# Patient Record
Sex: Male | Born: 1965 | Race: White | Hispanic: No | Marital: Married | Smoking: Former smoker
Health system: Southern US, Community
[De-identification: ages and names within clinical notes are randomized; demographics above are authoritative.]

## PROBLEM LIST (undated history)

## (undated) DIAGNOSIS — G4730 Sleep apnea, unspecified: Secondary | ICD-10-CM

## (undated) DIAGNOSIS — E119 Type 2 diabetes mellitus without complications: Principal | ICD-10-CM

## (undated) DIAGNOSIS — R519 Headache, unspecified: Secondary | ICD-10-CM

## (undated) DIAGNOSIS — I2510 Atherosclerotic heart disease of native coronary artery without angina pectoris: Secondary | ICD-10-CM

## (undated) DIAGNOSIS — Z86718 Personal history of other venous thrombosis and embolism: Secondary | ICD-10-CM

## (undated) DIAGNOSIS — C169 Malignant neoplasm of stomach, unspecified: Secondary | ICD-10-CM

## (undated) DIAGNOSIS — J45909 Unspecified asthma, uncomplicated: Secondary | ICD-10-CM

## (undated) DIAGNOSIS — I10 Essential (primary) hypertension: Secondary | ICD-10-CM

## (undated) DIAGNOSIS — I4819 Other persistent atrial fibrillation: Secondary | ICD-10-CM

## (undated) DIAGNOSIS — T7840XA Allergy, unspecified, initial encounter: Secondary | ICD-10-CM

## (undated) DIAGNOSIS — M1990 Unspecified osteoarthritis, unspecified site: Secondary | ICD-10-CM

## (undated) DIAGNOSIS — I499 Cardiac arrhythmia, unspecified: Secondary | ICD-10-CM

## (undated) DIAGNOSIS — I456 Pre-excitation syndrome: Secondary | ICD-10-CM

## (undated) DIAGNOSIS — R51 Headache: Secondary | ICD-10-CM

## (undated) DIAGNOSIS — I4891 Unspecified atrial fibrillation: Secondary | ICD-10-CM

## (undated) DIAGNOSIS — K219 Gastro-esophageal reflux disease without esophagitis: Secondary | ICD-10-CM

## (undated) DIAGNOSIS — E7800 Pure hypercholesterolemia, unspecified: Secondary | ICD-10-CM

## (undated) DIAGNOSIS — D649 Anemia, unspecified: Secondary | ICD-10-CM

## (undated) HISTORY — PX: TONSILLECTOMY: SUR1361

## (undated) HISTORY — PX: STOMACH SURGERY: SHX791

## (undated) HISTORY — DX: Malignant neoplasm of stomach, unspecified: C16.9

## (undated) HISTORY — PX: CORONARY ANGIOPLASTY WITH STENT PLACEMENT: SHX49

## (undated) HISTORY — DX: Allergy, unspecified, initial encounter: T78.40XA

## (undated) HISTORY — DX: Gastro-esophageal reflux disease without esophagitis: K21.9

## (undated) HISTORY — PX: NOSE SURGERY: SHX723

## (undated) HISTORY — DX: Unspecified asthma, uncomplicated: J45.909

## (undated) HISTORY — PX: CARDIAC SURGERY: SHX584

## (undated) HISTORY — PX: CORONARY ANGIOPLASTY: SHX604

## (undated) HISTORY — DX: Other persistent atrial fibrillation: I48.19

## (undated) HISTORY — PX: HERNIA REPAIR: SHX51

## (undated) HISTORY — PX: BACK SURGERY: SHX140

## (undated) HISTORY — PX: SPINE SURGERY: SHX786

## (undated) HISTORY — DX: Type 2 diabetes mellitus without complications: E11.9

---

## 1999-06-06 ENCOUNTER — Ambulatory Visit (HOSPITAL_COMMUNITY): Admission: RE | Admit: 1999-06-06 | Discharge: 1999-06-07 | Payer: Self-pay | Admitting: Internal Medicine

## 2003-08-15 ENCOUNTER — Observation Stay (HOSPITAL_COMMUNITY): Admission: RE | Admit: 2003-08-15 | Discharge: 2003-08-16 | Payer: Self-pay | Admitting: Specialist

## 2004-08-28 ENCOUNTER — Observation Stay (HOSPITAL_COMMUNITY): Admission: RE | Admit: 2004-08-28 | Discharge: 2004-08-29 | Payer: Self-pay | Admitting: Specialist

## 2012-03-09 ENCOUNTER — Encounter (HOSPITAL_COMMUNITY): Payer: Self-pay

## 2012-03-09 ENCOUNTER — Emergency Department (HOSPITAL_COMMUNITY)
Admission: EM | Admit: 2012-03-09 | Discharge: 2012-03-09 | Disposition: A | Discharge status: Home / Self Care | Payer: Worker's Compensation | Attending: Emergency Medicine | Admitting: Emergency Medicine

## 2012-03-09 ENCOUNTER — Emergency Department (HOSPITAL_COMMUNITY): Payer: Worker's Compensation

## 2012-03-09 DIAGNOSIS — X500XXA Overexertion from strenuous movement or load, initial encounter: Secondary | ICD-10-CM | POA: Insufficient documentation

## 2012-03-09 DIAGNOSIS — Y9289 Other specified places as the place of occurrence of the external cause: Secondary | ICD-10-CM | POA: Insufficient documentation

## 2012-03-09 DIAGNOSIS — E7800 Pure hypercholesterolemia, unspecified: Secondary | ICD-10-CM | POA: Insufficient documentation

## 2012-03-09 DIAGNOSIS — Y990 Civilian activity done for income or pay: Secondary | ICD-10-CM | POA: Insufficient documentation

## 2012-03-09 DIAGNOSIS — Y9389 Activity, other specified: Secondary | ICD-10-CM | POA: Insufficient documentation

## 2012-03-09 DIAGNOSIS — M84376A Stress fracture, unspecified foot, initial encounter for fracture: Secondary | ICD-10-CM

## 2012-03-09 DIAGNOSIS — I10 Essential (primary) hypertension: Secondary | ICD-10-CM | POA: Insufficient documentation

## 2012-03-09 HISTORY — DX: Essential (primary) hypertension: I10

## 2012-03-09 HISTORY — DX: Pure hypercholesterolemia, unspecified: E78.00

## 2012-03-09 MED ORDER — OXYCODONE-ACETAMINOPHEN 5-325 MG PO TABS: 1.0000 | ORAL_TABLET | ORAL | Status: AC | PRN

## 2012-03-09 MED ORDER — OXYCODONE-ACETAMINOPHEN 5-325 MG PO TABS
1.0000 | ORAL_TABLET | Freq: Once | ORAL | Status: AC
  Administered 2012-03-09: 1 via ORAL
  Filled 2012-03-09: qty 1

## 2012-03-09 NOTE — ED Notes (Signed)
Workers comp paperwork completed by July idol PA, pt walked to lab area to wait for UDS, lab made aware pt is waiting.

## 2012-03-09 NOTE — ED Notes (Signed)
Pt c/o foot pain after turning to reach for something. Pt felt a "pop" in foot and began to hurt.

## 2012-03-11 NOTE — ED Provider Notes (Signed)
History     CSN: [id number]  Arrival date & time 03/09/12  1956   First MD Initiated Contact with Patient 03/09/12 2026      Chief Complaint  Patient presents with  . Foot Injury    (Consider location/radiation/quality/duration/timing/severity/associated sxs/prior treatment) HPI Comments: Austin Woods presents with left foot pain after feeling a popping sensation in this foot after simply reaching and pivoting at work prior to arrival.    Patient is a 45 y.o. male presenting with foot injury. The history is provided by the patient.  Foot Injury  The incident occurred 1 to 2 hours ago. The incident occurred at work. There was no injury mechanism. The pain is present in the left foot. The quality of the pain is described as sharp and aching. The pain is at a severity of 8/10. The pain is moderate. The pain has been constant since onset. Pertinent negatives include no numbness, no inability to bear weight, no loss of sensation and no tingling. The symptoms are aggravated by bearing weight and palpation. He has tried nothing for the symptoms.    Past Medical History  Diagnosis Date  . Hypertension   . High cholesterol     Past Surgical History  Procedure Date  . Back surgery     No family history on file.  History  Substance Use Topics  . Smoking status: Never Smoker   . Smokeless tobacco: Not on file  . Alcohol Use: No      Review of Systems  Musculoskeletal: Positive for arthralgias. Negative for joint swelling.  Skin: Negative for wound.  Neurological: Negative for tingling, weakness and numbness.    Allergies  Review of patient's allergies indicates no known allergies.  Home Medications   Current Outpatient Rx  Name  Route  Sig  Dispense  Refill  . OXYCODONE-ACETAMINOPHEN 5-325 MG PO TABS   Oral   Take 1 tablet by mouth every 4 (four) hours as needed for pain.   20 tablet   0     BP 134/79  Pulse 60  Temp 98.3 F (36.8 C) (Oral)  Resp 20  Ht 6'  1" (1.854 m)  Wt 280 lb (127.007 kg)  BMI 36.94 kg/m2  SpO2 97%  Physical Exam  Constitutional: He appears well-developed and well-nourished.  HENT:  Head: Atraumatic.  Neck: Normal range of motion.  Cardiovascular:       Pulses equal bilaterally  Musculoskeletal: He exhibits tenderness.       Left foot: He exhibits bony tenderness. He exhibits normal capillary refill and no deformity.       Feet:  Neurological: He is alert. He has normal strength. He displays normal reflexes. No sensory deficit.       Equal strength  Skin: Skin is warm and dry.  Psychiatric: He has a normal mood and affect.    ED Course  Procedures (including critical care time)  Labs Reviewed - No data to display No results found.   1. Metatarsal stress fracture       MDM  Patients labs and/or radiological studies were reviewed during the medical decision making and disposition process.  Pt placed in posterior splint. Crutches given.  Cap refill rechecked after splint applied, remains adequate.  Pain improved.  Oxycodone prescribed.  Referral to Dr Romeo Apple. Pt to call in am for appt.        Burgess Amor, [place] 03/11/12 2231

## 2012-03-13 NOTE — ED Provider Notes (Signed)
Medical screening examination/treatment/procedure(s) were performed by non-physician practitioner and as supervising physician I was immediately available for consultation/collaboration.   Joya Gaskins, MD 03/13/12 [phone]

## 2012-08-05 ENCOUNTER — Observation Stay (HOSPITAL_COMMUNITY)
Admission: EM | Admit: 2012-08-05 | Discharge: 2012-08-06 | Disposition: A | Discharge status: Home / Self Care | Payer: BC Managed Care – PPO | Attending: Cardiology | Admitting: Cardiology

## 2012-08-05 ENCOUNTER — Emergency Department (HOSPITAL_COMMUNITY): Payer: BC Managed Care – PPO

## 2012-08-05 ENCOUNTER — Encounter: Payer: Self-pay | Admitting: Nurse Practitioner

## 2012-08-05 ENCOUNTER — Encounter (HOSPITAL_COMMUNITY): Payer: Self-pay | Admitting: Emergency Medicine

## 2012-08-05 ENCOUNTER — Ambulatory Visit (INDEPENDENT_AMBULATORY_CARE_PROVIDER_SITE_OTHER): Payer: BC Managed Care – PPO | Admitting: Nurse Practitioner

## 2012-08-05 VITALS — BP 138/82 | HR 70 | Wt 284.0 lb

## 2012-08-05 DIAGNOSIS — M7989 Other specified soft tissue disorders: Secondary | ICD-10-CM | POA: Insufficient documentation

## 2012-08-05 DIAGNOSIS — R002 Palpitations: Secondary | ICD-10-CM | POA: Insufficient documentation

## 2012-08-05 DIAGNOSIS — Z86718 Personal history of other venous thrombosis and embolism: Secondary | ICD-10-CM | POA: Insufficient documentation

## 2012-08-05 DIAGNOSIS — E669 Obesity, unspecified: Secondary | ICD-10-CM | POA: Insufficient documentation

## 2012-08-05 DIAGNOSIS — R609 Edema, unspecified: Secondary | ICD-10-CM

## 2012-08-05 DIAGNOSIS — I4891 Unspecified atrial fibrillation: Principal | ICD-10-CM

## 2012-08-05 DIAGNOSIS — I10 Essential (primary) hypertension: Secondary | ICD-10-CM | POA: Insufficient documentation

## 2012-08-05 DIAGNOSIS — I456 Pre-excitation syndrome: Secondary | ICD-10-CM | POA: Insufficient documentation

## 2012-08-05 DIAGNOSIS — I509 Heart failure, unspecified: Secondary | ICD-10-CM

## 2012-08-05 HISTORY — DX: Personal history of other venous thrombosis and embolism: Z86.718

## 2012-08-05 HISTORY — DX: Pre-excitation syndrome: I45.6

## 2012-08-05 HISTORY — DX: Sleep apnea, unspecified: G47.30

## 2012-08-05 LAB — COMPREHENSIVE METABOLIC PANEL
AST: 27 U/L (ref 0–37)
Alkaline Phosphatase: 52 U/L (ref 39–117)
Calcium: 9.2 mg/dL (ref 8.4–10.5)
Creatinine, Ser: 1.05 mg/dL (ref 0.50–1.35)
Glucose, Bld: 120 mg/dL — ABNORMAL HIGH (ref 70–99)
Potassium: 4 mEq/L (ref 3.5–5.1)
Total Bilirubin: 0.8 mg/dL (ref 0.3–1.2)

## 2012-08-05 LAB — TSH: TSH: 1.596 u[IU]/mL (ref 0.350–4.500)

## 2012-08-05 LAB — URINE MICROSCOPIC-ADD ON

## 2012-08-05 LAB — URINALYSIS, ROUTINE W REFLEX MICROSCOPIC
Glucose, UA: NEGATIVE mg/dL
Specific Gravity, Urine: 1.025 (ref 1.005–1.030)
pH: 6 (ref 5.0–8.0)

## 2012-08-05 LAB — CBC WITH DIFFERENTIAL/PLATELET
Basophils Absolute: 0 10*3/uL (ref 0.0–0.1)
Basophils Relative: 0 % (ref 0–1)
Eosinophils Relative: 1 % (ref 0–5)
HCT: 42.1 % (ref 39.0–52.0)
Monocytes Relative: 9 % (ref 3–12)
Neutro Abs: 4.6 10*3/uL (ref 1.7–7.7)
Neutrophils Relative %: 65 % (ref 43–77)

## 2012-08-05 LAB — PRO B NATRIURETIC PEPTIDE: Pro B Natriuretic peptide (BNP): 707.7 pg/mL — ABNORMAL HIGH (ref 0–125)

## 2012-08-05 LAB — PROTIME-INR
INR: 1.06 (ref 0.00–1.49)
Prothrombin Time: 13.7 seconds (ref 11.6–15.2)

## 2012-08-05 LAB — TROPONIN I: Troponin I: <0.3 ng/mL (ref <0.30)

## 2012-08-05 MED ORDER — SODIUM CHLORIDE 0.9 % IJ SOLN: 3.0000 mL | INTRAMUSCULAR | Status: DC | PRN

## 2012-08-05 MED ORDER — ATORVASTATIN CALCIUM 10 MG PO TABS
10.0000 mg | ORAL_TABLET | Freq: Every day | ORAL | Status: DC
  Administered 2012-08-05: 10 mg via ORAL
  Filled 2012-08-05 (×2): qty 1

## 2012-08-05 MED ORDER — DILTIAZEM HCL 25 MG/5ML IV SOLN
20.0000 mg | Freq: Once | INTRAVENOUS | Status: AC
  Administered 2012-08-05: 20 mg via INTRAVENOUS
  Filled 2012-08-05: qty 5

## 2012-08-05 MED ORDER — NITROGLYCERIN 0.4 MG SL SUBL: 0.4000 mg | SUBLINGUAL_TABLET | SUBLINGUAL | Status: DC | PRN

## 2012-08-05 MED ORDER — MOMETASONE FURO-FORMOTEROL FUM 100-5 MCG/ACT IN AERO
2.0000 | INHALATION_SPRAY | Freq: Two times a day (BID) | RESPIRATORY_TRACT | Status: DC
  Administered 2012-08-05 – 2012-08-06 (×2): 2 via RESPIRATORY_TRACT
  Filled 2012-08-05: qty 8.8

## 2012-08-05 MED ORDER — HEPARIN (PORCINE) IN NACL 100-0.45 UNIT/ML-% IJ SOLN
1550.0000 [IU]/h | INTRAMUSCULAR | Status: DC
  Filled 2012-08-05: qty 250

## 2012-08-05 MED ORDER — ALBUTEROL SULFATE HFA 108 (90 BASE) MCG/ACT IN AERS: 2.0000 | INHALATION_SPRAY | RESPIRATORY_TRACT | Status: DC | PRN

## 2012-08-05 MED ORDER — ACETAMINOPHEN 325 MG PO TABS: 650.0000 mg | ORAL_TABLET | ORAL | Status: DC | PRN

## 2012-08-05 MED ORDER — OXYBUTYNIN CHLORIDE 5 MG PO TABS
5.0000 mg | ORAL_TABLET | Freq: Two times a day (BID) | ORAL | Status: DC
  Administered 2012-08-05 – 2012-08-06 (×2): 5 mg via ORAL
  Filled 2012-08-05 (×3): qty 1

## 2012-08-05 MED ORDER — ZOLPIDEM TARTRATE 5 MG PO TABS: 5.0000 mg | ORAL_TABLET | Freq: Every evening | ORAL | Status: DC | PRN

## 2012-08-05 MED ORDER — SODIUM CHLORIDE 0.9 % IV SOLN: 250.0000 mL | INTRAVENOUS | Status: DC | PRN

## 2012-08-05 MED ORDER — RIVAROXABAN 20 MG PO TABS
20.0000 mg | ORAL_TABLET | Freq: Every day | ORAL | Status: DC
  Administered 2012-08-05: 20 mg via ORAL
  Filled 2012-08-05 (×2): qty 1

## 2012-08-05 MED ORDER — ENALAPRIL MALEATE 20 MG PO TABS
20.0000 mg | ORAL_TABLET | Freq: Two times a day (BID) | ORAL | Status: DC
  Administered 2012-08-05 – 2012-08-06 (×2): 20 mg via ORAL
  Filled 2012-08-05 (×3): qty 1

## 2012-08-05 MED ORDER — PANTOPRAZOLE SODIUM 40 MG PO TBEC
40.0000 mg | DELAYED_RELEASE_TABLET | Freq: Every day | ORAL | Status: DC
  Administered 2012-08-05: 40 mg via ORAL
  Filled 2012-08-05: qty 1

## 2012-08-05 MED ORDER — SODIUM CHLORIDE 0.9 % IJ SOLN
3.0000 mL | Freq: Two times a day (BID) | INTRAMUSCULAR | Status: DC
  Administered 2012-08-05 – 2012-08-06 (×2): 3 mL via INTRAVENOUS

## 2012-08-05 MED ORDER — ONDANSETRON HCL 4 MG/2ML IJ SOLN: 4.0000 mg | Freq: Three times a day (TID) | INTRAMUSCULAR | Status: DC | PRN

## 2012-08-05 MED ORDER — SODIUM CHLORIDE 0.9 % IV SOLN
INTRAVENOUS | Status: AC
  Administered 2012-08-05: 18:00:00 via INTRAVENOUS

## 2012-08-05 MED ORDER — ALPRAZOLAM 0.25 MG PO TABS: 0.2500 mg | ORAL_TABLET | Freq: Two times a day (BID) | ORAL | Status: DC | PRN

## 2012-08-05 MED ORDER — FUROSEMIDE 10 MG/ML IJ SOLN
40.0000 mg | Freq: Once | INTRAMUSCULAR | Status: AC
  Administered 2012-08-05: 40 mg via INTRAVENOUS
  Filled 2012-08-05: qty 4

## 2012-08-05 MED ORDER — ONDANSETRON HCL 4 MG/2ML IJ SOLN: 4.0000 mg | Freq: Four times a day (QID) | INTRAMUSCULAR | Status: DC | PRN

## 2012-08-05 MED ORDER — HEPARIN BOLUS VIA INFUSION
4000.0000 [IU] | Freq: Once | INTRAVENOUS | Status: AC
  Administered 2012-08-05: 4000 [IU] via INTRAVENOUS

## 2012-08-05 MED ORDER — DILTIAZEM HCL 30 MG PO TABS
30.0000 mg | ORAL_TABLET | Freq: Four times a day (QID) | ORAL | Status: DC
  Administered 2012-08-05 – 2012-08-06 (×4): 30 mg via ORAL
  Filled 2012-08-05 (×7): qty 1

## 2012-08-05 MED ORDER — HEPARIN (PORCINE) IN NACL 100-0.45 UNIT/ML-% IJ SOLN
12.0000 [IU]/kg/h | INTRAMUSCULAR | Status: DC
  Administered 2012-08-05: 12 [IU]/kg/h via INTRAVENOUS
  Filled 2012-08-05: qty 250

## 2012-08-05 NOTE — ED Notes (Signed)
Pt c/o ble swelling since Wednesday with some sob (pt states he thinks sob may be sinus congestion). Pt seen at Dr.Lukings office this am and sent to ED for A fib with rvr. Pt denies cp, states chest is sore from coughing.  nad noted.

## 2012-08-05 NOTE — H&P (Signed)
CARDIOLOGY ADMISSION NOTE  Patient ID: Austin Woods MRN: [id number] DOB/AGE: 06/28/1965 47 y.o.  Admit date: 08/05/2012 Primary Physician   Harlow Asa, MD Primary Cardiologist  Dr. Graciela Husbands Chief Complaint    Atrial fibrillation  HPI:   47 year old male with prior history of Wolff-Parkinson-White syndrome status post radiofrequency catheter ablation in 2001. Over the years, he has done reasonably well without recurrence of palpitations. He does have a history of hypertension and hyperlipidemia which is treated and followed by his primary care provider. Patient been working night shift at [employer] over the past 2 years and in doing so says that his sleep is not great. He often feels sluggish and tired throughout the day and even when he's at work at night. That said, he does not generally experience dyspnea exertion, chest pain, PND, orthopnea, dizziness, syncope, edema, or early satiety.  Approximately 2 weeks ago, he was seen for a routine physical on his work site and was told by the physician assistant that his heart rate was fast and irregular and it was recommended that he followup with his primary care provider. He did not necessarily notice tachypalpitations. He schedule an appointment to be seen by his primary care provider today and after an ECG was performed, he was noted to be in atrial fibrillation with rapid ventricular response. He was sent to the [facility] emergency department for further evaluation and subsequently transferred here for cardiology admission. Of note, his BNP was mildly elevated at [facility] at 707.7, and chest x-ray showed cardiomegaly with central vascular congestion. He was given a dose of IV Lasix as well as a 20 mg IV diltiazem bolus with improvement in rate control prior to leaving [employer]. Currently, he is symptom-free. He remained in atrial fibrillation with rates in the low 100s.  Past Medical History  Diagnosis Date  . Hypertension   . High  cholesterol   . Asthma   . GERD (gastroesophageal reflux disease)   . History of DVT (deep vein thrombosis)     a. left leg  . Wolff-Parkinson-White (WPW) syndrome     a. s/p rfca 2001  . Atrial fibrillation     a. Dx 08/2012;  b. CHA2DS2 VASc = 1    Past Surgical History  Procedure Laterality Date  . Back surgery    . Tonsillectomy    . Stomach surgery      reflux  . Cardiac surgery    . Nose surgery    . Ablation      No Known Allergies No current facility-administered medications on file prior to encounter.   Current Outpatient Prescriptions on File Prior to Encounter  Medication Sig Dispense Refill  . amLODipine (NORVASC) 5 MG tablet Take 5 mg by mouth daily.      Marland Kitchen aspirin 81 MG tablet Take 81 mg by mouth daily.      Marland Kitchen atorvastatin (LIPITOR) 10 MG tablet Take 10 mg by mouth daily.      . Cholecalciferol (VITAMIN D) 2000 UNITS CAPS Take by mouth daily.      . enalapril (VASOTEC) 20 MG tablet Take 20 mg by mouth 2 (two) times daily.      . Fluticasone-Salmeterol (ADVAIR DISKUS) 250-50 MCG/DOSE AEPB Inhale 1 puff into the lungs every 12 (twelve) hours.      . meloxicam (MOBIC) 15 MG tablet Take 15 mg by mouth daily.      Marland Kitchen omeprazole (PRILOSEC) 20 MG capsule Take 20 mg by mouth daily.      Marland Kitchen  oxybutynin (DITROPAN) 5 MG tablet Take 5 mg by mouth 2 (two) times daily.      . tadalafil (CIALIS) 5 MG tablet Take 5 mg by mouth daily as needed for erectile dysfunction.      . Albuterol Sulfate (PROVENTIL HFA IN) Inhale into the lungs. Inhale two puffs po q 4 hours prn      . Vitamin D, Ergocalciferol, (DRISDOL) 50000 UNITS CAPS Take 50,000 Units by mouth every 7 (seven) days. Takes every Saturday       History   Social History  . Marital Status: Married    Spouse Name: N/A    Number of Children: N/A  . Years of Education: N/A   Occupational History  . Not on file.   Social History Main Topics  . Smoking status: Former Smoker    Quit date: 08/06/2010  . Smokeless tobacco:  Not on file  . Alcohol Use: No  . Drug Use: No  . Sexually Active: Not on file   Other Topics Concern  . Not on file   Social History Narrative  . No narrative on file    No family history on file.   ROS:  He has had seasonal allergies with upper is 3 congestion. He did use one dose of Sudafed earlier this week but was been told not to use this in the setting of tachycardia palpitations. Otherwise as stated in the HPI and negative for all other systems.  Physical Exam: Blood pressure 125/82, pulse 70, temperature 97.6 F (36.4 C), temperature source Oral, resp. rate 19, height 6\' 1"  (1.854 m), weight 270 lb 6.4 oz (122.653 kg), SpO2 94.00%.  GENERAL:  Well appearing HEENT:  Pupils equal round and reactive, fundi not visualized, oral mucosa unremarkable NECK:  No jugular venous distention, waveform within normal limits, carotid upstroke brisk and symmetric, no bruits, no thyromegaly LYMPHATICS:  No cervical, inguinal adenopathy LUNGS:  Clear to auscultation bilaterally BACK:  No CVA tenderness CHEST:  Unremarkable HEART:  PMI not displaced or sustained,S1 and S2 within normal limits, no S3, no S4, no clicks, no rubs, no murmurs ABD:  Flat, positive bowel sounds normal in frequency in pitch, no bruits, no rebound, no guarding, no midline pulsatile mass, no hepatomegaly, no splenomegaly EXT:  2 plus pulses throughout, no edema, no cyanosis no clubbing SKIN:  No rashes no nodules NEURO:  Cranial nerves II through XII grossly intact, motor grossly intact throughout PSYCH:  Cognitively intact, oriented to person place and time  Labs: Lab Results  Component Value Date   BUN 16 08/05/2012   Lab Results  Component Value Date   CREATININE 1.05 08/05/2012   Lab Results  Component Value Date   NA 139 08/05/2012   K 4.0 08/05/2012   CL 103 08/05/2012   CO2 26 08/05/2012   Lab Results  Component Value Date   TROPONINI <0.30 08/05/2012   Lab Results  Component Value Date   WBC 7.1 08/05/2012     HGB 14.6 08/05/2012   HCT 42.1 08/05/2012   MCV 91.5 08/05/2012   PLT 215 08/05/2012    Lab Results  Component Value Date   ALT 24 08/05/2012   AST 27 08/05/2012   ALKPHOS 52 08/05/2012   BILITOT 0.8 08/05/2012     Radiology:  CXR:  Moderate cardiomegaly with central vascular congestion. No acute  finding.  EKG:  Atrial fibrillation, rate 108 with PVC, axis WNL, inervals WNL, no acute ST T wave changes.   ASSESSMENT AND  PLAN:    ATRIAL FIBRILLATION:  He has been in this rhythm for at least two weeks and probably longer.  We will observe overnight.  His heart rate is currently reasonable after treatment in the ER .  He can be started on PO cardizem, we will start Xarelto.  I would plan cardioversion in 3 weeks. We will check an echocardiogram and a TSH.  CHF:  He had slight volume overload on CXR.  Pro BNP was very slightly elevated.  He was given a dose of Lasix in the ER.  As above we will check an echocardiogram.  I will continue the ACE inhibitor.  HTN:  Hold Norvasc and change to Cardizem.  Continue ACE inhibitor.    SignedDarrnell Mangiaracina 08/05/2012, 5:50 PM

## 2012-08-05 NOTE — ED Notes (Signed)
[facility] here to transport pt 

## 2012-08-05 NOTE — ED Provider Notes (Signed)
History     This chart was scribed for Austin Roller, MD, MD by Smitty Pluck, ED Scribe. The patient was seen in room APA18/APA18 and the patient's care was started at 11:26 AM.   CSN: [id number]  Arrival date & time 08/05/12  1100      Chief Complaint  Patient presents with  . Palpitations     The history is provided by the patient and medical records. No language interpreter was used.   Austin Woods is a 47 y.o. male with hx of DVT in left leg, HTN and high cholesterol who presents to the Emergency Department sent here from PCP office Lubertha South MD) today due to bilateral leg swelling onset 2 days ago, persistent and gradually worsening. He states he noticed the swelling after coming home from work (stands for long periods of time). He reports hx of palpitations and that he had Evelene Croon Parkinson's that he had surgery for many years ago. The palpitations have been presents for years according to the patient, not a recent onset.  Pt denies chest pain, numbness, fever, chills, nausea, vomiting, diarrhea, weakness, cough, SOB and any other pain.  He reports that he had fracture in left foot 7 months ago. He has no other stimulant exposures such as ETOH, Tob, Drugs of abuse, energy drink use.  He has no significant cough or SOB.  He stopped smoking cigarettes 2 years ago. He drinks alcohol occasionally.   Past Medical History  Diagnosis Date  . Hypertension   . High cholesterol   . Asthma   . GERD (gastroesophageal reflux disease)   . H/O blood clots     left leg  . Wolff-Parkinson-White (WPW) syndrome     Past Surgical History  Procedure Laterality Date  . Back surgery    . Tonsillectomy    . Stomach surgery      reflux  . Cardiac surgery    . Nose surgery      No family history on file.  History  Substance Use Topics  . Smoking status: Former Smoker    Quit date: 08/06/2010  . Smokeless tobacco: Not on file  . Alcohol Use: No      Review of Systems   Constitutional: Negative for fever and chills.  Cardiovascular: Positive for palpitations and leg swelling. Negative for chest pain.  All other systems reviewed and are negative.    Allergies  Review of patient's allergies indicates no known allergies.  Home Medications   Current Outpatient Rx  Name  Route  Sig  Dispense  Refill  . amLODipine (NORVASC) 5 MG tablet   Oral   Take 5 mg by mouth daily.         Marland Kitchen aspirin 81 MG tablet   Oral   Take 81 mg by mouth daily.         Marland Kitchen atorvastatin (LIPITOR) 10 MG tablet   Oral   Take 10 mg by mouth daily.         . Cholecalciferol (VITAMIN D) 2000 UNITS CAPS   Oral   Take by mouth daily.         . enalapril (VASOTEC) 20 MG tablet   Oral   Take 20 mg by mouth 2 (two) times daily.         . Fluticasone-Salmeterol (ADVAIR DISKUS) 250-50 MCG/DOSE AEPB   Inhalation   Inhale 1 puff into the lungs every 12 (twelve) hours.         . meloxicam (  MOBIC) 15 MG tablet   Oral   Take 15 mg by mouth daily.         Marland Kitchen omeprazole (PRILOSEC) 20 MG capsule   Oral   Take 20 mg by mouth daily.         Marland Kitchen oxybutynin (DITROPAN) 5 MG tablet   Oral   Take 5 mg by mouth 2 (two) times daily.         . tadalafil (CIALIS) 5 MG tablet   Oral   Take 5 mg by mouth daily as needed for erectile dysfunction.         . Albuterol Sulfate (PROVENTIL HFA IN)   Inhalation   Inhale into the lungs. Inhale two puffs po q 4 hours prn         . Vitamin D, Ergocalciferol, (DRISDOL) 50000 UNITS CAPS   Oral   Take 50,000 Units by mouth every 7 (seven) days. Takes every Saturday           BP 137/99  Pulse 122  Temp(Src) 98.6 F (37 C)  Resp 18  Ht 6\' 1"  (1.854 m)  Wt 284 lb (128.822 kg)  BMI 37.48 kg/m2  SpO2 97%  Physical Exam  Nursing note and vitals reviewed. Constitutional: He is oriented to person, place, and time. He appears well-developed and well-nourished. No distress.  HENT:  Head: Normocephalic and atraumatic.   Eyes: EOM are normal. Pupils are equal, round, and reactive to light.  Neck: Normal range of motion. Neck supple. No tracheal deviation present.  Cardiovascular: S1 normal and S2 normal.  An irregular rhythm present. Tachycardia present.   No murmur heard. Nl pulses in feet  Pulmonary/Chest: Effort normal and breath sounds normal. No respiratory distress. He has no rales.  Abdominal: Soft. He exhibits no distension.  Musculoskeletal: Normal range of motion. He exhibits edema.  Symmetrical +2 pitting bilateral lower extremities      Neurological: He is alert and oriented to person, place, and time.  Skin: Skin is warm and dry.  Psychiatric: He has a normal mood and affect. His behavior is normal.    ED Course  Procedures (including critical care time) DIAGNOSTIC STUDIES: Oxygen Saturation is 97% on room air, normal by my interpretation.    COORDINATION OF CARE: 11:30 AM Discussed ED treatment with pt and pt agrees.  Medications  furosemide (LASIX) injection 40 mg (not administered)  diltiazem (CARDIZEM) injection 20 mg (0 mg Intravenous Stopped 08/05/12 1221)      Labs Reviewed  COMPREHENSIVE METABOLIC PANEL - Abnormal; Notable for the following:    Glucose, Bld 120 (*)    GFR calc non Af Amer 82 (*)    All other components within normal limits  URINALYSIS, ROUTINE W REFLEX MICROSCOPIC - Abnormal; Notable for the following:    Hgb urine dipstick TRACE (*)    All other components within normal limits  PRO B NATRIURETIC PEPTIDE - Abnormal; Notable for the following:    Pro B Natriuretic peptide (BNP) 707.7 (*)    All other components within normal limits  CBC WITH DIFFERENTIAL  TROPONIN I  APTT  PROTIME-INR  URINE MICROSCOPIC-ADD ON  TSH   Dg Chest 2 View  08/05/2012  *RADIOLOGY REPORT*  Clinical Data: Palpitations, hypertension  CHEST - 2 VIEW  Comparison: 08/13/2003  Findings: Moderate enlargement cardiomediastinal silhouette is noted.  Mild central vascular congestion  without overt edema.  No focal pulmonary opacity.  No pleural effusion.  No acute osseous finding.  IMPRESSION: Moderate cardiomegaly  with central vascular congestion.  No acute finding.   Original Report Authenticated By: Christiana Pellant, M.D.      1. Atrial fibrillation with RVR   2. CHF (congestive heart failure)       MDM  The patient presents with acute onset of lower strumming swelling associated with tachycardia and irregular rhythm consistent with new onset atrial fibrillation. Clinically the patient's house that he may have had atrial fibrillation for many years though it is unpredictable at this time. He has no signs of pulmonary edema but does have bilateral peripheral edema which could be related to his heart or also could be related to Norvasc which he takes for antihypertensives, would also consider venous thrombosis however bilateral DVT would be unlikely. Initiated lab workup, chest x-ray, anticipate admission to the hospital as the patient in nature fibrillation has a CHADS2 risk score of 3 indicating moderate to high risk for thromboembolism and need for anticoagulation.  ED ECG REPORT  I personally interpreted this EKG   Date: 08/05/2012   Rate: 113  Rhythm: atrial fibrillation with RVR and PVC's  QRS Axis: normal  Intervals: normal  ST/T Wave abnormalities: nonspecific T wave changes  Conduction Disutrbances:none  Narrative Interpretation:   Old EKG Reviewed: none available  Chest x-ray reveals pulmonary venous congestion and cardiomegaly consistent with patient's history of atrial fibrillation and what appears to be new onset congestive heart failure. BNP is greater than 700, all other lab work at this time is pretty normal.  The patient required Cardizem for rate control but will require anticoagulation, heparin started, critical care being provided. I suspect that the atrial fibrillation was the source of the peripheral edema and onset of CHF.  D/w hospitalist -  requests cardiology  D/w Dr. Antoine Poche who has accepted the pt in transfer.  CRITICAL CARE Performed by: Austin Woods   Total critical care time: 35  Critical care time was exclusive of separately billable procedures and treating other patients.  Critical care was necessary to treat or prevent imminent or life-threatening deterioration.  Critical care was time spent personally by me on the following activities: development of treatment plan with patient and/or surrogate as well as nursing, discussions with consultants, evaluation of patient's response to treatment, examination of patient, obtaining history from patient or surrogate, ordering and performing treatments and interventions, ordering and review of laboratory studies, ordering and review of radiographic studies, pulse oximetry and re-evaluation of patient's condition.    I personally performed the services described in this documentation, which was scribed in my presence. The recorded information has been reviewed and is accurate.      Austin Roller, MD 08/05/12 1409

## 2012-08-05 NOTE — ED Notes (Signed)
Per previous RN, report has been called, [facility] en route

## 2012-08-06 DIAGNOSIS — I509 Heart failure, unspecified: Secondary | ICD-10-CM

## 2012-08-06 LAB — LIPID PANEL
Cholesterol: 143 mg/dL (ref 0–200)
HDL: 31 mg/dL — ABNORMAL LOW (ref >39)
Triglycerides: 185 mg/dL — ABNORMAL HIGH (ref <150)

## 2012-08-06 LAB — CBC
Hemoglobin: 14.1 g/dL (ref 13.0–17.0)
MCH: 31.5 pg (ref 26.0–34.0)
Platelets: 210 10*3/uL (ref 150–400)
RBC: 4.48 MIL/uL (ref 4.22–5.81)
WBC: 7.6 10*3/uL (ref 4.0–10.5)

## 2012-08-06 LAB — TSH: TSH: 1.24 u[IU]/mL (ref 0.350–4.500)

## 2012-08-06 LAB — MAGNESIUM: Magnesium: 2.1 mg/dL (ref 1.5–2.5)

## 2012-08-06 MED ORDER — DILTIAZEM HCL ER COATED BEADS 120 MG PO CP24: 120.0000 mg | ORAL_CAPSULE | Freq: Every day | ORAL | Status: DC

## 2012-08-06 MED ORDER — RIVAROXABAN 20 MG PO TABS: 20.0000 mg | ORAL_TABLET | Freq: Every day | ORAL | Status: DC

## 2012-08-06 MED ORDER — DILTIAZEM HCL ER COATED BEADS 120 MG PO CP24
120.0000 mg | ORAL_CAPSULE | Freq: Every day | ORAL | Status: DC
  Filled 2012-08-06: qty 1

## 2012-08-06 NOTE — Progress Notes (Signed)
Primary electrophysiologist: Dr. Sherryl Manges  Subjective:   Does not feel any palpitations or chest pain. Has mild ankle edema as before. Otherwise not breathless, states he is ready to go home.   Objective:   Temp:  [97.3 F (36.3 C)-98.6 F (37 C)] 97.3 F (36.3 C) (05/03 0451) Pulse Rate:  [62-122] 65 (05/03 0451) Resp:  [15-22] 18 (05/03 0451) BP: (114-138)/(62-99) 119/74 mmHg (05/03 0451) SpO2:  [94 %-100 %] 97 % (05/03 0901) Weight:  [270 lb 4.8 oz (122.607 kg)-284 lb (128.822 kg)] 270 lb 4.8 oz (122.607 kg) (05/03 0451) Last BM Date: 08/04/12  Filed Weights   08/05/12 1110 08/05/12 1635 08/06/12 0451  Weight: 284 lb (128.822 kg) 270 lb 6.4 oz (122.653 kg) 270 lb 4.8 oz (122.607 kg)    Intake/Output Summary (Last 24 hours) at 08/06/12 0913 Last data filed at 08/06/12 0543  Gross per 24 hour  Intake    902 ml  Output   2600 ml  Net  -1698 ml   Telemetry: Rate-controlled atrial fibrillation.  Exam:  General: Obese male no acute distress.  Lungs: Decreased breath sounds but clear.  Cardiac: Irregularly irregular, no gallop.  Extremities: Mild ankle edema.  Lab Results:  Basic Metabolic Panel:  Recent Labs Lab 08/05/12 1150 08/06/12 0447  NA 139  --   K 4.0  --   CL 103  --   CO2 26  --   GLUCOSE 120*  --   BUN 16  --   CREATININE 1.05  --   CALCIUM 9.2  --   MG  --  2.1    Liver Function Tests:  Recent Labs Lab 08/05/12 1150  AST 27  ALT 24  ALKPHOS 52  BILITOT 0.8  PROT 7.2  ALBUMIN 4.3    CBC:  Recent Labs Lab 08/05/12 1150 08/06/12 0447  WBC 7.1 7.6  HGB 14.6 14.1  HCT 42.1 40.6  MCV 91.5 90.6  PLT 215 210    Cardiac Enzymes:  Recent Labs Lab 08/05/12 1150  TROPONINI <0.30    BNP:  Recent Labs  08/05/12 1150  PROBNP 707.7*    Coagulation:  Recent Labs Lab 08/05/12 1150  INR 1.06    ECG: Atrial fibrillation with nonspecific ST-T changes.   Medications:   Scheduled Medications: . atorvastatin   10 mg Oral q1800  . diltiazem  30 mg Oral Q6H  . enalapril  20 mg Oral BID  . mometasone-formoterol  2 puff Inhalation BID  . oxybutynin  5 mg Oral BID  . pantoprazole  40 mg Oral Q1200  . rivaroxaban  20 mg Oral Q supper  . sodium chloride  3 mL Intravenous Q12H      PRN Medications:  sodium chloride, acetaminophen, albuterol, ALPRAZolam, nitroGLYCERIN, ondansetron (ZOFRAN) IV, sodium chloride, zolpidem   Assessment:   1. Newly diagnosed atrial fibrillation, possibly at least a few weeks duration. No evidence of ACS. Heart rate better controlled on Cardizem, started on Xarelto by Dr. Antoine Poche yesterday with plan for outpatient cardioversion.  2. Possible mild volume overload by chest x-ray and proBNP, given dose of Lasix in ED. Cardiomegaly by chest x-ray. No recent assessment of LVEF.  3. History of Wolff-Parkinson-White syndrome status post RFA 2001.  Plan/Discussion:    Change to Cardizem CD 120 mg daily and continue Xarelto. Plan discharge home today, Will schedule an outpatient echocardiogram through the [facility] office (patient lives in [place]). He will then need followup with Dr. Antoine Poche within the next  2 weeks, can determine whether DCCV needs to be pursued at that time and also whether medication adjustments are necessary based on cardiac structure and function.   Jonelle Sidle, M.D., F.A.C.C.

## 2012-08-06 NOTE — Discharge Summary (Signed)
CARDIOLOGY DISCHARGE SUMMARY    Patient ID: Austin Woods,  MRN: [id number], DOB/AGE: 1966-01-05 47 y.o.  Admit date: 08/05/2012 Discharge date: 08/06/2012  Primary Care Physician: Austin Royal. Gerda Diss, MD Primary Cardiologist: Austin Husbands, MD  Primary Discharge Diagnosis:  1. Atrial fibrillation, newly diagnosed, duration unknown - continue diltiazem for rate control and Xarelto for embolic prophylaxis; plan for DCCV in 3 weeks  Secondary Discharge Diagnoses:  1. History of WPW s/p RF ablation 2001 2. HTN 3. Dyslipidemia 4. Asthma 5. GERD 6. History of DVT  Procedures This Admission:  None   History:  Austin Woods is a 47 year old man with history of Wolff-Parkinson-White syndrome status post RF ablation in 2001 who, approximately 2 weeks ago, was seen for a routine physical at his work site and was told by the physician assistant that his heart rate was fast and irregular. It was recommended that he followup with his primary care provider. He did not have CP, SOB or palpitations. He scheduled an appointment with his primary care provider and after an ECG was performed, he was found to have atrial fibrillation with rapid ventricular response. He was then sent to the [facility] ED for further evaluation and was subsequently transferred to [facility] for evaluation and treatment. Of note, his BNP was mildly elevated at [facility] at 708 and his chest x-ray showed cardiomegaly with central vascular congestion. He was given a dose of IV Lasix as well as a 20 mg IV diltiazem bolus with improvement in his rate prior to leaving [employer].  Hospital Course: Austin Woods was admitted to [facility] on 08/05/2012. He was given PO diltiazem with improved rate control. His CHADS2-VASc score = 1 but Xarelto was added in preparation for outpatient DCCV in 3 weeks. Of note, Norvasc was discontinued to allow for initiation of rate control with diltiazem. Austin Woods remains hemodynamically stable and adequately rate controlled. He  has been seen, examined and deemed stable for discharge home today by Austin Woods. He will have an echocardiogram done at our office in [place] as an outpatient. He will return for follow-up with Austin Woods in 2 weeks to discuss echo results and DCCV.  Discharge Vitals: Blood pressure 119/74, pulse 65, temperature 97.3 F (36.3 C), temperature source Oral, resp. rate 18, height 6\' 1"  (1.854 m), weight 270 lb 4.8 oz (122.607 kg), SpO2 97.00%.   Labs: Lab Results  Component Value Date   WBC 7.6 08/06/2012   HGB 14.1 08/06/2012   HCT 40.6 08/06/2012   MCV 90.6 08/06/2012   PLT 210 08/06/2012     Recent Labs Lab 08/05/12 1150  NA 139  K 4.0  CL 103  CO2 26  BUN 16  CREATININE 1.05  CALCIUM 9.2  PROT 7.2  BILITOT 0.8  ALKPHOS 52  ALT 24  AST 27  GLUCOSE 120*   Lab Results  Component Value Date   TROPONINI <0.30 08/05/2012    Lab Results  Component Value Date   CHOL 143 08/06/2012   Lab Results  Component Value Date   HDL 31* 08/06/2012   Lab Results  Component Value Date   LDLCALC 75 08/06/2012   Lab Results  Component Value Date   TRIG 185* 08/06/2012   Lab Results  Component Value Date   CHOLHDL 4.6 08/06/2012   Recent Labs  08/05/12 1150  INR 1.06    Disposition:  The patient is being discharged in stable condition.  Follow-up:     Follow-up Information   Follow  up with [facility] On 08/10/2012. (At 4:30 PM for echocardiogram)    Contact information:   [address] [place] [address] [phone]      Follow up with Austin Rotunda, MD On 08/15/2012. (At 9:30 AM for hospital follow-up; to discuss echo results and possible cardioversion)    Contact information:   [address] [place] [address] [phone]     Discharge Medications:    Medication List    STOP taking these medications       amLODipine 5 MG tablet  Commonly known as:  NORVASC      TAKE these medications       ADVAIR DISKUS 250-50 MCG/DOSE Aepb  Generic  drug:  Fluticasone-Salmeterol  Inhale 1 puff into the lungs every 12 (twelve) hours.     aspirin 81 MG tablet  Take 81 mg by mouth daily.     atorvastatin 10 MG tablet  Commonly known as:  LIPITOR  Take 10 mg by mouth daily.     diltiazem 120 MG 24 hr capsule  Commonly known as:  CARDIZEM CD  Take 1 capsule (120 mg total) by mouth daily.     enalapril 20 MG tablet  Commonly known as:  VASOTEC  Take 20 mg by mouth 2 (two) times daily.     meloxicam 15 MG tablet  Commonly known as:  MOBIC  Take 15 mg by mouth daily.     omeprazole 20 MG capsule  Commonly known as:  PRILOSEC  Take 20 mg by mouth daily.     oxybutynin 5 MG tablet  Commonly known as:  DITROPAN  Take 5 mg by mouth 2 (two) times daily.     PROVENTIL HFA IN  Inhale into the lungs. Inhale two puffs po q 4 hours prn     Rivaroxaban 20 MG Tabs  Commonly known as:  XARELTO  Take 1 tablet (20 mg total) by mouth daily with supper.     tadalafil 5 MG tablet  Commonly known as:  CIALIS  Take 5 mg by mouth daily as needed for erectile dysfunction.     Vitamin D (Ergocalciferol) 50000 UNITS Caps  Commonly known as:  DRISDOL  Take 50,000 Units by mouth every 7 (seven) days. Takes every Saturday     Vitamin D 2000 UNITS Caps  Take by mouth daily.       Duration of Discharge Encounter: Greater than 30 minutes including physician time.  Signed, Rick Duff, PA-C 08/06/2012, 11:23 AM

## 2012-08-06 NOTE — Discharge Summary (Signed)
Please see my rounding note. 

## 2012-08-06 NOTE — Progress Notes (Signed)
UR Completed Zalan Shidler Graves-Bigelow, RN,BSN [phone]  

## 2012-08-07 ENCOUNTER — Encounter: Payer: Self-pay | Admitting: Nurse Practitioner

## 2012-08-07 DIAGNOSIS — I4891 Unspecified atrial fibrillation: Secondary | ICD-10-CM | POA: Insufficient documentation

## 2012-08-07 NOTE — Progress Notes (Signed)
Subjective:  Presents for complaints of swelling in the lower legs for the past 48 hours. State legs feel tight with burning. Was recently seen by the PA at his job. Had routine lab work. Noticed he had an irregular heartbeat and recommended followup with his primary care provider. Patient has an appointment with cardiology in one week. No chest pain/ischemic type pain. Slight increase in shortness of breath with activity. No orthopnea. Cough mainly in the daytime producing white sputum. Slight sore throat. Off-and-on sinus area headache. No wheezing. Has not used his inhaler. PMH includes valvular surgery, postsurgical DVT, hypertension, hyperlipidemia and asthma. Has started prescription vitamin D due to vitamin D deficiency.  Objective:   BP 138/82  Pulse 70  Wt 284 lb (128.822 kg)  BMI 37.48 kg/m2 NAD. Alert, oriented. Lungs clear. Heart very irregular beat noted. Carotids no bruits or thrills. ECG shows A. fib with rapid ventricular response with a rate of 112. Lower extremities 1-2+ pitting edema. Labs dated 07/21/12 from work shows low vitamin D 19. Glucose 97. Sodium and potassium normal. Liver enzymes normal. Lipid panel total 197, HDL 36, triglycerides 188, LDL 123.  Assessment:Atrial fibrillation  Palpitations - Plan: EKG 12-Lead  Peripheral edema  Plan: Consulted with Dr. Brett Canales. Patient sent directly to local ED for further evaluation and treatment.

## 2012-08-10 ENCOUNTER — Other Ambulatory Visit: Payer: Self-pay

## 2012-08-11 ENCOUNTER — Ambulatory Visit (INDEPENDENT_AMBULATORY_CARE_PROVIDER_SITE_OTHER): Payer: BC Managed Care – PPO | Admitting: Cardiology

## 2012-08-11 DIAGNOSIS — R002 Palpitations: Secondary | ICD-10-CM

## 2012-08-11 DIAGNOSIS — I4891 Unspecified atrial fibrillation: Secondary | ICD-10-CM

## 2012-08-11 DIAGNOSIS — R609 Edema, unspecified: Secondary | ICD-10-CM

## 2012-08-12 ENCOUNTER — Encounter: Payer: Self-pay | Admitting: *Deleted

## 2012-08-12 ENCOUNTER — Ambulatory Visit: Payer: Self-pay | Admitting: Internal Medicine

## 2012-08-15 ENCOUNTER — Telehealth: Payer: Self-pay | Admitting: Cardiology

## 2012-08-15 ENCOUNTER — Encounter: Payer: Self-pay | Admitting: *Deleted

## 2012-08-15 ENCOUNTER — Encounter: Payer: Self-pay | Admitting: Cardiology

## 2012-08-15 ENCOUNTER — Ambulatory Visit (INDEPENDENT_AMBULATORY_CARE_PROVIDER_SITE_OTHER): Payer: BC Managed Care – PPO | Admitting: Cardiology

## 2012-08-15 VITALS — BP 129/86 | HR 91 | Ht 73.0 in | Wt 274.8 lb

## 2012-08-15 DIAGNOSIS — R5383 Other fatigue: Secondary | ICD-10-CM

## 2012-08-15 DIAGNOSIS — R5381 Other malaise: Secondary | ICD-10-CM

## 2012-08-15 DIAGNOSIS — G4730 Sleep apnea, unspecified: Secondary | ICD-10-CM

## 2012-08-15 DIAGNOSIS — I4891 Unspecified atrial fibrillation: Secondary | ICD-10-CM

## 2012-08-15 MED ORDER — B-4 MED COMPRESSION HOSE MENS MISC: 1.0000 | Status: DC

## 2012-08-15 NOTE — Telephone Encounter (Signed)
TEE/DCCV at [facility] on 08/25/12 with [facility] [id number] Fibrillation (booking [phone]) checking percert

## 2012-08-15 NOTE — Telephone Encounter (Signed)
Auth # [id number] exp 09/13/12

## 2012-08-15 NOTE — Patient Instructions (Addendum)
Your physician recommends that you schedule a follow-up appointment in: after your TEE/DCCV. Your physician recommends that you continue on your current medications as directed. Please refer to the Current Medication list given to you today. Your physician has requested that you have a TEE/Cardioversion. During a TEE, sound waves are used to create images of your heart. It provides your doctor with information about the size and shape of your heart and how well your heart's chambers and valves are working. In this test, a transducer is attached to the end of a flexible tube that is guided down you throat and into your esophagus (the tube leading from your mouth to your stomach) to get a more detailed image of your heart. Once the TEE has determined that a blood clot is not present, the cardioversion begins. Electrical Cardioversion uses a jolt of electricity to your heart either through paddles or wired patches attached to your chest. This is a controlled, usually prescheduled, procedure. This procedure is done at the hospital and you are not awake during the procedure. You usually go home the day of the procedure. Please see the instruction sheet given to you today for more information. You are being referred to have a sleep study. You have been given a prescription for compression hose.

## 2012-08-15 NOTE — Progress Notes (Signed)
HPI The patient presents for followup of his atrial fibrillation. He was recently hospitalized for this. He was managed with rate control and anticoagulation in anticipation of an elective cardioversion. He also had some lower extremities the left. However, ejection fraction on echo was normal. There were no significant valvular abnormalities. Since going home he has a very fatigued. He does feel his heart racing. He's not had any presyncope or syncope. He's continued to have some dyspnea on exertion but no PND or orthopnea. He does have some continued lower extremity edema. His most pressing complaint has been fatigue. This probably is long-standing but may be worse over the last several weeks. He does have a history of sleep apnea but at surgery for this and hasn't used CPAP. His wife says he snores sometimes. He doesn't think he has restful sleep. He does move in his sleep. He has not had a followup sleep study in years.  No Known Allergies  Current Outpatient Prescriptions  Medication Sig Dispense Refill  . Albuterol Sulfate (PROVENTIL HFA IN) Inhale into the lungs. Inhale two puffs po q 4 hours prn      . aspirin 81 MG tablet Take 81 mg by mouth daily.      Marland Kitchen atorvastatin (LIPITOR) 10 MG tablet Take 10 mg by mouth daily.      . Cholecalciferol (VITAMIN D) 2000 UNITS CAPS Take by mouth daily.      Marland Kitchen diltiazem (CARDIZEM CD) 120 MG 24 hr capsule Take 1 capsule (120 mg total) by mouth daily.  30 capsule  3  . enalapril (VASOTEC) 20 MG tablet Take 20 mg by mouth 2 (two) times daily.      . Fluticasone-Salmeterol (ADVAIR DISKUS) 250-50 MCG/DOSE AEPB Inhale 1 puff into the lungs every 12 (twelve) hours.      . meloxicam (MOBIC) 15 MG tablet Take 15 mg by mouth daily.      Marland Kitchen omeprazole (PRILOSEC) 20 MG capsule Take 20 mg by mouth daily.      Marland Kitchen oxybutynin (DITROPAN) 5 MG tablet Take 5 mg by mouth 2 (two) times daily.      . Rivaroxaban (XARELTO) 20 MG TABS Take 1 tablet (20 mg total) by mouth daily  with supper.  30 tablet  3  . tadalafil (CIALIS) 5 MG tablet Take 5 mg by mouth daily as needed for erectile dysfunction.      . Vitamin D, Ergocalciferol, (DRISDOL) 50000 UNITS CAPS Take 50,000 Units by mouth every 7 (seven) days. Takes every Saturday       No current facility-administered medications for this visit.    Past Medical History  Diagnosis Date  . Hypertension   . High cholesterol   . Asthma   . GERD (gastroesophageal reflux disease)   . History of DVT (deep vein thrombosis)     a. left leg  . Wolff-Parkinson-White (WPW) syndrome     a. s/p RFCA 2001  . Atrial fibrillation     Dx 08/2012;   . Sleep apnea   . Reflux     Past Surgical History  Procedure Laterality Date  . Back surgery    . Tonsillectomy    . Stomach surgery      reflux, fundoplication  . Nose surgery      sleep apnea surgery  . Cardiac surgery      ROS:  As stated in the HPI and negative for all other systems.  PHYSICAL EXAM BP 129/86  Pulse 91  Ht 6\' 1"  (  1.854 m)  Wt 274 lb 12.8 oz (124.648 kg)  BMI 36.26 kg/m2  SpO2 97% GENERAL:  Well appearing HEENT:  Pupils equal round and reactive, fundi not visualized, oral mucosa unremarkable NECK:  No jugular venous distention, waveform within normal limits, carotid upstroke brisk and symmetric, no bruits, no thyromegaly LYMPHATICS:  No cervical, inguinal adenopathy LUNGS:  Clear to auscultation bilaterally BACK:  No CVA tenderness CHEST:  Unremarkable HEART:  PMI not displaced or sustained,S1 and S2 within normal limits, no S3,  no clicks, no rubs, no murmurs, irregular ABD:  Flat, positive bowel sounds normal in frequency in pitch, no bruits, no rebound, no guarding, no midline pulsatile mass, no hepatomegaly, no splenomegaly, Obese EXT:  2 plus pulses throughout, mild bilateral lower extremity edema, no cyanosis no clubbing SKIN:  No rashes no nodules NEURO:  Cranial nerves II through XII grossly intact, motor grossly intact  throughout PSYCH:  Cognitively intact, oriented to person place and time  EKG:   Atrial fibrillation, rate 86, axis within normal limits, intervals within normal limits, early transition in lead V2 ,nonspecific T-wave flattening. 08/15/2012  ASSESSMENT AND PLAN  ATRIAL FIBRILLATION:  Given the patient's symptoms he would like to proceed with cardioversion sooner than was initially planned. He's only been on the Xarelto x 1 week.  Therefore, I will plan for him to come to [facility] for a TEE/DCCV this week.  He will remain on his medications as listed.  HTN:  The blood pressure is at target. No change in medications is indicated. We will continue with therapeutic lifestyle changes (TLC).  OBESITY:    I will continue to have discussions with him about this with specific instructions for weight loss.  FATIGUE:  I suspect that this is in part related to poor sleep habits.  I wonder if there is not ongoing sleep apnea or other sleep disturbance.  I will schedule him for a sleep study.  EDEMA:  I will give him a prescription for compression stockings. Unfortunately his job requires that he is on his feet during the entire shift.

## 2012-08-23 ENCOUNTER — Encounter: Payer: Self-pay | Admitting: Family Medicine

## 2012-08-25 ENCOUNTER — Ambulatory Visit (HOSPITAL_COMMUNITY): Payer: BC Managed Care – PPO | Admitting: Certified Registered Nurse Anesthetist

## 2012-08-25 ENCOUNTER — Encounter (HOSPITAL_COMMUNITY): Payer: Self-pay | Admitting: Certified Registered Nurse Anesthetist

## 2012-08-25 ENCOUNTER — Encounter (HOSPITAL_COMMUNITY): Payer: Self-pay

## 2012-08-25 ENCOUNTER — Other Ambulatory Visit: Payer: Self-pay | Admitting: *Deleted

## 2012-08-25 ENCOUNTER — Ambulatory Visit (HOSPITAL_COMMUNITY)
Admission: RE | Admit: 2012-08-25 | Discharge: 2012-08-25 | Disposition: A | Discharge status: Home / Self Care | Payer: BC Managed Care – PPO | Source: Ambulatory Visit | Attending: Cardiology | Admitting: Cardiology

## 2012-08-25 ENCOUNTER — Encounter (HOSPITAL_COMMUNITY)
Admission: RE | Disposition: A | Discharge status: Home / Self Care | Payer: Self-pay | Source: Ambulatory Visit | Attending: Cardiology

## 2012-08-25 DIAGNOSIS — I519 Heart disease, unspecified: Secondary | ICD-10-CM | POA: Insufficient documentation

## 2012-08-25 DIAGNOSIS — I456 Pre-excitation syndrome: Secondary | ICD-10-CM | POA: Insufficient documentation

## 2012-08-25 DIAGNOSIS — K219 Gastro-esophageal reflux disease without esophagitis: Secondary | ICD-10-CM | POA: Insufficient documentation

## 2012-08-25 DIAGNOSIS — G4730 Sleep apnea, unspecified: Secondary | ICD-10-CM | POA: Insufficient documentation

## 2012-08-25 DIAGNOSIS — Z7901 Long term (current) use of anticoagulants: Secondary | ICD-10-CM | POA: Insufficient documentation

## 2012-08-25 DIAGNOSIS — I10 Essential (primary) hypertension: Secondary | ICD-10-CM | POA: Insufficient documentation

## 2012-08-25 DIAGNOSIS — I4891 Unspecified atrial fibrillation: Secondary | ICD-10-CM | POA: Insufficient documentation

## 2012-08-25 DIAGNOSIS — Z79899 Other long term (current) drug therapy: Secondary | ICD-10-CM | POA: Insufficient documentation

## 2012-08-25 DIAGNOSIS — E7800 Pure hypercholesterolemia, unspecified: Secondary | ICD-10-CM | POA: Insufficient documentation

## 2012-08-25 DIAGNOSIS — Z7982 Long term (current) use of aspirin: Secondary | ICD-10-CM | POA: Insufficient documentation

## 2012-08-25 DIAGNOSIS — E669 Obesity, unspecified: Secondary | ICD-10-CM | POA: Insufficient documentation

## 2012-08-25 DIAGNOSIS — Z86718 Personal history of other venous thrombosis and embolism: Secondary | ICD-10-CM | POA: Insufficient documentation

## 2012-08-25 DIAGNOSIS — J45909 Unspecified asthma, uncomplicated: Secondary | ICD-10-CM | POA: Insufficient documentation

## 2012-08-25 DIAGNOSIS — Z6836 Body mass index (BMI) 36.0-36.9, adult: Secondary | ICD-10-CM | POA: Insufficient documentation

## 2012-08-25 HISTORY — PX: CARDIOVERSION: SHX1299

## 2012-08-25 HISTORY — PX: TEE WITHOUT CARDIOVERSION: SHX5443

## 2012-08-25 SURGERY — ECHOCARDIOGRAM, TRANSESOPHAGEAL
Anesthesia: Moderate Sedation

## 2012-08-25 MED ORDER — MIDAZOLAM HCL 5 MG/ML IJ SOLN
INTRAMUSCULAR | Status: AC
  Filled 2012-08-25: qty 2

## 2012-08-25 MED ORDER — LIDOCAINE HCL (CARDIAC) 20 MG/ML IV SOLN
INTRAVENOUS | Status: DC | PRN
  Administered 2012-08-25: 40 mg via INTRAVENOUS

## 2012-08-25 MED ORDER — BUTAMBEN-TETRACAINE-BENZOCAINE 2-2-14 % EX AERO
INHALATION_SPRAY | CUTANEOUS | Status: DC | PRN
  Administered 2012-08-25: 2 via TOPICAL

## 2012-08-25 MED ORDER — SODIUM CHLORIDE 0.9 % IV SOLN
INTRAVENOUS | Status: DC
Start: 2012-08-25 — End: 2012-08-25

## 2012-08-25 MED ORDER — SODIUM CHLORIDE 0.9 % IV SOLN
INTRAVENOUS | Status: DC
  Administered 2012-08-25: 12:00:00 via INTRAVENOUS

## 2012-08-25 MED ORDER — FENTANYL CITRATE 0.05 MG/ML IJ SOLN
INTRAMUSCULAR | Status: DC | PRN
  Administered 2012-08-25: 25 ug via INTRAVENOUS
  Administered 2012-08-25: 50 ug via INTRAVENOUS

## 2012-08-25 MED ORDER — PROPOFOL 10 MG/ML IV BOLUS
INTRAVENOUS | Status: DC | PRN
  Administered 2012-08-25: 80 mg via INTRAVENOUS

## 2012-08-25 MED ORDER — FENTANYL CITRATE 0.05 MG/ML IJ SOLN
INTRAMUSCULAR | Status: AC
  Filled 2012-08-25: qty 2

## 2012-08-25 MED ORDER — METOPROLOL TARTRATE 25 MG PO TABS: 25.0000 mg | ORAL_TABLET | Freq: Two times a day (BID) | ORAL | Status: DC

## 2012-08-25 MED ORDER — MIDAZOLAM HCL 10 MG/2ML IJ SOLN
INTRAMUSCULAR | Status: DC | PRN
  Administered 2012-08-25: 2 mg via INTRAVENOUS
  Administered 2012-08-25: 1 mg via INTRAVENOUS
  Administered 2012-08-25: 2 mg via INTRAVENOUS

## 2012-08-25 NOTE — Progress Notes (Signed)
Patient is in atrial fibrillation, called Dr Elease Hashimoto with order to do another 12 lead EKG, discontinued cardizem and to start metoprolol 25 mg orally BID to start 1st dose this evening.

## 2012-08-25 NOTE — Progress Notes (Signed)
Per Dr Elease Hashimoto - pt to stop Diltiazem and start Metoprolol 25 mg twice a day.  He is to f/u in 1 week and has an appt in the [place] office 09/02/12.  Rx sent into pharmacy.

## 2012-08-25 NOTE — Transfer of Care (Signed)
Immediate Anesthesia Transfer of Care Note  Patient: Austin Woods  Procedure(s) Performed: Procedure(s): TRANSESOPHAGEAL ECHOCARDIOGRAM (TEE) (N/A) CARDIOVERSION (N/A)  Patient Location: PACU  Anesthesia Type:General  Level of Consciousness: awake, sedated and patient cooperative  Airway & Oxygen Therapy: Patient Spontanous Breathing and Patient connected to nasal cannula oxygen  Post-op Assessment: Report given to PACU RN, Post -op Vital signs reviewed and stable and Patient moving all extremities  Post vital signs: Reviewed and stable  Complications: No apparent anesthesia complications

## 2012-08-25 NOTE — CV Procedure (Signed)
    Transesophageal Echocardiogram Note  TAMOTSU WIEDERHOLT [id number] [birth year]  Procedure: Transesophageal Echocardiogram Indications: atrial fib  Procedure Details Consent: Obtained Time Out: Verified patient identification, verified procedure, site/side was marked, verified correct patient position, special equipment/implants available, Radiology Safety Procedures followed,  medications/allergies/relevent history reviewed, required imaging and test results available.  Performed  Medications: Fentanyl: 75 mcg IV Versed: 5 mg IV  Left Ventrical:  Marked LV dysfunction, EF 20%  Mitral Valve: trace MR  Aortic Valve: normal  Tricuspid Valve: normal  Pulmonic Valve: not visualized well  Left Atrium/ Left atrial appendage: no thrombi, some spontaneous contrast in LA  Atrial septum: no ASD by color doppler  Aorta: mild calcification.   Complications: No apparent complications Patient did tolerate procedure well.  Will proceed with cardioversion.     Cardioversion Note  BANKS CHAIKIN [id number] 17-Aug-1965  Procedure: DC Cardioversion Indications: atrial fib.   Procedure Details Consent: Obtained Time Out: Verified patient identification, verified procedure, site/side was marked, verified correct patient position, special equipment/implants available, Radiology Safety Procedures followed,  medications/allergies/relevent history reviewed, required imaging and test results available.  Performed  The patient has been on adequate anticoagulation.  The patient received IV Lidocaine 50 mg and Propofol 85 mg  for sedation.  Synchronous cardioversion was performed at 200  joules.  The cardioversion was successful.    Complications: No apparent complications Patient did tolerate procedure well.   Vesta Mixer, Montez Hageman., MD, [facility] 08/25/2012, 1:43 PM         Vesta Mixer, Montez Hageman., MD, [facility] 08/25/2012, 1:40 PM

## 2012-08-25 NOTE — Progress Notes (Signed)
  Echocardiogram Echocardiogram Transesophageal has been performed.  Austin Woods 08/25/2012, 3:28 PM

## 2012-08-25 NOTE — Anesthesia Postprocedure Evaluation (Signed)
  Anesthesia Post-op Note  Patient: Austin Woods  Procedure(s) Performed: Procedure(s): TRANSESOPHAGEAL ECHOCARDIOGRAM (TEE) (N/A) CARDIOVERSION (N/A)  Patient Location: PACU  Anesthesia Type:General  Level of Consciousness: awake  Airway and Oxygen Therapy: Patient Spontanous Breathing  Post-op Pain: none  Post-op Assessment: Post-op Vital signs reviewed  Post-op Vital Signs: stable  Complications: No apparent anesthesia complications

## 2012-08-25 NOTE — Interval H&P Note (Signed)
History and Physical Interval Note:  08/25/2012 1:21 PM  Austin Woods  has presented today for surgery, with the diagnosis of a-fib  The various methods of treatment have been discussed with the patient and family. After consideration of risks, benefits and other options for treatment, the patient has consented to  Procedure(s): TRANSESOPHAGEAL ECHOCARDIOGRAM (TEE) (N/A) CARDIOVERSION (N/A) as a surgical intervention .  The patient's history has been reviewed, patient examined, no change in status, stable for surgery.  I have reviewed the patient's chart and labs.  Questions were answered to the patient's satisfaction.     Elyn Aquas.

## 2012-08-25 NOTE — Preoperative (Signed)
Beta Blockers   Reason not to administer Beta Blockers:Not Applicable 

## 2012-08-25 NOTE — Anesthesia Procedure Notes (Signed)
Date/Time: 08/25/2012 1:53 PM Performed by: Angelica Pou Pre-anesthesia Checklist: Patient identified, Emergency Drugs available, Suction available, Patient being monitored and Timeout performed Patient Re-evaluated:Patient Re-evaluated prior to inductionOxygen Delivery Method: Ambu bag Preoxygenation: Pre-oxygenation with 100% oxygen Intubation Type: IV induction Ventilation: Oral airway inserted - appropriate to patient size and Mask ventilation without difficulty Dental Injury: Teeth and Oropharynx as per pre-operative assessment

## 2012-08-25 NOTE — H&P (View-Only) (Signed)
 HPI The patient presents for followup of his atrial fibrillation. He was recently hospitalized for this. He was managed with rate control and anticoagulation in anticipation of an elective cardioversion. He also had some lower extremities the left. However, ejection fraction on echo was normal. There were no significant valvular abnormalities. Since going home he has a very fatigued. He does feel his heart racing. He's not had any presyncope or syncope. He's continued to have some dyspnea on exertion but no PND or orthopnea. He does have some continued lower extremity edema. His most pressing complaint has been fatigue. This probably is long-standing but may be worse over the last several weeks. He does have a history of sleep apnea but at surgery for this and hasn't used CPAP. His wife says he snores sometimes. He doesn't think he has restful sleep. He does move in his sleep. He has not had a followup sleep study in years.  No Known Allergies  Current Outpatient Prescriptions  Medication Sig Dispense Refill  . Albuterol Sulfate (PROVENTIL HFA IN) Inhale into the lungs. Inhale two puffs po q 4 hours prn      . aspirin 81 MG tablet Take 81 mg by mouth daily.      . atorvastatin (LIPITOR) 10 MG tablet Take 10 mg by mouth daily.      . Cholecalciferol (VITAMIN D) 2000 UNITS CAPS Take by mouth daily.      . diltiazem (CARDIZEM CD) 120 MG 24 hr capsule Take 1 capsule (120 mg total) by mouth daily.  30 capsule  3  . enalapril (VASOTEC) 20 MG tablet Take 20 mg by mouth 2 (two) times daily.      . Fluticasone-Salmeterol (ADVAIR DISKUS) 250-50 MCG/DOSE AEPB Inhale 1 puff into the lungs every 12 (twelve) hours.      . meloxicam (MOBIC) 15 MG tablet Take 15 mg by mouth daily.      . omeprazole (PRILOSEC) 20 MG capsule Take 20 mg by mouth daily.      . oxybutynin (DITROPAN) 5 MG tablet Take 5 mg by mouth 2 (two) times daily.      . Rivaroxaban (XARELTO) 20 MG TABS Take 1 tablet (20 mg total) by mouth daily  with supper.  30 tablet  3  . tadalafil (CIALIS) 5 MG tablet Take 5 mg by mouth daily as needed for erectile dysfunction.      . Vitamin D, Ergocalciferol, (DRISDOL) 50000 UNITS CAPS Take 50,000 Units by mouth every 7 (seven) days. Takes every Saturday       No current facility-administered medications for this visit.    Past Medical History  Diagnosis Date  . Hypertension   . High cholesterol   . Asthma   . GERD (gastroesophageal reflux disease)   . History of DVT (deep vein thrombosis)     a. left leg  . Wolff-Parkinson-White (WPW) syndrome     a. s/p RFCA 2001  . Atrial fibrillation     Dx 08/2012;   . Sleep apnea   . Reflux     Past Surgical History  Procedure Laterality Date  . Back surgery    . Tonsillectomy    . Stomach surgery      reflux, fundoplication  . Nose surgery      sleep apnea surgery  . Cardiac surgery      ROS:  As stated in the HPI and negative for all other systems.  PHYSICAL EXAM BP 129/86  Pulse 91  Ht 6' 1" (  1.854 m)  Wt 274 lb 12.8 oz (124.648 kg)  BMI 36.26 kg/m2  SpO2 97% GENERAL:  Well appearing HEENT:  Pupils equal round and reactive, fundi not visualized, oral mucosa unremarkable NECK:  No jugular venous distention, waveform within normal limits, carotid upstroke brisk and symmetric, no bruits, no thyromegaly LYMPHATICS:  No cervical, inguinal adenopathy LUNGS:  Clear to auscultation bilaterally BACK:  No CVA tenderness CHEST:  Unremarkable HEART:  PMI not displaced or sustained,S1 and S2 within normal limits, no S3,  no clicks, no rubs, no murmurs, irregular ABD:  Flat, positive bowel sounds normal in frequency in pitch, no bruits, no rebound, no guarding, no midline pulsatile mass, no hepatomegaly, no splenomegaly, Obese EXT:  2 plus pulses throughout, mild bilateral lower extremity edema, no cyanosis no clubbing SKIN:  No rashes no nodules NEURO:  Cranial nerves II through XII grossly intact, motor grossly intact  throughout PSYCH:  Cognitively intact, oriented to person place and time  EKG:   Atrial fibrillation, rate 86, axis within normal limits, intervals within normal limits, early transition in lead V2 ,nonspecific T-wave flattening. 08/15/2012  ASSESSMENT AND PLAN  ATRIAL FIBRILLATION:  Given the patient's symptoms he would like to proceed with cardioversion sooner than was initially planned. He's only been on the Xarelto x 1 week.  Therefore, I will plan for him to come to [facility] for a TEE/DCCV this week.  He will remain on his medications as listed.  HTN:  The blood pressure is at target. No change in medications is indicated. We will continue with therapeutic lifestyle changes (TLC).  OBESITY:    I will continue to have discussions with him about this with specific instructions for weight loss.  FATIGUE:  I suspect that this is in part related to poor sleep habits.  I wonder if there is not ongoing sleep apnea or other sleep disturbance.  I will schedule him for a sleep study.  EDEMA:  I will give him a prescription for compression stockings. Unfortunately his job requires that he is on his feet during the entire shift.    

## 2012-08-25 NOTE — Anesthesia Preprocedure Evaluation (Addendum)
Anesthesia Evaluation  Patient identified by MRN, date of birth, ID band Patient awake    Reviewed: Allergy & Precautions, H&P , NPO status , Patient's Chart, lab work & pertinent test results  History of Anesthesia Complications Negative for: history of anesthetic complications  Airway Mallampati: II TM Distance: >3 FB Neck ROM: Full    Dental  (+) Teeth Intact and Dental Advisory Given   Pulmonary asthma , sleep apnea ,  breath sounds clear to auscultation  Pulmonary exam normal       Cardiovascular Exercise Tolerance: Good hypertension, Pt. on medications + dysrhythmias Atrial Fibrillation Rhythm:Irregular     Neuro/Psych    GI/Hepatic Neg liver ROS, GERD-  Controlled and Medicated,  Endo/Other  negative endocrine ROS  Renal/GU negative Renal ROS     Musculoskeletal negative musculoskeletal ROS (+)   Abdominal   Peds  Hematology negative hematology ROS (+)   Anesthesia Other Findings   Reproductive/Obstetrics                         Anesthesia Physical Anesthesia Plan  ASA: III  Anesthesia Plan: General   Post-op Pain Management:    Induction: Intravenous  Airway Management Planned: Mask  Additional Equipment:   Intra-op Plan:   Post-operative Plan:   Informed Consent: I have reviewed the patients History and Physical, chart, labs and discussed the procedure including the risks, benefits and alternatives for the proposed anesthesia with the patient or authorized representative who has indicated his/her understanding and acceptance.   Dental advisory given  Plan Discussed with: CRNA and Surgeon  Anesthesia Plan Comments:        Anesthesia Quick Evaluation

## 2012-08-26 ENCOUNTER — Encounter (HOSPITAL_COMMUNITY): Payer: Self-pay | Admitting: Cardiovascular Disease

## 2012-09-02 ENCOUNTER — Encounter: Payer: Self-pay | Admitting: *Deleted

## 2012-09-02 ENCOUNTER — Encounter: Payer: Self-pay | Admitting: Cardiology

## 2012-09-02 ENCOUNTER — Ambulatory Visit (INDEPENDENT_AMBULATORY_CARE_PROVIDER_SITE_OTHER): Payer: BC Managed Care – PPO | Admitting: Cardiology

## 2012-09-02 ENCOUNTER — Other Ambulatory Visit: Payer: Self-pay | Admitting: Physician Assistant

## 2012-09-02 VITALS — BP 153/107 | HR 93 | Ht 73.0 in | Wt 279.0 lb

## 2012-09-02 DIAGNOSIS — I4891 Unspecified atrial fibrillation: Secondary | ICD-10-CM

## 2012-09-02 NOTE — Progress Notes (Signed)
HPI  The patient presents for followup of his atrial fibrillation. He has a prior history of Wolff-Parkinson-White syndrome status post radiofrequency catheter ablation in 2001.  We saw him in consultation in early May with a two week history of rapid heart rate.  The EKG demonstrated atrial fib.   Approximately 2 weeks ago, he was seen for a routine physical on his work site and was told by the physician assistant that his heart rate was fast and irregular and it was recommended that he followup with his primary care provider. He did not necessarily notice tachypalpitations. He schedule an appointment to be seen by his primary care provider today and after an ECG was performed, he was noted to be in atrial fibrillation with rapid ventricular response. He was sent to the [facility] emergency department for further evaluation and subsequently transferred here for cardiology admission. Of note, his BNP was mildly elevated at [facility] at 707.7, and chest x-ray showed cardiomegaly with central vascular congestion. He was given a dose of IV Lasix as well as a 20 mg IV diltiazem bolus with improvement in rate control prior to leaving [employer]. Currently, he is symptom-free. He remained in atrial fibrillation with rates in the low 100s.   Ejection fraction on echo was normal. There were no significant valvular abnormalities.  He was started on Xarelto.  However,  did undergo TEE cardioversion . He was in sinus rhythm briefly. However, he returned to atrial fibrillation prior to leaving the hospital.  Also was noted at that on TEE to have an EF of 20%. He still feels very fatigued. He feels like his heart is beating fast at times. He does have dyspnea with exertion which is unchanged from previous.  He denies chest pain, neck or arm discomfort. He has had no presyncope or syncope.  Is not having any new PND or orthopnea.  His most pressing complaint continues to be fatigue. This probably is  long-standing but may be worse over the last several weeks. He does have a history of sleep apnea but at surgery for this and hasn't used CPAP. His wife says he snores sometimes. He doesn't think he has restful sleep. He does move in his sleep. He has not had a followup sleep study in years.   No Known Allergies  Current Outpatient Prescriptions  Medication Sig Dispense Refill  . Albuterol Sulfate (PROVENTIL HFA IN) Inhale into the lungs. Inhale two puffs po q 4 hours prn      . atorvastatin (LIPITOR) 10 MG tablet Take 10 mg by mouth daily.      . Cholecalciferol (VITAMIN D) 2000 UNITS CAPS Take by mouth daily.      . Elastic Bandages & Supports (B-4 MED COMPRESSION HOSE MENS) MISC 1 each by Does not apply route as directed.  1 each  0  . enalapril (VASOTEC) 20 MG tablet Take 20 mg by mouth 2 (two) times daily.      . Fluticasone-Salmeterol (ADVAIR DISKUS) 250-50 MCG/DOSE AEPB Inhale 1 puff into the lungs every 12 (twelve) hours.      . meloxicam (MOBIC) 15 MG tablet Take 15 mg by mouth daily.      . metoprolol tartrate (LOPRESSOR) 25 MG tablet Take 1 tablet (25 mg total) by mouth 2 (two) times daily.  60 tablet  11  . omeprazole (PRILOSEC) 20 MG capsule Take 20 mg by mouth daily.      Marland Kitchen oxybutynin (DITROPAN) 5 MG tablet Take 5  mg by mouth 2 (two) times daily.      . Rivaroxaban (XARELTO) 20 MG TABS Take 1 tablet (20 mg total) by mouth daily with supper.  30 tablet  3  . tadalafil (CIALIS) 5 MG tablet Take 5 mg by mouth daily as needed for erectile dysfunction.      . Vitamin D, Ergocalciferol, (DRISDOL) 50000 UNITS CAPS Take 50,000 Units by mouth every 7 (seven) days. Takes every Saturday       No current facility-administered medications for this visit.    Past Medical History  Diagnosis Date  . Hypertension   . High cholesterol   . Asthma   . GERD (gastroesophageal reflux disease)   . History of DVT (deep vein thrombosis)     a. left leg  . Wolff-Parkinson-White (WPW) syndrome      a. s/p RFCA 2001  . Atrial fibrillation     Dx 08/2012;   . Sleep apnea   . Reflux     Past Surgical History  Procedure Laterality Date  . Back surgery    . Tonsillectomy    . Stomach surgery      reflux, fundoplication  . Nose surgery      sleep apnea surgery  . Cardiac surgery    . Tee without cardioversion N/A 08/25/2012    Procedure: TRANSESOPHAGEAL ECHOCARDIOGRAM (TEE);  Surgeon: Vesta Mixer, MD;  Location: [facility] ENDOSCOPY;  Service: Cardiovascular;  Laterality: N/A;  . Cardioversion N/A 08/25/2012    Procedure: CARDIOVERSION;  Surgeon: Vesta Mixer, MD;  Location: [facility] ENDOSCOPY;  Service: Cardiovascular;  Laterality: N/A;    Family History  Problem Relation Age of Onset  . CAD Father 54  . Diabetes Father   . Heart attack Father   . Diabetes Mother   . Diabetes Brother     History   Social History  . Marital Status: Married    Spouse Name: N/A    Number of Children: 2  . Years of Education: N/A   Occupational History  .  [employer]   Social History Main Topics  . Smoking status: Former Smoker    Quit date: 08/06/2010  . Smokeless tobacco: Not on file  . Alcohol Use: No  . Drug Use: No  . Sexually Active: Not on file   Other Topics Concern  . Not on file   Social History Narrative  . No narrative on file    ROS:  As stated in the HPI and negative for all other systems.   PHYSICAL EXAM BP 153/107  Pulse 93  Ht 6\' 1"  (1.854 m)  Wt 279 lb (126.554 kg)  BMI 36.82 kg/m2 GENERAL:  Well appearing HEENT:  Pupils equal round and reactive, fundi not visualized, oral mucosa unremarkable NECK:  No jugular venous distention, waveform within normal limits, carotid upstroke brisk and symmetric, no bruits, no thyromegaly LYMPHATICS:  No cervical, inguinal adenopathy LUNGS:  Clear to auscultation bilaterally BACK:  No CVA tenderness CHEST:  Unremarkable HEART:  PMI not displaced or sustained,S1 and S2 within normal limits, no S3,no clicks, no rubs, no  murmurs, irregular ABD:  Flat, positive bowel sounds normal in frequency in pitch, no bruits, no rebound, no guarding, no midline pulsatile mass, no hepatomegaly, no splenomegaly EXT:  2 plus pulses throughout, mild bilateral lower extremityedema, no cyanosis no clubbing SKIN:  No rashes no nodules NEURO:  Cranial nerves II through XII grossly intact, motor grossly intact throughout PSYCH:  Cognitively intact, oriented to person place and  time   EKG:  Atrial fibrillation, rate 107, premature ectopic complexes, axis within normal limits, RSR prime V1, QTC within normal limits, no acute ST-T wave changes.  ASSESSMENT AND PLAN  ATRIAL FIBRILLATION: Given the patient's cardiomyopathy which is rate related and his symptoms he needs to maintain sinus rhythm. I will have him come to the hospital for admission tomorrow to begin Tikosyn.   We will be unable to check potassium and magnesium tonight be done in the morning. I have notified our team of his admission and orders will need to be written in the morning.  HTN: The blood pressure is slightly elevated today. He may need med titration to his hospitalization though his blood pressure has not been this elevated at previous readings.  OBESITY: I will continue to have discussions with him about this with specific instructions for weight loss.   FATIGUE: I suspect that this is in part related to poor sleep habits. I wonder if there is not ongoing sleep apnea or other sleep disturbance. He is to be scheduled him for a sleep study.   EDEMA:  Unfortunately his job requires that he is on his feet during the entire shift.  He did pick up some compression stockings at the last visit.  CARDIOMYOPATHY:  This is very likely rate related as above as it was normal recently. I would repeat a transthoracic echo on admission to confirm the TEE EF.

## 2012-09-03 ENCOUNTER — Inpatient Hospital Stay (HOSPITAL_COMMUNITY)
Admission: AD | Admit: 2012-09-03 | Discharge: 2012-09-06 | DRG: 138 | Disposition: A | Discharge status: Home / Self Care | Payer: BC Managed Care – PPO | Source: Ambulatory Visit | Attending: Cardiology | Admitting: Cardiology

## 2012-09-03 ENCOUNTER — Encounter (HOSPITAL_COMMUNITY): Payer: Self-pay | Admitting: Cardiology

## 2012-09-03 DIAGNOSIS — K219 Gastro-esophageal reflux disease without esophagitis: Secondary | ICD-10-CM | POA: Diagnosis present

## 2012-09-03 DIAGNOSIS — I4891 Unspecified atrial fibrillation: Principal | ICD-10-CM | POA: Diagnosis present

## 2012-09-03 DIAGNOSIS — Z86718 Personal history of other venous thrombosis and embolism: Secondary | ICD-10-CM

## 2012-09-03 DIAGNOSIS — Z7901 Long term (current) use of anticoagulants: Secondary | ICD-10-CM

## 2012-09-03 DIAGNOSIS — I429 Cardiomyopathy, unspecified: Secondary | ICD-10-CM

## 2012-09-03 DIAGNOSIS — N529 Male erectile dysfunction, unspecified: Secondary | ICD-10-CM | POA: Diagnosis present

## 2012-09-03 DIAGNOSIS — E876 Hypokalemia: Secondary | ICD-10-CM

## 2012-09-03 DIAGNOSIS — J45909 Unspecified asthma, uncomplicated: Secondary | ICD-10-CM | POA: Diagnosis present

## 2012-09-03 DIAGNOSIS — I456 Pre-excitation syndrome: Secondary | ICD-10-CM | POA: Diagnosis present

## 2012-09-03 DIAGNOSIS — E7800 Pure hypercholesterolemia, unspecified: Secondary | ICD-10-CM | POA: Diagnosis present

## 2012-09-03 DIAGNOSIS — Z79899 Other long term (current) drug therapy: Secondary | ICD-10-CM

## 2012-09-03 DIAGNOSIS — G4730 Sleep apnea, unspecified: Secondary | ICD-10-CM | POA: Diagnosis present

## 2012-09-03 DIAGNOSIS — I10 Essential (primary) hypertension: Secondary | ICD-10-CM

## 2012-09-03 LAB — CBC
Hemoglobin: 15.3 g/dL (ref 13.0–17.0)
MCH: 31.9 pg (ref 26.0–34.0)
MCV: 91.2 fL (ref 78.0–100.0)
RBC: 4.79 MIL/uL (ref 4.22–5.81)

## 2012-09-03 LAB — BASIC METABOLIC PANEL
BUN: 17 mg/dL (ref 6–23)
CO2: 26 mEq/L (ref 19–32)
Chloride: 102 mEq/L (ref 96–112)
GFR calc Af Amer: >90 mL/min (ref >90)
Glucose, Bld: 98 mg/dL (ref 70–99)
Potassium: 4.3 mEq/L (ref 3.5–5.1)

## 2012-09-03 LAB — GLUCOSE, CAPILLARY: Glucose-Capillary: 225 mg/dL — ABNORMAL HIGH (ref 70–99)

## 2012-09-03 MED ORDER — DOFETILIDE 500 MCG PO CAPS
500.0000 ug | ORAL_CAPSULE | Freq: Two times a day (BID) | ORAL | Status: DC
  Administered 2012-09-03: 500 ug via ORAL
  Filled 2012-09-03 (×3): qty 1

## 2012-09-03 MED ORDER — MOMETASONE FURO-FORMOTEROL FUM 100-5 MCG/ACT IN AERO
2.0000 | INHALATION_SPRAY | Freq: Two times a day (BID) | RESPIRATORY_TRACT | Status: DC
  Administered 2012-09-03 – 2012-09-06 (×6): 2 via RESPIRATORY_TRACT
  Filled 2012-09-03: qty 8.8

## 2012-09-03 MED ORDER — OXYBUTYNIN CHLORIDE 5 MG PO TABS
5.0000 mg | ORAL_TABLET | Freq: Two times a day (BID) | ORAL | Status: DC
  Administered 2012-09-03 – 2012-09-06 (×6): 5 mg via ORAL
  Filled 2012-09-03 (×7): qty 1

## 2012-09-03 MED ORDER — B-4 MED COMPRESSION HOSE MENS MISC: 1.0000 | Status: DC

## 2012-09-03 MED ORDER — SODIUM CHLORIDE 0.9 % IJ SOLN
3.0000 mL | Freq: Two times a day (BID) | INTRAMUSCULAR | Status: DC
  Administered 2012-09-03 – 2012-09-06 (×6): 3 mL via INTRAVENOUS

## 2012-09-03 MED ORDER — ENALAPRIL MALEATE 20 MG PO TABS
20.0000 mg | ORAL_TABLET | Freq: Two times a day (BID) | ORAL | Status: DC
  Administered 2012-09-03 – 2012-09-06 (×6): 20 mg via ORAL
  Filled 2012-09-03 (×8): qty 1

## 2012-09-03 MED ORDER — ALBUTEROL SULFATE HFA 108 (90 BASE) MCG/ACT IN AERS
2.0000 | INHALATION_SPRAY | RESPIRATORY_TRACT | Status: DC | PRN
  Filled 2012-09-03: qty 6.7

## 2012-09-03 MED ORDER — PANTOPRAZOLE SODIUM 40 MG PO TBEC
40.0000 mg | DELAYED_RELEASE_TABLET | Freq: Every day | ORAL | Status: DC
  Administered 2012-09-04 – 2012-09-06 (×3): 40 mg via ORAL
  Filled 2012-09-03 (×3): qty 1

## 2012-09-03 MED ORDER — METOPROLOL TARTRATE 50 MG PO TABS
50.0000 mg | ORAL_TABLET | Freq: Two times a day (BID) | ORAL | Status: DC
  Administered 2012-09-03 – 2012-09-06 (×6): 50 mg via ORAL
  Filled 2012-09-03 (×8): qty 1

## 2012-09-03 MED ORDER — SODIUM CHLORIDE 0.9 % IJ SOLN: 3.0000 mL | INTRAMUSCULAR | Status: DC | PRN

## 2012-09-03 MED ORDER — SODIUM CHLORIDE 0.9 % IV SOLN: 250.0000 mL | INTRAVENOUS | Status: DC | PRN

## 2012-09-03 MED ORDER — RIVAROXABAN 20 MG PO TABS
20.0000 mg | ORAL_TABLET | Freq: Every day | ORAL | Status: DC
  Administered 2012-09-03 – 2012-09-05 (×3): 20 mg via ORAL
  Filled 2012-09-03 (×4): qty 1

## 2012-09-03 MED ORDER — ATORVASTATIN CALCIUM 10 MG PO TABS
10.0000 mg | ORAL_TABLET | Freq: Every day | ORAL | Status: DC
  Administered 2012-09-03 – 2012-09-06 (×4): 10 mg via ORAL
  Filled 2012-09-03 (×4): qty 1

## 2012-09-03 MED ORDER — MELOXICAM 15 MG PO TABS
15.0000 mg | ORAL_TABLET | Freq: Every day | ORAL | Status: DC
  Administered 2012-09-03 – 2012-09-06 (×4): 15 mg via ORAL
  Filled 2012-09-03 (×5): qty 1

## 2012-09-03 NOTE — Progress Notes (Addendum)
Pharmacy Consult for Dofetilide (Tikosyn) Iniation  Admit Complaint: 47 y.o. male admitted 09/03/2012 with atrial fibrillation to be initiated on dofetilide.   Assessment:  Patient Exclusion Criteria: If any screening criteria checked as "Yes", then  patient  should NOT receive dofetilide until criteria item is corrected. If "Yes" please indicate correction plan.  YES  NO Patient  Exclusion Criteria Correction Plan  [x]  [x]  Baseline QTc interval is greater than or equal to 440 msec. IF above YES box checked dofetilide contraindicated unless patient has ICD; then may proceed if QTc 500-550 msec or with known ventricular conduction abnormalities may proceed with QTc 550-600 msec. QTc = 441 Per office note QTc wnl, QTc, 441> spoke with RN, holding tikosyn till evaluated by EP  []  [x]  Magnesium level is less than 1.8 mEq/l : Last magnesium:  Lab Results  Component Value Date   MG 2.1 09/03/2012       Current lab pending  []  [x]  Potassium level is less than 4 mEq/l : Last potassium:  Lab Results  Component Value Date   K 4.3 09/03/2012       Current lab pending  []  [x]  Patient is known or suspected to have a digoxin level greater than 2 ng/ml: No results found for this basename: DIGOXIN      []  [x]  Creatinine clearance less than 20 ml/min (calculated using Cockcroft-Gault, actual body weight and serum creatinine): Estimated Creatinine Clearance: 118.3 ml/min (by C-G formula based on Cr of 1.05).  Labs pending  []  [x]  Patient has received drugs known to prolong the QT intervals within the last 48 hours(phenothiazines, tricyclics or tetracyclic antidepressants, erythromycin, H-1 antihistamines, cisapride, fluoroquinolones, azithromycin). Drugs not listed above may have an, as yet, undetected potential to prolong the QT interval, updated information on QT prolonging agents is available at this website:QT prolonging agents   []  [x]  Patient received a dose of hydrochlorothiazide (Oretic) alone  or in any combination including triamterene (Dyazide, Maxzide) in the last 48 hours.   []  [x]  Patient received a medication known to increase dofetilide plasma concentrations prior to initial dofetilide dose:    Trimethoprim (Primsol, Proloprim) in the last 36 hours   Verapamil (Calan, Verelan) in the last 36 hours or a sustained release dose in the last 72 hours   Megestrol (Megace) in the last 5 days    Cimetidine (Tagamet) in the last 6 hours   Ketoconazole (Nizoral) in the last 24 hours   Itraconazole (Sporanox) in the last 48 hours    Prochlorperazine (Compazine) in the last 36 hours    []  [x]  Patient is known to have a history of torsades de pointes; congenital or acquired long QT syndromes.   []  [x]  Patient has received a Class 1 antiarrhythmic with less than 2 half-lives since last dose. (Disopyramide, Quinidine, Procainamide, Lidocaine, Mexiletine, Flecainide, Propafenone)   []  [x]  Patient has received amiodarone therapy in the past 3 months or amiodarone level is greater than 0.3 ng/ml.    Patient has been appropriately anticoagulated with xarelto.  Ordering provider was confirmed at [email or web address] if they are not listed on the [facility] Authorized Prescribers list.  Goal of Therapy:  Follow renal function, electrolytes, potential drug interactions, and dose adjustment. Provide education and 1 week supply at discharge.  Plan:  *Follow up baseline labs then dose tikosyn 1.  Initiate dofetilide based on renal function; pending follow up by EP, Dr. Diona Browner to order as indicated. [employer] Calculated CrCl  Dose q12h  [x]  > 60  ml/min 500 mcg  []  40-60 ml/min 250 mcg  []  20-40 ml/min 125 mcg   2. Follow up QTc after the first 5 doses, renal function, electrolytes (K & Mg) daily x 3 days, dose adjustment, success of initiation and facilitate 1 week discharge supply as clinically indicated.  3. Initiate Tikosyn education video (Call [phone] and ask for video # 116).  4. Place  Enrollment Form on the chart for discharge supply of dofetilide.   Thank you for allowing pharmacy to be a part of this patients care team.  Lovenia Kim Pharm.D., BCPS Clinical Pharmacist 09/03/2012 4:47 PM Pager: [phone] Phone: [phone]

## 2012-09-03 NOTE — H&P (Signed)
Primary cardiologist: Dr. Rollene Rotunda  HPI  The patient presents for followup of his atrial fibrillation. He has a prior history of Wolff-Parkinson-White syndrome status post radiofrequency catheter ablation in 2001. We saw him in consultation in early May with a two week history of rapid heart rate. The EKG demonstrated atrial fib.  Approximately 2 weeks ago, he was seen for a routine physical on his work site and was told by the physician assistant that his heart rate was fast and irregular and it was recommended that he followup with his primary care provider. He did not necessarily notice tachypalpitations. He schedule an appointment to be seen by his primary care provider today and after an ECG was performed, he was noted to be in atrial fibrillation with rapid ventricular response. He was sent to the [facility] emergency department for further evaluation and subsequently transferred here for cardiology admission. Of note, his BNP was mildly elevated at [facility] at 707.7, and chest x-ray showed cardiomegaly with central vascular congestion. He was given a dose of IV Lasix as well as a 20 mg IV diltiazem bolus with improvement in rate control prior to leaving [employer]. Currently, he is symptom-free. He remained in atrial fibrillation with rates in the low 100s.  Ejection fraction on echo was normal. There were no significant valvular abnormalities. He was started on Xarelto. However, did undergo TEE cardioversion . He was in sinus rhythm briefly. However, he returned to atrial fibrillation prior to leaving the hospital. Also was noted at that on TEE to have an EF of 20%. He still feels very fatigued. He feels like his heart is beating fast at times. He does have dyspnea with exertion which is unchanged from previous. He denies chest pain, neck or arm discomfort. He has had no presyncope or syncope. Is not having any new PND or orthopnea.  His most pressing complaint continues to be  fatigue. This probably is long-standing but may be worse over the last several weeks. He does have a history of sleep apnea but at surgery for this and hasn't used CPAP. His wife says he snores sometimes. He doesn't think he has restful sleep. He does move in his sleep. He has not had a followup sleep study in years.   No Known Allergies  Current Outpatient Prescriptions   Medication  Sig  Dispense  Refill   .  Albuterol Sulfate (PROVENTIL HFA IN)  Inhale into the lungs. Inhale two puffs po q 4 hours prn     .  atorvastatin (LIPITOR) 10 MG tablet  Take 10 mg by mouth daily.     .  Cholecalciferol (VITAMIN D) 2000 UNITS CAPS  Take by mouth daily.     .  Elastic Bandages & Supports (B-4 MED COMPRESSION HOSE MENS) MISC  1 each by Does not apply route as directed.  1 each  0   .  enalapril (VASOTEC) 20 MG tablet  Take 20 mg by mouth 2 (two) times daily.     .  Fluticasone-Salmeterol (ADVAIR DISKUS) 250-50 MCG/DOSE AEPB  Inhale 1 puff into the lungs every 12 (twelve) hours.     .  meloxicam (MOBIC) 15 MG tablet  Take 15 mg by mouth daily.     .  metoprolol tartrate (LOPRESSOR) 25 MG tablet  Take 1 tablet (25 mg total) by mouth 2 (two) times daily.  60 tablet  11   .  omeprazole (PRILOSEC) 20 MG capsule  Take 20 mg by  mouth daily.     Marland Kitchen  oxybutynin (DITROPAN) 5 MG tablet  Take 5 mg by mouth 2 (two) times daily.     .  Rivaroxaban (XARELTO) 20 MG TABS  Take 1 tablet (20 mg total) by mouth daily with supper.  30 tablet  3   .  tadalafil (CIALIS) 5 MG tablet  Take 5 mg by mouth daily as needed for erectile dysfunction.     .  Vitamin D, Ergocalciferol, (DRISDOL) 50000 UNITS CAPS  Take 50,000 Units by mouth every 7 (seven) days. Takes every Saturday      No current facility-administered medications for this visit.    Past Medical History   Diagnosis  Date   .  Hypertension    .  High cholesterol    .  Asthma    .  GERD (gastroesophageal reflux disease)    .  History of DVT (deep vein thrombosis)        a. left leg   .  Wolff-Parkinson-White (WPW) syndrome      a. s/p RFCA 2001   .  Atrial fibrillation      Dx 08/2012;   .  Sleep apnea    .  Reflux     Past Surgical History   Procedure  Laterality  Date   .  Back surgery     .  Tonsillectomy     .  Stomach surgery       reflux, fundoplication   .  Nose surgery       sleep apnea surgery   .  Cardiac surgery     .  Tee without cardioversion  N/A  08/25/2012     Procedure: TRANSESOPHAGEAL ECHOCARDIOGRAM (TEE); Surgeon: Vesta Mixer, MD; Location: [facility] ENDOSCOPY; Service: Cardiovascular; Laterality: N/A;   .  Cardioversion  N/A  08/25/2012     Procedure: CARDIOVERSION; Surgeon: Vesta Mixer, MD; Location: [facility] ENDOSCOPY; Service: Cardiovascular; Laterality: N/A;    Family History   Problem  Relation  Age of Onset   .  CAD  Father  18   .  Diabetes  Father    .  Heart attack  Father    .  Diabetes  Mother    .  Diabetes  Brother     History    Social History   .  Marital Status:  Married     Spouse Name:  N/A     Number of Children:  2   .  Years of Education:  N/A    Occupational History   .   [employer]    Social History Main Topics   .  Smoking status:  Former Smoker     Quit date:  08/06/2010   .  Smokeless tobacco:  Not on file   .  Alcohol Use:  No   .  Drug Use:  No   .  Sexually Active:  Not on file    Other Topics  Concern   .  Not on file    Social History Narrative   .  No narrative on file    ROS: As stated in the HPI and negative for all other systems.   PHYSICAL EXAM  BP 153/107  Pulse 93  Ht 6\' 1"  (1.854 m)  Wt 279 lb (126.554 kg)  BMI 36.82 kg/m2  GENERAL: Well appearing  HEENT: Pupils equal round and reactive, fundi not visualized, oral mucosa unremarkable  NECK: No jugular venous distention, waveform within normal  limits, carotid upstroke brisk and symmetric, no bruits, no thyromegaly  LYMPHATICS: No cervical, inguinal adenopathy  LUNGS: Clear to auscultation bilaterally  BACK: No  CVA tenderness  CHEST: Unremarkable  HEART: PMI not displaced or sustained,S1 and S2 within normal limits, no S3,no clicks, no rubs, no murmurs, irregular  ABD: Flat, positive bowel sounds normal in frequency in pitch, no bruits, no rebound, no guarding, no midline pulsatile mass, no hepatomegaly, no splenomegaly  EXT: 2 plus pulses throughout, mild bilateral lower extremityedema, no cyanosis no clubbing  SKIN: No rashes no nodules  NEURO: Cranial nerves II through XII grossly intact, motor grossly intact throughout  PSYCH: Cognitively intact, oriented to person place and time  EKG: Atrial fibrillation, rate 107, premature ectopic complexes, axis within normal limits, RSR prime V1, QTC within normal limits, no acute ST-T wave changes.   ASSESSMENT AND PLAN   ATRIAL FIBRILLATION: Given the patient's cardiomyopathy which is rate related and his symptoms he needs to maintain sinus rhythm. I will have him come to the hospital for admission tomorrow to begin Tikosyn. We will be unable to check potassium and magnesium tonight be done in the morning. I have notified our team of his admission and orders will need to be written in the morning.   HTN: The blood pressure is slightly elevated today. He may need med titration to his hospitalization though his blood pressure has not been this elevated at previous readings.   OBESITY: I will continue to have discussions with him about this with specific instructions for weight loss.   FATIGUE: I suspect that this is in part related to poor sleep habits. I wonder if there is not ongoing sleep apnea or other sleep disturbance. He is to be scheduled him for a sleep study.   EDEMA: Unfortunately his job requires that he is on his feet during the entire shift. He did pick up some compression stockings at the last visit.   CARDIOMYOPATHY: This is very likely rate related as above as it was normal recently. I would repeat a transthoracic echo on admission to confirm  the TEE EF.  Dr. Rollene Rotunda, MD, [facility] (Office note from 5/30)   Attending followup note:  Patient seen and examined. He presents around 4 PM on 5/31 for elective admission forTikosyn. Remains in atrial fibrillation, took his morning medications. No lab work or ECGs available as yet to assist with determining Tikosyn initiation dose. Plan will be to continue home regimen, uptitrate beta blocker for better heart rate control. Depending on baseline lab work and ECG will initiate Tikosyn per pharmacy protocol. Routine observation from there, followup complete echocardiogram to reassess LV function, and then determine whether repeat cardioversion attempt is necessary.  Jonelle Sidle, M.D., F.A.C.C.

## 2012-09-03 NOTE — Progress Notes (Signed)
EKG obtained per MD order, MD on floor to see EKG results. No new orders at this time. Will continue to monitor.

## 2012-09-03 NOTE — Progress Notes (Signed)
I asked Dr. Graciela Husbands to review the patient's recent tracings to ensure that QTc was not felt to be too long to consider Tikosyn. He assured me that he thought that it was appropriate to begin Tikosyn. Based on electrolytes and renal function, will start at standard full dose. Followup ECG in AM.

## 2012-09-04 ENCOUNTER — Other Ambulatory Visit: Payer: Self-pay

## 2012-09-04 LAB — BASIC METABOLIC PANEL
CO2: 27 mEq/L (ref 19–32)
Calcium: 9.4 mg/dL (ref 8.4–10.5)
Creatinine, Ser: 1.07 mg/dL (ref 0.50–1.35)

## 2012-09-04 LAB — MAGNESIUM: Magnesium: 2.1 mg/dL (ref 1.5–2.5)

## 2012-09-04 MED ORDER — DIPHENHYDRAMINE HCL 25 MG PO CAPS
25.0000 mg | ORAL_CAPSULE | Freq: Once | ORAL | Status: AC
  Administered 2012-09-04: 25 mg via ORAL
  Filled 2012-09-04: qty 1

## 2012-09-04 MED ORDER — DOFETILIDE 250 MCG PO CAPS
250.0000 ug | ORAL_CAPSULE | Freq: Two times a day (BID) | ORAL | Status: DC
  Administered 2012-09-04 – 2012-09-06 (×5): 250 ug via ORAL
  Filled 2012-09-04 (×6): qty 1

## 2012-09-04 MED ORDER — POTASSIUM CHLORIDE CRYS ER 20 MEQ PO TBCR
30.0000 meq | EXTENDED_RELEASE_TABLET | Freq: Once | ORAL | Status: AC
  Administered 2012-09-04: 30 meq via ORAL
  Filled 2012-09-04: qty 1

## 2012-09-04 NOTE — Progress Notes (Signed)
EKG Completed. Pt's QTC is 441. MD aware. Will continue to monitor.

## 2012-09-04 NOTE — Progress Notes (Signed)
EKG completed. Pt's QTC is 449. Will continue to monitor.

## 2012-09-04 NOTE — Progress Notes (Signed)
Dr. Terressa Koyanagi, on call for Dr. Diona Browner, called to clarify order to start Tikosyn tonight. After reviewing chart, we noticed a note from Dr. Diona Browner stating Tikosyn could begin tonight, so it was started per pharmacy order.  Alfonso Ellis, RN

## 2012-09-04 NOTE — Progress Notes (Signed)
   Primary cardiologist: Dr. Rollene Rotunda  Subjective:   Did not sleep well. No chest pain or breathlessness.   Objective:   Temp:  [97.4 F (36.3 C)-98.1 F (36.7 C)] 97.4 F (36.3 C) (06/01 0412) Pulse Rate:  [75-116] 75 (06/01 0412) Resp:  [18-20] 20 (06/01 0412) BP: (132-154)/(91-95) 132/91 mmHg (06/01 0412) SpO2:  [96 %-99 %] 99 % (06/01 0412) Weight:  [277 lb 9 oz (125.9 kg)] 277 lb 9 oz (125.9 kg) (05/31 1610) Last BM Date: 09/04/12  Filed Weights   09/03/12 1610  Weight: 277 lb 9 oz (125.9 kg)    Intake/Output Summary (Last 24 hours) at 09/04/12 0914 Last data filed at 09/04/12 0812  Gross per 24 hour  Intake    360 ml  Output   1850 ml  Net  -1490 ml   Telemetry: Persistent atrial fibrillation.  Exam:  General: Comfortable.  Lungs: Clear, nonlabored.  Cardiac: Irregularly irregular, no gallop.  Extremities: Stasis and mild edema.  Lab Results:  Basic Metabolic Panel:  Recent Labs Lab 09/03/12 1615 09/04/12 0500  NA 139 139  K 4.3 3.8  CL 102 102  CO2 26 27  GLUCOSE 98 119*  BUN 17 17  CREATININE 1.03 1.07  CALCIUM 9.3 9.4  MG 2.1 2.1    CBC:  Recent Labs Lab 09/03/12 1615  WBC 10.0  HGB 15.3  HCT 43.7  MCV 91.2  PLT 237    BNP:  Recent Labs  08/05/12 1150  PROBNP 707.7*    ECG: Atrial fibrillation with QTc measuring 459 ms to 510 ms.   Medications:   Scheduled Medications: . atorvastatin  10 mg Oral Daily  . dofetilide  500 mcg Oral Q12H  . enalapril  20 mg Oral BID  . meloxicam  15 mg Oral Daily  . metoprolol tartrate  50 mg Oral BID  . mometasone-formoterol  2 puff Inhalation BID  . oxybutynin  5 mg Oral BID  . pantoprazole  40 mg Oral Daily  . Rivaroxaban  20 mg Oral Q supper  . sodium chloride  3 mL Intravenous Q12H      PRN Medications:  sodium chloride, albuterol, sodium chloride   Assessment:   1. Persistent atrial fibrillation. Admitted by Dr. Antoine Poche to Tikosyn.  2. 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 Possibly tachycardia mediated.  3. Hypertension.  4. History of WPW s/p ablation 2001.   Plan/Discussion:    Replete potassium this AM. Magnesium is normal. I am still concerned about the QTc - Dr. Graciela Husbands reviewed the tracings yesterday and indicated appropriate to initiate Tikosyn. Measurements are higher today as noted above. I am reducing Tikosyn to 250 mcg dose for now. Dr. Antoine Poche to take over and guide treatment from here. I have also increased beta blocker dose for better heart rate control.   Jonelle Sidle, M.D., F.A.C.C.

## 2012-09-05 ENCOUNTER — Other Ambulatory Visit: Payer: Self-pay

## 2012-09-05 DIAGNOSIS — E876 Hypokalemia: Secondary | ICD-10-CM

## 2012-09-05 DIAGNOSIS — I456 Pre-excitation syndrome: Secondary | ICD-10-CM | POA: Diagnosis present

## 2012-09-05 DIAGNOSIS — I059 Rheumatic mitral valve disease, unspecified: Secondary | ICD-10-CM

## 2012-09-05 LAB — BASIC METABOLIC PANEL
BUN: 17 mg/dL (ref 6–23)
Chloride: 101 mEq/L (ref 96–112)
GFR calc Af Amer: 89 mL/min — ABNORMAL LOW (ref >90)
Potassium: 4 mEq/L (ref 3.5–5.1)

## 2012-09-05 MED ORDER — POTASSIUM CHLORIDE CRYS ER 20 MEQ PO TBCR
40.0000 meq | EXTENDED_RELEASE_TABLET | Freq: Once | ORAL | Status: AC
  Administered 2012-09-05: 40 meq via ORAL
  Filled 2012-09-05: qty 2

## 2012-09-05 NOTE — Progress Notes (Addendum)
Patient ID: Austin Woods, male   DOB: [birth year], 47 y.o.   MRN: [id number]   SUBJECTIVE:  The patient is feeling a little bit better. We have been trying to load Tikosyn. It is difficult to assess his QT interval with atrial fibrillation. The initial EKGs were reviewed by Dr. Graciela Husbands as well as Dr. Diona Browner. Initially he was started on 500 mg. It was then decided by Dr. Diona Browner to lower him to 250 mg. Total dosing so far is 500 mg on the evening of May 31. He received 2 doses of 250 mg during the day of June 1. He will be receiving 2 doses of 250 mg today June 2.  I have reviewed all the specifics of his prior therapy. He had an outpatient transesophageal echo on Aug 25, 2012. There was no atrial clot at that time. It was noted that his ejection fraction seemed to be 20%, when it had previously been in the normal range. It was felt that he probably had a rate related cardiomyopathy. On May 22 cardioversion was done immediately after his transesophageal echo. He cardioverted briefly but did not hold. Therefore when he was seen in the office recently by Dr. Antoine Poche, plans were made for admission, Tikosyn load, and repeat cardioversion if he did not convert to sinus rhythm spontaneously. I spoke with the patient carefully about his dosing of Xarelto. He has been receiving this drug very reliably every day since Aug 05, 2012. He is receiving in the hospital.   Filed Vitals:   09/04/12 1600 09/04/12 1756 09/04/12 1933 09/05/12 0423  BP: 132/90  148/97 132/95  Pulse: 100 97 72 72  Temp:   98 F (36.7 C) 98 F (36.7 C)  TempSrc:   Oral Oral  Resp: 20  20 20   Height:      Weight:      SpO2:   97% 97%    Intake/Output Summary (Last 24 hours) at 09/05/12 0757 Last data filed at 09/05/12 0755  Gross per 24 hour  Intake   1440 ml  Output   3051 ml  Net  -1611 ml    LABS: Basic Metabolic Panel:  Recent Labs  30/86/57 0500 09/05/12 0445  NA 139 138  K 3.8 4.0  CL 102 101  CO2 27 28    GLUCOSE 119* 134*  BUN 17 17  CREATININE 1.07 1.10  CALCIUM 9.4 9.6  MG 2.1 2.2   Liver Function Tests: No results found for this basename: AST, ALT, ALKPHOS, BILITOT, PROT, ALBUMIN,  in the last 72 hours No results found for this basename: LIPASE, AMYLASE,  in the last 72 hours CBC:  Recent Labs  09/03/12 1615  WBC 10.0  HGB 15.3  HCT 43.7  MCV 91.2  PLT 237   Cardiac Enzymes: No results found for this basename: CKTOTAL, CKMB, CKMBINDEX, TROPONINI,  in the last 72 hours BNP: No components found with this basename: POCBNP,  D-Dimer: No results found for this basename: DDIMER,  in the last 72 hours Hemoglobin A1C: No results found for this basename: HGBA1C,  in the last 72 hours Fasting Lipid Panel: No results found for this basename: CHOL, HDL, LDLCALC, TRIG, CHOLHDL, LDLDIRECT,  in the last 72 hours Thyroid Function Tests: No results found for this basename: TSH, T4TOTAL, FREET3, T3FREE, THYROIDAB,  in the last 72 hours  RADIOLOGY: No results found.  PHYSICAL EXAM   Patient is oriented to person time and place. Affect is normal. Lungs are clear. Respiratory  effort is nonlabored. Her neck exam reveals S1 and S2. The abdomen is soft. There is no significant peripheral edema. There no musculoskeletal deformities. There are no skin rashes. His head is atraumatic.   TELEMETRY: I have reviewed telemetry today September 05, 2012. He continues in atrial fibrillation. I've also reviewed this morning's EKG. Corrected QT interval is in the range of 500 ms. This is the range has been throughout the hospitalization.   ASSESSMENT AND PLAN:    Atrial fibrillation     He has continued atrial fibrillation. We will continue his Tikosyn load today. I will make him n.p.o. For tomorrow. If he has not converted it will be appropriate to proceed with cardioversion later in the morning tomorrow. As I outlined above, he has been carefully anticoagulated for one month. In addition we know from May  22, that he had no clot in his left atrium by TEE at that time.    Secondary cardiomyopathy, unspecified     The initial plan was to do another 2-D echo to reassess his LV function compared to what seemed to be present at the time of his TEE. This will be ordered for today.    Essential hypertension, benign    WPW (Wolff-Parkinson-White syndrome)      By history this was ablated in the past.    Hypokalemia    The patient's potassium today is 4.0. I will give him an additional 40 mEq of potassium today and check his labs tomorrow. He may need to be on daily potassium.   Tikosyn Load    We will continue to load him with Tikosyn at 250 mg orally twice a day. Hopefully we'll have a better idea of his QT interval after he is cardioverted.  As part of this morning's evaluation I spent an extensive amount of time reviewing all the specifics of his prior procedures and his anticoagulation to be sure that it would be safe to proceed with cardioversion tomorrow. I've had full discussions with him about this. I also returned to the room and spoke to the patient's wife in person.  Willa Rough 09/05/2012 7:57 AM

## 2012-09-05 NOTE — Progress Notes (Signed)
UR COMPLETED  

## 2012-09-05 NOTE — Progress Notes (Signed)
  Echocardiogram 2D Echocardiogram has been performed.  Georgian Co 09/05/2012, 1:16 PM

## 2012-09-06 ENCOUNTER — Inpatient Hospital Stay (HOSPITAL_COMMUNITY): Payer: BC Managed Care – PPO | Admitting: Anesthesiology

## 2012-09-06 ENCOUNTER — Encounter (HOSPITAL_COMMUNITY): Payer: Self-pay | Admitting: Anesthesiology

## 2012-09-06 ENCOUNTER — Encounter (HOSPITAL_COMMUNITY)
Admission: AD | Disposition: A | Discharge status: Home / Self Care | Payer: Self-pay | Source: Ambulatory Visit | Attending: Cardiology

## 2012-09-06 DIAGNOSIS — I4891 Unspecified atrial fibrillation: Secondary | ICD-10-CM

## 2012-09-06 HISTORY — PX: CARDIOVERSION: SHX1299

## 2012-09-06 LAB — BASIC METABOLIC PANEL
Chloride: 103 mEq/L (ref 96–112)
GFR calc Af Amer: 77 mL/min — ABNORMAL LOW (ref >90)
Potassium: 4.3 mEq/L (ref 3.5–5.1)

## 2012-09-06 LAB — MAGNESIUM: Magnesium: 2 mg/dL (ref 1.5–2.5)

## 2012-09-06 SURGERY — CARDIOVERSION
Anesthesia: General | Wound class: Clean

## 2012-09-06 MED ORDER — DOFETILIDE 250 MCG PO CAPS: 250.0000 ug | ORAL_CAPSULE | Freq: Two times a day (BID) | ORAL | Status: DC

## 2012-09-06 MED ORDER — SODIUM CHLORIDE 0.9 % IV SOLN: 250.0000 mL | INTRAVENOUS | Status: DC

## 2012-09-06 MED ORDER — SODIUM CHLORIDE 0.9 % IV SOLN
INTRAVENOUS | Status: DC | PRN
  Administered 2012-09-06: 10:00:00 via INTRAVENOUS

## 2012-09-06 MED ORDER — SODIUM CHLORIDE 0.9 % IJ SOLN
3.0000 mL | Freq: Two times a day (BID) | INTRAMUSCULAR | Status: DC
  Administered 2012-09-06: 3 mL via INTRAVENOUS

## 2012-09-06 MED ORDER — PROPOFOL 10 MG/ML IV BOLUS
INTRAVENOUS | Status: DC | PRN
  Administered 2012-09-06: 60 mg via INTRAVENOUS

## 2012-09-06 MED ORDER — SODIUM CHLORIDE 0.9 % IJ SOLN: 3.0000 mL | INTRAMUSCULAR | Status: DC | PRN

## 2012-09-06 NOTE — Transfer of Care (Signed)
Immediate Anesthesia Transfer of Care Note  Patient: Austin Woods  Procedure(s) Performed: Procedure(s): CARDIOVERSION/Bedside (N/A)  Patient Location: Nursing Unit  Anesthesia Type:General  Level of Consciousness: awake, alert , oriented and patient cooperative  Airway & Oxygen Therapy: Patient Spontanous Breathing  Post-op Assessment: Report given to Fayrene Fearing, RN  Post vital signs: Reviewed and stable  Complications: No apparent anesthesia complications

## 2012-09-06 NOTE — Progress Notes (Signed)
Patient ID: Austin Woods, male   DOB: [birth year], 47 y.o.   MRN: [id number]  SUBJECTIVE The patient remains in atrial fibrillation. His rate is controlled. He has been receiving his Tikosyn at 250 mg every 12 hours. It is been difficult to assess his QT interval with his atrial fibrillation. On some 12 leads QT corrected interval has been normal in others it has been prolonged. I am in agreement That he has had continued dosing of the 250 mg of Tikosyn twice daily. We will proceed with cardioversion this morning. He is currently n.p.o. He will receive his morning meds with small sips of liquids. I will look into the timing of his cardioversion later this morning or early this afternoon. I've asked that his morning dose of Tikosyn be given now in the 7:30 range which is earlier than his prior dosing. I feel this is safe and best for the planning of his cardioversion later as the day goes on.   Filed Vitals:   09/05/12 0423 09/05/12 1418 09/05/12 2138 09/06/12 0454  BP: 132/95 137/91 131/81 145/87  Pulse: 72 92 66 68  Temp: 98 F (36.7 C) 97.9 F (36.6 C) 98.1 F (36.7 C) 97.8 F (36.6 C)  TempSrc: Oral Oral Oral Oral  Resp: 20 18 19 18   Height:      Weight:      SpO2: 97% 95% 96% 97%    Intake/Output Summary (Last 24 hours) at 09/06/12 0731 Last data filed at 09/06/12 0455  Gross per 24 hour  Intake    240 ml  Output   1925 ml  Net  -1685 ml    LABS: Basic Metabolic Panel:  Recent Labs  40/98/11 0445 09/06/12 0520  NA 138 142  K 4.0 4.3  CL 101 103  CO2 28 28  GLUCOSE 134* 123*  BUN 17 19  CREATININE 1.10 1.25  CALCIUM 9.6 9.7  MG 2.2 2.0   Liver Function Tests: No results found for this basename: AST, ALT, ALKPHOS, BILITOT, PROT, ALBUMIN,  in the last 72 hours No results found for this basename: LIPASE, AMYLASE,  in the last 72 hours CBC:  Recent Labs  09/03/12 1615  WBC 10.0  HGB 15.3  HCT 43.7  MCV 91.2  PLT 237   Cardiac Enzymes: No results found for  this basename: CKTOTAL, CKMB, CKMBINDEX, TROPONINI,  in the last 72 hours BNP: No components found with this basename: POCBNP,  D-Dimer: No results found for this basename: DDIMER,  in the last 72 hours Hemoglobin A1C: No results found for this basename: HGBA1C,  in the last 72 hours Fasting Lipid Panel: No results found for this basename: CHOL, HDL, LDLCALC, TRIG, CHOLHDL, LDLDIRECT,  in the last 72 hours Thyroid Function Tests: No results found for this basename: TSH, T4TOTAL, FREET3, T3FREE, THYROIDAB,  in the last 72 hours  RADIOLOGY: No results found.  PHYSICAL EXAM   Patient is overweight. He stable. He is oriented to person time and place. Affect is normal. There is no jugulovenous distention. Lungs are clear. Respiratory effort is nonlabored. Cardiac exam reveals S1 and S2. There no clicks or significant murmurs. The abdomen is soft. There is no peripheral edema.   TELEMETRY: I have reviewed telemetry today September 06, 2012. There is atrial fibrillation with a controlled rate.   ASSESSMENT AND PLAN:    Atrial fibrillation     We'll proceed with cardioversion later today. His Tikosyn load has been continued at 250 mg every 12 hours.  It has been difficult to assess his QT interval. This will be checked carefully after he is cardioverted before any further decisions are made.    Secondary cardiomyopathy, unspecified     2-D echo yesterday showed that his EF is better than at the time of the TEE but not back to normal. His EF is in the 35-40% range. Hopefully this will continue to improve as he is taken back to sinus rhythm.    Essential hypertension, benign      Blood pressure is stable for now.    WPW (Wolff-Parkinson-White syndrome)    Hypokalemia    Potassium is up to 4.3 today. This is a good range for him on the Tikosyn.  The plan will be to proceed with cardioversion later today. I will be available to do this procedure.    Willa Rough 09/06/2012 7:31 AM

## 2012-09-06 NOTE — Progress Notes (Signed)
Patient ID: Austin Woods, male   DOB: 05/31/1965, 47 y.o.   MRN: [id number]    The patient has now been successfully cardioverted. The corrected QT interval now is 491 ms. The patient is stable. He can be discharged home this afternoon. He needs to be seen in the office within one week by Dr. Antoine Poche or someone else on our team to reassess his rhythm. Hopefully he'll be maintaining sinus rhythm. If not, further plan should be made as the patient appears to have an atrial fibrillation related cardiomyopathy.  He has received 6 full doses of Tikosyn in the hospital. This is the appropriate number of doses to be sure that he is ready to go home.  Jerral Bonito, MD

## 2012-09-06 NOTE — CV Procedure (Signed)
   Cardioversion:  The patient was carefully prepared for cardioversion. He was n.p.o. Consent was properly obtained. Time out was taken to verify all data. It is been documented that the patient is completely anticoagulated.  Anesthesia was present. The patient was given 60 mg of IV propofol.  Anterior posterior pads were placed using the biphasic defibrillator.  The patient received one shock with 120 J of biphasic energy. He converted immediately to normal sinus rhythm. Only one shock was required. The patient is quite stable and waking up well.    Successful cardioversion. 12-lead EKG will be reviewed shortly. If the patient's QT interval is okay, he may be allowed to be discharged home later today as he is now received 6 doses of Tikosyn. I would check to be sure that 6 doses is enough.  Jerral Bonito, MD

## 2012-09-06 NOTE — Anesthesia Preprocedure Evaluation (Addendum)
Anesthesia Evaluation  Patient identified by MRN, date of birth, ID band Patient awake    Reviewed: Allergy & Precautions, H&P , NPO status , Patient's Chart, lab work & pertinent test results  Airway Mallampati: II TM Distance: >3 FB Neck ROM: full    Dental  (+) Dental Advisory Given   Pulmonary asthma , sleep apnea ,          Cardiovascular hypertension, +CHF + dysrhythmias Atrial Fibrillation Rhythm:irregular Rate:Tachycardia     Neuro/Psych    GI/Hepatic GERD-  ,  Endo/Other    Renal/GU      Musculoskeletal   Abdominal   Peds  Hematology   Anesthesia Other Findings   Reproductive/Obstetrics                          Anesthesia Physical Anesthesia Plan  ASA: III  Anesthesia Plan: General   Post-op Pain Management:    Induction: Intravenous  Airway Management Planned: Mask  Additional Equipment:   Intra-op Plan:   Post-operative Plan:   Informed Consent: I have reviewed the patients History and Physical, chart, labs and discussed the procedure including the risks, benefits and alternatives for the proposed anesthesia with the patient or authorized representative who has indicated his/her understanding and acceptance.     Plan Discussed with: CRNA, Anesthesiologist and Surgeon  Anesthesia Plan Comments:         Anesthesia Quick Evaluation

## 2012-09-06 NOTE — Progress Notes (Signed)
Pt on Tikosyn for his AFIB. QTC obtained via EKG early this morning and showed 535. Dr Myrtis Ser made aware and no new order received.

## 2012-09-06 NOTE — Care Management Note (Signed)
    Page 1 of 1   09/06/2012     2:55:36 PM   CARE MANAGEMENT NOTE 09/06/2012  Patient:  Austin Woods, Austin Woods   Account Number:  [id number]  Date Initiated:  09/06/2012  Documentation initiated by:  Servando Kyllonen  Subjective/Objective Assessment:   PT WITH AFIB ADM ON 09/03/12 FOR TIKOSYN LOADING.  PTA, PT INDEPENDENT, LIVES WITH SPOUSE.     Action/Plan:   MET WITH PT AND WIFE TO DISCUSS OBTAINING TIKOSYN POST-DC.   Anticipated DC Date:  09/06/2012   Anticipated DC Plan:  HOME/SELF CARE      DC Planning Services  CM consult  Medication Assistance      Choice offered to / List presented to:             Status of service:  Completed, signed off Medicare Important Message given?   (If response is "NO", the following Medicare IM given date fields will be blank) Date Medicare IM given:   Date Additional Medicare IM given:    Discharge Disposition:  HOME/SELF CARE  Per UR Regulation:  Reviewed for med. necessity/level of care/duration of stay  If discussed at Long Length of Stay Meetings, dates discussed:    Comments:  09/06/12 Leanor Voris,RN,BSN [phone] PT'S PHARMACY, [facility] [address] IN [place], DOES NOT DISPENSE TIKOSYN.  CHECKED WITH [facility], PER PT CHOICE, AND THEY WILL DISPENSE TIKOSYN, BUT DO NOT HAVE CURRENTLY IN STOCK.  WILL NEED TO ORDER, AND THIS WILL TAKE AT LEAST 2 DAYS.  PT TO RECEIVE ONE WEEK'S SUPPLY OF TIKOSYN PRIOR TO DC.  PT GIVEN XARELTO COPAY CARD, AND TIKOSYN COPAY CARD TO REDUCE COPAY AMTS TO $10/MONTH.  HE AND WIFE ARE APPRECIATIVE OF ASSISTANCE.

## 2012-09-06 NOTE — Progress Notes (Signed)
Discharged to home with family office visits in place teaching done  

## 2012-09-06 NOTE — Discharge Summary (Signed)
Patient ID: Austin Woods,  MRN: [id number], DOB/AGE: 1965-10-15 47 y.o.  Admit date: 09/03/2012 Discharge date: 09/06/2012  Primary Care Provider: Harlow Asa Primary Cardiologist: J. Hochrein, MD [facility])  Discharge Diagnoses Principal Problem:   Atrial fibrillation  **s/p tikosyn loading ( q 12h) and DCCV this admission.  Active Problems:   Secondary cardiomyopathy, unspecified   Essential hypertension, benign   WPW (Wolff-Parkinson-White syndrome)   Hypokalemia  Allergies No Known Allergies  Procedures  2D Echocardiogram 6.2.2014 Study Conclusions  - Left ventricle: The cavity size was mildly dilated. Wall thickness was normal. Systolic function was moderately reduced. The estimated ejection fraction was in the range of 35% to 40%. Wall motion was normal; there were no regional wall motion abnormalities. - Mitral valve: Mild regurgitation. - Left atrium: The atrium was mildly to moderately dilated. - Right atrium: The atrium was mildly dilated. _____________  6.3.2014 DCCV  The patient received one shock with 120 J of biphasic energy. He converted immediately to normal sinus rhythm. Only one shock was required. The patient is quite stable and waking up well. _____________  History of Present Illness  47 y/o male with recent diagnosis of afib in May 2014.  He was initially placed on beta blocker and calcium channel blocker therapy along with xarelto for anticoagulation with a plan for dccv in 3 weeks time however upon clinic f/u he was feeling markedly fatigued and he subsequently underwent TEE, which showed reduced LV fxn and cardioversion.  Unfortunately, he was unable to maintain sinus rhythm and continued to experience fatigue despite reasonable rate control.  Decision was made to pursue admission for tikosyn loading and repeat dccv if necessary.  Hospital Course  Pt was admitted 5/31 and was initially placed on tikosyn 500 mcg q 12h.  His QTc was difficult to  calculate because of the morphology of his afib but his QTc was felt to have widened and as a result his dose was reduced to 250 mcg q 12h.  QTc was felt to be stable however he remained in atrial fibrillation.  After his sixth dose this AM, he underwent successful DCCV.  QTc remains stable and he will be discharged home this afternoon in good condition.  We have arranged for f/u bmet and Mg next week along with early clinic f/u for ECG evaluation.  Discharge Vitals Blood pressure 126/80, pulse 62, temperature 97.8 F (36.6 C), temperature source Oral, resp. rate 20, height 6\' 1"  (1.854 m), weight 277 lb 9 oz (125.9 kg), SpO2 99.00%.  Filed Weights   09/03/12 1610  Weight: 277 lb 9 oz (125.9 kg)   Labs  CBC  Recent Labs  09/03/12 1615  WBC 10.0  HGB 15.3  HCT 43.7  MCV 91.2  PLT 237   Basic Metabolic Panel  Recent Labs  09/05/12 0445 09/06/12 0520  NA 138 142  K 4.0 4.3  CL 101 103  CO2 28 28  GLUCOSE 134* 123*  BUN 17 19  CREATININE 1.10 1.25  CALCIUM 9.6 9.7  MG 2.2 2.0   Disposition  Pt is being discharged home today in good condition.  Follow-up Plans & Appointments  Follow-up Information   Follow up with SERPE, EUGENE, PA-C On 09/16/2012. (1:40 PM)    Contact information:   [address], [address] [place] [address] [phone]       Follow up with [facility] On 09/15/2012. (blood chemistry and magnesium)      Discharge Medications  Medication List    STOP taking these medications       diltiazem 120 MG tablet  Commonly known as:  CARDIZEM      TAKE these medications       ADVAIR DISKUS 250-50 MCG/DOSE Aepb  Generic drug:  Fluticasone-Salmeterol  Inhale 1 puff into the lungs every 12 (twelve) hours.     atorvastatin 10 MG tablet  Commonly known as:  LIPITOR  Take 10 mg by mouth daily.     B-4 Med Compression Hose Mens Misc  1 each by Does not apply route as directed.     dofetilide 250 MCG capsule  Commonly known as:   TIKOSYN  Take 1 capsule (250 mcg total) by mouth every 12 (twelve) hours.     enalapril 20 MG tablet  Commonly known as:  VASOTEC  Take 20 mg by mouth 2 (two) times daily.     meloxicam 15 MG tablet  Commonly known as:  MOBIC  Take 15 mg by mouth daily.     metoprolol tartrate 25 MG tablet  Commonly known as:  LOPRESSOR  Take 1 tablet (25 mg total) by mouth 2 (two) times daily.     omeprazole 20 MG capsule  Commonly known as:  PRILOSEC  Take 20 mg by mouth daily.     oxybutynin 5 MG tablet  Commonly known as:  DITROPAN  Take 5 mg by mouth 2 (two) times daily.     PROVENTIL HFA IN  Inhale into the lungs. Inhale two puffs po q 4 hours prn     Rivaroxaban 20 MG Tabs  Commonly known as:  XARELTO  Take 1 tablet (20 mg total) by mouth daily with supper.     tadalafil 5 MG tablet  Commonly known as:  CIALIS  Take 5 mg by mouth daily as needed for erectile dysfunction.     Vitamin D (Ergocalciferol) 50000 UNITS Caps  Commonly known as:  DRISDOL  Take 50,000 Units by mouth every 7 (seven) days. Takes every Saturday     Vitamin D 2000 UNITS Caps  Take by mouth daily.       Outstanding Labs/Studies  Bmet/Mg/ECG to be done next week.  Duration of Discharge Encounter   Greater than 30 minutes including physician time.  Signed, Nicolasa Ducking NP 09/06/2012, 4:02 PM   Patient seen and examined. I agree with the assessment and plan as detailed above. See also my additional thoughts below.   I made the decision for the patient to be discharged. Please refer to my progress notes and my cardioversion note from today.  Willa Rough, MD, [facility] 09/06/2012 4:18 PM

## 2012-09-06 NOTE — Anesthesia Postprocedure Evaluation (Signed)
  Anesthesia Post-op Note  Patient: Austin Woods  Procedure(s) Performed: Procedure(s): CARDIOVERSION/Bedside (N/A)  Patient Location: Nursing Unit  Anesthesia Type:General  Level of Consciousness: awake, alert , oriented, sedated and patient cooperative  Airway and Oxygen Therapy: Patient Spontanous Breathing  Post-op Pain: none  Post-op Assessment: Post-op Vital signs reviewed, Patient's Cardiovascular Status Stable, Respiratory Function Stable, Patent Airway, No signs of Nausea or vomiting and Pain level controlled  Post-op Vital Signs: stable  Complications: No apparent anesthesia complications

## 2012-09-07 ENCOUNTER — Encounter: Payer: Self-pay | Admitting: *Deleted

## 2012-09-07 ENCOUNTER — Telehealth: Payer: Self-pay | Admitting: *Deleted

## 2012-09-07 NOTE — Telephone Encounter (Signed)
Patient was d/c from [facility] after DCCV and is said he is too sore to return to work on today. Patient is requesting work note. Please advise if work note can be given and duration.

## 2012-09-07 NOTE — Telephone Encounter (Signed)
Patient informed. Letter left upfront. Patient aware.

## 2012-09-07 NOTE — Telephone Encounter (Signed)
OK to hold from work for one week.  Thanks.

## 2012-09-08 ENCOUNTER — Encounter (HOSPITAL_COMMUNITY): Payer: Self-pay | Admitting: Cardiology

## 2012-09-08 ENCOUNTER — Telehealth: Payer: Self-pay | Admitting: Nurse Practitioner

## 2012-09-08 NOTE — Telephone Encounter (Signed)
Sent to [facility] for TCM f/u

## 2012-09-09 ENCOUNTER — Encounter: Payer: Self-pay | Admitting: *Deleted

## 2012-09-12 ENCOUNTER — Other Ambulatory Visit: Payer: Self-pay | Admitting: *Deleted

## 2012-09-12 MED ORDER — DOFETILIDE 250 MCG PO CAPS: 250.0000 ug | ORAL_CAPSULE | Freq: Two times a day (BID) | ORAL | Status: DC

## 2012-09-13 ENCOUNTER — Ambulatory Visit: Payer: Self-pay | Admitting: Pharmacist

## 2012-09-16 ENCOUNTER — Ambulatory Visit (INDEPENDENT_AMBULATORY_CARE_PROVIDER_SITE_OTHER): Payer: BC Managed Care – PPO | Admitting: Physician Assistant

## 2012-09-16 ENCOUNTER — Encounter: Payer: Self-pay | Admitting: Physician Assistant

## 2012-09-16 VITALS — BP 131/95 | HR 60 | Ht 73.0 in | Wt 275.4 lb

## 2012-09-16 DIAGNOSIS — I10 Essential (primary) hypertension: Secondary | ICD-10-CM

## 2012-09-16 DIAGNOSIS — I4891 Unspecified atrial fibrillation: Secondary | ICD-10-CM

## 2012-09-16 DIAGNOSIS — I429 Cardiomyopathy, unspecified: Secondary | ICD-10-CM

## 2012-09-16 NOTE — Patient Instructions (Signed)
   Stop Tikosyn Continue all other current medications. Follow up in  2 weeks

## 2012-09-16 NOTE — Assessment & Plan Note (Signed)
Continue current heart failure regimen with beta blocker and ACE inhibitor therapy. Of note, patient has diuresed 4 pounds since last OV, despite not being on a diuretic.

## 2012-09-16 NOTE — Assessment & Plan Note (Signed)
Stable on current medication regimen 

## 2012-09-16 NOTE — Progress Notes (Signed)
Primary Cardiologist: Rollene Rotunda, MD   HPI: Post hospital followup from [facility] status post elective admission on May 31 for Tikosyn loading and DCCV, per Dr. Jenene Slicker recommendation.  Patient initially placed on Tikosyn 500 mcg q12 hours, subsequently reduced to 250 mcg q12 hours for suspected QTc widening, although difficult to interpret. Following unsuccessful chemical cardioversion after 6 doses, recommendation was to proceed with DCCV, which was successful following 1 application of 120 J.    - 2-D echocardiogram, June 2: EF 35-40%; no focal WMAs; mild MR; mild/moderate LAE; mild RAE    - Post hospital followup, following recent outpatient laboratory data, June 12: BUN 22, creatinine 1.2, potassium 4.4, magnesium 2.0  Clinically, he feels overall less fatigued since his recent hospitalization. He reports marked improvement in his peripheral edema. He is able to lie flat on one pillow, an reports less DOE. He has however, noticed some recurrent tachycardia palpitations, beginning the middle of this week.    Twelve-lead EKG today, reviewed by me, indicates atrial fibrillation 96 bpm; QTC 495 ms  No Known Allergies  Current Outpatient Prescriptions  Medication Sig Dispense Refill  . Albuterol Sulfate (PROVENTIL HFA IN) Inhale into the lungs. Inhale two puffs po q 4 hours prn      . atorvastatin (LIPITOR) 10 MG tablet Take 10 mg by mouth daily.      . Cholecalciferol (VITAMIN D) 2000 UNITS CAPS Take by mouth daily.      . enalapril (VASOTEC) 20 MG tablet Take 20 mg by mouth 2 (two) times daily.      . Fluticasone-Salmeterol (ADVAIR DISKUS) 250-50 MCG/DOSE AEPB Inhale 1 puff into the lungs every 12 (twelve) hours.      . meloxicam (MOBIC) 15 MG tablet Take 15 mg by mouth daily.      . metoprolol tartrate (LOPRESSOR) 25 MG tablet Take 1 tablet (25 mg total) by mouth 2 (two) times daily.  60 tablet  11  . omeprazole (PRILOSEC) 20 MG capsule Take 20 mg by mouth daily.      Marland Kitchen oxybutynin  (DITROPAN) 5 MG tablet Take 5 mg by mouth 2 (two) times daily.      . Rivaroxaban (XARELTO) 20 MG TABS Take 1 tablet (20 mg total) by mouth daily with supper.  30 tablet  3  . tadalafil (CIALIS) 5 MG tablet Take 5 mg by mouth daily as needed for erectile dysfunction.      . Vitamin D, Ergocalciferol, (DRISDOL) 50000 UNITS CAPS Take 50,000 Units by mouth every 7 (seven) days. Takes every Saturday       No current facility-administered medications for this visit.    Past Medical History  Diagnosis Date  . Hypertension   . High cholesterol   . Asthma   . GERD (gastroesophageal reflux disease)   . History of DVT (deep vein thrombosis)     a. left leg  . Wolff-Parkinson-White (WPW) syndrome     a. s/p RFCA 2001  . Atrial fibrillation     a. Dx 08/2012, xarelto initiated; b. 09/2012 Tikosyn initiated -> DCCV -> Sinus.  . Sleep apnea   . Reflux     Past Surgical History  Procedure Laterality Date  . Back surgery    . Tonsillectomy    . Stomach surgery      reflux, fundoplication  . Nose surgery      sleep apnea surgery  . Cardiac surgery    . Tee without cardioversion N/A 08/25/2012    Procedure: TRANSESOPHAGEAL ECHOCARDIOGRAM (  TEE);  Surgeon: Vesta Mixer, MD;  Location: [facility] ENDOSCOPY;  Service: Cardiovascular;  Laterality: N/A;  . Cardioversion N/A 08/25/2012    Procedure: CARDIOVERSION;  Surgeon: Vesta Mixer, MD;  Location: [facility] ENDOSCOPY;  Service: Cardiovascular;  Laterality: N/A;  . Cardioversion N/A 09/06/2012    Procedure: CARDIOVERSION/Bedside;  Surgeon: Luis Abed, MD;  Location: [facility] OR;  Service: Cardiovascular;  Laterality: N/A;    History   Social History  . Marital Status: Married    Spouse Name: N/A    Number of Children: 2  . Years of Education: N/A   Occupational History  .  [employer]   Social History Main Topics  . Smoking status: Former Smoker    Quit date: 08/06/2010  . Smokeless tobacco: Not on file  . Alcohol Use: No  . Drug Use: No  .  Sexually Active: Not on file   Other Topics Concern  . Not on file   Social History Narrative  . No narrative on file    Family History  Problem Relation Age of Onset  . CAD Father 39  . Diabetes Father   . Heart attack Father   . Diabetes Mother   . Diabetes Brother     ROS: no nausea, vomiting; no fever, chills; no melena, hematochezia; no claudication  PHYSICAL EXAM: BP 131/95  Pulse 60  Ht 6\' 1"  (1.854 m)  Wt 275 lb 6.4 oz (124.921 kg)  BMI 36.34 kg/m2  SpO2 97% GENERAL: 47 year old male, morbidly obese; NAD HEENT: NCAT, PERRLA, EOMI; sclera clear; no xanthelasma NECK: palpable bilateral carotid pulses, no bruits; no JVD; no TM LUNGS: CTA bilaterally CARDIAC: Irregularly irregular (S1, S2); no significant murmurs; no rubs or gallops ABDOMEN: soft, protuberant EXTREMETIES: no significant peripheral edema SKIN: warm/dry; no obvious rash/lesions MUSCULOSKELETAL: no joint deformity NEURO: no focal deficit; NL affect   EKG: reviewed and available in Electronic Records   ASSESSMENT & PLAN:  Atrial fibrillation After conferring with Dr. Sherryl Manges, recommendation is to discontinue Tikosyn therapy, given patient's documented failure on this medication. Consideration may be given to an alternative antiarrhythmic (i.e. amiodarone), as well as repeat cardioversion in the near future, if patient remains symptomatic. In the meanwhile, we will continue rate control with the current dose of Lopressor, and reassess his clinical status in 2 weeks with Dr. Antoine Poche. Patient remains on Xarelto anticoagulation. Recent followup labs indicated NL renal function.  Secondary cardiomyopathy, unspecified Continue current heart failure regimen with beta blocker and ACE inhibitor therapy. Of note, patient has diuresed 4 pounds since last OV, despite not being on a diuretic.  Essential hypertension, benign Stable on current medication regimen    Gene Shmuel Girgis, PAC

## 2012-09-16 NOTE — Assessment & Plan Note (Signed)
After conferring with Dr. Sherryl Manges, recommendation is to discontinue Tikosyn therapy, given patient's documented failure on this medication. Consideration may be given to an alternative antiarrhythmic (i.e. amiodarone), as well as repeat cardioversion in the near future, if patient remains symptomatic in this rhythm. In the meanwhile, we will continue rate control with the current dose of Lopressor, and reassess his clinical status in 2 weeks with Dr. Antoine Poche. Patient remains on Xarelto anticoagulation. Recent followup labs indicated NL renal function.

## 2012-09-19 ENCOUNTER — Telehealth: Payer: Self-pay | Admitting: Cardiology

## 2012-09-19 NOTE — Telephone Encounter (Signed)
Will froward to Gene Serpe, PA to review and discuss with SCK and pt

## 2012-09-19 NOTE — Telephone Encounter (Signed)
New Problem  Pt's wife states that his heart is racing and has been racing all weekend.  She said that it has been racing since the doctor took him off of Tikosyn.  She said she is very upset and wants to speak with someone.

## 2012-09-21 ENCOUNTER — Encounter: Payer: Self-pay | Admitting: Physician Assistant

## 2012-09-21 ENCOUNTER — Telehealth: Payer: Self-pay | Admitting: Physician Assistant

## 2012-09-21 NOTE — Telephone Encounter (Signed)
Left message to return call on patient voice mail.

## 2012-09-21 NOTE — Telephone Encounter (Signed)
Please contact pt re: symptomatic palpitations. Pt contacted Avie Arenas, who forwarded message to me. He was seen in office by me 6/13, and taken off Flecainide, per Dr Odessa Fleming recommendation. He failed to maintain NSR on flecainide, s/p recent DCCV.

## 2012-09-23 MED ORDER — METOPROLOL TARTRATE 25 MG PO TABS: 50.0000 mg | ORAL_TABLET | Freq: Two times a day (BID) | ORAL | Status: DC

## 2012-09-23 NOTE — Addendum Note (Signed)
Addended by: Lesle Chris on: 09/23/2012 09:39 AM   Modules accepted: Orders

## 2012-09-23 NOTE — Telephone Encounter (Addendum)
Per previous discussion with Gene Serpe, PA regarding his palpitations.  If patient is still symptomatic, may increase his Metoprolol Tart to 50mg  twice a day & follow up as previously planned for 6/24 with Dr. Antoine Poche.    Patient notified of above.  Will send rx to [facility] at pt request.  Also, advised to monitor blood pressure at home.  Patient verbalized understanding.

## 2012-09-23 NOTE — Telephone Encounter (Signed)
Left message to return call 

## 2012-09-27 ENCOUNTER — Other Ambulatory Visit: Payer: Self-pay | Admitting: *Deleted

## 2012-09-27 ENCOUNTER — Encounter: Payer: Self-pay | Admitting: Cardiology

## 2012-09-27 ENCOUNTER — Ambulatory Visit (INDEPENDENT_AMBULATORY_CARE_PROVIDER_SITE_OTHER): Payer: BC Managed Care – PPO | Admitting: Cardiology

## 2012-09-27 VITALS — BP 153/99 | HR 109 | Ht 73.0 in | Wt 286.0 lb

## 2012-09-27 DIAGNOSIS — I4891 Unspecified atrial fibrillation: Secondary | ICD-10-CM

## 2012-09-27 DIAGNOSIS — Z79899 Other long term (current) drug therapy: Secondary | ICD-10-CM | POA: Diagnosis not present

## 2012-09-27 MED ORDER — AMIODARONE HCL 400 MG PO TABS: 400.0000 mg | ORAL_TABLET | Freq: Two times a day (BID) | ORAL | Status: DC

## 2012-09-27 NOTE — Progress Notes (Signed)
HPI  The patient presents for followup of his atrial fibrillation.  He has had now a cardiomyopathy thought to be related to this. He was  Admitted recently for Tikosyn loading and cardioversion.  Unfortunately he had recurrent atrial fibrillation.  The Tikosyn was stopped at this visit. The patient continues to feel poorly with fatigue. His weight is up a few pounds and he reports swelling though this is not evident on exam. He says his sleeping on 2 pillows. He does have dyspnea with moderate exertion. He denies any chest pressure, neck or arm discomfort. He does feel his heart racing at times.   No Known Allergies  Current Outpatient Prescriptions  Medication Sig Dispense Refill  . Albuterol Sulfate (PROVENTIL HFA IN) Inhale into the lungs. Inhale two puffs po q 4 hours prn      . amLODipine (NORVASC) 5 MG tablet Take 5 mg by mouth daily.       Marland Kitchen atorvastatin (LIPITOR) 10 MG tablet Take 10 mg by mouth daily.      . Cholecalciferol (VITAMIN D) 2000 UNITS CAPS Take by mouth daily.      . enalapril (VASOTEC) 20 MG tablet Take 20 mg by mouth 2 (two) times daily.      . Fluticasone-Salmeterol (ADVAIR DISKUS) 250-50 MCG/DOSE AEPB Inhale 1 puff into the lungs every 12 (twelve) hours.      . meloxicam (MOBIC) 15 MG tablet Take 15 mg by mouth daily.      . metoprolol tartrate (LOPRESSOR) 25 MG tablet Take 2 tablets (50 mg total) by mouth 2 (two) times daily.  120 tablet  6  . omeprazole (PRILOSEC) 20 MG capsule Take 20 mg by mouth daily.      Marland Kitchen oxybutynin (DITROPAN) 5 MG tablet Take 5 mg by mouth 2 (two) times daily.      . Rivaroxaban (XARELTO) 20 MG TABS Take 1 tablet (20 mg total) by mouth daily with supper.  30 tablet  3  . tadalafil (CIALIS) 5 MG tablet Take 5 mg by mouth daily as needed for erectile dysfunction.      . Vitamin D, Ergocalciferol, (DRISDOL) 50000 UNITS CAPS Take 50,000 Units by mouth every 7 (seven) days. Takes every Saturday       No current facility-administered medications  for this visit.    Past Medical History  Diagnosis Date  . Hypertension   . High cholesterol   . Asthma   . GERD (gastroesophageal reflux disease)   . History of DVT (deep vein thrombosis)     a. left leg  . Wolff-Parkinson-White (WPW) syndrome     a. s/p RFCA 2001  . Atrial fibrillation     a. Dx 08/2012, xarelto initiated; b. 09/2012 Tikosyn initiated -> DCCV -> Sinus.  . Sleep apnea   . Reflux     Past Surgical History  Procedure Laterality Date  . Back surgery    . Tonsillectomy    . Stomach surgery      reflux, fundoplication  . Nose surgery      sleep apnea surgery  . Cardiac surgery    . Tee without cardioversion N/A 08/25/2012    Procedure: TRANSESOPHAGEAL ECHOCARDIOGRAM (TEE);  Surgeon: Vesta Mixer, MD;  Location: [facility] ENDOSCOPY;  Service: Cardiovascular;  Laterality: N/A;  . Cardioversion N/A 08/25/2012    Procedure: CARDIOVERSION;  Surgeon: Vesta Mixer, MD;  Location: [facility] ENDOSCOPY;  Service: Cardiovascular;  Laterality: N/A;  . Cardioversion N/A 09/06/2012    Procedure: CARDIOVERSION/Bedside;  Surgeon: Luis Abed, MD;  Location: [facility] OR;  Service: Cardiovascular;  Laterality: N/A;    ROS:  As stated in the HPI and negative for all other systems.   PHYSICAL EXAM BP 153/99  Pulse 109  Ht 6\' 1"  (1.854 m)  Wt 286 lb (129.729 kg)  BMI 37.74 kg/m2 GENERAL:  Well appearing HEENT:  Pupils equal round and reactive, fundi not visualized, oral mucosa unremarkable NECK:  No jugular venous distention, waveform within normal limits, carotid upstroke brisk and symmetric, no bruits, no thyromegaly LYMPHATICS:  No cervical, inguinal adenopathy LUNGS:  Clear to auscultation bilaterally BACK:  No CVA tenderness CHEST:  Unremarkable HEART:  PMI not displaced or sustained,S1 and S2 within normal limits, no S3,no clicks, no rubs, no murmurs, irregular ABD:  Flat, positive bowel sounds normal in frequency in pitch, no bruits, no rebound, no guarding, no midline pulsatile  mass, no hepatomegaly, no splenomegaly, obese EXT:  2 plus pulses throughout, trace bilateral lower extremityedema, no cyanosis no clubbing SKIN:  No rashes no nodules NEURO:  Cranial nerves II through XII grossly intact, motor grossly intact throughout PSYCH:  Cognitively intact, oriented to person place and time   EKG:  Atrial fibrillation, rate 109, premature ectopic complexes, axis within normal limits, RSR prime V1, QTC within normal limits, no acute ST-T wave changes.  ASSESSMENT AND PLAN  ATRIAL FIBRILLATION: the patient needs every effort at sinus rhythm given the cardiomyopathy and symptoms. I will start amiodarone 400 mg twice a day. I reviewed TSH and liver enzymes from May and they were normal. He'll be put in the system for followup of these in one month.  We made an appointment next week for him to see Dr. Johney Frame  HTN: The blood pressure is still at target.  As I am adding amiodarone I won't adjust other medicines but he needs weight loss to bring him to target an he needs to followup a home blood pressure diary.  OBESITY: I will continue to have discussions with him about this with specific instructions for weight loss.   FATIGUE: I suspect that this is in part related to poor sleep habits. I have scheduled him for a followup sleep study but this had to be canceled because of the hospitalization recently. We will reschedule this.  CARDIOMYOPATHY:  This is very likely rate related and is being treated as above.

## 2012-09-27 NOTE — Patient Instructions (Addendum)
You have been referred to Dr. Johney Frame on October 06, 2012 @4 :00 pm at [facility] in [place]. [address]. Your physician has recommended you make the following change in your medication: Start amiodarone 400 mg twice daily. Your new prescription has been sent to your pharmacy. All other medications will remain the same. Your physician recommends that you return for lab work in: 1 month around October 27, 2012 to check LFT's and TSH. Please have this done at [facility]. Please reschedule your sleep study appointment.

## 2012-09-28 ENCOUNTER — Telehealth: Payer: Self-pay | Admitting: Cardiology

## 2012-09-28 ENCOUNTER — Telehealth: Payer: Self-pay | Admitting: Family Medicine

## 2012-09-28 NOTE — Telephone Encounter (Signed)
Patient says that Nurse Practitioner at his work prescribes him oxybutynine 5 mg 2x a day, but she is out of town for vacation and he is out and he would like you to send in an RX for this for this one time.     [facility] in [place]

## 2012-09-28 NOTE — Telephone Encounter (Signed)
Left message of oxybutine 5mg  #60 called into [facility]

## 2012-09-28 NOTE — Telephone Encounter (Signed)
Pt called asking if we could refill his oxybutynin. I told him that this was not a cardiac medication and that our physician's would not refill this medicine. I suggested that he call Dr. Gerda Diss office to see if they would refill it for him.

## 2012-10-06 ENCOUNTER — Ambulatory Visit (INDEPENDENT_AMBULATORY_CARE_PROVIDER_SITE_OTHER): Payer: BC Managed Care – PPO | Admitting: Internal Medicine

## 2012-10-06 ENCOUNTER — Encounter: Payer: Self-pay | Admitting: Internal Medicine

## 2012-10-06 VITALS — BP 148/92 | HR 91 | Ht 73.0 in | Wt 276.4 lb

## 2012-10-06 DIAGNOSIS — I10 Essential (primary) hypertension: Secondary | ICD-10-CM

## 2012-10-06 DIAGNOSIS — G4733 Obstructive sleep apnea (adult) (pediatric): Secondary | ICD-10-CM

## 2012-10-06 DIAGNOSIS — I4891 Unspecified atrial fibrillation: Secondary | ICD-10-CM

## 2012-10-06 NOTE — Progress Notes (Signed)
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  Austin Woods reports initially being diagnosed with atrial fibrillation 5/14 but reports in retrospect that Austin Woods has had afib for 4-5 years.  In May, Austin Woods reports that Austin Woods went to Austin Austin Woods office for sinusitis and was found to have atrial fibrillation with rapid ventricular rates.  Austin Woods was sent to [facility] an subsequently to [facility].  Austin Woods was placed on Xarelto and cardizem initially.  His rates were controlled and Austin Woods was discharged.  Austin Woods was found to have a depressed EF.  Austin Woods returned 3 weeks later for cardioversion.  Austin Woods was cardioversion unsuccessfully.  Austin Woods was subsequently admitted and placed on tikosyn.  Austin Woods was again cardioverted but did not maintain sinus for any significant time.  Upon return to the office, Austin Woods was back in afib.  Austin Woods was then placed on amiodarone 400mg  daily.  Austin Woods is now referred to EP for further evaluation. Austin Woods reports that with afib Austin Woods is "give out" all of the time. Austin Woods works daily but finds that his legs are tired and swollen at the end of the day.  Austin Woods reports palpitations with his afib.  Austin Woods is SOB while working.  + 2 pillow orthopnea.  + snoring and Austin Woods is tired upon waking in the morning. Today, Austin Woods denies symptoms of chest pain,presyncope, syncope, or neurologic sequela. The patient is tolerating medications without difficulties and is otherwise without complaint today.   Past Medical History  Diagnosis Date  . Hypertension   . High cholesterol   . Asthma   . GERD (gastroesophageal reflux disease)   . History of DVT (deep vein thrombosis)     a. left leg following ablation for SVT  . Wolff-Parkinson-White (WPW) syndrome     a. s/p RFCA 2001 by Austin Austin Woods, Austin Woods reports complicated by post procedure DVT  . Persistent atrial fibrillation     a. Dx 08/2012, xarelto  initiated; b. 09/2012 Tikosyn initiated -> DCCV -> Sinus.  . Sleep apnea     does not use CPAP (Austin Woods reports having uvulectomy)    Past Surgical History  Procedure Laterality Date  . Back surgery    . Tonsillectomy    . Stomach surgery      reflux, fundoplication  . Nose surgery      sleep apnea surgery  . Cardiac surgery    . Tee without cardioversion N/A 08/25/2012    Procedure: TRANSESOPHAGEAL ECHOCARDIOGRAM (TEE);  Surgeon: Austin Mixer, MD;  Location: [facility] ENDOSCOPY;  Service: Cardiovascular;  Laterality: N/A;  . Cardioversion N/A 08/25/2012    Procedure: CARDIOVERSION;  Surgeon: Austin Mixer, MD;  Location: [facility] ENDOSCOPY;  Service: Cardiovascular;  Laterality: N/A;  . Cardioversion N/A 09/06/2012    Procedure: CARDIOVERSION/Bedside;  Surgeon: Austin Abed, MD;  Location: [facility] OR;  Service: Cardiovascular;  Laterality: N/A;    Current Outpatient Prescriptions  Medication Sig Dispense Refill  . Albuterol Sulfate (PROVENTIL HFA IN) Inhale into the lungs. Inhale two puffs po q 4 hours prn      . amiodarone (PACERONE) 400 MG tablet Take 1 tablet (400 mg total) by mouth 2 (two) times daily.  60 tablet  3  . amLODipine (NORVASC) 5 MG tablet Take 5 mg by mouth daily.       Marland Kitchen atorvastatin (LIPITOR) 10 MG tablet Take  10 mg by mouth daily.      . Cholecalciferol (VITAMIN D) 2000 UNITS CAPS Take by mouth daily.      . enalapril (VASOTEC) 20 MG tablet Take 20 mg by mouth 2 (two) times daily.      . Fluticasone-Salmeterol (ADVAIR DISKUS) 250-50 MCG/DOSE AEPB Inhale 1 puff into the lungs every 12 (twelve) hours.      . meloxicam (MOBIC) 15 MG tablet Take 15 mg by mouth daily.      . metoprolol tartrate (LOPRESSOR) 25 MG tablet Take 2 tablets (50 mg total) by mouth 2 (two) times daily.  120 tablet  6  . omeprazole (PRILOSEC) 20 MG capsule Take 20 mg by mouth daily.      Marland Kitchen oxybutynin (DITROPAN) 5 MG tablet Take 5 mg by mouth 2 (two) times daily.      . Rivaroxaban (XARELTO) 20 MG TABS Take 1  tablet (20 mg total) by mouth daily with supper.  30 tablet  3  . tadalafil (CIALIS) 5 MG tablet Take 5 mg by mouth daily as needed for erectile dysfunction.      . Vitamin D, Ergocalciferol, (DRISDOL) 50000 UNITS CAPS Take 50,000 Units by mouth every 7 (seven) days. Takes every Saturday       No current facility-administered medications for this visit.    No Known Allergies  History   Social History  . Marital Status: Married    Spouse Name: N/A    Number of Children: 2  . Years of Education: N/A   Occupational History  .  [employer]   Social History Main Topics  . Smoking status: Former Smoker    Quit date: 08/06/2010  . Smokeless tobacco: Not on file  . Alcohol Use: No  . Drug Use: No  . Sexually Active: Not on file   Other Topics Concern  . Not on file   Social History Narrative   Austin Woods lives in [place] with wife.  Works at [employer] History  Problem Relation Age of Onset  . CAD Father 34  . Diabetes Father   . Heart attack Father   . Diabetes Mother   . Diabetes Brother     ROS- All systems are reviewed and negative except as per the HPI above  Physical Exam: Filed Vitals:   10/06/12 1606  BP: 148/92  Pulse: 91  Height: 6\' 1"  (1.854 m)  Weight: 276 lb 6.4 oz (125.374 kg)    GEN- The patient is well appearing, alert and oriented x 3 today.   Head- normocephalic, atraumatic Eyes-  Sclera clear, conjunctiva pink Ears- hearing intact Oropharynx- clear Neck- supple, no JVP Lymph- no cervical lymphadenopathy Lungs- Clear to ausculation bilaterally, normal work of breathing Heart- irregular rate and rhythm, no murmurs, rubs or gallops, PMI not laterally displaced GI- soft, NT, ND, + BS Extremities- no clubbing, cyanosis, or edema MS- no significant deformity or atrophy Skin- no rash or lesion Psych- euthymic mood, full affect Neuro- strength and sensation are intact  EKG today reveals afib with V rate 91 bpm, Qtc 511  Assessment and Plan:  1.  Symptomatic persistent atrial fibrillation The patient has difficult to control persistent afib.  Echo reveals severe LA enlargement with LA size of 50mm. Therapeutic strategies for afib including medicine and ablation were discussed in detail with the patient today. Risk, benefits, and alternatives to EP study and radiofrequency ablation for afib were also discussed in detail today.  I think that presently,  Austin Woods should proceed with cardioversion.  We will try to maintain sinus rhythm with amiodarone and allow his left atrium to potentially remodel.  We can make a decision in the future about whether to consider PVI or a more aggressive hybrid surgical approach for maintenance of sinus rhythm long term.  Continue anticoagulation with xarelto.  2. HTN Stable No change required today  3. OSA Previous uvulectomy May benefit from repeat sleep study.  Austin Woods should follow-up with Austin Andrey Campanile in [place]  Return in 2 months

## 2012-10-06 NOTE — Patient Instructions (Addendum)
Your physician recommends that you schedule a follow-up appointment in: 8 weeks with Dr Johney Frame   Your physician has recommended that you have a Cardioversion (DCCV). Electrical Cardioversion uses a jolt of electricity to your heart either through paddles or wired patches attached to your chest. This is a controlled, usually prescheduled, procedure. Defibrillation is done under light anesthesia in the hospital, and you usually go home the day of the procedure. This is done to get your heart back into a normal rhythm. You are not awake for the procedure. Please see the instruction sheet given to you today.

## 2012-10-12 ENCOUNTER — Telehealth: Payer: Self-pay | Admitting: *Deleted

## 2012-10-12 DIAGNOSIS — G4733 Obstructive sleep apnea (adult) (pediatric): Secondary | ICD-10-CM | POA: Insufficient documentation

## 2012-10-12 NOTE — Telephone Encounter (Signed)
Spoke with pt, he is currently at [facility]. He would like to do the DCCV Friday 10-28-12. Will schedule and call the pt back Monday when he returns.

## 2012-10-12 NOTE — Telephone Encounter (Signed)
Pt seen by dr Johney Frame and needs to be scheduled for a DCCV the week of 10-24-12. Left message for pt to call to schedule

## 2012-10-13 ENCOUNTER — Encounter: Payer: Self-pay | Admitting: *Deleted

## 2012-10-13 NOTE — Telephone Encounter (Signed)
DCCV scheduled 10-28-12 @ 1pm. Instruction letter ready to mail. Will call pt Monday when he returns.

## 2012-10-19 ENCOUNTER — Telehealth: Payer: Self-pay | Admitting: Internal Medicine

## 2012-10-19 NOTE — Telephone Encounter (Signed)
Follow up  Pt would like to speak with you regarding his upcoming procedure.

## 2012-10-19 NOTE — Telephone Encounter (Signed)
It is scheduled for 10/28/12

## 2012-10-19 NOTE — Telephone Encounter (Signed)
Spoke with pt, aware of instructions. Letter mailed to pt home address.

## 2012-10-19 NOTE — Telephone Encounter (Signed)
New Prob     Pt has some questions regarding his upcoming procedure. Please call.

## 2012-10-20 ENCOUNTER — Other Ambulatory Visit: Payer: Self-pay | Admitting: *Deleted

## 2012-10-28 ENCOUNTER — Ambulatory Visit (HOSPITAL_COMMUNITY)
Admission: RE | Admit: 2012-10-28 | Discharge: 2012-10-28 | Disposition: A | Discharge status: Home / Self Care | Payer: BC Managed Care – PPO | Source: Ambulatory Visit | Attending: Cardiology | Admitting: Cardiology

## 2012-10-28 ENCOUNTER — Encounter (HOSPITAL_COMMUNITY): Payer: Self-pay | Admitting: Anesthesiology

## 2012-10-28 ENCOUNTER — Encounter (HOSPITAL_COMMUNITY)
Admission: RE | Disposition: A | Discharge status: Home / Self Care | Payer: Self-pay | Source: Ambulatory Visit | Attending: Cardiology

## 2012-10-28 DIAGNOSIS — I4891 Unspecified atrial fibrillation: Secondary | ICD-10-CM | POA: Insufficient documentation

## 2012-10-28 DIAGNOSIS — Z538 Procedure and treatment not carried out for other reasons: Secondary | ICD-10-CM | POA: Insufficient documentation

## 2012-10-28 SURGERY — CANCELLED PROCEDURE

## 2012-10-28 NOTE — Anesthesia Preprocedure Evaluation (Deleted)
Anesthesia Evaluation  Patient identified by MRN, date of birth, ID band Patient awake    Reviewed: Allergy & Precautions, H&P , NPO status , Patient's Chart, lab work & pertinent test results, reviewed documented beta blocker date and time   Airway Mallampati: II TM Distance: >3 FB Neck ROM: full    Dental   Pulmonary neg pulmonary ROS, asthma , sleep apnea ,  breath sounds clear to auscultation        Cardiovascular hypertension, On Medications and On Home Beta Blockers +CHF + dysrhythmias Atrial Fibrillation Rhythm:regular     Neuro/Psych negative neurological ROS  negative psych ROS   GI/Hepatic Neg liver ROS, GERD-  Medicated and Controlled,  Endo/Other  Morbid obesity  Renal/GU negative Renal ROS  negative genitourinary   Musculoskeletal   Abdominal   Peds  Hematology negative hematology ROS (+)   Anesthesia Other Findings See surgeon's H&P   Reproductive/Obstetrics                           Anesthesia Physical Anesthesia Plan  ASA: III  Anesthesia Plan: General   Post-op Pain Management:    Induction: Intravenous  Airway Management Planned: Mask  Additional Equipment:   Intra-op Plan:   Post-operative Plan:   Informed Consent: I have reviewed the patients History and Physical, chart, labs and discussed the procedure including the risks, benefits and alternatives for the proposed anesthesia with the patient or authorized representative who has indicated his/her understanding and acceptance.   Dental Advisory Given  Plan Discussed with: CRNA and Surgeon  Anesthesia Plan Comments:         Anesthesia Quick Evaluation

## 2012-11-04 ENCOUNTER — Encounter: Payer: Self-pay | Admitting: *Deleted

## 2012-11-04 NOTE — Progress Notes (Signed)
Patient informed and is coming Monday for EKG and medication review. Patient didn't have the DCCV that was scheduled on 10/28/12 because he was in sinus rhythm.

## 2012-11-04 NOTE — Progress Notes (Signed)
Patient was sent to our office today after seeing Dr. Josephina Shih this morning for having a low heart rate of 51 and BP of 153/82. Patient denied chest pain, dizziness or sob. Patient stated he was a little lightheaded all the time and this was something that was chronic. Vitals taken today to confirm numbers compared to ENT office. Nurse confirmed dose of beta blocker. Nurse advised patient that provider would be informed and he would be informed either today or next week with response.

## 2012-11-04 NOTE — Progress Notes (Signed)
Pulse was 91 at recent OV on 10/06/12. Recommend pt come in for EKG and to confirm meds. Was he to be scheduled for DCCV, per Dr Johney Frame?

## 2012-11-07 ENCOUNTER — Encounter: Payer: Self-pay | Admitting: *Deleted

## 2012-11-07 ENCOUNTER — Ambulatory Visit (INDEPENDENT_AMBULATORY_CARE_PROVIDER_SITE_OTHER): Payer: BC Managed Care – PPO | Admitting: *Deleted

## 2012-11-07 VITALS — BP 139/84 | HR 87 | Ht 73.0 in | Wt 267.0 lb

## 2012-11-07 DIAGNOSIS — I4891 Unspecified atrial fibrillation: Secondary | ICD-10-CM

## 2012-11-07 NOTE — Progress Notes (Signed)
Patient presents to office for nurse visit per Gene Serpe. EKG and vitals done. Patient has taken all medications without missing any doses. No c/o of chest pain, dizziness or sob.

## 2012-11-08 ENCOUNTER — Other Ambulatory Visit: Payer: Self-pay | Admitting: *Deleted

## 2012-11-09 NOTE — Progress Notes (Signed)
Patient informed. 

## 2012-11-09 NOTE — Addendum Note (Signed)
Addended by: Prescott Parma C on: 11/09/2012 08:03 AM   Modules accepted: Level of Service

## 2012-11-09 NOTE — Progress Notes (Signed)
EKG reviewed. Patient is in AF/Flutter rhythm with CVR. Recommend f/u with Dr Johney Frame for further recommendations.

## 2012-11-16 ENCOUNTER — Telehealth: Payer: Self-pay | Admitting: Internal Medicine

## 2012-11-16 NOTE — Telephone Encounter (Signed)
New Prob  Pt states that his hr is 49 today.

## 2012-11-16 NOTE — Telephone Encounter (Signed)
Called patient and spoke with him his HR was 49 today.  He is dizzy today.  Never heard back from [facility] office as to what to do in regards to his HR and arrhyhtmia.  I have asked him to hold his Metoprolol tonight and I will discuss with Dr Johney Frame tomorrow

## 2012-11-17 NOTE — Telephone Encounter (Signed)
New problem ° ° °Pt returning a call.  °

## 2012-11-17 NOTE — Telephone Encounter (Signed)
Left patient a message to call back to the office tomorrow and speak to triage in regards to his medications and symptoms.  He is scheduled to follow up with Dr Johney Frame on 12/08/12.  He needs to keep tjis appointment.  As far as the Metoprolol goes need to see if holding the dose helped with his symptoms.  He can always take the 2 in the morning and just one at night.  This could help with his dizziness.  Then if he had fast rates could take an additional 25mg  if needed

## 2012-11-17 NOTE — Telephone Encounter (Signed)
Lmom for patient in regards to his Metoprolol

## 2012-12-07 ENCOUNTER — Other Ambulatory Visit: Payer: Self-pay

## 2012-12-07 MED ORDER — RIVAROXABAN 20 MG PO TABS: 20.0000 mg | ORAL_TABLET | Freq: Every day | ORAL | Status: DC

## 2012-12-08 ENCOUNTER — Ambulatory Visit (INDEPENDENT_AMBULATORY_CARE_PROVIDER_SITE_OTHER): Payer: BC Managed Care – PPO | Admitting: Internal Medicine

## 2012-12-08 ENCOUNTER — Encounter: Payer: Self-pay | Admitting: Internal Medicine

## 2012-12-08 VITALS — BP 150/80 | HR 56 | Ht 73.0 in | Wt 278.0 lb

## 2012-12-08 DIAGNOSIS — G4733 Obstructive sleep apnea (adult) (pediatric): Secondary | ICD-10-CM

## 2012-12-08 DIAGNOSIS — I4891 Unspecified atrial fibrillation: Secondary | ICD-10-CM

## 2012-12-08 DIAGNOSIS — I10 Essential (primary) hypertension: Secondary | ICD-10-CM

## 2012-12-08 MED ORDER — MELOXICAM 15 MG PO TABS: 15.0000 mg | ORAL_TABLET | ORAL | Status: DC | PRN

## 2012-12-08 MED ORDER — HYDROCHLOROTHIAZIDE 25 MG PO TABS: 25.0000 mg | ORAL_TABLET | Freq: Every day | ORAL | Status: DC

## 2012-12-08 MED ORDER — METOPROLOL TARTRATE 25 MG PO TABS: 25.0000 mg | ORAL_TABLET | Freq: Two times a day (BID) | ORAL | Status: DC

## 2012-12-08 MED ORDER — AMIODARONE HCL 200 MG PO TABS: 200.0000 mg | ORAL_TABLET | Freq: Every day | ORAL | Status: DC

## 2012-12-08 NOTE — Patient Instructions (Addendum)
Your physician recommends that you schedule a follow-up appointment in: 6 weeks with Dr Johney Frame  Your physician recommends that you return for lab work today: bmp/liver/tsh/t4  Your physician has recommended you make the following change in your medication:  1) Decrease Amiodarone to 200mg  daily 2) Start HCTZ 25mg  daily 3) Decrease Metoprolol to 25mg  twice daily 4) Take Meloxicam as needed

## 2012-12-08 NOTE — Progress Notes (Signed)
PCP:  Harlow Asa, MD  The patient presents today for routine electrophysiology followup.  Since last being seen in our clinic, the patient reports doing reasonably well. He has spontaneously converted to sinus rhythm.  In sinus, he has been more bradycardic.  This has led to SOB and fatigue.  He also reports headaches.  His blood pressure has been very elevated.  He has been diagnosed with sleep apnea but has not yet started CPAP.  Today, he denies symptoms of palpitations, chest pain, orthopnea, PND, lower extremity edema, dizziness, presyncope, syncope, or neurologic sequela.  The patient feels that he is tolerating medications without difficulties and is otherwise without complaint today.   Past Medical History  Diagnosis Date  . Hypertension   . High cholesterol   . Asthma   . GERD (gastroesophageal reflux disease)   . History of DVT (deep vein thrombosis)     a. left leg following ablation for SVT  . Wolff-Parkinson-White (WPW) syndrome     a. s/p RFCA 2001 by Dr Graciela Husbands, pt reports complicated by post procedure DVT  . Persistent atrial fibrillation     a. Dx 08/2012, xarelto initiated; b. 09/2012 Tikosyn initiated -> DCCV -> Sinus.  . Sleep apnea     does not use CPAP (he reports having uvulectomy)    Past Surgical History  Procedure Laterality Date  . Back surgery    . Tonsillectomy    . Stomach surgery      reflux, fundoplication  . Nose surgery      sleep apnea surgery  . Cardiac surgery    . Tee without cardioversion N/A 08/25/2012    Procedure: TRANSESOPHAGEAL ECHOCARDIOGRAM (TEE);  Surgeon: Vesta Mixer, MD;  Location: [facility] ENDOSCOPY;  Service: Cardiovascular;  Laterality: N/A;  . Cardioversion N/A 08/25/2012    Procedure: CARDIOVERSION;  Surgeon: Vesta Mixer, MD;  Location: [facility] ENDOSCOPY;  Service: Cardiovascular;  Laterality: N/A;  . Cardioversion N/A 09/06/2012    Procedure: CARDIOVERSION/Bedside;  Surgeon: Luis Abed, MD;  Location: [facility] OR;  Service:  Cardiovascular;  Laterality: N/A;    Current Outpatient Prescriptions  Medication Sig Dispense Refill  . Albuterol Sulfate (PROVENTIL HFA IN) Inhale into the lungs. Inhale two puffs po q 4 hours prn      . amiodarone (PACERONE) 200 MG tablet Take 1 tablet (200 mg total) by mouth daily.  90 tablet  3  . atorvastatin (LIPITOR) 10 MG tablet Take 10 mg by mouth daily.      . Cholecalciferol (VITAMIN D) 2000 UNITS CAPS Take by mouth daily.      . enalapril (VASOTEC) 20 MG tablet Take 20 mg by mouth 2 (two) times daily.      . Fluticasone-Salmeterol (ADVAIR DISKUS) 250-50 MCG/DOSE AEPB Inhale 1 puff into the lungs every 12 (twelve) hours.      . meloxicam (MOBIC) 15 MG tablet Take 1 tablet (15 mg total) by mouth as needed for pain.      . metoprolol tartrate (LOPRESSOR) 25 MG tablet Take 1 tablet (25 mg total) by mouth 2 (two) times daily.  180 tablet  3  . omeprazole (PRILOSEC) 20 MG capsule Take 20 mg by mouth daily.      Marland Kitchen oxybutynin (DITROPAN) 5 MG tablet Take 5 mg by mouth 2 (two) times daily.      . Rivaroxaban (XARELTO) 20 MG TABS tablet Take 1 tablet (20 mg total) by mouth daily with supper.  30 tablet  3  . tadalafil (  CIALIS) 5 MG tablet Take 5 mg by mouth daily as needed for erectile dysfunction.      . hydrochlorothiazide (HYDRODIURIL) 25 MG tablet Take 1 tablet (25 mg total) by mouth daily.  90 tablet  3   No current facility-administered medications for this visit.    No Known Allergies  History   Social History  . Marital Status: Married    Spouse Name: N/A    Number of Children: 2  . Years of Education: N/A   Occupational History  .  [employer]   Social History Main Topics  . Smoking status: Former Smoker    Quit date: 08/06/2010  . Smokeless tobacco: Never Used  . Alcohol Use: No  . Drug Use: No  . Sexual Activity: Not on file   Other Topics Concern  . Not on file   Social History Narrative   Pt lives in [place] with wife.  Works at [employer] History    Problem Relation Age of Onset  . CAD Father 42  . Diabetes Father   . Heart attack Father   . Diabetes Mother   . Diabetes Brother     ROS-  All systems are reviewed and are negative except as outlined in the HPI above  Physical Exam: Filed Vitals:   12/08/12 1609  BP: 150/80  Pulse: 56  Height: 6\' 1"  (1.854 m)  Weight: 278 lb (126.1 kg)    GEN- The patient is overweight appearing, alert and oriented x 3 today.   Head- normocephalic, atraumatic Eyes-  Sclera clear, conjunctiva pink Ears- hearing intact Oropharynx- clear Neck- supple, no JVP Lymph- no cervical lymphadenopathy Lungs- Clear to ausculation bilaterally, normal work of breathing Heart- Regular rate and rhythm, no murmurs, rubs or gallops, PMI not laterally displaced GI- soft, NT, ND, + BS Extremities- no clubbing, cyanosis, or edema MS- no significant deformity or atrophy Skin- no rash or lesion Psych- euthymic mood, full affect Neuro- strength and sensation are intact  ekg today reveals sinus rhythm 56 bpm, otherwise normal ekg  Assessment and Plan:  1. Symptomatic persistent atrial fibrillation The patient has difficult to control persistent afib.  Echo reveals severe LA enlargement with LA size of 50mm.  He has converted to sinus rhythm with amiodarone.  Today I will decrease amiodarone to 200mg  daily.  Check LFTs/TFTs We can make a decision in the future about whether to consider PVI or a more aggressive hybrid surgical approach for maintenance of sinus rhythm long term.  Continue anticoagulation with xarelto.  2. HTN Above goal bmet today Add hctz 25mg  daily  3. OSA Previous uvulectomy Sleep study reveals OSA.  He plans to start cpap soon  4. Bradycardia Decrease amiodarone to 200mg  daily Decrease metoprolol to 25mg  BID  Return in 6 weeks

## 2012-12-09 LAB — BASIC METABOLIC PANEL
GFR: 71.67 mL/min (ref >60.00)
Glucose, Bld: 87 mg/dL (ref 70–99)
Potassium: 4.5 mEq/L (ref 3.5–5.1)
Sodium: 139 mEq/L (ref 135–145)

## 2012-12-09 LAB — T4, FREE: Free T4: 1.06 ng/dL (ref 0.60–1.60)

## 2013-01-19 ENCOUNTER — Ambulatory Visit (INDEPENDENT_AMBULATORY_CARE_PROVIDER_SITE_OTHER): Payer: BC Managed Care – PPO | Admitting: Internal Medicine

## 2013-01-19 ENCOUNTER — Encounter: Payer: Self-pay | Admitting: Internal Medicine

## 2013-01-19 VITALS — BP 130/82 | HR 58 | Ht 73.0 in | Wt 283.8 lb

## 2013-01-19 DIAGNOSIS — I10 Essential (primary) hypertension: Secondary | ICD-10-CM

## 2013-01-19 DIAGNOSIS — I4891 Unspecified atrial fibrillation: Secondary | ICD-10-CM

## 2013-01-19 DIAGNOSIS — G4733 Obstructive sleep apnea (adult) (pediatric): Secondary | ICD-10-CM

## 2013-01-19 MED ORDER — AMIODARONE HCL 200 MG PO TABS: 200.0000 mg | ORAL_TABLET | Freq: Every day | ORAL | Status: DC

## 2013-01-19 NOTE — Patient Instructions (Signed)
Your physician recommends that you schedule a follow-up appointment in: 6 weeks with Dr Johney Frame   Follow up with PCP for headaches and dizziness   Your physician has recommended you make the following change in your medication:  1) Decrease Amiodarone to 200mg  daily 2) Stop Metoprolol

## 2013-01-19 NOTE — Progress Notes (Signed)
PCP:  Harlow Asa, MD  The patient presents today for routine electrophysiology followup.  At the visit in September of 2014, his Metoprolol was decreased to 25mg  twice daily due to bradycardia. I instructed him to decrease amiodarone but he did not do so. Since last being seen in our clinic, the patient reports that his dizziness is unchanged.  He also continues to have frequent headaches.  He does not think he has had any further atrial fibrillation.   He has gotten his CPAP and has been wearing it for the last week.  He has noticed an improvement in his sleep but not in his dizziness or energy level.   Today, he denies symptoms of palpitations, chest pain, shortness of breath, orthopnea, PND, lower extremity edema, dizziness, presyncope, syncope, or neurologic sequela.  The patient feels that he is tolerating medications without difficulties and is otherwise without complaint today.   Past Medical History  Diagnosis Date  . Hypertension   . High cholesterol   . Asthma   . GERD (gastroesophageal reflux disease)   . History of DVT (deep vein thrombosis)     a. left leg following ablation for SVT  . Wolff-Parkinson-White (WPW) syndrome     a. s/p RFCA 2001 by Dr Graciela Husbands, pt reports complicated by post procedure DVT  . Persistent atrial fibrillation     a. Dx 08/2012, xarelto initiated; b. 09/2012 Tikosyn initiated -> DCCV -> Sinus.  . Sleep apnea     does not use CPAP (he reports having uvulectomy)    Past Surgical History  Procedure Laterality Date  . Back surgery    . Tonsillectomy    . Stomach surgery      reflux, fundoplication  . Nose surgery      sleep apnea surgery  . Cardiac surgery    . Tee without cardioversion N/A 08/25/2012    Procedure: TRANSESOPHAGEAL ECHOCARDIOGRAM (TEE);  Surgeon: Vesta Mixer, MD;  Location: [facility] ENDOSCOPY;  Service: Cardiovascular;  Laterality: N/A;  . Cardioversion N/A 08/25/2012    Procedure: CARDIOVERSION;  Surgeon: Vesta Mixer, MD;  Location:  [facility] ENDOSCOPY;  Service: Cardiovascular;  Laterality: N/A;  . Cardioversion N/A 09/06/2012    Procedure: CARDIOVERSION/Bedside;  Surgeon: Luis Abed, MD;  Location: [facility] OR;  Service: Cardiovascular;  Laterality: N/A;    Current Outpatient Prescriptions  Medication Sig Dispense Refill  . Albuterol Sulfate (PROVENTIL HFA IN) Inhale into the lungs. Inhale two puffs po q 4 hours prn      . amiodarone (PACERONE) 200 MG tablet Take 200 mg by mouth 2 (two) times daily.      Marland Kitchen atorvastatin (LIPITOR) 10 MG tablet Take 10 mg by mouth daily.      . Cholecalciferol (VITAMIN D) 2000 UNITS CAPS Take by mouth daily.      . enalapril (VASOTEC) 20 MG tablet Take 20 mg by mouth 2 (two) times daily.      . Fluticasone-Salmeterol (ADVAIR DISKUS) 250-50 MCG/DOSE AEPB Inhale 1 puff into the lungs every 12 (twelve) hours.      . hydrochlorothiazide (HYDRODIURIL) 25 MG tablet Take 1 tablet (25 mg total) by mouth daily.  90 tablet  3  . meloxicam (MOBIC) 15 MG tablet Take 1 tablet (15 mg total) by mouth as needed for pain.      Marland Kitchen omeprazole (PRILOSEC) 20 MG capsule Take 20 mg by mouth daily.      Marland Kitchen oxybutynin (DITROPAN) 5 MG tablet Take 5 mg by mouth 2 (two)  times daily.      . Rivaroxaban (XARELTO) 20 MG TABS tablet Take 1 tablet (20 mg total) by mouth daily with supper.  30 tablet  3  . tadalafil (CIALIS) 5 MG tablet Take 5 mg by mouth daily as needed for erectile dysfunction.       No current facility-administered medications for this visit.    No Known Allergies  History   Social History  . Marital Status: Married    Spouse Name: N/A    Number of Children: 2  . Years of Education: N/A   Occupational History  .  [employer]   Social History Main Topics  . Smoking status: Former Smoker    Quit date: 08/06/2010  . Smokeless tobacco: Never Used  . Alcohol Use: No  . Drug Use: No  . Sexual Activity: Not on file   Other Topics Concern  . Not on file   Social History Narrative   Pt lives in [place]  with wife.  Works at [employer] History  Problem Relation Age of Onset  . CAD Father 75  . Diabetes Father   . Heart attack Father   . Diabetes Mother   . Diabetes Brother     ROS-  All systems are reviewed and are negative except as outlined in the HPI above  Physical Exam: Filed Vitals:   01/19/13 1506  BP: 130/82  Pulse: 58  Height: 6\' 1"  (1.854 m)  Weight: 283 lb 12.8 oz (128.731 kg)    GEN- The patient is well appearing, alert and oriented x 3 today.   Head- normocephalic, atraumatic Eyes-  Sclera clear, conjunctiva pink Ears- hearing intact Oropharynx- clear Neck- supple, no JVP Lymph- no cervical lymphadenopathy Lungs- Clear to ausculation bilaterally, normal work of breathing Heart- Regular rate and rhythm, no murmurs, rubs or gallops, PMI not laterally displaced GI- soft, NT, ND, + BS Extremities- no clubbing, cyanosis, or edema MS- no significant deformity or atrophy Skin- no rash or lesion Psych- euthymic mood, full affect Neuro- strength and sensation are intact  ekg today reveals sinus rhythm 58 bpm, PR 184, otherwise normal ekg  Assessment and Plan:  1. afib Improved Decrease amiodarone to 200mg  daily Stop metoprolol  2. HTN Stable No change required today  3. Dizziness/ headaches Unclear etiology I have encouraged him to see Dr Gerda Diss ASAP for further workup  I will see him in 6 weeks.  We could consider afib ablation once his dizziness/ headaches are evaluated.

## 2013-02-09 ENCOUNTER — Other Ambulatory Visit: Payer: Self-pay

## 2013-03-01 ENCOUNTER — Ambulatory Visit (INDEPENDENT_AMBULATORY_CARE_PROVIDER_SITE_OTHER): Payer: BC Managed Care – PPO | Admitting: Internal Medicine

## 2013-03-01 ENCOUNTER — Encounter: Payer: Self-pay | Admitting: Internal Medicine

## 2013-03-01 VITALS — BP 130/80 | HR 81 | Ht 73.0 in | Wt 288.4 lb

## 2013-03-01 DIAGNOSIS — I4891 Unspecified atrial fibrillation: Secondary | ICD-10-CM

## 2013-03-01 DIAGNOSIS — I429 Cardiomyopathy, unspecified: Secondary | ICD-10-CM

## 2013-03-01 DIAGNOSIS — G4733 Obstructive sleep apnea (adult) (pediatric): Secondary | ICD-10-CM

## 2013-03-01 NOTE — Progress Notes (Signed)
PCP:  LUKING,W S, MD  The patient presents today for routine electrophysiology followup.  Since last being seen in our clinic, the patient reports that he is doing reasonably well.  His headaches have improved with treatment of his sinuses.  He reports dizziness and fatigue with amiodarone.  He would like to consider ablation at this time with hopes of getting off of this medicine long term.  He reports compliance with his CPAP.  Today, he denies symptoms of palpitations, chest pain, shortness of breath, orthopnea, PND, lower extremity edema, dizziness, presyncope, syncope, or neurologic sequela.  The patient feels that he is tolerating medications without difficulties and is otherwise without complaint today.   Past Medical History  Diagnosis Date  . Hypertension   . High cholesterol   . Asthma   . GERD (gastroesophageal reflux disease)   . History of DVT (deep vein thrombosis)     a. left leg following ablation for SVT  . Wolff-Parkinson-White (WPW) syndrome     a. s/p RFCA 2001 by Dr Klein, pt reports complicated by post procedure DVT  . Persistent atrial fibrillation     a. Dx 08/2012, xarelto initiated; b. 09/2012 Tikosyn initiated -> DCCV -> Sinus.  . Sleep apnea     does not use CPAP (he reports having uvulectomy)    Past Surgical History  Procedure Laterality Date  . Back surgery    . Tonsillectomy    . Stomach surgery      reflux, fundoplication  . Nose surgery      sleep apnea surgery  . Cardiac surgery    . Tee without cardioversion N/A 08/25/2012    Procedure: TRANSESOPHAGEAL ECHOCARDIOGRAM (TEE);  Surgeon: Philip J Nahser, MD;  Location: [facility] ENDOSCOPY;  Service: Cardiovascular;  Laterality: N/A;  . Cardioversion N/A 08/25/2012    Procedure: CARDIOVERSION;  Surgeon: Philip J Nahser, MD;  Location: [facility] ENDOSCOPY;  Service: Cardiovascular;  Laterality: N/A;  . Cardioversion N/A 09/06/2012    Procedure: CARDIOVERSION/Bedside;  Surgeon: Jeffrey D Katz, MD;  Location: [facility] OR;  Service:  Cardiovascular;  Laterality: N/A;    Current Outpatient Prescriptions  Medication Sig Dispense Refill  . Albuterol Sulfate (PROVENTIL HFA IN) Inhale into the lungs. Inhale two puffs po q 4 hours prn      . amiodarone (PACERONE) 200 MG tablet Take 1 tablet (200 mg total) by mouth daily.  30 tablet  11  . atorvastatin (LIPITOR) 10 MG tablet Take 10 mg by mouth daily.      . azelastine (ASTELIN) 137 MCG/SPRAY nasal spray Place 1 spray into both nostrils daily. Use in each nostril as directed      . Cholecalciferol (VITAMIN D) 2000 UNITS CAPS Take 1 capsule by mouth daily.       . enalapril (VASOTEC) 20 MG tablet Take 20 mg by mouth 2 (two) times daily.      . Fluticasone-Salmeterol (ADVAIR DISKUS) 250-50 MCG/DOSE AEPB Inhale 1 puff into the lungs every 12 (twelve) hours.      . hydrochlorothiazide (HYDRODIURIL) 25 MG tablet Take 1 tablet (25 mg total) by mouth daily.  90 tablet  3  . meloxicam (MOBIC) 15 MG tablet Take 1 tablet (15 mg total) by mouth as needed for pain.      . omeprazole (PRILOSEC) 20 MG capsule Take 20 mg by mouth daily.      . oxybutynin (DITROPAN) 5 MG tablet Take 5 mg by mouth 2 (two) times daily.      . Rivaroxaban (XARELTO)   20 MG TABS tablet Take 1 tablet (20 mg total) by mouth daily with supper.  30 tablet  3  . tadalafil (CIALIS) 5 MG tablet Take 5 mg by mouth daily as needed for erectile dysfunction.       No current facility-administered medications for this visit.    No Known Allergies  History   Social History  . Marital Status: Married    Spouse Name: N/A    Number of Children: 2  . Years of Education: N/A   Occupational History  .  [employer]   Social History Main Topics  . Smoking status: Former Smoker    Quit date: 08/06/2010  . Smokeless tobacco: Never Used  . Alcohol Use: No  . Drug Use: No  . Sexual Activity: Not on file   Other Topics Concern  . Not on file   Social History Narrative   Pt lives in [place] with wife.  Works at [employer]      Family History  Problem Relation Age of Onset  . CAD Father 50  . Diabetes Father   . Heart attack Father   . Diabetes Mother   . Diabetes Brother     ROS-  All systems are reviewed and are negative except as outlined in the HPI above  Physical Exam: Filed Vitals:   03/01/13 1444  BP: 130/80  Pulse: 81  Height: 6' 1" (1.854 m)  Weight: 288 lb 6.4 oz (130.817 kg)    GEN- The patient is well appearing, alert and oriented x 3 today.   Head- normocephalic, atraumatic Eyes-  Sclera clear, conjunctiva pink Ears- hearing intact Oropharynx- clear Neck- supple, no JVP Lymph- no cervical lymphadenopathy Lungs- Clear to ausculation bilaterally, normal work of breathing Heart- Regular rate and rhythm, no murmurs, rubs or gallops, PMI not laterally displaced GI- soft, NT, ND, + BS Extremities- no clubbing, cyanosis, or edema MS- no significant deformity or atrophy Skin- no rash or lesion Psych- euthymic mood, full affect Neuro- strength and sensation are intact  ekg today reveals sinus rhythm 81 bpm, PR 168, otherwise normal ekg  Assessment and Plan:  1. afib Doing reasonably well Therapeutic strategies for afib including medicine and ablation were discussed in detail with the patient today. Risk, benefits, and alternatives to EP study and radiofrequency ablation for afib were also discussed in detail today. These risks include but are not limited to stroke, bleeding, vascular damage, tamponade, perforation, damage to the esophagus, lungs, and other structures, pulmonary vein stenosis, worsening renal function, and death. The patient understands these risk and wishes to proceed.  We will therefore proceed with catheter ablation at the next available time. He is anticoagulated with xarelto.  2. HTN Stable No change required today  3. OSA Compliance with CPAP is encouraged.  

## 2013-03-07 ENCOUNTER — Telehealth: Payer: Self-pay | Admitting: Internal Medicine

## 2013-03-07 NOTE — Telephone Encounter (Signed)
He can pick up a copy of his medication list when he comes for his pre-procedure labs

## 2013-03-07 NOTE — Telephone Encounter (Signed)
New Message  Pt called requests a personal copy of his medications//

## 2013-03-09 ENCOUNTER — Encounter (HOSPITAL_COMMUNITY): Payer: Self-pay | Admitting: Pharmacy Technician

## 2013-03-14 ENCOUNTER — Encounter: Payer: Self-pay | Admitting: *Deleted

## 2013-03-14 ENCOUNTER — Other Ambulatory Visit: Payer: Self-pay | Admitting: *Deleted

## 2013-03-14 DIAGNOSIS — I4891 Unspecified atrial fibrillation: Secondary | ICD-10-CM

## 2013-03-16 ENCOUNTER — Other Ambulatory Visit (INDEPENDENT_AMBULATORY_CARE_PROVIDER_SITE_OTHER): Payer: BC Managed Care – PPO

## 2013-03-16 DIAGNOSIS — I4891 Unspecified atrial fibrillation: Secondary | ICD-10-CM

## 2013-03-16 LAB — BASIC METABOLIC PANEL
BUN: 17 mg/dL (ref 6–23)
CO2: 28 mEq/L (ref 19–32)
Calcium: 9.2 mg/dL (ref 8.4–10.5)
Creatinine, Ser: 1.5 mg/dL (ref 0.4–1.5)
GFR: 53.51 mL/min — ABNORMAL LOW (ref >60.00)
Glucose, Bld: 92 mg/dL (ref 70–99)
Sodium: 141 mEq/L (ref 135–145)

## 2013-03-16 LAB — CBC WITH DIFFERENTIAL/PLATELET
Basophils Absolute: 0 10*3/uL (ref 0.0–0.1)
Eosinophils Absolute: 0.1 10*3/uL (ref 0.0–0.7)
Lymphocytes Relative: 21.5 % (ref 12.0–46.0)
Lymphs Abs: 1.8 10*3/uL (ref 0.7–4.0)
MCHC: 33.3 g/dL (ref 30.0–36.0)
MCV: 95 fl (ref 78.0–100.0)
Monocytes Relative: 8.2 % (ref 3.0–12.0)
Neutro Abs: 5.6 10*3/uL (ref 1.4–7.7)
Neutrophils Relative %: 69 % (ref 43.0–77.0)
Platelets: 247 10*3/uL (ref 150.0–400.0)
RDW: 13.6 % (ref 11.5–14.6)
WBC: 8.2 10*3/uL (ref 4.5–10.5)

## 2013-03-17 ENCOUNTER — Telehealth: Payer: Self-pay | Admitting: Internal Medicine

## 2013-03-17 NOTE — Telephone Encounter (Signed)
New Problem:  Pt is asking for clarification about his arrival time and if there are any other special instructions prior to his procedure.

## 2013-03-17 NOTE — Telephone Encounter (Signed)
Spoke with patient and let him know the reason he needs to be there at 7:30am is due to the TEE

## 2013-03-20 ENCOUNTER — Telehealth: Payer: Self-pay | Admitting: Internal Medicine

## 2013-03-20 NOTE — Telephone Encounter (Signed)
Spoke w/pt's wife.  [employer] has denied pt's ablation for 03-23-13.  Per Dr. Johney Frame, cancel TEE and ablation and schedule pt to see Dr. Johney Frame in 6 weeks.  Read pt's wife reason for denial per [facility]' fax.  Pt's wife requests we call pt tomorrow between 11 a.m. and 3 p.m as he works second shift to discuss denial.

## 2013-03-22 NOTE — Telephone Encounter (Signed)
Spoke with patient and let him know all had been approved and we are still on for tomorrow.  He will be at the hospital at 7:30am

## 2013-03-23 ENCOUNTER — Encounter (HOSPITAL_COMMUNITY)
Admission: RE | Disposition: A | Discharge status: Home / Self Care | Payer: Self-pay | Source: Ambulatory Visit | Attending: Internal Medicine

## 2013-03-23 ENCOUNTER — Encounter (HOSPITAL_COMMUNITY): Payer: BC Managed Care – PPO | Admitting: Certified Registered Nurse Anesthetist

## 2013-03-23 ENCOUNTER — Ambulatory Visit (HOSPITAL_COMMUNITY): Payer: BC Managed Care – PPO | Admitting: Certified Registered Nurse Anesthetist

## 2013-03-23 ENCOUNTER — Ambulatory Visit (HOSPITAL_COMMUNITY)
Admission: RE | Admit: 2013-03-23 | Discharge: 2013-03-24 | Disposition: A | Discharge status: Home / Self Care | Payer: BC Managed Care – PPO | Source: Ambulatory Visit | Attending: Internal Medicine | Admitting: Internal Medicine

## 2013-03-23 ENCOUNTER — Encounter (HOSPITAL_COMMUNITY): Payer: Self-pay

## 2013-03-23 DIAGNOSIS — G4733 Obstructive sleep apnea (adult) (pediatric): Secondary | ICD-10-CM | POA: Diagnosis present

## 2013-03-23 DIAGNOSIS — R5383 Other fatigue: Secondary | ICD-10-CM | POA: Diagnosis present

## 2013-03-23 DIAGNOSIS — Q211 Atrial septal defect: Secondary | ICD-10-CM | POA: Diagnosis not present

## 2013-03-23 DIAGNOSIS — I4891 Unspecified atrial fibrillation: Secondary | ICD-10-CM | POA: Diagnosis present

## 2013-03-23 DIAGNOSIS — Z6837 Body mass index (BMI) 37.0-37.9, adult: Secondary | ICD-10-CM | POA: Diagnosis not present

## 2013-03-23 DIAGNOSIS — Z86718 Personal history of other venous thrombosis and embolism: Secondary | ICD-10-CM | POA: Diagnosis not present

## 2013-03-23 DIAGNOSIS — Z87891 Personal history of nicotine dependence: Secondary | ICD-10-CM | POA: Diagnosis not present

## 2013-03-23 DIAGNOSIS — E7800 Pure hypercholesterolemia, unspecified: Secondary | ICD-10-CM | POA: Insufficient documentation

## 2013-03-23 DIAGNOSIS — I10 Essential (primary) hypertension: Secondary | ICD-10-CM | POA: Diagnosis not present

## 2013-03-23 DIAGNOSIS — Z7901 Long term (current) use of anticoagulants: Secondary | ICD-10-CM | POA: Insufficient documentation

## 2013-03-23 DIAGNOSIS — Q2111 Secundum atrial septal defect: Secondary | ICD-10-CM | POA: Insufficient documentation

## 2013-03-23 HISTORY — PX: ATRIAL FIBRILLATION ABLATION: SHX5732

## 2013-03-23 HISTORY — PX: TEE WITHOUT CARDIOVERSION: SHX5443

## 2013-03-23 HISTORY — PX: ATRIAL FIBRILLATION ABLATION: SHX5456

## 2013-03-23 LAB — MRSA PCR SCREENING: MRSA by PCR: NEGATIVE

## 2013-03-23 LAB — POCT ACTIVATED CLOTTING TIME
Activated Clotting Time: 321 seconds
Activated Clotting Time: 155 seconds

## 2013-03-23 SURGERY — ATRIAL FIBRILLATION ABLATION
Anesthesia: General

## 2013-03-23 SURGERY — ECHOCARDIOGRAM, TRANSESOPHAGEAL
Anesthesia: Moderate Sedation

## 2013-03-23 MED ORDER — AMIODARONE HCL 200 MG PO TABS
200.0000 mg | ORAL_TABLET | Freq: Every day | ORAL | Status: DC
  Administered 2013-03-23 – 2013-03-24 (×2): 200 mg via ORAL
  Filled 2013-03-23 (×2): qty 1

## 2013-03-23 MED ORDER — ONDANSETRON HCL 4 MG/2ML IJ SOLN: 4.0000 mg | Freq: Four times a day (QID) | INTRAMUSCULAR | Status: DC | PRN

## 2013-03-23 MED ORDER — HEPARIN SODIUM (PORCINE) 1000 UNIT/ML IJ SOLN
INTRAMUSCULAR | Status: AC
  Filled 2013-03-23: qty 1

## 2013-03-23 MED ORDER — RIVAROXABAN 20 MG PO TABS
20.0000 mg | ORAL_TABLET | Freq: Every day | ORAL | Status: DC
  Administered 2013-03-23: 20 mg via ORAL
  Filled 2013-03-23 (×2): qty 1

## 2013-03-23 MED ORDER — HEPARIN SODIUM (PORCINE) 1000 UNIT/ML IJ SOLN
INTRAMUSCULAR | Status: DC | PRN
  Administered 2013-03-23: 3 mL via INTRAVENOUS

## 2013-03-23 MED ORDER — ONDANSETRON HCL 4 MG/2ML IJ SOLN
INTRAMUSCULAR | Status: DC | PRN
  Administered 2013-03-23: 4 mg via INTRAVENOUS

## 2013-03-23 MED ORDER — MOMETASONE FURO-FORMOTEROL FUM 100-5 MCG/ACT IN AERO
2.0000 | INHALATION_SPRAY | Freq: Two times a day (BID) | RESPIRATORY_TRACT | Status: DC
  Administered 2013-03-23 – 2013-03-24 (×2): 2 via RESPIRATORY_TRACT
  Filled 2013-03-23: qty 8.8

## 2013-03-23 MED ORDER — ACETAMINOPHEN 325 MG PO TABS: 650.0000 mg | ORAL_TABLET | ORAL | Status: DC | PRN

## 2013-03-23 MED ORDER — LIDOCAINE VISCOUS 2 % MT SOLN
OROMUCOSAL | Status: DC | PRN
  Administered 2013-03-23: 10 mL via OROMUCOSAL

## 2013-03-23 MED ORDER — OXYBUTYNIN CHLORIDE 5 MG PO TABS
5.0000 mg | ORAL_TABLET | Freq: Three times a day (TID) | ORAL | Status: DC | PRN
  Filled 2013-03-23: qty 1

## 2013-03-23 MED ORDER — LIDOCAINE HCL (CARDIAC) 20 MG/ML IV SOLN
INTRAVENOUS | Status: DC | PRN
  Administered 2013-03-23: 100 mg via INTRAVENOUS

## 2013-03-23 MED ORDER — HYDROCODONE-ACETAMINOPHEN 5-325 MG PO TABS
1.0000 | ORAL_TABLET | ORAL | Status: DC | PRN
  Administered 2013-03-23: 1 via ORAL
  Filled 2013-03-23: qty 2

## 2013-03-23 MED ORDER — SODIUM CHLORIDE 0.9 % IV SOLN: INTRAVENOUS | Status: DC

## 2013-03-23 MED ORDER — BUPIVACAINE HCL (PF) 0.25 % IJ SOLN
INTRAMUSCULAR | Status: AC
  Filled 2013-03-23: qty 30

## 2013-03-23 MED ORDER — SODIUM CHLORIDE 0.9 % IV SOLN
INTRAVENOUS | Status: DC
  Administered 2013-03-23 (×3): via INTRAVENOUS

## 2013-03-23 MED ORDER — FENTANYL CITRATE 0.05 MG/ML IJ SOLN
INTRAMUSCULAR | Status: DC | PRN
  Administered 2013-03-23: 50 ug via INTRAVENOUS
  Administered 2013-03-23: 100 ug via INTRAVENOUS

## 2013-03-23 MED ORDER — FENTANYL CITRATE 0.05 MG/ML IJ SOLN
INTRAMUSCULAR | Status: AC
  Filled 2013-03-23: qty 2

## 2013-03-23 MED ORDER — SODIUM CHLORIDE 0.9 % IJ SOLN
3.0000 mL | Freq: Two times a day (BID) | INTRAMUSCULAR | Status: DC
  Administered 2013-03-23: 3 mL via INTRAVENOUS

## 2013-03-23 MED ORDER — LIDOCAINE VISCOUS 2 % MT SOLN
OROMUCOSAL | Status: AC
  Filled 2013-03-23: qty 15

## 2013-03-23 MED ORDER — AZELASTINE HCL 0.1 % NA SOLN
1.0000 | Freq: Two times a day (BID) | NASAL | Status: DC
  Administered 2013-03-23 – 2013-03-24 (×2): 1 via NASAL
  Filled 2013-03-23: qty 30

## 2013-03-23 MED ORDER — PROPOFOL 10 MG/ML IV BOLUS
INTRAVENOUS | Status: DC | PRN
  Administered 2013-03-23: 160 mg via INTRAVENOUS

## 2013-03-23 MED ORDER — SODIUM CHLORIDE 0.9 % IJ SOLN: 3.0000 mL | INTRAMUSCULAR | Status: DC | PRN

## 2013-03-23 MED ORDER — PROTAMINE SULFATE 10 MG/ML IV SOLN
INTRAVENOUS | Status: DC | PRN
  Administered 2013-03-23 (×3): 10 mg via INTRAVENOUS

## 2013-03-23 MED ORDER — MIDAZOLAM HCL 5 MG/ML IJ SOLN
INTRAMUSCULAR | Status: AC
  Filled 2013-03-23: qty 2

## 2013-03-23 MED ORDER — FENTANYL CITRATE 0.05 MG/ML IJ SOLN
INTRAMUSCULAR | Status: DC | PRN
  Administered 2013-03-23: 12.5 ug via INTRAVENOUS
  Administered 2013-03-23 (×2): 25 ug via INTRAVENOUS

## 2013-03-23 MED ORDER — MIDAZOLAM HCL 10 MG/2ML IJ SOLN
INTRAMUSCULAR | Status: DC | PRN
  Administered 2013-03-23: 1 mg via INTRAVENOUS
  Administered 2013-03-23 (×4): 2 mg via INTRAVENOUS

## 2013-03-23 MED ORDER — PANTOPRAZOLE SODIUM 40 MG PO TBEC
40.0000 mg | DELAYED_RELEASE_TABLET | Freq: Every day | ORAL | Status: DC
  Administered 2013-03-23 – 2013-03-24 (×2): 40 mg via ORAL
  Filled 2013-03-23 (×2): qty 1

## 2013-03-23 MED ORDER — MIDAZOLAM HCL 5 MG/5ML IJ SOLN
INTRAMUSCULAR | Status: DC | PRN
  Administered 2013-03-23: 2 mg via INTRAVENOUS

## 2013-03-23 MED ORDER — SODIUM CHLORIDE 0.9 % IV SOLN: 250.0000 mL | INTRAVENOUS | Status: DC | PRN

## 2013-03-23 NOTE — Anesthesia Preprocedure Evaluation (Addendum)
Anesthesia Evaluation  Patient identified by MRN, date of birth, ID band Patient awake    Reviewed: Allergy & Precautions, H&P , NPO status   Airway Mallampati: II TM Distance: >3 FB Neck ROM: Full    Dental  (+) Teeth Intact   Pulmonary asthma , sleep apnea and Continuous Positive Airway Pressure Ventilation , former smoker,  breath sounds clear to auscultation        Cardiovascular hypertension, Pt. on medications + dysrhythmias Atrial Fibrillation     Neuro/Psych    GI/Hepatic Neg liver ROS, GERD-  Medicated and Controlled,  Endo/Other  Morbid obesity  Renal/GU negative Renal ROS     Musculoskeletal   Abdominal   Peds  Hematology   Anesthesia Other Findings   Reproductive/Obstetrics                         Anesthesia Physical Anesthesia Plan  ASA: III  Anesthesia Plan: General   Post-op Pain Management:    Induction: Intravenous  Airway Management Planned: LMA  Additional Equipment:   Intra-op Plan:   Post-operative Plan: Extubation in OR  Informed Consent: I have reviewed the patients History and Physical, chart, labs and discussed the procedure including the risks, benefits and alternatives for the proposed anesthesia with the patient or authorized representative who has indicated his/her understanding and acceptance.   Dental advisory given  Plan Discussed with: CRNA, Anesthesiologist and Surgeon  Anesthesia Plan Comments:         Anesthesia Quick Evaluation

## 2013-03-23 NOTE — Preoperative (Signed)
Beta Blockers   Reason not to administer Beta Blockers:Not Applicable 

## 2013-03-23 NOTE — Transfer of Care (Signed)
Immediate Anesthesia Transfer of Care Note  Patient: Austin Woods  Procedure(s) Performed: Procedure(s): ATRIAL FIBRILLATION ABLATION (N/A)  Patient Location: Cath Lab  Anesthesia Type:General  Level of Consciousness: awake, alert , oriented and patient cooperative  Airway & Oxygen Therapy: Patient Spontanous Breathing and Patient connected to nasal cannula oxygen  Post-op Assessment: Report given to PACU RN, Post -op Vital signs reviewed and stable and Patient moving all extremities X 4  Post vital signs: Reviewed and stable  Complications: No apparent anesthesia complications

## 2013-03-23 NOTE — Progress Notes (Signed)
  Echocardiogram Echocardiogram Transesophageal has been performed.  Austin Woods 03/23/2013, 9:55 AM

## 2013-03-23 NOTE — Anesthesia Procedure Notes (Signed)
Procedure Name: LMA Insertion Date/Time: 03/23/2013 11:03 AM Performed by: Rogelia Boga Pre-anesthesia Checklist: Patient identified, Emergency Drugs available, Suction available, Patient being monitored and Timeout performed Patient Re-evaluated:Patient Re-evaluated prior to inductionOxygen Delivery Method: Circle system utilized Preoxygenation: Pre-oxygenation with 100% oxygen Intubation Type: IV induction LMA: LMA inserted and LMA with gastric port inserted LMA Size: 5.0 Tube type: Oral Number of attempts: 1 Placement Confirmation: positive ETCO2 and breath sounds checked- equal and bilateral Tube secured with: Tape Dental Injury: Teeth and Oropharynx as per pre-operative assessment

## 2013-03-23 NOTE — H&P (View-Only) (Signed)
PCP:  Harlow Asa, MD  The patient presents today for routine electrophysiology followup.  Since last being seen in our clinic, the patient reports that he is doing reasonably well.  His headaches have improved with treatment of his sinuses.  He reports dizziness and fatigue with amiodarone.  He would like to consider ablation at this time with hopes of getting off of this medicine long term.  He reports compliance with his CPAP.  Today, he denies symptoms of palpitations, chest pain, shortness of breath, orthopnea, PND, lower extremity edema, dizziness, presyncope, syncope, or neurologic sequela.  The patient feels that he is tolerating medications without difficulties and is otherwise without complaint today.   Past Medical History  Diagnosis Date  . Hypertension   . High cholesterol   . Asthma   . GERD (gastroesophageal reflux disease)   . History of DVT (deep vein thrombosis)     a. left leg following ablation for SVT  . Wolff-Parkinson-White (WPW) syndrome     a. s/p RFCA 2001 by Dr Graciela Husbands, pt reports complicated by post procedure DVT  . Persistent atrial fibrillation     a. Dx 08/2012, xarelto initiated; b. 09/2012 Tikosyn initiated -> DCCV -> Sinus.  . Sleep apnea     does not use CPAP (he reports having uvulectomy)    Past Surgical History  Procedure Laterality Date  . Back surgery    . Tonsillectomy    . Stomach surgery      reflux, fundoplication  . Nose surgery      sleep apnea surgery  . Cardiac surgery    . Tee without cardioversion N/A 08/25/2012    Procedure: TRANSESOPHAGEAL ECHOCARDIOGRAM (TEE);  Surgeon: Vesta Mixer, MD;  Location: [facility] ENDOSCOPY;  Service: Cardiovascular;  Laterality: N/A;  . Cardioversion N/A 08/25/2012    Procedure: CARDIOVERSION;  Surgeon: Vesta Mixer, MD;  Location: [facility] ENDOSCOPY;  Service: Cardiovascular;  Laterality: N/A;  . Cardioversion N/A 09/06/2012    Procedure: CARDIOVERSION/Bedside;  Surgeon: Luis Abed, MD;  Location: [facility] OR;  Service:  Cardiovascular;  Laterality: N/A;    Current Outpatient Prescriptions  Medication Sig Dispense Refill  . Albuterol Sulfate (PROVENTIL HFA IN) Inhale into the lungs. Inhale two puffs po q 4 hours prn      . amiodarone (PACERONE) 200 MG tablet Take 1 tablet (200 mg total) by mouth daily.  30 tablet  11  . atorvastatin (LIPITOR) 10 MG tablet Take 10 mg by mouth daily.      Marland Kitchen azelastine (ASTELIN) 137 MCG/SPRAY nasal spray Place 1 spray into both nostrils daily. Use in each nostril as directed      . Cholecalciferol (VITAMIN D) 2000 UNITS CAPS Take 1 capsule by mouth daily.       . enalapril (VASOTEC) 20 MG tablet Take 20 mg by mouth 2 (two) times daily.      . Fluticasone-Salmeterol (ADVAIR DISKUS) 250-50 MCG/DOSE AEPB Inhale 1 puff into the lungs every 12 (twelve) hours.      . hydrochlorothiazide (HYDRODIURIL) 25 MG tablet Take 1 tablet (25 mg total) by mouth daily.  90 tablet  3  . meloxicam (MOBIC) 15 MG tablet Take 1 tablet (15 mg total) by mouth as needed for pain.      Marland Kitchen omeprazole (PRILOSEC) 20 MG capsule Take 20 mg by mouth daily.      Marland Kitchen oxybutynin (DITROPAN) 5 MG tablet Take 5 mg by mouth 2 (two) times daily.      . Rivaroxaban (XARELTO)  20 MG TABS tablet Take 1 tablet (20 mg total) by mouth daily with supper.  30 tablet  3  . tadalafil (CIALIS) 5 MG tablet Take 5 mg by mouth daily as needed for erectile dysfunction.       No current facility-administered medications for this visit.    No Known Allergies  History   Social History  . Marital Status: Married    Spouse Name: N/A    Number of Children: 2  . Years of Education: N/A   Occupational History  .  [employer]   Social History Main Topics  . Smoking status: Former Smoker    Quit date: 08/06/2010  . Smokeless tobacco: Never Used  . Alcohol Use: No  . Drug Use: No  . Sexual Activity: Not on file   Other Topics Concern  . Not on file   Social History Narrative   Pt lives in [place] with wife.  Works at [employer] History  Problem Relation Age of Onset  . CAD Father 48  . Diabetes Father   . Heart attack Father   . Diabetes Mother   . Diabetes Brother     ROS-  All systems are reviewed and are negative except as outlined in the HPI above  Physical Exam: Filed Vitals:   03/01/13 1444  BP: 130/80  Pulse: 81  Height: 6\' 1"  (1.854 m)  Weight: 288 lb 6.4 oz (130.817 kg)    GEN- The patient is well appearing, alert and oriented x 3 today.   Head- normocephalic, atraumatic Eyes-  Sclera clear, conjunctiva pink Ears- hearing intact Oropharynx- clear Neck- supple, no JVP Lymph- no cervical lymphadenopathy Lungs- Clear to ausculation bilaterally, normal work of breathing Heart- Regular rate and rhythm, no murmurs, rubs or gallops, PMI not laterally displaced GI- soft, NT, ND, + BS Extremities- no clubbing, cyanosis, or edema MS- no significant deformity or atrophy Skin- no rash or lesion Psych- euthymic mood, full affect Neuro- strength and sensation are intact  ekg today reveals sinus rhythm 81 bpm, PR 168, otherwise normal ekg  Assessment and Plan:  1. afib Doing reasonably well Therapeutic strategies for afib including medicine and ablation were discussed in detail with the patient today. Risk, benefits, and alternatives to EP study and radiofrequency ablation for afib were also discussed in detail today. These risks include but are not limited to stroke, bleeding, vascular damage, tamponade, perforation, damage to the esophagus, lungs, and other structures, pulmonary vein stenosis, worsening renal function, and death. The patient understands these risk and wishes to proceed.  We will therefore proceed with catheter ablation at the next available time. He is anticoagulated with xarelto.  2. HTN Stable No change required today  3. OSA Compliance with CPAP is encouraged.

## 2013-03-23 NOTE — Progress Notes (Signed)
Pt. Has cpap from home. RT waiting for MD order from RN. RT inspected pt. Cpap, no frayed wires or cords, machine appears to be intact. Pt. States he can operate machine himself. RT informed pt. To notify if he needs any assistance.

## 2013-03-23 NOTE — Interval H&P Note (Signed)
History and Physical Interval Note:  03/23/2013 10:35 AM  Austin Woods  has presented today for surgery, with the diagnosis of AFib  The various methods of treatment have been discussed with the patient and family. After consideration of risks, benefits and other options for treatment, the patient has consented to  Procedure(s): ATRIAL FIBRILLATION ABLATION (N/A) as a surgical intervention .  The patient's history has been reviewed, patient examined, no change in status, stable for surgery.  I have reviewed the patient's chart and labs.  Questions were answered to the patient's satisfaction.     Hillis Range

## 2013-03-23 NOTE — Interval H&P Note (Signed)
History and Physical Interval Note:  03/23/2013 10:36 AM  Austin Woods  has presented today for surgery, with the diagnosis of AFib  The various methods of treatment have been discussed with the patient and family. After consideration of risks, benefits and other options for treatment, the patient has consented to  Procedure(s): ATRIAL FIBRILLATION ABLATION (N/A) as a surgical intervention .  The patient's history has been reviewed, patient examined, no change in status, stable for surgery.  I have reviewed the patient's chart and labs.  Questions were answered to the patient's satisfaction.     Hillis Range

## 2013-03-23 NOTE — Anesthesia Postprocedure Evaluation (Signed)
  Anesthesia Post-op Note  Patient: Austin Woods  Procedure(s) Performed: Procedure(s): ATRIAL FIBRILLATION ABLATION (N/A)  Patient Location: PACU  Anesthesia Type:General  Level of Consciousness: awake  Airway and Oxygen Therapy: Patient Spontanous Breathing  Post-op Pain: mild  Post-op Assessment: Post-op Vital signs reviewed  Post-op Vital Signs: Reviewed  Complications: No apparent anesthesia complications

## 2013-03-23 NOTE — Op Note (Signed)
SURGEON:  Austin Woods  PREPROCEDURE DIAGNOSES: 1. Paroxysmal atrial fibrillation.  POSTPROCEDURE DIAGNOSES: 1. Paroxysmal  atrial fibrillation.  PROCEDURES: 1. Comprehensive electrophysiologic study. 2. Coronary sinus pacing and recording. 3. Three-dimensional mapping of atrial fibrillation   4. Ablation of atrial fibrillation   5. Intracardiac echocardiography. 6. Transseptal puncture of an intact septum. 7. Arrhythmia induction with pacing with isuprel infusion  INTRODUCTION:  Austin Woods is a 47 y.o. male with a history of paroxysmal atrial fibrillation who now presents for EP study and radiofrequency ablation.  The patient reports initially being diagnosed with atrial fibrillation after presenting with symptomatic palpitations and fatgiue. The patient reports increasing frequency and duration of atrial fibrillation since that time.  The patient has failed medical therapy with flecainide, tikosyn, and amiodarone.  The patient therefore presents today for catheter ablation of atrial fibrillation.  DESCRIPTION OF PROCEDURE:  Informed written consent was obtained, and the patient was brought to the electrophysiology lab in a fasting state.  The patient was adequately sedated with intravenous medications as outlined in the anesthesia report.  The patient's left and right groins were prepped and draped in the usual sterile fashion by the EP lab staff.  Using a percutaneous Seldinger technique, two 7-French and one 11-French hemostasis sheaths were placed into the right common femoral vein.  3D rotational angiography not performed due to increase creatinin (1.5)e.  Catheter Placement:  A 7-French Biosense Webster Decapolar coronary sinus catheter was introduced through the right common femoral vein and advanced into the coronary sinus for recording and pacing from this location.  A 6-French quadripolar Josephson catheter was introduced through the right common femoral vein and advanced  into the right ventricle for recording and pacing.  This catheter was then pulled back to the His bundle location.    Initial Measurements: The patient presented to the electrophysiology lab in sinus rhythm.  His PR interval measured 179 msec with a QRS duration of 120 msec and a QT interval of 476 msec.  The AH interval measured 84 msec and the HV interval measured 47 msec.     Intracardiac Echocardiography: A 10-French Biosense Webster AcuNav intracardiac echocardiography catheter was introduced through the left common femoral vein and advanced into the right atrium. Intracardiac echocardiography was performed of the left atrium, and a three-dimensional anatomical rendering of the left atrium was performed using CARTO sound technology.  The patient was noted to have a moderate sized left atrium.  The interatrial septum was prominent but not aneurysmal. All 4 pulmonary veins were visualized and noted to have separate ostia.  The pulmonary veins were moderate in size.  The left atrial appendage was visualized and did not reveal thrombus.   There was no evidence of pulmonary vein stenosis.   Transseptal Puncture: The middle right common femoral vein sheath was exchanged for an 8.5 [place] SL2 transseptal sheath and transseptal access was achieved in a standard fashion using a Brockenbrough needle under biplane fluoroscopy with intracardiac echocardiography confirmation of the transseptal puncture.  Once transseptal access had been achieved, heparin was administered intravenously and intra- arterially in order to maintain an ACT of greater than 300 seconds throughout the procedure.   3D Mapping and Ablation: The His bundle catheter was removed and in its place a 3.5 mm [employer] Thermocool ablation catheter was advanced into the right atrium.  The transseptal sheath was pulled back into the IVC over a guidewire.  The ablation catheter was advanced across the transseptal hole using the  wire as a guide.  The transseptal sheath was then re-advanced over the guidewire into the left atrium.  A duodecapolar Biosense Webster circular mapping catheter was introduced through the transseptal sheath and positioned over the mouth of all 4 pulmonary veins.  Three-dimensional electroanatomical mapping was performed using CARTO technology.  This demonstrated electrical activity within all four pulmonary veins at baseline. The patient underwent successful sequential electrical isolation and anatomical encircling of all four pulmonary veins using radiofrequency current with a circular mapping catheter as a guide.  His PVs were very challenging to isolate today.  This was partially due to poor visualization due to the patients large body habitus but also due to thick/ tough pulmanary veins.  Extensive RF was applied using a WACA approach.    Measurements Following Ablation: In sinus rhythm with RR interval was 893 msec, with PR 191 msec, QRS 128 msec, and Qtc 466 msec.  Following ablation the AH interval measured 81 msec with an HV interval of 56 msec. Ventricular pacing was performed, which revealed midline decremental VA conduction with a VA Wenckebach cycle length of less than 560 msec.  Rapid atrial pacing was performed, which revealed an AV Wenckebach cycle length of 400 msec.    The procedure was therefore considered completed.  All catheters were removed, and the sheaths were aspirated and flushed.  The patient was transferred to the recovery area for sheath removal per protocol.  A limited bedside transthoracic echocardiogram revealed no pericardial effusion.  There were no early apparent complications.  CONCLUSIONS: 1. Sinus rhythm upon presentation.   2. Intracardiac echo reveals a moderate sized left atrium with four separate pulmonary veins without evidence of pulmonary vein stenosis. 3. Successful electrical isolation and anatomical encircling of all four pulmonary veins with  radiofrequency current. 4. No inducible arrhythmias following ablation both on and off of Isuprel 5. No early apparent complications.   Austin Woods 1:38 PM 03/23/2013

## 2013-03-23 NOTE — Discharge Summary (Signed)
ELECTROPHYSIOLOGY PROCEDURE DISCHARGE SUMMARY    Patient ID: Austin Woods,  MRN: [id number], DOB/AGE: 10-31-1965 47 y.o.  Admit date: 03/23/2013 Discharge date: 03/24/2013  Primary Care Physician: Harlow Asa, MD Primary Cardiologist: Rollene Rotunda, MD Electrophysiologist: Hillis Range, MD  Primary Discharge Diagnosis:  Persistent atrial fibrillation status post ablation this admission  Secondary Discharge Diagnosis:  1.  Hypertension 2.  Hyperlipidemia 3.  GERD 4.  WPW - s/p ablation 2001 5.  Sleep apnea 6.  GERD  Procedures This Admission:  1.  Electrophysiology study and radiofrequency catheter ablation on 03-23-2013 by Dr Hillis Range.  This study demonstrated sinus rhythm upon presentation; intracardiac echo reveals a moderate sized left atrium with four separate pulmonary veins without evidence of pulmonary vein stenosis; successful electrical isolation and anatomical encircling of all four pulmonary veins with radiofrequency current; no inducible arrhythmias following ablation both on and off of Isuprel.  There were no early apparent complications.   Brief HPI: Austin Woods is a 47 y.o. male with a history of persistent atrial fibrillation.  They have failed medical therapy with Flecainide, Tikosyn, and Amiodarone. Risks, benefits, and alternatives to catheter ablation of atrial fibrillation were reviewed with the patient who wished to proceed.  The patient underwent TEE prior to the procedure which demonstrated normal LV function and no LAA thrombus.    Hospital Course:  The patient was admitted and underwent EPS/RFCA of atrial fibrillation with details as outlined above.  They were monitored on telemetry overnight which demonstrated sinus rhythm.  Groin was without complication on the day of discharge.  The patient was examined by Dr Johney Frame and considered to be stable for discharge.  Wound care and restrictions were reviewed with the patient.  The patient will be  seen back by Dr Johney Frame in 12 weeks for post ablation follow up.   Physical Exam: Filed Vitals:   03/24/13 0200 03/24/13 0400 03/24/13 0413 03/24/13 0600  BP: 144/69 126/68  147/74  Pulse: 63 56  66  Temp:   97.7 F (36.5 C)   TempSrc:   Oral   Resp:      Height:      Weight:      SpO2: 98% 98%  99%    GEN- The patient is well appearing, alert and oriented x 3 today.   Head- normocephalic, atraumatic Eyes-  Sclera clear, conjunctiva pink Ears- hearing intact Oropharynx- clear Neck- supple, no JVP Lymph- no cervical lymphadenopathy Lungs- Clear to ausculation bilaterally, normal work of breathing Heart- Regular rate and rhythm, no murmurs, rubs or gallops, PMI not laterally displaced GI- soft, NT, ND, + BS Extremities- no clubbing, cyanosis, or edema, no hematoma/ bruit MS- no significant deformity or atrophy Skin- no rash or lesion Psych- euthymic mood, full affect Neuro- strength and sensation are intact   Labs:   Lab Results  Component Value Date   WBC 8.2 03/16/2013   HGB 13.8 03/16/2013   HCT 41.3 03/16/2013   MCV 95.0 03/16/2013   PLT 247.0 03/16/2013   No results found for this basename: NA, K, CL, CO2, BUN, CREATININE, CALCIUM, LABALBU, PROT, BILITOT, ALKPHOS, ALT, AST, GLUCOSE,  in the last 168 hours    Discharge Medications:    Medication List         ADVAIR DISKUS 250-50 MCG/DOSE Aepb  Generic drug:  Fluticasone-Salmeterol  Inhale 1 puff into the lungs every 12 (twelve) hours.     amiodarone 200 MG tablet  Commonly known as:  PACERONE  Take 1 tablet (200 mg total) by mouth daily.     atorvastatin 10 MG tablet  Commonly known as:  LIPITOR  Take 10 mg by mouth daily.     azelastine 137 MCG/SPRAY nasal spray  Commonly known as:  ASTELIN  Place 1 spray into both nostrils 2 (two) times daily.     enalapril 20 MG tablet  Commonly known as:  VASOTEC  Take 20 mg by mouth daily.     EYE DROPS OP  Place 1 drop into both eyes daily as needed  (dryness).     hydrochlorothiazide 25 MG tablet  Commonly known as:  HYDRODIURIL  Take 1 tablet (25 mg total) by mouth daily.     meloxicam 15 MG tablet  Commonly known as:  MOBIC  Take 1 tablet (15 mg total) by mouth as needed for pain.     omeprazole 20 MG capsule  Commonly known as:  PRILOSEC  Take 20 mg by mouth daily.     oxybutynin 5 MG tablet  Commonly known as:  DITROPAN  Take 5 mg by mouth every 8 (eight) hours as needed for bladder spasms.     PROVENTIL HFA IN  Inhale 2 puffs into the lungs every 4 (four) hours as needed (wheezing).     Rivaroxaban 20 MG Tabs tablet  Commonly known as:  XARELTO  Take 1 tablet (20 mg total) by mouth daily with supper.     tadalafil 5 MG tablet  Commonly known as:  CIALIS  Take 5 mg by mouth daily as needed for erectile dysfunction.     Vitamin D 2000 UNITS Caps  Take 1 capsule by mouth daily.        Duration of Discharge Encounter: Greater than 30 minutes including physician time.  Signed,  Hillis Range MD

## 2013-03-23 NOTE — Op Note (Signed)
LA appendage without thrombus PFO present as tested with injection of agitated saline Full reprot to follow

## 2013-03-23 NOTE — Interval H&P Note (Signed)
History and Physical Interval Note:  03/23/2013 9:13 AM  Austin Woods  has presented today for surgery, with the diagnosis of afib  The various methods of treatment have been discussed with the patient and family. After consideration of risks, benefits and other options for treatment, the patient has consented to  Procedure(s): TRANSESOPHAGEAL ECHOCARDIOGRAM (TEE) (N/A) as a surgical intervention .  The patient's history has been reviewed, patient examined, no change in status, stable for surgery.  I have reviewed the patient's chart and labs.  Questions were answered to the patient's satisfaction.     [name]

## 2013-03-24 ENCOUNTER — Encounter (HOSPITAL_COMMUNITY): Payer: Self-pay | Admitting: Internal Medicine

## 2013-03-24 DIAGNOSIS — I10 Essential (primary) hypertension: Secondary | ICD-10-CM

## 2013-03-24 DIAGNOSIS — I4891 Unspecified atrial fibrillation: Secondary | ICD-10-CM

## 2013-03-24 LAB — BASIC METABOLIC PANEL
BUN: 14 mg/dL (ref 6–23)
CO2: 26 mEq/L (ref 19–32)
Calcium: 8.1 mg/dL — ABNORMAL LOW (ref 8.4–10.5)
Creatinine, Ser: 1.23 mg/dL (ref 0.50–1.35)
Glucose, Bld: 131 mg/dL — ABNORMAL HIGH (ref 70–99)
Potassium: 4.2 mEq/L (ref 3.5–5.1)

## 2013-03-27 ENCOUNTER — Encounter: Payer: Self-pay | Admitting: Family Medicine

## 2013-03-27 ENCOUNTER — Ambulatory Visit (INDEPENDENT_AMBULATORY_CARE_PROVIDER_SITE_OTHER): Payer: BC Managed Care – PPO | Admitting: Family Medicine

## 2013-03-27 VITALS — BP 120/82 | Temp 98.3°F | Ht 73.0 in | Wt 292.0 lb

## 2013-03-27 DIAGNOSIS — J019 Acute sinusitis, unspecified: Secondary | ICD-10-CM

## 2013-03-27 MED ORDER — CEFPROZIL 500 MG PO TABS: 500.0000 mg | ORAL_TABLET | Freq: Two times a day (BID) | ORAL | Status: DC

## 2013-03-27 NOTE — Progress Notes (Signed)
   Subjective:    Patient ID: Austin Woods, male    DOB: [birth year], 47 y.o.   MRN: [id number]  Sinusitis This is a new problem. The problem is unchanged. There has been no fever. The pain is moderate. Associated symptoms include congestion, coughing, ear pain and sinus pressure. Past treatments include nothing. The treatment provided no relief.   PMH benign heart disease recent cardiac procedure no side effects currently   Review of Systems  Constitutional: Negative for fever and activity change.  HENT: Positive for congestion, ear pain, rhinorrhea and sinus pressure.   Eyes: Negative for discharge.  Respiratory: Positive for cough. Negative for wheezing.   Cardiovascular: Negative for chest pain.       Objective:   Physical Exam  Nursing note and vitals reviewed. Constitutional: He appears well-developed.  HENT:  Head: Normocephalic.  Mouth/Throat: Oropharynx is clear and moist. No oropharyngeal exudate.  Neck: Normal range of motion.  Cardiovascular: Normal rate, regular rhythm and normal heart sounds.   No murmur heard. Pulmonary/Chest: Effort normal and breath sounds normal. He has no wheezes.  Lymphadenopathy:    He has no cervical adenopathy.  Neurological: He exhibits normal muscle tone.  Skin: Skin is warm and dry.          Assessment & Plan:  Sinusitis-Cefzil 500 one bid 10 days

## 2013-04-07 ENCOUNTER — Telehealth: Payer: Self-pay | Admitting: Family Medicine

## 2013-04-07 MED ORDER — BENZONATATE 100 MG PO CAPS: 100.0000 mg | ORAL_CAPSULE | Freq: Three times a day (TID) | ORAL | Status: DC | PRN

## 2013-04-07 NOTE — Telephone Encounter (Signed)
Tessalon 100mg  tid prn, #30, if ongoing to follow up nurse to review for SOB fever etc as discussed with [place] Miners

## 2013-04-07 NOTE — Telephone Encounter (Signed)
Patient was seen a couple of weeks ago for sinusitis and would like something called in for his cough. Preferably not a liquid. He would like a pill if possible.     Austin Woods

## 2013-04-07 NOTE — Telephone Encounter (Signed)
Patient notified

## 2013-04-09 ENCOUNTER — Other Ambulatory Visit: Payer: Self-pay | Admitting: Internal Medicine

## 2013-05-03 ENCOUNTER — Ambulatory Visit (INDEPENDENT_AMBULATORY_CARE_PROVIDER_SITE_OTHER): Payer: BC Managed Care – PPO | Admitting: Cardiovascular Disease

## 2013-05-03 VITALS — BP 143/93 | HR 64 | Ht 73.0 in | Wt 274.0 lb

## 2013-05-03 DIAGNOSIS — I4891 Unspecified atrial fibrillation: Secondary | ICD-10-CM

## 2013-05-03 DIAGNOSIS — Z9889 Other specified postprocedural states: Secondary | ICD-10-CM

## 2013-05-03 DIAGNOSIS — I10 Essential (primary) hypertension: Secondary | ICD-10-CM

## 2013-05-03 DIAGNOSIS — E785 Hyperlipidemia, unspecified: Secondary | ICD-10-CM

## 2013-05-03 DIAGNOSIS — Z8679 Personal history of other diseases of the circulatory system: Secondary | ICD-10-CM

## 2013-05-03 DIAGNOSIS — I429 Cardiomyopathy, unspecified: Secondary | ICD-10-CM

## 2013-05-03 NOTE — Patient Instructions (Signed)
Continue all current medications. Your physician wants you to follow up in: 6 months.  You will receive a reminder letter in the mail one-two months in advance.  If you don't receive a letter, please call our office to schedule the follow up appointment   

## 2013-05-03 NOTE — Progress Notes (Signed)
Patient ID: Austin Woods, male   DOB: October 22, 1965, 48 y.o.   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 He failed multiple antiarrhythmics which included flecainide and dofetilide. He ultimately underwent radiofrequency ablation for atrial fibrillation in December 2014 by Dr. Rayann Heman. He is maintained on amiodarone and Xarelto. He also has a h/o WPW ablated in 2001, along with HTN and hyperlipidemia. TEE on 03/23/2013 revealed vigorous LV systolic function. TTE on 09/05/2012 revealed EF 35-40%. Since his ablation, he has seldom experienced palpitations and this primarily occurs when he feels fatigued. He has chronic dyspnea with significant exertion but this has not progressed in severity. His ECG today shows normal sinus rhythm at a rate of 64 beats per minute.    No Known Allergies  Current Outpatient Prescriptions  Medication Sig Dispense Refill  . Albuterol Sulfate (PROVENTIL HFA IN) Inhale 2 puffs into the lungs every 4 (four) hours as needed (wheezing).       Marland Kitchen amiodarone (PACERONE) 200 MG tablet Take 1 tablet (200 mg total) by mouth daily.  30 tablet  11  . atorvastatin (LIPITOR) 10 MG tablet Take 10 mg by mouth daily.      Marland Kitchen azelastine (ASTELIN) 137 MCG/SPRAY nasal spray Place 1 spray into both nostrils 2 (two) times daily.       . Cholecalciferol (VITAMIN D) 2000 UNITS CAPS Take 1 capsule by mouth daily.       . enalapril (VASOTEC) 20 MG tablet Take 20 mg by mouth daily.       . Fluticasone-Salmeterol (ADVAIR DISKUS) 250-50 MCG/DOSE AEPB Inhale 1 puff into the lungs every 12 (twelve) hours.      . hydrochlorothiazide (HYDRODIURIL) 25 MG tablet Take 1 tablet (25 mg total) by mouth daily.  90 tablet  3  . HYDROcodone-acetaminophen (NORCO/VICODIN) 5-325 MG per tablet Take 1-2 tablets by mouth every 6 (six) hours as needed.       Marland Kitchen  ibuprofen (ADVIL,MOTRIN) 600 MG tablet Take 600 mg by mouth every 8 (eight) hours as needed.       . meloxicam (MOBIC) 15 MG tablet Take 1 tablet (15 mg total) by mouth as needed for pain.      Marland Kitchen omeprazole (PRILOSEC) 20 MG capsule Take 20 mg by mouth daily.      . penicillin v potassium (VEETID) 500 MG tablet Take 500 mg by mouth 4 (four) times daily.       . Rivaroxaban (XARELTO) 20 MG TABS tablet Take 1 tablet (20 mg total) by mouth daily with supper.  30 tablet  6  . tadalafil (CIALIS) 5 MG tablet Take 5 mg by mouth daily as needed for erectile dysfunction.      . Tetrahydrozoline HCl (EYE DROPS OP) Place 1 drop into both eyes daily as needed (dryness).       No current facility-administered medications for this visit.    Past Medical History  Diagnosis Date  . Hypertension   . High cholesterol   . Asthma   . GERD (gastroesophageal reflux disease)   . History of DVT (deep vein thrombosis)     a. left leg following ablation for SVT  . Wolff-Parkinson-White (WPW) syndrome     a. s/p RFCA 2001 by Dr Caryl Comes, pt reports complicated by post procedure DVT  . Persistent atrial fibrillation     a. Dx 08/2012, xarelto  initiated; b. 09/2012 Tikosyn initiated -> DCCV -> Sinus.  . Sleep apnea     does not use CPAP (he reports having uvulectomy)     Past Surgical History  Procedure Laterality Date  . Back surgery    . Tonsillectomy    . Stomach surgery      reflux, fundoplication  . Nose surgery      sleep apnea surgery  . Cardiac surgery    . Tee without cardioversion N/A 08/25/2012    Procedure: TRANSESOPHAGEAL ECHOCARDIOGRAM (TEE);  Surgeon: Thayer Headings, MD;  Location: [place];  Service: Cardiovascular;  Laterality: N/A;  . Cardioversion N/A 08/25/2012    Procedure: CARDIOVERSION;  Surgeon: Thayer Headings, MD;  Location: [facility] ENDOSCOPY;  Service: Cardiovascular;  Laterality: N/A;  . Cardioversion N/A 09/06/2012    Procedure: CARDIOVERSION/Bedside;  Surgeon: Carlena Bjornstad, MD;   Location: [place];  Service: Cardiovascular;  Laterality: N/A;  . Tee without cardioversion  03/23/2013    DR ROSS  . Ablation of dysrhythmic focus  03/23/2013    DR ALLRED  . Tee without cardioversion N/A 03/23/2013    Procedure: TRANSESOPHAGEAL ECHOCARDIOGRAM (TEE);  Surgeon: Fay Records, MD;  Location: [facility] ENDOSCOPY;  Service: Cardiovascular;  Laterality: N/A;    History   Social History  . Marital Status: Married    Spouse Name: N/A    Number of Children: 2  . Years of Education: N/A   Occupational History  .  [employer]   Social History Main Topics  . Smoking status: Former Smoker    Quit date: 08/06/2010  . Smokeless tobacco: Never Used  . Alcohol Use: No  . Drug Use: No  . Sexual Activity: Not on file   Other Topics Concern  . Not on file   Social History Narrative   Pt lives in [place] with wife.  Works at [employer]:   05/03/13 1010  BP: 143/93  Pulse: 64  Height: 6\' 1"  (1.854 m)  Weight: 274 lb (124.286 kg)    PHYSICAL EXAM General: NAD Neck: No JVD, no thyromegaly or thyroid nodule.  Lungs: Clear to auscultation bilaterally with normal respiratory effort. CV: Nondisplaced PMI.  Heart regular S1/S2, no S3/S4, no murmur.  No peripheral edema.  No carotid bruit.  Normal pedal pulses.  Abdomen: Soft, nontender, no hepatosplenomegaly, no distention.  Neurologic: Alert and oriented x 3.  Psych: Normal affect. Extremities: No clubbing or cyanosis.   ECG: reviewed and available in electronic records.      ASSESSMENT AND PLAN: 1. Atrial fibrillation s/p ablation: maintains normal sinus rhythm. Continue amiodarone and Xarelto. Follows up with Dr. Rayann Heman within next 2-3 months. 2. Cardiomyopathy: his HR is 64 bpm and thus I am not inclined to start a pure beta blocker. Will reassess with TTE in 6 months. 3. HTN: slightly elevated today, but he had it recently checked at work and it was 127/70 mmHg. Will continue to monitor. 4. Hyperlipidemia:  continue Lipitor 10 mg daily.  Dispo: f/u 6 months.   Kate Sable, M.D., F.A.C.C.

## 2013-06-26 ENCOUNTER — Encounter: Payer: Self-pay | Admitting: Internal Medicine

## 2013-06-26 ENCOUNTER — Ambulatory Visit (INDEPENDENT_AMBULATORY_CARE_PROVIDER_SITE_OTHER): Payer: BC Managed Care – PPO | Admitting: Internal Medicine

## 2013-06-26 VITALS — BP 145/92 | HR 68 | Ht 73.0 in | Wt 281.0 lb

## 2013-06-26 DIAGNOSIS — I4891 Unspecified atrial fibrillation: Secondary | ICD-10-CM

## 2013-06-26 MED ORDER — AMIODARONE HCL 200 MG PO TABS: 100.0000 mg | ORAL_TABLET | Freq: Every day | ORAL | Status: DC

## 2013-06-26 NOTE — Patient Instructions (Signed)
Your physician recommends that you schedule a follow-up appointment in: 3 months with Dr Rayann Heman  Your physician has recommended you make the following change in your medication:  1) Decrease Amiodarone to 100mg  daily

## 2013-06-26 NOTE — Progress Notes (Signed)
PCP:  [name], MD Primary Cardiologist:  Bronson Ing  The patient presents today for routine electrophysiology followup.  Since his afib ablation, the patient reports that he is doing reasonably well.   He is unaware of any further afib.  He reports compliance with his CPAP.  Today, he denies symptoms of palpitations, chest pain, shortness of breath, orthopnea, PND, lower extremity edema, dizziness, presyncope, syncope, or neurologic sequela.  The patient feels that he is tolerating medications without difficulties and is otherwise without complaint today.   Past Medical History  Diagnosis Date  . Hypertension   . High cholesterol   . Asthma   . GERD (gastroesophageal reflux disease)   . History of DVT (deep vein thrombosis)     a. left leg following ablation for SVT  . Wolff-Parkinson-White (WPW) syndrome     a. s/p RFCA 2001 by Dr Caryl Comes, pt reports complicated by post procedure DVT  . Persistent atrial fibrillation     a. Dx 08/2012, xarelto initiated; b. 09/2012 Tikosyn initiated -> DCCV -> Sinus.  . Sleep apnea     does not use CPAP (he reports having uvulectomy)    Past Surgical History  Procedure Laterality Date  . Back surgery    . Tonsillectomy    . Stomach surgery      reflux, fundoplication  . Nose surgery      sleep apnea surgery  . Cardiac surgery    . Tee without cardioversion N/A 08/25/2012    Procedure: TRANSESOPHAGEAL ECHOCARDIOGRAM (TEE);  Surgeon: Thayer Headings, MD;  Location: [place];  Service: Cardiovascular;  Laterality: N/A;  . Cardioversion N/A 08/25/2012    Procedure: CARDIOVERSION;  Surgeon: Thayer Headings, MD;  Location: [facility] ENDOSCOPY;  Service: Cardiovascular;  Laterality: N/A;  . Cardioversion N/A 09/06/2012    Procedure: CARDIOVERSION/Bedside;  Surgeon: Carlena Bjornstad, MD;  Location: [place];  Service: Cardiovascular;  Laterality: N/A;  . Tee without cardioversion  03/23/2013    DR ROSS  . Atrial fibrillation ablation  03/23/2013    PVI by DR Rayann Heman  for afib  . Tee without cardioversion N/A 03/23/2013    Procedure: TRANSESOPHAGEAL ECHOCARDIOGRAM (TEE);  Surgeon: Fay Records, MD;  Location: [facility] ENDOSCOPY;  Service: Cardiovascular;  Laterality: N/A;    Current Outpatient Prescriptions  Medication Sig Dispense Refill  . Albuterol Sulfate (PROVENTIL HFA IN) Inhale 2 puffs into the lungs every 4 (four) hours as needed (wheezing).       Marland Kitchen amiodarone (PACERONE) 200 MG tablet Take 1 tablet (200 mg total) by mouth daily.  30 tablet  11  . atorvastatin (LIPITOR) 10 MG tablet Take 10 mg by mouth daily.      Marland Kitchen azelastine (ASTELIN) 137 MCG/SPRAY nasal spray Place 1 spray into both nostrils 2 (two) times daily.       . Cholecalciferol (VITAMIN D) 2000 UNITS CAPS Take 1 capsule by mouth daily.       . enalapril (VASOTEC) 20 MG tablet Take 20 mg by mouth daily.       . Fluticasone-Salmeterol (ADVAIR DISKUS) 250-50 MCG/DOSE AEPB Inhale 1 puff into the lungs every 12 (twelve) hours.      . hydrochlorothiazide (HYDRODIURIL) 25 MG tablet Take 1 tablet (25 mg total) by mouth daily.  90 tablet  3  . meloxicam (MOBIC) 15 MG tablet Take 15 mg by mouth daily.      . mometasone-formoterol (DULERA) 100-5 MCG/ACT AERO Inhale 2 puffs into the lungs 2 (two) times daily.      Marland Kitchen  omeprazole (PRILOSEC) 20 MG capsule Take 20 mg by mouth daily.      Marland Kitchen oxybutynin (DITROPAN) 5 MG tablet Take 1 tablet by mouth 2 (two) times daily.       . Rivaroxaban (XARELTO) 20 MG TABS tablet Take 1 tablet (20 mg total) by mouth daily with supper.  30 tablet  6  . tadalafil (CIALIS) 5 MG tablet Take 5 mg by mouth daily as needed for erectile dysfunction.       No current facility-administered medications for this visit.    No Known Allergies  History   Social History  . Marital Status: Married    Spouse Name: N/A    Number of Children: 2  . Years of Education: N/A   Occupational History  .  [employer]   Social History Main Topics  . Smoking status: Former Smoker    Quit date:  08/06/2010  . Smokeless tobacco: Never Used  . Alcohol Use: No  . Drug Use: No  . Sexual Activity: Not on file   Other Topics Concern  . Not on file   Social History Narrative   Pt lives in [place] with wife.  Works at [employer] History  Problem Relation Age of Onset  . CAD Father 52  . Diabetes Father   . Heart attack Father   . Diabetes Mother   . Diabetes Brother     ROS-  All systems are reviewed and are negative except as outlined in the HPI above  Physical Exam: Filed Vitals:   06/26/13 1225  BP: 145/92  Pulse: 68  Height: 6\' 1"  (1.854 m)  Weight: 281 lb (127.461 kg)    GEN- The patient is well appearing, alert and oriented x 3 today.   Head- normocephalic, atraumatic Eyes-  Sclera clear, conjunctiva pink Ears- hearing intact Oropharynx- clear Neck- supple, no JVP Lymph- no cervical lymphadenopathy Lungs- Clear to ausculation bilaterally, normal work of breathing Heart- Regular rate and rhythm, no murmurs, rubs or gallops, PMI not laterally displaced GI- soft, NT, ND, + BS Extremities- no clubbing, cyanosis, or edema MS- no significant deformity or atrophy Skin- no rash or lesion Psych- euthymic mood, full affect Neuro- strength and sensation are intact  ekg today reveals sinus rhythm 68 bpm, PR 170, RsR', otherwise normal ekg  Assessment and Plan:  1. afib Doing well s/p ablation Decrease amiodarone to 100mg  daily Continue xarelto Consider stopping amiodarone upon return  2. HTN Stable No change required today  3. OSA Compliance with CPAP is encouraged.  Return to se me in 3 months

## 2013-09-29 ENCOUNTER — Ambulatory Visit: Payer: Self-pay | Admitting: Internal Medicine

## 2013-10-27 ENCOUNTER — Ambulatory Visit: Payer: Self-pay | Admitting: Internal Medicine

## 2013-11-09 ENCOUNTER — Other Ambulatory Visit: Payer: Self-pay | Admitting: Internal Medicine

## 2013-11-17 ENCOUNTER — Other Ambulatory Visit: Payer: Self-pay

## 2013-11-17 MED ORDER — ENALAPRIL MALEATE 20 MG PO TABS: 20.0000 mg | ORAL_TABLET | Freq: Every day | ORAL | Status: DC

## 2013-12-03 ENCOUNTER — Other Ambulatory Visit: Payer: Self-pay | Admitting: Internal Medicine

## 2014-01-01 ENCOUNTER — Encounter: Payer: Self-pay | Admitting: *Deleted

## 2014-01-01 ENCOUNTER — Ambulatory Visit (INDEPENDENT_AMBULATORY_CARE_PROVIDER_SITE_OTHER): Payer: BC Managed Care – PPO | Admitting: Internal Medicine

## 2014-01-01 ENCOUNTER — Encounter: Payer: Self-pay | Admitting: Internal Medicine

## 2014-01-01 VITALS — BP 136/88 | HR 111 | Ht 73.0 in | Wt 286.6 lb

## 2014-01-01 DIAGNOSIS — I10 Essential (primary) hypertension: Secondary | ICD-10-CM

## 2014-01-01 DIAGNOSIS — Z01812 Encounter for preprocedural laboratory examination: Secondary | ICD-10-CM

## 2014-01-01 DIAGNOSIS — I480 Paroxysmal atrial fibrillation: Secondary | ICD-10-CM

## 2014-01-01 DIAGNOSIS — I4891 Unspecified atrial fibrillation: Secondary | ICD-10-CM

## 2014-01-01 DIAGNOSIS — I429 Cardiomyopathy, unspecified: Secondary | ICD-10-CM

## 2014-01-01 DIAGNOSIS — G4733 Obstructive sleep apnea (adult) (pediatric): Secondary | ICD-10-CM

## 2014-01-01 MED ORDER — AMIODARONE HCL 200 MG PO TABS: 200.0000 mg | ORAL_TABLET | Freq: Every day | ORAL | Status: DC

## 2014-01-01 MED ORDER — HYDROCHLOROTHIAZIDE 25 MG PO TABS: 12.5000 mg | ORAL_TABLET | Freq: Every day | ORAL | Status: DC

## 2014-01-01 NOTE — Patient Instructions (Signed)
Your physician has recommended you make the following change in your medication:  1.) INCREASE AMIODARONE TO 200 MG DAILY 2.) DECREASE HYDROCHLOROTHIAZIDE TO 12.5 MG (1/2 TABLET) DAILY  Your physician has recommended that you have a Cardioversion (DCCV). Electrical Cardioversion uses a jolt of electricity to your heart either through paddles or wired patches attached to your chest. This is a controlled, usually prescheduled, procedure. Defibrillation is done under light anesthesia in the hospital, and you usually go home the day of the procedure. This is done to get your heart back into a normal rhythm. You are not awake for the procedure. Please see the instruction sheet given to you today.  Your physician recommends that you schedule a follow-up appointment in: [place], NP

## 2014-01-01 NOTE — Progress Notes (Signed)
PCP:  [name], MD Primary Cardiologist:  Bronson Ing  The patient presents today for routine electrophysiology followup.   He says that due to worsening sinus symptoms, he has not been compliant with CPAP for the past 2 months.  Over the past month, he has had worsening fatigue.  He has postural dizziness.  Today, he denies symptoms of palpitations, chest pain, shortness of breath, orthopnea, PND, lower extremity edema,presyncope, syncope, or neurologic sequela.  The patient feels that he is tolerating medications without difficulties and is otherwise without complaint today.   Past Medical History  Diagnosis Date  . Hypertension   . High cholesterol   . Asthma   . GERD (gastroesophageal reflux disease)   . History of DVT (deep vein thrombosis)     a. left leg following ablation for SVT  . Wolff-Parkinson-White (WPW) syndrome     a. s/p RFCA 2001 by Dr Caryl Comes, pt reports complicated by post procedure DVT  . Persistent atrial fibrillation     a. Dx 08/2012, xarelto initiated; b. 09/2012 Tikosyn initiated -> DCCV -> Sinus.  . Sleep apnea     does not use CPAP (he reports having uvulectomy)    Past Surgical History  Procedure Laterality Date  . Back surgery    . Tonsillectomy    . Stomach surgery      reflux, fundoplication  . Nose surgery      sleep apnea surgery  . Cardiac surgery    . Tee without cardioversion N/A 08/25/2012    Procedure: TRANSESOPHAGEAL ECHOCARDIOGRAM (TEE);  Surgeon: Thayer Headings, MD;  Location: [place];  Service: Cardiovascular;  Laterality: N/A;  . Cardioversion N/A 08/25/2012    Procedure: CARDIOVERSION;  Surgeon: Thayer Headings, MD;  Location: [facility] ENDOSCOPY;  Service: Cardiovascular;  Laterality: N/A;  . Cardioversion N/A 09/06/2012    Procedure: CARDIOVERSION/Bedside;  Surgeon: Carlena Bjornstad, MD;  Location: [place];  Service: Cardiovascular;  Laterality: N/A;  . Tee without cardioversion  03/23/2013    DR ROSS  . Atrial fibrillation ablation  03/23/2013     PVI by DR Rayann Heman for afib  . Tee without cardioversion N/A 03/23/2013    Procedure: TRANSESOPHAGEAL ECHOCARDIOGRAM (TEE);  Surgeon: Fay Records, MD;  Location: [facility] ENDOSCOPY;  Service: Cardiovascular;  Laterality: N/A;    Current Outpatient Prescriptions  Medication Sig Dispense Refill  . Albuterol Sulfate (PROVENTIL HFA IN) Inhale 2 puffs into the lungs every 4 (four) hours as needed (wheezing).       Marland Kitchen amiodarone (PACERONE) 200 MG tablet Take 0.5 tablets (100 mg total) by mouth daily.  30 tablet  11  . atorvastatin (LIPITOR) 10 MG tablet Take 10 mg by mouth daily.      Marland Kitchen azelastine (ASTELIN) 137 MCG/SPRAY nasal spray Place 1 spray into both nostrils 2 (two) times daily.       . Cholecalciferol (VITAMIN D) 2000 UNITS CAPS Take 1 capsule by mouth daily.       . enalapril (VASOTEC) 20 MG tablet Take 1 tablet (20 mg total) by mouth daily.  30 tablet  1  . Fluticasone-Salmeterol (ADVAIR DISKUS) 250-50 MCG/DOSE AEPB Inhale 1 puff into the lungs every 12 (twelve) hours.      . hydrochlorothiazide (HYDRODIURIL) 25 MG tablet TAKE ONE TABLET BY MOUTH ONCE DAILY  90 tablet  3  . meloxicam (MOBIC) 15 MG tablet Take 15 mg by mouth daily as needed for pain.       . mometasone-formoterol (DULERA) 100-5 MCG/ACT AERO Inhale  2 puffs into the lungs 2 (two) times daily.      Marland Kitchen omeprazole (PRILOSEC) 20 MG capsule Take 20 mg by mouth daily.      Marland Kitchen oxybutynin (DITROPAN) 5 MG tablet Take 1 tablet by mouth 2 (two) times daily as needed.       . tadalafil (CIALIS) 5 MG tablet Take 5 mg by mouth daily as needed for erectile dysfunction.      Alveda Reasons 20 MG TABS tablet TAKE ONE TABLET BY MOUTH ONCE DAILY WITH SUPPER  30 tablet  1   No current facility-administered medications for this visit.   ROS- he has headaches chronically, continues to gain weight, sinus pressure, itchy eyes, all systems are reviewed and negative except as per HPI above  No Known Allergies  History   Social History  . Marital Status:  Married    Spouse Name: N/A    Number of Children: 2  . Years of Education: N/A   Occupational History  .  [employer]   Social History Main Topics  . Smoking status: Former Smoker    Quit date: 08/06/2010  . Smokeless tobacco: Never Used  . Alcohol Use: No  . Drug Use: No  . Sexual Activity: Not on file   Other Topics Concern  . Not on file   Social History Narrative   Pt lives in [place] with wife.  Works at [employer] History  Problem Relation Age of Onset  . CAD Father 82  . Diabetes Father   . Heart attack Father   . Diabetes Mother   . Diabetes Brother      Physical Exam: Filed Vitals:   01/01/14 0944  BP: 136/88  Pulse: 111  Height: 6\' 1"  (1.854 m)  Weight: 286 lb 9.6 oz (130.001 kg)    GEN- The patient is well appearing, alert and oriented x 3 today.   Head- normocephalic, atraumatic Eyes-  Sclera clear, conjunctiva pink Ears- hearing intact Oropharynx- clear Neck- supple, no JVP Lymph- no cervical lymphadenopathy Lungs- Clear to ausculation bilaterally, normal work of breathing Heart- irregular rate and rhythm, no murmurs, rubs or gallops, PMI not laterally displaced GI- soft, NT, ND, + BS Extremities- no clubbing, cyanosis, or edema MS- no significant deformity or atrophy Skin- no rash or lesion Psych- euthymic mood, full affect Neuro- strength and sensation are intact  ekg today reveals afib at 110 bpm Epic records including echo from 2014 are reviewed today  Assessment and Plan:  1. afib Unfortunately, he has gained weight is not complaint with CPAP.  It appears that for several weeks now he is back in afib I will increase amio back to 200mg  daily today Continue xarelto Proceed with cardioversion in 2 weeks.  If clinically improved then we should consider aggressive efforts to maintain sinus rhythm.  If he does not feel better then rate control may be reasonable long term.  2. HTN Stable No change required today Decrease hctz to  12.5mg  daily due to postural dizziness  3. OSA Compliance with CPAP is encouraged. I have recommended that he follow-up with his ENT for treatment of sinus symptoms  4. Obesity Body mass index is 37.82 kg/(m^2). Weight loss is strongly encouraged  Return to see Roderic Palau in the AF clinic in 6 weeks

## 2014-01-01 NOTE — Addendum Note (Signed)
Addended by: Rodman Key on: 01/01/2014 12:04 PM   Modules accepted: Orders

## 2014-01-05 ENCOUNTER — Other Ambulatory Visit (INDEPENDENT_AMBULATORY_CARE_PROVIDER_SITE_OTHER): Payer: BC Managed Care – PPO | Admitting: *Deleted

## 2014-01-05 DIAGNOSIS — Z01812 Encounter for preprocedural laboratory examination: Secondary | ICD-10-CM

## 2014-01-05 LAB — CBC WITH DIFFERENTIAL/PLATELET
BASOS PCT: 0.4 % (ref 0.0–3.0)
Basophils Absolute: 0 10*3/uL (ref 0.0–0.1)
EOS ABS: 0.1 10*3/uL (ref 0.0–0.7)
EOS PCT: 1.2 % (ref 0.0–5.0)
HEMATOCRIT: 44.7 % (ref 39.0–52.0)
HEMOGLOBIN: 14.9 g/dL (ref 13.0–17.0)
LYMPHS ABS: 2.1 10*3/uL (ref 0.7–4.0)
Lymphocytes Relative: 26.7 % (ref 12.0–46.0)
MCHC: 33.3 g/dL (ref 30.0–36.0)
MCV: 95.4 fl (ref 78.0–100.0)
MONO ABS: 0.5 10*3/uL (ref 0.1–1.0)
Monocytes Relative: 6.9 % (ref 3.0–12.0)
NEUTROS ABS: 5 10*3/uL (ref 1.4–7.7)
Neutrophils Relative %: 64.8 % (ref 43.0–77.0)
Platelets: 250 10*3/uL (ref 150.0–400.0)
RBC: 4.69 Mil/uL (ref 4.22–5.81)
RDW: 13.3 % (ref 11.5–15.5)
WBC: 7.7 10*3/uL (ref 4.0–10.5)

## 2014-01-05 LAB — PROTIME-INR
INR: 2.1 ratio — ABNORMAL HIGH (ref 0.8–1.0)
Prothrombin Time: 22.6 s — ABNORMAL HIGH (ref 9.6–13.1)

## 2014-01-05 LAB — BASIC METABOLIC PANEL
BUN: 19 mg/dL (ref 6–23)
CHLORIDE: 105 meq/L (ref 96–112)
CO2: 26 meq/L (ref 19–32)
Calcium: 9.2 mg/dL (ref 8.4–10.5)
Creatinine, Ser: 1.4 mg/dL (ref 0.4–1.5)
GFR: 56.87 mL/min — ABNORMAL LOW (ref >60.00)
Glucose, Bld: 100 mg/dL — ABNORMAL HIGH (ref 70–99)
POTASSIUM: 4.1 meq/L (ref 3.5–5.1)
SODIUM: 141 meq/L (ref 135–145)

## 2014-01-08 ENCOUNTER — Other Ambulatory Visit: Payer: Self-pay

## 2014-01-08 ENCOUNTER — Other Ambulatory Visit: Payer: Self-pay | Admitting: Internal Medicine

## 2014-01-08 MED ORDER — HYDROCHLOROTHIAZIDE 25 MG PO TABS: 12.5000 mg | ORAL_TABLET | Freq: Every day | ORAL | Status: DC

## 2014-01-08 MED ORDER — RIVAROXABAN 20 MG PO TABS: ORAL_TABLET | ORAL | Status: DC

## 2014-01-12 ENCOUNTER — Ambulatory Visit (HOSPITAL_COMMUNITY): Payer: BC Managed Care – PPO | Admitting: Anesthesiology

## 2014-01-12 ENCOUNTER — Encounter (HOSPITAL_COMMUNITY): Payer: Self-pay | Admitting: *Deleted

## 2014-01-12 ENCOUNTER — Encounter (HOSPITAL_COMMUNITY): Payer: BC Managed Care – PPO | Admitting: Anesthesiology

## 2014-01-12 ENCOUNTER — Encounter (HOSPITAL_COMMUNITY)
Admission: RE | Disposition: A | Discharge status: Home / Self Care | Payer: Self-pay | Source: Ambulatory Visit | Attending: Internal Medicine

## 2014-01-12 ENCOUNTER — Ambulatory Visit (HOSPITAL_COMMUNITY)
Admission: RE | Admit: 2014-01-12 | Discharge: 2014-01-12 | Disposition: A | Discharge status: Home / Self Care | Payer: BC Managed Care – PPO | Source: Ambulatory Visit | Attending: Internal Medicine | Admitting: Internal Medicine

## 2014-01-12 DIAGNOSIS — I4891 Unspecified atrial fibrillation: Secondary | ICD-10-CM

## 2014-01-12 DIAGNOSIS — Z87891 Personal history of nicotine dependence: Secondary | ICD-10-CM | POA: Diagnosis not present

## 2014-01-12 DIAGNOSIS — I456 Pre-excitation syndrome: Secondary | ICD-10-CM

## 2014-01-12 DIAGNOSIS — K219 Gastro-esophageal reflux disease without esophagitis: Secondary | ICD-10-CM | POA: Insufficient documentation

## 2014-01-12 DIAGNOSIS — J45909 Unspecified asthma, uncomplicated: Secondary | ICD-10-CM | POA: Insufficient documentation

## 2014-01-12 DIAGNOSIS — I10 Essential (primary) hypertension: Secondary | ICD-10-CM | POA: Insufficient documentation

## 2014-01-12 DIAGNOSIS — G4730 Sleep apnea, unspecified: Secondary | ICD-10-CM | POA: Insufficient documentation

## 2014-01-12 DIAGNOSIS — I429 Cardiomyopathy, unspecified: Secondary | ICD-10-CM

## 2014-01-12 HISTORY — PX: CARDIOVERSION: SHX1299

## 2014-01-12 SURGERY — CARDIOVERSION
Anesthesia: Monitor Anesthesia Care

## 2014-01-12 MED ORDER — PROPOFOL 10 MG/ML IV BOLUS
INTRAVENOUS | Status: DC | PRN
  Administered 2014-01-12: 200 mg via INTRAVENOUS

## 2014-01-12 MED ORDER — SODIUM CHLORIDE 0.9 % IV SOLN
INTRAVENOUS | Status: DC
  Administered 2014-01-12: 500 mL via INTRAVENOUS

## 2014-01-12 MED ORDER — LIDOCAINE HCL (CARDIAC) 20 MG/ML IV SOLN
INTRAVENOUS | Status: DC | PRN
  Administered 2014-01-12: 70 mg via INTRAVENOUS

## 2014-01-12 NOTE — H&P (Signed)
     INTERVAL PROCEDURE H&P  History and Physical Interval Note:  01/12/2014 7:53 AM  Austin Woods has presented today for their planned procedure. The various methods of treatment have been discussed with the patient and family. After consideration of risks, benefits and other options for treatment, the patient has consented to the procedure.  The patients' outpatient history has been reviewed, patient examined, and no change in status from most recent office note within the past 30 days. I have reviewed the patients' chart and labs and will proceed as planned. Questions were answered to the patient's satisfaction.   Austin Casino, MD, [facility] Attending Cardiologist [facility]  Austin Woods 01/12/2014, 7:53 AM

## 2014-01-12 NOTE — Transfer of Care (Signed)
Immediate Anesthesia Transfer of Care Note  Patient: Austin Woods  Procedure(s) Performed: Procedure(s): CARDIOVERSION (N/A)  Patient Location: Endoscopy Unit  Anesthesia Type:MAC  Level of Consciousness: awake, alert  and oriented  Airway & Oxygen Therapy: Patient Spontanous Breathing and Patient connected to nasal cannula oxygen  Post-op Assessment: Report given to PACU RN and Post -op Vital signs reviewed and stable  Post vital signs: Reviewed and stable  Complications: No apparent anesthesia complications

## 2014-01-12 NOTE — Anesthesia Postprocedure Evaluation (Signed)
  Anesthesia Post-op Note  Patient: Austin Woods  Procedure(s) Performed: Procedure(s): CARDIOVERSION (N/A)  Patient Location: Endoscopy Unit  Anesthesia Type:MAC  Level of Consciousness: awake, alert  and oriented  Airway and Oxygen Therapy: Patient Spontanous Breathing and Patient connected to nasal cannula oxygen  Post-op Pain: none  Post-op Assessment: Post-op Vital signs reviewed, Patient's Cardiovascular Status Stable, Respiratory Function Stable, Patent Airway and No signs of Nausea or vomiting  Post-op Vital Signs: Reviewed and stable  Last Vitals:  Filed Vitals:   01/12/14 0913  BP: 135/95  Pulse: 87  Temp:   Resp: 26    Complications: No apparent anesthesia complications

## 2014-01-12 NOTE — Anesthesia Preprocedure Evaluation (Signed)
Anesthesia Evaluation  Patient identified by MRN, date of birth, ID band Patient awake    Reviewed: Allergy & Precautions, H&P , NPO status , Patient's Chart, lab work & pertinent test results  Airway Mallampati: II  Neck ROM: full    Dental   Pulmonary asthma , sleep apnea , former smoker,          Cardiovascular hypertension, DVT + dysrhythmias Atrial Fibrillation  H/o WPW   Neuro/Psych    GI/Hepatic GERD-  ,  Endo/Other  obese  Renal/GU      Musculoskeletal   Abdominal   Peds  Hematology   Anesthesia Other Findings   Reproductive/Obstetrics                           Anesthesia Physical Anesthesia Plan  ASA: III  Anesthesia Plan: MAC   Post-op Pain Management:    Induction: Intravenous  Airway Management Planned: Mask  Additional Equipment:   Intra-op Plan:   Post-operative Plan:   Informed Consent: I have reviewed the patients History and Physical, chart, labs and discussed the procedure including the risks, benefits and alternatives for the proposed anesthesia with the patient or authorized representative who has indicated his/her understanding and acceptance.     Plan Discussed with: CRNA, Anesthesiologist and Surgeon  Anesthesia Plan Comments:         Anesthesia Quick Evaluation

## 2014-01-12 NOTE — CV Procedure (Signed)
    CARDIOVERSION NOTE  Procedure: Electrical Cardioversion Indications:  Atrial Fibrillation  Procedure Details:  Consent: Risks of procedure as well as the alternatives and risks of each were explained to the (patient/caregiver).  Consent for procedure obtained.  Time Out: Verified patient identification, verified procedure, site/side was marked, verified correct patient position, special equipment/implants available, medications/allergies/relevent history reviewed, required imaging and test results available.  Performed  Patient placed on cardiac monitor, pulse oximetry, supplemental oxygen as necessary.  Sedation given: Propofol per anesthesia Pacer pads placed anterior and posterior chest.  Cardioverted 2 time(s).  Cardioverted at 200J and 200J biphasic.  Impression: Findings: Post procedure EKG shows: NSR Complications: None Patient did tolerate procedure well.  Plan: 1. Successful DCCV to NSR after 2 shocks.  Time Spent Directly with the Patient:  30 minutes   Pixie Casino, MD, [facility] Attending Cardiologist [facility]  HILTY,Kenneth C 01/12/2014, 9:11 AM

## 2014-01-12 NOTE — Discharge Instructions (Signed)
Electrical Cardioversion °Electrical cardioversion is the delivery of a jolt of electricity to change the rhythm of the heart. Sticky patches or metal paddles are placed on the chest to deliver the electricity from a device. This is done to restore a normal rhythm. A rhythm that is too fast or not regular keeps the heart from pumping well. °Electrical cardioversion is done in an emergency if:  °· There is low or no blood pressure as a result of the heart rhythm.   °· Normal rhythm must be restored as fast as possible to protect the brain and heart from further damage.   °· It may save a life. °Cardioversion may be done for heart rhythms that are not immediately life threatening, such as atrial fibrillation or flutter, in which:  °· The heart is beating too fast or is not regular.   °· Medicine to change the rhythm has not worked.   °· It is safe to wait in order to allow time for preparation. °· Symptoms of the abnormal rhythm are bothersome. °· The risk of stroke and other serious problems can be reduced. °LET YOUR HEALTH CARE PROVIDER KNOW ABOUT:  °· Any allergies you have. °· All medicines you are taking, including vitamins, herbs, eye drops, creams, and over-the-counter medicines. °· Previous problems you or members of your family have had with the use of anesthetics.   °· Any blood disorders you have.   °· Previous surgeries you have had.   °· Medical conditions you have. °RISKS AND COMPLICATIONS  °Generally, this is a safe procedure. However, problems can occur and include:  °· Breathing problems related to the anesthetic used. °· A blood clot that breaks free and travels to other parts of your body. This could cause a stroke or other problems. The risk of this is lowered by use of blood-thinning medicine (anticoagulant) prior to the procedure. °· Cardiac arrest (rare). °BEFORE THE PROCEDURE  °· You may have tests to detect blood clots in your heart and to evaluate heart function.  °· You may start taking  anticoagulants so your blood does not clot as easily.   °· Medicines may be given to help stabilize your heart rate and rhythm. °PROCEDURE °· You will be given medicine through an IV tube to reduce discomfort and make you sleepy (sedative).   °· An electrical shock will be delivered. °AFTER THE PROCEDURE °Your heart rhythm will be watched to make sure it does not change.  °Document Released: 03/13/2002 Document Revised: 08/07/2013 Document Reviewed: 10/05/2012 °ExitCare® Patient Information ©2015 [employer]. This information is not intended to replace advice given to you by your health care provider. Make sure you discuss any questions you have with your health care provider. ° °

## 2014-01-15 ENCOUNTER — Other Ambulatory Visit: Payer: Self-pay | Admitting: *Deleted

## 2014-01-15 ENCOUNTER — Encounter (HOSPITAL_COMMUNITY): Payer: Self-pay | Admitting: Internal Medicine

## 2014-01-15 ENCOUNTER — Other Ambulatory Visit: Payer: Self-pay

## 2014-01-15 MED ORDER — AMIODARONE HCL 200 MG PO TABS: 200.0000 mg | ORAL_TABLET | Freq: Every day | ORAL | Status: DC

## 2014-01-15 MED ORDER — ENALAPRIL MALEATE 20 MG PO TABS: 20.0000 mg | ORAL_TABLET | Freq: Every day | ORAL | Status: DC

## 2014-02-01 ENCOUNTER — Inpatient Hospital Stay (HOSPITAL_COMMUNITY)
Admission: EM | Admit: 2014-02-01 | Discharge: 2014-02-03 | DRG: 308 | Disposition: A | Discharge status: Home / Self Care | Payer: BC Managed Care – PPO | Attending: Family Medicine | Admitting: Family Medicine

## 2014-02-01 ENCOUNTER — Encounter (HOSPITAL_COMMUNITY): Payer: Self-pay | Admitting: Emergency Medicine

## 2014-02-01 ENCOUNTER — Emergency Department (HOSPITAL_COMMUNITY): Payer: BC Managed Care – PPO

## 2014-02-01 DIAGNOSIS — Z833 Family history of diabetes mellitus: Secondary | ICD-10-CM

## 2014-02-01 DIAGNOSIS — G4733 Obstructive sleep apnea (adult) (pediatric): Secondary | ICD-10-CM | POA: Diagnosis present

## 2014-02-01 DIAGNOSIS — I456 Pre-excitation syndrome: Secondary | ICD-10-CM | POA: Diagnosis present

## 2014-02-01 DIAGNOSIS — E780 Pure hypercholesterolemia: Secondary | ICD-10-CM | POA: Diagnosis present

## 2014-02-01 DIAGNOSIS — R0602 Shortness of breath: Secondary | ICD-10-CM | POA: Diagnosis not present

## 2014-02-01 DIAGNOSIS — J449 Chronic obstructive pulmonary disease, unspecified: Secondary | ICD-10-CM | POA: Diagnosis present

## 2014-02-01 DIAGNOSIS — Z8249 Family history of ischemic heart disease and other diseases of the circulatory system: Secondary | ICD-10-CM

## 2014-02-01 DIAGNOSIS — Z9119 Patient's noncompliance with other medical treatment and regimen: Secondary | ICD-10-CM | POA: Diagnosis present

## 2014-02-01 DIAGNOSIS — K219 Gastro-esophageal reflux disease without esophagitis: Secondary | ICD-10-CM | POA: Diagnosis present

## 2014-02-01 DIAGNOSIS — Z87891 Personal history of nicotine dependence: Secondary | ICD-10-CM

## 2014-02-01 DIAGNOSIS — I10 Essential (primary) hypertension: Secondary | ICD-10-CM | POA: Diagnosis present

## 2014-02-01 DIAGNOSIS — Z86718 Personal history of other venous thrombosis and embolism: Secondary | ICD-10-CM

## 2014-02-01 DIAGNOSIS — I481 Persistent atrial fibrillation: Secondary | ICD-10-CM | POA: Diagnosis not present

## 2014-02-01 DIAGNOSIS — I429 Cardiomyopathy, unspecified: Secondary | ICD-10-CM | POA: Diagnosis present

## 2014-02-01 DIAGNOSIS — Z6839 Body mass index (BMI) 39.0-39.9, adult: Secondary | ICD-10-CM

## 2014-02-01 DIAGNOSIS — I5023 Acute on chronic systolic (congestive) heart failure: Secondary | ICD-10-CM | POA: Diagnosis present

## 2014-02-01 DIAGNOSIS — J45909 Unspecified asthma, uncomplicated: Secondary | ICD-10-CM | POA: Diagnosis present

## 2014-02-01 DIAGNOSIS — Z7901 Long term (current) use of anticoagulants: Secondary | ICD-10-CM

## 2014-02-01 DIAGNOSIS — Z23 Encounter for immunization: Secondary | ICD-10-CM

## 2014-02-01 DIAGNOSIS — I4891 Unspecified atrial fibrillation: Secondary | ICD-10-CM

## 2014-02-01 LAB — COMPREHENSIVE METABOLIC PANEL
ALBUMIN: 3.7 g/dL (ref 3.5–5.2)
ALK PHOS: 54 U/L (ref 39–117)
ALT: 25 U/L (ref 0–53)
ANION GAP: 12 (ref 5–15)
AST: 26 U/L (ref 0–37)
BUN: 14 mg/dL (ref 6–23)
CALCIUM: 9 mg/dL (ref 8.4–10.5)
CO2: 27 mEq/L (ref 19–32)
Chloride: 105 mEq/L (ref 96–112)
Creatinine, Ser: 1.25 mg/dL (ref 0.50–1.35)
GFR calc Af Amer: 76 mL/min — ABNORMAL LOW (ref >90)
GFR calc non Af Amer: 66 mL/min — ABNORMAL LOW (ref >90)
Glucose, Bld: 164 mg/dL — ABNORMAL HIGH (ref 70–99)
POTASSIUM: 3.7 meq/L (ref 3.7–5.3)
SODIUM: 144 meq/L (ref 137–147)
Total Bilirubin: 0.6 mg/dL (ref 0.3–1.2)
Total Protein: 6.8 g/dL (ref 6.0–8.3)

## 2014-02-01 LAB — CBC WITH DIFFERENTIAL/PLATELET
Basophils Absolute: 0 10*3/uL (ref 0.0–0.1)
Basophils Relative: 0 % (ref 0–1)
EOS ABS: 0.1 10*3/uL (ref 0.0–0.7)
EOS PCT: 1 % (ref 0–5)
HCT: 39.1 % (ref 39.0–52.0)
Hemoglobin: 13 g/dL (ref 13.0–17.0)
Lymphocytes Relative: 19 % (ref 12–46)
Lymphs Abs: 1.4 10*3/uL (ref 0.7–4.0)
MCH: 31.2 pg (ref 26.0–34.0)
MCHC: 33.2 g/dL (ref 30.0–36.0)
MCV: 93.8 fL (ref 78.0–100.0)
Monocytes Absolute: 0.5 10*3/uL (ref 0.1–1.0)
Monocytes Relative: 7 % (ref 3–12)
NEUTROS PCT: 73 % (ref 43–77)
Neutro Abs: 5.4 10*3/uL (ref 1.7–7.7)
PLATELETS: 264 10*3/uL (ref 150–400)
RBC: 4.17 MIL/uL — ABNORMAL LOW (ref 4.22–5.81)
RDW: 13 % (ref 11.5–15.5)
WBC: 7.5 10*3/uL (ref 4.0–10.5)

## 2014-02-01 LAB — TROPONIN I: Troponin I: <0.3 ng/mL (ref <0.30)

## 2014-02-01 MED ORDER — DILTIAZEM HCL 25 MG/5ML IV SOLN
10.0000 mg | Freq: Once | INTRAVENOUS | Status: AC
  Administered 2014-02-02: 10 mg via INTRAVENOUS
  Filled 2014-02-01: qty 5

## 2014-02-01 MED ORDER — FUROSEMIDE 10 MG/ML IJ SOLN
40.0000 mg | Freq: Once | INTRAMUSCULAR | Status: AC
  Administered 2014-02-02: 40 mg via INTRAVENOUS
  Filled 2014-02-01: qty 4

## 2014-02-01 NOTE — ED Notes (Signed)
Increased heart rate, pulse 100 at home and 8 lb wt gain.  Has had cardioversion 10/9 at [facility].  ,   Increased heart rate.  Swelling of lower ext.

## 2014-02-01 NOTE — ED Notes (Signed)
Patient gave urine sample in room. Placed patient in gown and on cardiac monitor.

## 2014-02-01 NOTE — ED Provider Notes (Signed)
CSN: [id number]     Arrival date & time 02/01/14  2107 History  This chart was scribed for Austin Rice, MD by Molli Posey, ED Scribe. This patient was seen in room APA10/APA10 and the patient's care was started 11:48 PM.      Chief Complaint  Patient presents with  . Atrial Fibrillation   The history is provided by the patient. No language interpreter was used.   HPI Comments: Austin Woods is a 48 y.o. male with a history of HTN and recurrent atrial fibrillation who presents to the Emergency Department complaining of atrial fibrillation with elevated heart rate. Pt had a cardioversion 10/9 at [facility]. He complains of constant SOB for the past week that worsened today. Pt states that his SOB worsens with exertion. He states he can not catch his breath. He reports associated chest discomfort, increased HR, leg swelling and says his chest feels hot. He says his feet feel tingly and "like spiders are crawling up his legs." Pt reports he can tell his heartbeat is irregular. Pt reports he has gained about 8lbs in the past week. He denies cough. Pt is on Xarelto currently.   PCP Wolfgang Phoenix   Past Medical History  Diagnosis Date  . Hypertension   . High cholesterol   . Asthma   . GERD (gastroesophageal reflux disease)   . History of DVT (deep vein thrombosis)     a. left leg following ablation for SVT  . Wolff-Parkinson-White (WPW) syndrome     a. s/p RFCA 2001 by Dr Caryl Comes, pt reports complicated by post procedure DVT  . Persistent atrial fibrillation     a. Dx 08/2012, xarelto initiated; b. 09/2012 Tikosyn initiated -> DCCV -> Sinus.  . Sleep apnea     does not use CPAP (he reports having uvulectomy)    Past Surgical History  Procedure Laterality Date  . Back surgery    . Tonsillectomy    . Stomach surgery      reflux, fundoplication  . Nose surgery      sleep apnea surgery  . Tee without cardioversion N/A 08/25/2012    Procedure: TRANSESOPHAGEAL ECHOCARDIOGRAM (TEE);  Surgeon:  Thayer Headings, MD;  Location: [place];  Service: Cardiovascular;  Laterality: N/A;  . Cardioversion N/A 08/25/2012    Procedure: CARDIOVERSION;  Surgeon: Thayer Headings, MD;  Location: [facility] ENDOSCOPY;  Service: Cardiovascular;  Laterality: N/A;  . Cardioversion N/A 09/06/2012    Procedure: CARDIOVERSION/Bedside;  Surgeon: Carlena Bjornstad, MD;  Location: [place];  Service: Cardiovascular;  Laterality: N/A;  . Tee without cardioversion  03/23/2013    DR ROSS  . Atrial fibrillation ablation  03/23/2013    PVI by DR Rayann Heman for afib  . Tee without cardioversion N/A 03/23/2013    Procedure: TRANSESOPHAGEAL ECHOCARDIOGRAM (TEE);  Surgeon: Fay Records, MD;  Location: [place];  Service: Cardiovascular;  Laterality: N/A;  . Cardioversion N/A 01/12/2014    Procedure: CARDIOVERSION;  Surgeon: Pixie Casino, MD;  Location: [facility] ENDOSCOPY;  Service: Cardiovascular;  Laterality: N/A;  . Cardiac surgery     Family History  Problem Relation Age of Onset  . CAD Father 21  . Diabetes Father   . Heart attack Father   . Diabetes Mother   . Diabetes Brother    History  Substance Use Topics  . Smoking status: Former Smoker    Quit date: 08/06/2010  . Smokeless tobacco: Never Used  . Alcohol Use: No    Review of Systems  Constitutional: Negative for fever and chills.  Respiratory: Positive for shortness of breath. Negative for cough.   Cardiovascular: Positive for palpitations and leg swelling. Negative for chest pain.  Gastrointestinal: Negative for nausea, vomiting, abdominal pain, diarrhea and constipation.  Musculoskeletal: Negative for back pain, neck pain and neck stiffness.  Skin: Negative for rash and wound.  Neurological: Negative for dizziness, weakness, light-headedness, numbness and headaches.  All other systems reviewed and are negative.     Allergies  Review of patient's allergies indicates no known allergies.  Home Medications   Prior to Admission medications    Medication Sig Start Date End Date Taking? Authorizing Provider  albuterol (PROVENTIL HFA) 108 (90 BASE) MCG/ACT inhaler Inhale 2 puffs into the lungs every 4 (four) hours as needed for wheezing or shortness of breath.   Yes Historical Provider, MD  amiodarone (PACERONE) 200 MG tablet Take 1 tablet (200 mg total) by mouth daily. 01/15/14  Yes Herminio Commons, MD  atorvastatin (LIPITOR) 10 MG tablet Take 10 mg by mouth daily.   Yes Historical Provider, MD  azelastine (ASTELIN) 137 MCG/SPRAY nasal spray Place 1 spray into both nostrils 2 (two) times daily.    Yes Historical Provider, MD  Cholecalciferol (VITAMIN D) 2000 UNITS CAPS Take 1 capsule by mouth daily.    Yes Historical Provider, MD  enalapril (VASOTEC) 20 MG tablet Take 1 tablet (20 mg total) by mouth daily. 01/15/14  Yes Herminio Commons, MD  Fluticasone-Salmeterol (ADVAIR DISKUS) 250-50 MCG/DOSE AEPB Inhale 1 puff into the lungs every 12 (twelve) hours.   Yes Historical Provider, MD  hydrochlorothiazide (HYDRODIURIL) 25 MG tablet Take 0.5 tablets (12.5 mg total) by mouth daily. 01/08/14  Yes Thompson Grayer, MD  omeprazole (PRILOSEC) 20 MG capsule Take 20 mg by mouth daily.   Yes Historical Provider, MD  oxybutynin (DITROPAN) 5 MG tablet Take 1 tablet by mouth 2 (two) times daily as needed for bladder spasms.  05/04/13  Yes Historical Provider, MD  rivaroxaban (XARELTO) 20 MG TABS tablet Take 20 mg by mouth daily with supper.   Yes Historical Provider, MD  meloxicam (MOBIC) 15 MG tablet Take 15 mg by mouth daily as needed for pain.  12/08/12   Thompson Grayer, MD  mometasone-formoterol (DULERA) 100-5 MCG/ACT AERO Inhale 2 puffs into the lungs 2 (two) times daily.    Historical Provider, MD  tadalafil (CIALIS) 5 MG tablet Take 5 mg by mouth daily as needed for erectile dysfunction.    Historical Provider, MD   BP 151/97  Pulse 92  Temp(Src) 98.8 F (37.1 C) (Oral)  Resp 21  Ht 6\' 1"  (1.854 m)  Wt 297 lb (134.718 kg)  BMI 39.19 kg/m2   SpO2 97%  Physical Exam  Nursing note and vitals reviewed. Constitutional: He is oriented to person, place, and time. He appears well-developed and well-nourished. No distress.  HENT:  Head: Normocephalic and atraumatic.  Mouth/Throat: Oropharynx is clear and moist.  Eyes: EOM are normal. Pupils are equal, round, and reactive to light.  Neck: Normal range of motion. Neck supple.  Cardiovascular:  Tachycardia irregularly irregular heart rate  Pulmonary/Chest: Effort normal and breath sounds normal. No respiratory distress. He has no wheezes. He has no rales. He exhibits no tenderness.  Abdominal: Soft. Bowel sounds are normal. He exhibits no distension and no mass. There is no tenderness. There is no rebound and no guarding.  Musculoskeletal: Normal range of motion. He exhibits no edema and no tenderness.  1+ pitting edema bilateral lower  extremities. No tenderness.  Neurological: He is alert and oriented to person, place, and time.  Skin: Skin is warm and dry. No rash noted. No erythema.  Psychiatric: He has a normal mood and affect. His behavior is normal.    ED Course  Procedures  DIAGNOSTIC STUDIES: Oxygen Saturation is 97% on RA, normal by my interpretation.    COORDINATION OF CARE: 11:56 PM Discussed treatment plan with pt at bedside and pt agreed to plan.   Labs Review Labs Reviewed  CBC WITH DIFFERENTIAL - Abnormal; Notable for the following:    RBC 4.17 (*)    All other components within normal limits  COMPREHENSIVE METABOLIC PANEL - Abnormal; Notable for the following:    Glucose, Bld 164 (*)    GFR calc non Af Amer 66 (*)    GFR calc Af Amer 76 (*)    All other components within normal limits  TROPONIN I    Imaging Review Dg Chest 1 View  02/01/2014   CLINICAL DATA:  Increased heart rate at home and 8 lbs weight gain today. Pt is s/p cardioversion @ [facility] 01/12/2014. Pt c/o chest tightness and Rt lower extremity pain. Hx A-fib, sleep apnea, DVT, GERD, asthma,  HTN, former smoker, cardioversion: 08/25/12, 09/06/12, 03/23/13, 01/12/14  EXAM: CHEST - 1 VIEW  COMPARISON:  DG CHEST 2 VIEW dated 08/05/2012  FINDINGS: There is no focal parenchymal opacity, pleural effusion, or pneumothorax. There is stable cardiomegaly.  The osseous structures are unremarkable.  IMPRESSION: No active disease.   Electronically Signed   By: Kathreen Devoid   On: 02/01/2014 22:33     EKG Interpretation None      MDM   Final diagnoses:  SOB (shortness of breath)    I personally performed the services described in this documentation, which was scribed in my presence. The recorded information has been reviewed and is accurate.  Discussed with cardiology and recommends admission to medicine. Will evaluate the patient in the morning. Advised keep patient NPO for possible cardioversion   Austin Rice, MD 02/02/14 [phone]

## 2014-02-02 ENCOUNTER — Encounter (HOSPITAL_COMMUNITY): Payer: Self-pay

## 2014-02-02 DIAGNOSIS — Z6839 Body mass index (BMI) 39.0-39.9, adult: Secondary | ICD-10-CM | POA: Diagnosis not present

## 2014-02-02 DIAGNOSIS — I10 Essential (primary) hypertension: Secondary | ICD-10-CM

## 2014-02-02 DIAGNOSIS — I456 Pre-excitation syndrome: Secondary | ICD-10-CM

## 2014-02-02 DIAGNOSIS — Z23 Encounter for immunization: Secondary | ICD-10-CM | POA: Diagnosis not present

## 2014-02-02 DIAGNOSIS — E780 Pure hypercholesterolemia: Secondary | ICD-10-CM | POA: Diagnosis present

## 2014-02-02 DIAGNOSIS — Z86718 Personal history of other venous thrombosis and embolism: Secondary | ICD-10-CM | POA: Diagnosis not present

## 2014-02-02 DIAGNOSIS — I429 Cardiomyopathy, unspecified: Secondary | ICD-10-CM

## 2014-02-02 DIAGNOSIS — Z8249 Family history of ischemic heart disease and other diseases of the circulatory system: Secondary | ICD-10-CM | POA: Diagnosis not present

## 2014-02-02 DIAGNOSIS — Z7901 Long term (current) use of anticoagulants: Secondary | ICD-10-CM | POA: Diagnosis not present

## 2014-02-02 DIAGNOSIS — K219 Gastro-esophageal reflux disease without esophagitis: Secondary | ICD-10-CM | POA: Diagnosis present

## 2014-02-02 DIAGNOSIS — J449 Chronic obstructive pulmonary disease, unspecified: Secondary | ICD-10-CM | POA: Diagnosis present

## 2014-02-02 DIAGNOSIS — I4891 Unspecified atrial fibrillation: Secondary | ICD-10-CM | POA: Diagnosis present

## 2014-02-02 DIAGNOSIS — J45909 Unspecified asthma, uncomplicated: Secondary | ICD-10-CM | POA: Diagnosis present

## 2014-02-02 DIAGNOSIS — G4733 Obstructive sleep apnea (adult) (pediatric): Secondary | ICD-10-CM

## 2014-02-02 DIAGNOSIS — Z87891 Personal history of nicotine dependence: Secondary | ICD-10-CM | POA: Diagnosis not present

## 2014-02-02 DIAGNOSIS — R0602 Shortness of breath: Secondary | ICD-10-CM | POA: Diagnosis present

## 2014-02-02 DIAGNOSIS — Z9119 Patient's noncompliance with other medical treatment and regimen: Secondary | ICD-10-CM | POA: Diagnosis present

## 2014-02-02 DIAGNOSIS — I5023 Acute on chronic systolic (congestive) heart failure: Secondary | ICD-10-CM | POA: Diagnosis present

## 2014-02-02 DIAGNOSIS — I481 Persistent atrial fibrillation: Secondary | ICD-10-CM | POA: Diagnosis present

## 2014-02-02 DIAGNOSIS — Z833 Family history of diabetes mellitus: Secondary | ICD-10-CM | POA: Diagnosis not present

## 2014-02-02 LAB — CBC
HEMATOCRIT: 39.8 % (ref 39.0–52.0)
HEMOGLOBIN: 13.2 g/dL (ref 13.0–17.0)
MCH: 30.8 pg (ref 26.0–34.0)
MCHC: 33.2 g/dL (ref 30.0–36.0)
MCV: 93 fL (ref 78.0–100.0)
Platelets: 289 10*3/uL (ref 150–400)
RBC: 4.28 MIL/uL (ref 4.22–5.81)
RDW: 13.2 % (ref 11.5–15.5)
WBC: 8.9 10*3/uL (ref 4.0–10.5)

## 2014-02-02 LAB — BASIC METABOLIC PANEL
Anion gap: 13 (ref 5–15)
BUN: 14 mg/dL (ref 6–23)
CHLORIDE: 103 meq/L (ref 96–112)
CO2: 29 meq/L (ref 19–32)
CREATININE: 1.32 mg/dL (ref 0.50–1.35)
Calcium: 9.1 mg/dL (ref 8.4–10.5)
GFR calc non Af Amer: 61 mL/min — ABNORMAL LOW (ref >90)
GFR, EST AFRICAN AMERICAN: 71 mL/min — AB (ref >90)
GLUCOSE: 118 mg/dL — AB (ref 70–99)
POTASSIUM: 3.7 meq/L (ref 3.7–5.3)
Sodium: 145 mEq/L (ref 137–147)

## 2014-02-02 LAB — MAGNESIUM: Magnesium: 2.1 mg/dL (ref 1.5–2.5)

## 2014-02-02 LAB — MRSA PCR SCREENING: MRSA BY PCR: NEGATIVE

## 2014-02-02 LAB — PRO B NATRIURETIC PEPTIDE: PRO B NATRI PEPTIDE: 407.9 pg/mL — AB (ref 0–125)

## 2014-02-02 MED ORDER — AMIODARONE HCL 200 MG PO TABS
200.0000 mg | ORAL_TABLET | Freq: Every day | ORAL | Status: DC
  Administered 2014-02-02 – 2014-02-03 (×2): 200 mg via ORAL
  Filled 2014-02-02 (×2): qty 1

## 2014-02-02 MED ORDER — RIVAROXABAN 15 MG PO TABS: 15.0000 mg | ORAL_TABLET | Freq: Once | ORAL | Status: DC

## 2014-02-02 MED ORDER — SODIUM CHLORIDE 0.9 % IJ SOLN
3.0000 mL | Freq: Two times a day (BID) | INTRAMUSCULAR | Status: DC
  Administered 2014-02-02 – 2014-02-03 (×2): 3 mL via INTRAVENOUS

## 2014-02-02 MED ORDER — RIVAROXABAN 20 MG PO TABS
20.0000 mg | ORAL_TABLET | Freq: Every day | ORAL | Status: DC
  Filled 2014-02-02: qty 1

## 2014-02-02 MED ORDER — ATORVASTATIN CALCIUM 10 MG PO TABS
10.0000 mg | ORAL_TABLET | Freq: Every day | ORAL | Status: DC
  Administered 2014-02-02 – 2014-02-03 (×2): 10 mg via ORAL
  Filled 2014-02-02 (×2): qty 1

## 2014-02-02 MED ORDER — HYDROCHLOROTHIAZIDE 25 MG PO TABS
12.5000 mg | ORAL_TABLET | Freq: Every day | ORAL | Status: DC
  Administered 2014-02-02 – 2014-02-03 (×2): 12.5 mg via ORAL
  Filled 2014-02-02 (×2): qty 1

## 2014-02-02 MED ORDER — SODIUM CHLORIDE 0.9 % IJ SOLN: 3.0000 mL | INTRAMUSCULAR | Status: DC | PRN

## 2014-02-02 MED ORDER — OXYBUTYNIN CHLORIDE 5 MG PO TABS: 5.0000 mg | ORAL_TABLET | Freq: Two times a day (BID) | ORAL | Status: DC | PRN

## 2014-02-02 MED ORDER — DILTIAZEM HCL 100 MG IV SOLR
5.0000 mg/h | INTRAVENOUS | Status: DC
  Filled 2014-02-02: qty 100

## 2014-02-02 MED ORDER — ACETAMINOPHEN 500 MG PO TABS
500.0000 mg | ORAL_TABLET | Freq: Once | ORAL | Status: AC
  Administered 2014-02-02: 500 mg via ORAL
  Filled 2014-02-02: qty 1

## 2014-02-02 MED ORDER — METOPROLOL SUCCINATE ER 25 MG PO TB24
25.0000 mg | ORAL_TABLET | Freq: Every day | ORAL | Status: DC
  Administered 2014-02-02 – 2014-02-03 (×2): 25 mg via ORAL
  Filled 2014-02-02 (×2): qty 1

## 2014-02-02 MED ORDER — RIVAROXABAN 20 MG PO TABS
20.0000 mg | ORAL_TABLET | Freq: Every day | ORAL | Status: DC
  Administered 2014-02-02 (×2): 20 mg via ORAL
  Filled 2014-02-02: qty 1

## 2014-02-02 MED ORDER — MOMETASONE FURO-FORMOTEROL FUM 100-5 MCG/ACT IN AERO
2.0000 | INHALATION_SPRAY | Freq: Two times a day (BID) | RESPIRATORY_TRACT | Status: DC
  Administered 2014-02-02 – 2014-02-03 (×2): 2 via RESPIRATORY_TRACT
  Filled 2014-02-02: qty 8.8

## 2014-02-02 MED ORDER — AZELASTINE HCL 0.1 % NA SOLN
1.0000 | Freq: Two times a day (BID) | NASAL | Status: DC
  Administered 2014-02-02 – 2014-02-03 (×3): 1 via NASAL
  Filled 2014-02-02: qty 30

## 2014-02-02 MED ORDER — DILTIAZEM HCL 100 MG IV SOLR
5.0000 mg/h | Freq: Once | INTRAVENOUS | Status: AC
  Administered 2014-02-02: 5 mg/h via INTRAVENOUS
  Filled 2014-02-02: qty 100

## 2014-02-02 MED ORDER — IBUPROFEN 800 MG PO TABS: 800.0000 mg | ORAL_TABLET | Freq: Four times a day (QID) | ORAL | Status: DC | PRN

## 2014-02-02 MED ORDER — HYDRALAZINE HCL 10 MG PO TABS
10.0000 mg | ORAL_TABLET | Freq: Three times a day (TID) | ORAL | Status: DC
  Administered 2014-02-02 – 2014-02-03 (×4): 10 mg via ORAL
  Filled 2014-02-02 (×10): qty 1

## 2014-02-02 MED ORDER — ALBUTEROL SULFATE (2.5 MG/3ML) 0.083% IN NEBU: 3.0000 mL | INHALATION_SOLUTION | RESPIRATORY_TRACT | Status: DC | PRN

## 2014-02-02 MED ORDER — AMIODARONE HCL 200 MG PO TABS: 200.0000 mg | ORAL_TABLET | Freq: Two times a day (BID) | ORAL | Status: DC

## 2014-02-02 MED ORDER — AMIODARONE HCL 200 MG PO TABS: 200.0000 mg | ORAL_TABLET | Freq: Every day | ORAL | Status: DC

## 2014-02-02 MED ORDER — ALPRAZOLAM 0.5 MG PO TABS
0.5000 mg | ORAL_TABLET | Freq: Every evening | ORAL | Status: DC | PRN
  Administered 2014-02-02: 0.5 mg via ORAL
  Filled 2014-02-02: qty 1

## 2014-02-02 MED ORDER — SODIUM CHLORIDE 0.9 % IV SOLN: 250.0000 mL | INTRAVENOUS | Status: DC | PRN

## 2014-02-02 MED ORDER — IBUPROFEN 800 MG PO TABS
800.0000 mg | ORAL_TABLET | Freq: Three times a day (TID) | ORAL | Status: DC | PRN
  Administered 2014-02-02 – 2014-02-03 (×2): 800 mg via ORAL
  Filled 2014-02-02 (×2): qty 1

## 2014-02-02 MED ORDER — ENALAPRIL MALEATE 5 MG PO TABS
20.0000 mg | ORAL_TABLET | Freq: Every day | ORAL | Status: DC
  Administered 2014-02-02 – 2014-02-03 (×2): 20 mg via ORAL
  Filled 2014-02-02 (×2): qty 4

## 2014-02-02 NOTE — Care Management Note (Signed)
    Page 1 of 1   02/02/2014     2:21:22 PM CARE MANAGEMENT NOTE 02/02/2014  Patient:  Austin Woods, Austin Woods   Account Number:  [id number]  Date Initiated:  02/02/2014  Documentation initiated by:  Theophilus Kinds  Subjective/Objective Assessment:   Pt admitted from home with A fib. Pt lives with his wife and will retun home at discharge. Pt is independent with ADL's.     Action/Plan:   No CM needs noted.   Anticipated DC Date:  02/03/2014   Anticipated DC Plan:  [place]  CM consult      Choice offered to / List presented to:             Status of service:  Completed, signed off Medicare Important Message given?   (If response is "NO", the following Medicare IM given date fields will be blank) Date Medicare IM given:   Medicare IM given by:   Date Additional Medicare IM given:   Additional Medicare IM given by:    Discharge Disposition:  HOME/SELF CARE  Per UR Regulation:    If discussed at Long Length of Stay Meetings, dates discussed:    Comments:  02/02/14 [place], RN BSN CM

## 2014-02-02 NOTE — Consult Note (Addendum)
CARDIOLOGY CONSULT NOTE   Patient ID: Austin Woods MRN: [id number] DOB/AGE: 10/08/1965 48 y.o.  Admit Date: 02/01/2014 Referring Physician: PTH  Primary Physician: [name], MD Consulting Cardiologist: Carlyle Dolly MD Primary Cardiologist: Kate Sable MD EP: Thompson Grayer MD Reason for Consultation: Atrial fib with RVR  Clinical Summary [name] is a 48 y.o.male with longstanding history of persistent atrial fibrillation, most recently admitted to OP for DCCV per Dr. Debara Pickett on 01/12/2014 which was successful, chronic anticoagulation therapy on Xarelto, rate control on amiodarone, tachycardic CM, has failed Tikosyn, and Flecainide therapy, has had RFA  In December of 2014 per Dr. Rayann Heman, and hx of WPW.   He states that he has been in his usual state of health but having chronic fatigue. He works second shift (4p-12p) and while at work began to have pain in his LE, with tightness and cramping. He felt generally bad. 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 Sent to ER.   In ER BP 136/89, HR 76, with EKG showing atrial fib with HR of 109 bpm. Subsequent BPs were elevated with max at 160/111. CXR and labs were nremarkable.  He was started on diltiazem gtt, given IV lasix and admitted to ICU for rate control. He has diuresed 2,645 cc of fluid since admission. He is now complaining of chest pressure "feelilng like someone is constantly sitting on my chest" and feeling very tired and sore in his chest.   He denies medical non-compliance, but does not use CPAP due to sinus issus and GERD. He also admits to eating a lot of salty fast foods, and not adhering to low calorie diet.  Other history includes Hypertension, Morbid Obesity, OSA, Hypecholesterolemia. Review of records did not reveal any ischemic testing in the past.    No Known Allergies  Medications Scheduled Medications: . amiodarone  200 mg Oral Daily  . atorvastatin   10 mg Oral Daily  . azelastine  1 spray Each Nare BID  . enalapril  20 mg Oral Daily  . hydrochlorothiazide  12.5 mg Oral Daily  . mometasone-formoterol  2 puff Inhalation BID  . rivaroxaban  20 mg Oral Q0200  . sodium chloride  3 mL Intravenous Q12H    Infusions: . diltiazem (CARDIZEM) infusion 10 mg/hr (02/02/14 0900)    PRN Medications: sodium chloride, albuterol, oxybutynin, sodium chloride   Past Medical History  Diagnosis Date  . Hypertension   . High cholesterol   . Asthma   . GERD (gastroesophageal reflux disease)   . History of DVT (deep vein thrombosis)     a. left leg following ablation for SVT  . Wolff-Parkinson-White (WPW) syndrome     a. s/p RFCA 2001 by Dr Caryl Comes, pt reports complicated by post procedure DVT  . Persistent atrial fibrillation     a. Dx 08/2012, xarelto initiated; b. 09/2012 Tikosyn initiated -> DCCV -> Sinus.  . Sleep apnea     does not use CPAP (he reports having uvulectomy)     Past Surgical History  Procedure Laterality Date  . Back surgery    . Tonsillectomy    . Stomach surgery      reflux, fundoplication  . Nose surgery      sleep apnea surgery  . Tee without cardioversion N/A 08/25/2012    Procedure: TRANSESOPHAGEAL ECHOCARDIOGRAM (TEE);  Surgeon: Thayer Headings, MD;  Location: [place];  Service: Cardiovascular;  Laterality: N/A;  .  Cardioversion N/A 08/25/2012    Procedure: CARDIOVERSION;  Surgeon: Thayer Headings, MD;  Location: [facility] ENDOSCOPY;  Service: Cardiovascular;  Laterality: N/A;  . Cardioversion N/A 09/06/2012    Procedure: CARDIOVERSION/Bedside;  Surgeon: Carlena Bjornstad, MD;  Location: [place];  Service: Cardiovascular;  Laterality: N/A;  . Tee without cardioversion  03/23/2013    DR ROSS  . Atrial fibrillation ablation  03/23/2013    PVI by DR Rayann Heman for afib  . Tee without cardioversion N/A 03/23/2013    Procedure: TRANSESOPHAGEAL ECHOCARDIOGRAM (TEE);  Surgeon: Fay Records, MD;  Location: [place];  Service:  Cardiovascular;  Laterality: N/A;  . Cardioversion N/A 01/12/2014    Procedure: CARDIOVERSION;  Surgeon: Pixie Casino, MD;  Location: [facility] ENDOSCOPY;  Service: Cardiovascular;  Laterality: N/A;  . Cardiac surgery      Family History  Problem Relation Age of Onset  . CAD Father 28  . Diabetes Father   . Heart attack Father   . Diabetes Mother   . Diabetes Brother     Social History [name] reports that he quit smoking about 3 years ago. He has never used smokeless tobacco. Mr. Pullin reports that he does not drink alcohol.  Review of Systems Otherwise reviewed and negative except as outlined.  Physical Examination Blood pressure 145/79, pulse 73, temperature 97 F (36.1 C), temperature source Oral, resp. rate 17, height 6\' 2"  (1.88 m), weight 286 lb 13.1 oz (130.1 kg), SpO2 98.00%.  Intake/Output Summary (Last 24 hours) at 02/02/14 0952 Last data filed at 02/02/14 0900  Gross per 24 hour  Intake  54.08 ml  Output   2700 ml  Net -2645.92 ml    Telemetry:Atrial fib rates between 80-97 bpm.   HYQ:MVHQION, complaining of chest pressure. HEENT: Conjunctiva and lids normal, oropharynx clear with moist mucosa. Neck: Supple, no elevated JVP or carotid bruits, no thyromegaly. Lungs: Clear to auscultation, nonlabored breathing at rest. Cardiac: Irregular rate and rhythm, no S3 or significant systolic murmur, no pericardial rub. Abdomen: Soft, nontender, no hepatomegaly, bowel sounds present, no guarding or rebound. Extremities: No pitting edema, distal pulses 2+. Skin: Warm and dry. Musculoskeletal: No kyphosis. Neuropsychiatric: Alert and oriented x3, affect grossly appropriate.  Prior Cardiac Testing/Procedures  1. DCCV 01/12/2014 Cardioverted 2 time(s).  Cardioverted at 200J and 200J biphasic.  Impression:  Findings: Post procedure EKG shows: NSR  Complications: None   2. RF Ablation 03/23/2013 1. Sinus rhythm upon presentation.  2. Intracardiac echo reveals a  moderate sized left atrium with four separate pulmonary veins without evidence of pulmonary vein stenosis.  3. Successful electrical isolation and anatomical encircling of all four pulmonary veins with radiofrequency current.  4. No inducible arrhythmias following ablation both on and off of Isuprel  5. No early apparent complications.  3. TEE 03/23/2013 Left atrium: No evidence of thrombus in the atrial cavity or appendage.   4. Echocardiogram 09/05/2012 Left ventricle: The cavity size was mildly dilated. Wall thickness was normal. Systolic function was moderately reduced. The estimated ejection fraction was in the range of 35% to 40%. Wall motion was normal; there were no regional wall motion abnormalities. - Mitral valve: Mild regurgitation. - Left atrium: The atrium was mildly to moderately dilated. - Right atrium: The atrium was mildly dilated.   Lab Results  Basic Metabolic Panel:  Recent Labs Lab 02/01/14 2145 02/02/14 0554  NA 144 145  K 3.7 3.7  CL 105 103  CO2 27 29  GLUCOSE 164* 118*  BUN  14 14  CREATININE 1.25 1.32  CALCIUM 9.0 9.1    Liver Function Tests:  Recent Labs Lab 02/01/14 2145  AST 26  ALT 25  ALKPHOS 54  BILITOT 0.6  PROT 6.8  ALBUMIN 3.7    CBC:  Recent Labs Lab 02/01/14 2145 02/02/14 0554  WBC 7.5 8.9  NEUTROABS 5.4  --   HGB 13.0 13.2  HCT 39.1 39.8  MCV 93.8 93.0  PLT 264 289    Cardiac Enzymes:  Recent Labs Lab 02/01/14 2145  TROPONINI <0.30    BNP: 407.0   Radiology: Dg Chest 1 View  02/01/2014   CLINICAL DATA:  Increased heart rate at home and 8 lbs weight gain today. Pt is s/p cardioversion @ [facility] 01/12/2014. Pt c/o chest tightness and Rt lower extremity pain. Hx A-fib, sleep apnea, DVT, GERD, asthma, HTN, former smoker, cardioversion: 08/25/12, 09/06/12, 03/23/13, 01/12/14  EXAM: CHEST - 1 VIEW  COMPARISON:  DG CHEST 2 VIEW dated 08/05/2012  FINDINGS: There is no focal parenchymal opacity, pleural effusion, or  pneumothorax. There is stable cardiomegaly.  The osseous structures are unremarkable.  IMPRESSION: No active disease.   Electronically Signed   By: Kathreen Devoid   On: 02/01/2014 22:33     ECG: Atrial fib with rate of 106 bpm.   Impression and Recommendations  1.Atrial fibrillation: Multiple EP interventions, both pharmacologic and RA ablation with most recent intervention as DCCV 2 weeks ago that was successful albeit temporarily. He is now on diltiazem gtt which will be discontinued due to systolic dysfunction with most recent EF of 35%-40% in June of 2014. He should continue on amiodarone. (May consider reloading after being seen by Dr. Harl Bowie)  He has appt with Atrial fibrillation clinic on Nov 10th in [place]. Continue anticoagulation therapy rivaroxaban 20 mg daily.   2. Tachycardic CM: As stated his most recent echo demonstrated EF of 35%-40%. On review of records, he does not have documented ischemic evaluation. Could consider this as OP. Repeat Echo for revaluation of systolic fx. Continue ACE inhibitor. CVRF to include hypertension, hypercholesterolemia. Obesity, FH. Troponin negative ruling out ACS as cause of symptoms. Add potassium for keep level of 4.0.HE complaints of constant chest pressure now.   3. OSA: Non-compliant with CPAP with sinus and GERD symptoms. Contributing to recurrence of atrial fib.  4. Hypercholesterolema: Does not adhere to low cholesterol diet. Eats fast food daily. Continues on statin therapy with Lipitor  5. Hypertension:  OSA, weight, high salt intake, daily use of Mobic contributing. On ACE, Creatinine of 1.32. Not well controlled for patient with reduced EF. Repeat echo. Consider adding hydralazine or nitrate to medication regimen. Has been started on HCTZ this admission.         Signed: Phill Myron. Lawrence NP  02/02/2014, 9:52 AM Co-Sign MD  Patient seen and discussed with NP Purcell Nails. 48 yo male hx of persistent afib with recent DCCV 01/12/14  currently on amio (previously failed flecanide and [place]), chronic anticoag with xarelto. He had EP study and afib ablation by Dr Rayann Heman 03/2013. History of chronic systolic HF LVEF 57-84% by echo 09/2012 thought to be tachycardiac induced CM admitted generalized fatigue. Noted to be in afib with mildly elevated rates, started on dilt gtt. Describes constant mild aching chest pain right chest constant for 2 days, somewhat worst with deep breathing  EKG afib rate 106 Trop neg x1, K 3.7, Cr 1.25, Hgb 13, plt 264, pro-BNP 407 CXR no acute process Will add Mg on  to AM labs   Afib rates currently controlled, due to systolic dysfunction we will discontinue dilt gtt. He has not been on a beta blocker, somewhat unclear from notes but appears he has had some low HRs on lopressor in the past (mid 50s) and it was discontinued. Given his afib and systolic dysfunction, and failure of multiple rhythm control strategies, will try low dose Toprol XL to focus on rate control strategy. Given his rates are now controlled do not see strong indication for transfer, if needed may contact the on call cardiology at [facility] on the weekend is status changes.   Acute on chronic systolic HF, likely secondary to dietary non-compliance. Negative 2.7 liters since admit, renal function remains stable, he received lasix IV 40mg  x 1 yesterday. Appears euvolemic on exam, would not redose lasix at this time. Start low dose beta blocker, defer possible ischemic testing as outpatient. F/u repeat echo. His chest pain is not ischemic by history, no plans for ischemic testing at this time.   Zandra Abts MD

## 2014-02-02 NOTE — Progress Notes (Signed)
9:45 AM I agree with HPI/GPe and A/P per Dr. Shanon Brow  48 y/o ?, WPW syndrome as/p RAF first in 2001 + Afib CHad2Vasc2 score=3 on Xarelto, Tachycardia mediated CM, Pulm Htn on Tadalfil, Htn, COPD.  He has had multiple attempts at Cardioversion 5/14,09/06/12,  been trialed on Tikosyn then amiodarone and is on Xarelto for [facility].  SOme documantation that he has traaled Flecainide and Dofetilide and failed  He is noted to have been in sinus for some period of time between 6/14-9/14.  He had a catheter ablation 03/23/13.  He also had a recent cardioversion 01/12/14 and converted to NSR with 2 shocks Admitted overnight with progressive SOB and found to be in Afib adn started on Cardzem Gtt. Cardiology consulted  Doing okay Felt some chest pressure earlier today and Ha NO SOB currently, no radiating quality of CP  HEENT CHEST CARDIAC ABDOMEN NEURO SKIN/MUSCULAR  Patient Active Problem List   Diagnosis Date Noted  . Atrial fibrillation with RVR 02/02/2014  . Obstructive sleep apnea 10/12/2012  . WPW (Wolff-Parkinson-White syndrome) 09/05/2012  . Hypokalemia 09/05/2012  . Secondary cardiomyopathy 09/03/2012  . Essential hypertension, benign 09/03/2012  . Fatigue 08/15/2012  . Morbid obesity 08/15/2012  . Atrial fibrillation 08/07/2012   Afib c RVR-Cardiology recommended  Cardizem Gtt to toprol 25 XL, continue Amiodarone 200 daily  Monitor on SDU overngiht.  If needed will discuss with Cardiology coverage Acute-on-chronic Systolic WU--8.8 liters.  continue plan as per cardiology-Lasix 40 mg IV given once.  ECHO from today is pending Tachycardia mediated CM-Monitor-NO inpatient work-up for CP recommended at this time Htn-monitor.  Adjust medicaitons if needed for rate control.  Keep on SDU overnight  Verneita Griffes, MD [facility] [phone]

## 2014-02-02 NOTE — Plan of Care (Signed)
Problem: Phase I Progression Outcomes Goal: Ventricular heart rate < 120/min Outcome: Progressing Currently on Cardizem drip being titrated per order Goal: Anticoagulation Therapy per MD order Outcome: Completed/Met Date Met:  02/02/14 Taking Xeralto Goal: Heart rate or rhythm control medication Outcome: Progressing Currently on Cardizem drip Goal: Pain controlled with appropriate interventions Outcome: Completed/Met Date Met:  02/02/14 Denies any pain at this time Goal: Initial discharge plan identified Outcome: Completed/Met Date Met:  02/02/14 Home Goal: Hemodynamically stable Outcome: Progressing Currently stable on medications

## 2014-02-02 NOTE — H&P (Signed)
PCP:   Austin Battiest, MD   Chief Complaint:  sob  HPI: 48 yo male h/o WPW, afib, osa cpap dependent but has not used it in several months, obesity, dvt comes in with a day of sob and palpitations.  He was not feeling well at work, went to see the PA there and was told to come to the ED b/c his heart rate was so fast.  He went home first then came to the Ed.  He was sob.  No cp.  Says his legs were swollen earlier today but not now.  He has had several ablations and cardioversions in the past, his last cardioversion for his afib was about 2 weeks ago.  He is chronically anticoagulated.  He reports he has not been using his cpap machine because his gerd is so bad again (s/p fundoplication years ago).  No fevers.  No cough.  No n/v/d.  Feels better now that his rate is down on a cardizem gtt.  Review of Systems:  Positive and negative as per HPI otherwise all other systems are negative  Past Medical History: Past Medical History  Diagnosis Date  . Hypertension   . High cholesterol   . Asthma   . GERD (gastroesophageal reflux disease)   . History of DVT (deep vein thrombosis)     a. left leg following ablation for SVT  . Wolff-Parkinson-White (WPW) syndrome     a. s/p RFCA 2001 by Dr Caryl Comes, pt reports complicated by post procedure DVT  . Persistent atrial fibrillation     a. Dx 08/2012, xarelto initiated; b. 09/2012 Tikosyn initiated -> DCCV -> Sinus.  . Sleep apnea     does not use CPAP (he reports having uvulectomy)    Past Surgical History  Procedure Laterality Date  . Back surgery    . Tonsillectomy    . Stomach surgery      reflux, fundoplication  . Nose surgery      sleep apnea surgery  . Tee without cardioversion N/A 08/25/2012    Procedure: TRANSESOPHAGEAL ECHOCARDIOGRAM (TEE);  Surgeon: Thayer Headings, MD;  Location: [place];  Service: Cardiovascular;  Laterality: N/A;  . Cardioversion N/A 08/25/2012    Procedure: CARDIOVERSION;  Surgeon: Thayer Headings, MD;  Location:  [facility] ENDOSCOPY;  Service: Cardiovascular;  Laterality: N/A;  . Cardioversion N/A 09/06/2012    Procedure: CARDIOVERSION/Bedside;  Surgeon: Carlena Bjornstad, MD;  Location: [place];  Service: Cardiovascular;  Laterality: N/A;  . Tee without cardioversion  03/23/2013    DR ROSS  . Atrial fibrillation ablation  03/23/2013    PVI by DR Rayann Heman for afib  . Tee without cardioversion N/A 03/23/2013    Procedure: TRANSESOPHAGEAL ECHOCARDIOGRAM (TEE);  Surgeon: Fay Records, MD;  Location: [place];  Service: Cardiovascular;  Laterality: N/A;  . Cardioversion N/A 01/12/2014    Procedure: CARDIOVERSION;  Surgeon: Pixie Casino, MD;  Location: [facility] ENDOSCOPY;  Service: Cardiovascular;  Laterality: N/A;  . Cardiac surgery      Medications: Prior to Admission medications   Medication Sig Start Date End Date Taking? Authorizing Provider  albuterol (PROVENTIL HFA) 108 (90 BASE) MCG/ACT inhaler Inhale 2 puffs into the lungs every 4 (four) hours as needed for wheezing or shortness of breath.   Yes Historical Provider, MD  amiodarone (PACERONE) 200 MG tablet Take 1 tablet (200 mg total) by mouth daily. 01/15/14  Yes Herminio Commons, MD  atorvastatin (LIPITOR) 10 MG tablet Take 10 mg by mouth daily.  Yes Historical Provider, MD  azelastine (ASTELIN) 137 MCG/SPRAY nasal spray Place 1 spray into both nostrils 2 (two) times daily.    Yes Historical Provider, MD  Cholecalciferol (VITAMIN D) 2000 UNITS CAPS Take 1 capsule by mouth daily.    Yes Historical Provider, MD  enalapril (VASOTEC) 20 MG tablet Take 1 tablet (20 mg total) by mouth daily. 01/15/14  Yes Herminio Commons, MD  Fluticasone-Salmeterol (ADVAIR DISKUS) 250-50 MCG/DOSE AEPB Inhale 1 puff into the lungs every 12 (twelve) hours.   Yes Historical Provider, MD  hydrochlorothiazide (HYDRODIURIL) 25 MG tablet Take 0.5 tablets (12.5 mg total) by mouth daily. 01/08/14  Yes Thompson Grayer, MD  omeprazole (PRILOSEC) 20 MG capsule Take 20 mg by mouth daily.    Yes Historical Provider, MD  oxybutynin (DITROPAN) 5 MG tablet Take 1 tablet by mouth 2 (two) times daily as needed for bladder spasms.  05/04/13  Yes Historical Provider, MD  rivaroxaban (XARELTO) 20 MG TABS tablet Take 20 mg by mouth daily with supper.   Yes Historical Provider, MD  meloxicam (MOBIC) 15 MG tablet Take 15 mg by mouth daily as needed for pain.  12/08/12   Thompson Grayer, MD  mometasone-formoterol (DULERA) 100-5 MCG/ACT AERO Inhale 2 puffs into the lungs 2 (two) times daily.    Historical Provider, MD  tadalafil (CIALIS) 5 MG tablet Take 5 mg by mouth daily as needed for erectile dysfunction.    Historical Provider, MD    Allergies:  No Known Allergies  Social History:  reports that he quit smoking about 3 years ago. He has never used smokeless tobacco. He reports that he does not drink alcohol or use illicit drugs.  Family History: Family History  Problem Relation Age of Onset  . CAD Father 11  . Diabetes Father   . Heart attack Father   . Diabetes Mother   . Diabetes Brother     Physical Exam: Filed Vitals:   02/01/14 2330 02/02/14 0115 02/02/14 0200 02/02/14 0230  BP: 160/111  143/89 137/95  Pulse: 120 94 115 98  Temp:      TempSrc:      Resp: 20 16 16 19   Height:      Weight:      SpO2: 97% 96% 96% 97%   General appearance: alert, cooperative and no distress Head: Normocephalic, without obvious abnormality, atraumatic Eyes: negative Nose: Nares normal. Septum midline. Mucosa normal. No drainage or sinus tenderness. Neck: no JVD and supple, symmetrical, trachea midline Lungs: clear to auscultation bilaterally Heart: regular rate and rhythm, S1, S2 normal, no murmur, click, rub or gallop Abdomen: soft, non-tender; bowel sounds normal; no masses,  no organomegaly Extremities: extremities normal, atraumatic, no cyanosis or edema Pulses: 2+ and symmetric Skin: Skin color, texture, turgor normal. No rashes or lesions Neurologic: Grossly normal   Labs on  Admission:   Recent Labs  02/01/14 2145  NA 144  K 3.7  CL 105  CO2 27  GLUCOSE 164*  BUN 14  CREATININE 1.25  CALCIUM 9.0    Recent Labs  02/01/14 2145  AST 26  ALT 25  ALKPHOS 54  BILITOT 0.6  PROT 6.8  ALBUMIN 3.7    Recent Labs  02/01/14 2145  WBC 7.5  NEUTROABS 5.4  HGB 13.0  HCT 39.1  MCV 93.8  PLT 264    Recent Labs  02/01/14 2145  TROPONINI <0.30   Radiological Exams on Admission: Dg Chest 1 View  02/01/2014   CLINICAL DATA:  Increased  heart rate at home and 8 lbs weight gain today. Pt is s/p cardioversion @ [facility] 01/12/2014. Pt c/o chest tightness and Rt lower extremity pain. Hx A-fib, sleep apnea, DVT, GERD, asthma, HTN, former smoker, cardioversion: 08/25/12, 09/06/12, 03/23/13, 01/12/14  EXAM: CHEST - 1 VIEW  COMPARISON:  DG CHEST 2 VIEW dated 08/05/2012  FINDINGS: There is no focal parenchymal opacity, pleural effusion, or pneumothorax. There is stable cardiomegaly.  The osseous structures are unremarkable.  IMPRESSION: No active disease.   Electronically Signed   By: Kathreen Devoid   On: 02/01/2014 22:33    Assessment/Plan  48 yo male with recurrent afib with rvr and extensive cardiac arrythmia history with multiple cardioversions and ablation  Principal Problem:   Atrial fibrillation with RVR-  Cardiology has been called.  One of their EP physicians will be here tomorrow per edp discussion.  Place in stepdown on dilt gtt.  Rate is better now and below 100 and he is symptomatically feeling better.  Further w/u per cardiology team.  Keep npo in case procedure needed tomorrow.  Cont xaralto.  Active Problems:  Stable unless o/w noted   Morbid obesity   Secondary cardiomyopathy   Essential hypertension, benign   WPW (Wolff-Parkinson-White syndrome)   Obstructive sleep apnea-  May be contributing factor, not using cpap due to uncontrolled GERD  Admit to stepdown solely due to cardizem gtt.  Full code.    Mayci Haning A 02/02/2014, 3:22 AM

## 2014-02-02 NOTE — Progress Notes (Signed)
UR chart review completed.  

## 2014-02-03 DIAGNOSIS — I517 Cardiomegaly: Secondary | ICD-10-CM

## 2014-02-03 LAB — CBC WITH DIFFERENTIAL/PLATELET
Basophils Absolute: 0 10*3/uL (ref 0.0–0.1)
Basophils Relative: 0 % (ref 0–1)
Eosinophils Absolute: 0.1 10*3/uL (ref 0.0–0.7)
Eosinophils Relative: 1 % (ref 0–5)
HEMATOCRIT: 40.8 % (ref 39.0–52.0)
Hemoglobin: 13.7 g/dL (ref 13.0–17.0)
LYMPHS ABS: 1.9 10*3/uL (ref 0.7–4.0)
Lymphocytes Relative: 25 % (ref 12–46)
MCH: 31.2 pg (ref 26.0–34.0)
MCHC: 33.6 g/dL (ref 30.0–36.0)
MCV: 92.9 fL (ref 78.0–100.0)
MONO ABS: 0.6 10*3/uL (ref 0.1–1.0)
MONOS PCT: 8 % (ref 3–12)
NEUTROS ABS: 5 10*3/uL (ref 1.7–7.7)
NEUTROS PCT: 66 % (ref 43–77)
Platelets: 285 10*3/uL (ref 150–400)
RBC: 4.39 MIL/uL (ref 4.22–5.81)
RDW: 12.9 % (ref 11.5–15.5)
WBC: 7.6 10*3/uL (ref 4.0–10.5)

## 2014-02-03 LAB — BASIC METABOLIC PANEL
Anion gap: 12 (ref 5–15)
BUN: 18 mg/dL (ref 6–23)
CO2: 29 meq/L (ref 19–32)
Calcium: 8.9 mg/dL (ref 8.4–10.5)
Chloride: 101 mEq/L (ref 96–112)
Creatinine, Ser: 1.31 mg/dL (ref 0.50–1.35)
GFR calc non Af Amer: 62 mL/min — ABNORMAL LOW (ref >90)
GFR, EST AFRICAN AMERICAN: 72 mL/min — AB (ref >90)
Glucose, Bld: 118 mg/dL — ABNORMAL HIGH (ref 70–99)
POTASSIUM: 3.9 meq/L (ref 3.7–5.3)
Sodium: 142 mEq/L (ref 137–147)

## 2014-02-03 MED ORDER — FUROSEMIDE 40 MG PO TABS
40.0000 mg | ORAL_TABLET | Freq: Two times a day (BID) | ORAL | Status: DC
  Administered 2014-02-03: 40 mg via ORAL
  Filled 2014-02-03: qty 1

## 2014-02-03 MED ORDER — METOPROLOL SUCCINATE ER 25 MG PO TB24: 25.0000 mg | ORAL_TABLET | Freq: Every day | ORAL | Status: DC

## 2014-02-03 MED ORDER — HYDROCHLOROTHIAZIDE 12.5 MG PO TABS: 12.5000 mg | ORAL_TABLET | Freq: Every day | ORAL | Status: DC

## 2014-02-03 MED ORDER — FUROSEMIDE 40 MG PO TABS: 40.0000 mg | ORAL_TABLET | Freq: Two times a day (BID) | ORAL | Status: DC

## 2014-02-03 NOTE — Progress Notes (Signed)
Pt ambulated around the nursing station twice with no difficulty. Heart rate remained in afib rate 90's-104 while ambulating. Assisted pt back to bed. Will continue to monitor.

## 2014-02-03 NOTE — Progress Notes (Signed)
Echocardiogram 2D Echocardiogram has been performed.  Austin Woods 02/03/2014, 1:17 PM

## 2014-02-03 NOTE — Discharge Summary (Signed)
Physician Discharge Summary  Austin Woods IWP:[id number] DOB: [birth year] DOA: 02/01/2014  PCP: [name], MD  Admit date: 02/01/2014 Discharge date: 02/03/2014  Time spent: 40 minutes  Recommendations for Outpatient Follow-up:  1. bmet 1 week 2. Re-eval EP clinic 02/13/14 3. Lasix added this admit, cut back HCTZ.  Will need further op tiration 4. Need alternative options for pain as NSAIDs could have caused HF  Discharge Diagnoses:  Principal Problem:   Atrial fibrillation with RVR Active Problems:   Morbid obesity   Secondary cardiomyopathy   Essential hypertension, benign   WPW (Wolff-Parkinson-White syndrome)   Obstructive sleep apnea   Acute on chronic systolic CHF (congestive heart failure)   Discharge Condition: good  Diet recommendation: heart healhty low salt  Filed Weights   02/01/14 2125 02/02/14 0330  Weight: 134.718 kg (297 lb) 130.1 kg (286 lb 13.1 oz)    History of present illness:  48 y/o ?, WPW syndrome as/p RAF first in 2001 + Afib CHad2Vasc2 score=3 on Xarelto, Tachycardia mediated CM, Pulm Htn on Tadalfil, Htn, COPD. He has had multiple attempts at Cardioversion 5/14,09/06/12, been trialed on Tikosyn then amiodarone and is on Xarelto for [facility]. SOme documantation that he has traaled Flecainide and Dofetilide and failed He is noted to have been in sinus for some period of time between 6/14-9/14. He had a catheter ablation 03/23/13. He also had a recent cardioversion 01/12/14 and converted to NSR with 2 shocks  Admitted overnight with progressive SOB and found to be in Afib adn started on Cardzem Gtt.  Cardiology consulted-they recommended ECHO as he had some CP and added Metoprolol to his meds and eventually Dilt gtt was d/c.  It was thought his exacerbation of AFib was non-compliance on OSa 2/2 to severe GERd He was noted to be in Acute likely diastolic HF which could also be from irritation 2/2 to NSAIds and being on Xarelto Echo Ef 50-55% was indeterminate  for diastolic parameter but no WM anomally D/c on lasix 40 bid and decreased HCTZ dose Needs close f/u cardiology-already scheudled 02/13/14,    Hospital Course:  Feels better, output about 3.5 liters overnight  No further SOB, No further CP  No n/v  Still has back pain from chronic issues.  HR's on tele in the 80-90 range at rest  Needs lasix po bid 40 mg till seen by pcp        Consultations:  Cadiology  Discharge Exam: Filed Vitals:   02/03/14 1400  BP:   Pulse: 102  Temp:   Resp: 16     Discharge Instructions You were cared for by a hospitalist during your hospital stay. If you have any questions about your discharge medications or the care you received while you were in the hospital after you are discharged, you can call the unit and asked to speak with the hospitalist on call if the hospitalist that took care of you is not available. Once you are discharged, your primary care physician will handle any further medical issues. Please note that NO REFILLS for any discharge medications will be authorized once you are discharged, as it is imperative that you return to your primary care physician (or establish a relationship with a primary care physician if you do not have one) for your aftercare needs so that they can reassess your need for medications and monitor your lab values.  Discharge Instructions   Diet - low sodium heart healthy    Complete by:  As directed  Discharge instructions    Complete by:  As directed   Start lasix 2 x a day Your HCTZ dose has been cut in half Get labs at heart docs office on 02/13/14 when you go to your appt. Make sure you try to cut back on your Mobic and Ibuprofen-these can worsen heart failure and you may need different options for your pain control     Increase activity slowly    Complete by:  As directed           Current Discharge Medication List    START taking these medications   Details  furosemide (LASIX) 40 MG  tablet Take 1 tablet (40 mg total) by mouth 2 (two) times daily. Qty: 60 tablet, Refills: 0    metoprolol succinate (TOPROL-XL) 25 MG 24 hr tablet Take 1 tablet (25 mg total) by mouth daily. Qty: 30 tablet, Refills: 0      CONTINUE these medications which have CHANGED   Details  hydrochlorothiazide (HYDRODIURIL) 12.5 MG tablet Take 1 tablet (12.5 mg total) by mouth daily. Qty: 30 tablet, Refills: 0      CONTINUE these medications which have NOT CHANGED   Details  albuterol (PROVENTIL HFA) 108 (90 BASE) MCG/ACT inhaler Inhale 2 puffs into the lungs every 4 (four) hours as needed for wheezing or shortness of breath.    amiodarone (PACERONE) 200 MG tablet Take 1 tablet (200 mg total) by mouth daily. Qty: 30 tablet, Refills: 6    atorvastatin (LIPITOR) 10 MG tablet Take 10 mg by mouth daily.    azelastine (ASTELIN) 137 MCG/SPRAY nasal spray Place 1 spray into both nostrils 2 (two) times daily.     Cholecalciferol (VITAMIN D) 2000 UNITS CAPS Take 1 capsule by mouth daily.     enalapril (VASOTEC) 20 MG tablet Take 1 tablet (20 mg total) by mouth daily. Qty: 30 tablet, Refills: 6    Fluticasone-Salmeterol (ADVAIR DISKUS) 250-50 MCG/DOSE AEPB Inhale 1 puff into the lungs every 12 (twelve) hours.    omeprazole (PRILOSEC) 20 MG capsule Take 20 mg by mouth daily.    oxybutynin (DITROPAN) 5 MG tablet Take 1 tablet by mouth 2 (two) times daily as needed for bladder spasms.     rivaroxaban (XARELTO) 20 MG TABS tablet Take 20 mg by mouth daily with supper.    meloxicam (MOBIC) 15 MG tablet Take 15 mg by mouth daily as needed for pain.     mometasone-formoterol (DULERA) 100-5 MCG/ACT AERO Inhale 2 puffs into the lungs 2 (two) times daily.    tadalafil (CIALIS) 5 MG tablet Take 5 mg by mouth daily as needed for erectile dysfunction.       No Known Allergies    The results of significant diagnostics from this hospitalization (including imaging, microbiology, ancillary and laboratory)  are listed below for reference.    Significant Diagnostic Studies: Dg Chest 1 View  02/01/2014   CLINICAL DATA:  Increased heart rate at home and 8 lbs weight gain today. Pt is s/p cardioversion @ [facility] 01/12/2014. Pt c/o chest tightness and Rt lower extremity pain. Hx A-fib, sleep apnea, DVT, GERD, asthma, HTN, former smoker, cardioversion: 08/25/12, 09/06/12, 03/23/13, 01/12/14  EXAM: CHEST - 1 VIEW  COMPARISON:  DG CHEST 2 VIEW dated 08/05/2012  FINDINGS: There is no focal parenchymal opacity, pleural effusion, or pneumothorax. There is stable cardiomegaly.  The osseous structures are unremarkable.  IMPRESSION: No active disease.   Electronically Signed   By: Kathreen Devoid   On: 02/01/2014  22:33    Microbiology: Recent Results (from the past 240 hour(s))  MRSA PCR SCREENING     Status: None   Collection Time    02/02/14  3:30 AM      Result Value Ref Range Status   MRSA by PCR NEGATIVE  NEGATIVE Final   Comment:            The GeneXpert MRSA Assay (FDA     approved for NASAL specimens     only), is one component of a     comprehensive MRSA colonization     surveillance program. It is not     intended to diagnose MRSA     infection nor to guide or     monitor treatment for     MRSA infections.     Labs: Basic Metabolic Panel:  Recent Labs Lab 02/01/14 2145 02/02/14 0554 02/02/14 1212 02/03/14 0539  NA 144 145  --  142  K 3.7 3.7  --  3.9  CL 105 103  --  101  CO2 27 29  --  29  GLUCOSE 164* 118*  --  118*  BUN 14 14  --  18  CREATININE 1.25 1.32  --  1.31  CALCIUM 9.0 9.1  --  8.9  MG  --   --  2.1  --    Liver Function Tests:  Recent Labs Lab 02/01/14 2145  AST 26  ALT 25  ALKPHOS 54  BILITOT 0.6  PROT 6.8  ALBUMIN 3.7   No results found for this basename: LIPASE, AMYLASE,  in the last 168 hours No results found for this basename: AMMONIA,  in the last 168 hours CBC:  Recent Labs Lab 02/01/14 2145 02/02/14 0554 02/03/14 0539  WBC 7.5 8.9 7.6  NEUTROABS  5.4  --  5.0  HGB 13.0 13.2 13.7  HCT 39.1 39.8 40.8  MCV 93.8 93.0 92.9  PLT 264 289 285   Cardiac Enzymes:  Recent Labs Lab 02/01/14 2145  TROPONINI <0.30   BNP: BNP (last 3 results)  Recent Labs  02/01/14 2353  PROBNP 407.9*   CBG: No results found for this basename: GLUCAP,  in the last 168 hours     Signed:  Nita Sells  [facility] 02/03/2014, 2:41 PM

## 2014-02-03 NOTE — Progress Notes (Signed)
Pt a/o.vss. Saline lock removed. Up ad lib. No complaints of any distress. Discharge instructions given. Pt verbalized understanding of instructions. Pt left floor ambulatory with family and nursing staff.

## 2014-02-05 ENCOUNTER — Encounter: Payer: Self-pay | Admitting: Family Medicine

## 2014-02-05 ENCOUNTER — Telehealth: Payer: Self-pay | Admitting: Internal Medicine

## 2014-02-05 ENCOUNTER — Ambulatory Visit (INDEPENDENT_AMBULATORY_CARE_PROVIDER_SITE_OTHER): Payer: BC Managed Care – PPO | Admitting: Family Medicine

## 2014-02-05 VITALS — BP 130/90 | Ht 73.0 in | Wt 286.0 lb

## 2014-02-05 DIAGNOSIS — R000 Tachycardia, unspecified: Secondary | ICD-10-CM

## 2014-02-05 NOTE — Telephone Encounter (Signed)
Austin Palau, NP called and spoke with him and he is going to obtain a note from his PCP

## 2014-02-05 NOTE — Telephone Encounter (Signed)
New Message       Pt calling stating he has questions in regards to when he is allowed to go back to work. Please call pt back to advise.

## 2014-02-05 NOTE — Patient Instructions (Addendum)
Double up your metoprolol to two 25 mg tabs daily, can take it at the same time since long acting  Decrease lasix 40 mg to one each morning starting tomorrow

## 2014-02-05 NOTE — Progress Notes (Signed)
   Subjective:    Patient ID: Austin Woods, male    DOB: [birth year], 48 y.o.   MRN: [id number]  HPI Patient went to the ED on 10/29 for A-Fib. He was released on Saturday.   Today, he c/o headache, dizziness  He would like to know when he could go back to work.   Patient notes when he stands up [place] does not get significant lightheadedness.  Was seen in the hospital. Developed an element of fluid overload. Had an echocardiogram which revealed good ejection fraction.  Noting some exertional challenges. Feels short of breath and tired when trying to exert himself.  Currently managed by the cardiologist for atrial fibrillation. Notes rapid heart rate still.  Hospital records reviewed.  Of note patient was on hydrochlorothiazide now on high-dose Lasix 40 twice a day  Review of Systems No current chest pain no headache no back pain no abdominal pain no change in bowel habits    Objective:   Physical Exam  Alert no acute distress blood pressure still elevated on repeat 134/92. Tachycardic. HEENT normal. Lungs clear. Heart irregular rhythm and somewhat rapid.  EKG atrial fibrillation with rapid response heart rate 1:15  Ankles without any edema.    Assessment & Plan:  Impression 1 A. Fib suboptimal #2 hypertension suboptimal #3 fluid overload resolved with high Lasix dose plan decrease Lasix to one daily. Already scheduled for blood work in one week. Follow-up with cardiologist. Double dose of metoprolol rationale discussed. WSL

## 2014-02-13 ENCOUNTER — Encounter (HOSPITAL_COMMUNITY): Payer: Self-pay | Admitting: *Deleted

## 2014-02-13 ENCOUNTER — Ambulatory Visit (HOSPITAL_COMMUNITY)
Admission: RE | Admit: 2014-02-13 | Discharge: 2014-02-13 | Disposition: A | Discharge status: Home / Self Care | Payer: BC Managed Care – PPO | Source: Ambulatory Visit | Attending: Nurse Practitioner | Admitting: Nurse Practitioner

## 2014-02-13 ENCOUNTER — Encounter (HOSPITAL_COMMUNITY): Payer: Self-pay | Admitting: Nurse Practitioner

## 2014-02-13 VITALS — BP 132/78 | HR 84 | Ht 73.0 in | Wt 287.0 lb

## 2014-02-13 DIAGNOSIS — I480 Paroxysmal atrial fibrillation: Secondary | ICD-10-CM

## 2014-02-13 DIAGNOSIS — I10 Essential (primary) hypertension: Secondary | ICD-10-CM | POA: Insufficient documentation

## 2014-02-13 DIAGNOSIS — K219 Gastro-esophageal reflux disease without esophagitis: Secondary | ICD-10-CM | POA: Insufficient documentation

## 2014-02-13 DIAGNOSIS — I4891 Unspecified atrial fibrillation: Secondary | ICD-10-CM | POA: Insufficient documentation

## 2014-02-13 DIAGNOSIS — I5022 Chronic systolic (congestive) heart failure: Secondary | ICD-10-CM

## 2014-02-13 DIAGNOSIS — G4733 Obstructive sleep apnea (adult) (pediatric): Secondary | ICD-10-CM | POA: Insufficient documentation

## 2014-02-13 DIAGNOSIS — E669 Obesity, unspecified: Secondary | ICD-10-CM | POA: Insufficient documentation

## 2014-02-13 LAB — BASIC METABOLIC PANEL
Anion gap: 14 (ref 5–15)
BUN: 18 mg/dL (ref 6–23)
CO2: 30 mEq/L (ref 19–32)
Calcium: 9.7 mg/dL (ref 8.4–10.5)
Chloride: 100 mEq/L (ref 96–112)
Creatinine, Ser: 1.41 mg/dL — ABNORMAL HIGH (ref 0.50–1.35)
GFR calc Af Amer: 66 mL/min — ABNORMAL LOW (ref >90)
GFR calc non Af Amer: 57 mL/min — ABNORMAL LOW (ref >90)
GLUCOSE: 107 mg/dL — AB (ref 70–99)
POTASSIUM: 4 meq/L (ref 3.7–5.3)
Sodium: 144 mEq/L (ref 137–147)

## 2014-02-13 MED ORDER — METOPROLOL SUCCINATE ER 25 MG PO TB24: ORAL_TABLET | ORAL | Status: DC

## 2014-02-13 NOTE — Patient Instructions (Signed)
Your physician recommends that you schedule a follow-up appointment---will call you with follow up plan   Your physician has recommended you make the following change in your medication:  1) Increase Metoprolol to 50mg a in the am and 25mg  in the pm  Will need to be out of work through [employer]

## 2014-02-13 NOTE — Patient Instructions (Signed)
To Whom It May Concern:  [name] will be out of work until Friday.  Please call with any questions.   Austin Woods

## 2014-02-13 NOTE — Progress Notes (Signed)
PCP:  [name], MD Primary Cardiologist:  Bronson Ing  The patient presents today for routine electrophysiology followup.  On last visit, amiodarone was increased from 100 to 200 mg a day for persistent afib. He was set up for DCCV 10/9 which held pt in SR for less than two weeks. He has h/o failing flecainide, Tikosyn now amiodarone.  States he did not do well with Cardizem in past as well. He had a hospital admission for afib with rvr and had some fluid retention, thought possibly contributed to by NSAIDS. He also saw PCP, 11/2, who found afib with rvr at 115 bpm amd increased metoprolol from 25 to 50 mg a day. He states cannot tolerate face mask for sleep apnea,, but has an appointment 11/18 to further discuss sleep apnea treatment . He also reports significant gerd, with hot water brash in throat several times nightly. Had his esophagus "wrapped" in remote past but does not follow with GI.Marland Kitchen Will ask for referral when returns to pcp in the next 1-2 weeks. Currently cannot return to work with RVR. Last ablation 12/14.  Today, positive for dyspnea, fatigue. No chest pain. Trace pedal edema. Positive for significant gerd symptoms.   Past Medical History  Diagnosis Date  . Hypertension   . High cholesterol   . Asthma   . GERD (gastroesophageal reflux disease)   . History of DVT (deep vein thrombosis)     a. left leg following ablation for SVT  . Wolff-Parkinson-White (WPW) syndrome     a. s/p RFCA 2001 by Dr Caryl Comes, pt reports complicated by post procedure DVT  . Persistent atrial fibrillation     a. Dx 08/2012, xarelto initiated; b. 09/2012 Tikosyn initiated -> DCCV -> Sinus.  . Sleep apnea     does not use CPAP (he reports having uvulectomy)    Past Surgical History  Procedure Laterality Date  . Back surgery    . Tonsillectomy    . Stomach surgery      reflux, fundoplication  . Nose surgery      sleep apnea surgery  . Tee without cardioversion N/A 08/25/2012    Procedure:  TRANSESOPHAGEAL ECHOCARDIOGRAM (TEE);  Surgeon: Thayer Headings, MD;  Location: [place];  Service: Cardiovascular;  Laterality: N/A;  . Cardioversion N/A 08/25/2012    Procedure: CARDIOVERSION;  Surgeon: Thayer Headings, MD;  Location: [facility] ENDOSCOPY;  Service: Cardiovascular;  Laterality: N/A;  . Cardioversion N/A 09/06/2012    Procedure: CARDIOVERSION/Bedside;  Surgeon: Carlena Bjornstad, MD;  Location: [place];  Service: Cardiovascular;  Laterality: N/A;  . Tee without cardioversion  03/23/2013    DR ROSS  . Atrial fibrillation ablation  03/23/2013    PVI by DR Rayann Heman for afib  . Tee without cardioversion N/A 03/23/2013    Procedure: TRANSESOPHAGEAL ECHOCARDIOGRAM (TEE);  Surgeon: Fay Records, MD;  Location: [facility] ENDOSCOPY;  Service: Cardiovascular;  Laterality: N/A;  . Cardioversion N/A 01/12/2014    Procedure: CARDIOVERSION;  Surgeon: Pixie Casino, MD;  Location: [facility] ENDOSCOPY;  Service: Cardiovascular;  Laterality: N/A;  . Cardiac surgery      Current Outpatient Prescriptions  Medication Sig Dispense Refill  . albuterol (PROVENTIL HFA) 108 (90 BASE) MCG/ACT inhaler Inhale 2 puffs into the lungs every 4 (four) hours as needed for wheezing or shortness of breath.    Marland Kitchen amiodarone (PACERONE) 200 MG tablet Take 1 tablet (200 mg total) by mouth daily. 30 tablet 6  . atorvastatin (LIPITOR) 10 MG tablet Take 10 mg by  mouth daily.    Marland Kitchen azelastine (ASTELIN) 137 MCG/SPRAY nasal spray Place 1 spray into both nostrils 2 (two) times daily.     . Cholecalciferol (VITAMIN D) 2000 UNITS CAPS Take 1 capsule by mouth daily.     . enalapril (VASOTEC) 20 MG tablet Take 1 tablet (20 mg total) by mouth daily. 30 tablet 6  . Fluticasone-Salmeterol (ADVAIR DISKUS) 250-50 MCG/DOSE AEPB Inhale 1 puff into the lungs every 12 (twelve) hours.    . furosemide (LASIX) 40 MG tablet Take 1 tablet (40 mg total) by mouth 2 (two) times daily. (Patient taking differently: 40 mg daily. ) 60 tablet 0  . hydrochlorothiazide  (HYDRODIURIL) 12.5 MG tablet Take 1 tablet (12.5 mg total) by mouth daily. 30 tablet 0  . meloxicam (MOBIC) 15 MG tablet Take 15 mg by mouth daily as needed for pain.     . metoprolol succinate (TOPROL-XL) 25 MG 24 hr tablet Take 2 25mg  tablets by mouth in the am and 1 25 mg tablet by mouth in the pm 90 tablet 11  . omeprazole (PRILOSEC) 20 MG capsule Take 20 mg by mouth daily.    Marland Kitchen oxybutynin (DITROPAN) 5 MG tablet Take 1 tablet by mouth 2 (two) times daily as needed for bladder spasms.     . rivaroxaban (XARELTO) 20 MG TABS tablet Take 20 mg by mouth daily with supper.    . tadalafil (CIALIS) 5 MG tablet Take 5 mg by mouth daily as needed for erectile dysfunction.     No current facility-administered medications for this encounter.   ROS- he has headaches chronically, gerd, continues to gain weight, sinus pressure, itchy eyes, all systems are reviewed and negative except as per HPI above  No Known Allergies  History   Social History  . Marital Status: Married    Spouse Name: N/A    Number of Children: 2  . Years of Education: N/A   Occupational History  .  [employer]   Social History Main Topics  . Smoking status: Former Smoker    Quit date: 08/06/2010  . Smokeless tobacco: Never Used  . Alcohol Use: No  . Drug Use: No  . Sexual Activity: Not on file   Other Topics Concern  . Not on file   Social History Narrative   Pt lives in [place] with wife.  Works at [employer] History  Problem Relation Age of Onset  . CAD Father 41  . Diabetes Father   . Heart attack Father   . Diabetes Mother   . Diabetes Brother      Physical Exam: Filed Vitals:   02/13/14 1530  BP: 132/78  Pulse: 84  Height: 6\' 1"  (1.854 m)  Weight: 287 lb (130.182 kg)  SpO2: 97%    GEN- The patient is well appearing, alert and oriented x 3 today.   Head- normocephalic, atraumatic Eyes-  Sclera clear, conjunctiva pink Ears- hearing intact Oropharynx- clear Neck- supple, no JVP Lymph- no  cervical lymphadenopathy Lungs- Clear to ausculation bilaterally, normal work of breathing Heart- irregular rate and rhythm, no murmurs, rubs or gallops, PMI not laterally displaced GI- soft, NT, ND, + BS Extremities- no clubbing, cyanosis, trace edema MS- no significant deformity or atrophy Skin- no rash or lesion Psych- euthymic mood, full affect Neuro- strength and sensation are intact  ekg today reveals afib at 124 bpm.  ECHO-02/03/14 - Left ventricle: The cavity size was normal. Wall thickness was normal. Systolic function was normal.  The estimated ejection fraction was in the range of 55% to 60%. Wall motion was normal; there were no regional wall motion abnormalities. The study is not technically sufficient to allow evaluation of LV diastolic function. - Mitral valve: Valve area by pressure half-time: 1.48 cm^2. - Left atrium: The atrium was mildly dilated.  Assessment and Plan:  1. afib Unfortunately, he failed increase in amiodarone and DCCV. Continue amio at 200 mg for now until plan can be further discussed with Dr. Rayann Heman, possible repeat ablation. Add toprol 25 mg at hs to attempt   better rate control. Continue xarelto  2. HTN Stable No change required today   3. OSA Compliance with CPAP is encouraged. I have recommended that he follow-up with his ENT as scheduled in 1-2 weeks for treatment of sleep apnea.  4. Obesity Body mass index is 37.87 kg/(m^2). Weight loss is strongly encouraged.  5. Significant Jerrye Bushy Discuss with PCP GI referral.  6. Recent use of diuretic Bmet today.  Have put pt out of work until Friday,so I  can further discuss plan with Dr. Rayann Heman.

## 2014-02-14 NOTE — Addendum Note (Signed)
Encounter addended by: Roderic Palau, NP on: 02/14/2014 10:12 AM<BR>     Documentation filed: Message

## 2014-02-16 ENCOUNTER — Other Ambulatory Visit: Payer: Self-pay | Admitting: *Deleted

## 2014-02-16 MED ORDER — METOPROLOL SUCCINATE ER 25 MG PO TB24: ORAL_TABLET | ORAL | Status: DC

## 2014-02-20 ENCOUNTER — Encounter: Payer: Self-pay | Admitting: Family Medicine

## 2014-02-20 ENCOUNTER — Ambulatory Visit (INDEPENDENT_AMBULATORY_CARE_PROVIDER_SITE_OTHER): Payer: BC Managed Care – PPO | Admitting: Family Medicine

## 2014-02-20 VITALS — HR 70 | Ht 73.25 in | Wt 290.0 lb

## 2014-02-20 DIAGNOSIS — Z23 Encounter for immunization: Secondary | ICD-10-CM

## 2014-02-20 DIAGNOSIS — Z0000 Encounter for general adult medical examination without abnormal findings: Secondary | ICD-10-CM

## 2014-02-20 MED ORDER — METOPROLOL SUCCINATE ER 25 MG PO TB24: ORAL_TABLET | ORAL | Status: DC

## 2014-02-20 NOTE — Progress Notes (Signed)
   Subjective:    Patient ID: Austin Woods, male    DOB: 08-Feb-1966, 48 y.o.   MRN: [id number]  HPI The patient comes in today for a wellness visit.    A review of their health history was completed.  A review of medications was also completed.  Any needed refills; no  Eating habits: not health conscious  Falls/  MVA accidents in past few months: none  Regular exercise: no  Specialist pt sees on regular basis: cardiologist for A fib. Dr. Tiana Loft   Preventative health issues were discussed.   Additional concerns: requesting bloodwork for kidney function.  Flu vaccine.   Exercises not much at all, partic since heart thythm issues ensued  Diet not good, eats a lot of junk  No col ca, no prost ca  Flu shot today   Still noting rapid heart rate. Review of Systems  Constitutional: Negative for fever, activity change and appetite change.       Ongoing weight gain in recent years  HENT: Negative for congestion and rhinorrhea.   Eyes: Negative for discharge.  Respiratory: Negative for cough and wheezing.   Cardiovascular: Negative for chest pain.       See present illness  Gastrointestinal: Negative for vomiting, abdominal pain and blood in stool.  Genitourinary: Negative for frequency and difficulty urinating.  Musculoskeletal: Negative for neck pain.  Skin: Negative for rash.  Allergic/Immunologic: Negative for environmental allergies and food allergies.  Neurological: Negative for weakness and headaches.  Psychiatric/Behavioral: Negative for agitation.  All other systems reviewed and are negative.      Objective:   Physical Exam  Constitutional: He appears well-developed and well-nourished.  Morbid obesity present  HENT:  Head: Normocephalic and atraumatic.  Right Ear: External ear normal.  Left Ear: External ear normal.  Nose: Nose normal.  Mouth/Throat: Oropharynx is clear and moist.  Eyes: EOM are normal. Pupils are equal, round, and reactive to  light.  Neck: Normal range of motion. Neck supple. No thyromegaly present.  Cardiovascular: Normal rate, regular rhythm and normal heart sounds.   No murmur heard. Pulmonary/Chest: Effort normal and breath sounds normal. No respiratory distress. He has no wheezes.  Abdominal: Soft. Bowel sounds are normal. He exhibits no distension and no mass. There is no tenderness.  Genitourinary: Prostate normal and penis normal.  Musculoskeletal: Normal range of motion. He exhibits no edema.  Lymphadenopathy:    He has no cervical adenopathy.  Neurological: He is alert. He exhibits normal muscle tone.  Skin: Skin is warm and dry. No erythema.  Psychiatric: He has a normal mood and affect. His behavior is normal. Judgment normal.  Vitals reviewed.         Assessment & Plan:  Impression 1 wellness exam #2 morbid obesity discussed #3 hyperlipidemia status and certain patient states did blood work through the workplace. #4 atrial fibrillation heart rate still suboptimal in mid 90s plan colonoscopy sheet given. Patient encouraged to call and schedule one. Increase metoprolol to 2 tablets twice a day. Flu shot today. Follow-up med 7. Check every 6 months. Diet exercise discussed. WSL

## 2014-02-21 ENCOUNTER — Encounter: Payer: Self-pay | Admitting: Family Medicine

## 2014-02-25 LAB — BASIC METABOLIC PANEL
BUN: 18 mg/dL (ref 6–23)
CHLORIDE: 106 meq/L (ref 96–112)
CO2: 26 meq/L (ref 19–32)
Calcium: 9.3 mg/dL (ref 8.4–10.5)
Creat: 1.5 mg/dL — ABNORMAL HIGH (ref 0.50–1.35)
GLUCOSE: 118 mg/dL — AB (ref 70–99)
POTASSIUM: 4 meq/L (ref 3.5–5.3)
Sodium: 141 mEq/L (ref 135–145)

## 2014-02-28 ENCOUNTER — Encounter: Payer: Self-pay | Admitting: *Deleted

## 2014-02-28 ENCOUNTER — Ambulatory Visit (INDEPENDENT_AMBULATORY_CARE_PROVIDER_SITE_OTHER): Payer: BC Managed Care – PPO | Admitting: Internal Medicine

## 2014-02-28 ENCOUNTER — Encounter: Payer: Self-pay | Admitting: Internal Medicine

## 2014-02-28 VITALS — BP 100/80 | HR 93 | Ht 73.0 in | Wt 293.0 lb

## 2014-02-28 DIAGNOSIS — I4819 Other persistent atrial fibrillation: Secondary | ICD-10-CM

## 2014-02-28 DIAGNOSIS — I481 Persistent atrial fibrillation: Secondary | ICD-10-CM

## 2014-02-28 DIAGNOSIS — G4733 Obstructive sleep apnea (adult) (pediatric): Secondary | ICD-10-CM

## 2014-02-28 DIAGNOSIS — I10 Essential (primary) hypertension: Secondary | ICD-10-CM

## 2014-02-28 MED ORDER — ENALAPRIL MALEATE 20 MG PO TABS: 10.0000 mg | ORAL_TABLET | Freq: Every day | ORAL | Status: DC

## 2014-02-28 NOTE — Patient Instructions (Addendum)
Your physician has recommended that you have an ablation. Catheter ablation is a medical procedure used to treat some cardiac arrhythmias (irregular heartbeats). During catheter ablation, a long, thin, flexible tube is put into a blood vessel in your groin (upper thigh), or neck. This tube is called an ablation catheter. It is then guided to your heart through the blood vessel. Radio frequency waves destroy small areas of heart tissue where abnormal heartbeats may cause an arrhythmia to start. Please see the instruction sheet given to you today.   See instruction sheet for ablation  Your physician has recommended you make the following change in your medication:  1) Decrease Vasotec to 10mg  daily

## 2014-02-28 NOTE — Progress Notes (Signed)
PCP:  LUKING,W S, MD Primary Cardiologist:  Koneswaran  The patient presents today for routine electrophysiology followup.  On last visit, amiodarone was increased from 100 to 200 mg a day for persistent afib. He was set up for DCCV 10/9 which held pt in SR for less than two weeks. He has h/o failing flecainide, Tikosyn now amiodarone.  States he did not do well with Cardizem in past as well. He had a hospital admission for afib with rvr and had some fluid retention, thought possibly contributed to by NSAIDS. He also saw PCP, 11/2, who found afib with rvr at 115 bpm amd increased metoprolol from 25 to 50 mg a day. Last ablation 12/14.   Today, he returns with afib with rate around 100 bpm. Amiodarone was stopped recently, he is unsure where. He is able to work but is really tired at the end of his shift due to being in afib. Has started using CPAP recently and does admit makes him fell better. Has had more issues with LLE. Taking 40  Mg lasix every day. HCTZ was recently stopped due to creatinine of 1.5, had rechecked with PCP Saturday. Has not heard results.    Past Medical History  Diagnosis Date  . Hypertension   . High cholesterol   . Asthma   . GERD (gastroesophageal reflux disease)   . History of DVT (deep vein thrombosis)     a. left leg following ablation for SVT  . Wolff-Parkinson-White (WPW) syndrome     a. s/p RFCA 2001 by Dr Klein, pt reports complicated by post procedure DVT  . Persistent atrial fibrillation     a. Dx 08/2012, xarelto initiated; b. 09/2012 Tikosyn initiated -> DCCV -> Sinus.  . Sleep apnea     does not use CPAP (he reports having uvulectomy)    Past Surgical History  Procedure Laterality Date  . Back surgery    . Tonsillectomy    . Stomach surgery      reflux, fundoplication  . Nose surgery      sleep apnea surgery  . Tee without cardioversion N/A 08/25/2012    Procedure: TRANSESOPHAGEAL ECHOCARDIOGRAM (TEE);  Surgeon: Philip J Nahser, MD;  Location: [facility]  ENDOSCOPY;  Service: Cardiovascular;  Laterality: N/A;  . Cardioversion N/A 08/25/2012    Procedure: CARDIOVERSION;  Surgeon: Philip J Nahser, MD;  Location: [facility] ENDOSCOPY;  Service: Cardiovascular;  Laterality: N/A;  . Cardioversion N/A 09/06/2012    Procedure: CARDIOVERSION/Bedside;  Surgeon: Jeffrey D Katz, MD;  Location: [facility] OR;  Service: Cardiovascular;  Laterality: N/A;  . Tee without cardioversion  03/23/2013    DR ROSS  . Atrial fibrillation ablation  03/23/2013    PVI by DR Ilissa Rosner for afib  . Tee without cardioversion N/A 03/23/2013    Procedure: TRANSESOPHAGEAL ECHOCARDIOGRAM (TEE);  Surgeon: Paula V Ross, MD;  Location: [facility] ENDOSCOPY;  Service: Cardiovascular;  Laterality: N/A;  . Cardioversion N/A 01/12/2014    Procedure: CARDIOVERSION;  Surgeon: Kenneth C. Hilty, MD;  Location: [facility] ENDOSCOPY;  Service: Cardiovascular;  Laterality: N/A;  . Cardiac surgery      Current Outpatient Prescriptions  Medication Sig Dispense Refill  . atorvastatin (LIPITOR) 10 MG tablet Take 10 mg by mouth daily.    . azelastine (ASTELIN) 137 MCG/SPRAY nasal spray Place 1 spray into both nostrils 2 (two) times daily.     . Cholecalciferol (VITAMIN D) 2000 UNITS CAPS Take 1 capsule by mouth daily.     . enalapril (VASOTEC) 20 MG tablet Take   0.5 tablets (10 mg total) by mouth daily. 30 tablet 6  . Fluticasone-Salmeterol (ADVAIR DISKUS) 250-50 MCG/DOSE AEPB Inhale 1 puff into the lungs every 12 (twelve) hours.    . furosemide (LASIX) 40 MG tablet Take 1 tablet (40 mg total) by mouth 2 (two) times daily. (Patient taking differently: 40 mg daily. ) 60 tablet 0  . meloxicam (MOBIC) 15 MG tablet Take 15 mg by mouth daily as needed for pain.     . metoprolol succinate (TOPROL-XL) 25 MG 24 hr tablet Take two tablets BID (Patient taking differently: 100 mg 2 (two) times daily. Take two tablets by mouth twice a day) 120 tablet 5  . omeprazole (PRILOSEC) 20 MG capsule Take 20 mg by mouth daily.    . oxybutynin (DITROPAN)  5 MG tablet Take 1 tablet by mouth 2 (two) times daily as needed for bladder spasms.     . rivaroxaban (XARELTO) 20 MG TABS tablet Take 20 mg by mouth daily with supper.    . tadalafil (CIALIS) 5 MG tablet Take 5 mg by mouth daily as needed for erectile dysfunction.    . albuterol (PROVENTIL HFA) 108 (90 BASE) MCG/ACT inhaler Inhale 2 puffs into the lungs every 4 (four) hours as needed for wheezing or shortness of breath.    . Vitamin D, Ergocalciferol, (DRISDOL) 50000 UNITS CAPS capsule Take 50,000 Units by mouth every 30 (thirty) days.      No current facility-administered medications for this visit.   ROS- he has headaches chronically,dizziness, gerd, continues to gain weight, sinus pressure, itchy eyes, all systems are reviewed and negative except as per HPI above and ROS offic sheet.  No Known Allergies  History   Social History  . Marital Status: Married    Spouse Name: N/A    Number of Children: 2  . Years of Education: N/A   Occupational History  .  [employer]   Social History Main Topics  . Smoking status: Former Smoker    Quit date: 08/06/2010  . Smokeless tobacco: Never Used  . Alcohol Use: No  . Drug Use: No  . Sexual Activity: Not on file   Other Topics Concern  . Not on file   Social History Narrative   Pt lives in [place] with wife.  Works at [employer]     Family History  Problem Relation Age of Onset  . CAD Father 50  . Diabetes Father   . Heart attack Father   . Diabetes Mother   . Diabetes Brother      Physical Exam: Filed Vitals:   02/28/14 1011  BP: 100/80  Pulse: 93  Height: 6' 1" (1.854 m)  Weight: 293 lb (132.904 kg)    GEN- The patient is well appearing, alert and oriented x 3 today.   Head- normocephalic, atraumatic Eyes-  Sclera clear, conjunctiva pink Ears- hearing intact Oropharynx- clear Neck- supple, no JVP Lymph- no cervical lymphadenopathy Lungs- Clear to ausculation bilaterally, normal work of breathing Heart- irregular rate and  rhythm, no murmurs, rubs or gallops, PMI not laterally displaced GI- soft, NT, ND, + BS Extremities- no clubbing, cyanosis, trace edema MS- no significant deformity or atrophy Skin- no rash or lesion Psych- euthymic mood, full affect Neuro- strength and sensation are intact  ekg today reveals afib at 93 bpm.  ECHO-02/03/14 - Left ventricle: The cavity size was normal. Wall thickness was normal. Systolic function was normal. The estimated ejection fraction was in the range of 55% to 60%. Wall motion   was normal; there were no regional wall motion abnormalities. The study is not technically sufficient to allow evaluation of LV diastolic function. - Mitral valve: Valve area by pressure half-time: 1.48 cm^2. - Left atrium: The atrium was mildly dilated.  Assessment and Plan:  1. afib Unfortunately, he failed increase in amiodarone and DCCV. Also failed flecainide and tikosyn.  Therapeutic strategies for afib including medicine and ablation were discussed in detail with the patient today. Risk, benefits, and alternatives to EP study and radiofrequency ablation for afib were also discussed in detail today. These risks include but are not limited to stroke, bleeding, vascular damage, tamponade, perforation, damage to the esophagus, lungs, and other structures, pulmonary vein stenosis, radiation exposure, worsening renal function, and death. The patient understands these risk and wishes to proceed.  We will therefore proceed with catheter ablation December 22.  Continue Toprol 50 mg bid for rate control Continue Xarelto 20 mg bid.  2. HTN with c/o dizziness  BPStable but will decrease ace to 1/2 tab a day for dizziness.  3. OSA Compliance with CPAP is encouraged.  4. Obesity Body mass index is 38.67 kg/(m^2). Weight loss is strongly encouraged.  5. Significant Gerd Discuss with PCP GI referral.  6. Recent use of diuretic with Cr 1.5 on last lab.(hctz stopped) Bmet pending  from PCP If creat climbing may need to decrease diuretic..      

## 2014-03-15 ENCOUNTER — Encounter (HOSPITAL_COMMUNITY): Payer: Self-pay | Admitting: Internal Medicine

## 2014-03-16 ENCOUNTER — Telehealth: Payer: Self-pay

## 2014-03-16 ENCOUNTER — Other Ambulatory Visit (INDEPENDENT_AMBULATORY_CARE_PROVIDER_SITE_OTHER): Payer: BC Managed Care – PPO | Admitting: *Deleted

## 2014-03-16 DIAGNOSIS — I4819 Other persistent atrial fibrillation: Secondary | ICD-10-CM

## 2014-03-16 DIAGNOSIS — I481 Persistent atrial fibrillation: Secondary | ICD-10-CM

## 2014-03-16 LAB — CBC WITH DIFFERENTIAL/PLATELET
Basophils Absolute: 0 10*3/uL (ref 0.0–0.1)
Basophils Relative: 0.6 % (ref 0.0–3.0)
EOS PCT: 1.2 % (ref 0.0–5.0)
Eosinophils Absolute: 0.1 10*3/uL (ref 0.0–0.7)
HCT: 39.8 % (ref 39.0–52.0)
HEMOGLOBIN: 13.3 g/dL (ref 13.0–17.0)
LYMPHS ABS: 1.6 10*3/uL (ref 0.7–4.0)
Lymphocytes Relative: 24.6 % (ref 12.0–46.0)
MCHC: 33.3 g/dL (ref 30.0–36.0)
MCV: 93 fl (ref 78.0–100.0)
MONO ABS: 0.6 10*3/uL (ref 0.1–1.0)
MONOS PCT: 8.4 % (ref 3.0–12.0)
Neutro Abs: 4.4 10*3/uL (ref 1.4–7.7)
Neutrophils Relative %: 65.2 % (ref 43.0–77.0)
Platelets: 244 10*3/uL (ref 150.0–400.0)
RBC: 4.28 Mil/uL (ref 4.22–5.81)
RDW: 13.6 % (ref 11.5–15.5)
WBC: 6.7 10*3/uL (ref 4.0–10.5)

## 2014-03-16 LAB — BASIC METABOLIC PANEL
BUN: 16 mg/dL (ref 6–23)
CHLORIDE: 106 meq/L (ref 96–112)
CO2: 29 mEq/L (ref 19–32)
Calcium: 8.6 mg/dL (ref 8.4–10.5)
Creatinine, Ser: 1.3 mg/dL (ref 0.4–1.5)
GFR: 59.77 mL/min — AB (ref >60.00)
GLUCOSE: 98 mg/dL (ref 70–99)
POTASSIUM: 3.4 meq/L — AB (ref 3.5–5.1)
Sodium: 139 mEq/L (ref 135–145)

## 2014-03-16 MED ORDER — POTASSIUM CHLORIDE ER 20 MEQ PO TBCR: 20.0000 meq | EXTENDED_RELEASE_TABLET | Freq: Every morning | ORAL | Status: DC

## 2014-03-16 NOTE — Telephone Encounter (Signed)
Had to leave patient a voicemail re: low K+. Spoke with DOD Dr. Radford Pax for instructions. Start KDur 20 meq 2 today, then 1 daily thereafter. Rx sent to [facility] in [place]. Scheduled for an ablation Monday.

## 2014-03-26 ENCOUNTER — Encounter (HOSPITAL_COMMUNITY)
Admission: RE | Disposition: A | Discharge status: Home / Self Care | Payer: Self-pay | Source: Ambulatory Visit | Attending: Cardiovascular Disease

## 2014-03-26 ENCOUNTER — Ambulatory Visit (HOSPITAL_COMMUNITY)
Admission: RE | Admit: 2014-03-26 | Discharge: 2014-03-26 | Disposition: A | Discharge status: Home / Self Care | Payer: BC Managed Care – PPO | Source: Ambulatory Visit | Attending: Cardiovascular Disease | Admitting: Cardiovascular Disease

## 2014-03-26 ENCOUNTER — Encounter (HOSPITAL_COMMUNITY): Payer: Self-pay | Admitting: *Deleted

## 2014-03-26 ENCOUNTER — Other Ambulatory Visit: Payer: Self-pay

## 2014-03-26 DIAGNOSIS — K219 Gastro-esophageal reflux disease without esophagitis: Secondary | ICD-10-CM | POA: Diagnosis not present

## 2014-03-26 DIAGNOSIS — I456 Pre-excitation syndrome: Secondary | ICD-10-CM | POA: Insufficient documentation

## 2014-03-26 DIAGNOSIS — G4733 Obstructive sleep apnea (adult) (pediatric): Secondary | ICD-10-CM | POA: Diagnosis not present

## 2014-03-26 DIAGNOSIS — I4891 Unspecified atrial fibrillation: Secondary | ICD-10-CM | POA: Insufficient documentation

## 2014-03-26 DIAGNOSIS — I4819 Other persistent atrial fibrillation: Secondary | ICD-10-CM

## 2014-03-26 DIAGNOSIS — Z6838 Body mass index (BMI) 38.0-38.9, adult: Secondary | ICD-10-CM | POA: Diagnosis not present

## 2014-03-26 DIAGNOSIS — E780 Pure hypercholesterolemia: Secondary | ICD-10-CM | POA: Diagnosis not present

## 2014-03-26 DIAGNOSIS — E669 Obesity, unspecified: Secondary | ICD-10-CM | POA: Insufficient documentation

## 2014-03-26 DIAGNOSIS — Z86718 Personal history of other venous thrombosis and embolism: Secondary | ICD-10-CM | POA: Insufficient documentation

## 2014-03-26 DIAGNOSIS — J45909 Unspecified asthma, uncomplicated: Secondary | ICD-10-CM | POA: Insufficient documentation

## 2014-03-26 DIAGNOSIS — Z87891 Personal history of nicotine dependence: Secondary | ICD-10-CM | POA: Diagnosis not present

## 2014-03-26 DIAGNOSIS — I10 Essential (primary) hypertension: Secondary | ICD-10-CM | POA: Insufficient documentation

## 2014-03-26 DIAGNOSIS — I340 Nonrheumatic mitral (valve) insufficiency: Secondary | ICD-10-CM

## 2014-03-26 HISTORY — PX: TEE WITHOUT CARDIOVERSION: SHX5443

## 2014-03-26 SURGERY — ECHOCARDIOGRAM, TRANSESOPHAGEAL
Anesthesia: Moderate Sedation

## 2014-03-26 MED ORDER — SODIUM CHLORIDE 0.9 % IV SOLN
INTRAVENOUS | Status: DC
  Administered 2014-03-26: 500 mL via INTRAVENOUS

## 2014-03-26 MED ORDER — BUTAMBEN-TETRACAINE-BENZOCAINE 2-2-14 % EX AERO
INHALATION_SPRAY | CUTANEOUS | Status: DC | PRN
  Administered 2014-03-26: 2 via TOPICAL

## 2014-03-26 MED ORDER — MIDAZOLAM HCL 10 MG/2ML IJ SOLN
INTRAMUSCULAR | Status: DC | PRN
  Administered 2014-03-26: 3 mg via INTRAVENOUS
  Administered 2014-03-26: 2 mg via INTRAVENOUS

## 2014-03-26 MED ORDER — FENTANYL CITRATE 0.05 MG/ML IJ SOLN
INTRAMUSCULAR | Status: AC
  Filled 2014-03-26: qty 2

## 2014-03-26 MED ORDER — MIDAZOLAM HCL 5 MG/ML IJ SOLN
INTRAMUSCULAR | Status: AC
  Filled 2014-03-26: qty 2

## 2014-03-26 MED ORDER — FENTANYL CITRATE 0.05 MG/ML IJ SOLN
INTRAMUSCULAR | Status: DC | PRN
  Administered 2014-03-26 (×2): 25 ug via INTRAVENOUS

## 2014-03-26 NOTE — Progress Notes (Signed)
  Echocardiogram Echocardiogram Transesophageal has been performed.  Austin Woods M 03/26/2014, 9:31 AM

## 2014-03-26 NOTE — H&P (View-Only) (Signed)
PCP:  [name], MD Primary Cardiologist:  Bronson Ing  The patient presents today for routine electrophysiology followup.  On last visit, amiodarone was increased from 100 to 200 mg a day for persistent afib. He was set up for DCCV 10/9 which held pt in SR for less than two weeks. He has h/o failing flecainide, Tikosyn now amiodarone.  States he did not do well with Cardizem in past as well. He had a hospital admission for afib with rvr and had some fluid retention, thought possibly contributed to by NSAIDS. He also saw PCP, 11/2, who found afib with rvr at 115 bpm amd increased metoprolol from 25 to 50 mg a day. Last ablation 12/14.   Today, he returns with afib with rate around 100 bpm. Amiodarone was stopped recently, he is unsure where. He is able to work but is really tired at the end of his shift due to being in afib. Has started using CPAP recently and does admit makes him fell better. Has had more issues with LLE. Taking 40  Mg lasix every day. HCTZ was recently stopped due to creatinine of 1.5, had rechecked with PCP Saturday. Has not heard results.    Past Medical History  Diagnosis Date  . Hypertension   . High cholesterol   . Asthma   . GERD (gastroesophageal reflux disease)   . History of DVT (deep vein thrombosis)     a. left leg following ablation for SVT  . Wolff-Parkinson-White (WPW) syndrome     a. s/p RFCA 2001 by Dr Caryl Comes, pt reports complicated by post procedure DVT  . Persistent atrial fibrillation     a. Dx 08/2012, xarelto initiated; b. 09/2012 Tikosyn initiated -> DCCV -> Sinus.  . Sleep apnea     does not use CPAP (he reports having uvulectomy)    Past Surgical History  Procedure Laterality Date  . Back surgery    . Tonsillectomy    . Stomach surgery      reflux, fundoplication  . Nose surgery      sleep apnea surgery  . Tee without cardioversion N/A 08/25/2012    Procedure: TRANSESOPHAGEAL ECHOCARDIOGRAM (TEE);  Surgeon: Thayer Headings, MD;  Location: [facility];  Service: Cardiovascular;  Laterality: N/A;  . Cardioversion N/A 08/25/2012    Procedure: CARDIOVERSION;  Surgeon: Thayer Headings, MD;  Location: [facility] ENDOSCOPY;  Service: Cardiovascular;  Laterality: N/A;  . Cardioversion N/A 09/06/2012    Procedure: CARDIOVERSION/Bedside;  Surgeon: Carlena Bjornstad, MD;  Location: [place];  Service: Cardiovascular;  Laterality: N/A;  . Tee without cardioversion  03/23/2013    DR ROSS  . Atrial fibrillation ablation  03/23/2013    PVI by DR Rayann Heman for afib  . Tee without cardioversion N/A 03/23/2013    Procedure: TRANSESOPHAGEAL ECHOCARDIOGRAM (TEE);  Surgeon: Fay Records, MD;  Location: [facility] ENDOSCOPY;  Service: Cardiovascular;  Laterality: N/A;  . Cardioversion N/A 01/12/2014    Procedure: CARDIOVERSION;  Surgeon: Pixie Casino, MD;  Location: [facility] ENDOSCOPY;  Service: Cardiovascular;  Laterality: N/A;  . Cardiac surgery      Current Outpatient Prescriptions  Medication Sig Dispense Refill  . atorvastatin (LIPITOR) 10 MG tablet Take 10 mg by mouth daily.    Marland Kitchen azelastine (ASTELIN) 137 MCG/SPRAY nasal spray Place 1 spray into both nostrils 2 (two) times daily.     . Cholecalciferol (VITAMIN D) 2000 UNITS CAPS Take 1 capsule by mouth daily.     . enalapril (VASOTEC) 20 MG tablet Take  0.5 tablets (10 mg total) by mouth daily. 30 tablet 6  . Fluticasone-Salmeterol (ADVAIR DISKUS) 250-50 MCG/DOSE AEPB Inhale 1 puff into the lungs every 12 (twelve) hours.    . furosemide (LASIX) 40 MG tablet Take 1 tablet (40 mg total) by mouth 2 (two) times daily. (Patient taking differently: 40 mg daily. ) 60 tablet 0  . meloxicam (MOBIC) 15 MG tablet Take 15 mg by mouth daily as needed for pain.     . metoprolol succinate (TOPROL-XL) 25 MG 24 hr tablet Take two tablets BID (Patient taking differently: 100 mg 2 (two) times daily. Take two tablets by mouth twice a day) 120 tablet 5  . omeprazole (PRILOSEC) 20 MG capsule Take 20 mg by mouth daily.    Marland Kitchen oxybutynin (DITROPAN)  5 MG tablet Take 1 tablet by mouth 2 (two) times daily as needed for bladder spasms.     . rivaroxaban (XARELTO) 20 MG TABS tablet Take 20 mg by mouth daily with supper.    . tadalafil (CIALIS) 5 MG tablet Take 5 mg by mouth daily as needed for erectile dysfunction.    Marland Kitchen albuterol (PROVENTIL HFA) 108 (90 BASE) MCG/ACT inhaler Inhale 2 puffs into the lungs every 4 (four) hours as needed for wheezing or shortness of breath.    . Vitamin D, Ergocalciferol, (DRISDOL) 50000 UNITS CAPS capsule Take 50,000 Units by mouth every 30 (thirty) days.      No current facility-administered medications for this visit.   ROS- he has headaches chronically,dizziness, gerd, continues to gain weight, sinus pressure, itchy eyes, all systems are reviewed and negative except as per HPI above and ROS offic sheet.  No Known Allergies  History   Social History  . Marital Status: Married    Spouse Name: N/A    Number of Children: 2  . Years of Education: N/A   Occupational History  .  [employer]   Social History Main Topics  . Smoking status: Former Smoker    Quit date: 08/06/2010  . Smokeless tobacco: Never Used  . Alcohol Use: No  . Drug Use: No  . Sexual Activity: Not on file   Other Topics Concern  . Not on file   Social History Narrative   Pt lives in [place] with wife.  Works at [employer] History  Problem Relation Age of Onset  . CAD Father 73  . Diabetes Father   . Heart attack Father   . Diabetes Mother   . Diabetes Brother      Physical Exam: Filed Vitals:   02/28/14 1011  BP: 100/80  Pulse: 93  Height: 6\' 1"  (1.854 m)  Weight: 293 lb (132.904 kg)    GEN- The patient is well appearing, alert and oriented x 3 today.   Head- normocephalic, atraumatic Eyes-  Sclera clear, conjunctiva pink Ears- hearing intact Oropharynx- clear Neck- supple, no JVP Lymph- no cervical lymphadenopathy Lungs- Clear to ausculation bilaterally, normal work of breathing Heart- irregular rate and  rhythm, no murmurs, rubs or gallops, PMI not laterally displaced GI- soft, NT, ND, + BS Extremities- no clubbing, cyanosis, trace edema MS- no significant deformity or atrophy Skin- no rash or lesion Psych- euthymic mood, full affect Neuro- strength and sensation are intact  ekg today reveals afib at 93 bpm.  ECHO-02/03/14 - Left ventricle: The cavity size was normal. Wall thickness was normal. Systolic function was normal. The estimated ejection fraction was in the range of 55% to 60%. Wall motion  was normal; there were no regional wall motion abnormalities. The study is not technically sufficient to allow evaluation of LV diastolic function. - Mitral valve: Valve area by pressure half-time: 1.48 cm^2. - Left atrium: The atrium was mildly dilated.  Assessment and Plan:  1. afib Unfortunately, he failed increase in amiodarone and DCCV. Also failed flecainide and tikosyn.  Therapeutic strategies for afib including medicine and ablation were discussed in detail with the patient today. Risk, benefits, and alternatives to EP study and radiofrequency ablation for afib were also discussed in detail today. These risks include but are not limited to stroke, bleeding, vascular damage, tamponade, perforation, damage to the esophagus, lungs, and other structures, pulmonary vein stenosis, radiation exposure, worsening renal function, and death. The patient understands these risk and wishes to proceed.  We will therefore proceed with catheter ablation December 22.  Continue Toprol 50 mg bid for rate control Continue Xarelto 20 mg bid.  2. HTN with c/o dizziness  BPStable but will decrease ace to 1/2 tab a day for dizziness.  3. OSA Compliance with CPAP is encouraged.  4. Obesity Body mass index is 38.67 kg/(m^2). Weight loss is strongly encouraged.  5. Significant Jerrye Bushy Discuss with PCP GI referral.  6. Recent use of diuretic with Cr 1.5 on last lab.(hctz stopped) Bmet pending  from PCP If creat climbing may need to decrease diuretic.Marland Kitchen

## 2014-03-26 NOTE — Interval H&P Note (Signed)
History and Physical Interval Note:  03/26/2014 7:43 AM  Austin Woods  has presented today for surgery, with the diagnosis of afib  The various methods of treatment have been discussed with the patient and family. After consideration of risks, benefits and other options for treatment, the patient has consented to  Procedure(s): TRANSESOPHAGEAL ECHOCARDIOGRAM (TEE) (N/A) as a surgical intervention .  The patient's history has been reviewed, patient examined, no change in status, stable for surgery.  I have reviewed the patient's chart and labs.  Questions were answered to the patient's satisfaction.     Jenkins Rouge

## 2014-03-26 NOTE — CV Procedure (Signed)
TEE:  5mg  versed 50ug fentanyl  See full report in [facility] No LAA thrombus ok for ablation in am  Moderate LAE No ASD EF 60% MIld MR  Jenkins Rouge

## 2014-03-26 NOTE — Discharge Instructions (Signed)
Transesophageal Echocardiogram °Transesophageal echocardiography (TEE) is a special type of test that produces images of the heart by using sound waves (echocardiogram). This type of echocardiography can obtain better images of the heart than standard echocardiography. TEE is done by passing a flexible tube down the esophagus. The heart is located in front of the esophagus. Because the heart and esophagus are close to one another, your health care provider can take very clear, detailed pictures of the heart via ultrasound waves. °TEE may be done: °· If your health care provider needs more information based on standard echocardiography findings. °· If you had a stroke. This might have happened because a clot formed in your heart. TEE can visualize different areas of the heart and check for clots. °· To check valve anatomy and function. °· To check for infection on the inside of your heart (endocarditis). °· To evaluate the dividing wall (septum) of the heart and presence of a hole that did not close after birth (patent foramen ovale or atrial septal defect). °· To help diagnose a tear in the wall of the aorta (aortic dissection). °· During cardiac valve surgery. This allows the surgeon to assess the valve repair before closing the chest. °· During a variety of other cardiac procedures to guide positioning of catheters. °· Sometimes before a cardioversion, which is a shock to convert heart rhythm back to normal. °LET YOUR HEALTH CARE PROVIDER KNOW ABOUT:  °· Any allergies you have. °· All medicines you are taking, including vitamins, herbs, eye drops, creams, and over-the-counter medicines. °· Previous problems you or members of your family have had with the use of anesthetics. °· Any blood disorders you have. °· Previous surgeries you have had. °· Medical conditions you have. °· Swallowing difficulties. °· An esophageal obstruction. °RISKS AND COMPLICATIONS  °Generally, TEE is a safe procedure. However, as with any  procedure, complications can occur. Possible complications include an esophageal tear (rupture). °BEFORE THE PROCEDURE  °· Do not eat or drink for 6 hours before the procedure or as directed by your health care provider. °· Arrange for someone to drive you home after the procedure. Do not drive yourself home. During the procedure, you will be given medicines that can continue to make you feel drowsy and can impair your reflexes. °· An IV access tube will be started in the arm. °PROCEDURE  °· A medicine to help you relax (sedative) will be given through the IV access tube. °· A medicine may be sprayed or gargled to numb the back of the throat. °· Your blood pressure, heart rate, and breathing (vital signs) will be monitored during the procedure. °· The TEE probe is a long, flexible tube. The tip of the probe is placed into the back of the mouth, and you will be asked to swallow. This helps to pass the tip of the probe into the esophagus. Once the tip of the probe is in the correct area, your health care provider can take pictures of the heart. °· TEE is usually not a painful procedure. You may feel the probe press against the back of the throat. The probe does not enter the trachea and does not affect your breathing. °AFTER THE PROCEDURE  °· You will be in bed, resting, until you have fully returned to consciousness. °· When you first awaken, your throat may feel slightly sore and will probably still feel numb. This will improve slowly over time. °· You will not be allowed to eat or drink until it   is clear that the numbness has improved. °· Once you have been able to drink, urinate, and sit on the edge of the bed without feeling sick to your stomach (nausea) or dizzy, you may be cleared to go home. °· You should have a friend or family member with you for the next 24 hours after your procedure. °Document Released: 06/13/2002 Document Revised: 03/28/2013 Document Reviewed: 09/22/2012 °[employer]® Patient Information  ©2015 [employer]. This information is not intended to replace advice given to you by your health care provider. Make sure you discuss any questions you have with your health care provider. ° °

## 2014-03-27 ENCOUNTER — Ambulatory Visit (HOSPITAL_COMMUNITY): Payer: BC Managed Care – PPO | Admitting: Certified Registered"

## 2014-03-27 ENCOUNTER — Ambulatory Visit (HOSPITAL_COMMUNITY)
Admission: RE | Admit: 2014-03-27 | Discharge: 2014-03-28 | Disposition: A | Discharge status: Home / Self Care | Payer: BC Managed Care – PPO | Source: Ambulatory Visit | Attending: Internal Medicine | Admitting: Internal Medicine

## 2014-03-27 ENCOUNTER — Encounter (HOSPITAL_COMMUNITY): Payer: Self-pay | Admitting: Certified Registered"

## 2014-03-27 ENCOUNTER — Encounter (HOSPITAL_COMMUNITY)
Admission: RE | Disposition: A | Discharge status: Home / Self Care | Payer: Self-pay | Source: Ambulatory Visit | Attending: Internal Medicine

## 2014-03-27 DIAGNOSIS — I456 Pre-excitation syndrome: Secondary | ICD-10-CM | POA: Diagnosis not present

## 2014-03-27 DIAGNOSIS — Z86718 Personal history of other venous thrombosis and embolism: Secondary | ICD-10-CM | POA: Insufficient documentation

## 2014-03-27 DIAGNOSIS — Z87891 Personal history of nicotine dependence: Secondary | ICD-10-CM | POA: Insufficient documentation

## 2014-03-27 DIAGNOSIS — E669 Obesity, unspecified: Secondary | ICD-10-CM | POA: Diagnosis not present

## 2014-03-27 DIAGNOSIS — J45909 Unspecified asthma, uncomplicated: Secondary | ICD-10-CM | POA: Diagnosis not present

## 2014-03-27 DIAGNOSIS — I481 Persistent atrial fibrillation: Secondary | ICD-10-CM | POA: Insufficient documentation

## 2014-03-27 DIAGNOSIS — I10 Essential (primary) hypertension: Secondary | ICD-10-CM | POA: Insufficient documentation

## 2014-03-27 DIAGNOSIS — K219 Gastro-esophageal reflux disease without esophagitis: Secondary | ICD-10-CM | POA: Diagnosis not present

## 2014-03-27 DIAGNOSIS — E780 Pure hypercholesterolemia: Secondary | ICD-10-CM | POA: Diagnosis not present

## 2014-03-27 DIAGNOSIS — I429 Cardiomyopathy, unspecified: Secondary | ICD-10-CM

## 2014-03-27 DIAGNOSIS — Z833 Family history of diabetes mellitus: Secondary | ICD-10-CM | POA: Insufficient documentation

## 2014-03-27 DIAGNOSIS — I4891 Unspecified atrial fibrillation: Secondary | ICD-10-CM | POA: Diagnosis present

## 2014-03-27 DIAGNOSIS — Z6838 Body mass index (BMI) 38.0-38.9, adult: Secondary | ICD-10-CM | POA: Insufficient documentation

## 2014-03-27 DIAGNOSIS — Z9989 Dependence on other enabling machines and devices: Secondary | ICD-10-CM | POA: Diagnosis not present

## 2014-03-27 DIAGNOSIS — G4733 Obstructive sleep apnea (adult) (pediatric): Secondary | ICD-10-CM | POA: Diagnosis not present

## 2014-03-27 HISTORY — PX: ATRIAL FIBRILLATION ABLATION: SHX5456

## 2014-03-27 LAB — POCT ACTIVATED CLOTTING TIME
ACTIVATED CLOTTING TIME: 282 s
ACTIVATED CLOTTING TIME: 141 s
Activated Clotting Time: 270 seconds
Activated Clotting Time: 380 seconds
Activated Clotting Time: 263 seconds

## 2014-03-27 LAB — POTASSIUM: Potassium: 4.1 mmol/L (ref 3.5–5.1)

## 2014-03-27 LAB — MRSA PCR SCREENING: MRSA by PCR: NEGATIVE

## 2014-03-27 SURGERY — ATRIAL FIBRILLATION ABLATION
Anesthesia: General

## 2014-03-27 MED ORDER — LIDOCAINE HCL (CARDIAC) 20 MG/ML IV SOLN
INTRAVENOUS | Status: DC | PRN
  Administered 2014-03-27: 50 mg via INTRAVENOUS

## 2014-03-27 MED ORDER — HEPARIN SODIUM (PORCINE) 1000 UNIT/ML IJ SOLN
INTRAMUSCULAR | Status: AC
  Filled 2014-03-27: qty 1

## 2014-03-27 MED ORDER — HEPARIN SODIUM (PORCINE) 1000 UNIT/ML IJ SOLN
INTRAMUSCULAR | Status: DC | PRN
  Administered 2014-03-27: 3000 [IU] via INTRAVENOUS
  Administered 2014-03-27: 12000 [IU] via INTRAVENOUS
  Administered 2014-03-27: 2000 [IU] via INTRAVENOUS

## 2014-03-27 MED ORDER — PROPOFOL 10 MG/ML IV BOLUS
INTRAVENOUS | Status: DC | PRN
  Administered 2014-03-27: 180 mg via INTRAVENOUS

## 2014-03-27 MED ORDER — MOMETASONE FURO-FORMOTEROL FUM 100-5 MCG/ACT IN AERO
2.0000 | INHALATION_SPRAY | Freq: Two times a day (BID) | RESPIRATORY_TRACT | Status: DC
  Administered 2014-03-27 – 2014-03-28 (×2): 2 via RESPIRATORY_TRACT
  Filled 2014-03-27: qty 8.8

## 2014-03-27 MED ORDER — POTASSIUM CHLORIDE CRYS ER 20 MEQ PO TBCR
20.0000 meq | EXTENDED_RELEASE_TABLET | Freq: Every day | ORAL | Status: DC
  Administered 2014-03-27 – 2014-03-28 (×2): 20 meq via ORAL
  Filled 2014-03-27 (×2): qty 1

## 2014-03-27 MED ORDER — HYDROCODONE-ACETAMINOPHEN 5-325 MG PO TABS: 1.0000 | ORAL_TABLET | ORAL | Status: DC | PRN

## 2014-03-27 MED ORDER — DOBUTAMINE IN D5W 4-5 MG/ML-% IV SOLN
INTRAVENOUS | Status: AC
  Filled 2014-03-27: qty 250

## 2014-03-27 MED ORDER — FENTANYL CITRATE 0.05 MG/ML IJ SOLN
INTRAMUSCULAR | Status: DC | PRN
  Administered 2014-03-27 (×3): 50 ug via INTRAVENOUS
  Administered 2014-03-27: 25 ug via INTRAVENOUS

## 2014-03-27 MED ORDER — ONDANSETRON HCL 4 MG/2ML IJ SOLN
INTRAMUSCULAR | Status: DC | PRN
  Administered 2014-03-27: 4 mg via INTRAVENOUS

## 2014-03-27 MED ORDER — RIVAROXABAN 20 MG PO TABS
20.0000 mg | ORAL_TABLET | Freq: Every day | ORAL | Status: DC
Start: 2014-03-27 — End: 2014-03-28
  Administered 2014-03-27: 20 mg via ORAL
  Filled 2014-03-27 (×2): qty 1

## 2014-03-27 MED ORDER — ADENOSINE 6 MG/2ML IV SOLN
INTRAVENOUS | Status: AC
  Filled 2014-03-27: qty 16

## 2014-03-27 MED ORDER — ALBUTEROL SULFATE (2.5 MG/3ML) 0.083% IN NEBU: 3.0000 mL | INHALATION_SOLUTION | RESPIRATORY_TRACT | Status: DC | PRN

## 2014-03-27 MED ORDER — PANTOPRAZOLE SODIUM 40 MG PO TBEC
40.0000 mg | DELAYED_RELEASE_TABLET | Freq: Every day | ORAL | Status: DC
  Administered 2014-03-27 – 2014-03-28 (×2): 40 mg via ORAL
  Filled 2014-03-27 (×2): qty 1

## 2014-03-27 MED ORDER — SODIUM CHLORIDE 0.9 % IJ SOLN: 3.0000 mL | INTRAMUSCULAR | Status: DC | PRN

## 2014-03-27 MED ORDER — PROTAMINE SULFATE 10 MG/ML IV SOLN
INTRAVENOUS | Status: DC | PRN
  Administered 2014-03-27 (×3): 10 mg via INTRAVENOUS

## 2014-03-27 MED ORDER — POTASSIUM CHLORIDE ER 20 MEQ PO TBCR: 20.0000 meq | EXTENDED_RELEASE_TABLET | Freq: Every morning | ORAL | Status: DC

## 2014-03-27 MED ORDER — SODIUM CHLORIDE 0.9 % IV SOLN
INTRAVENOUS | Status: DC | PRN
  Administered 2014-03-27: 07:00:00 via INTRAVENOUS

## 2014-03-27 MED ORDER — MIDAZOLAM HCL 5 MG/5ML IJ SOLN
INTRAMUSCULAR | Status: DC | PRN
  Administered 2014-03-27 (×2): 1 mg via INTRAVENOUS
  Administered 2014-03-27: 2 mg via INTRAVENOUS

## 2014-03-27 MED ORDER — AZELASTINE HCL 0.1 % NA SOLN
1.0000 | Freq: Two times a day (BID) | NASAL | Status: DC
  Administered 2014-03-27 – 2014-03-28 (×2): 1 via NASAL
  Filled 2014-03-27: qty 30

## 2014-03-27 MED ORDER — ARTIFICIAL TEARS OP OINT
TOPICAL_OINTMENT | OPHTHALMIC | Status: DC | PRN
  Administered 2014-03-27: 1 via OPHTHALMIC

## 2014-03-27 MED ORDER — SODIUM CHLORIDE 0.9 % IJ SOLN
3.0000 mL | Freq: Two times a day (BID) | INTRAMUSCULAR | Status: DC
  Administered 2014-03-27 (×2): 3 mL via INTRAVENOUS

## 2014-03-27 MED ORDER — DOBUTAMINE IN D5W 4-5 MG/ML-% IV SOLN
INTRAVENOUS | Status: DC | PRN
  Administered 2014-03-27: 20 ug/kg/min via INTRAVENOUS

## 2014-03-27 MED ORDER — OFF THE BEAT BOOK
Freq: Once | Status: AC
  Administered 2014-03-27: 1
  Filled 2014-03-27: qty 1

## 2014-03-27 MED ORDER — ONDANSETRON HCL 4 MG/2ML IJ SOLN: 4.0000 mg | Freq: Four times a day (QID) | INTRAMUSCULAR | Status: DC | PRN

## 2014-03-27 MED ORDER — ACETAMINOPHEN 325 MG PO TABS
650.0000 mg | ORAL_TABLET | ORAL | Status: DC | PRN
  Administered 2014-03-27 – 2014-03-28 (×3): 650 mg via ORAL
  Filled 2014-03-27 (×3): qty 2

## 2014-03-27 MED ORDER — BUPIVACAINE HCL (PF) 0.25 % IJ SOLN
INTRAMUSCULAR | Status: AC
  Filled 2014-03-27: qty 30

## 2014-03-27 MED ORDER — ACETAMINOPHEN 325 MG PO TABS
ORAL_TABLET | ORAL | Status: AC
  Filled 2014-03-27: qty 2

## 2014-03-27 MED ORDER — ENALAPRIL MALEATE 10 MG PO TABS
10.0000 mg | ORAL_TABLET | Freq: Every day | ORAL | Status: DC
  Administered 2014-03-27 – 2014-03-28 (×2): 10 mg via ORAL
  Filled 2014-03-27 (×2): qty 1

## 2014-03-27 MED ORDER — SODIUM CHLORIDE 0.9 % IV SOLN: 250.0000 mL | INTRAVENOUS | Status: DC | PRN

## 2014-03-27 NOTE — Anesthesia Procedure Notes (Signed)
Procedure Name: LMA Insertion Date/Time: 03/27/2014 7:55 AM Performed by: Sampson Si E Pre-anesthesia Checklist: Patient identified, Emergency Drugs available, Suction available and Patient being monitored Patient Re-evaluated:Patient Re-evaluated prior to inductionOxygen Delivery Method: Circle system utilized Preoxygenation: Pre-oxygenation with 100% oxygen Intubation Type: IV induction LMA: LMA with gastric port inserted LMA Size: 5.0 Number of attempts: 1 Placement Confirmation: positive ETCO2 and breath sounds checked- equal and bilateral Tube secured with: Tape Dental Injury: Teeth and Oropharynx as per pre-operative assessment

## 2014-03-27 NOTE — Progress Notes (Signed)
Utilization Review Completed.Donne Anon T12/22/2015

## 2014-03-27 NOTE — Discharge Summary (Addendum)
ELECTROPHYSIOLOGY PROCEDURE DISCHARGE SUMMARY    Patient ID: Austin Woods,  MRN: [id number], DOB/AGE: July 04, 1965 48 y.o.  Admit date: 03/27/2014 Discharge date: 03/28/2014  Primary Care Physician: [name], MD Primary Cardiologist: Bronson Ing Electrophysiologist: Thompson Grayer, MD  Primary Discharge Diagnosis:  Atrial fibrillation  Secondary Discharge Diagnosis:  Obesity Sleep apnea  Procedures This Admission:  1.  Electrophysiology study and radiofrequency catheter ablation on 03/27/14 by Dr Thompson Grayer.  This study demonstrated that the culprit PVs for his Afib were the left superior and inferior pulmonary veins.  Afib terminated with isolation of these veins and then was no longer inducible.  There were no early apparent complications.   Brief HPI: Austin Woods is a 48 y.o. male with a history of persistent atrial fibrillation.  They have failed medical therapy with tikosyn, flecainide, and amiodarone.  He underwent prior afib ablation 12/14.  He did well initially but has developed worsening afib over the past few months.  Risks, benefits, and alternatives to catheter ablation of atrial fibrillation were reviewed with the patient who wished to proceed.  The patient underwent TEE prior to the procedure which demonstrated normal LV function and no LAA thrombus.    Hospital Course:  The patient was admitted and underwent EPS/RFCA of atrial fibrillation with details as outlined above.  They were monitored on telemetry overnight which demonstrated sinus rhythm.  Groin was without complication on the day of discharge.  The patient was examined by Dr Rayann Heman and considered to be stable for discharge.  Wound care and restrictions were reviewed with the patient.  The patient will be seen back by Dr Rayann Heman in 12 weeks for post ablation follow up.  He has typical pleuritic chest pain post procedure which he described as mild.    Discharge Vitals: Blood pressure 137/82, pulse 84,  temperature 98.1 F (36.7 C), temperature source Oral, resp. rate 20, height 6\' 1"  (1.854 m), weight 290 lb (131.543 kg), SpO2 96 %.   Physical Exam: Filed Vitals:   03/27/14 2030 03/27/14 2358 03/28/14 0418 03/28/14 0724  BP:  160/78 138/66 137/82  Pulse:  85 84   Temp:  98.4 F (36.9 C) 98.8 F (37.1 C) 98.1 F (36.7 C)  TempSrc:  Oral Oral Oral  Resp:  17 18 20   Height:      Weight:      SpO2: 100% 96% 96% 96%    GEN- The patient is well appearing, alert and oriented x 3 today.   Head- normocephalic, atraumatic Eyes-  Sclera clear, conjunctiva pink Ears- hearing intact Oropharynx- clear Neck- supple, Lungs- Clear to ausculation bilaterally, normal work of breathing Heart- Regular rate and rhythm, no murmurs, rubs or gallops, PMI not laterally displaced GI- soft, NT, ND, + BS Extremities- no clubbing, cyanosis, or edema, groin is without hematoma/ bruit MS- no significant deformity or atrophy Skin- no rash or lesion Psych- euthymic mood, full affect Neuro- strength and sensation are intact   Labs:   Lab Results  Component Value Date   WBC 6.7 03/16/2014   HGB 13.3 03/16/2014   HCT 39.8 03/16/2014   MCV 93.0 03/16/2014   PLT 244.0 03/16/2014     Recent Labs Lab 03/28/14 0337  NA 140  K 4.3  CL 106  CO2 26  BUN 11  CREATININE 1.32  CALCIUM 8.5  GLUCOSE 124*      Discharge Medications:    Medication List    TAKE these medications  ADVAIR DISKUS 250-50 MCG/DOSE Aepb  Generic drug:  Fluticasone-Salmeterol  Inhale 1 puff into the lungs every 12 (twelve) hours.     atorvastatin 10 MG tablet  Commonly known as:  LIPITOR  Take 10 mg by mouth daily.     azelastine 0.1 % nasal spray  Commonly known as:  ASTELIN  Place 1 spray into both nostrils 2 (two) times daily.     enalapril 20 MG tablet  Commonly known as:  VASOTEC  Take 0.5 tablets (10 mg total) by mouth daily.     furosemide 40 MG tablet  Commonly known as:  LASIX  Take 1 tablet  (40 mg total) by mouth 2 (two) times daily.     meloxicam 15 MG tablet  Commonly known as:  MOBIC  Take 15 mg by mouth daily as needed for pain.     metoprolol succinate 25 MG 24 hr tablet  Commonly known as:  TOPROL-XL  Take two tablets BID     omeprazole 20 MG capsule  Commonly known as:  PRILOSEC  Take 20 mg by mouth daily.     oxybutynin 5 MG tablet  Commonly known as:  DITROPAN  Take 1 tablet by mouth 2 (two) times daily as needed for bladder spasms.     Potassium Chloride ER 20 MEQ Tbcr  Take 20 mEq by mouth every morning. Take 2 tablets on first day, then 1 tab daily thereafter     PROVENTIL HFA 108 (90 BASE) MCG/ACT inhaler  Generic drug:  albuterol  Inhale 2 puffs into the lungs every 4 (four) hours as needed for wheezing or shortness of breath.     rivaroxaban 20 MG Tabs tablet  Commonly known as:  XARELTO  Take 20 mg by mouth daily with supper.     tadalafil 5 MG tablet  Commonly known as:  CIALIS  Take 5 mg by mouth daily as needed for erectile dysfunction.     Vitamin D 2000 UNITS Caps  Take 1 capsule by mouth daily.        Disposition:   Follow-up Information    Follow up with Thompson Grayer, MD On 06/27/2014.   Specialty:  Cardiology   Why:  12 noon   Contact information:   [place] [address] [phone]       Duration of Discharge Encounter: Greater than 30 minutes including physician time.  Army Fossa MD 03/28/2014   Addendum: Unfortunately right before discharge, the patient converted back to AF.  V rates were slightly greater than 100 bpm.  He was felt to be stable for discharge.  He will need to follow-up with Roderic Palau NP if he remains in AF in 1 week

## 2014-03-27 NOTE — Anesthesia Postprocedure Evaluation (Signed)
  Anesthesia Post-op Note  Patient: Austin Woods  Procedure(s) Performed: Procedure(s): ATRIAL FIBRILLATION ABLATION (N/A)  Patient Location: Cath Lab  Anesthesia Type:General  Level of Consciousness: awake, alert  and oriented  Airway and Oxygen Therapy: Patient Spontanous Breathing and Patient connected to nasal cannula oxygen  Post-op Pain: none  Post-op Assessment: Post-op Vital signs reviewed, Patient's Cardiovascular Status Stable, Respiratory Function Stable, Patent Airway and Pain level controlled  Post-op Vital Signs: stable  Last Vitals:  Filed Vitals:   03/27/14 1430  BP: 150/102  Pulse: 88  Temp:   Resp: 17    Complications: No apparent anesthesia complications

## 2014-03-27 NOTE — Interval H&P Note (Signed)
History and Physical Interval Note:  03/27/2014 7:38 AM  Austin Woods  has presented today for surgery, with the diagnosis of afib  The various methods of treatment have been discussed with the patient and family. After consideration of risks, benefits and other options for treatment, the patient has consented to  Procedure(s): ATRIAL FIBRILLATION ABLATION (N/A) as a surgical intervention .  The patient's history has been reviewed, patient examined, no change in status, stable for surgery.  I have reviewed the patient's chart and labs.  Questions were answered to the patient's satisfaction.     Thompson Grayer

## 2014-03-27 NOTE — Progress Notes (Signed)
Site area:right groin a 7 fr, 9 fr, 11 fr venous sheath was rempved  Site Prior to Removal:  Level 0  Pressure Applied For 15 MINUTES    Minutes Beginning at 1145am  Manual:   Yes.    Patient Status During Pull:  stable  Post Pull Groin Site:  Level 0  Post Pull Instructions Given:  Yes.    Post Pull Pulses Present:  Yes.    Dressing Applied:  Yes.    Comments:  VS remain stable during sheath pull.   Pt complain of having some soreness at site.

## 2014-03-27 NOTE — Transfer of Care (Signed)
Immediate Anesthesia Transfer of Care Note  Patient: Austin Woods  Procedure(s) Performed: Procedure(s): ATRIAL FIBRILLATION ABLATION (N/A)  Patient Location: Endoscopy Unit  Anesthesia Type:General  Level of Consciousness: awake  Airway & Oxygen Therapy: Patient Spontanous Breathing and Patient connected to nasal cannula oxygen  Post-op Assessment: Report given to PACU RN  Post vital signs: Reviewed and stable  Complications: No apparent anesthesia complications

## 2014-03-27 NOTE — Anesthesia Preprocedure Evaluation (Addendum)
Anesthesia Evaluation   Patient awake    Reviewed: Allergy & Precautions, H&P , NPO status , Patient's Chart, lab work & pertinent test results  Airway Mallampati: II  TM Distance: >3 FB Neck ROM: Full    Dental  (+) Teeth Intact, Dental Advisory Given   Pulmonary asthma , sleep apnea and Continuous Positive Airway Pressure Ventilation , former smoker,  breath sounds clear to auscultation        Cardiovascular hypertension, Pt. on home beta blockers Rhythm:Irregular Rate:Normal     Neuro/Psych    GI/Hepatic GERD-  Medicated and Controlled,  Endo/Other    Renal/GU      Musculoskeletal   Abdominal   Peds  Hematology   Anesthesia Other Findings   Reproductive/Obstetrics                            Anesthesia Physical Anesthesia Plan  ASA: III  Anesthesia Plan: General   Post-op Pain Management:    Induction: Intravenous  Airway Management Planned: LMA  Additional Equipment:   Intra-op Plan:   Post-operative Plan:   Informed Consent: I have reviewed the patients History and Physical, chart, labs and discussed the procedure including the risks, benefits and alternatives for the proposed anesthesia with the patient or authorized representative who has indicated his/her understanding and acceptance.   Dental advisory given  Plan Discussed with: CRNA and Anesthesiologist  Anesthesia Plan Comments:         Anesthesia Quick Evaluation

## 2014-03-27 NOTE — Op Note (Signed)
SURGEON: Thompson Grayer, MD  PREPROCEDURE DIAGNOSES: 1. Persistent atrial fibrillation.  POSTPROCEDURE DIAGNOSES: 1. Persistent atrial fibrillation.  PROCEDURES: 1. Comprehensive electrophysiologic study. 2. Coronary sinus pacing and recording. 3. Three-dimensional mapping of atrial fibrillation  4. Ablation of atrial fibrillation  5. Intracardiac echocardiography. 6. Transseptal puncture of an intact septum. 7. Arrhythmia induction with pacing with dobutamine infusion 8. 3D rotational angiography  INTRODUCTION: Austin Woods is a 48 y.o. male with a history of persistent atrial fibrillation who now presents for EP study and radiofrequency ablation. The patient reports initially being diagnosed with atrial fibrillation after presenting with symptomatic palpitations and fatgiue. The patient reports increasing frequency and duration of atrial fibrillation since that time. The patient has failed medical therapy with flecainide, tikosyn, and amiodarone.  He underwent atrial fibrillation ablation by me 03/2013.  He did well initially following ablation.  Unfortunately he has developed recurrent symptomatic persistent atrial fibrillation. The patient therefore presents today for catheter ablation of atrial fibrillation.  DESCRIPTION OF PROCEDURE: Informed written consent was obtained, and the patient was brought to the electrophysiology lab in a fasting state. The patient was adequately sedated with intravenous medications as outlined in the anesthesia report. The patient's left and right groins were prepped and draped in the usual sterile fashion by the EP lab staff. Using a percutaneous Seldinger technique, two 7-French and one 11-French hemostasis sheaths were placed into the right common femoral vein.  3 Dimensional Rotational Angiography: A 5 french pigtail catheter was introduced through the right common femoral vein and advanced into the inferior venocava.  3 demential rotational  angiography was then performed by power injection of 100cc of nonionic contrast.  Reprocessing at an independent work station was then performed.   This demonstrated a moderate sized left atrium with 4 separate pulmonary veins which were also moderate in size.  There was a small right middle pulmonary vein.  There were no anomalous veins or significant abnormalities.  A 3 dimensional rendering of the left atrium was then merged using [employer] onto the [employer] system and registered with intracardiac echo (see below).  The pigtail catheter was then removed.  Catheter Placement: A 7-French [employer] Decapolar coronary sinus catheter was introduced through the right common femoral vein and advanced into the coronary sinus for recording and pacing from this location. A 6-French quadripolar catheter was introduced through the right common femoral vein and advanced into the right ventricle for recording and pacing. This catheter was then pulled back to the His bundle location.   Initial Measurements: The patient presented to the electrophysiology lab in atrial fibrillation. His QRS duration was 100 msec and a QT interval of 375 msec. The HV interval measured 38 msec.   Intracardiac Echocardiography: A 10-French [employer] AcuNav intracardiac echocardiography catheter was introduced through the left common femoral vein and advanced into the right atrium. Intracardiac echocardiography was performed of the left atrium, and a three-dimensional anatomical rendering of the left atrium was performed using CARTO sound technology. The patient was noted to have a moderate sized left atrium. The interatrial septum was prominent but not aneurysmal. All 4 pulmonary veins were visualized and noted to have separate ostia. There was a small right middle pulmonary vein.  The pulmonary veins were moderate in size. The left atrial appendage was visualized and did not reveal thrombus.  There was no evidence of pulmonary vein stenosis.   Transseptal Puncture: The middle right common femoral vein sheath was exchanged for an 8.5 [place] SL2  transseptal sheath and transseptal access was achieved in a standard fashion using a [place]rough needle under biplane fluoroscopy with intracardiac echocardiography confirmation of the transseptal puncture. Once transseptal access had been achieved, heparin was administered intravenously and intra- arterially in order to maintain an ACT of greater than 300 seconds throughout the procedure.   3D Mapping and Ablation: The His bundle catheter was removed and in its place a 3.5 mm [employer] Thermocool ablation catheter was advanced into the right atrium. The transseptal sheath was pulled back into the IVC over a guidewire. The ablation catheter was advanced across the transseptal hole using the wire as a guide. The transseptal sheath was then re-advanced over the guidewire into the left atrium. A duodecapolar [employer] Webster circular mapping catheter was introduced through the transseptal sheath and positioned over the mouth of all 4 pulmonary veins. Three-dimensional electroanatomical mapping was performed using CARTO technology. This demonstrated electrical activity within the left superior and inferior pulmonary veins.  Prodigious conduction was noted from the left inferior pulmonary vein.  These two veins were successfully reisolated using radiofrequency current.  Atrial fibrillation converted to sinus rhythm with isolation of the left pulmonary veins (WACA approach used).  Extensive RF was applied using a WACA approach.  There was trivial return of conduction within the right superior and inferior pulmonary veins.  These veins were also re-isolated using radiofrequency current.  Adenosine 12mg  was then given with each pulmonary vein (x4) to evaluate for return of conduction.  There was no return of conduction with adenosine  administration.   Entrance and exit block were confirmed in each pulmonary vein.   Measurements Following Ablation: In sinus rhythm with RR interval was 759 msec, with PR 164 msec, QRS 107 msec, and Qtc 435 msec. Following ablation the AH interval measured 79 msec with an HV interval of 39 msec. Ventricular pacing was performed, which revealed midline decremental VA conduction with a VA Wenckebach cycle length of 370 msec.  Rapid atrial pacing was performed, which revealed an AV Wenckebach cycle length of 350 msec.  Dobutamine was then infused at 20 mcg/kg/min with no inducible arrhythmias observed.  Rapid atrial pacing was performed down to a cycle length of 200 msec with no inducible arrhythmias post ablation.  The procedure was therefore considered completed. All catheters were removed, and the sheaths were aspirated and flushed. The patient was transferred to the recovery area for sheath removal per protocol. A limited bedside transthoracic echocardiogram revealed no pericardial effusion. There were no early apparent complications.  CONCLUSIONS: 1. Atrial fibrillation upon presentation.  2. Rotational angiography reveals a moderate sized left atrium with four separate pulmonary veins without evidence of pulmonary vein stenosis.  There was also a right middle pulmonary vein which was addressed with the right superior pulmonary vein because of its close proximity. 3.  Prodigious conduction was noted from the left inferior pulmonary veinn with additional conduction within the left superior PV.  These two veins were successfully reisolated using radiofrequency current.  Atrial fibrillation converted to sinus rhythm with isolation of the left pulmonary veins (WACA approach used).  Extensive RF was applied using a WACA approach.  There was trivial return of conduction within the right superior and inferior pulmonary veins.  These veins were also re-isolated using radiofrequency current.   Adenosine 12mg  was then given with each pulmonary vein (x4) to evaluate for return of conduction.  There was no return of conduction with adenosine administration.   4. No inducible arrhythmias following ablation  both on and off of dobutamine 5. No early apparent complications.

## 2014-03-27 NOTE — H&P (View-Only) (Signed)
PCP:  [name], MD Primary Cardiologist:  Bronson Ing  The patient presents today for routine electrophysiology followup.  On last visit, amiodarone was increased from 100 to 200 mg a day for persistent afib. He was set up for DCCV 10/9 which held pt in SR for less than two weeks. He has h/o failing flecainide, Tikosyn now amiodarone.  States he did not do well with Cardizem in past as well. He had a hospital admission for afib with rvr and had some fluid retention, thought possibly contributed to by NSAIDS. He also saw PCP, 11/2, who found afib with rvr at 115 bpm amd increased metoprolol from 25 to 50 mg a day. Last ablation 12/14.   Today, he returns with afib with rate around 100 bpm. Amiodarone was stopped recently, he is unsure where. He is able to work but is really tired at the end of his shift due to being in afib. Has started using CPAP recently and does admit makes him fell better. Has had more issues with LLE. Taking 40  Mg lasix every day. HCTZ was recently stopped due to creatinine of 1.5, had rechecked with PCP Saturday. Has not heard results.    Past Medical History  Diagnosis Date  . Hypertension   . High cholesterol   . Asthma   . GERD (gastroesophageal reflux disease)   . History of DVT (deep vein thrombosis)     a. left leg following ablation for SVT  . Wolff-Parkinson-White (WPW) syndrome     a. s/p RFCA 2001 by Dr Caryl Comes, pt reports complicated by post procedure DVT  . Persistent atrial fibrillation     a. Dx 08/2012, xarelto initiated; b. 09/2012 Tikosyn initiated -> DCCV -> Sinus.  . Sleep apnea     does not use CPAP (he reports having uvulectomy)    Past Surgical History  Procedure Laterality Date  . Back surgery    . Tonsillectomy    . Stomach surgery      reflux, fundoplication  . Nose surgery      sleep apnea surgery  . Tee without cardioversion N/A 08/25/2012    Procedure: TRANSESOPHAGEAL ECHOCARDIOGRAM (TEE);  Surgeon: Thayer Headings, MD;  Location: [place];  Service: Cardiovascular;  Laterality: N/A;  . Cardioversion N/A 08/25/2012    Procedure: CARDIOVERSION;  Surgeon: Thayer Headings, MD;  Location: [facility] ENDOSCOPY;  Service: Cardiovascular;  Laterality: N/A;  . Cardioversion N/A 09/06/2012    Procedure: CARDIOVERSION/Bedside;  Surgeon: Carlena Bjornstad, MD;  Location: [place];  Service: Cardiovascular;  Laterality: N/A;  . Tee without cardioversion  03/23/2013    DR ROSS  . Atrial fibrillation ablation  03/23/2013    PVI by DR Rayann Heman for afib  . Tee without cardioversion N/A 03/23/2013    Procedure: TRANSESOPHAGEAL ECHOCARDIOGRAM (TEE);  Surgeon: Fay Records, MD;  Location: [facility] ENDOSCOPY;  Service: Cardiovascular;  Laterality: N/A;  . Cardioversion N/A 01/12/2014    Procedure: CARDIOVERSION;  Surgeon: Pixie Casino, MD;  Location[facility] ENDOSCOPY;  Service: Cardiovascular;  Laterality: N/A;  . Cardiac surgery      Current Outpatient Prescriptions  Medication Sig Dispense Refill  . atorvastatin (LIPITOR) 10 MG tablet Take 10 mg by mouth daily.    Marland Kitchen azelastine (ASTELIN) 137 MCG/SPRAY nasal spray Place 1 spray into both nostrils 2 (two) times daily.     . Cholecalciferol (VITAMIN D) 2000 UNITS CAPS Take 1 capsule by mouth daily.     . enalapril (VASOTEC) 20 MG tablet Take  0.5 tablets (10 mg total) by mouth daily. 30 tablet 6  . Fluticasone-Salmeterol (ADVAIR DISKUS) 250-50 MCG/DOSE AEPB Inhale 1 puff into the lungs every 12 (twelve) hours.    . furosemide (LASIX) 40 MG tablet Take 1 tablet (40 mg total) by mouth 2 (two) times daily. (Patient taking differently: 40 mg daily. ) 60 tablet 0  . meloxicam (MOBIC) 15 MG tablet Take 15 mg by mouth daily as needed for pain.     . metoprolol succinate (TOPROL-XL) 25 MG 24 hr tablet Take two tablets BID (Patient taking differently: 100 mg 2 (two) times daily. Take two tablets by mouth twice a day) 120 tablet 5  . omeprazole (PRILOSEC) 20 MG capsule Take 20 mg by mouth daily.    Marland Kitchen oxybutynin (DITROPAN)  5 MG tablet Take 1 tablet by mouth 2 (two) times daily as needed for bladder spasms.     . rivaroxaban (XARELTO) 20 MG TABS tablet Take 20 mg by mouth daily with supper.    . tadalafil (CIALIS) 5 MG tablet Take 5 mg by mouth daily as needed for erectile dysfunction.    Marland Kitchen albuterol (PROVENTIL HFA) 108 (90 BASE) MCG/ACT inhaler Inhale 2 puffs into the lungs every 4 (four) hours as needed for wheezing or shortness of breath.    . Vitamin D, Ergocalciferol, (DRISDOL) 50000 UNITS CAPS capsule Take 50,000 Units by mouth every 30 (thirty) days.      No current facility-administered medications for this visit.   ROS- he has headaches chronically,dizziness, gerd, continues to gain weight, sinus pressure, itchy eyes, all systems are reviewed and negative except as per HPI above and ROS offic sheet.  No Known Allergies  History   Social History  . Marital Status: Married    Spouse Name: N/A    Number of Children: 2  . Years of Education: N/A   Occupational History  .  [employer]   Social History Main Topics  . Smoking status: Former Smoker    Quit date: 08/06/2010  . Smokeless tobacco: Never Used  . Alcohol Use: No  . Drug Use: No  . Sexual Activity: Not on file   Other Topics Concern  . Not on file   Social History Narrative   Pt lives in [place] with wife.  Works at [employer] History  Problem Relation Age of Onset  . CAD Father 24  . Diabetes Father   . Heart attack Father   . Diabetes Mother   . Diabetes Brother      Physical Exam: Filed Vitals:   02/28/14 1011  BP: 100/80  Pulse: 93  Height: 6\' 1"  (1.854 m)  Weight: 293 lb (132.904 kg)    GEN- The patient is well appearing, alert and oriented x 3 today.   Head- normocephalic, atraumatic Eyes-  Sclera clear, conjunctiva pink Ears- hearing intact Oropharynx- clear Neck- supple, no JVP Lymph- no cervical lymphadenopathy Lungs- Clear to ausculation bilaterally, normal work of breathing Heart- irregular rate and  rhythm, no murmurs, rubs or gallops, PMI not laterally displaced GI- soft, NT, ND, + BS Extremities- no clubbing, cyanosis, trace edema MS- no significant deformity or atrophy Skin- no rash or lesion Psych- euthymic mood, full affect Neuro- strength and sensation are intact  ekg today reveals afib at 93 bpm.  ECHO-02/03/14 - Left ventricle: The cavity size was normal. Wall thickness was normal. Systolic function was normal. The estimated ejection fraction was in the range of 55% to 60%. Wall motion  was normal; there were no regional wall motion abnormalities. The study is not technically sufficient to allow evaluation of LV diastolic function. - Mitral valve: Valve area by pressure half-time: 1.48 cm^2. - Left atrium: The atrium was mildly dilated.  Assessment and Plan:  1. afib Unfortunately, he failed increase in amiodarone and DCCV. Also failed flecainide and tikosyn.  Therapeutic strategies for afib including medicine and ablation were discussed in detail with the patient today. Risk, benefits, and alternatives to EP study and radiofrequency ablation for afib were also discussed in detail today. These risks include but are not limited to stroke, bleeding, vascular damage, tamponade, perforation, damage to the esophagus, lungs, and other structures, pulmonary vein stenosis, radiation exposure, worsening renal function, and death. The patient understands these risk and wishes to proceed.  We will therefore proceed with catheter ablation December 22.  Continue Toprol 50 mg bid for rate control Continue Xarelto 20 mg bid.  2. HTN with c/o dizziness  BPStable but will decrease ace to 1/2 tab a day for dizziness.  3. OSA Compliance with CPAP is encouraged.  4. Obesity Body mass index is 38.67 kg/(m^2). Weight loss is strongly encouraged.  5. Significant Jerrye Bushy Discuss with PCP GI referral.  6. Recent use of diuretic with Cr 1.5 on last lab.(hctz stopped) Bmet pending  from PCP If creat climbing may need to decrease diuretic.Marland Kitchen

## 2014-03-28 DIAGNOSIS — G4733 Obstructive sleep apnea (adult) (pediatric): Secondary | ICD-10-CM

## 2014-03-28 DIAGNOSIS — I481 Persistent atrial fibrillation: Secondary | ICD-10-CM

## 2014-03-28 DIAGNOSIS — I10 Essential (primary) hypertension: Secondary | ICD-10-CM | POA: Diagnosis not present

## 2014-03-28 DIAGNOSIS — E780 Pure hypercholesterolemia: Secondary | ICD-10-CM | POA: Diagnosis not present

## 2014-03-28 DIAGNOSIS — I456 Pre-excitation syndrome: Secondary | ICD-10-CM | POA: Diagnosis not present

## 2014-03-28 LAB — BASIC METABOLIC PANEL
Anion gap: 8 (ref 5–15)
BUN: 11 mg/dL (ref 6–23)
CHLORIDE: 106 meq/L (ref 96–112)
CO2: 26 mmol/L (ref 19–32)
Calcium: 8.5 mg/dL (ref 8.4–10.5)
Creatinine, Ser: 1.32 mg/dL (ref 0.50–1.35)
GFR calc Af Amer: 71 mL/min — ABNORMAL LOW (ref >90)
GFR calc non Af Amer: 61 mL/min — ABNORMAL LOW (ref >90)
Glucose, Bld: 124 mg/dL — ABNORMAL HIGH (ref 70–99)
POTASSIUM: 4.3 mmol/L (ref 3.5–5.1)
SODIUM: 140 mmol/L (ref 135–145)

## 2014-03-28 NOTE — Progress Notes (Signed)
DC delayed due to spouse/ride. Pt wanted to wait for DC instructions and education until spouse at bedside.   1237: DC instructions and education given to patient. Pt given "OFF THE BEAT" booklet. VSS. Controlled afib, MD aware. eICU and CCMT notified of DC home. Pt escorted by guest services via wheelchair to private vehicle driven by spouse. All belongings sent home with patient. Questions answered, no new Rx given.

## 2014-03-28 NOTE — Discharge Instructions (Signed)
No driving for 5 days. No lifting over 5 lbs for 1 week. No sexual activity for 1 week. You may return to work in 7 days. Keep procedure site clean & dry. If you notice increased pain, swelling, bleeding or pus, call/return!  You may shower, but no soaking baths/hot tubs/pools for 1 week.  ° ° °

## 2014-03-28 NOTE — Progress Notes (Signed)
Physician notified: Allred At: 5208  Regarding: FYI HR 100s afib. Continue with discharge? Awaiting return response.   Returned Response at: 1027  Order(s): OK to DC.

## 2014-04-04 ENCOUNTER — Ambulatory Visit (INDEPENDENT_AMBULATORY_CARE_PROVIDER_SITE_OTHER): Payer: BC Managed Care – PPO | Admitting: Nurse Practitioner

## 2014-04-04 ENCOUNTER — Encounter: Payer: Self-pay | Admitting: *Deleted

## 2014-04-04 ENCOUNTER — Encounter: Payer: Self-pay | Admitting: Nurse Practitioner

## 2014-04-04 VITALS — HR 65 | Ht 73.0 in | Wt 289.0 lb

## 2014-04-04 DIAGNOSIS — I4819 Other persistent atrial fibrillation: Secondary | ICD-10-CM

## 2014-04-04 DIAGNOSIS — I481 Persistent atrial fibrillation: Secondary | ICD-10-CM

## 2014-04-04 NOTE — Patient Instructions (Signed)
Your physician recommends that you schedule a follow-up appointment as scheduled with Dr. Laverna Peace to return to work on Monday 04/09/14 with no restrictions

## 2014-04-04 NOTE — Progress Notes (Signed)
PCP:  [name], MD Primary Cardiologist:  Bronson Ing  The patient presents today for routine electrophysiology follow up PVI 12/22. He returned to [place] early with ablation, but was in afib just prior to d/c. Usual meds were continued. He felt like his heart was racing and in afib today while ekg being performed, but it showed SR at 29. He denies any indigestion, dysphagia but does have some mild MS discomfort of left chest, hurts more after lying in one spot. Some soreness of rt groin but improving without any bruising or significant pain. Has not missed any blood thinners.  Today,  has started using CPAP recently and does admit makes him fell better. Less  issues with LLE since PVI. Chest discomfort as above, no dyspnea. Under impression he was in rapid afib, but EKG proved to be SR.    Past Medical History  Diagnosis Date  . Hypertension   . High cholesterol   . Asthma   . GERD (gastroesophageal reflux disease)   . History of DVT (deep vein thrombosis)     a. left leg following ablation for SVT  . Wolff-Parkinson-White (WPW) syndrome     a. s/p RFCA 2001 by Dr Caryl Comes, pt reports complicated by post procedure DVT  . Persistent atrial fibrillation     a. Dx 08/2012, xarelto initiated; b. 09/2012 Tikosyn initiated -> DCCV -> Sinus.  . Sleep apnea     does not use CPAP (he reports having uvulectomy)    Past Surgical History  Procedure Laterality Date  . Back surgery    . Tonsillectomy    . Stomach surgery      reflux, fundoplication  . Nose surgery      sleep apnea surgery  . Tee without cardioversion N/A 08/25/2012    Procedure: TRANSESOPHAGEAL ECHOCARDIOGRAM (TEE);  Surgeon: Thayer Headings, MD;  Location: [place];  Service: Cardiovascular;  Laterality: N/A;  . Cardioversion N/A 08/25/2012    Procedure: CARDIOVERSION;  Surgeon: Thayer Headings, MD;  Location: [facility] ENDOSCOPY;  Service: Cardiovascular;  Laterality: N/A;  . Cardioversion N/A 09/06/2012    Procedure:  CARDIOVERSION/Bedside;  Surgeon: Carlena Bjornstad, MD;  Location: [place];  Service: Cardiovascular;  Laterality: N/A;  . Tee without cardioversion  03/23/2013    DR ROSS  . Atrial fibrillation ablation  03/23/2013    PVI by DR Rayann Heman for afib  . Tee without cardioversion N/A 03/23/2013    Procedure: TRANSESOPHAGEAL ECHOCARDIOGRAM (TEE);  Surgeon: Fay Records, MD;  Location: [facility] ENDOSCOPY;  Service: Cardiovascular;  Laterality: N/A;  . Cardioversion N/A 01/12/2014    Procedure: CARDIOVERSION;  Surgeon: Pixie Casino, MD;  Location: [facility] ENDOSCOPY;  Service: Cardiovascular;  Laterality: N/A;  . Cardiac surgery    . Atrial fibrillation ablation N/A 03/23/2013    Procedure: ATRIAL FIBRILLATION ABLATION;  Surgeon: Coralyn Mark, MD;  Location: [place] CATH LAB;  Service: Cardiovascular;  Laterality: N/A;  . Tee without cardioversion N/A 03/26/2014    Procedure: TRANSESOPHAGEAL ECHOCARDIOGRAM (TEE);  Surgeon: Josue Hector, MD;  Location: [facility] ENDOSCOPY;  Service: Cardiovascular;  Laterality: N/A;  . Atrial fibrillation ablation N/A 03/27/2014    Procedure: ATRIAL FIBRILLATION ABLATION;  Surgeon: Thompson Grayer, MD;  Location: [facility] CATH LAB;  Service: Cardiovascular;  Laterality: N/A;    Current Outpatient Prescriptions  Medication Sig Dispense Refill  . albuterol (PROVENTIL HFA) 108 (90 BASE) MCG/ACT inhaler Inhale 2 puffs into the lungs every 4 (four) hours as needed for wheezing or shortness  of breath.    Marland Kitchen atorvastatin (LIPITOR) 10 MG tablet Take 10 mg by mouth daily.    Marland Kitchen azelastine (ASTELIN) 137 MCG/SPRAY nasal spray Place 1 spray into both nostrils 2 (two) times daily.     . Cholecalciferol (VITAMIN D) 2000 UNITS CAPS Take 1 capsule by mouth daily.     . enalapril (VASOTEC) 20 MG tablet Take 0.5 tablets (10 mg total) by mouth daily. 30 tablet 6  . Fluticasone-Salmeterol (ADVAIR DISKUS) 250-50 MCG/DOSE AEPB Inhale 1 puff into the lungs every 12 (twelve) hours.    . furosemide (LASIX) 40 MG tablet Take  1 tablet (40 mg total) by mouth 2 (two) times daily. (Patient taking differently: 40 mg daily. ) 60 tablet 0  . meloxicam (MOBIC) 15 MG tablet Take 15 mg by mouth daily as needed for pain.     . metoprolol succinate (TOPROL-XL) 25 MG 24 hr tablet Take two tablets BID (Patient taking differently: Take 50 mg by mouth 2 (two) times daily. Take two tablets by mouth twice a day) 120 tablet 5  . omeprazole (PRILOSEC) 20 MG capsule Take 20 mg by mouth daily.    Marland Kitchen oxybutynin (DITROPAN) 5 MG tablet Take 1 tablet by mouth 2 (two) times daily as needed for bladder spasms.     . Potassium Chloride ER 20 MEQ TBCR Take 20 mEq by mouth every morning. Take 2 tablets on first day, then 1 tab daily thereafter 90 tablet 3  . rivaroxaban (XARELTO) 20 MG TABS tablet Take 20 mg by mouth daily with supper.    . tadalafil (CIALIS) 5 MG tablet Take 5 mg by mouth daily as needed for erectile dysfunction.     No current facility-administered medications for this visit.   ROS- he has headaches chronically,dizziness, gerd, continues to gain weight, sinus pressure, itchy eyes, all systems are reviewed and negative except as per HPI above .  No Known Allergies  History   Social History  . Marital Status: Married    Spouse Name: N/A    Number of Children: 2  . Years of Education: N/A   Occupational History  .  [employer]   Social History Main Topics  . Smoking status: Former Smoker    Quit date: 08/06/2010  . Smokeless tobacco: Never Used  . Alcohol Use: No  . Drug Use: No  . Sexual Activity: Not on file   Other Topics Concern  . Not on file   Social History Narrative   Pt lives in [place] with wife.  Works at [employer] History  Problem Relation Age of Onset  . CAD Father 46  . Diabetes Father   . Heart attack Father   . Diabetes Mother   . Diabetes Brother      Physical Exam: Filed Vitals:   04/04/14 1118  Pulse: 65  Height: 6\' 1"  (1.854 m)  Weight: 289 lb (131.09 kg)  SpO2: 98%     GEN- The patient is well appearing, alert and oriented x 3 today.   Head- normocephalic, atraumatic Eyes-  Sclera clear, conjunctiva pink Ears- hearing intact Oropharynx- clear Neck- supple, no JVP Lymph- no cervical lymphadenopathy Lungs- Clear to ausculation bilaterally, normal work of breathing Heart-Regular rate and rhythm, no murmurs, rubs or gallops, PMI not laterally displaced GI- soft, NT, ND, + BS Extremities- no clubbing, cyanosis, trace edema MS- no significant deformity or atrophy Skin- no rash or lesion Psych- euthymic mood, full affect Neuro- strength and sensation are  intact  ekg today reveals sinus rhythm at 68 bpm, QTC 476 ms.Marland Kitchen  ECHO-02/03/14 - Left ventricle: The cavity size was normal. Wall thickness was normal. Systolic function was normal. The estimated ejection fraction was in the range of 55% to 60%. Wall motion was normal; there were no regional wall motion abnormalities. The study is not technically sufficient to allow evaluation of LV diastolic function. - Mitral valve: Valve area by pressure half-time: 1.48 cm^2. - Left atrium: The atrium was mildly dilated.  Assessment and Plan:  1. Persistent Afib/ 2nd PVI 12/22  Has failed  amiodarone , DCCV, flecainide and tikosyn.  In SR today.  Continue Toprol 50 mg bid for rate control Continue Xarelto 20 mg bid.  2. OSA Compliance with CPAP encouraged.  4. Obesity Body mass index is 38.14 kg/(m^2). Weight loss is strongly encouraged.  5. Significant Micah Flesher with PCP GI referral.  Work note to return to work on Monday. F/u with Allred as scheduled in March, sooner if issues.

## 2014-04-08 ENCOUNTER — Encounter (HOSPITAL_COMMUNITY): Payer: Self-pay | Admitting: *Deleted

## 2014-04-16 ENCOUNTER — Telehealth: Payer: Self-pay | Admitting: *Deleted

## 2014-04-16 NOTE — Telephone Encounter (Signed)
Patient requests a refill on furosemide. He stated that he is only taking one daily. Ok to refill with this sig? Please advise. Thanks, MI

## 2014-04-17 NOTE — Telephone Encounter (Signed)
Patient needing refill on Furosemide but he states he's only taking it QD instead of BID as it's written. Please advise.

## 2014-04-18 NOTE — Telephone Encounter (Signed)
Ok to give lasix for QD dosing.

## 2014-04-19 ENCOUNTER — Other Ambulatory Visit: Payer: Self-pay | Admitting: *Deleted

## 2014-04-19 MED ORDER — FUROSEMIDE 40 MG PO TABS: 40.0000 mg | ORAL_TABLET | Freq: Every day | ORAL | Status: DC

## 2014-04-26 ENCOUNTER — Other Ambulatory Visit: Payer: Self-pay | Admitting: Family Medicine

## 2014-06-27 ENCOUNTER — Encounter: Payer: Self-pay | Admitting: Nurse Practitioner

## 2014-06-27 ENCOUNTER — Encounter: Payer: Self-pay | Admitting: Internal Medicine

## 2014-06-27 ENCOUNTER — Ambulatory Visit (INDEPENDENT_AMBULATORY_CARE_PROVIDER_SITE_OTHER): Payer: BLUE CROSS/BLUE SHIELD | Admitting: Internal Medicine

## 2014-06-27 VITALS — BP 138/84 | HR 77 | Ht 73.0 in | Wt 292.0 lb

## 2014-06-27 DIAGNOSIS — I10 Essential (primary) hypertension: Secondary | ICD-10-CM

## 2014-06-27 DIAGNOSIS — G4733 Obstructive sleep apnea (adult) (pediatric): Secondary | ICD-10-CM | POA: Diagnosis not present

## 2014-06-27 DIAGNOSIS — I481 Persistent atrial fibrillation: Secondary | ICD-10-CM

## 2014-06-27 DIAGNOSIS — R002 Palpitations: Secondary | ICD-10-CM

## 2014-06-27 DIAGNOSIS — I4819 Other persistent atrial fibrillation: Secondary | ICD-10-CM

## 2014-06-27 NOTE — Progress Notes (Deleted)
PCP:  [name], MD Primary Cardiologist:  Bronson Ing Electrophysiologist: Dr. Rayann Heman  The patient presents today for routine electrophysiology follow up PVI 12/22. He feels like he has less afib burden since last ablation, but continues to have episodes of irregular heartbeat. Continues on blood thinner without bleeding issues.  Today, using CPAP recently and does admit makes him fell better. Less  issues with LLE since PVI. Chest discomfort as above, no dyspnea. Has sinus allergies this time of year.    Past Medical History  Diagnosis Date  . Hypertension   . High cholesterol   . Asthma   . GERD (gastroesophageal reflux disease)   . History of DVT (deep vein thrombosis)     a. left leg following ablation for SVT  . Wolff-Parkinson-White (WPW) syndrome     a. s/p RFCA 2001 by Dr Caryl Comes, pt reports complicated by post procedure DVT  . Persistent atrial fibrillation     a. Dx 08/2012, xarelto initiated; b. 09/2012 Tikosyn initiated -> DCCV -> Sinus c. PVI 03/2013 d. PVI 03/2014  . Sleep apnea     does not use CPAP (he reports having uvulectomy)    Past Surgical History  Procedure Laterality Date  . Back surgery    . Tonsillectomy    . Stomach surgery      reflux, fundoplication  . Nose surgery      sleep apnea surgery  . Tee without cardioversion N/A 08/25/2012    Procedure: TRANSESOPHAGEAL ECHOCARDIOGRAM (TEE);  Surgeon: Thayer Headings, MD;  Location: [place];  Service: Cardiovascular;  Laterality: N/A;  . Cardioversion N/A 08/25/2012    Procedure: CARDIOVERSION;  Surgeon: Thayer Headings, MD;  Location: [facility] ENDOSCOPY;  Service: Cardiovascular;  Laterality: N/A;  . Cardioversion N/A 09/06/2012    Procedure: CARDIOVERSION/Bedside;  Surgeon: Carlena Bjornstad, MD;  Location: [place];  Service: Cardiovascular;  Laterality: N/A;  . Tee without cardioversion  03/23/2013    DR ROSS  . Atrial fibrillation ablation  03/23/2013    PVI by DR Rayann Heman for afib  . Tee without  cardioversion N/A 03/23/2013    Procedure: TRANSESOPHAGEAL ECHOCARDIOGRAM (TEE);  Surgeon: Fay Records, MD;  Location: [facility] ENDOSCOPY;  Service: Cardiovascular;  Laterality: N/A;  . Cardioversion N/A 01/12/2014    Procedure: CARDIOVERSION;  Surgeon: Pixie Casino, MD;  Location: [facility] ENDOSCOPY;  Service: Cardiovascular;  Laterality: N/A;  . Cardiac surgery    . Atrial fibrillation ablation N/A 03/23/2013    Procedure: ATRIAL FIBRILLATION ABLATION;  Surgeon: Coralyn Mark, MD;  Location: [place] CATH LAB;  Service: Cardiovascular;  Laterality: N/A;  . Tee without cardioversion N/A 03/26/2014    Procedure: TRANSESOPHAGEAL ECHOCARDIOGRAM (TEE);  Surgeon: Josue Hector, MD;  Location: [facility] ENDOSCOPY;  Service: Cardiovascular;  Laterality: N/A;  . Atrial fibrillation ablation N/A 03/27/2014    PVI Dr Rayann Heman    Current Outpatient Prescriptions  Medication Sig Dispense Refill  . albuterol (PROVENTIL HFA) 108 (90 BASE) MCG/ACT inhaler Inhale 2 puffs into the lungs every 4 (four) hours as needed for wheezing or shortness of breath.    Marland Kitchen atorvastatin (LIPITOR) 10 MG tablet Take 10 mg by mouth daily.    Marland Kitchen azelastine (ASTELIN) 137 MCG/SPRAY nasal spray Place 1 spray into both nostrils 2 (two) times daily.     . Cholecalciferol (VITAMIN D) 2000 UNITS CAPS Take 1 capsule by mouth daily.     . enalapril (VASOTEC) 20 MG tablet Take 0.5 tablets (10 mg total) by mouth daily.  30 tablet 6  . Fluticasone-Salmeterol (ADVAIR DISKUS) 250-50 MCG/DOSE AEPB Inhale 1 puff into the lungs every 12 (twelve) hours.    . furosemide (LASIX) 40 MG tablet Take 1 tablet (40 mg total) by mouth daily. 30 tablet 5  . meloxicam (MOBIC) 15 MG tablet Take 15 mg by mouth daily as needed for pain.     . metoprolol succinate (TOPROL-XL) 25 MG 24 hr tablet Take 50 mg by mouth 2 (two) times daily.    Marland Kitchen omeprazole (PRILOSEC) 20 MG capsule Take 20 mg by mouth daily.    Marland Kitchen oxybutynin (DITROPAN) 5 MG tablet Take 1 tablet by mouth 2 (two) times daily  as needed for bladder spasms.     . Potassium Chloride ER 20 MEQ TBCR Take 1 tablet by mouth daily.    . rivaroxaban (XARELTO) 20 MG TABS tablet Take 20 mg by mouth daily with supper.    . tadalafil (CIALIS) 5 MG tablet Take 5 mg by mouth daily as needed for erectile dysfunction.     No current facility-administered medications for this visit.   ROS- he has headaches chronically,dizziness, gerd, continues to gain weight, sinus pressure, itchy eyes, all systems are reviewed and negative except as per HPI above .  No Known Allergies  History   Social History  . Marital Status: Married    Spouse Name: N/A  . Number of Children: 2  . Years of Education: N/A   Occupational History  .  [employer]   Social History Main Topics  . Smoking status: Former Smoker    Quit date: 08/06/2010  . Smokeless tobacco: Never Used  . Alcohol Use: No  . Drug Use: No  . Sexual Activity: Not on file   Other Topics Concern  . Not on file   Social History Narrative   Pt lives in [place] with wife.  Works at [employer] History  Problem Relation Age of Onset  . CAD Father 4  . Diabetes Father   . Heart attack Father   . Transient ischemic attack Father   . Vascular Disease Father   . Diabetes Mother   . Anemia Mother   . Gout Mother   . Diabetes Brother   . Aneurysm Maternal Grandmother   . Stroke Maternal Grandfather   . Diabetes Paternal Grandmother   . Stroke Paternal Grandfather      Physical Exam: Filed Vitals:   06/27/14 1221  BP: 138/84  Pulse: 77  Height: 6\' 1"  (1.854 m)  Weight: 292 lb (132.45 kg)    GEN- The patient is well appearing, alert and oriented x 3 today.   Head- normocephalic, atraumatic Eyes-  Sclera clear, conjunctiva pink Ears- hearing intact Oropharynx- clear Neck- supple, no JVP Lymph- no cervical lymphadenopathy Lungs- Clear to ausculation bilaterally, normal work of breathing Heart-Regular rate and rhythm, no murmurs, rubs or gallops, PMI not  laterally displaced GI- soft, NT, ND, + BS Extremities- no clubbing, cyanosis, trace edema MS- no significant deformity or atrophy Skin- no rash or lesion Psych- euthymic mood, full affect Neuro- strength and sensation are intact  ekg today reveals sinus rhythm at 77 bpm  ECHO-02/03/14 - Left ventricle: The cavity size was normal. Wall thickness was normal. Systolic function was normal. The estimated ejection fraction was in the range of 55% to 60%. Wall motion was normal; there were no regional wall motion abnormalities. The study is not technically sufficient to allow evaluation of LV diastolic function. - Mitral  valve: Valve area by pressure half-time: 1.48 cm^2. - Left atrium: The atrium was mildly dilated.  Assessment and Plan:  1. Persistent Afib/ 2nd PVI 12/22  Has failed  amiodarone , DCCV, flecainide and tikosyn. In SR today Continue Toprol 50 mg bid for rate control Continue Xarelto 20 mg bid for chadsvasc score of 2(htn, unspecified HF) Benefit vrs risk for implantation of LINQ device to better assess afib burden and pt would like to proceed, March 25.  2. OSA Compliance with CPAP encouraged.  4. Obesity Body mass index is 38.53 kg/(m^2). Weight loss is strongly encouraged.  F/U as scheduled after linq

## 2014-06-27 NOTE — Patient Instructions (Signed)
Your physician recommends that you schedule a follow-up appointment in: 7-10 days from 06/29/14 in device clinic for wound check   Please check in at [place] at 6:30 on 06/29/14

## 2014-06-28 DIAGNOSIS — R002 Palpitations: Secondary | ICD-10-CM | POA: Insufficient documentation

## 2014-06-28 NOTE — Progress Notes (Signed)
PCP:  [name], MD Primary Cardiologist:  Bronson Ing Electrophysiologist: Dr. Rayann Heman  The patient presents today for routine electrophysiology follow up PVI 12/22. He continues to have episodes of irregular heartbeat.  He is not sure as to whether this is afib or another arrhythmia.  Continues on blood thinner without bleeding issues.  He is off of amiodarone.  Today, using CPAP recently and does admit makes him fell better. Less  issues with LLE since PVI. Chest discomfort as above, no dyspnea. Has sinus allergies this time of year.    Past Medical History  Diagnosis Date  . Hypertension   . High cholesterol   . Asthma   . GERD (gastroesophageal reflux disease)   . History of DVT (deep vein thrombosis)     a. left leg following ablation for SVT  . Wolff-Parkinson-White (WPW) syndrome     a. s/p RFCA 2001 by Dr Caryl Comes, pt reports complicated by post procedure DVT  . Persistent atrial fibrillation     a. Dx 08/2012, xarelto initiated; b. 09/2012 Tikosyn initiated -> DCCV -> Sinus c. PVI 03/2013 d. PVI 03/2014  . Sleep apnea     does not use CPAP (he reports having uvulectomy)    Past Surgical History  Procedure Laterality Date  . Back surgery    . Tonsillectomy    . Stomach surgery      reflux, fundoplication  . Nose surgery      sleep apnea surgery  . Tee without cardioversion N/A 08/25/2012    Procedure: TRANSESOPHAGEAL ECHOCARDIOGRAM (TEE);  Surgeon: Thayer Headings, MD;  Location: [place];  Service: Cardiovascular;  Laterality: N/A;  . Cardioversion N/A 08/25/2012    Procedure: CARDIOVERSION;  Surgeon: Thayer Headings, MD;  Location: [facility] ENDOSCOPY;  Service: Cardiovascular;  Laterality: N/A;  . Cardioversion N/A 09/06/2012    Procedure: CARDIOVERSION/Bedside;  Surgeon: Carlena Bjornstad, MD;  Location: [place];  Service: Cardiovascular;  Laterality: N/A;  . Tee without cardioversion  03/23/2013    DR ROSS  . Atrial fibrillation ablation  03/23/2013    PVI by DR Rayann Heman for afib   . Tee without cardioversion N/A 03/23/2013    Procedure: TRANSESOPHAGEAL ECHOCARDIOGRAM (TEE);  Surgeon: Fay Records, MD;  Location: [facility] ENDOSCOPY;  Service: Cardiovascular;  Laterality: N/A;  . Cardioversion N/A 01/12/2014    Procedure: CARDIOVERSION;  Surgeon: Pixie Casino, MD;  Location: [facility] ENDOSCOPY;  Service: Cardiovascular;  Laterality: N/A;  . Cardiac surgery    . Atrial fibrillation ablation N/A 03/23/2013    Procedure: ATRIAL FIBRILLATION ABLATION;  Surgeon: Coralyn Mark, MD;  Location: [place] CATH LAB;  Service: Cardiovascular;  Laterality: N/A;  . Tee without cardioversion N/A 03/26/2014    Procedure: TRANSESOPHAGEAL ECHOCARDIOGRAM (TEE);  Surgeon: Josue Hector, MD;  Location: [facility] ENDOSCOPY;  Service: Cardiovascular;  Laterality: N/A;  . Atrial fibrillation ablation N/A 03/27/2014    PVI Dr Rayann Heman    Current Outpatient Prescriptions  Medication Sig Dispense Refill  . albuterol (PROVENTIL HFA) 108 (90 BASE) MCG/ACT inhaler Inhale 2 puffs into the lungs every 4 (four) hours as needed for wheezing or shortness of breath.    Marland Kitchen atorvastatin (LIPITOR) 10 MG tablet Take 10 mg by mouth daily.    Marland Kitchen azelastine (ASTELIN) 137 MCG/SPRAY nasal spray Place 1 spray into both nostrils 2 (two) times daily.     . Cholecalciferol (VITAMIN D) 2000 UNITS CAPS Take 1 capsule by mouth daily.     . enalapril (VASOTEC) 20 MG tablet Take  0.5 tablets (10 mg total) by mouth daily. 30 tablet 6  . Fluticasone-Salmeterol (ADVAIR DISKUS) 250-50 MCG/DOSE AEPB Inhale 1 puff into the lungs every 12 (twelve) hours.    . furosemide (LASIX) 40 MG tablet Take 1 tablet (40 mg total) by mouth daily. 30 tablet 5  . meloxicam (MOBIC) 15 MG tablet Take 15 mg by mouth daily as needed for pain.     . metoprolol succinate (TOPROL-XL) 25 MG 24 hr tablet Take 50 mg by mouth 2 (two) times daily.    Marland Kitchen omeprazole (PRILOSEC) 20 MG capsule Take 20 mg by mouth daily.    Marland Kitchen oxybutynin (DITROPAN) 5 MG tablet Take 1 tablet by mouth 2  (two) times daily as needed for bladder spasms.     . Potassium Chloride ER 20 MEQ TBCR Take 1 tablet by mouth daily.    . rivaroxaban (XARELTO) 20 MG TABS tablet Take 20 mg by mouth daily with supper.    . tadalafil (CIALIS) 5 MG tablet Take 5 mg by mouth daily as needed for erectile dysfunction.     No current facility-administered medications for this visit.   ROS- he has headaches chronically,dizziness, gerd, continues to gain weight, sinus pressure, itchy eyes, all systems are reviewed and negative except as per HPI above .  No Known Allergies  History   Social History  . Marital Status: Married    Spouse Name: N/A  . Number of Children: 2  . Years of Education: N/A   Occupational History  .  [employer]   Social History Main Topics  . Smoking status: Former Smoker    Quit date: 08/06/2010  . Smokeless tobacco: Never Used  . Alcohol Use: No  . Drug Use: No  . Sexual Activity: Not on file   Other Topics Concern  . Not on file   Social History Narrative   Pt lives in [place] with wife.  Works at [employer] History  Problem Relation Age of Onset  . CAD Father 63  . Diabetes Father   . Heart attack Father   . Transient ischemic attack Father   . Vascular Disease Father   . Diabetes Mother   . Anemia Mother   . Gout Mother   . Diabetes Brother   . Aneurysm Maternal Grandmother   . Stroke Maternal Grandfather   . Diabetes Paternal Grandmother   . Stroke Paternal Grandfather      Physical Exam: Filed Vitals:   06/27/14 1221  BP: 138/84  Pulse: 77  Height: 6\' 1"  (1.854 m)  Weight: 292 lb (132.45 kg)    GEN- The patient is well appearing, alert and oriented x 3 today.   Head- normocephalic, atraumatic Eyes-  Sclera clear, conjunctiva pink Ears- hearing intact Oropharynx- clear Neck- supple, no JVP Lymph- no cervical lymphadenopathy Lungs- Clear to ausculation bilaterally, normal work of breathing Heart-Regular rate and rhythm, no murmurs, rubs or  gallops, PMI not laterally displaced GI- soft, NT, ND, + BS Extremities- no clubbing, cyanosis, trace edema MS- no significant deformity or atrophy Skin- no rash or lesion Psych- euthymic mood, full affect Neuro- strength and sensation are intact  ekg today reveals sinus rhythm at 77 bpm  ECHO-02/03/14 - Left ventricle: The cavity size was normal. Wall thickness was normal. Systolic function was normal. The estimated ejection fraction was in the range of 55% to 60%. Wall motion was normal; there were no regional wall motion abnormalities. The study is not technically sufficient to  allow evaluation of LV diastolic function. - Mitral valve: Valve area by pressure half-time: 1.48 cm^2. - Left atrium: The atrium was mildly dilated.  Assessment and Plan:  1. Persistent Afib/ 2nd PVI 12/22  Has failed  amiodarone , DCCV, flecainide and tikosyn. In SR today off of AAD post ablation I am suspicious that his palpitations do not represent afib.  I do think that cardiac monitoring is appropriate to better evaluate his symptoms.  I would recommend implantable loop recorder for evaluation of palpitations/ afib management post ablation. Continue Toprol 50 mg bid for rate control Continue Xarelto 20 mg bid for chadsvasc score of 2(htn, unspecified HF) Benefit vrs risk for implantation of LINQ device to better assess afib burden and pt would like to proceed, March 25.  2. OSA Compliance with CPAP encouraged.  4. Obesity Body mass index is 38.53 kg/(m^2). Weight loss is strongly encouraged.  F/U as scheduled after linq

## 2014-06-29 ENCOUNTER — Encounter (HOSPITAL_COMMUNITY)
Admission: RE | Disposition: A | Discharge status: Home / Self Care | Payer: Self-pay | Source: Ambulatory Visit | Attending: Internal Medicine

## 2014-06-29 ENCOUNTER — Ambulatory Visit (HOSPITAL_COMMUNITY)
Admission: RE | Admit: 2014-06-29 | Discharge: 2014-06-29 | Disposition: A | Discharge status: Home / Self Care | Payer: BLUE CROSS/BLUE SHIELD | Source: Ambulatory Visit | Attending: Internal Medicine | Admitting: Internal Medicine

## 2014-06-29 ENCOUNTER — Encounter (HOSPITAL_COMMUNITY): Payer: Self-pay | Admitting: Internal Medicine

## 2014-06-29 DIAGNOSIS — Z86718 Personal history of other venous thrombosis and embolism: Secondary | ICD-10-CM | POA: Diagnosis not present

## 2014-06-29 DIAGNOSIS — R002 Palpitations: Secondary | ICD-10-CM | POA: Diagnosis present

## 2014-06-29 DIAGNOSIS — I481 Persistent atrial fibrillation: Secondary | ICD-10-CM | POA: Diagnosis not present

## 2014-06-29 DIAGNOSIS — Z87891 Personal history of nicotine dependence: Secondary | ICD-10-CM | POA: Diagnosis not present

## 2014-06-29 DIAGNOSIS — I10 Essential (primary) hypertension: Secondary | ICD-10-CM | POA: Insufficient documentation

## 2014-06-29 DIAGNOSIS — J45909 Unspecified asthma, uncomplicated: Secondary | ICD-10-CM | POA: Diagnosis not present

## 2014-06-29 DIAGNOSIS — E780 Pure hypercholesterolemia: Secondary | ICD-10-CM | POA: Diagnosis not present

## 2014-06-29 DIAGNOSIS — G4733 Obstructive sleep apnea (adult) (pediatric): Secondary | ICD-10-CM | POA: Diagnosis not present

## 2014-06-29 DIAGNOSIS — E669 Obesity, unspecified: Secondary | ICD-10-CM | POA: Insufficient documentation

## 2014-06-29 DIAGNOSIS — Z7901 Long term (current) use of anticoagulants: Secondary | ICD-10-CM | POA: Diagnosis not present

## 2014-06-29 DIAGNOSIS — K219 Gastro-esophageal reflux disease without esophagitis: Secondary | ICD-10-CM | POA: Insufficient documentation

## 2014-06-29 DIAGNOSIS — I4891 Unspecified atrial fibrillation: Secondary | ICD-10-CM | POA: Diagnosis not present

## 2014-06-29 DIAGNOSIS — Z6838 Body mass index (BMI) 38.0-38.9, adult: Secondary | ICD-10-CM | POA: Diagnosis not present

## 2014-06-29 DIAGNOSIS — Z79899 Other long term (current) drug therapy: Secondary | ICD-10-CM | POA: Diagnosis not present

## 2014-06-29 HISTORY — PX: LOOP RECORDER IMPLANT: SHX5477

## 2014-06-29 SURGERY — LOOP RECORDER IMPLANT

## 2014-06-29 MED ORDER — LIDOCAINE-EPINEPHRINE 1 %-1:100000 IJ SOLN
INTRAMUSCULAR | Status: AC
  Filled 2014-06-29: qty 1

## 2014-06-29 NOTE — H&P (View-Only) (Signed)
PCP:  [name], MD Primary Cardiologist:  Bronson Ing Electrophysiologist: Dr. Rayann Heman  The patient presents today for routine electrophysiology follow up PVI 12/22. He continues to have episodes of irregular heartbeat.  He is not sure as to whether this is afib or another arrhythmia.  Continues on blood thinner without bleeding issues.  He is off of amiodarone.  Today, using CPAP recently and does admit makes him fell better. Less  issues with LLE since PVI. Chest discomfort as above, no dyspnea. Has sinus allergies this time of year.    Past Medical History  Diagnosis Date  . Hypertension   . High cholesterol   . Asthma   . GERD (gastroesophageal reflux disease)   . History of DVT (deep vein thrombosis)     a. left leg following ablation for SVT  . Wolff-Parkinson-White (WPW) syndrome     a. s/p RFCA 2001 by Dr Caryl Comes, pt reports complicated by post procedure DVT  . Persistent atrial fibrillation     a. Dx 08/2012, xarelto initiated; b. 09/2012 Tikosyn initiated -> DCCV -> Sinus c. PVI 03/2013 d. PVI 03/2014  . Sleep apnea     does not use CPAP (he reports having uvulectomy)    Past Surgical History  Procedure Laterality Date  . Back surgery    . Tonsillectomy    . Stomach surgery      reflux, fundoplication  . Nose surgery      sleep apnea surgery  . Tee without cardioversion N/A 08/25/2012    Procedure: TRANSESOPHAGEAL ECHOCARDIOGRAM (TEE);  Surgeon: Thayer Headings, MD;  Location: [place];  Service: Cardiovascular;  Laterality: N/A;  . Cardioversion N/A 08/25/2012    Procedure: CARDIOVERSION;  Surgeon: Thayer Headings, MD;  Location: [facility] ENDOSCOPY;  Service: Cardiovascular;  Laterality: N/A;  . Cardioversion N/A 09/06/2012    Procedure: CARDIOVERSION/Bedside;  Surgeon: Carlena Bjornstad, MD;  Location: [place];  Service: Cardiovascular;  Laterality: N/A;  . Tee without cardioversion  03/23/2013    DR ROSS  . Atrial fibrillation ablation  03/23/2013    PVI by DR Rayann Heman for afib   . Tee without cardioversion N/A 03/23/2013    Procedure: TRANSESOPHAGEAL ECHOCARDIOGRAM (TEE);  Surgeon: Fay Records, MD;  Location: [facility] ENDOSCOPY;  Service: Cardiovascular;  Laterality: N/A;  . Cardioversion N/A 01/12/2014    Procedure: CARDIOVERSION;  Surgeon: Pixie Casino, MD;  Location: [facility] ENDOSCOPY;  Service: Cardiovascular;  Laterality: N/A;  . Cardiac surgery    . Atrial fibrillation ablation N/A 03/23/2013    Procedure: ATRIAL FIBRILLATION ABLATION;  Surgeon: Coralyn Mark, MD;  Location: [place] CATH LAB;  Service: Cardiovascular;  Laterality: N/A;  . Tee without cardioversion N/A 03/26/2014    Procedure: TRANSESOPHAGEAL ECHOCARDIOGRAM (TEE);  Surgeon: Josue Hector, MD;  Location: [facility] ENDOSCOPY;  Service: Cardiovascular;  Laterality: N/A;  . Atrial fibrillation ablation N/A 03/27/2014    PVI Dr Rayann Heman    Current Outpatient Prescriptions  Medication Sig Dispense Refill  . albuterol (PROVENTIL HFA) 108 (90 BASE) MCG/ACT inhaler Inhale 2 puffs into the lungs every 4 (four) hours as needed for wheezing or shortness of breath.    Marland Kitchen atorvastatin (LIPITOR) 10 MG tablet Take 10 mg by mouth daily.    Marland Kitchen azelastine (ASTELIN) 137 MCG/SPRAY nasal spray Place 1 spray into both nostrils 2 (two) times daily.     . Cholecalciferol (VITAMIN D) 2000 UNITS CAPS Take 1 capsule by mouth daily.     . enalapril (VASOTEC) 20 MG tablet Take  0.5 tablets (10 mg total) by mouth daily. 30 tablet 6  . Fluticasone-Salmeterol (ADVAIR DISKUS) 250-50 MCG/DOSE AEPB Inhale 1 puff into the lungs every 12 (twelve) hours.    . furosemide (LASIX) 40 MG tablet Take 1 tablet (40 mg total) by mouth daily. 30 tablet 5  . meloxicam (MOBIC) 15 MG tablet Take 15 mg by mouth daily as needed for pain.     . metoprolol succinate (TOPROL-XL) 25 MG 24 hr tablet Take 50 mg by mouth 2 (two) times daily.    Marland Kitchen omeprazole (PRILOSEC) 20 MG capsule Take 20 mg by mouth daily.    Marland Kitchen oxybutynin (DITROPAN) 5 MG tablet Take 1 tablet by mouth 2  (two) times daily as needed for bladder spasms.     . Potassium Chloride ER 20 MEQ TBCR Take 1 tablet by mouth daily.    . rivaroxaban (XARELTO) 20 MG TABS tablet Take 20 mg by mouth daily with supper.    . tadalafil (CIALIS) 5 MG tablet Take 5 mg by mouth daily as needed for erectile dysfunction.     No current facility-administered medications for this visit.   ROS- he has headaches chronically,dizziness, gerd, continues to gain weight, sinus pressure, itchy eyes, all systems are reviewed and negative except as per HPI above .  No Known Allergies  History   Social History  . Marital Status: Married    Spouse Name: N/A  . Number of Children: 2  . Years of Education: N/A   Occupational History  .  [employer]   Social History Main Topics  . Smoking status: Former Smoker    Quit date: 08/06/2010  . Smokeless tobacco: Never Used  . Alcohol Use: No  . Drug Use: No  . Sexual Activity: Not on file   Other Topics Concern  . Not on file   Social History Narrative   Pt lives in [place] with wife.  Works at [employer] History  Problem Relation Age of Onset  . CAD Father 66  . Diabetes Father   . Heart attack Father   . Transient ischemic attack Father   . Vascular Disease Father   . Diabetes Mother   . Anemia Mother   . Gout Mother   . Diabetes Brother   . Aneurysm Maternal Grandmother   . Stroke Maternal Grandfather   . Diabetes Paternal Grandmother   . Stroke Paternal Grandfather      Physical Exam: Filed Vitals:   06/27/14 1221  BP: 138/84  Pulse: 77  Height: 6\' 1"  (1.854 m)  Weight: 292 lb (132.45 kg)    GEN- The patient is well appearing, alert and oriented x 3 today.   Head- normocephalic, atraumatic Eyes-  Sclera clear, conjunctiva pink Ears- hearing intact Oropharynx- clear Neck- supple, no JVP Lymph- no cervical lymphadenopathy Lungs- Clear to ausculation bilaterally, normal work of breathing Heart-Regular rate and rhythm, no murmurs, rubs or  gallops, PMI not laterally displaced GI- soft, NT, ND, + BS Extremities- no clubbing, cyanosis, trace edema MS- no significant deformity or atrophy Skin- no rash or lesion Psych- euthymic mood, full affect Neuro- strength and sensation are intact  ekg today reveals sinus rhythm at 77 bpm  ECHO-02/03/14 - Left ventricle: The cavity size was normal. Wall thickness was normal. Systolic function was normal. The estimated ejection fraction was in the range of 55% to 60%. Wall motion was normal; there were no regional wall motion abnormalities. The study is not technically sufficient to  allow evaluation of LV diastolic function. - Mitral valve: Valve area by pressure half-time: 1.48 cm^2. - Left atrium: The atrium was mildly dilated.  Assessment and Plan:  1. Persistent Afib/ 2nd PVI 12/22  Has failed  amiodarone , DCCV, flecainide and tikosyn. In SR today off of AAD post ablation I am suspicious that his palpitations do not represent afib.  I do think that cardiac monitoring is appropriate to better evaluate his symptoms.  I would recommend implantable loop recorder for evaluation of palpitations/ afib management post ablation. Continue Toprol 50 mg bid for rate control Continue Xarelto 20 mg bid for chadsvasc score of 2(htn, unspecified HF) Benefit vrs risk for implantation of LINQ device to better assess afib burden and pt would like to proceed, March 25.  2. OSA Compliance with CPAP encouraged.  4. Obesity Body mass index is 38.53 kg/(m^2). Weight loss is strongly encouraged.  F/U as scheduled after linq

## 2014-06-29 NOTE — CV Procedure (Signed)
SURGEON:  Thompson Grayer, MD     PREPROCEDURE DIAGNOSIS:  Palpitations,  Atrial fibrillation s/p ablation    POSTPROCEDURE DIAGNOSIS:  Palpitations,  Atrial fibrillation s/p ablation     PROCEDURES:   1. Implantable loop recorder implantation    INTRODUCTION:  Austin Woods is a 49 y.o. male with a history of atrial fibrillation s/ p ablation.  He has done well post ablation but continues to have palpitations of unclear etiology.   There is significant concern for possible atrial fibrillation as the cause for the patients palpitations.    The patient therefore presents today for implantable loop implantation for afib management post ablation and further evaluation of palpitations.     DESCRIPTION OF PROCEDURE:  Informed written consent was obtained, and the patient was brought to the electrophysiology lab in a fasting state.  The patient required no sedation for the procedure today.  Mapping over the patient's chest was performed by the EP lab staff to identify the area where electrograms were most prominent for ILR recording.  This area was found to be the left parasternal region over the 3rd-4th intercostal space. The patients left chest was therefore prepped and draped in the usual sterile fashion by the EP lab staff. The skin overlying the left parasternal region was infiltrated with lidocaine for local analgesia.  A 0.5-cm incision was made over the left parasternal region over the 3rd intercostal space.  A subcutaneous ILR pocket was fashioned using a combination of sharp and blunt dissection.  A Medtronic Reveal [place] model [id number] implantable loop recorder was then placed into the pocket  R waves were very prominent and measured 0.77mV. EBL<1 ml.  Steri- Strips and a sterile dressing were then applied.  There were no early apparent complications.     CONCLUSIONS:   1. Successful implantation of a Medtronic Reveal LINQ implantable loop recorder for evaluation of palpitations and afib  management s/p ablation  2. No early apparent complications.    Thompson Grayer MD, [facility] 06/29/2014 8:12 AM

## 2014-06-29 NOTE — Interval H&P Note (Signed)
History and Physical Interval Note:  06/29/2014 7:31 AM  Austin Woods  has presented today for surgery, with the diagnosis of afib  The various methods of treatment have been discussed with the patient and family. After consideration of risks, benefits and other options for treatment, the patient has consented to  Procedure(s): LOOP RECORDER IMPLANT (N/A) as a surgical intervention .  The patient's history has been reviewed, patient examined, no change in status, stable for surgery.  I have reviewed the patient's chart and labs.  Questions were answered to the patient's satisfaction.     Thompson Grayer

## 2014-07-08 LAB — MDC_IDC_ENUM_SESS_TYPE_REMOTE: Date Time Interrogation Session: 20160403040500

## 2014-07-09 ENCOUNTER — Ambulatory Visit: Payer: Self-pay

## 2014-07-13 ENCOUNTER — Encounter: Payer: Self-pay | Admitting: Internal Medicine

## 2014-07-13 ENCOUNTER — Ambulatory Visit (INDEPENDENT_AMBULATORY_CARE_PROVIDER_SITE_OTHER): Payer: BLUE CROSS/BLUE SHIELD | Admitting: *Deleted

## 2014-07-13 DIAGNOSIS — I4891 Unspecified atrial fibrillation: Secondary | ICD-10-CM

## 2014-07-13 DIAGNOSIS — R002 Palpitations: Secondary | ICD-10-CM

## 2014-07-13 LAB — MDC_IDC_ENUM_SESS_TYPE_INCLINIC
MDC IDC SESS DTM: 20160408160112
Zone Setting Detection Interval: 2000 ms
Zone Setting Detection Interval: 3000 ms
Zone Setting Detection Interval: 340 ms

## 2014-07-13 NOTE — Progress Notes (Signed)
Wound check in clinic s/p ILR implant. Wound well healed without redness or edema.  Pt with 0 tachy episodes; 122 brady episodes---max dur. 6 mins 38 sec---undersensing; 284 asystole episodes---max dur. 13 sec---undersensing; 8 AF episodes (5.2%)---max dur. 15 hrs 2 mins, Max Avg V 102bpm + Xarelto; 1 symptom episode---pt states that he felt "hot"---SR. R wave-0.81mV-0.10 in office. Sensitivity increased from 0.025mV to 0.078mV (lowest setting). Plan to continue [facility] f/u QMO and f/u w/JA in 3 months.

## 2014-07-24 ENCOUNTER — Encounter: Payer: Self-pay | Admitting: Internal Medicine

## 2014-07-27 ENCOUNTER — Encounter: Payer: Self-pay | Admitting: Internal Medicine

## 2014-07-29 ENCOUNTER — Encounter: Payer: Self-pay | Admitting: Internal Medicine

## 2014-07-30 ENCOUNTER — Encounter: Payer: Self-pay | Admitting: Internal Medicine

## 2014-07-30 ENCOUNTER — Ambulatory Visit (INDEPENDENT_AMBULATORY_CARE_PROVIDER_SITE_OTHER): Payer: BLUE CROSS/BLUE SHIELD | Admitting: *Deleted

## 2014-07-30 DIAGNOSIS — I4891 Unspecified atrial fibrillation: Secondary | ICD-10-CM | POA: Diagnosis not present

## 2014-07-31 ENCOUNTER — Telehealth: Payer: Self-pay | Admitting: Internal Medicine

## 2014-07-31 NOTE — Telephone Encounter (Signed)
LMOM- transmission received, will review and send letter.

## 2014-07-31 NOTE — Telephone Encounter (Signed)
New message        Did you receive my transmission?

## 2014-08-01 NOTE — Progress Notes (Signed)
Loop recorder 

## 2014-08-08 ENCOUNTER — Other Ambulatory Visit: Payer: Self-pay

## 2014-08-08 MED ORDER — RIVAROXABAN 20 MG PO TABS
20.0000 mg | ORAL_TABLET | Freq: Every day | ORAL | Status: DC
Start: 2014-08-08 — End: 2015-02-05

## 2014-08-09 ENCOUNTER — Other Ambulatory Visit: Payer: Self-pay

## 2014-08-10 ENCOUNTER — Encounter: Payer: Self-pay | Admitting: Internal Medicine

## 2014-08-17 ENCOUNTER — Encounter: Payer: Self-pay | Admitting: Internal Medicine

## 2014-08-21 ENCOUNTER — Encounter: Payer: Self-pay | Admitting: Family Medicine

## 2014-08-21 ENCOUNTER — Telehealth: Payer: Self-pay | Admitting: *Deleted

## 2014-08-21 ENCOUNTER — Other Ambulatory Visit: Payer: Self-pay

## 2014-08-21 ENCOUNTER — Ambulatory Visit (INDEPENDENT_AMBULATORY_CARE_PROVIDER_SITE_OTHER): Payer: BLUE CROSS/BLUE SHIELD | Admitting: Family Medicine

## 2014-08-21 VITALS — BP 128/76 | Ht 73.0 in | Wt 287.8 lb

## 2014-08-21 DIAGNOSIS — J4530 Mild persistent asthma, uncomplicated: Secondary | ICD-10-CM | POA: Diagnosis not present

## 2014-08-21 DIAGNOSIS — Z79899 Other long term (current) drug therapy: Secondary | ICD-10-CM

## 2014-08-21 DIAGNOSIS — E785 Hyperlipidemia, unspecified: Secondary | ICD-10-CM | POA: Diagnosis not present

## 2014-08-21 DIAGNOSIS — I10 Essential (primary) hypertension: Secondary | ICD-10-CM | POA: Diagnosis not present

## 2014-08-21 DIAGNOSIS — Z125 Encounter for screening for malignant neoplasm of prostate: Secondary | ICD-10-CM | POA: Diagnosis not present

## 2014-08-21 MED ORDER — METOPROLOL SUCCINATE ER 25 MG PO TB24: 25.0000 mg | ORAL_TABLET | Freq: Two times a day (BID) | ORAL | Status: DC

## 2014-08-21 NOTE — Progress Notes (Signed)
   Subjective:    Patient ID: Austin Woods, male    DOB: 1965/11/22, 49 y.o.   MRN: [id number]  HPI Patient here for 6 month medications check up. He would like to discuss getting his Rx written for one year to reduce the cost by getting them through his company plan.  Patient presents for follow-up of multiple health concerns. Of note he has seen numerous specialists of lates. Some of taking over his prescriptions temporarily while he's waiting to see me.  History of high blood pressure. Patient claims compliance with medicine. Has cut down salt intake.  History of hyperlipidemia. Handling medication well. No obvious side effects.    Currently followed by specialist for his atrial fibrillation. Also cardiomyopathy and underlying Wolff-Parkinson-White syndrome.  Patient has history of asthma. Wheezing overall controlled on current medications. Trying to walk some. ]] Review of Systems No headache no chest pain no back pain no abdominal pain no change in bowel habits    Objective:   Physical Exam  Alert vitals stable H&T normal. Lungs clear heart rhythm under good control. Ankles without edema. Obesity present. Blood pressure good on repeat.      Assessment & Plan:  Impression 1 hypertension good control #2 hyperlipidemia status uncertain #3 asthma good control #4 chronic heart concerns discussed plan appropriate blood work. Diet exercise discussed in encourage. Check every 6 months. WSL

## 2014-08-22 ENCOUNTER — Other Ambulatory Visit: Payer: Self-pay | Admitting: *Deleted

## 2014-08-22 LAB — LIPID PANEL
CHOL/HDL RATIO: 4.2 ratio (ref 0.0–5.0)
Cholesterol, Total: 137 mg/dL (ref 100–199)
HDL: 33 mg/dL — ABNORMAL LOW (ref >39)
LDL CALC: 74 mg/dL (ref 0–99)
Triglycerides: 150 mg/dL — ABNORMAL HIGH (ref 0–149)
VLDL CHOLESTEROL CAL: 30 mg/dL (ref 5–40)

## 2014-08-22 LAB — BASIC METABOLIC PANEL
BUN / CREAT RATIO: 13 (ref 9–20)
BUN: 14 mg/dL (ref 6–24)
CALCIUM: 9.4 mg/dL (ref 8.7–10.2)
CO2: 25 mmol/L (ref 18–29)
CREATININE: 1.04 mg/dL (ref 0.76–1.27)
Chloride: 102 mmol/L (ref 97–108)
GFR calc Af Amer: 96 mL/min/{1.73_m2} (ref >59)
GFR calc non Af Amer: 83 mL/min/{1.73_m2} (ref >59)
Glucose: 105 mg/dL — ABNORMAL HIGH (ref 65–99)
Potassium: 4.3 mmol/L (ref 3.5–5.2)
Sodium: 144 mmol/L (ref 134–144)

## 2014-08-22 LAB — HEPATIC FUNCTION PANEL
ALBUMIN: 4.3 g/dL (ref 3.5–5.5)
ALT: 30 IU/L (ref 0–44)
AST: 25 IU/L (ref 0–40)
Alkaline Phosphatase: 57 IU/L (ref 39–117)
BILIRUBIN TOTAL: 0.8 mg/dL (ref 0.0–1.2)
Bilirubin, Direct: 0.25 mg/dL (ref 0.00–0.40)
TOTAL PROTEIN: 6.8 g/dL (ref 6.0–8.5)

## 2014-08-22 LAB — PSA: Prostate Specific Ag, Serum: 0.2 ng/mL (ref 0.0–4.0)

## 2014-08-22 MED ORDER — ENALAPRIL MALEATE 20 MG PO TABS: 10.0000 mg | ORAL_TABLET | Freq: Every day | ORAL | Status: DC

## 2014-08-22 MED ORDER — POTASSIUM CHLORIDE ER 20 MEQ PO TBCR: 1.0000 | EXTENDED_RELEASE_TABLET | Freq: Every day | ORAL | Status: DC

## 2014-08-23 LAB — CUP PACEART REMOTE DEVICE CHECK: MDC IDC SESS DTM: 20160424040500

## 2014-08-24 ENCOUNTER — Other Ambulatory Visit: Payer: Self-pay

## 2014-08-24 MED ORDER — FUROSEMIDE 40 MG PO TABS: 40.0000 mg | ORAL_TABLET | Freq: Every day | ORAL | Status: DC

## 2014-08-26 ENCOUNTER — Encounter: Payer: Self-pay | Admitting: Family Medicine

## 2014-08-26 DIAGNOSIS — J45909 Unspecified asthma, uncomplicated: Secondary | ICD-10-CM | POA: Insufficient documentation

## 2014-08-28 ENCOUNTER — Ambulatory Visit (INDEPENDENT_AMBULATORY_CARE_PROVIDER_SITE_OTHER): Payer: BLUE CROSS/BLUE SHIELD | Admitting: *Deleted

## 2014-08-28 DIAGNOSIS — I480 Paroxysmal atrial fibrillation: Secondary | ICD-10-CM | POA: Diagnosis not present

## 2014-08-31 NOTE — Progress Notes (Signed)
Loop recorder 

## 2014-09-06 ENCOUNTER — Encounter: Payer: Self-pay | Admitting: Internal Medicine

## 2014-09-07 LAB — CUP PACEART REMOTE DEVICE CHECK: MDC IDC SESS DTM: 20160523040500

## 2014-09-14 ENCOUNTER — Encounter: Payer: Self-pay | Admitting: Cardiology

## 2014-09-14 ENCOUNTER — Encounter: Payer: Self-pay | Admitting: Internal Medicine

## 2014-09-17 ENCOUNTER — Encounter: Payer: Self-pay | Admitting: Internal Medicine

## 2014-09-19 ENCOUNTER — Encounter: Payer: Self-pay | Admitting: Internal Medicine

## 2014-09-19 ENCOUNTER — Other Ambulatory Visit: Payer: Self-pay | Admitting: *Deleted

## 2014-09-19 MED ORDER — ENALAPRIL MALEATE 10 MG PO TABS: 10.0000 mg | ORAL_TABLET | Freq: Every day | ORAL | Status: DC

## 2014-09-19 NOTE — Telephone Encounter (Signed)
Patient requests an rx for the 10mg  tablet as he is having difficulty splitting the 20mg  in half.

## 2014-09-20 ENCOUNTER — Telehealth: Payer: Self-pay | Admitting: Internal Medicine

## 2014-09-20 NOTE — Telephone Encounter (Signed)
New Prob    Pt states he received a letter stating his device box is no longer working. Please advise how to move forward.

## 2014-09-20 NOTE — Telephone Encounter (Signed)
LMOVM for pt to send manual transmission w/ home monitor.

## 2014-09-27 ENCOUNTER — Ambulatory Visit (INDEPENDENT_AMBULATORY_CARE_PROVIDER_SITE_OTHER): Payer: BLUE CROSS/BLUE SHIELD | Admitting: *Deleted

## 2014-09-27 ENCOUNTER — Encounter: Payer: Self-pay | Admitting: Internal Medicine

## 2014-09-27 DIAGNOSIS — I480 Paroxysmal atrial fibrillation: Secondary | ICD-10-CM

## 2014-09-27 NOTE — Progress Notes (Signed)
Loop recorder 

## 2014-10-02 LAB — CUP PACEART REMOTE DEVICE CHECK: Date Time Interrogation Session: 20160623040500

## 2014-10-03 ENCOUNTER — Encounter: Payer: Self-pay | Admitting: Internal Medicine

## 2014-10-10 ENCOUNTER — Encounter: Payer: Self-pay | Admitting: Internal Medicine

## 2014-10-11 ENCOUNTER — Encounter: Payer: Self-pay | Admitting: Internal Medicine

## 2014-10-11 ENCOUNTER — Ambulatory Visit (INDEPENDENT_AMBULATORY_CARE_PROVIDER_SITE_OTHER): Payer: BLUE CROSS/BLUE SHIELD | Admitting: Internal Medicine

## 2014-10-11 VITALS — BP 138/80 | HR 62 | Ht 73.0 in | Wt 274.2 lb

## 2014-10-11 DIAGNOSIS — G4733 Obstructive sleep apnea (adult) (pediatric): Secondary | ICD-10-CM

## 2014-10-11 DIAGNOSIS — I480 Paroxysmal atrial fibrillation: Secondary | ICD-10-CM

## 2014-10-11 LAB — CUP PACEART INCLINIC DEVICE CHECK
MDC IDC SESS DTM: 20160707140634
MDC IDC SET ZONE DETECTION INTERVAL: 340 ms
Zone Setting Detection Interval: 2000 ms
Zone Setting Detection Interval: 3000 ms

## 2014-10-11 NOTE — Progress Notes (Signed)
PCP:  [name], MD Primary Cardiologist:  Bronson Ing Electrophysiologist: Dr. Rayann Heman  The patient presents today for routine electrophysiology follow up.  He has done very well since his last visit.  He had only 1 episode of documented afib on his ILR (6/17).  He is pleased with his low arrhythmia burden. He has lost 17 pounds and is making lifestyle modificaiton. Today, using CPAP recently and does admit makes him fell better. Less  issues with LLE since PVI.   He has mild SOB.    Past Medical History  Diagnosis Date  . Hypertension   . High cholesterol   . Asthma   . GERD (gastroesophageal reflux disease)   . History of DVT (deep vein thrombosis)     a. left leg following ablation for SVT  . Wolff-Parkinson-White (WPW) syndrome     a. s/p RFCA 2001 by Dr Caryl Comes, pt reports complicated by post procedure DVT  . Persistent atrial fibrillation     a. Dx 08/2012, xarelto initiated; b. 09/2012 Tikosyn initiated -> DCCV -> Sinus c. PVI 03/2013 d. PVI 03/2014  . Sleep apnea     does not use CPAP (he reports having uvulectomy)    Past Surgical History  Procedure Laterality Date  . Back surgery    . Tonsillectomy    . Stomach surgery      reflux, fundoplication  . Nose surgery      sleep apnea surgery  . Tee without cardioversion N/A 08/25/2012    Procedure: TRANSESOPHAGEAL ECHOCARDIOGRAM (TEE);  Surgeon: Thayer Headings, MD;  Location: [place];  Service: Cardiovascular;  Laterality: N/A;  . Cardioversion N/A 08/25/2012    Procedure: CARDIOVERSION;  Surgeon: Thayer Headings, MD;  Location[facility] ENDOSCOPY;  Service: Cardiovascular;  Laterality: N/A;  . Cardioversion N/A 09/06/2012    Procedure: CARDIOVERSION/Bedside;  Surgeon: Carlena Bjornstad, MD;  Location: [place];  Service: Cardiovascular;  Laterality: N/A;  . Tee without cardioversion  03/23/2013    DR ROSS  . Atrial fibrillation ablation  03/23/2013    PVI by DR Rayann Heman for afib  . Tee without cardioversion N/A 03/23/2013   Procedure: TRANSESOPHAGEAL ECHOCARDIOGRAM (TEE);  Surgeon: Fay Records, MD;  Location: [facility] ENDOSCOPY;  Service: Cardiovascular;  Laterality: N/A;  . Cardioversion N/A 01/12/2014    Procedure: CARDIOVERSION;  Surgeon: Pixie Casino, MD;  Location: [facility] ENDOSCOPY;  Service: Cardiovascular;  Laterality: N/A;  . Cardiac surgery    . Atrial fibrillation ablation N/A 03/23/2013    Procedure: ATRIAL FIBRILLATION ABLATION;  Surgeon: Coralyn Mark, MD;  Location: [place] CATH LAB;  Service: Cardiovascular;  Laterality: N/A;  . Tee without cardioversion N/A 03/26/2014    Procedure: TRANSESOPHAGEAL ECHOCARDIOGRAM (TEE);  Surgeon: Josue Hector, MD;  Location: [facility] ENDOSCOPY;  Service: Cardiovascular;  Laterality: N/A;  . Atrial fibrillation ablation N/A 03/27/2014    PVI Dr Rayann Heman    Current Outpatient Prescriptions  Medication Sig Dispense Refill  .  Current outpatient prescriptions:  .  albuterol (PROVENTIL HFA) 108 (90 BASE) MCG/ACT inhaler, Inhale 2 puffs into the lungs every 4 (four) hours as needed for wheezing or shortness of breath., Disp: , Rfl:  .  atorvastatin (LIPITOR) 10 MG tablet, Take 10 mg by mouth daily., Disp: , Rfl:  .  azelastine (ASTELIN) 137 MCG/SPRAY nasal spray, Place 1 spray into both nostrils 2 (two) times daily as needed for rhinitis or allergies. , Disp: , Rfl:  .  benzonatate (TESSALON) 100 MG capsule, Take 100 mg by mouth  3 (three) times daily as needed for cough., Disp: , Rfl:  .  Cholecalciferol (VITAMIN D) 2000 UNITS CAPS, Take 1 capsule by mouth daily. , Disp: , Rfl:  .  enalapril (VASOTEC) 10 MG tablet, Take 1 tablet (10 mg total) by mouth daily., Disp: 90 tablet, Rfl: 0 .  Fluticasone-Salmeterol (ADVAIR DISKUS) 250-50 MCG/DOSE AEPB, Inhale 1 puff into the lungs every 12 (twelve) hours., Disp: , Rfl:  .  furosemide (LASIX) 40 MG tablet, Take 1 tablet (40 mg total) by mouth daily., Disp: 90 tablet, Rfl: 2 .  meloxicam (MOBIC) 15 MG tablet, Take 15 mg by mouth daily as needed  for pain. , Disp: , Rfl:  .  metoprolol succinate (TOPROL-XL) 25 MG 24 hr tablet, Take 1 tablet (25 mg total) by mouth 2 (two) times daily., Disp: 180 tablet, Rfl: 1 .  omeprazole (PRILOSEC) 20 MG capsule, Take 20 mg by mouth daily., Disp: , Rfl:  .  oxybutynin (DITROPAN) 5 MG tablet, Take 1 tablet by mouth 2 (two) times daily as needed for bladder spasms. , Disp: , Rfl:  .  Potassium Chloride ER 20 MEQ TBCR, Take 1 tablet by mouth daily., Disp: 90 tablet, Rfl: 3 .  rivaroxaban (XARELTO) 20 MG TABS tablet, Take 1 tablet (20 mg total) by mouth daily with supper., Disp: 30 tablet, Rfl: 6 .  tadalafil (CIALIS) 5 MG tablet, Take 5 mg by mouth daily as needed for erectile dysfunction., Disp: , Rfl:       .      .      .      .      .      .      .      .      .      .      .      .      .       No current facility-administered medications for this visit.   ROS- he has headaches chronically,dizziness, gerd, continues to gain weight, sinus pressure, itchy eyes, all systems are reviewed and negative except as per HPI above .  No Known Allergies  History   Social History  . Marital Status: Married    Spouse Name: N/A  . Number of Children: 2  . Years of Education: N/A   Occupational History  .  [employer]   Social History Main Topics  . Smoking status: Former Smoker    Quit date: 08/06/2010  . Smokeless tobacco: Never Used  . Alcohol Use: No  . Drug Use: No  . Sexual Activity: Not on file   Other Topics Concern  . Not on file   Social History Narrative   Pt lives in [place] with wife.  Works at [employer] History  Problem Relation Age of Onset  . CAD Father 51  . Diabetes Father   . Heart attack Father   . Transient ischemic attack Father   . Vascular Disease Father   . Diabetes Mother   . Anemia Mother   . Gout Mother   . Diabetes Brother   . Aneurysm Maternal Grandmother   . Stroke Maternal Grandfather   . Diabetes Paternal Grandmother   . Stroke  Paternal Grandfather      Physical Exam: Filed Vitals:   06/27/14 1221  BP: 138/84  Pulse: 77  Height: 6\' 1"  (1.854 m)  Weight: 292 lb (132.45 kg)  GEN- The patient is well appearing, alert and oriented x 3 today.   Head- normocephalic, atraumatic Eyes-  Sclera clear, conjunctiva pink Ears- hearing intact Oropharynx- clear Neck- supple, no JVP Lymph- no cervical lymphadenopathy Lungs- Clear to ausculation bilaterally, normal work of breathing Heart-Regular rate and rhythm, no murmurs, rubs or gallops, PMI not laterally displaced GI- soft, NT, ND, + BS Extremities- no clubbing, cyanosis, trace edema MS- no significant deformity or atrophy Skin- no rash or lesion Psych- euthymic mood, full affect Neuro- strength and sensation are intact  ekg today reveals sinus rhythm  ILR site is well healed Interrogation is reviewed  Assessment and Plan:  1. Persistent Afib/ 2nd PVI 12/22 Doing very well off of AAD therapy No changes today  2. OSA Compliance with CPAP encouraged.  4. Obesity Body mass index is Body mass index is 36.18 kg/(m^2).  Weight loss is strongly encouraged. cardiofit data reviewed  Return to see me in 3 months

## 2014-10-11 NOTE — Patient Instructions (Signed)
Medication Instructions: - no changes  Labwork: - none  Procedures/Testing: - none  Follow-Up: - Your physician wants you to follow-up in: 3 months with Dr. Rayann Heman. You will receive a reminder letter in the mail two months in advance. If you don't receive a letter, please call our office to schedule the follow-up appointment.  Any Additional Special Instructions Will Be Listed Below (If Applicable). - none

## 2014-10-15 ENCOUNTER — Encounter: Payer: Self-pay | Admitting: Internal Medicine

## 2014-10-16 ENCOUNTER — Encounter: Payer: Self-pay | Admitting: Internal Medicine

## 2014-10-26 ENCOUNTER — Ambulatory Visit (INDEPENDENT_AMBULATORY_CARE_PROVIDER_SITE_OTHER): Payer: BLUE CROSS/BLUE SHIELD | Admitting: *Deleted

## 2014-10-26 DIAGNOSIS — I480 Paroxysmal atrial fibrillation: Secondary | ICD-10-CM

## 2014-10-27 ENCOUNTER — Encounter: Payer: Self-pay | Admitting: Internal Medicine

## 2014-10-29 ENCOUNTER — Encounter: Payer: Self-pay | Admitting: Internal Medicine

## 2014-11-08 ENCOUNTER — Telehealth: Payer: Self-pay | Admitting: *Deleted

## 2014-11-08 NOTE — Telephone Encounter (Signed)
Follow up ° ° °Pt returning your call °

## 2014-11-08 NOTE — Telephone Encounter (Signed)
lmtcb/sss 

## 2014-11-09 NOTE — Telephone Encounter (Signed)
Spoke to patient regarding ILR sx episode recorded on 7-30. Patient states that he felt "fluttering" x 10 mins. Resolved without medical intervention. Will inform JA of episode and call pt w/any further recommendations.

## 2014-11-13 NOTE — Progress Notes (Signed)
Loop recorder 

## 2014-11-15 ENCOUNTER — Encounter: Payer: Self-pay | Admitting: Internal Medicine

## 2014-11-19 LAB — CUP PACEART REMOTE DEVICE CHECK: Date Time Interrogation Session: 20160731040500

## 2014-11-21 ENCOUNTER — Encounter: Payer: Self-pay | Admitting: Internal Medicine

## 2014-11-25 ENCOUNTER — Other Ambulatory Visit: Payer: Self-pay | Admitting: Internal Medicine

## 2014-11-26 ENCOUNTER — Ambulatory Visit (INDEPENDENT_AMBULATORY_CARE_PROVIDER_SITE_OTHER): Payer: BLUE CROSS/BLUE SHIELD | Admitting: *Deleted

## 2014-11-26 DIAGNOSIS — I480 Paroxysmal atrial fibrillation: Secondary | ICD-10-CM

## 2014-11-28 NOTE — Progress Notes (Signed)
Loop recorder 

## 2014-12-03 LAB — CUP PACEART REMOTE DEVICE CHECK: MDC IDC SESS DTM: 20160829120950

## 2014-12-05 ENCOUNTER — Telehealth: Payer: Self-pay | Admitting: *Deleted

## 2014-12-05 NOTE — Telephone Encounter (Signed)
Called patient regarding any symptoms he experienced during symptom episodes on 11/28/14 and 12/01/14 and during AF episodes on 8/25, 8/26, and 8/29.  Patient reports feeling brief fluttering sensations during symptom episodes, as well as feeling like his heart does when he "gets excited".  Patient agrees that his symptom episodes are generally early in the morning (0100-0300Patient reports taking his medications as prescribed, including his Xarelto and metoprolol.  Patient instructed to use symptom activator if he experiences new symptoms.  Patient voices understanding of all instructions and denies any questions or concerns at this time.  Patient aware to call with worsening symptoms.  ECGs/alerts given to Dr. Rayann Heman for review.

## 2014-12-06 NOTE — Telephone Encounter (Signed)
He has had these before... Continue current plan.

## 2014-12-12 ENCOUNTER — Encounter: Payer: Self-pay | Admitting: Internal Medicine

## 2014-12-20 ENCOUNTER — Encounter: Payer: Self-pay | Admitting: Internal Medicine

## 2014-12-26 ENCOUNTER — Encounter: Payer: Self-pay | Admitting: Internal Medicine

## 2014-12-26 ENCOUNTER — Ambulatory Visit (INDEPENDENT_AMBULATORY_CARE_PROVIDER_SITE_OTHER): Payer: BLUE CROSS/BLUE SHIELD | Admitting: *Deleted

## 2014-12-26 DIAGNOSIS — I480 Paroxysmal atrial fibrillation: Secondary | ICD-10-CM

## 2014-12-31 NOTE — Progress Notes (Signed)
Loop recorder 

## 2015-01-14 IMAGING — CR DG CHEST 2V
3 series · 3 of 3 positions shown · non-contrast
Comparison: 08/13/2003

CLINICAL DATA: Palpitations, hypertension

CHEST - 2 VIEW

[view not recorded (1 of 3)]
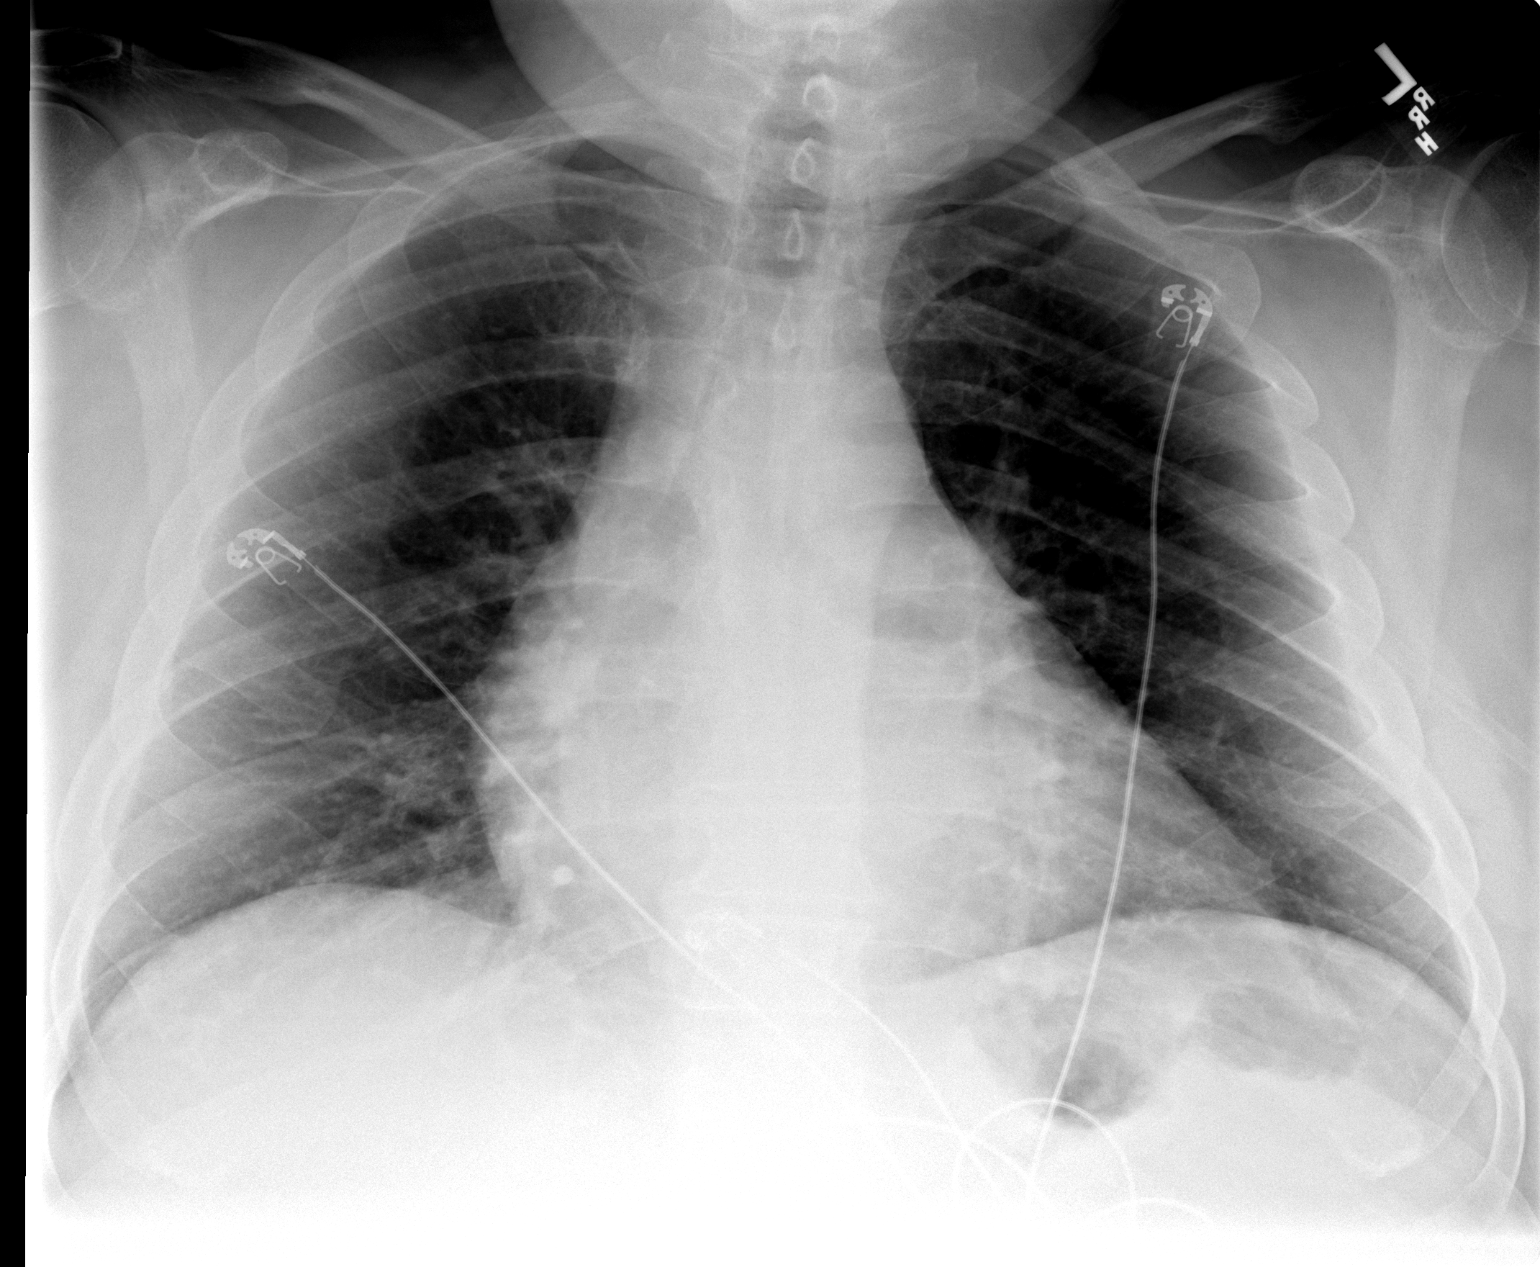

[view not recorded (2 of 3)]
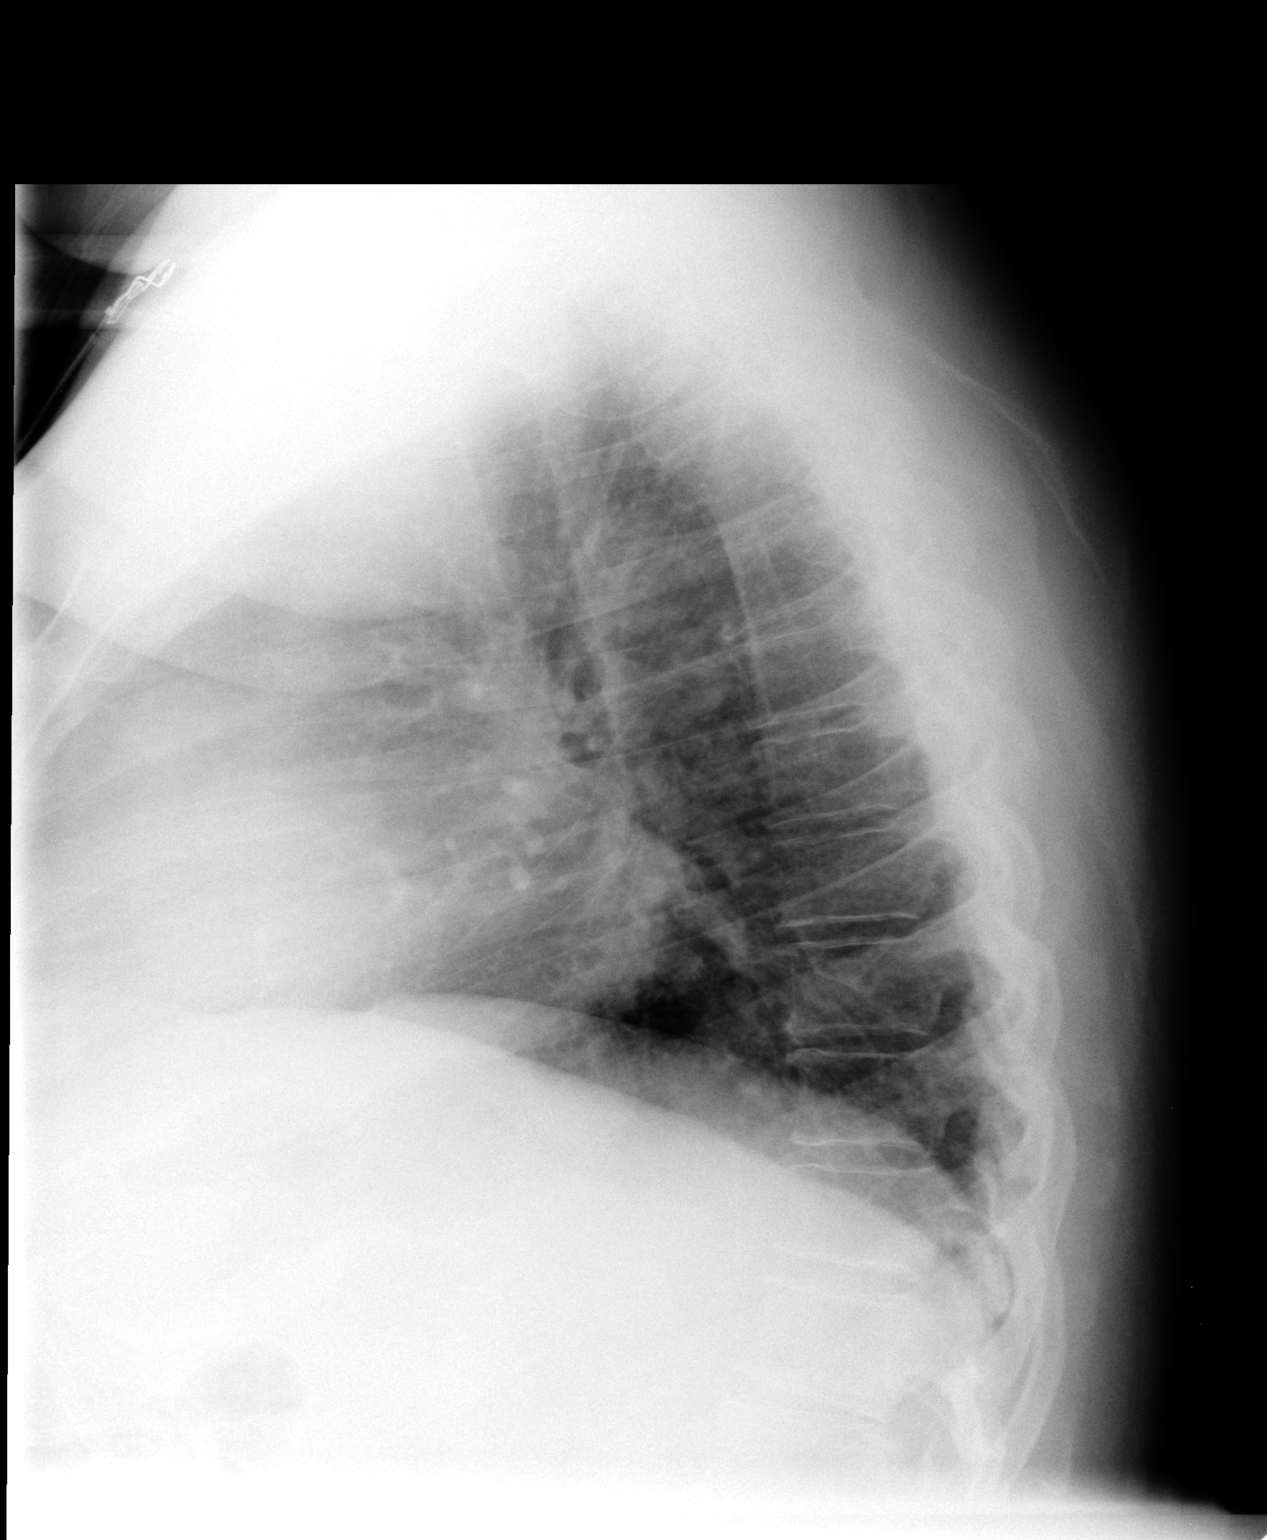

[view not recorded (3 of 3)]
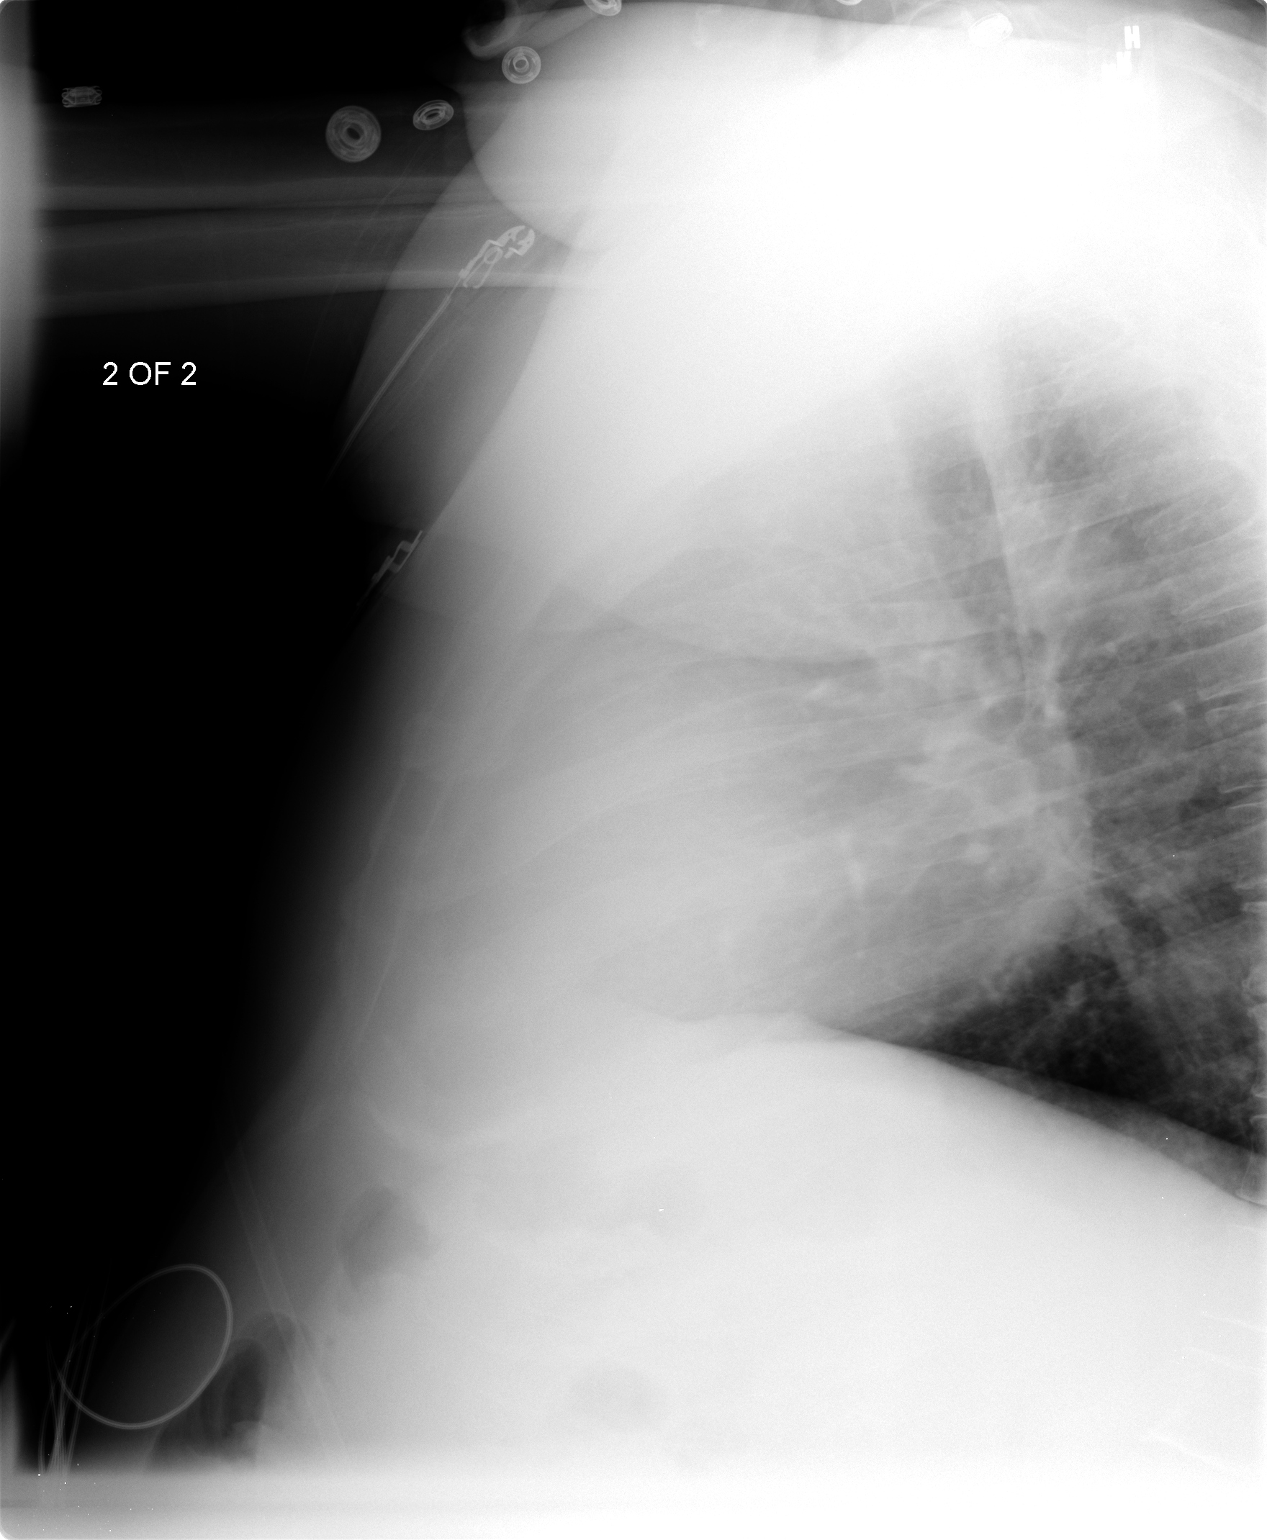

[3 of 3 positions shown; findings below may reference images not displayed]

FINDINGS: Moderate enlargement cardiomediastinal silhouette is
noted.  Mild central vascular congestion without overt edema.  No
focal pulmonary opacity.  No pleural effusion.  No acute osseous
finding.
IMPRESSION: Moderate cardiomegaly with central vascular congestion.  No acute
finding.

## 2015-01-20 LAB — CUP PACEART REMOTE DEVICE CHECK: Date Time Interrogation Session: 20160921130731

## 2015-01-20 NOTE — Progress Notes (Signed)
[facility] summary report received. Battery status OK. Normal device function. No new tachy, brady, or pause episodes. 2 symptom episodes--patient reports feeling brief fluttering sensations, continue current plan per routed phone note from 12/05/14. 119 AF episodes (burden 4.6%), +Xarelto, avg V rate controlled. EGMs suggest some undersensing and artifact--will address at next office visit. Monthly summary reports and ROV with JA in 01/2015 (recall letter sent).

## 2015-01-25 ENCOUNTER — Ambulatory Visit (INDEPENDENT_AMBULATORY_CARE_PROVIDER_SITE_OTHER): Payer: BLUE CROSS/BLUE SHIELD | Admitting: *Deleted

## 2015-01-25 DIAGNOSIS — I480 Paroxysmal atrial fibrillation: Secondary | ICD-10-CM | POA: Diagnosis not present

## 2015-01-26 ENCOUNTER — Other Ambulatory Visit: Payer: Self-pay | Admitting: Family Medicine

## 2015-01-28 NOTE — Progress Notes (Signed)
Loop recorder 

## 2015-02-01 ENCOUNTER — Encounter: Payer: Self-pay | Admitting: Nurse Practitioner

## 2015-02-01 ENCOUNTER — Ambulatory Visit (INDEPENDENT_AMBULATORY_CARE_PROVIDER_SITE_OTHER): Payer: BLUE CROSS/BLUE SHIELD | Admitting: Nurse Practitioner

## 2015-02-01 VITALS — BP 124/80 | Temp 98.2°F | Ht 73.0 in | Wt 273.0 lb

## 2015-02-01 DIAGNOSIS — B372 Candidiasis of skin and nail: Secondary | ICD-10-CM

## 2015-02-01 MED ORDER — KETOCONAZOLE 2 % EX CREA: 1.0000 "application " | TOPICAL_CREAM | Freq: Two times a day (BID) | CUTANEOUS | Status: DC

## 2015-02-01 MED ORDER — TRIAMCINOLONE ACETONIDE 0.1 % EX CREA: 1.0000 "application " | TOPICAL_CREAM | Freq: Two times a day (BID) | CUTANEOUS | Status: DC

## 2015-02-01 NOTE — Progress Notes (Signed)
Subjective:   Presents for coplaints of itching and burningin the ectal ara over the past week. No fever. No abdominal pain. Has an old hemorrhoid in the rectal area that is been there long-term, does not cause any problems. No blood in his stool. No abdominal pain. No constipation or diarrhea. Blood sugars have been stable.  Objective:   BP 124/80 mmHg  Temp(Src) 98.2 F (36.8 C) (Oral)  Ht 6\' 1"  (1.854 m)  Wt 273 lb (123.832 kg)  BMI 36.03 kg/m2  NAD. Alert, oriented. Confluent mildly erythematous rash noted all along the anal area. Small nonerythematous skin tag noted from previous hemorrhoid. Nontender.  Assessment: Yeast dermatitis  Plan:  Meds ordered this encounter  Medications  . triamcinolone cream (KENALOG) 0.1 %    Sig: Apply 1 application topically 2 (two) times daily. Prn rash; use up to 2 weeks    Dispense:  30 g    Refill:  0    Order Specific Question:  Supervising Provider    Answer:  [name] [[id number]]  . ketoconazole (NIZORAL) 2 % cream    Sig: Apply 1 application topically 2 (two) times daily.    Dispense:  30 g    Refill:  4    Order Specific Question:  Supervising Provider    Answer:  [name] [[id number]]    call back if worsens or persists.

## 2015-02-05 ENCOUNTER — Telehealth: Payer: Self-pay | Admitting: Internal Medicine

## 2015-02-05 ENCOUNTER — Other Ambulatory Visit: Payer: Self-pay

## 2015-02-05 MED ORDER — RIVAROXABAN 20 MG PO TABS: 20.0000 mg | ORAL_TABLET | Freq: Every day | ORAL | Status: DC

## 2015-02-05 NOTE — Telephone Encounter (Signed)
New message      STAT if patient is at the pharmacy , call can be transferred to refill team.   1. Which medications need to be refilled?  xarelto 20mg -----pt is out of medications  2. Which pharmacy/location is medication to be sent to? [facility]  3. Do they need a 30 day or 90 day supply? 30day supply

## 2015-02-11 ENCOUNTER — Encounter: Payer: Self-pay | Admitting: Internal Medicine

## 2015-02-14 ENCOUNTER — Telehealth: Payer: Self-pay | Admitting: Family Medicine

## 2015-02-14 ENCOUNTER — Encounter: Payer: Self-pay | Admitting: Cardiology

## 2015-02-14 NOTE — Telephone Encounter (Signed)
See folder for labs from [facility]  As per your request

## 2015-02-18 ENCOUNTER — Ambulatory Visit (INDEPENDENT_AMBULATORY_CARE_PROVIDER_SITE_OTHER): Payer: BLUE CROSS/BLUE SHIELD | Admitting: Family Medicine

## 2015-02-18 ENCOUNTER — Encounter: Payer: Self-pay | Admitting: Family Medicine

## 2015-02-18 VITALS — BP 138/88 | Ht 73.0 in | Wt 274.2 lb

## 2015-02-18 DIAGNOSIS — Z23 Encounter for immunization: Secondary | ICD-10-CM

## 2015-02-18 DIAGNOSIS — I10 Essential (primary) hypertension: Secondary | ICD-10-CM | POA: Diagnosis not present

## 2015-02-18 DIAGNOSIS — K640 First degree hemorrhoids: Secondary | ICD-10-CM

## 2015-02-18 DIAGNOSIS — E785 Hyperlipidemia, unspecified: Secondary | ICD-10-CM

## 2015-02-18 MED ORDER — HYDROCORTISONE 2.5 % RE CREA: 1.0000 "application " | TOPICAL_CREAM | Freq: Three times a day (TID) | RECTAL | Status: DC

## 2015-02-18 NOTE — Progress Notes (Signed)
   Subjective:    Patient ID: Austin Woods, male    DOB: 26-Mar-1966, 49 y.o.   MRN: [id number] Patient patient arrives office with several concerns.   Hyperlipidemia This is a chronic problem. The current episode started more than 1 year ago. Treatments tried: lipitor. There are no compliance problems.  Risk factors for coronary artery disease include hypertension and dyslipidemia.   compliant with lipid medication. No obvious side effects. Medications reviewed today. Next  Compliant with blood pressure medicine. Watch his salt intake. Blood pressure generally good when checked elsewhere. Medications reviewed.   Patient would like area on bottom checked. Seen here earlier last month with flare of hemorrhoids. Use triamcinolone did not help much. Still aggravating at times. Patient has not had colonoscopy yet.    Vit D on the low side, vitamin D dose increased by the nurse at work no headache  Review of Systems No headache no chest pain no back pain no abdominal pain ROS otherwise negative    Objective:   Physical Exam BP 128/82 on repeat HEENT normal lungs clear heart regular in rhythm ankles without edema Rectal exam small hemorrhoid some inflammation slight tenderness prostate normal rectal exam normal otherwise      Assessment & Plan:  Impression 1 hypertension good control meds reviewed maintain same #2 hyperlipidemia good control maintain same meds reviewed #3 flare of hemorrhoids discuss plan patient set up screening colonoscopy. Switch to Anusol HC 3 times a day. Maintain same blood pressure and lipid medicine. Diet exercise discussed. Check every 6 months. WSL

## 2015-02-24 LAB — CUP PACEART REMOTE DEVICE CHECK: MDC IDC SESS DTM: 20161021130645

## 2015-02-24 NOTE — Progress Notes (Signed)
[facility] summary report received. Battery status OK. Normal device function. No new symptom episodes, tachy episodes, brady, or pause episodes. 102 AF episodes (burden 4.9%), +Xarelto and metoprolol, avg V rate controlled. Monthly summary reports and ROV with JA on 02/25/15 at 12:15pm.

## 2015-02-25 ENCOUNTER — Other Ambulatory Visit: Payer: Self-pay

## 2015-02-25 ENCOUNTER — Ambulatory Visit (INDEPENDENT_AMBULATORY_CARE_PROVIDER_SITE_OTHER): Payer: BLUE CROSS/BLUE SHIELD | Admitting: *Deleted

## 2015-02-25 ENCOUNTER — Encounter: Payer: Self-pay | Admitting: Internal Medicine

## 2015-02-25 ENCOUNTER — Ambulatory Visit (INDEPENDENT_AMBULATORY_CARE_PROVIDER_SITE_OTHER): Payer: BLUE CROSS/BLUE SHIELD | Admitting: Internal Medicine

## 2015-02-25 VITALS — BP 138/82 | HR 59 | Ht 73.0 in | Wt 272.4 lb

## 2015-02-25 DIAGNOSIS — G4733 Obstructive sleep apnea (adult) (pediatric): Secondary | ICD-10-CM

## 2015-02-25 DIAGNOSIS — I480 Paroxysmal atrial fibrillation: Secondary | ICD-10-CM

## 2015-02-25 DIAGNOSIS — R002 Palpitations: Secondary | ICD-10-CM | POA: Diagnosis not present

## 2015-02-25 DIAGNOSIS — I10 Essential (primary) hypertension: Secondary | ICD-10-CM

## 2015-02-25 LAB — CBC WITH DIFFERENTIAL/PLATELET
Basophils Absolute: 0 10*3/uL (ref 0.0–0.1)
Basophils Relative: 0 % (ref 0–1)
EOS PCT: 1 % (ref 0–5)
Eosinophils Absolute: 0.1 10*3/uL (ref 0.0–0.7)
HEMATOCRIT: 43.8 % (ref 39.0–52.0)
Hemoglobin: 14.5 g/dL (ref 13.0–17.0)
LYMPHS PCT: 22 % (ref 12–46)
Lymphs Abs: 1.7 10*3/uL (ref 0.7–4.0)
MCH: 31.3 pg (ref 26.0–34.0)
MCHC: 33.1 g/dL (ref 30.0–36.0)
MCV: 94.6 fL (ref 78.0–100.0)
MONO ABS: 0.5 10*3/uL (ref 0.1–1.0)
MONOS PCT: 6 % (ref 3–12)
MPV: 10.9 fL (ref 8.6–12.4)
Neutro Abs: 5.5 10*3/uL (ref 1.7–7.7)
Neutrophils Relative %: 71 % (ref 43–77)
Platelets: 234 10*3/uL (ref 150–400)
RBC: 4.63 MIL/uL (ref 4.22–5.81)
RDW: 13.4 % (ref 11.5–15.5)
WBC: 7.7 10*3/uL (ref 4.0–10.5)

## 2015-02-25 LAB — BASIC METABOLIC PANEL
BUN: 13 mg/dL (ref 7–25)
CALCIUM: 9.3 mg/dL (ref 8.6–10.3)
CO2: 25 mmol/L (ref 20–31)
Chloride: 105 mmol/L (ref 98–110)
Creat: 1 mg/dL (ref 0.70–1.33)
GLUCOSE: 104 mg/dL — AB (ref 65–99)
Potassium: 4.1 mmol/L (ref 3.5–5.3)
Sodium: 140 mmol/L (ref 135–146)

## 2015-02-25 LAB — CUP PACEART INCLINIC DEVICE CHECK: MDC IDC SESS DTM: 20161121182929

## 2015-02-25 LAB — HEPATIC FUNCTION PANEL
ALBUMIN: 4.1 g/dL (ref 3.6–5.1)
ALK PHOS: 43 U/L (ref 40–115)
ALT: 29 U/L (ref 9–46)
AST: 27 U/L (ref 10–35)
BILIRUBIN TOTAL: 1.2 mg/dL (ref 0.2–1.2)
Bilirubin, Direct: 0.2 mg/dL (ref <=0.2)
Indirect Bilirubin: 1 mg/dL (ref 0.2–1.2)
TOTAL PROTEIN: 6.7 g/dL (ref 6.1–8.1)

## 2015-02-25 LAB — MAGNESIUM: Magnesium: 1.9 mg/dL (ref 1.5–2.5)

## 2015-02-25 NOTE — Progress Notes (Signed)
PCP:  [name], MD Primary Cardiologist:  Bronson Ing Electrophysiologist: Dr. Rayann Heman  The patient presents today for routine electrophysiology follow up.  He has done very well since his last visit.   ILR reveals very little AF. He has lost 20 pounds since his last visit (17 lbs before that) and is making lifestyle modificaiton. Today, using CPAP recently and does admit makes him fell better.    He has mild SOB.  + leg cramping,  + soreness over ILR sight.    Past Medical History  Diagnosis Date  . Hypertension   . High cholesterol   . Asthma   . GERD (gastroesophageal reflux disease)   . History of DVT (deep vein thrombosis)     a. left leg following ablation for SVT  . Wolff-Parkinson-White (WPW) syndrome     a. s/p RFCA 2001 by Dr Caryl Comes, pt reports complicated by post procedure DVT  . Persistent atrial fibrillation     a. Dx 08/2012, xarelto initiated; b. 09/2012 Tikosyn initiated -> DCCV -> Sinus c. PVI 03/2013 d. PVI 03/2014  . Sleep apnea     does not use CPAP (he reports having uvulectomy)    Past Surgical History  Procedure Laterality Date  . Back surgery    . Tonsillectomy    . Stomach surgery      reflux, fundoplication  . Nose surgery      sleep apnea surgery  . Tee without cardioversion N/A 08/25/2012    Procedure: TRANSESOPHAGEAL ECHOCARDIOGRAM (TEE);  Surgeon: Thayer Headings, MD;  Location: [place];  Service: Cardiovascular;  Laterality: N/A;  . Cardioversion N/A 08/25/2012    Procedure: CARDIOVERSION;  Surgeon: Thayer Headings, MD;  Location: [facility] ENDOSCOPY;  Service: Cardiovascular;  Laterality: N/A;  . Cardioversion N/A 09/06/2012    Procedure: CARDIOVERSION/Bedside;  Surgeon: Carlena Bjornstad, MD;  Location: [place];  Service: Cardiovascular;  Laterality: N/A;  . Tee without cardioversion  03/23/2013    DR ROSS  . Atrial fibrillation ablation  03/23/2013    PVI by DR Rayann Heman for afib  . Tee without cardioversion N/A 03/23/2013    Procedure: TRANSESOPHAGEAL  ECHOCARDIOGRAM (TEE);  Surgeon: Fay Records, MD;  Location: [facility] ENDOSCOPY;  Service: Cardiovascular;  Laterality: N/A;  . Cardioversion N/A 01/12/2014    Procedure: CARDIOVERSION;  Surgeon: Pixie Casino, MD;  Location: [facility] ENDOSCOPY;  Service: Cardiovascular;  Laterality: N/A;  . Cardiac surgery    . Atrial fibrillation ablation N/A 03/23/2013    Procedure: ATRIAL FIBRILLATION ABLATION;  Surgeon: Coralyn Mark, MD;  Location: [place] CATH LAB;  Service: Cardiovascular;  Laterality: N/A;  . Tee without cardioversion N/A 03/26/2014    Procedure: TRANSESOPHAGEAL ECHOCARDIOGRAM (TEE);  Surgeon: Josue Hector, MD;  Location: [facility] ENDOSCOPY;  Service: Cardiovascular;  Laterality: N/A;  . Atrial fibrillation ablation N/A 03/27/2014    PVI Dr Rayann Heman    Current Outpatient Prescriptions  Medication Sig Dispense Refill  .  Current outpatient prescriptions:  .  albuterol (PROVENTIL HFA) 108 (90 BASE) MCG/ACT inhaler, Inhale 2 puffs into the lungs every 4 (four) hours as needed for wheezing or shortness of breath., Disp: , Rfl:  .  atorvastatin (LIPITOR) 10 MG tablet, Take 10 mg by mouth daily., Disp: , Rfl:  .  Cholecalciferol (VITAMIN D) 2000 UNITS CAPS, Take 1 capsule by mouth daily. , Disp: , Rfl:  .  enalapril (VASOTEC) 10 MG tablet, TAKE 1 TABLET DAILY, Disp: 90 tablet, Rfl: 1 .  Fluticasone-Salmeterol (ADVAIR DISKUS) 250-50 MCG/DOSE  AEPB, Inhale 1 puff into the lungs every 12 (twelve) hours., Disp: , Rfl:  .  furosemide (LASIX) 40 MG tablet, Take 1 tablet (40 mg total) by mouth daily., Disp: 90 tablet, Rfl: 2 .  hydrocortisone (ANUSOL-HC) 2.5 % rectal cream, Place 1 application rectally 3 (three) times daily., Disp: 30 g, Rfl: 0 .  ketoconazole (NIZORAL) 2 % cream, Apply 1 application topically 2 (two) times daily., Disp: 30 g, Rfl: 4 .  meloxicam (MOBIC) 15 MG tablet, Take 15 mg by mouth daily as needed for pain. , Disp: , Rfl:  .  metoprolol succinate (TOPROL-XL) 25 MG 24 hr tablet, TAKE 1 TABLET  TWICE A DAY, Disp: 180 tablet, Rfl: 0 .  omeprazole (PRILOSEC) 20 MG capsule, Take 20 mg by mouth daily., Disp: , Rfl:  .  oxybutynin (DITROPAN) 5 MG tablet, Take 1 tablet by mouth 2 (two) times daily as needed for bladder spasms. , Disp: , Rfl:  .  Potassium Chloride ER 20 MEQ TBCR, Take 1 tablet by mouth daily., Disp: 90 tablet, Rfl: 3 .  rivaroxaban (XARELTO) 20 MG TABS tablet, Take 1 tablet (20 mg total) by mouth daily with supper., Disp: 30 tablet, Rfl: 1 .  tadalafil (CIALIS) 5 MG tablet, Take 5 mg by mouth daily as needed for erectile dysfunction., Disp: , Rfl:  .  triamcinolone cream (KENALOG) 0.1 %, Apply 1 application topically 2 (two) times daily. Prn rash; use up to 2 weeks, Disp: 30 g, Rfl: 0 .  Vitamin D, Ergocalciferol, (DRISDOL) 50000 UNITS CAPS capsule, , Disp: , Rfl:       .      .      .      .      .      .      .      .      .      .      .      .      .       No current facility-administered medications for this visit.   ROS- he has headaches chronically,dizziness, gerd, continues to gain weight, sinus pressure, itchy eyes, all systems are reviewed and negative except as per HPI above .  No Known Allergies  History   Social History  . Marital Status: Married    Spouse Name: N/A  . Number of Children: 2  . Years of Education: N/A   Occupational History  .  [employer]   Social History Main Topics  . Smoking status: Former Smoker    Quit date: 08/06/2010  . Smokeless tobacco: Never Used  . Alcohol Use: No  . Drug Use: No  . Sexual Activity: Not on file   Other Topics Concern  . Not on file   Social History Narrative   Pt lives in [place] with wife.  Works at [employer] History  Problem Relation Age of Onset  . CAD Father 68  . Diabetes Father   . Heart attack Father   . Transient ischemic attack Father   . Vascular Disease Father   . Diabetes Mother   . Anemia Mother   . Gout Mother   . Diabetes Brother   . Aneurysm Maternal  Grandmother   . Stroke Maternal Grandfather   . Diabetes Paternal Grandmother   . Stroke Paternal Grandfather      Physical Exam:  Filed Vitals:   02/25/15 1225  BP: 138/82  Pulse: 59   GEN- The patient is well appearing, alert and oriented x 3 today.   Head- normocephalic, atraumatic Eyes-  Sclera clear, conjunctiva pink Ears- hearing intact Oropharynx- clear Neck- supple, no JVP Lymph- no cervical lymphadenopathy Lungs- Clear to ausculation bilaterally, normal work of breathing Heart-Regular rate and rhythm, no murmurs, rubs or gallops, PMI not laterally displaced GI- soft, NT, ND, + BS Extremities- no clubbing, cyanosis, trace edema MS- no significant deformity or atrophy Skin- no rash or lesion Psych- euthymic mood, full affect Neuro- strength and sensation are intact   ILR site is well healed Interrogation is reviewed  Assessment and Plan:  1. Persistent Afib/ 2nd PVI 12/22 Doing very well off of AAD therapy No changes today ILR reveals primarily artifact and sinus with PACs.  I do not see significant AF, though AF burden is 4%.  WE discussed ILR removal today.  He wishes to keep it in place for now.  2. OSA Compliance with CPAP encouraged.  3. HTN Stable No change required today Bmet, mg today  4. Obesity Body mass index is Body mass index is 35.95 kg/(m^2).  I am pleased with his efforts.  Weight loss is strongly encouraged.  Return to see me in 6 months  Thompson Grayer MD, [facility] 02/25/2015 12:51 PM

## 2015-02-25 NOTE — Patient Instructions (Addendum)
Medication Instructions:  Your physician recommends that you continue on your current medications as directed. Please refer to the Current Medication list given to you today.   Labwork: Your physician recommends that you return for lab work today: BMP/CBC/Mag/liver    Testing/Procedures: None ordered   Follow-Up: Your physician wants you to follow-up in: 6 months with Dr Rayann Heman Dennis Bast will receive a reminder letter in the mail two months in advance. If you don't receive a letter, please call our office to schedule the follow-up appointment.   Any Other Special Instructions Will Be Listed Below (If Applicable).     If you need a refill on your cardiac medications before your next appointment, please call your pharmacy.

## 2015-02-26 ENCOUNTER — Other Ambulatory Visit: Payer: Self-pay

## 2015-02-26 MED ORDER — RIVAROXABAN 20 MG PO TABS: 20.0000 mg | ORAL_TABLET | Freq: Every day | ORAL | Status: DC

## 2015-02-26 NOTE — Progress Notes (Signed)
LOOP RECORDER  

## 2015-03-27 ENCOUNTER — Ambulatory Visit (INDEPENDENT_AMBULATORY_CARE_PROVIDER_SITE_OTHER): Payer: BLUE CROSS/BLUE SHIELD | Admitting: *Deleted

## 2015-03-27 DIAGNOSIS — I480 Paroxysmal atrial fibrillation: Secondary | ICD-10-CM | POA: Diagnosis not present

## 2015-03-27 NOTE — Progress Notes (Signed)
Carelink Summary Report / Loop Recorder 

## 2015-03-29 ENCOUNTER — Encounter: Payer: Self-pay | Admitting: Family Medicine

## 2015-03-29 ENCOUNTER — Ambulatory Visit (INDEPENDENT_AMBULATORY_CARE_PROVIDER_SITE_OTHER): Payer: BLUE CROSS/BLUE SHIELD | Admitting: Family Medicine

## 2015-03-29 VITALS — BP 132/80 | Temp 98.6°F | Ht 73.0 in | Wt 267.0 lb

## 2015-03-29 DIAGNOSIS — J019 Acute sinusitis, unspecified: Secondary | ICD-10-CM

## 2015-03-29 MED ORDER — AMOXICILLIN-POT CLAVULANATE 875-125 MG PO TABS
1.0000 | ORAL_TABLET | Freq: Two times a day (BID) | ORAL | Status: DC
Start: 2015-03-29 — End: 2015-08-28

## 2015-03-29 MED ORDER — METHYLPREDNISOLONE ACETATE 40 MG/ML IJ SUSP
40.0000 mg | Freq: Once | INTRAMUSCULAR | Status: AC
  Administered 2015-03-29: 40 mg via INTRAMUSCULAR

## 2015-03-29 MED ORDER — AMOXICILLIN-POT CLAVULANATE 875-125 MG PO TABS: 1.0000 | ORAL_TABLET | Freq: Two times a day (BID) | ORAL | Status: AC

## 2015-03-29 NOTE — Progress Notes (Signed)
   Subjective:    Patient ID: Austin Woods, male    DOB: 19-Jun-1965, 49 y.o.   MRN: [id number]  Cough This is a recurrent problem. Episode onset: one month ago. Associated symptoms include ear pain, a fever, headaches, a sore throat and wheezing. Treatments tried: zpack, sinus med, tessalon.   zpk last go around , solids mid-level provider. Has had more than 2 weeks his symptoms steady pressure aching cheeks for head. Positive nasal discharge diminished energy. Was given Astelin and it seemed to make things worse    Review of Systems  Constitutional: Positive for fever.  HENT: Positive for ear pain and sore throat.   Respiratory: Positive for cough and wheezing.   Neurological: Positive for headaches.       Objective:   Physical Exam  Alert mild malaise. Frontal maxillary tenderness nasal congestion frank erythematous neck supple lungs clear. Heart regular in rhythm.      Assessment & Plan:  Impression sob acute rhinosinusitis plan Depo-Medrol injection. 14 days Augmentin symptom care discussed [facility]

## 2015-04-11 ENCOUNTER — Telehealth: Payer: Self-pay | Admitting: Family Medicine

## 2015-04-11 NOTE — Telephone Encounter (Signed)
[facility] for pt - need to know where he had original sleep study.   Also patient will need OV with Dr. Richardson Landry to discuss use of CPAP and benefit of PAP therapy.  (this is required documentation needed for [place] to be able to bill insurance for his CPAP supplies)

## 2015-04-11 NOTE — Telephone Encounter (Signed)
Discussed with pt, appt scheduled

## 2015-04-13 LAB — CUP PACEART REMOTE DEVICE CHECK
Date Time Interrogation Session: 20161220133714
MDC IDC SESS DTM: 20161120172326

## 2015-04-16 ENCOUNTER — Encounter: Payer: Self-pay | Admitting: Family Medicine

## 2015-04-16 ENCOUNTER — Ambulatory Visit (INDEPENDENT_AMBULATORY_CARE_PROVIDER_SITE_OTHER): Payer: BLUE CROSS/BLUE SHIELD | Admitting: Family Medicine

## 2015-04-16 VITALS — BP 132/86 | Ht 73.0 in | Wt 268.0 lb

## 2015-04-16 DIAGNOSIS — G4733 Obstructive sleep apnea (adult) (pediatric): Secondary | ICD-10-CM

## 2015-04-16 DIAGNOSIS — J019 Acute sinusitis, unspecified: Secondary | ICD-10-CM | POA: Diagnosis not present

## 2015-04-16 MED ORDER — CEFDINIR 300 MG PO CAPS: 300.0000 mg | ORAL_CAPSULE | Freq: Two times a day (BID) | ORAL | Status: DC

## 2015-04-16 MED ORDER — FLUCONAZOLE 150 MG PO TABS
ORAL_TABLET | ORAL | Status: DC
Start: 2015-04-16 — End: 2015-11-26

## 2015-04-16 NOTE — Progress Notes (Signed)
   Subjective:    Patient ID: Austin Woods, male    DOB: 1966-01-09, 50 y.o.   MRN: [id number]  HPI  Patient arrives to discuss CPAP. Patient states he needs a new CPAP mask with harness, hose and filters for his CPAP machine and the dr that was prescribing it retired suddenly.  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 Moderate nasal congestion plus minus frontal tenderness persists      Assessment & Plan:  Impression known sleep apnea. Plan will need to get sleep study from [facility] in order to qualify, prescription are a written, advanced home health waiting for this information [facility] also persistent sinus symptoms will cover with one more antibiotic

## 2015-04-17 ENCOUNTER — Encounter: Payer: Self-pay | Admitting: Cardiology

## 2015-04-17 ENCOUNTER — Telehealth: Payer: Self-pay | Admitting: Family Medicine

## 2015-04-17 NOTE — Telephone Encounter (Signed)
Patient requested copy of sleep study from [facility] hospital to be sent here for you to review.

## 2015-04-24 ENCOUNTER — Encounter: Payer: Self-pay | Admitting: Family Medicine

## 2015-04-25 ENCOUNTER — Ambulatory Visit (INDEPENDENT_AMBULATORY_CARE_PROVIDER_SITE_OTHER): Payer: BLUE CROSS/BLUE SHIELD | Admitting: *Deleted

## 2015-04-25 DIAGNOSIS — I480 Paroxysmal atrial fibrillation: Secondary | ICD-10-CM

## 2015-04-25 NOTE — Progress Notes (Signed)
Carelink Summary Report / Loop Recorder 

## 2015-05-27 ENCOUNTER — Ambulatory Visit (INDEPENDENT_AMBULATORY_CARE_PROVIDER_SITE_OTHER): Payer: BLUE CROSS/BLUE SHIELD | Admitting: *Deleted

## 2015-05-27 DIAGNOSIS — I480 Paroxysmal atrial fibrillation: Secondary | ICD-10-CM

## 2015-05-29 NOTE — Progress Notes (Signed)
Carelink Summary Report / Loop Recorder 

## 2015-06-03 ENCOUNTER — Other Ambulatory Visit: Payer: Self-pay | Admitting: *Deleted

## 2015-06-03 MED ORDER — RIVAROXABAN 20 MG PO TABS: 20.0000 mg | ORAL_TABLET | Freq: Every day | ORAL | Status: DC

## 2015-06-14 LAB — CUP PACEART REMOTE DEVICE CHECK: MDC IDC SESS DTM: 20170119140714

## 2015-06-24 ENCOUNTER — Ambulatory Visit (INDEPENDENT_AMBULATORY_CARE_PROVIDER_SITE_OTHER): Payer: BLUE CROSS/BLUE SHIELD | Admitting: *Deleted

## 2015-06-24 DIAGNOSIS — I480 Paroxysmal atrial fibrillation: Secondary | ICD-10-CM | POA: Diagnosis not present

## 2015-06-25 NOTE — Progress Notes (Signed)
Carelink Summary Report / Loop Recorder 

## 2015-07-01 ENCOUNTER — Other Ambulatory Visit: Payer: Self-pay | Admitting: *Deleted

## 2015-07-01 NOTE — Telephone Encounter (Signed)
metoprolol succinate (TOPROL-XL) 25 MG 24 hr tablet  Medication   Date: 01/28/2015  Department: [facility]  Ordering/Authorizing: [name], MD      Order Providers    Prescribing Provider Encounter Provider   [name], MD [name], MD    Medication Detail      Disp Refills Start End     metoprolol succinate (TOPROL-XL) 25 MG 24 hr tablet 180 tablet 0 01/28/2015     Sig: TAKE 1 TABLET TWICE A DAY    E-Prescribing Status: Receipt confirmed by pharmacy (01/28/2015 4:58 PM EDT)     Pharmacy    [facility] [place]         Needs to go to PCP

## 2015-07-04 ENCOUNTER — Other Ambulatory Visit: Payer: Self-pay | Admitting: *Deleted

## 2015-07-04 MED ORDER — ENALAPRIL MALEATE 10 MG PO TABS: 10.0000 mg | ORAL_TABLET | Freq: Every day | ORAL | Status: DC

## 2015-07-05 ENCOUNTER — Other Ambulatory Visit: Payer: Self-pay | Admitting: *Deleted

## 2015-07-05 MED ORDER — METOPROLOL SUCCINATE ER 25 MG PO TB24: 25.0000 mg | ORAL_TABLET | Freq: Two times a day (BID) | ORAL | Status: DC

## 2015-07-11 DIAGNOSIS — I10 Essential (primary) hypertension: Secondary | ICD-10-CM | POA: Diagnosis not present

## 2015-07-11 DIAGNOSIS — Z139 Encounter for screening, unspecified: Secondary | ICD-10-CM | POA: Diagnosis not present

## 2015-07-11 DIAGNOSIS — Z79899 Other long term (current) drug therapy: Secondary | ICD-10-CM | POA: Diagnosis not present

## 2015-07-11 DIAGNOSIS — E669 Obesity, unspecified: Secondary | ICD-10-CM | POA: Diagnosis not present

## 2015-07-11 DIAGNOSIS — Z6834 Body mass index (BMI) 34.0-34.9, adult: Secondary | ICD-10-CM | POA: Diagnosis not present

## 2015-07-11 DIAGNOSIS — E785 Hyperlipidemia, unspecified: Secondary | ICD-10-CM | POA: Diagnosis not present

## 2015-07-11 DIAGNOSIS — E559 Vitamin D deficiency, unspecified: Secondary | ICD-10-CM | POA: Diagnosis not present

## 2015-07-12 DIAGNOSIS — E1165 Type 2 diabetes mellitus with hyperglycemia: Secondary | ICD-10-CM | POA: Diagnosis not present

## 2015-07-12 DIAGNOSIS — Z794 Long term (current) use of insulin: Secondary | ICD-10-CM | POA: Diagnosis not present

## 2015-07-18 DIAGNOSIS — I10 Essential (primary) hypertension: Secondary | ICD-10-CM | POA: Diagnosis not present

## 2015-07-18 DIAGNOSIS — E559 Vitamin D deficiency, unspecified: Secondary | ICD-10-CM | POA: Diagnosis not present

## 2015-07-18 DIAGNOSIS — E785 Hyperlipidemia, unspecified: Secondary | ICD-10-CM | POA: Diagnosis not present

## 2015-07-18 DIAGNOSIS — R7989 Other specified abnormal findings of blood chemistry: Secondary | ICD-10-CM | POA: Diagnosis not present

## 2015-07-24 ENCOUNTER — Ambulatory Visit (INDEPENDENT_AMBULATORY_CARE_PROVIDER_SITE_OTHER): Payer: BLUE CROSS/BLUE SHIELD | Admitting: *Deleted

## 2015-07-24 DIAGNOSIS — I480 Paroxysmal atrial fibrillation: Secondary | ICD-10-CM | POA: Diagnosis not present

## 2015-07-24 NOTE — Progress Notes (Signed)
Carelink Summary Report / Loop Recorder 

## 2015-08-01 DIAGNOSIS — E669 Obesity, unspecified: Secondary | ICD-10-CM | POA: Diagnosis not present

## 2015-08-01 DIAGNOSIS — Z6834 Body mass index (BMI) 34.0-34.9, adult: Secondary | ICD-10-CM | POA: Diagnosis not present

## 2015-08-02 ENCOUNTER — Encounter: Payer: Self-pay | Admitting: Internal Medicine

## 2015-08-09 LAB — CUP PACEART REMOTE DEVICE CHECK: Date Time Interrogation Session: 20170218140748

## 2015-08-15 DIAGNOSIS — E669 Obesity, unspecified: Secondary | ICD-10-CM | POA: Diagnosis not present

## 2015-08-15 DIAGNOSIS — Z6834 Body mass index (BMI) 34.0-34.9, adult: Secondary | ICD-10-CM | POA: Diagnosis not present

## 2015-08-19 ENCOUNTER — Encounter: Payer: Self-pay | Admitting: Family Medicine

## 2015-08-19 ENCOUNTER — Ambulatory Visit (INDEPENDENT_AMBULATORY_CARE_PROVIDER_SITE_OTHER): Payer: BLUE CROSS/BLUE SHIELD | Admitting: Family Medicine

## 2015-08-19 VITALS — BP 120/82 | HR 56 | Ht 73.0 in | Wt 266.1 lb

## 2015-08-19 DIAGNOSIS — Z125 Encounter for screening for malignant neoplasm of prostate: Secondary | ICD-10-CM | POA: Diagnosis not present

## 2015-08-19 DIAGNOSIS — R5383 Other fatigue: Secondary | ICD-10-CM

## 2015-08-19 DIAGNOSIS — E785 Hyperlipidemia, unspecified: Secondary | ICD-10-CM | POA: Diagnosis not present

## 2015-08-19 DIAGNOSIS — Z79899 Other long term (current) drug therapy: Secondary | ICD-10-CM

## 2015-08-19 MED ORDER — POTASSIUM CHLORIDE ER 20 MEQ PO TBCR: 1.0000 | EXTENDED_RELEASE_TABLET | Freq: Every day | ORAL | Status: DC

## 2015-08-19 MED ORDER — FUROSEMIDE 40 MG PO TABS: 40.0000 mg | ORAL_TABLET | Freq: Every day | ORAL | Status: DC

## 2015-08-19 MED ORDER — HYDROCORTISONE 2.5 % RE CREA: 1.0000 "application " | TOPICAL_CREAM | Freq: Three times a day (TID) | RECTAL | Status: DC

## 2015-08-19 MED ORDER — ENALAPRIL MALEATE 10 MG PO TABS: 10.0000 mg | ORAL_TABLET | Freq: Every day | ORAL | Status: DC

## 2015-08-19 MED ORDER — METOPROLOL SUCCINATE ER 25 MG PO TB24: 25.0000 mg | ORAL_TABLET | Freq: Two times a day (BID) | ORAL | Status: DC

## 2015-08-19 NOTE — Patient Instructions (Signed)
clarittin one tab daily otc for allergies

## 2015-08-19 NOTE — Progress Notes (Signed)
   Subjective:    Patient ID: Austin Woods, male    DOB: April 12, 1965, 50 y.o.   MRN: [id number]  Hypertension This is a chronic problem. The current episode started more than 1 year ago. The problem has been gradually improving since onset. There are no associated agents to hypertension. There are no known risk factors for coronary artery disease. Treatments tried: enalapril, metoprolol. The current treatment provides moderate improvement. There are no compliance problems.    Patient states that he is having trouble with his hemorrhoids again and needs something prescribed.sometimess irritates and burns  cpap helping, sticks with it, no obv difficulties  Patient has some issues with allergies also.  Patient states that he is having trouble with his heart beating fast at times. He thinks this may be anxiety.  Compliant with blood pressure medication. Does not miss a dose. Watching salt intake.  Compliant with hyperlipidemia medicine. States overall handling it well. No obvious difficulties next  Followed by the cardiologist for atrial fibrillation Review of Systems No headache, no major weight loss or weight gain, no chest pain no back pain abdominal pain no change in bowel habits complete ROS otherwise negative     Objective:   Physical Exam  Alert vital stable blood pressure good on repeat HEENT normal lungs clear heart rhythm regular ankles without edema      Assessment & Plan:  Impression 1 hypertension good control discussed maintain same level meds #2 hyperlipidemia status uncertain the check blood work results discussed #3 recent significant leg cramps will add a magnesium #4 flare of irritated hemorrhoids patient has never had a colonoscopy for plan Anusol HC-1 cream when necessary rationale discussed, other medications refilled. Appropriate blood work diet exercise discussed. Given GI sheet encouraged to call and set up colonoscopy [facility]

## 2015-08-20 LAB — LIPID PANEL
CHOLESTEROL TOTAL: 156 mg/dL (ref 100–199)
Chol/HDL Ratio: 3.5 ratio units (ref 0.0–5.0)
HDL: 44 mg/dL (ref >39)
LDL Calculated: 88 mg/dL (ref 0–99)
TRIGLYCERIDES: 122 mg/dL (ref 0–149)
VLDL CHOLESTEROL CAL: 24 mg/dL (ref 5–40)

## 2015-08-20 LAB — HEPATIC FUNCTION PANEL
ALK PHOS: 54 IU/L (ref 39–117)
ALT: 21 IU/L (ref 0–44)
AST: 18 IU/L (ref 0–40)
Albumin: 4.5 g/dL (ref 3.5–5.5)
Bilirubin Total: 1.2 mg/dL (ref 0.0–1.2)
Bilirubin, Direct: 0.34 mg/dL (ref 0.00–0.40)
Total Protein: 6.9 g/dL (ref 6.0–8.5)

## 2015-08-20 LAB — BASIC METABOLIC PANEL
BUN / CREAT RATIO: 12 (ref 9–20)
BUN: 13 mg/dL (ref 6–24)
CHLORIDE: 104 mmol/L (ref 96–106)
CO2: 26 mmol/L (ref 18–29)
Calcium: 9.6 mg/dL (ref 8.7–10.2)
Creatinine, Ser: 1.09 mg/dL (ref 0.76–1.27)
GFR calc Af Amer: 90 mL/min/{1.73_m2} (ref >59)
GFR calc non Af Amer: 78 mL/min/{1.73_m2} (ref >59)
GLUCOSE: 124 mg/dL — AB (ref 65–99)
Potassium: 4.5 mmol/L (ref 3.5–5.2)
SODIUM: 145 mmol/L — AB (ref 134–144)

## 2015-08-20 LAB — MAGNESIUM: MAGNESIUM: 2.1 mg/dL (ref 1.6–2.3)

## 2015-08-20 LAB — PSA: PROSTATE SPECIFIC AG, SERUM: 0.3 ng/mL (ref 0.0–4.0)

## 2015-08-21 ENCOUNTER — Encounter: Payer: Self-pay | Admitting: Family Medicine

## 2015-08-21 ENCOUNTER — Other Ambulatory Visit (INDEPENDENT_AMBULATORY_CARE_PROVIDER_SITE_OTHER): Payer: Self-pay | Admitting: *Deleted

## 2015-08-21 DIAGNOSIS — Z1211 Encounter for screening for malignant neoplasm of colon: Secondary | ICD-10-CM

## 2015-08-23 ENCOUNTER — Ambulatory Visit (INDEPENDENT_AMBULATORY_CARE_PROVIDER_SITE_OTHER): Payer: BLUE CROSS/BLUE SHIELD | Admitting: *Deleted

## 2015-08-23 DIAGNOSIS — I480 Paroxysmal atrial fibrillation: Secondary | ICD-10-CM | POA: Diagnosis not present

## 2015-08-23 NOTE — Progress Notes (Signed)
Carelink Summary Report / Loop Recorder 

## 2015-08-28 ENCOUNTER — Ambulatory Visit (INDEPENDENT_AMBULATORY_CARE_PROVIDER_SITE_OTHER): Payer: BLUE CROSS/BLUE SHIELD | Admitting: Internal Medicine

## 2015-08-28 ENCOUNTER — Encounter: Payer: Self-pay | Admitting: Internal Medicine

## 2015-08-28 VITALS — BP 120/88 | HR 57 | Ht 73.0 in | Wt 266.4 lb

## 2015-08-28 DIAGNOSIS — I10 Essential (primary) hypertension: Secondary | ICD-10-CM

## 2015-08-28 DIAGNOSIS — I480 Paroxysmal atrial fibrillation: Secondary | ICD-10-CM | POA: Diagnosis not present

## 2015-08-28 LAB — CUP PACEART INCLINIC DEVICE CHECK: Date Time Interrogation Session: 20170524141020

## 2015-08-28 MED ORDER — FUROSEMIDE 40 MG PO TABS: 20.0000 mg | ORAL_TABLET | Freq: Every day | ORAL | Status: DC

## 2015-08-28 MED ORDER — METOPROLOL SUCCINATE ER 25 MG PO TB24: 25.0000 mg | ORAL_TABLET | Freq: Every day | ORAL | Status: DC

## 2015-08-28 NOTE — Patient Instructions (Signed)
Medication Instructions:  Your physician has recommended you make the following change in your medication:  1) Decrease Furosemide to 20 mg daily 2) Decrease Toprol to 25mg  daily   Labwork: None ordered   Testing/Procedures: None ordered   Follow-Up: Your physician recommends that you schedule a follow-up appointment in: 3 months with Roderic Palau, NP in afib clinic and 6 months with Dr Rayann Heman    Any Other Special Instructions Will Be Listed Below (If Applicable).     If you need a refill on your cardiac medications before your next appointment, please call your pharmacy.

## 2015-08-28 NOTE — Progress Notes (Signed)
PCP:  [name], MD Primary Cardiologist:  Bronson Ing Electrophysiologist: Dr. Rayann Heman  The patient presents today for routine electrophysiology follow up.  He has done very well since his last visit.   ILR reveals very little AF. He continues to lose weight (over 30 lbs so far!) and is making lifestyle modificaiton. Today, using CPAP recently and does admit makes him fell better. + postural dizziness, + leg cramping,  + soreness over ILR sight.    Past Medical History  Diagnosis Date  . Hypertension   . High cholesterol   . Asthma   . GERD (gastroesophageal reflux disease)   . History of DVT (deep vein thrombosis)     a. left leg following ablation for SVT  . Wolff-Parkinson-White (WPW) syndrome     a. s/p RFCA 2001 by Dr Caryl Comes, pt reports complicated by post procedure DVT  . Persistent atrial fibrillation     a. Dx 08/2012, xarelto initiated; b. 09/2012 Tikosyn initiated -> DCCV -> Sinus c. PVI 03/2013 d. PVI 03/2014  . Sleep apnea     does not use CPAP (he reports having uvulectomy)    Past Surgical History  Procedure Laterality Date  . Back surgery    . Tonsillectomy    . Stomach surgery      reflux, fundoplication  . Nose surgery      sleep apnea surgery  . Tee without cardioversion N/A 08/25/2012    Procedure: TRANSESOPHAGEAL ECHOCARDIOGRAM (TEE);  Surgeon: Thayer Headings, MD;  Location: [place];  Service: Cardiovascular;  Laterality: N/A;  . Cardioversion N/A 08/25/2012    Procedure: CARDIOVERSION;  Surgeon: Thayer Headings, MD;  Location: [facility] ENDOSCOPY;  Service: Cardiovascular;  Laterality: N/A;  . Cardioversion N/A 09/06/2012    Procedure: CARDIOVERSION/Bedside;  Surgeon: Carlena Bjornstad, MD;  Location: [place];  Service: Cardiovascular;  Laterality: N/A;  . Tee without cardioversion  03/23/2013    DR ROSS  . Atrial fibrillation ablation  03/23/2013    PVI by DR Rayann Heman for afib  . Tee without cardioversion N/A 03/23/2013    Procedure: TRANSESOPHAGEAL ECHOCARDIOGRAM  (TEE);  Surgeon: Fay Records, MD;  Location: [facility] ENDOSCOPY;  Service: Cardiovascular;  Laterality: N/A;  . Cardioversion N/A 01/12/2014    Procedure: CARDIOVERSION;  Surgeon: Pixie Casino, MD;  Location: [facility] ENDOSCOPY;  Service: Cardiovascular;  Laterality: N/A;  . Cardiac surgery    . Atrial fibrillation ablation N/A 03/23/2013    Procedure: ATRIAL FIBRILLATION ABLATION;  Surgeon: Coralyn Mark, MD;  Location: [place] CATH LAB;  Service: Cardiovascular;  Laterality: N/A;  . Tee without cardioversion N/A 03/26/2014    Procedure: TRANSESOPHAGEAL ECHOCARDIOGRAM (TEE);  Surgeon: Josue Hector, MD;  Location: [facility] ENDOSCOPY;  Service: Cardiovascular;  Laterality: N/A;  . Atrial fibrillation ablation N/A 03/27/2014    PVI Dr Rayann Heman    Current Outpatient Prescriptions  Medication Sig Dispense Refill  .  Current outpatient prescriptions:  .  albuterol (PROVENTIL HFA) 108 (90 BASE) MCG/ACT inhaler, Inhale 2 puffs into the lungs every 4 (four) hours as needed for wheezing or shortness of breath., Disp: , Rfl:  .  atorvastatin (LIPITOR) 10 MG tablet, Take 10 mg by mouth daily., Disp: , Rfl:  .  cefdinir (OMNICEF) 300 MG capsule, Take 1 capsule (300 mg total) by mouth 2 (two) times daily., Disp: 20 capsule, Rfl: 0 .  Cholecalciferol (VITAMIN D) 2000 UNITS CAPS, Take 1 capsule by mouth daily. , Disp: , Rfl:  .  enalapril (VASOTEC) 10 MG tablet,  Take 1 tablet (10 mg total) by mouth daily., Disp: 90 tablet, Rfl: 1 .  fluconazole (DIFLUCAN) 150 MG tablet, One p o three d apart, Disp: 2 tablet, Rfl: 0 .  Fluticasone-Salmeterol (ADVAIR DISKUS) 250-50 MCG/DOSE AEPB, Inhale 1 puff into the lungs every 12 (twelve) hours., Disp: , Rfl:  .  furosemide (LASIX) 40 MG tablet, Take 1 tablet (40 mg total) by mouth daily., Disp: 90 tablet, Rfl: 1 .  hydrocortisone (ANUSOL-HC) 2.5 % rectal cream, Place 1 application rectally 3 (three) times daily., Disp: 30 g, Rfl: 2 .  ketoconazole (NIZORAL) 2 % cream, Apply 1 application  topically 2 (two) times daily., Disp: 30 g, Rfl: 4 .  meloxicam (MOBIC) 15 MG tablet, Take 15 mg by mouth daily as needed for pain. , Disp: , Rfl:  .  metoprolol succinate (TOPROL-XL) 25 MG 24 hr tablet, Take 1 tablet (25 mg total) by mouth 2 (two) times daily., Disp: 180 tablet, Rfl: 1 .  omeprazole (PRILOSEC) 20 MG capsule, Take 20 mg by mouth daily., Disp: , Rfl:  .  oxybutynin (DITROPAN) 5 MG tablet, Take 1 tablet by mouth 2 (two) times daily as needed for bladder spasms. , Disp: , Rfl:  .  Potassium Chloride ER 20 MEQ TBCR, Take 1 tablet by mouth daily., Disp: 90 tablet, Rfl: 1 .  rivaroxaban (XARELTO) 20 MG TABS tablet, Take 1 tablet (20 mg total) by mouth daily with supper., Disp: 30 tablet, Rfl: 8 .  tadalafil (CIALIS) 5 MG tablet, Take 5 mg by mouth daily as needed for erectile dysfunction., Disp: , Rfl:  .  triamcinolone cream (KENALOG) 0.1 %, Apply 1 application topically 2 (two) times daily. Prn rash; use up to 2 weeks, Disp: 30 g, Rfl: 0 .  Vitamin D, Ergocalciferol, (DRISDOL) 50000 UNITS CAPS capsule, Reported on 08/19/2015, Disp: , Rfl:       .      .      .      .      .      .      .      .      .      .      .      .      .       No current facility-administered medications for this visit.   ROS- he has headaches chronically,dizziness, gerd, continues to gain weight, sinus pressure, itchy eyes, all systems are reviewed and negative except as per HPI above .  No Known Allergies  History   Social History  . Marital Status: Married    Spouse Name: N/A  . Number of Children: 2  . Years of Education: N/A   Occupational History  .  [employer]   Social History Main Topics  . Smoking status: Former Smoker    Quit date: 08/06/2010  . Smokeless tobacco: Never Used  . Alcohol Use: No  . Drug Use: No  . Sexual Activity: Not on file   Other Topics Concern  . Not on file   Social History Narrative   Pt lives in [place] with wife.  Works at [employer]  History  Problem Relation Age of Onset  . CAD Father 55  . Diabetes Father   . Heart attack Father   . Transient ischemic attack Father   . Vascular Disease Father   . Diabetes Mother   . Anemia Mother   .  Gout Mother   . Diabetes Brother   . Aneurysm Maternal Grandmother   . Stroke Maternal Grandfather   . Diabetes Paternal Grandmother   . Stroke Paternal Grandfather      Physical Exam:                   Filed Vitals:   08/28/15 1150  BP: 120/88  Pulse: 57   GEN- The patient is well appearing, alert and oriented x 3 today.   Head- normocephalic, atraumatic Eyes-  Sclera clear, conjunctiva pink Ears- hearing intact Oropharynx- clear Neck- supple,  Lungs- Clear to ausculation bilaterally, normal work of breathing Heart-Regular rate and rhythm, no murmurs, rubs or gallops, PMI not laterally displaced GI- soft, NT, ND, + BS Extremities- no clubbing, cyanosis, no edema MS- no significant deformity or atrophy Skin- no rash or lesion Psych- euthymic mood, full affect Neuro- strength and sensation are intact   ILR site is well healed Interrogation is reviewed  Assessment and Plan:  1. Persistent Afib/ 2nd PVI 12/22 Doing very well off of AAD therapy No changes today ILR reveals primarily artifact and sinus with PACs.  AF burden is 5%.    Reduce toprol to 25mg  daily  2. OSA Compliance with CPAP encouraged.  3. HTN Stable Given dizziness, will reduce lasix to 20mg  daily Reduce toprol to daily  4. Obesity Body mass index is Body mass index is 35.15 kg/(m^2).  I am pleased with his efforts.  Weight loss is strongly encouraged.  Follow-up in AF clinic in 3 monhts Return to see me in 6 months  Thompson Grayer MD, [facility] 08/28/2015 12:27 PM

## 2015-08-29 DIAGNOSIS — E669 Obesity, unspecified: Secondary | ICD-10-CM | POA: Diagnosis not present

## 2015-08-29 DIAGNOSIS — Z6834 Body mass index (BMI) 34.0-34.9, adult: Secondary | ICD-10-CM | POA: Diagnosis not present

## 2015-08-30 LAB — CUP PACEART REMOTE DEVICE CHECK: Date Time Interrogation Session: 20170320143530

## 2015-09-02 LAB — CUP PACEART REMOTE DEVICE CHECK: Date Time Interrogation Session: 20170419150716

## 2015-09-02 NOTE — Progress Notes (Signed)
Carelink summary report received. Battery status OK. Normal device function. No new symptom episodes, tachy episodes, brady, or pause episodes. 5.5% AF, +Xarelto. Monthly summary reports and ROV/PRN

## 2015-09-19 DIAGNOSIS — Z6834 Body mass index (BMI) 34.0-34.9, adult: Secondary | ICD-10-CM | POA: Diagnosis not present

## 2015-09-19 DIAGNOSIS — E669 Obesity, unspecified: Secondary | ICD-10-CM | POA: Diagnosis not present

## 2015-09-22 ENCOUNTER — Encounter (HOSPITAL_COMMUNITY): Payer: Self-pay | Admitting: Emergency Medicine

## 2015-09-22 DIAGNOSIS — J45909 Unspecified asthma, uncomplicated: Secondary | ICD-10-CM | POA: Diagnosis not present

## 2015-09-22 DIAGNOSIS — M79605 Pain in left leg: Secondary | ICD-10-CM | POA: Diagnosis not present

## 2015-09-22 DIAGNOSIS — Z87891 Personal history of nicotine dependence: Secondary | ICD-10-CM | POA: Diagnosis not present

## 2015-09-22 DIAGNOSIS — I10 Essential (primary) hypertension: Secondary | ICD-10-CM | POA: Insufficient documentation

## 2015-09-22 DIAGNOSIS — M79604 Pain in right leg: Secondary | ICD-10-CM | POA: Insufficient documentation

## 2015-09-22 NOTE — ED Notes (Signed)
Pt reports R leg edema that started on Fri.

## 2015-09-23 ENCOUNTER — Ambulatory Visit (HOSPITAL_COMMUNITY)
Admission: RE | Admit: 2015-09-23 | Discharge: 2015-09-23 | Disposition: A | Discharge status: Home / Self Care | Payer: BLUE CROSS/BLUE SHIELD | Source: Ambulatory Visit | Attending: Emergency Medicine | Admitting: Emergency Medicine

## 2015-09-23 ENCOUNTER — Emergency Department (HOSPITAL_COMMUNITY)
Admission: EM | Admit: 2015-09-23 | Discharge: 2015-09-23 | Disposition: A | Discharge status: Home / Self Care | Payer: BLUE CROSS/BLUE SHIELD | Attending: Emergency Medicine | Admitting: Emergency Medicine

## 2015-09-23 ENCOUNTER — Ambulatory Visit (INDEPENDENT_AMBULATORY_CARE_PROVIDER_SITE_OTHER): Payer: BLUE CROSS/BLUE SHIELD | Admitting: *Deleted

## 2015-09-23 DIAGNOSIS — M79662 Pain in left lower leg: Secondary | ICD-10-CM | POA: Diagnosis not present

## 2015-09-23 DIAGNOSIS — R52 Pain, unspecified: Secondary | ICD-10-CM

## 2015-09-23 DIAGNOSIS — I480 Paroxysmal atrial fibrillation: Secondary | ICD-10-CM | POA: Diagnosis not present

## 2015-09-23 DIAGNOSIS — M79606 Pain in leg, unspecified: Secondary | ICD-10-CM

## 2015-09-23 DIAGNOSIS — M79661 Pain in right lower leg: Secondary | ICD-10-CM | POA: Diagnosis not present

## 2015-09-23 LAB — BASIC METABOLIC PANEL
Anion gap: 6 (ref 5–15)
BUN: 17 mg/dL (ref 6–20)
CHLORIDE: 103 mmol/L (ref 101–111)
CO2: 28 mmol/L (ref 22–32)
CREATININE: 1.07 mg/dL (ref 0.61–1.24)
Calcium: 8.9 mg/dL (ref 8.9–10.3)
GFR calc Af Amer: >60 mL/min (ref >60)
GFR calc non Af Amer: >60 mL/min (ref >60)
Glucose, Bld: 108 mg/dL — ABNORMAL HIGH (ref 65–99)
Potassium: 3.7 mmol/L (ref 3.5–5.1)
Sodium: 137 mmol/L (ref 135–145)

## 2015-09-23 MED ORDER — HYDROCODONE-ACETAMINOPHEN 5-325 MG PO TABS: 1.0000 | ORAL_TABLET | Freq: Four times a day (QID) | ORAL | Status: DC | PRN

## 2015-09-23 MED ORDER — HYDROCODONE-ACETAMINOPHEN 5-325 MG PO TABS
1.0000 | ORAL_TABLET | Freq: Once | ORAL | Status: AC
  Administered 2015-09-23: 1 via ORAL
  Filled 2015-09-23: qty 1

## 2015-09-23 NOTE — Progress Notes (Signed)
Carelink Summary Report / Loop Recorder 

## 2015-09-23 NOTE — ED Provider Notes (Signed)
   1005 pt seen yesterday for leg pain and swelling.   Pt returned this am for venous US of BLE.  Negative for DVT's.  Pt informed of results.  Agrees to f/u with PMD.  Ambulated from the dept with steady gait.    Kem Parkinson, PA-C 09/23/15 [place], MD 09/23/15 1302

## 2015-09-23 NOTE — ED Provider Notes (Signed)
CSN: [id number]     Arrival date & time 09/22/15  2223 History   First MD Initiated Contact with Patient 09/23/15 0202     Chief Complaint  Patient presents with  . Leg Swelling     (Consider location/radiation/quality/duration/timing/severity/associated sxs/prior Treatment) HPI  This a 50 year old male who presents with right greater than left leg pain. He has a history of DVT he's currently on Xarelto. He reports that since Friday has had increasing right greater than left pain in the leg. He is taking Tylenol home without relief. He reports that he feels "like when I had my DVT." He also noted swelling. No redness noted. No new injury. Current pain is 10 out of 10. Pain is sometimes worse with range of motion. Describes the pain also as crampy like.  Past Medical History  Diagnosis Date  . Hypertension   . High cholesterol   . Asthma   . GERD (gastroesophageal reflux disease)   . History of DVT (deep vein thrombosis)     a. left leg following ablation for SVT  . Wolff-Parkinson-White (WPW) syndrome     a. s/p RFCA 2001 by Dr Caryl Comes, pt reports complicated by post procedure DVT  . Persistent atrial fibrillation ([place])     a. Dx 08/2012, xarelto initiated; b. 09/2012 Tikosyn initiated -> DCCV -> Sinus c. PVI 03/2013 d. PVI 03/2014  . Sleep apnea     does not use CPAP (he reports having uvulectomy)    Past Surgical History  Procedure Laterality Date  . Back surgery    . Tonsillectomy    . Stomach surgery      reflux, fundoplication  . Nose surgery      sleep apnea surgery  . Tee without cardioversion N/A 08/25/2012    Procedure: TRANSESOPHAGEAL ECHOCARDIOGRAM (TEE);  Surgeon: Thayer Headings, MD;  Location: [place];  Service: Cardiovascular;  Laterality: N/A;  . Cardioversion N/A 08/25/2012    Procedure: CARDIOVERSION;  Surgeon: Thayer Headings, MD;  Location: [facility] ENDOSCOPY;  Service: Cardiovascular;  Laterality: N/A;  . Cardioversion N/A 09/06/2012    Procedure:  CARDIOVERSION/Bedside;  Surgeon: Carlena Bjornstad, MD;  Location: [place];  Service: Cardiovascular;  Laterality: N/A;  . Tee without cardioversion  03/23/2013    DR ROSS  . Atrial fibrillation ablation  03/23/2013    PVI by DR Rayann Heman for afib  . Tee without cardioversion N/A 03/23/2013    Procedure: TRANSESOPHAGEAL ECHOCARDIOGRAM (TEE);  Surgeon: Fay Records, MD;  Location: [facility] ENDOSCOPY;  Service: Cardiovascular;  Laterality: N/A;  . Cardioversion N/A 01/12/2014    Procedure: CARDIOVERSION;  Surgeon: Pixie Casino, MD;  Location: [facility] ENDOSCOPY;  Service: Cardiovascular;  Laterality: N/A;  . Cardiac surgery    . Atrial fibrillation ablation N/A 03/23/2013    Procedure: ATRIAL FIBRILLATION ABLATION;  Surgeon: Coralyn Mark, MD;  Location: [place] CATH LAB;  Service: Cardiovascular;  Laterality: N/A;  . Tee without cardioversion N/A 03/26/2014    Procedure: TRANSESOPHAGEAL ECHOCARDIOGRAM (TEE);  Surgeon: Josue Hector, MD;  Location: [facility] ENDOSCOPY;  Service: Cardiovascular;  Laterality: N/A;  . Atrial fibrillation ablation N/A 03/27/2014    PVI Dr Rayann Heman  . Loop recorder implant N/A 06/29/2014    Procedure: LOOP RECORDER IMPLANT;  Surgeon: Thompson Grayer, MD;  Location: [facility] CATH LAB;  Service: Cardiovascular;  Laterality: N/A;   Family History  Problem Relation Age of Onset  . CAD Father 75  . Diabetes Father   . Heart attack Father   .  Transient ischemic attack Father   . Vascular Disease Father   . Diabetes Mother   . Anemia Mother   . Gout Mother   . Diabetes Brother   . Aneurysm Maternal Grandmother   . Stroke Maternal Grandfather   . Diabetes Paternal Grandmother   . Stroke Paternal Grandfather    Social History  Substance Use Topics  . Smoking status: Former Smoker -- 1.00 packs/day for 7 years    Types: Cigarettes    Start date: 05/19/2003    Quit date: 08/06/2010  . Smokeless tobacco: Never Used  . Alcohol Use: No    Review of Systems  Constitutional: Negative for fever.   Cardiovascular: Positive for leg swelling.  Musculoskeletal:       Right leg pain  Skin: Negative for color change and wound.  All other systems reviewed and are negative.     Allergies  Pollen extract  Home Medications   Prior to Admission medications   Medication Sig Start Date End Date Taking? Authorizing Provider  albuterol (PROVENTIL HFA) 108 (90 BASE) MCG/ACT inhaler Inhale 2 puffs into the lungs every 4 (four) hours as needed for wheezing or shortness of breath.    Historical Provider, MD  atorvastatin (LIPITOR) 10 MG tablet Take 10 mg by mouth daily.    Historical Provider, MD  cefdinir (OMNICEF) 300 MG capsule Take 1 capsule (300 mg total) by mouth 2 (two) times daily. 04/16/15   [name], MD  Cholecalciferol (VITAMIN D) 2000 UNITS CAPS Take 1 capsule by mouth daily.     Historical Provider, MD  enalapril (VASOTEC) 10 MG tablet Take 1 tablet (10 mg total) by mouth daily. 08/19/15   [name], MD  fluconazole (DIFLUCAN) 150 MG tablet One p o three d apart 04/16/15   [name], MD  Fluticasone-Salmeterol (ADVAIR DISKUS) 250-50 MCG/DOSE AEPB Inhale 1 puff into the lungs every 12 (twelve) hours.    Historical Provider, MD  furosemide (LASIX) 40 MG tablet Take 0.5 tablets (20 mg total) by mouth daily. 08/28/15   Thompson Grayer, MD  HYDROcodone-acetaminophen (NORCO/VICODIN) 5-325 MG tablet Take 1 tablet by mouth every 6 (six) hours as needed for moderate pain. 09/23/15   Merryl Hacker, MD  hydrocortisone (ANUSOL-HC) 2.5 % rectal cream Place 1 application rectally 3 (three) times daily. 08/19/15   [name], MD  ketoconazole (NIZORAL) 2 % cream Apply 1 application topically 2 (two) times daily. 02/01/15   Nilda Simmer, NP  meloxicam (MOBIC) 15 MG tablet Take 15 mg by mouth daily as needed for pain.  12/08/12   Thompson Grayer, MD  metoprolol succinate (TOPROL-XL) 25 MG 24 hr tablet Take 1 tablet (25 mg total) by mouth daily. 08/28/15   Thompson Grayer, MD   omeprazole (PRILOSEC) 20 MG capsule Take 20 mg by mouth daily.    Historical Provider, MD  oxybutynin (DITROPAN) 5 MG tablet Take 1 tablet by mouth 2 (two) times daily as needed for bladder spasms.  05/04/13   Historical Provider, MD  Potassium Chloride ER 20 MEQ TBCR Take 1 tablet by mouth daily. 08/19/15   [name], MD  rivaroxaban (XARELTO) 20 MG TABS tablet Take 1 tablet (20 mg total) by mouth daily with supper. 06/03/15   Thompson Grayer, MD  tadalafil (CIALIS) 5 MG tablet Take 5 mg by mouth daily as needed for erectile dysfunction.    Historical Provider, MD  triamcinolone cream (KENALOG) 0.1 % Apply 1 application topically 2 (two) times  daily. Prn rash; use up to 2 weeks 02/01/15   Nilda Simmer, NP  Vitamin D, Ergocalciferol, (DRISDOL) 50000 UNITS CAPS capsule Reported on 08/19/2015 02/18/15   Historical Provider, MD   BP 113/68 mmHg  Pulse 52  Temp(Src) 98.2 F (36.8 C) (Oral)  Resp 16  Ht 6\' 1"  (1.854 m)  Wt 264 lb (119.75 kg)  BMI 34.84 kg/m2  SpO2 97% Physical Exam  Constitutional: He is oriented to person, place, and time. He appears well-developed and well-nourished.  HENT:  Head: Normocephalic and atraumatic.  Cardiovascular: Normal rate, regular rhythm and normal heart sounds.   No murmur heard. Pulmonary/Chest: Effort normal and breath sounds normal. No respiratory distress. He has no wheezes.  Musculoskeletal: Normal range of motion.  No obvious swelling noted bilaterally, crepitus of the right knee, no overlying erythema or effusion noted, there is right calf tenderness without overlying skin changes  Neurological: He is alert and oriented to person, place, and time.  Skin: Skin is warm and dry.  Psychiatric: He has a normal mood and affect.  Nursing note and vitals reviewed.   ED Course  Procedures (including critical care time) Labs Review Labs Reviewed  BASIC METABOLIC PANEL - Abnormal; Notable for the following:    Glucose, Bld 108 (*)    All other  components within normal limits    Imaging Review No results found. I have personally reviewed and evaluated these images and lab results as part of my medical decision-making.   EKG Interpretation None      MDM   Final diagnoses:  Pain of lower extremity, unspecified laterality    Patient presents with pain in the right leg greater than the left. History DVT but is on Xarelto. No objective signs of infection or swelling. He has tenderness of the right calf and pain with range of motion as well as crepitus of the right knee. This could be arthritic. Patient was given pain medication. Given the cramping nature pain and the fact that the patient takes potassium supplementation, will obtain metabolic panel to evaluate for potassium derangement. This is reassuring. While it would be unlikely for the patient developed a DVT on Xarelto, will bring the patient back for study later today. Short course of pain medication at home as patient does not take NSAIDs because he is on blood thinner.  After history, exam, and medical workup I feel the patient has been appropriately medically screened and is safe for discharge home. Pertinent diagnoses were discussed with the patient. Patient was given return precautions.     Merryl Hacker, MD 09/23/15 [phone]

## 2015-09-23 NOTE — Discharge Instructions (Signed)
You were seen today for leg pain. The cause of your pain is unknown. You need to return later today for ultrasound imaging. It may be related to arthritis in your leg. He'll be given a very short course of pain medication given that you cannot take NSAIDs.

## 2015-10-02 LAB — CUP PACEART REMOTE DEVICE CHECK
Date Time Interrogation Session: 20170519153608
MDC IDC SESS DTM: 20170618153547

## 2015-10-11 ENCOUNTER — Other Ambulatory Visit (INDEPENDENT_AMBULATORY_CARE_PROVIDER_SITE_OTHER): Payer: Self-pay | Admitting: *Deleted

## 2015-10-11 ENCOUNTER — Encounter (INDEPENDENT_AMBULATORY_CARE_PROVIDER_SITE_OTHER): Payer: Self-pay | Admitting: *Deleted

## 2015-10-11 NOTE — Telephone Encounter (Signed)
Patient needs trilyte 

## 2015-10-14 DIAGNOSIS — Z794 Long term (current) use of insulin: Secondary | ICD-10-CM | POA: Diagnosis not present

## 2015-10-14 DIAGNOSIS — E1165 Type 2 diabetes mellitus with hyperglycemia: Secondary | ICD-10-CM | POA: Diagnosis not present

## 2015-10-14 MED ORDER — PEG 3350-KCL-NA BICARB-NACL 420 G PO SOLR: 4000.0000 mL | Freq: Once | ORAL | Status: DC

## 2015-10-17 DIAGNOSIS — E669 Obesity, unspecified: Secondary | ICD-10-CM | POA: Diagnosis not present

## 2015-10-17 DIAGNOSIS — Z6834 Body mass index (BMI) 34.0-34.9, adult: Secondary | ICD-10-CM | POA: Diagnosis not present

## 2015-10-22 ENCOUNTER — Ambulatory Visit (INDEPENDENT_AMBULATORY_CARE_PROVIDER_SITE_OTHER): Payer: BLUE CROSS/BLUE SHIELD | Admitting: *Deleted

## 2015-10-22 DIAGNOSIS — I480 Paroxysmal atrial fibrillation: Secondary | ICD-10-CM

## 2015-10-22 NOTE — Progress Notes (Signed)
Carelink Summary Report / Loop Recorder 

## 2015-10-24 ENCOUNTER — Telehealth: Payer: Self-pay | Admitting: Cardiology

## 2015-10-24 NOTE — Telephone Encounter (Signed)
LMOVM requesting that pt send manual transmission b/c home monitor has not updated in at least 14 days.    

## 2015-11-01 ENCOUNTER — Other Ambulatory Visit: Payer: Self-pay | Admitting: Family Medicine

## 2015-11-01 ENCOUNTER — Telehealth: Payer: Self-pay | Admitting: Family Medicine

## 2015-11-01 NOTE — Telephone Encounter (Signed)
Patient stated he did get refills from cardiology and to disregard message

## 2015-11-01 NOTE — Telephone Encounter (Signed)
Strange , rxed in the spring by card with six mo refills, I dont mind [place] writin six mo worth

## 2015-11-01 NOTE — Telephone Encounter (Signed)
This med has not been prescribed by [place] and has been prescribe by his cardiology. Last given 90 day supply by cardiology 08/2015

## 2015-11-01 NOTE — Telephone Encounter (Signed)
Patients medication was denied by our office, furosemide (LASIX) 40 MG tablet.  He was told by the pharmacy to call our office.  Please advise.     [facility]

## 2015-11-07 DIAGNOSIS — Z6834 Body mass index (BMI) 34.0-34.9, adult: Secondary | ICD-10-CM | POA: Diagnosis not present

## 2015-11-07 DIAGNOSIS — E669 Obesity, unspecified: Secondary | ICD-10-CM | POA: Diagnosis not present

## 2015-11-13 ENCOUNTER — Telehealth (INDEPENDENT_AMBULATORY_CARE_PROVIDER_SITE_OTHER): Payer: Self-pay | Admitting: *Deleted

## 2015-11-13 NOTE — Telephone Encounter (Signed)
Referring MD/PCP: steve luking   Procedure: tcs  Reason/Indication:  screening  Has patient had this procedure before?  no  If so, when, by whom and where?    Is there a family history of colon cancer?  no  Who?  What age when diagnosed?    Is patient diabetic?   no      Does patient have prosthetic heart valve or mechanical valve?  no  Do you have a pacemaker?  no  Has patient ever had endocarditis? no  Has patient had joint replacement within last 12 months?  no  Does patient tend to be constipated or take laxatives? no  Does patient have a history of alcohol/drug use?  no  Is patient on Coumadin, Plavix and/or Aspirin? yes  Medications: see epic  Allergies: nkda  Medication Adjustment: Xarelto 2 days before  Procedure date & time: 12/04/15 at 730

## 2015-11-14 ENCOUNTER — Telehealth: Payer: Self-pay | Admitting: Internal Medicine

## 2015-11-14 LAB — CUP PACEART REMOTE DEVICE CHECK: Date Time Interrogation Session: 20170718163613

## 2015-11-14 NOTE — Telephone Encounter (Signed)
New message   Request for surgical clearance:  1. What type of surgery is being performed? Colonoscopy  2. When is this surgery scheduled? 12/04/15  3. Are there any medications that need to be held prior to surgery and how long? Xarelto 2 days before  4. Name of physician performing surgery?  5. Dr.Rehman  What is your office phone and fax number? [phone] and fax [phone]  Lacretia Nicks said you can send the clearance information back through [facility]

## 2015-11-20 NOTE — Telephone Encounter (Signed)
Pt takes Xarelto for afib, CHADS2 score of 2 (HTN, HF), no history of stroke. Has had history of DVT in left leg following ablation for SVT more than 3 years ago. Ok to hold Xarelto x2 days prior to colonoscopy. Clearance routed to [employer].

## 2015-11-20 NOTE — Telephone Encounter (Signed)
Patient aware.

## 2015-11-20 NOTE — Telephone Encounter (Signed)
agree

## 2015-11-21 ENCOUNTER — Ambulatory Visit (INDEPENDENT_AMBULATORY_CARE_PROVIDER_SITE_OTHER): Payer: BLUE CROSS/BLUE SHIELD | Admitting: *Deleted

## 2015-11-21 ENCOUNTER — Other Ambulatory Visit: Payer: Self-pay | Admitting: Family Medicine

## 2015-11-21 DIAGNOSIS — I480 Paroxysmal atrial fibrillation: Secondary | ICD-10-CM | POA: Diagnosis not present

## 2015-11-21 NOTE — Progress Notes (Signed)
Carelink Summary Report / Loop Recorder 

## 2015-11-22 ENCOUNTER — Other Ambulatory Visit: Payer: Self-pay | Admitting: Internal Medicine

## 2015-11-22 ENCOUNTER — Other Ambulatory Visit: Payer: Self-pay | Admitting: Family Medicine

## 2015-11-23 ENCOUNTER — Other Ambulatory Visit: Payer: Self-pay | Admitting: Family Medicine

## 2015-11-25 ENCOUNTER — Encounter: Payer: Self-pay | Admitting: Internal Medicine

## 2015-11-25 ENCOUNTER — Other Ambulatory Visit: Payer: Self-pay | Admitting: Family Medicine

## 2015-11-25 NOTE — Telephone Encounter (Signed)
b blocker rxed by card and this challenge with no med avail needs to be addressed by them, refill pot if we rxed times six mo, if we did not rx, let card ref this also

## 2015-11-26 ENCOUNTER — Encounter (HOSPITAL_COMMUNITY): Payer: Self-pay | Admitting: Nurse Practitioner

## 2015-11-26 ENCOUNTER — Other Ambulatory Visit: Payer: Self-pay

## 2015-11-26 ENCOUNTER — Ambulatory Visit (HOSPITAL_COMMUNITY)
Admission: RE | Admit: 2015-11-26 | Discharge: 2015-11-26 | Disposition: A | Discharge status: Home / Self Care | Payer: BLUE CROSS/BLUE SHIELD | Source: Ambulatory Visit | Attending: Nurse Practitioner | Admitting: Nurse Practitioner

## 2015-11-26 ENCOUNTER — Other Ambulatory Visit: Payer: Self-pay | Admitting: Family Medicine

## 2015-11-26 VITALS — BP 142/90 | HR 89 | Ht 73.0 in | Wt 265.0 lb

## 2015-11-26 DIAGNOSIS — I481 Persistent atrial fibrillation: Secondary | ICD-10-CM | POA: Insufficient documentation

## 2015-11-26 DIAGNOSIS — Z8249 Family history of ischemic heart disease and other diseases of the circulatory system: Secondary | ICD-10-CM | POA: Diagnosis not present

## 2015-11-26 DIAGNOSIS — Z6834 Body mass index (BMI) 34.0-34.9, adult: Secondary | ICD-10-CM | POA: Diagnosis not present

## 2015-11-26 DIAGNOSIS — Z79899 Other long term (current) drug therapy: Secondary | ICD-10-CM | POA: Diagnosis not present

## 2015-11-26 DIAGNOSIS — Z833 Family history of diabetes mellitus: Secondary | ICD-10-CM | POA: Diagnosis not present

## 2015-11-26 DIAGNOSIS — E669 Obesity, unspecified: Secondary | ICD-10-CM | POA: Diagnosis not present

## 2015-11-26 DIAGNOSIS — I10 Essential (primary) hypertension: Secondary | ICD-10-CM | POA: Insufficient documentation

## 2015-11-26 DIAGNOSIS — G4733 Obstructive sleep apnea (adult) (pediatric): Secondary | ICD-10-CM | POA: Diagnosis not present

## 2015-11-26 DIAGNOSIS — Z87891 Personal history of nicotine dependence: Secondary | ICD-10-CM | POA: Diagnosis not present

## 2015-11-26 DIAGNOSIS — Z86718 Personal history of other venous thrombosis and embolism: Secondary | ICD-10-CM | POA: Diagnosis not present

## 2015-11-26 DIAGNOSIS — I480 Paroxysmal atrial fibrillation: Secondary | ICD-10-CM | POA: Diagnosis not present

## 2015-11-26 DIAGNOSIS — J45909 Unspecified asthma, uncomplicated: Secondary | ICD-10-CM | POA: Diagnosis not present

## 2015-11-26 DIAGNOSIS — Z823 Family history of stroke: Secondary | ICD-10-CM | POA: Insufficient documentation

## 2015-11-26 DIAGNOSIS — I4891 Unspecified atrial fibrillation: Secondary | ICD-10-CM | POA: Diagnosis present

## 2015-11-26 DIAGNOSIS — K219 Gastro-esophageal reflux disease without esophagitis: Secondary | ICD-10-CM | POA: Diagnosis not present

## 2015-11-26 DIAGNOSIS — Z7901 Long term (current) use of anticoagulants: Secondary | ICD-10-CM | POA: Diagnosis not present

## 2015-11-26 MED ORDER — POTASSIUM CHLORIDE ER 20 MEQ PO TBCR: 1.0000 | EXTENDED_RELEASE_TABLET | Freq: Every day | ORAL | 1 refills | Status: DC

## 2015-11-26 NOTE — Progress Notes (Signed)
PCP:  No primary care provider on file. Primary Cardiologist:  Bronson Ing  The patient presents today to afib clinic with h/oPVI 12/22. Review of linq from end of July shows afib burden at 1.8 %, last seen by by Dr. Rayann Heman in May at which time, pt had lost 30 lbs. His weight is down one additional pound since then. He does not feel well today and EKG shows afib, rate controlled. He works second shift and got up early for this appointment and does not take his BB until noon. He has not been in afib for some time.  Has not missed any blood thinners.  Using CPAP recently and does admit makes him fell better. Less  issues with LLE since PVI.   Positive today for fatigue, lightheadedness.  Past Medical History:  Diagnosis Date  . Asthma   . GERD (gastroesophageal reflux disease)   . High cholesterol   . History of DVT (deep vein thrombosis)    a. left leg following ablation for SVT  . Hypertension   . Persistent atrial fibrillation ([place])    a. Dx 08/2012, xarelto initiated; b. 09/2012 Tikosyn initiated -> DCCV -> Sinus c. PVI 03/2013 d. PVI 03/2014  . Sleep apnea    does not use CPAP (he reports having uvulectomy)   . Wolff-Parkinson-White (WPW) syndrome    a. s/p RFCA 2001 by Dr Caryl Comes, pt reports complicated by post procedure DVT   Past Surgical History:  Procedure Laterality Date  . ATRIAL FIBRILLATION ABLATION  03/23/2013   PVI by DR Rayann Heman for afib  . ATRIAL FIBRILLATION ABLATION N/A 03/23/2013   Procedure: ATRIAL FIBRILLATION ABLATION;  Surgeon: Coralyn Mark, MD;  Location: [place] CATH LAB;  Service: Cardiovascular;  Laterality: N/A;  . ATRIAL FIBRILLATION ABLATION N/A 03/27/2014   PVI Dr Rayann Heman  . BACK SURGERY    . CARDIAC SURGERY    . CARDIOVERSION N/A 08/25/2012   Procedure: CARDIOVERSION;  Surgeon: Thayer Headings, MD;  Location: [facility] ENDOSCOPY;  Service: Cardiovascular;  Laterality: N/A;  . CARDIOVERSION N/A 09/06/2012   Procedure: CARDIOVERSION/Bedside;  Surgeon: Carlena Bjornstad, MD;  Location: [place];  Service: Cardiovascular;  Laterality: N/A;  . CARDIOVERSION N/A 01/12/2014   Procedure: CARDIOVERSION;  Surgeon: Pixie Casino, MD;  Location: [facility] ENDOSCOPY;  Service: Cardiovascular;  Laterality: N/A;  . LOOP RECORDER IMPLANT N/A 06/29/2014   Procedure: LOOP RECORDER IMPLANT;  Surgeon: Thompson Grayer, MD;  Location: [facility] CATH LAB;  Service: Cardiovascular;  Laterality: N/A;  . NOSE SURGERY     sleep apnea surgery  . STOMACH SURGERY     reflux, fundoplication  . TEE WITHOUT CARDIOVERSION N/A 08/25/2012   Procedure: TRANSESOPHAGEAL ECHOCARDIOGRAM (TEE);  Surgeon: Thayer Headings, MD;  Location: [place];  Service: Cardiovascular;  Laterality: N/A;  . TEE WITHOUT CARDIOVERSION  03/23/2013   DR ROSS  . TEE WITHOUT CARDIOVERSION N/A 03/23/2013   Procedure: TRANSESOPHAGEAL ECHOCARDIOGRAM (TEE);  Surgeon: Fay Records, MD;  Location: [facility] ENDOSCOPY;  Service: Cardiovascular;  Laterality: N/A;  . TEE WITHOUT CARDIOVERSION N/A 03/26/2014   Procedure: TRANSESOPHAGEAL ECHOCARDIOGRAM (TEE);  Surgeon: Josue Hector, MD;  Location: [facility] ENDOSCOPY;  Service: Cardiovascular;  Laterality: N/A;  . TONSILLECTOMY      Current Outpatient Prescriptions  Medication Sig Dispense Refill  . albuterol (PROVENTIL HFA) 108 (90 BASE) MCG/ACT inhaler Inhale 2 puffs into the lungs every 4 (four) hours as needed for wheezing or shortness of breath.    Marland Kitchen atorvastatin (LIPITOR) 10  MG tablet Take 10 mg by mouth daily.    . Cholecalciferol (VITAMIN D) 2000 UNITS CAPS Take 2 capsules by mouth daily.     . enalapril (VASOTEC) 10 MG tablet Take 1 tablet by mouth  daily 90 tablet 0  . Fluticasone-Salmeterol (ADVAIR DISKUS) 250-50 MCG/DOSE AEPB Inhale 1 puff into the lungs every 12 (twelve) hours.    . furosemide (LASIX) 40 MG tablet Take 1 tablet by mouth  daily 90 tablet 0  . HYDROcodone-acetaminophen (NORCO/VICODIN) 5-325 MG tablet Take 1 tablet by mouth every 6 (six) hours as needed for moderate  pain. 10 tablet 0  . ketoconazole (NIZORAL) 2 % cream Apply 1 application topically 2 (two) times daily. 30 g 4  . meloxicam (MOBIC) 15 MG tablet Take 15 mg by mouth daily as needed for pain.     . metoprolol succinate (TOPROL-XL) 25 MG 24 hr tablet Take 1 tablet (25 mg total) by mouth daily. 180 tablet 1  . omeprazole (PRILOSEC) 20 MG capsule Take 20 mg by mouth daily.    Marland Kitchen oxybutynin (DITROPAN) 5 MG tablet Take 2.5 mg by mouth 2 (two) times daily as needed for bladder spasms.     . polyethylene glycol-electrolytes (TRILYTE) 420 g solution Take 4,000 mLs by mouth once. 4000 mL 0  . Potassium Chloride ER 20 MEQ TBCR Take 1 tablet by mouth daily. 90 tablet 1  . PROCTOZONE-HC 2.5 % rectal cream Apply 3 times daily 90 g 3  . rivaroxaban (XARELTO) 20 MG TABS tablet Take 1 tablet (20 mg total) by mouth daily with supper. 90 tablet 2  . tadalafil (CIALIS) 5 MG tablet Take 5 mg by mouth daily as needed for erectile dysfunction.    . triamcinolone cream (KENALOG) 0.1 % Apply 1 application topically 2 (two) times daily. Prn rash; use up to 2 weeks 30 g 0   No current facility-administered medications for this encounter.    ROS- he has headaches chronically,dizziness, gerd, continues to gain weight, sinus pressure, itchy eyes, all systems are reviewed and negative except as per HPI above .  Allergies  Allergen Reactions  . Pollen Extract     Runny nose Watery eyes Nasal congestion    Social History   Social History  . Marital status: Married    Spouse name: N/A  . Number of children: 2  . Years of education: N/A   Occupational History  .  [employer]   Social History Main Topics  . Smoking status: Former Smoker    Packs/day: 1.00    Years: 7.00    Types: Cigarettes    Start date: 05/19/2003    Quit date: 08/06/2010  . Smokeless tobacco: Never Used  . Alcohol use No  . Drug use: No  . Sexual activity: Not on file   Other Topics Concern  . Not on file   Social History Narrative    Pt lives in [place] with wife.  Works at [employer] History  Problem Relation Age of Onset  . CAD Father 74  . Diabetes Father   . Heart attack Father   . Transient ischemic attack Father   . Vascular Disease Father   . Diabetes Mother   . Anemia Mother   . Gout Mother   . Diabetes Brother   . Aneurysm Maternal Grandmother   . Stroke Maternal Grandfather   . Diabetes Paternal Grandmother   . Stroke Paternal Grandfather      Physical Exam: Vitals:  11/26/15 0918  BP: (!) 142/90  BP Location: Left Arm  Patient Position: Sitting  Cuff Size: Normal  Pulse: 89  Weight: 265 lb (120.2 kg)  Height: 6\' 1"  (1.854 m)    GEN- The patient is well appearing, alert and oriented x 3 today.   Head- normocephalic, atraumatic Eyes-  Sclera clear, conjunctiva pink Ears- hearing intact Oropharynx- clear Neck- supple, no JVP Lymph- no cervical lymphadenopathy Lungs- Clear to ausculation bilaterally, normal work of breathing Heart-Irregular rate and rhythm, no murmurs, rubs or gallops, PMI not laterally displaced GI- soft, NT, ND, + BS Extremities- no clubbing, cyanosis, trace edema MS- no significant deformity or atrophy Skin- no rash or lesion Psych- euthymic mood, full affect Neuro- strength and sensation are intact  ekg today reveals afib at 89 bpm. qrs int 86 ms, qtc 433 ms Epic records reviewed   Assessment and Plan:  1. Persistent Afib/ 2nd PVI 12/22  Has failed  amiodarone , DCCV, flecainide and tikosyn.  In afib today, rate controlled, but afib burden overall low with last Linq report He will go home and take BB and go back to bed.  Continue Xarelto 20 mg bid.  2. OSA Compliance with CPAP encouraged.  4. Obesity Body mass index is 34.96 kg/m. Has lost 30 lbs in recent past   F/u with Dr. Rayann Heman as scheduled 11/17 He is to call me if afib persists for more than 24 hours   Butch Penny C. Eiman Maret, [facility] [address] [place], [place] [address] [phone]

## 2015-11-28 ENCOUNTER — Other Ambulatory Visit: Payer: Self-pay | Admitting: Family Medicine

## 2015-11-28 DIAGNOSIS — K219 Gastro-esophageal reflux disease without esophagitis: Secondary | ICD-10-CM | POA: Diagnosis not present

## 2015-11-28 DIAGNOSIS — Z6834 Body mass index (BMI) 34.0-34.9, adult: Secondary | ICD-10-CM | POA: Diagnosis not present

## 2015-11-28 DIAGNOSIS — E669 Obesity, unspecified: Secondary | ICD-10-CM | POA: Diagnosis not present

## 2015-12-04 ENCOUNTER — Ambulatory Visit (HOSPITAL_COMMUNITY)
Admission: RE | Admit: 2015-12-04 | Discharge: 2015-12-04 | Disposition: A | Discharge status: Home / Self Care | Payer: BLUE CROSS/BLUE SHIELD | Source: Ambulatory Visit | Attending: Internal Medicine | Admitting: Internal Medicine

## 2015-12-04 ENCOUNTER — Encounter (HOSPITAL_COMMUNITY)
Admission: RE | Disposition: A | Discharge status: Home / Self Care | Payer: Self-pay | Source: Ambulatory Visit | Attending: Internal Medicine

## 2015-12-04 ENCOUNTER — Encounter (HOSPITAL_COMMUNITY): Payer: Self-pay | Admitting: *Deleted

## 2015-12-04 DIAGNOSIS — G4730 Sleep apnea, unspecified: Secondary | ICD-10-CM | POA: Diagnosis not present

## 2015-12-04 DIAGNOSIS — D123 Benign neoplasm of transverse colon: Secondary | ICD-10-CM | POA: Insufficient documentation

## 2015-12-04 DIAGNOSIS — Z1211 Encounter for screening for malignant neoplasm of colon: Secondary | ICD-10-CM | POA: Diagnosis not present

## 2015-12-04 DIAGNOSIS — D125 Benign neoplasm of sigmoid colon: Secondary | ICD-10-CM | POA: Diagnosis not present

## 2015-12-04 DIAGNOSIS — K644 Residual hemorrhoidal skin tags: Secondary | ICD-10-CM | POA: Diagnosis not present

## 2015-12-04 DIAGNOSIS — Z800 Family history of malignant neoplasm of digestive organs: Secondary | ICD-10-CM | POA: Insufficient documentation

## 2015-12-04 DIAGNOSIS — Z7901 Long term (current) use of anticoagulants: Secondary | ICD-10-CM | POA: Insufficient documentation

## 2015-12-04 DIAGNOSIS — I10 Essential (primary) hypertension: Secondary | ICD-10-CM | POA: Insufficient documentation

## 2015-12-04 DIAGNOSIS — Z86718 Personal history of other venous thrombosis and embolism: Secondary | ICD-10-CM | POA: Diagnosis not present

## 2015-12-04 DIAGNOSIS — K5730 Diverticulosis of large intestine without perforation or abscess without bleeding: Secondary | ICD-10-CM | POA: Diagnosis not present

## 2015-12-04 DIAGNOSIS — K219 Gastro-esophageal reflux disease without esophagitis: Secondary | ICD-10-CM | POA: Insufficient documentation

## 2015-12-04 DIAGNOSIS — Z87891 Personal history of nicotine dependence: Secondary | ICD-10-CM | POA: Insufficient documentation

## 2015-12-04 DIAGNOSIS — J45909 Unspecified asthma, uncomplicated: Secondary | ICD-10-CM | POA: Diagnosis not present

## 2015-12-04 DIAGNOSIS — I481 Persistent atrial fibrillation: Secondary | ICD-10-CM | POA: Insufficient documentation

## 2015-12-04 HISTORY — PX: COLONOSCOPY: SHX5424

## 2015-12-04 SURGERY — COLONOSCOPY
Anesthesia: Moderate Sedation

## 2015-12-04 MED ORDER — MIDAZOLAM HCL 5 MG/5ML IJ SOLN
INTRAMUSCULAR | Status: DC | PRN
  Administered 2015-12-04 (×2): 1 mg via INTRAVENOUS
  Administered 2015-12-04: 2 mg via INTRAVENOUS
  Administered 2015-12-04: 3 mg via INTRAVENOUS

## 2015-12-04 MED ORDER — STERILE WATER FOR IRRIGATION IR SOLN
Status: DC | PRN
  Administered 2015-12-04: 2.5 mL

## 2015-12-04 MED ORDER — MIDAZOLAM HCL 5 MG/5ML IJ SOLN
INTRAMUSCULAR | Status: AC
  Filled 2015-12-04: qty 10

## 2015-12-04 MED ORDER — MEPERIDINE HCL 50 MG/ML IJ SOLN
INTRAMUSCULAR | Status: DC | PRN
  Administered 2015-12-04 (×2): 25 mg via INTRAVENOUS

## 2015-12-04 MED ORDER — MEPERIDINE HCL 50 MG/ML IJ SOLN
INTRAMUSCULAR | Status: AC
  Filled 2015-12-04: qty 1

## 2015-12-04 NOTE — Discharge Instructions (Signed)
Keep Meloxicam use to minimum or discontinue this medication if at all possible. Resume Xarelto on 12/05/2015. Resume other medications and diet as before. No driving for 24 hours. Physician will call with biopsy results.  Colonoscopy, Care After Refer to this sheet in the next few weeks. These instructions provide you with information on caring for yourself after your procedure. Your health care provider may also give you more specific instructions. Your treatment has been planned according to current medical practices, but problems sometimes occur. Call your health care provider if you have any problems or questions after your procedure.  Dr Laural Golden [phone].  After hours, call the hospital and ask to have the GI doctor on call paged. WHAT TO EXPECT AFTER THE PROCEDURE  After your procedure, it is typical to have the following:  A small amount of blood in your stool.  Moderate amounts of gas and mild abdominal cramping or bloating. HOME CARE INSTRUCTIONS  Do not drive, operate machinery, or sign important documents for 24 hours.  You may shower and resume your regular physical activities, but move at a slower pace for the first 24 hours.  Take frequent rest periods for the first 24 hours.  Walk around or put a warm pack on your abdomen to help reduce abdominal cramping and bloating.  Drink enough fluids to keep your urine clear or pale yellow.  You may resume your normal diet as instructed by your health care provider. Avoid heavy or fried foods that are hard to digest.  Avoid drinking alcohol for 24 hours or as instructed by your health care provider.  Only take over-the-counter or prescription medicines as directed by your health care provider.  If a tissue sample (biopsy) was taken during your procedure:  Do not take aspirin or blood thinners for 7 days, or as instructed by your health care provider.  Do not drink alcohol for 7 days, or as instructed by your health care  provider.  Eat soft foods for the first 24 hours. SEEK MEDICAL CARE IF: You have persistent spotting of blood in your stool 2-3 days after the procedure. SEEK IMMEDIATE MEDICAL CARE IF:  You have more than a small spotting of blood in your stool.  You pass large blood clots in your stool.  Your abdomen is swollen (distended).  You have nausea or vomiting.  You have a fever.  You have increasing abdominal pain that is not relieved with medicine.   This information is not intended to replace advice given to you by your health care provider. Make sure you discuss any questions you have with your health care provider.   Document Released: 11/05/2003 Document Revised: 01/11/2013 Document Reviewed: 11/28/2012 Elsevier Interactive Patient Education 2016 Elsevier [employer]  Colon Polyps Polyps are lumps of extra tissue growing inside the body. Polyps can grow in the large intestine (colon). Most colon polyps are noncancerous (benign). However, some colon polyps can become cancerous over time. Polyps that are larger than a pea may be harmful. To be safe, caregivers remove and test all polyps. CAUSES  Polyps form when mutations in the genes cause your cells to grow and divide even though no more tissue is needed. RISK FACTORS There are a number of risk factors that can increase your chances of getting colon polyps. They include:  Being older than 50 years.  Family history of colon polyps or colon cancer.  Long-term colon diseases, such as colitis or Crohn disease.  Being overweight.  Smoking.  Being inactive.  Drinking  too much alcohol. SYMPTOMS  Most small polyps do not cause symptoms. If symptoms are present, they may include:  Blood in the stool. The stool may look dark red or black.  Constipation or diarrhea that lasts longer than 1 week. DIAGNOSIS People often do not know they have polyps until their caregiver finds them during a regular checkup. Your caregiver can use 4  tests to check for polyps:  Digital rectal exam. The caregiver wears gloves and feels inside the rectum. This test would find polyps only in the rectum.  Barium enema. The caregiver puts a liquid called barium into your rectum before taking X-rays of your colon. Barium makes your colon look white. Polyps are dark, so they are easy to see in the X-ray pictures.  Sigmoidoscopy. A thin, flexible tube (sigmoidoscope) is placed into your rectum. The sigmoidoscope has a light and tiny camera in it. The caregiver uses the sigmoidoscope to look at the last third of your colon.  Colonoscopy. This test is like sigmoidoscopy, but the caregiver looks at the entire colon. This is the most common method for finding and removing polyps. TREATMENT  Any polyps will be removed during a sigmoidoscopy or colonoscopy. The polyps are then tested for cancer. PREVENTION  To help lower your risk of getting more colon polyps:  Eat plenty of fruits and vegetables. Avoid eating fatty foods.  Do not smoke.  Avoid drinking alcohol.  Exercise every day.  Lose weight if recommended by your caregiver.  Eat plenty of calcium and folate. Foods that are rich in calcium include milk, cheese, and broccoli. Foods that are rich in folate include chickpeas, kidney beans, and spinach. HOME CARE INSTRUCTIONS Keep all follow-up appointments as directed by your caregiver. You may need periodic exams to check for polyps. SEEK MEDICAL CARE IF: You notice bleeding during a bowel movement.   This information is not intended to replace advice given to you by your health care provider. Make sure you discuss any questions you have with your health care provider.   Document Released: 12/18/2003 Document Revised: 04/13/2014 Document Reviewed: 06/02/2011 Elsevier Interactive Patient Education [employer].

## 2015-12-04 NOTE — Op Note (Signed)
[facility] Patient Name: Austin Woods Procedure Date: 12/04/2015 7:30 AM MRN: [id number] Date of Birth: [birth year] Attending MD: Hildred Laser , MD CSN: [id number] Age: 50 Admit Type: Outpatient Procedure:                Colonoscopy Indications:              Screening for colorectal malignant neoplasm Providers:                Hildred Laser, MD, Lurline Del, RN, Isabella Stalling,                            Technician Referring MD:             Grace Bushy. Wolfgang Phoenix, MD Medicines:                Meperidine 50 mg IV, Midazolam 7 mg IV Complications:            No immediate complications. Estimated Blood Loss:     Estimated blood loss was minimal. Procedure:                Pre-Anesthesia Assessment:                           - Prior to the procedure, a History and Physical                            was performed, and patient medications and                            allergies were reviewed. The patient's tolerance of                            previous anesthesia was also reviewed. The risks                            and benefits of the procedure and the sedation                            options and risks were discussed with the patient.                            All questions were answered, and informed consent                            was obtained. Prior Anticoagulants: The patient                            last took Xarelto (rivaroxaban) 2 days prior to the                            procedure. ASA Grade Assessment: III - A patient                            with severe systemic disease. After reviewing the  risks and benefits, the patient was deemed in                            satisfactory condition to undergo the procedure.                           After obtaining informed consent, the colonoscope                            was passed under direct vision. Throughout the                            procedure, the patient's blood pressure, pulse,  and                            oxygen saturations were monitored continuously. The                            EC-[id number]) scope was introduced through                            the anus and advanced to the the cecum, identified                            by appendiceal orifice and ileocecal valve. The                            colonoscopy was performed without difficulty. The                            patient tolerated the procedure well. The quality                            of the bowel preparation was good. The ileocecal                            valve, appendiceal orifice, and rectum were                            photographed. Scope In: 7:48:06 AM Scope Out: 8:05:39 AM Scope Withdrawal Time: 0 hours 10 minutes 14 seconds  Total Procedure Duration: 0 hours 17 minutes 33 seconds  Findings:      Two sessile polyps were found in the proximal sigmoid colon and hepatic       flexure. The polyps were 4 to 5 mm in size. These polyps were removed       with a cold snare. Resection and retrieval were complete. The pathology       specimen was placed into Bottle Number 1.      A single small-mouthed diverticulum was found in the hepatic flexure.      External hemorrhoids were found during retroflexion. The hemorrhoids       were small. Impression:               - Two 4 to 5 mm polyps in the proximal sigmoid  colon and at the hepatic flexure, removed with a                            cold snare. Resected and retrieved.                           - Diverticulosis at the hepatic flexure.                           - External hemorrhoids. Moderate Sedation:      Moderate (conscious) sedation was administered by the endoscopy nurse       and supervised by the endoscopist. The following parameters were       monitored: oxygen saturation, heart rate, blood pressure, CO2       capnography and response to care. Total physician intraservice time was       27  minutes. Recommendation:           - Patient has a contact number available for                            emergencies. The signs and symptoms of potential                            delayed complications were discussed with the                            patient. Return to normal activities tomorrow.                            Written discharge instructions were provided to the                            patient.                           - Resume previous diet today.                           - Continue present medications.                           - Resume Xarelto (rivaroxaban) at prior dose                            tomorrow.                           - Await pathology results.                           - Repeat colonoscopy for surveillance based on                            pathology results. Procedure Code(s):        --- Professional ---  [phone], Colonoscopy, flexible; with removal of                            tumor(s), polyp(s), or other lesion(s) by snare                            technique                           99152, Moderate sedation services provided by the                            same physician or other qualified health care                            professional performing the diagnostic or                            therapeutic service that the sedation supports,                            requiring the presence of an independent trained                            observer to assist in the monitoring of the                            patient's level of consciousness and physiological                            status; initial 15 minutes of intraservice time,                            patient age 26 years or older                           [id number], Moderate sedation services; each additional                            15 minutes intraservice time Diagnosis Code(s):        --- Professional ---                           Z12.11, Encounter  for screening for malignant                            neoplasm of colon                           D12.5, Benign neoplasm of sigmoid colon                           D12.3, Benign neoplasm of transverse colon (hepatic                            flexure  or splenic flexure)                           K64.4, Residual hemorrhoidal skin tags                           K57.30, Diverticulosis of large intestine without                            perforation or abscess without bleeding CPT copyright 2016 American Medical Association. All rights reserved. The codes documented in this report are preliminary and upon coder review may  be revised to meet current compliance requirements. Hildred Laser, MD Hildred Laser, MD 12/04/2015 8:14:23 AM This report has been signed electronically. Number of Addenda: 0

## 2015-12-04 NOTE — H&P (Signed)
Austin Woods is an 50 y.o. male.   Chief Complaint: Patient is here for colonoscopy. HPI: Patient is 50 year old Caucasian male who is here for screening colonoscopy. He denies abdominal pain change in bowel habits or rectal bleeding. Family history significant for CRC in in maternal uncle who was in his 50s of the time of diagnosis and died of unrelated causes. Patient has been off Xarelto for 2 days.  Past Medical History:  Diagnosis Date  . Asthma   . GERD (gastroesophageal reflux disease)   . High cholesterol   . History of DVT (deep vein thrombosis)    a. left leg following ablation for SVT  . Hypertension   . Persistent atrial fibrillation ([place])    a. Dx 08/2012, xarelto initiated; b. 09/2012 Tikosyn initiated -> DCCV -> Sinus c. PVI 03/2013 d. PVI 03/2014  . Sleep apnea    does not use CPAP (he reports having uvulectomy)   . Wolff-Parkinson-White (WPW) syndrome    a. s/p RFCA 2001 by Dr Caryl Comes, pt reports complicated by post procedure DVT    Past Surgical History:  Procedure Laterality Date  . ATRIAL FIBRILLATION ABLATION  03/23/2013   PVI by DR Rayann Heman for afib  . ATRIAL FIBRILLATION ABLATION N/A 03/23/2013   Procedure: ATRIAL FIBRILLATION ABLATION;  Surgeon: Coralyn Mark, MD;  Location: [place] CATH LAB;  Service: Cardiovascular;  Laterality: N/A;  . ATRIAL FIBRILLATION ABLATION N/A 03/27/2014   PVI Dr Rayann Heman  . BACK SURGERY    . CARDIAC SURGERY    . CARDIOVERSION N/A 08/25/2012   Procedure: CARDIOVERSION;  Surgeon: Thayer Headings, MD;  Location: [facility] ENDOSCOPY;  Service: Cardiovascular;  Laterality: N/A;  . CARDIOVERSION N/A 09/06/2012   Procedure: CARDIOVERSION/Bedside;  Surgeon: Carlena Bjornstad, MD;  Location: [place];  Service: Cardiovascular;  Laterality: N/A;  . CARDIOVERSION N/A 01/12/2014   Procedure: CARDIOVERSION;  Surgeon: Pixie Casino, MD;  Location: [facility] ENDOSCOPY;  Service: Cardiovascular;  Laterality: N/A;  . LOOP RECORDER IMPLANT N/A 06/29/2014   Procedure: LOOP  RECORDER IMPLANT;  Surgeon: Thompson Grayer, MD;  Location: [facility] CATH LAB;  Service: Cardiovascular;  Laterality: N/A;  . NOSE SURGERY     sleep apnea surgery  . STOMACH SURGERY     reflux, fundoplication  . TEE WITHOUT CARDIOVERSION N/A 08/25/2012   Procedure: TRANSESOPHAGEAL ECHOCARDIOGRAM (TEE);  Surgeon: Thayer Headings, MD;  Location: [place];  Service: Cardiovascular;  Laterality: N/A;  . TEE WITHOUT CARDIOVERSION  03/23/2013   DR ROSS  . TEE WITHOUT CARDIOVERSION N/A 03/23/2013   Procedure: TRANSESOPHAGEAL ECHOCARDIOGRAM (TEE);  Surgeon: Fay Records, MD;  Location: [facility] ENDOSCOPY;  Service: Cardiovascular;  Laterality: N/A;  . TEE WITHOUT CARDIOVERSION N/A 03/26/2014   Procedure: TRANSESOPHAGEAL ECHOCARDIOGRAM (TEE);  Surgeon: Josue Hector, MD;  Location: [facility] ENDOSCOPY;  Service: Cardiovascular;  Laterality: N/A;  . TONSILLECTOMY      Family History  Problem Relation Age of Onset  . CAD Father 12  . Diabetes Father   . Heart attack Father   . Transient ischemic attack Father   . Vascular Disease Father   . Diabetes Mother   . Anemia Mother   . Gout Mother   . Aneurysm Maternal Grandmother   . Stroke Maternal Grandfather   . Diabetes Paternal Grandmother   . Stroke Paternal Grandfather   . Diabetes Brother    Social History:  reports that he quit smoking about 5 years ago. His smoking use included Cigarettes. He started smoking about 12 years  ago. He has a 7.00 pack-year smoking history. He has never used smokeless tobacco. He reports that he does not drink alcohol or use drugs.  Allergies:  Allergies  Allergen Reactions  . Pollen Extract     Runny nose Watery eyes Nasal congestion    Medications Prior to Admission  Medication Sig Dispense Refill  . albuterol (PROVENTIL HFA) 108 (90 BASE) MCG/ACT inhaler Inhale 2 puffs into the lungs every 4 (four) hours as needed for wheezing or shortness of breath.    Marland Kitchen atorvastatin (LIPITOR) 10 MG tablet Take 10 mg by mouth  daily.    . Cholecalciferol (VITAMIN D) 2000 UNITS CAPS Take 2 capsules by mouth daily.     . enalapril (VASOTEC) 10 MG tablet Take 1 tablet by mouth  daily 90 tablet 0  . Fluticasone-Salmeterol (ADVAIR DISKUS) 250-50 MCG/DOSE AEPB Inhale 1 puff into the lungs every 12 (twelve) hours.    . furosemide (LASIX) 40 MG tablet Take 1 tablet by mouth  daily 90 tablet 0  . ketoconazole (NIZORAL) 2 % cream Apply 1 application topically 2 (two) times daily. 30 g 4  . meloxicam (MOBIC) 15 MG tablet Take 15 mg by mouth daily as needed for pain.     . metoprolol succinate (TOPROL-XL) 25 MG 24 hr tablet Take 1 tablet (25 mg total) by mouth daily. 180 tablet 1  . omeprazole (PRILOSEC) 20 MG capsule Take 20 mg by mouth daily.    Marland Kitchen oxybutynin (DITROPAN) 5 MG tablet Take 2.5 mg by mouth 2 (two) times daily as needed for bladder spasms.     . polyethylene glycol-electrolytes (TRILYTE) 420 g solution Take 4,000 mLs by mouth once. 4000 mL 0  . Potassium Chloride ER 20 MEQ TBCR Take 1 tablet by mouth daily. 90 tablet 1  . PROCTOZONE-HC 2.5 % rectal cream Apply 3 times daily 90 g 3  . rivaroxaban (XARELTO) 20 MG TABS tablet Take 1 tablet (20 mg total) by mouth daily with supper. 90 tablet 2  . HYDROcodone-acetaminophen (NORCO/VICODIN) 5-325 MG tablet Take 1 tablet by mouth every 6 (six) hours as needed for moderate pain. 10 tablet 0  . metoprolol succinate (TOPROL-XL) 25 MG 24 hr tablet Take 1 tablet by mouth two  times daily 180 tablet 0  . tadalafil (CIALIS) 5 MG tablet Take 5 mg by mouth daily as needed for erectile dysfunction.    . triamcinolone cream (KENALOG) 0.1 % Apply 1 application topically 2 (two) times daily. Prn rash; use up to 2 weeks 30 g 0    No results found for this or any previous visit (from the past 48 hour(s)). No results found.  ROS  Blood pressure 116/85, pulse 99, temperature 97.7 F (36.5 C), temperature source Oral, SpO2 100 %. Physical Exam  Constitutional: He is oriented to person,  place, and time. He appears well-developed and well-nourished.  HENT:  Mouth/Throat: Oropharynx is clear and moist.  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 No scleral icterus.  Neck: No thyromegaly present.  Cardiovascular:  Irregular rhythm normal S1 and S2. No murmur or gallop.  Respiratory: Effort normal and breath sounds normal.  GI: Soft. He exhibits no distension and no mass. There is no tenderness.  Musculoskeletal: He exhibits no edema.  Lymphadenopathy:    He has no cervical adenopathy.  Neurological: He is alert and oriented to person, place, and time.  Skin: Skin is warm and dry.  Assessment/Plan Average risk screening colonoscopy.  Hildred Laser, MD 12/04/2015, 7:33 AM

## 2015-12-10 ENCOUNTER — Encounter (HOSPITAL_COMMUNITY): Payer: Self-pay | Admitting: Internal Medicine

## 2015-12-17 LAB — CUP PACEART REMOTE DEVICE CHECK: MDC IDC SESS DTM: 20170817163723

## 2015-12-23 ENCOUNTER — Ambulatory Visit (INDEPENDENT_AMBULATORY_CARE_PROVIDER_SITE_OTHER): Payer: BLUE CROSS/BLUE SHIELD | Admitting: *Deleted

## 2015-12-23 DIAGNOSIS — I480 Paroxysmal atrial fibrillation: Secondary | ICD-10-CM | POA: Diagnosis not present

## 2015-12-23 NOTE — Progress Notes (Signed)
Carelink Summary Report / Loop Recorder 

## 2016-01-02 DIAGNOSIS — E669 Obesity, unspecified: Secondary | ICD-10-CM | POA: Diagnosis not present

## 2016-01-02 DIAGNOSIS — Z6834 Body mass index (BMI) 34.0-34.9, adult: Secondary | ICD-10-CM | POA: Diagnosis not present

## 2016-01-07 ENCOUNTER — Other Ambulatory Visit: Payer: Self-pay | Admitting: Family Medicine

## 2016-01-14 DIAGNOSIS — E1165 Type 2 diabetes mellitus with hyperglycemia: Secondary | ICD-10-CM | POA: Diagnosis not present

## 2016-01-14 DIAGNOSIS — E039 Hypothyroidism, unspecified: Secondary | ICD-10-CM | POA: Diagnosis not present

## 2016-01-14 DIAGNOSIS — Z794 Long term (current) use of insulin: Secondary | ICD-10-CM | POA: Diagnosis not present

## 2016-01-15 DIAGNOSIS — Z23 Encounter for immunization: Secondary | ICD-10-CM | POA: Diagnosis not present

## 2016-01-16 LAB — CUP PACEART REMOTE DEVICE CHECK: MDC IDC SESS DTM: 20170916170750

## 2016-01-16 NOTE — Progress Notes (Signed)
[facility] summary report received. Battery status OK. Normal device function. No new tachy episodes, brady, or pause episodes. 1 symptom- ECG appears AF w. RVR, PVCs. 17 AF No ECGs AF burden 72.4% +Toprol 25 mg BID +Xarelto. Monthly summary reports and ROV/PRN

## 2016-01-20 ENCOUNTER — Ambulatory Visit (INDEPENDENT_AMBULATORY_CARE_PROVIDER_SITE_OTHER): Payer: BLUE CROSS/BLUE SHIELD | Admitting: *Deleted

## 2016-01-20 DIAGNOSIS — I480 Paroxysmal atrial fibrillation: Secondary | ICD-10-CM | POA: Diagnosis not present

## 2016-01-21 NOTE — Progress Notes (Signed)
Carelink Summary Report / Loop Recorder 

## 2016-01-23 DIAGNOSIS — Z6834 Body mass index (BMI) 34.0-34.9, adult: Secondary | ICD-10-CM | POA: Diagnosis not present

## 2016-01-23 DIAGNOSIS — E669 Obesity, unspecified: Secondary | ICD-10-CM | POA: Diagnosis not present

## 2016-01-29 ENCOUNTER — Ambulatory Visit (HOSPITAL_BASED_OUTPATIENT_CLINIC_OR_DEPARTMENT_OTHER)
Admission: RE | Admit: 2016-01-29 | Discharge: 2016-01-29 | Disposition: A | Discharge status: Home / Self Care | Payer: BLUE CROSS/BLUE SHIELD | Source: Ambulatory Visit | Attending: Nurse Practitioner | Admitting: Nurse Practitioner

## 2016-01-29 ENCOUNTER — Encounter (HOSPITAL_COMMUNITY): Payer: Self-pay | Admitting: Nurse Practitioner

## 2016-01-29 VITALS — BP 154/92 | HR 115 | Ht 73.0 in | Wt 266.4 lb

## 2016-01-29 DIAGNOSIS — Z6835 Body mass index (BMI) 35.0-35.9, adult: Secondary | ICD-10-CM | POA: Insufficient documentation

## 2016-01-29 DIAGNOSIS — I4819 Other persistent atrial fibrillation: Secondary | ICD-10-CM

## 2016-01-29 DIAGNOSIS — N289 Disorder of kidney and ureter, unspecified: Secondary | ICD-10-CM | POA: Diagnosis not present

## 2016-01-29 DIAGNOSIS — Z833 Family history of diabetes mellitus: Secondary | ICD-10-CM | POA: Diagnosis not present

## 2016-01-29 DIAGNOSIS — Z79891 Long term (current) use of opiate analgesic: Secondary | ICD-10-CM | POA: Diagnosis not present

## 2016-01-29 DIAGNOSIS — Z91048 Other nonmedicinal substance allergy status: Secondary | ICD-10-CM | POA: Diagnosis not present

## 2016-01-29 DIAGNOSIS — Z9889 Other specified postprocedural states: Secondary | ICD-10-CM

## 2016-01-29 DIAGNOSIS — Z87891 Personal history of nicotine dependence: Secondary | ICD-10-CM

## 2016-01-29 DIAGNOSIS — K219 Gastro-esophageal reflux disease without esophagitis: Secondary | ICD-10-CM | POA: Diagnosis not present

## 2016-01-29 DIAGNOSIS — Z8679 Personal history of other diseases of the circulatory system: Secondary | ICD-10-CM | POA: Diagnosis not present

## 2016-01-29 DIAGNOSIS — G4733 Obstructive sleep apnea (adult) (pediatric): Secondary | ICD-10-CM

## 2016-01-29 DIAGNOSIS — I480 Paroxysmal atrial fibrillation: Secondary | ICD-10-CM | POA: Diagnosis not present

## 2016-01-29 DIAGNOSIS — I481 Persistent atrial fibrillation: Secondary | ICD-10-CM

## 2016-01-29 DIAGNOSIS — R072 Precordial pain: Secondary | ICD-10-CM | POA: Diagnosis not present

## 2016-01-29 DIAGNOSIS — Z95818 Presence of other cardiac implants and grafts: Secondary | ICD-10-CM | POA: Diagnosis not present

## 2016-01-29 DIAGNOSIS — E7800 Pure hypercholesterolemia, unspecified: Secondary | ICD-10-CM | POA: Diagnosis not present

## 2016-01-29 DIAGNOSIS — Z79899 Other long term (current) drug therapy: Secondary | ICD-10-CM

## 2016-01-29 DIAGNOSIS — I25110 Atherosclerotic heart disease of native coronary artery with unstable angina pectoris: Secondary | ICD-10-CM | POA: Diagnosis not present

## 2016-01-29 DIAGNOSIS — Z8249 Family history of ischemic heart disease and other diseases of the circulatory system: Secondary | ICD-10-CM | POA: Diagnosis not present

## 2016-01-29 DIAGNOSIS — R079 Chest pain, unspecified: Secondary | ICD-10-CM | POA: Diagnosis not present

## 2016-01-29 DIAGNOSIS — Z823 Family history of stroke: Secondary | ICD-10-CM | POA: Diagnosis not present

## 2016-01-29 DIAGNOSIS — R0789 Other chest pain: Secondary | ICD-10-CM | POA: Diagnosis present

## 2016-01-29 DIAGNOSIS — I10 Essential (primary) hypertension: Secondary | ICD-10-CM | POA: Diagnosis not present

## 2016-01-29 DIAGNOSIS — E669 Obesity, unspecified: Secondary | ICD-10-CM

## 2016-01-29 DIAGNOSIS — I200 Unstable angina: Secondary | ICD-10-CM | POA: Diagnosis not present

## 2016-01-29 DIAGNOSIS — J45909 Unspecified asthma, uncomplicated: Secondary | ICD-10-CM | POA: Diagnosis not present

## 2016-01-29 DIAGNOSIS — Z86718 Personal history of other venous thrombosis and embolism: Secondary | ICD-10-CM | POA: Diagnosis not present

## 2016-01-29 DIAGNOSIS — I4891 Unspecified atrial fibrillation: Secondary | ICD-10-CM | POA: Diagnosis not present

## 2016-01-29 DIAGNOSIS — Z7901 Long term (current) use of anticoagulants: Secondary | ICD-10-CM | POA: Diagnosis not present

## 2016-01-29 DIAGNOSIS — Z7952 Long term (current) use of systemic steroids: Secondary | ICD-10-CM | POA: Diagnosis not present

## 2016-01-29 DIAGNOSIS — Z791 Long term (current) use of non-steroidal anti-inflammatories (NSAID): Secondary | ICD-10-CM | POA: Diagnosis not present

## 2016-01-29 LAB — BASIC METABOLIC PANEL
ANION GAP: 10 (ref 5–15)
BUN: 17 mg/dL (ref 6–20)
CHLORIDE: 103 mmol/L (ref 101–111)
CO2: 25 mmol/L (ref 22–32)
Calcium: 9.5 mg/dL (ref 8.9–10.3)
Creatinine, Ser: 1.21 mg/dL (ref 0.61–1.24)
GFR calc non Af Amer: >60 mL/min (ref >60)
Glucose, Bld: 126 mg/dL — ABNORMAL HIGH (ref 65–99)
Potassium: 4.1 mmol/L (ref 3.5–5.1)
Sodium: 138 mmol/L (ref 135–145)

## 2016-01-29 LAB — CBC
HEMATOCRIT: 45.9 % (ref 39.0–52.0)
HEMOGLOBIN: 16 g/dL (ref 13.0–17.0)
MCH: 31.9 pg (ref 26.0–34.0)
MCHC: 34.9 g/dL (ref 30.0–36.0)
MCV: 91.6 fL (ref 78.0–100.0)
Platelets: 253 10*3/uL (ref 150–400)
RBC: 5.01 MIL/uL (ref 4.22–5.81)
RDW: 12.8 % (ref 11.5–15.5)
WBC: 8 10*3/uL (ref 4.0–10.5)

## 2016-01-29 MED ORDER — METOPROLOL SUCCINATE ER 25 MG PO TB24: 25.0000 mg | ORAL_TABLET | Freq: Two times a day (BID) | ORAL | 2 refills | Status: DC

## 2016-01-29 NOTE — Progress Notes (Signed)
PCP:  Mickie Hillier, MD Primary Cardiologist:  Bronson Ing   50 yo male with h/o afib in afib clinic for evaluation.Marland Kitchen He was contacted by phone re link showing afib burden at 74%.S/p ablation x 2 2014/2015. When he was seen in August, he was in afib, but had not taken BB that day . He reports that he had paroxysmal afib at that point but for last 3-4 weeks has become persistent. He has chest discomfort, lightheadedness, fatigue with afib. BB was decreased in May by Dr. Rayann Heman but will be increased back to bid today. Taking xarelto without bleeding issues.Has failed tikosyn in the past.  Using CPAP recently and does admit makes him feel better.  Positive today for fatigue, lightheadedness, chest discomfort  Past Medical History:  Diagnosis Date  . Asthma   . GERD (gastroesophageal reflux disease)   . High cholesterol   . History of DVT (deep vein thrombosis)    a. left leg following ablation for SVT  . Hypertension   . Persistent atrial fibrillation ([place])    a. Dx 08/2012, xarelto initiated; b. 09/2012 Tikosyn initiated -> DCCV -> Sinus c. PVI 03/2013 d. PVI 03/2014  . Sleep apnea    does not use CPAP (he reports having uvulectomy)   . Wolff-Parkinson-White (WPW) syndrome    a. s/p RFCA 2001 by Dr Caryl Comes, pt reports complicated by post procedure DVT   Past Surgical History:  Procedure Laterality Date  . ATRIAL FIBRILLATION ABLATION  03/23/2013   PVI by DR Rayann Heman for afib  . ATRIAL FIBRILLATION ABLATION N/A 03/23/2013   Procedure: ATRIAL FIBRILLATION ABLATION;  Surgeon: Coralyn Mark, MD;  Location: [place] CATH LAB;  Service: Cardiovascular;  Laterality: N/A;  . ATRIAL FIBRILLATION ABLATION N/A 03/27/2014   PVI Dr Rayann Heman  . BACK SURGERY    . CARDIAC SURGERY    . CARDIOVERSION N/A 08/25/2012   Procedure: CARDIOVERSION;  Surgeon: Thayer Headings, MD;  Location: [facility] ENDOSCOPY;  Service: Cardiovascular;  Laterality: N/A;  . CARDIOVERSION N/A 09/06/2012   Procedure: CARDIOVERSION/Bedside;   Surgeon: Carlena Bjornstad, MD;  Location: [place];  Service: Cardiovascular;  Laterality: N/A;  . CARDIOVERSION N/A 01/12/2014   Procedure: CARDIOVERSION;  Surgeon: Pixie Casino, MD;  Location: [facility] ENDOSCOPY;  Service: Cardiovascular;  Laterality: N/A;  . COLONOSCOPY N/A 12/04/2015   Procedure: COLONOSCOPY;  Surgeon: Rogene Houston, MD;  [facility]O [facility];  Service: Endoscopy;  Laterality: N/A;  730  . LOOP RECORDER IMPLANT N/A 06/29/2014   Procedure: LOOP RECORDER IMPLANT;  Surgeon: Thompson Grayer, MD;  Location: [facility] CATH LAB;  Service: Cardiovascular;  Laterality: N/A;  . NOSE SURGERY     sleep apnea surgery  . STOMACH SURGERY     reflux, fundoplication  . TEE WITHOUT CARDIOVERSION N/A 08/25/2012   Procedure: TRANSESOPHAGEAL ECHOCARDIOGRAM (TEE);  Surgeon: Thayer Headings, MD;  Location: [place];  Service: Cardiovascular;  Laterality: N/A;  . TEE WITHOUT CARDIOVERSION  03/23/2013   DR ROSS  . TEE WITHOUT CARDIOVERSION N/A 03/23/2013   Procedure: TRANSESOPHAGEAL ECHOCARDIOGRAM (TEE);  Surgeon: Fay Records, MD;  Location: [facility] ENDOSCOPY;  Service: Cardiovascular;  Laterality: N/A;  . TEE WITHOUT CARDIOVERSION N/A 03/26/2014   Procedure: TRANSESOPHAGEAL ECHOCARDIOGRAM (TEE);  Surgeon: Josue Hector, MD;  Location: [facility] ENDOSCOPY;  Service: Cardiovascular;  Laterality: N/A;  . TONSILLECTOMY      Current Outpatient Prescriptions  Medication Sig Dispense Refill  . albuterol (PROVENTIL HFA) 108 (90 BASE) MCG/ACT inhaler Inhale 2 puffs  into the lungs every 4 (four) hours as needed for wheezing or shortness of breath.    Marland Kitchen atorvastatin (LIPITOR) 10 MG tablet Take 10 mg by mouth daily.    . Cholecalciferol (VITAMIN D) 2000 UNITS CAPS Take 2 capsules by mouth daily.     . enalapril (VASOTEC) 10 MG tablet Take 1 tablet by mouth  daily 90 tablet 0  . Fluticasone-Salmeterol (ADVAIR DISKUS) 250-50 MCG/DOSE AEPB Inhale 1 puff into the lungs every 12 (twelve) hours.    . furosemide (LASIX) 40 MG  tablet TAKE 1 TABLET BY MOUTH  DAILY 90 tablet 0  . HYDROcodone-acetaminophen (NORCO/VICODIN) 5-325 MG tablet Take 1 tablet by mouth every 6 (six) hours as needed for moderate pain. 10 tablet 0  . ketoconazole (NIZORAL) 2 % cream Apply 1 application topically 2 (two) times daily. 30 g 4  . meloxicam (MOBIC) 15 MG tablet Take 15 mg by mouth daily as needed for pain.     . metoprolol succinate (TOPROL-XL) 25 MG 24 hr tablet Take 1 tablet (25 mg total) by mouth 2 (two) times daily. 180 tablet 2  . omeprazole (PRILOSEC) 20 MG capsule Take 20 mg by mouth daily.    Marland Kitchen oxybutynin (DITROPAN) 5 MG tablet Take 2.5 mg by mouth 2 (two) times daily as needed for bladder spasms.     . Potassium Chloride ER 20 MEQ TBCR Take 1 tablet by mouth daily. 90 tablet 1  . PROCTOZONE-HC 2.5 % rectal cream Apply 3 times daily 90 g 3  . rivaroxaban (XARELTO) 20 MG TABS tablet Take 1 tablet (20 mg total) by mouth daily with supper. 90 tablet 2  . tadalafil (CIALIS) 5 MG tablet Take 5 mg by mouth daily as needed for erectile dysfunction.    . triamcinolone cream (KENALOG) 0.1 % Apply 1 application topically 2 (two) times daily. Prn rash; use up to 2 weeks 30 g 0   No current facility-administered medications for this encounter.    ROS- he has headaches chronically,dizziness, gerd, continues to gain weight, sinus pressure, itchy eyes, all systems are reviewed and negative except as per HPI above .  Allergies  Allergen Reactions  . Pollen Extract     Runny nose Watery eyes Nasal congestion    Social History   Social History  . Marital status: Married    Spouse name: N/A  . Number of children: 2  . Years of education: N/A   Occupational History  .  [employer]   Social History Main Topics  . Smoking status: Former Smoker    Packs/day: 1.00    Years: 7.00    Types: Cigarettes    Start date: 05/19/2003    Quit date: 08/06/2010  . Smokeless tobacco: Never Used  . Alcohol use No  . Drug use: No  . Sexual  activity: Not on file   Other Topics Concern  . Not on file   Social History Narrative   Pt lives in [place] with wife.  Works at [employer] History  Problem Relation Age of Onset  . CAD Father 46  . Diabetes Father   . Heart attack Father   . Transient ischemic attack Father   . Vascular Disease Father   . Diabetes Mother   . Anemia Mother   . Gout Mother   . Aneurysm Maternal Grandmother   . Stroke Maternal Grandfather   . Diabetes Paternal Grandmother   . Stroke Paternal Grandfather   . Diabetes Brother  Physical Exam: Vitals:   01/29/16 1129  BP: (!) 154/92  BP Location: Left Arm  Patient Position: Sitting  Cuff Size: Normal  Pulse: (!) 115  Weight: 266 lb 6.4 oz (120.8 kg)  Height: 6\' 1"  (1.854 m)    GEN- The patient is well appearing, alert and oriented x 3 today.   Head- normocephalic, atraumatic Eyes-  Sclera clear, conjunctiva pink Ears- hearing intact Oropharynx- clear Neck- supple, no JVP Lymph- no cervical lymphadenopathy Lungs- Clear to ausculation bilaterally, normal work of breathing Heart-Irregular rate and rhythm, no murmurs, rubs or gallops, PMI not laterally displaced GI- soft, NT, ND, + BS Extremities- no clubbing, cyanosis, trace edema MS- no significant deformity or atrophy Skin- no rash or lesion Psych- euthymic mood, full affect Neuro- strength and sensation are intact  ekg today reveals afib at 115 bpm. qrs int 94 ms, qtc 431 ms Epic records reviewed   Assessment and Plan:  1. Persistent symptomatic Afib s/p 2 previous ablations and failed tikosyn, amiodarone, flecainide Will set up for DCCV States no missed doses of xarelto for at least three weeks Increase metoprolol succinate 25 mg   to bid  2. OSA Compliance with CPAP encouraged.  4. Obesity Body mass index is 35.15 kg/m. Stable,has lost 30 lbs in  past   F/u with Dr. Rayann Heman as scheduled 11/1    Geroge Baseman. Keyston Ardolino, [facility] [address] [place], [place] [address] [phone]

## 2016-01-29 NOTE — Patient Instructions (Signed)
Cardioversion scheduled for Thursday, October 26th  - Arrive at the [facility] and go to admitting at 1:30PM  -Do not eat or drink anything after midnight the night prior to your procedure.  - Take all your medication with a sip of water prior to arrival.  - You will not be able to drive home after your procedure.  Your physician has recommended you make the following change in your medication:  1)Increase metoprolol to 25mg  twice a day

## 2016-01-30 ENCOUNTER — Encounter (HOSPITAL_COMMUNITY)
Admission: AD | Disposition: A | Discharge status: Home / Self Care | Payer: Self-pay | Source: Ambulatory Visit | Attending: Cardiology

## 2016-01-30 ENCOUNTER — Inpatient Hospital Stay (HOSPITAL_COMMUNITY)
Admission: AD | Admit: 2016-01-30 | Discharge: 2016-02-04 | DRG: 247 | Disposition: A | Discharge status: Home / Self Care | Payer: BLUE CROSS/BLUE SHIELD | Source: Ambulatory Visit | Attending: Cardiology | Admitting: Cardiology

## 2016-01-30 ENCOUNTER — Ambulatory Visit (HOSPITAL_COMMUNITY): Payer: BLUE CROSS/BLUE SHIELD | Admitting: Anesthesiology

## 2016-01-30 ENCOUNTER — Encounter (HOSPITAL_COMMUNITY): Payer: Self-pay

## 2016-01-30 DIAGNOSIS — Z87891 Personal history of nicotine dependence: Secondary | ICD-10-CM

## 2016-01-30 DIAGNOSIS — G4733 Obstructive sleep apnea (adult) (pediatric): Secondary | ICD-10-CM | POA: Diagnosis present

## 2016-01-30 DIAGNOSIS — K219 Gastro-esophageal reflux disease without esophagitis: Secondary | ICD-10-CM | POA: Diagnosis present

## 2016-01-30 DIAGNOSIS — Z791 Long term (current) use of non-steroidal anti-inflammatories (NSAID): Secondary | ICD-10-CM

## 2016-01-30 DIAGNOSIS — I4891 Unspecified atrial fibrillation: Secondary | ICD-10-CM | POA: Diagnosis not present

## 2016-01-30 DIAGNOSIS — I10 Essential (primary) hypertension: Secondary | ICD-10-CM | POA: Diagnosis present

## 2016-01-30 DIAGNOSIS — Z95818 Presence of other cardiac implants and grafts: Secondary | ICD-10-CM

## 2016-01-30 DIAGNOSIS — Z9861 Coronary angioplasty status: Secondary | ICD-10-CM

## 2016-01-30 DIAGNOSIS — I25110 Atherosclerotic heart disease of native coronary artery with unstable angina pectoris: Secondary | ICD-10-CM | POA: Diagnosis present

## 2016-01-30 DIAGNOSIS — E7800 Pure hypercholesterolemia, unspecified: Secondary | ICD-10-CM | POA: Diagnosis present

## 2016-01-30 DIAGNOSIS — Z833 Family history of diabetes mellitus: Secondary | ICD-10-CM

## 2016-01-30 DIAGNOSIS — Z823 Family history of stroke: Secondary | ICD-10-CM

## 2016-01-30 DIAGNOSIS — Z79891 Long term (current) use of opiate analgesic: Secondary | ICD-10-CM

## 2016-01-30 DIAGNOSIS — I481 Persistent atrial fibrillation: Principal | ICD-10-CM | POA: Diagnosis present

## 2016-01-30 DIAGNOSIS — Z6835 Body mass index (BMI) 35.0-35.9, adult: Secondary | ICD-10-CM

## 2016-01-30 DIAGNOSIS — R079 Chest pain, unspecified: Secondary | ICD-10-CM | POA: Diagnosis present

## 2016-01-30 DIAGNOSIS — J45909 Unspecified asthma, uncomplicated: Secondary | ICD-10-CM | POA: Diagnosis present

## 2016-01-30 DIAGNOSIS — Z955 Presence of coronary angioplasty implant and graft: Secondary | ICD-10-CM

## 2016-01-30 DIAGNOSIS — E669 Obesity, unspecified: Secondary | ICD-10-CM | POA: Diagnosis present

## 2016-01-30 DIAGNOSIS — I200 Unstable angina: Secondary | ICD-10-CM

## 2016-01-30 DIAGNOSIS — Z8249 Family history of ischemic heart disease and other diseases of the circulatory system: Secondary | ICD-10-CM

## 2016-01-30 DIAGNOSIS — Z8679 Personal history of other diseases of the circulatory system: Secondary | ICD-10-CM

## 2016-01-30 DIAGNOSIS — Z86718 Personal history of other venous thrombosis and embolism: Secondary | ICD-10-CM

## 2016-01-30 DIAGNOSIS — Z91048 Other nonmedicinal substance allergy status: Secondary | ICD-10-CM

## 2016-01-30 DIAGNOSIS — Z7952 Long term (current) use of systemic steroids: Secondary | ICD-10-CM

## 2016-01-30 DIAGNOSIS — Z7901 Long term (current) use of anticoagulants: Secondary | ICD-10-CM

## 2016-01-30 DIAGNOSIS — I4819 Other persistent atrial fibrillation: Secondary | ICD-10-CM

## 2016-01-30 DIAGNOSIS — N289 Disorder of kidney and ureter, unspecified: Secondary | ICD-10-CM | POA: Diagnosis not present

## 2016-01-30 HISTORY — DX: Headache, unspecified: R51.9

## 2016-01-30 HISTORY — DX: Headache: R51

## 2016-01-30 HISTORY — PX: CARDIOVERSION: SHX1299

## 2016-01-30 LAB — CK: Total CK: 69 U/L (ref 49–397)

## 2016-01-30 LAB — TROPONIN I

## 2016-01-30 SURGERY — CARDIOVERSION
Anesthesia: General

## 2016-01-30 MED ORDER — ONDANSETRON HCL 4 MG/2ML IJ SOLN: 4.0000 mg | Freq: Four times a day (QID) | INTRAMUSCULAR | Status: DC | PRN

## 2016-01-30 MED ORDER — ENALAPRIL MALEATE 10 MG PO TABS
10.0000 mg | ORAL_TABLET | Freq: Every day | ORAL | Status: DC
  Administered 2016-01-31 – 2016-02-04 (×4): 10 mg via ORAL
  Filled 2016-01-30 (×3): qty 2
  Filled 2016-01-30: qty 1

## 2016-01-30 MED ORDER — ZOLPIDEM TARTRATE 5 MG PO TABS
5.0000 mg | ORAL_TABLET | Freq: Once | ORAL | Status: AC
  Administered 2016-01-30: 5 mg via ORAL
  Filled 2016-01-30: qty 1

## 2016-01-30 MED ORDER — SODIUM CHLORIDE 0.9 % IV SOLN
INTRAVENOUS | Status: DC | PRN
  Administered 2016-01-30: 13:00:00 via INTRAVENOUS

## 2016-01-30 MED ORDER — RIVAROXABAN 20 MG PO TABS
20.0000 mg | ORAL_TABLET | Freq: Every day | ORAL | Status: DC
  Administered 2016-01-30: 20 mg via ORAL
  Filled 2016-01-30: qty 1

## 2016-01-30 MED ORDER — FUROSEMIDE 40 MG PO TABS
40.0000 mg | ORAL_TABLET | Freq: Every day | ORAL | Status: DC
  Administered 2016-01-30 – 2016-02-02 (×4): 40 mg via ORAL
  Filled 2016-01-30 (×4): qty 1

## 2016-01-30 MED ORDER — PROPOFOL 10 MG/ML IV BOLUS
INTRAVENOUS | Status: DC | PRN
  Administered 2016-01-30: 100 mg via INTRAVENOUS

## 2016-01-30 MED ORDER — ATORVASTATIN CALCIUM 10 MG PO TABS
10.0000 mg | ORAL_TABLET | Freq: Every day | ORAL | Status: DC
  Administered 2016-01-30 – 2016-02-03 (×5): 10 mg via ORAL
  Filled 2016-01-30 (×5): qty 1

## 2016-01-30 MED ORDER — NITROGLYCERIN 0.4 MG SL SUBL
0.4000 mg | SUBLINGUAL_TABLET | SUBLINGUAL | Status: DC | PRN
  Administered 2016-01-31: 0.4 mg via SUBLINGUAL

## 2016-01-30 MED ORDER — MOMETASONE FURO-FORMOTEROL FUM 200-5 MCG/ACT IN AERO
2.0000 | INHALATION_SPRAY | Freq: Two times a day (BID) | RESPIRATORY_TRACT | Status: DC
  Administered 2016-01-31 – 2016-02-04 (×9): 2 via RESPIRATORY_TRACT
  Filled 2016-01-30 (×2): qty 8.8

## 2016-01-30 MED ORDER — ACETAMINOPHEN 325 MG PO TABS
650.0000 mg | ORAL_TABLET | ORAL | Status: DC | PRN
  Administered 2016-01-30 – 2016-02-01 (×3): 650 mg via ORAL
  Filled 2016-01-30 (×3): qty 2

## 2016-01-30 MED ORDER — SODIUM CHLORIDE 0.9 % IV SOLN
INTRAVENOUS | Status: DC
  Administered 2016-01-30: 14:00:00 via INTRAVENOUS

## 2016-01-30 MED ORDER — PANTOPRAZOLE SODIUM 40 MG PO TBEC
40.0000 mg | DELAYED_RELEASE_TABLET | Freq: Every day | ORAL | Status: DC
  Administered 2016-01-31 – 2016-02-04 (×5): 40 mg via ORAL
  Filled 2016-01-30 (×5): qty 1

## 2016-01-30 MED ORDER — METOPROLOL SUCCINATE ER 25 MG PO TB24
25.0000 mg | ORAL_TABLET | Freq: Two times a day (BID) | ORAL | Status: DC
  Administered 2016-01-30 – 2016-02-04 (×10): 25 mg via ORAL
  Filled 2016-01-30 (×10): qty 1

## 2016-01-30 MED ORDER — OXYBUTYNIN CHLORIDE 5 MG PO TABS
2.5000 mg | ORAL_TABLET | Freq: Two times a day (BID) | ORAL | Status: DC | PRN
  Filled 2016-01-30: qty 0.5

## 2016-01-30 MED ORDER — ALBUTEROL SULFATE (2.5 MG/3ML) 0.083% IN NEBU: 3.0000 mL | INHALATION_SOLUTION | RESPIRATORY_TRACT | Status: DC | PRN

## 2016-01-30 MED ORDER — LIDOCAINE HCL (CARDIAC) 20 MG/ML IV SOLN
INTRAVENOUS | Status: DC | PRN
  Administered 2016-01-30: 60 mg via INTRAVENOUS

## 2016-01-30 NOTE — Interval H&P Note (Signed)
History and Physical Interval Note:  01/30/2016 1:34 PM  Austin Woods  has presented today for surgery, with the diagnosis of AFIB  The various methods of treatment have been discussed with the patient and family. After consideration of risks, benefits and other options for treatment, the patient has consented to  Procedure(s): CARDIOVERSION (N/A) as a surgical intervention .  The patient's history has been reviewed, patient examined, no change in status, stable for surgery.  I have reviewed the patient's chart and labs.  Questions were answered to the patient's satisfaction.     Fransico Him

## 2016-01-30 NOTE — Transfer of Care (Signed)
Immediate Anesthesia Transfer of Care Note  Patient: Austin Woods  Procedure(s) Performed: Procedure(s): CARDIOVERSION (N/A)  Patient Location: PACU  Anesthesia Type:General  Level of Consciousness: awake, alert , oriented and patient cooperative  Airway & Oxygen Therapy: Patient Spontanous Breathing and Patient connected to nasal cannula oxygen  Post-op Assessment: Report given to RN, Post -op Vital signs reviewed and stable and Patient moving all extremities  Post vital signs: Reviewed and stable  Last Vitals:  Vitals:   01/30/16 1313  BP: (!) 152/105  Pulse: 92  Resp: (!) 22  Temp: 36.5 C    Last Pain:  Vitals:   01/30/16 1346  TempSrc:   PainSc: 8          Complications: No apparent anesthesia complications

## 2016-01-30 NOTE — Progress Notes (Signed)
Patient complains of 8/10 squeezing chest pain post cardioversion.  He says that he has been having this for 2 weeks after getting the flu shot and thought it was a cold.  The CP waxes and wanes in severity but never goes completely away.  He says that it radiates to his left arm and for the past week he has been having numbness and tingling in his left hand.  He also has been having SOB.  This am he says that he felt so bad he could not walk into the hospital and had to come in in a wheelchair, although he did not relay this info to me or the staff until after the DCCV.  Will admit to observation tele bed.  Unfortunately cardiac enzymes will likely be elevated due to cardioversion but will cycle.  Will get 2D echo to assess for RWMAs and rule out pericardial effusion.  He is anticoagulated with Xarelto.  Will make NPO after MN for nuclear stress test in am.

## 2016-01-30 NOTE — CV Procedure (Signed)
   Electrical Cardioversion Procedure Note Austin Woods [id number] 03-Jun-1965  Procedure: Electrical Cardioversion Indications:  Atrial Fibrillation  Time Out: Verified patient identification, verified procedure,medications/allergies/relevent history reviewed, required imaging and test results available.  Performed  Procedure Details  The patient was NPO after midnight. Anesthesia was administered at the beside  by Dr.Fitzgerald with 100mg  of propofol and 60mg  IV Lidocaine.  Cardioversion was done with synchronized biphasic defibrillation with AP pads with 150watts.  The patient did not convert to normal sinus rhythm. Cardioversion was done again with synchronized biphasic defibrillation with AP pads with 200watts.  The patient converted to normal sinus rhythm.  The patient tolerated the procedure well   IMPRESSION:  Successful cardioversion of atrial fibrillation    Austin Woods 01/30/2016, 1:33 PM

## 2016-01-30 NOTE — Anesthesia Preprocedure Evaluation (Addendum)
Anesthesia Evaluation  Patient identified by MRN, date of birth, ID band Patient awake    Reviewed: Allergy & Precautions, H&P , NPO status , Patient's Chart, lab work & pertinent test results, reviewed documented beta blocker date and time   History of Anesthesia Complications (+) Emergence Delirium  Airway Mallampati: III  TM Distance: >3 FB Neck ROM: Full    Dental no notable dental hx. (+) Teeth Intact, Dental Advisory Given   Pulmonary asthma , sleep apnea , former smoker,    Pulmonary exam normal breath sounds clear to auscultation       Cardiovascular hypertension, Pt. on medications and Pt. on home beta blockers  Rhythm:Irregular Rate:Tachycardia     Neuro/Psych negative neurological ROS  negative psych ROS   GI/Hepatic Neg liver ROS, GERD  Medicated and Controlled,  Endo/Other  negative endocrine ROS  Renal/GU negative Renal ROS  negative genitourinary   Musculoskeletal   Abdominal   Peds  Hematology negative hematology ROS (+)   Anesthesia Other Findings   Reproductive/Obstetrics negative OB ROS                            Anesthesia Physical Anesthesia Plan  ASA: III  Anesthesia Plan: General   Post-op Pain Management:    Induction: Intravenous  Airway Management Planned: Mask  Additional Equipment:   Intra-op Plan:   Post-operative Plan:   Informed Consent: I have reviewed the patients History and Physical, chart, labs and discussed the procedure including the risks, benefits and alternatives for the proposed anesthesia with the patient or authorized representative who has indicated his/her understanding and acceptance.   Dental advisory given  Plan Discussed with: CRNA  Anesthesia Plan Comments:         Anesthesia Quick Evaluation

## 2016-01-30 NOTE — Anesthesia Postprocedure Evaluation (Signed)
Anesthesia Post Note  Patient: Austin Woods  Procedure(s) Performed: Procedure(s) (LRB): CARDIOVERSION (N/A)  Patient location during evaluation: PACU Anesthesia Type: General Level of consciousness: awake and alert Pain management: pain level controlled Vital Signs Assessment: post-procedure vital signs reviewed and stable Respiratory status: spontaneous breathing, nonlabored ventilation, respiratory function stable and patient connected to nasal cannula oxygen Cardiovascular status: blood pressure returned to baseline and stable Postop Assessment: no signs of nausea or vomiting Anesthetic complications: no    Last Vitals:  Vitals:   01/30/16 1313 01/30/16 1350  BP: (!) 152/105 (!) 151/98  Pulse: 92 81  Resp: (!) 22 16  Temp: 36.5 C 36.8 C    Last Pain:  Vitals:   01/30/16 1350  TempSrc: Oral  PainSc:                  Jamaul Heist,W. EDMOND

## 2016-01-30 NOTE — Anesthesia Procedure Notes (Signed)
Date/Time: 01/30/2016 1:36 PM Performed by: Izora Gala Pre-anesthesia Checklist: Patient identified, Emergency Drugs available, Suction available and Patient being monitored Patient Re-evaluated:Patient Re-evaluated prior to inductionOxygen Delivery Method: Circle system utilized Preoxygenation: Pre-oxygenation with 100% oxygen Intubation Type: IV induction Ventilation: Mask ventilation without difficulty Placement Confirmation: positive ETCO2

## 2016-01-30 NOTE — H&P (View-Only) (Signed)
PCP:  Mickie Hillier, MD Primary Cardiologist:  Bronson Ing   50 yo male with h/o afib in afib clinic for evaluation.Marland Kitchen He was contacted by phone re link showing afib burden at 74%.S/p ablation x 2 2014/2015. When he was seen in August, he was in afib, but had not taken BB that day . He reports that he had paroxysmal afib at that point but for last 3-4 weeks has become persistent. He has chest discomfort, lightheadedness, fatigue with afib. BB was decreased in May by Dr. Rayann Heman but will be increased back to bid today. Taking xarelto without bleeding issues.Has failed tikosyn in the past.  Using CPAP recently and does admit makes him feel better.  Positive today for fatigue, lightheadedness, chest discomfort  Past Medical History:  Diagnosis Date  . Asthma   . GERD (gastroesophageal reflux disease)   . High cholesterol   . History of DVT (deep vein thrombosis)    a. left leg following ablation for SVT  . Hypertension   . Persistent atrial fibrillation ([place])    a. Dx 08/2012, xarelto initiated; b. 09/2012 Tikosyn initiated -> DCCV -> Sinus c. PVI 03/2013 d. PVI 03/2014  . Sleep apnea    does not use CPAP (he reports having uvulectomy)   . Wolff-Parkinson-White (WPW) syndrome    a. s/p RFCA 2001 by Dr Caryl Comes, pt reports complicated by post procedure DVT   Past Surgical History:  Procedure Laterality Date  . ATRIAL FIBRILLATION ABLATION  03/23/2013   PVI by DR Rayann Heman for afib  . ATRIAL FIBRILLATION ABLATION N/A 03/23/2013   Procedure: ATRIAL FIBRILLATION ABLATION;  Surgeon: Coralyn Mark, MD;  Location: [place] CATH LAB;  Service: Cardiovascular;  Laterality: N/A;  . ATRIAL FIBRILLATION ABLATION N/A 03/27/2014   PVI Dr Rayann Heman  . BACK SURGERY    . CARDIAC SURGERY    . CARDIOVERSION N/A 08/25/2012   Procedure: CARDIOVERSION;  Surgeon: Thayer Headings, MD;  Location: [facility] ENDOSCOPY;  Service: Cardiovascular;  Laterality: N/A;  . CARDIOVERSION N/A 09/06/2012   Procedure: CARDIOVERSION/Bedside;   Surgeon: Carlena Bjornstad, MD;  Location: [place];  Service: Cardiovascular;  Laterality: N/A;  . CARDIOVERSION N/A 01/12/2014   Procedure: CARDIOVERSION;  Surgeon: Pixie Casino, MD;  Location: [facility] ENDOSCOPY;  Service: Cardiovascular;  Laterality: N/A;  . COLONOSCOPY N/A 12/04/2015   Procedure: COLONOSCOPY;  Surgeon: Rogene Houston, MD;  [facility];  Service: Endoscopy;  Laterality: N/A;  730  . LOOP RECORDER IMPLANT N/A 06/29/2014   Procedure: LOOP RECORDER IMPLANT;  Surgeon: Thompson Grayer, MD;  Location: [facility] CATH LAB;  Service: Cardiovascular;  Laterality: N/A;  . NOSE SURGERY     sleep apnea surgery  . STOMACH SURGERY     reflux, fundoplication  . TEE WITHOUT CARDIOVERSION N/A 08/25/2012   Procedure: TRANSESOPHAGEAL ECHOCARDIOGRAM (TEE);  Surgeon: Thayer Headings, MD;  Location: [place];  Service: Cardiovascular;  Laterality: N/A;  . TEE WITHOUT CARDIOVERSION  03/23/2013   DR ROSS  . TEE WITHOUT CARDIOVERSION N/A 03/23/2013   Procedure: TRANSESOPHAGEAL ECHOCARDIOGRAM (TEE);  Surgeon: Fay Records, MD;  Location: [facility] ENDOSCOPY;  Service: Cardiovascular;  Laterality: N/A;  . TEE WITHOUT CARDIOVERSION N/A 03/26/2014   Procedure: TRANSESOPHAGEAL ECHOCARDIOGRAM (TEE);  Surgeon: Josue Hector, MD;  Location: [facility] ENDOSCOPY;  Service: Cardiovascular;  Laterality: N/A;  . TONSILLECTOMY      Current Outpatient Prescriptions  Medication Sig Dispense Refill  . albuterol (PROVENTIL HFA) 108 (90 BASE) MCG/ACT inhaler Inhale 2 puffs  into the lungs every 4 (four) hours as needed for wheezing or shortness of breath.    Marland Kitchen atorvastatin (LIPITOR) 10 MG tablet Take 10 mg by mouth daily.    . Cholecalciferol (VITAMIN D) 2000 UNITS CAPS Take 2 capsules by mouth daily.     . enalapril (VASOTEC) 10 MG tablet Take 1 tablet by mouth  daily 90 tablet 0  . Fluticasone-Salmeterol (ADVAIR DISKUS) 250-50 MCG/DOSE AEPB Inhale 1 puff into the lungs every 12 (twelve) hours.    . furosemide (LASIX) 40 MG  tablet TAKE 1 TABLET BY MOUTH  DAILY 90 tablet 0  . HYDROcodone-acetaminophen (NORCO/VICODIN) 5-325 MG tablet Take 1 tablet by mouth every 6 (six) hours as needed for moderate pain. 10 tablet 0  . ketoconazole (NIZORAL) 2 % cream Apply 1 application topically 2 (two) times daily. 30 g 4  . meloxicam (MOBIC) 15 MG tablet Take 15 mg by mouth daily as needed for pain.     . metoprolol succinate (TOPROL-XL) 25 MG 24 hr tablet Take 1 tablet (25 mg total) by mouth 2 (two) times daily. 180 tablet 2  . omeprazole (PRILOSEC) 20 MG capsule Take 20 mg by mouth daily.    Marland Kitchen oxybutynin (DITROPAN) 5 MG tablet Take 2.5 mg by mouth 2 (two) times daily as needed for bladder spasms.     . Potassium Chloride ER 20 MEQ TBCR Take 1 tablet by mouth daily. 90 tablet 1  . PROCTOZONE-HC 2.5 % rectal cream Apply 3 times daily 90 g 3  . rivaroxaban (XARELTO) 20 MG TABS tablet Take 1 tablet (20 mg total) by mouth daily with supper. 90 tablet 2  . tadalafil (CIALIS) 5 MG tablet Take 5 mg by mouth daily as needed for erectile dysfunction.    . triamcinolone cream (KENALOG) 0.1 % Apply 1 application topically 2 (two) times daily. Prn rash; use up to 2 weeks 30 g 0   No current facility-administered medications for this encounter.    ROS- he has headaches chronically,dizziness, gerd, continues to gain weight, sinus pressure, itchy eyes, all systems are reviewed and negative except as per HPI above .  Allergies  Allergen Reactions  . Pollen Extract     Runny nose Watery eyes Nasal congestion    Social History   Social History  . Marital status: Married    Spouse name: N/A  . Number of children: 2  . Years of education: N/A   Occupational History  .  [employer]   Social History Main Topics  . Smoking status: Former Smoker    Packs/day: 1.00    Years: 7.00    Types: Cigarettes    Start date: 05/19/2003    Quit date: 08/06/2010  . Smokeless tobacco: Never Used  . Alcohol use No  . Drug use: No  . Sexual  activity: Not on file   Other Topics Concern  . Not on file   Social History Narrative   Pt lives in [place] with wife.  Works at [employer] History  Problem Relation Age of Onset  . CAD Father 34  . Diabetes Father   . Heart attack Father   . Transient ischemic attack Father   . Vascular Disease Father   . Diabetes Mother   . Anemia Mother   . Gout Mother   . Aneurysm Maternal Grandmother   . Stroke Maternal Grandfather   . Diabetes Paternal Grandmother   . Stroke Paternal Grandfather   . Diabetes Brother  Physical Exam: Vitals:   01/29/16 1129  BP: (!) 154/92  BP Location: Left Arm  Patient Position: Sitting  Cuff Size: Normal  Pulse: (!) 115  Weight: 266 lb 6.4 oz (120.8 kg)  Height: 6\' 1"  (1.854 m)    GEN- The patient is well appearing, alert and oriented x 3 today.   Head- normocephalic, atraumatic Eyes-  Sclera clear, conjunctiva pink Ears- hearing intact Oropharynx- clear Neck- supple, no JVP Lymph- no cervical lymphadenopathy Lungs- Clear to ausculation bilaterally, normal work of breathing Heart-Irregular rate and rhythm, no murmurs, rubs or gallops, PMI not laterally displaced GI- soft, NT, ND, + BS Extremities- no clubbing, cyanosis, trace edema MS- no significant deformity or atrophy Skin- no rash or lesion Psych- euthymic mood, full affect Neuro- strength and sensation are intact  ekg today reveals afib at 115 bpm. qrs int 94 ms, qtc 431 ms Epic records reviewed   Assessment and Plan:  1. Persistent symptomatic Afib s/p 2 previous ablations and failed tikosyn, amiodarone, flecainide Will set up for DCCV States no missed doses of xarelto for at least three weeks Increase metoprolol succinate 25 mg   to bid  2. OSA Compliance with CPAP encouraged.  4. Obesity Body mass index is 35.15 kg/m. Stable,has lost 30 lbs in  past   F/u with Dr. Rayann Heman as scheduled 11/1    Geroge Baseman. Safal Halderman, [facility] [address] [place], [place] [address] [phone]

## 2016-01-30 NOTE — Progress Notes (Signed)
After cardioversion, pt complaining of 8/10 pain in chest, chest tightness, SOB and numbness in left hand and legs.  States pain has been going on for 1-2 weeks.  Dr. Radford Pax notified.  Orders received for labs and EKG.  Pt to be admitted to tele observation.  Report called to Janett Billow, receiving RN.  Pt transported to 3W21 via stretcher, with daughter and all belongings.  Vista Lawman, RN

## 2016-01-31 ENCOUNTER — Observation Stay (HOSPITAL_BASED_OUTPATIENT_CLINIC_OR_DEPARTMENT_OTHER): Payer: BLUE CROSS/BLUE SHIELD

## 2016-01-31 ENCOUNTER — Encounter (HOSPITAL_COMMUNITY)
Admission: AD | Disposition: A | Discharge status: Home / Self Care | Payer: Self-pay | Source: Ambulatory Visit | Attending: Cardiology

## 2016-01-31 ENCOUNTER — Other Ambulatory Visit: Payer: Self-pay | Admitting: Family Medicine

## 2016-01-31 ENCOUNTER — Observation Stay (HOSPITAL_COMMUNITY): Payer: BLUE CROSS/BLUE SHIELD

## 2016-01-31 DIAGNOSIS — R079 Chest pain, unspecified: Secondary | ICD-10-CM

## 2016-01-31 DIAGNOSIS — R0789 Other chest pain: Secondary | ICD-10-CM | POA: Diagnosis present

## 2016-01-31 DIAGNOSIS — I200 Unstable angina: Secondary | ICD-10-CM | POA: Diagnosis not present

## 2016-01-31 DIAGNOSIS — Z91048 Other nonmedicinal substance allergy status: Secondary | ICD-10-CM | POA: Diagnosis not present

## 2016-01-31 DIAGNOSIS — Z87891 Personal history of nicotine dependence: Secondary | ICD-10-CM | POA: Diagnosis not present

## 2016-01-31 DIAGNOSIS — J45909 Unspecified asthma, uncomplicated: Secondary | ICD-10-CM | POA: Diagnosis present

## 2016-01-31 DIAGNOSIS — Z8679 Personal history of other diseases of the circulatory system: Secondary | ICD-10-CM | POA: Diagnosis not present

## 2016-01-31 DIAGNOSIS — Z791 Long term (current) use of non-steroidal anti-inflammatories (NSAID): Secondary | ICD-10-CM | POA: Diagnosis not present

## 2016-01-31 DIAGNOSIS — Z823 Family history of stroke: Secondary | ICD-10-CM | POA: Diagnosis not present

## 2016-01-31 DIAGNOSIS — N289 Disorder of kidney and ureter, unspecified: Secondary | ICD-10-CM | POA: Diagnosis not present

## 2016-01-31 DIAGNOSIS — I480 Paroxysmal atrial fibrillation: Secondary | ICD-10-CM | POA: Diagnosis not present

## 2016-01-31 DIAGNOSIS — Z95818 Presence of other cardiac implants and grafts: Secondary | ICD-10-CM | POA: Diagnosis not present

## 2016-01-31 DIAGNOSIS — Z6835 Body mass index (BMI) 35.0-35.9, adult: Secondary | ICD-10-CM | POA: Diagnosis not present

## 2016-01-31 DIAGNOSIS — R072 Precordial pain: Secondary | ICD-10-CM | POA: Diagnosis not present

## 2016-01-31 DIAGNOSIS — I481 Persistent atrial fibrillation: Secondary | ICD-10-CM | POA: Diagnosis not present

## 2016-01-31 DIAGNOSIS — Z7901 Long term (current) use of anticoagulants: Secondary | ICD-10-CM | POA: Diagnosis not present

## 2016-01-31 DIAGNOSIS — Z86718 Personal history of other venous thrombosis and embolism: Secondary | ICD-10-CM | POA: Diagnosis not present

## 2016-01-31 DIAGNOSIS — E7800 Pure hypercholesterolemia, unspecified: Secondary | ICD-10-CM | POA: Diagnosis present

## 2016-01-31 DIAGNOSIS — E669 Obesity, unspecified: Secondary | ICD-10-CM | POA: Diagnosis present

## 2016-01-31 DIAGNOSIS — Z7952 Long term (current) use of systemic steroids: Secondary | ICD-10-CM | POA: Diagnosis not present

## 2016-01-31 DIAGNOSIS — K219 Gastro-esophageal reflux disease without esophagitis: Secondary | ICD-10-CM | POA: Diagnosis present

## 2016-01-31 DIAGNOSIS — G4733 Obstructive sleep apnea (adult) (pediatric): Secondary | ICD-10-CM | POA: Diagnosis not present

## 2016-01-31 DIAGNOSIS — I25110 Atherosclerotic heart disease of native coronary artery with unstable angina pectoris: Secondary | ICD-10-CM | POA: Diagnosis not present

## 2016-01-31 DIAGNOSIS — I10 Essential (primary) hypertension: Secondary | ICD-10-CM | POA: Diagnosis not present

## 2016-01-31 DIAGNOSIS — Z8249 Family history of ischemic heart disease and other diseases of the circulatory system: Secondary | ICD-10-CM | POA: Diagnosis not present

## 2016-01-31 DIAGNOSIS — Z79891 Long term (current) use of opiate analgesic: Secondary | ICD-10-CM | POA: Diagnosis not present

## 2016-01-31 DIAGNOSIS — Z833 Family history of diabetes mellitus: Secondary | ICD-10-CM | POA: Diagnosis not present

## 2016-01-31 LAB — CBC
HCT: 43 % (ref 39.0–52.0)
Hemoglobin: 14.6 g/dL (ref 13.0–17.0)
MCH: 31.7 pg (ref 26.0–34.0)
MCHC: 34 g/dL (ref 30.0–36.0)
MCV: 93.3 fL (ref 78.0–100.0)
PLATELETS: 227 10*3/uL (ref 150–400)
RBC: 4.61 MIL/uL (ref 4.22–5.81)
RDW: 12.7 % (ref 11.5–15.5)
WBC: 11.2 10*3/uL — ABNORMAL HIGH (ref 4.0–10.5)

## 2016-01-31 LAB — LIPID PANEL
CHOL/HDL RATIO: 4.7 ratio
CHOLESTEROL: 145 mg/dL (ref 0–200)
HDL: 31 mg/dL — AB (ref >40)
LDL Cholesterol: 58 mg/dL (ref 0–99)
TRIGLYCERIDES: 280 mg/dL — AB (ref <150)
VLDL: 56 mg/dL — ABNORMAL HIGH (ref 0–40)

## 2016-01-31 LAB — ECHOCARDIOGRAM COMPLETE
Height: 73 in
Weight: 4139.2 oz

## 2016-01-31 LAB — BASIC METABOLIC PANEL
ANION GAP: 12 (ref 5–15)
BUN: 18 mg/dL (ref 6–20)
CO2: 24 mmol/L (ref 22–32)
Calcium: 9.1 mg/dL (ref 8.9–10.3)
Chloride: 103 mmol/L (ref 101–111)
Creatinine, Ser: 1.33 mg/dL — ABNORMAL HIGH (ref 0.61–1.24)
GFR calc Af Amer: >60 mL/min (ref >60)
GFR, EST NON AFRICAN AMERICAN: 60 mL/min — AB (ref >60)
GLUCOSE: 137 mg/dL — AB (ref 65–99)
POTASSIUM: 3.9 mmol/L (ref 3.5–5.1)
SODIUM: 139 mmol/L (ref 135–145)

## 2016-01-31 LAB — TROPONIN I

## 2016-01-31 LAB — APTT: aPTT: 165 seconds (ref 24–36)

## 2016-01-31 LAB — HEPARIN LEVEL (UNFRACTIONATED): Heparin Unfractionated: 1.86 IU/mL — ABNORMAL HIGH (ref 0.30–0.70)

## 2016-01-31 SURGERY — LEFT HEART CATH AND CORONARY ANGIOGRAPHY
Anesthesia: LOCAL

## 2016-01-31 MED ORDER — NITROGLYCERIN 0.4 MG SL SUBL: 0.4000 mg | SUBLINGUAL_TABLET | SUBLINGUAL | Status: DC | PRN

## 2016-01-31 MED ORDER — REGADENOSON 0.4 MG/5ML IV SOLN
INTRAVENOUS | Status: AC
  Filled 2016-01-31: qty 5

## 2016-01-31 MED ORDER — NITROGLYCERIN IN D5W 200-5 MCG/ML-% IV SOLN
0.0000 ug/min | INTRAVENOUS | Status: DC
  Administered 2016-01-31: 5 ug/min via INTRAVENOUS
  Filled 2016-01-31: qty 250

## 2016-01-31 MED ORDER — REGADENOSON 0.4 MG/5ML IV SOLN
0.4000 mg | Freq: Once | INTRAVENOUS | Status: DC
  Filled 2016-01-31: qty 5

## 2016-01-31 MED ORDER — TECHNETIUM TC 99M TETROFOSMIN IV KIT: 10.0000 | PACK | Freq: Once | INTRAVENOUS | Status: DC | PRN

## 2016-01-31 MED ORDER — NITROGLYCERIN 0.4 MG SL SUBL
SUBLINGUAL_TABLET | SUBLINGUAL | Status: AC
  Filled 2016-01-31: qty 1

## 2016-01-31 MED ORDER — HEPARIN (PORCINE) IN NACL 100-0.45 UNIT/ML-% IJ SOLN
1350.0000 [IU]/h | INTRAMUSCULAR | Status: DC
  Filled 2016-01-31 (×3): qty 250

## 2016-01-31 MED ORDER — HEPARIN (PORCINE) IN NACL 100-0.45 UNIT/ML-% IJ SOLN
1700.0000 [IU]/h | INTRAMUSCULAR | Status: DC
  Administered 2016-01-31: 1700 [IU]/h via INTRAVENOUS
  Filled 2016-01-31: qty 250

## 2016-01-31 MED ORDER — PERFLUTREN LIPID MICROSPHERE
INTRAVENOUS | Status: AC
  Filled 2016-01-31: qty 10

## 2016-01-31 MED ORDER — PERFLUTREN LIPID MICROSPHERE
1.0000 mL | INTRAVENOUS | Status: AC | PRN
  Administered 2016-01-31: 4 mL via INTRAVENOUS
  Filled 2016-01-31: qty 10

## 2016-01-31 MED ORDER — REGADENOSON 0.4 MG/5ML IV SOLN: 0.4000 mg | Freq: Once | INTRAVENOUS | Status: DC

## 2016-01-31 MED ORDER — HEPARIN BOLUS VIA INFUSION
4000.0000 [IU] | Freq: Once | INTRAVENOUS | Status: AC
  Administered 2016-01-31: 4000 [IU] via INTRAVENOUS
  Filled 2016-01-31: qty 4000

## 2016-01-31 NOTE — Progress Notes (Deleted)
The patient was seen in nuclear medicine for a Exercise myoview. He tolerated the procedure well. No acute ST or TW changes on ECG.   Jettie Booze, NP  [facility]

## 2016-01-31 NOTE — Progress Notes (Signed)
ANTICOAGULATION CONSULT NOTE - FOLLOW UP    APTT = 165 (goal 66 - 102 sec) Heparin dosing weight = 105 kg   Assessment: 57 YOM with history of DVT and Afib to continue on IV heparin while Xarelto is on hold.  Xarelto may falsely elevate heparin levels; therefore, currently using aPTT to assess heparin therapy.  APTT is supra-therapeutic.  Verified with RN that lab was drawn appropriately.  No bleeding reported.   Plan: - Reduce heparin gtt to 1450 units/hr - Check aPTT in 6 hours   Trenise Turay D. Mina Marble, PharmD, BCPS 01/31/2016, 8:41 PM

## 2016-01-31 NOTE — Progress Notes (Signed)
  Echocardiogram 2D Echocardiogram has been performed.  Jennette Dubin 01/31/2016, 2:49 PM

## 2016-01-31 NOTE — Progress Notes (Signed)
Patient Name: Austin Woods Date of Encounter: 01/31/2016  Primary Cardiologist: Dr. Rayann Heman, Dr. Sundra Aland Problem List     Active Problems:   Persistent atrial fibrillation [facility])   Unstable angina pectoris [facility])   Chest pain     Subjective   Feels well, denies chest pain and SOB  Inpatient Medications    Scheduled Meds: . atorvastatin  10 mg Oral q1800  . enalapril  10 mg Oral Daily  . furosemide  40 mg Oral Daily  . metoprolol succinate  25 mg Oral BID  . mometasone-formoterol  2 puff Inhalation BID  . pantoprazole  40 mg Oral Daily  . rivaroxaban  20 mg Oral Q supper   Continuous Infusions:   PRN Meds: acetaminophen, albuterol, nitroGLYCERIN, ondansetron (ZOFRAN) IV, oxybutynin, technetium tetrofosmin   Vital Signs    Vitals:   01/30/16 2024 01/30/16 2215 01/31/16 0627 01/31/16 0855  BP:  119/78 126/79 (!) 138/96  Pulse:  85 (!) 57   Resp:  18 12   Temp: 97.9 F (36.6 C)  97.5 F (36.4 C)   TempSrc: Oral  Oral   SpO2:  98% 98%   Weight:   258 lb 11.2 oz (117.3 kg)   Height:        Intake/Output Summary (Last 24 hours) at 01/31/16 0959 Last data filed at 01/30/16 2245  Gross per 24 hour  Intake              730 ml  Output              900 ml  Net             -170 ml   Filed Weights   01/30/16 1313 01/30/16 1539 01/31/16 0627  Weight: 266 lb (120.7 kg) 260 lb 3.2 oz (118 kg) 258 lb 11.2 oz (117.3 kg)    Physical Exam   GEN: Well nourished, well developed, in no acute distress.  HEENT: Grossly normal.  Neck: Supple, no JVD, carotid bruits, or masses. Cardiac: RRR, no murmurs, rubs, or gallops. No clubbing, cyanosis, edema.  Radials/DP/PT 2+ and equal bilaterally.  Respiratory:  Respirations regular and unlabored, clear to auscultation bilaterally. GI: Soft, nontender, nondistended, BS + x 4. MS: no deformity or atrophy. Skin: warm and dry, no rash. Neuro:  Strength and sensation are intact. Psych: AAOx3.  Normal affect.  Labs    CBC  Recent Labs  01/29/16 1154 01/31/16 0308  WBC 8.0 11.2*  HGB 16.0 14.6  HCT 45.9 43.0  MCV 91.6 93.3  PLT 253 [id number]   Basic Metabolic Panel  Recent Labs  01/29/16 1154 01/31/16 0308  NA 138 139  K 4.1 3.9  CL 103 103  CO2 25 24  GLUCOSE 126* 137*  BUN 17 18  CREATININE 1.21 1.33*  CALCIUM 9.5 9.1   Cardiac Enzymes  Recent Labs  01/30/16 1409 01/30/16 1541 01/30/16 2146 01/31/16 0308  CKTOTAL 69  --   --   --   TROPONINI <0.03 <0.03 <0.03 <0.03   Fasting Lipid Panel  Recent Labs  01/31/16 0308  CHOL 145  HDL 31*  LDLCALC 58  TRIG 280*  CHOLHDL 4.7    Telemetry    NSR - Personally Reviewed  ECG    NSR- Personally Reviewed  Radiology    No results found.  Cardiac Studies     Patient Profile     Austin Woods is a 50 year old male with a past medical history  of HTN, persistent afib followed in Afib clinic on Xarelto, OSA, and WPW syndrome s/p RFCA in 2001. He underwent DCCV yesterday with Dr. Radford Pax and had squeezing chest pain post procedure. He was admitted and referred for nuclear stress testing.    Assessment & Plan    1. Chest pain with moderate risk of cardiac etiology: Seen in nuclear med, he was walking on treadmill for 5 min, and developed chest pain, was pale and diaphoretic. He was given one SL Nitro and his chest pain was relieved.   He will need heart cath, however, he is on anticoagulation for Afib. We will stop his Xarelto and start a heparin gtt.   He says for the past 2 weeks he has gotten substernal chest pain with exertion and at rest. His troponins are negative x 3. EKG does not have any acute ST/T wave changes.   2. History of Afib: S/p DCCV yesterday. In NSR today. Continue metoprolol.  3. HTN: Well controlled.   4. HLD: LDL at goal, triglycerides are 280. Can add fish oil to his regimen.    Signed, Arbutus Leas, NP  01/31/2016, 9:59 AM  Patient seen and examined and history reviewed. Agree with above  findings and plan. Admitted and underwent DCCV yesterday on Xarelto. Post CV developed chest pain and tightness. No Ecg changes and troponin levels normal. Scheduled for stress Myoview today. Developed severe chest pain and tightness on treadmill. No Ecg changes but became ashen and diaphoretic. Stress test cancelled. Pain improved with sl Ntg. Still has some tightness. Given above symptoms I would recommend invasive evaluation with cardiac cath. Since he received Xarelto last night he is not a candidate for that today. Will hold Xarelto. Start IV heparin and Ntg. Plan cardiac cath on Monday. The procedure and risks were reviewed including but not limited to death, myocardial infarction, stroke, arrythmias, bleeding, transfusion, emergency surgery, dye allergy, or renal dysfunction. The patient voices understanding and is agreeable to proceed.   Austin Woods, Austin Woods 01/31/2016 10:41 AM

## 2016-01-31 NOTE — Progress Notes (Signed)
ANTICOAGULATION CONSULT NOTE - Initial Consult  Pharmacy Consult for Heparin Indication: Bridge therapy while off rivaroxaban  Allergies  Allergen Reactions  . Pollen Extract     Runny nose Watery eyes Nasal congestion    Patient Measurements: Height: 6\' 1"  (185.4 cm) Weight: 258 lb 11.2 oz (117.3 kg) IBW/kg (Calculated) : 79.9 Heparin Dosing Weight: 105.1kg  Vital Signs: Temp: 97.5 F (36.4 C) (10/27 0627) Temp Source: Oral (10/27 0627) BP: 146/98 (10/27 1010) Pulse Rate: 57 (10/27 0627)  Labs:  Recent Labs  01/29/16 1154  01/30/16 1409 01/30/16 1541 01/30/16 2146 01/31/16 0308  HGB 16.0  --   --   --   --  14.6  HCT 45.9  --   --   --   --  43.0  PLT 253  --   --   --   --  227  CREATININE 1.21  --   --   --   --  1.33*  CKTOTAL  --   --  69  --   --   --   TROPONINI  --   < > <0.03 <0.03 <0.03 <0.03  < > = values in this interval not displayed.  Estimated Creatinine Clearance: 87.2 mL/min (by C-G formula based on SCr of 1.33 mg/dL (H)).  Medical History: Past Medical History:  Diagnosis Date  . Asthma   . GERD (gastroesophageal reflux disease)   . Headache   . High cholesterol   . History of DVT (deep vein thrombosis)    a. left leg following ablation for SVT  . Hypertension   . Persistent atrial fibrillation ([place])    a. Dx 08/2012, xarelto initiated; b. 09/2012 Tikosyn initiated -> DCCV -> Sinus c. PVI 03/2013 d. PVI 03/2014  . Sleep apnea    does not use CPAP (he reports having uvulectomy)   . Wolff-Parkinson-White (WPW) syndrome    a. s/p RFCA 2001 by Dr Caryl Comes, pt reports complicated by post procedure DVT   Assessment: 50 yo male admitted for cardiac study and had chest pain during procedure.  He was on rivaroxaban prior to admit for hx. of DVT and persistent Afib.  Current plan is to hold this and bridge with Heparin while his cardiac work up is completed.  His last dose of rivaroxaban was on 10/25.  There will be some interference of her coag labs  due to this.  Currently CBC:  H/H 14.6/43.0 and platelet 227K.  Goal of Therapy:  aPTT 66-105 sec - will use Heparin levels once interference from rivaroxaban clears. Monitor platelets by anticoagulation protocol: Yes   Plan:  Heparin 4000 units x 1  Heparin 1700 units/hr continuous infusion Obtain a heparin level 8 hours after starting Daily heparin levels and CBC  Rober Minion, PharmD., MS Clinical Pharmacist Pager:  [phone] Thank you for allowing pharmacy to be part of this patients care team. 01/31/2016,10:38 AM

## 2016-02-01 LAB — BASIC METABOLIC PANEL
Anion gap: 9 (ref 5–15)
BUN: 18 mg/dL (ref 6–20)
CALCIUM: 9.6 mg/dL (ref 8.9–10.3)
CO2: 28 mmol/L (ref 22–32)
CREATININE: 1.26 mg/dL — AB (ref 0.61–1.24)
Chloride: 104 mmol/L (ref 101–111)
Glucose, Bld: 150 mg/dL — ABNORMAL HIGH (ref 65–99)
Potassium: 4.1 mmol/L (ref 3.5–5.1)
SODIUM: 141 mmol/L (ref 135–145)

## 2016-02-01 LAB — APTT
APTT: 144 s — AB (ref 24–36)
APTT: 66 s — AB (ref 24–36)
aPTT: 70 seconds — ABNORMAL HIGH (ref 24–36)

## 2016-02-01 LAB — HEPARIN LEVEL (UNFRACTIONATED)
HEPARIN UNFRACTIONATED: 0.53 [IU]/mL (ref 0.30–0.70)
HEPARIN UNFRACTIONATED: 0.35 [IU]/mL (ref 0.30–0.70)
Heparin Unfractionated: 1.2 IU/mL — ABNORMAL HIGH (ref 0.30–0.70)

## 2016-02-01 LAB — CBC
HEMATOCRIT: 43 % (ref 39.0–52.0)
HEMOGLOBIN: 14.8 g/dL (ref 13.0–17.0)
MCH: 31.8 pg (ref 26.0–34.0)
MCHC: 34.4 g/dL (ref 30.0–36.0)
MCV: 92.5 fL (ref 78.0–100.0)
Platelets: 231 10*3/uL (ref 150–400)
RBC: 4.65 MIL/uL (ref 4.22–5.81)
RDW: 12.7 % (ref 11.5–15.5)
WBC: 10.6 10*3/uL — AB (ref 4.0–10.5)

## 2016-02-01 NOTE — Progress Notes (Signed)
ANTICOAGULATION CONSULT NOTE - Follow Up Consult  Pharmacy Consult for Heparin (Xarelto on hold) Indication: atrial fibrillation and Hx DVT  Allergies  Allergen Reactions  . Pollen Extract     Runny nose Watery eyes Nasal congestion    Patient Measurements: Height: 6\' 1"  (185.4 cm) Weight: 258 lb 11.2 oz (117.3 kg) IBW/kg (Calculated) : 79.9  Vital Signs: Temp: 98.1 F (36.7 C) (10/28 0500) Temp Source: Oral (10/28 0500) BP: 102/58 (10/28 0500) Pulse Rate: 54 (10/28 0500)  Labs:  Recent Labs  01/29/16 1154  01/30/16 1409 01/30/16 1541 01/30/16 2146 01/31/16 0308 01/31/16 1910 01/31/16 1954 02/01/16 0456  HGB 16.0  --   --   --   --  14.6  --   --  14.8  HCT 45.9  --   --   --   --  43.0  --   --  43.0  PLT 253  --   --   --   --  227  --   --  231  APTT  --   --   --   --   --   --   --  165* 144*  HEPARINUNFRC  --   --   --   --   --   --  1.86*  --  1.20*  CREATININE 1.21  --   --   --   --  1.33*  --   --   --   CKTOTAL  --   --  69  --   --   --   --   --   --   TROPONINI  --   < > <0.03 <0.03 <0.03 <0.03  --   --   --   < > = values in this interval not displayed.  Estimated Creatinine Clearance: 87.2 mL/min (by C-G formula based on SCr of 1.33 mg/dL (H)).  Assessment: Heparin while Xarelto on hold, plan for cath Monday, aPTT is high this AM, using aPTT to dose for now given Xarelto influence on anti-Xa levels. No issues per RN.   Goal of Therapy:  Heparin level 0.3-0.7 units/ml aPTT 66-102 seconds Monitor platelets by anticoagulation protocol: Yes   Plan:  -Decrease heparin to 1200 units/hr -1500 aPTT/HL  Narda Bonds 02/01/2016,6:30 AM

## 2016-02-01 NOTE — Progress Notes (Signed)
ANTICOAGULATION CONSULT NOTE - Follow Up Consult  Pharmacy Consult for Heparin (Xarelto on hold) Indication: atrial fibrillation and Hx DVT  Allergies  Allergen Reactions  . Pollen Extract     Runny nose Watery eyes Nasal congestion    Patient Measurements: Height: 6\' 1"  (185.4 cm) Weight: 258 lb 11.2 oz (117.3 kg) IBW/kg (Calculated) : 79.9  Vital Signs: Temp: 98.2 F (36.8 C) (10/28 2015) Temp Source: Oral (10/28 2015) BP: 118/74 (10/28 2015) Pulse Rate: 64 (10/28 2015)  Labs:  Recent Labs  01/30/16 1409 01/30/16 1541 01/30/16 2146 01/31/16 0308  02/01/16 0456 02/01/16 0915 02/01/16 1403 02/01/16 2132  HGB  --   --   --  14.6  --  14.8  --   --   --   HCT  --   --   --  43.0  --  43.0  --   --   --   PLT  --   --   --  227  --  231  --   --   --   APTT  --   --   --   --   < > 144*  --  70* 66*  HEPARINUNFRC  --   --   --   --   < > 1.20*  --  0.53 0.35  CREATININE  --   --   --  1.33*  --   --  1.26*  --   --   CKTOTAL 69  --   --   --   --   --   --   --   --   TROPONINI <0.03 <0.03 <0.03 <0.03  --   --   --   --   --   < > = values in this interval not displayed.  Estimated Creatinine Clearance: 92.1 mL/min (by C-G formula based on SCr of 1.26 mg/dL (H)).  Assessment: Heparin while Xarelto on hold, plan for cath Monday. Last dose of Xarelto; 10/25. APTT and Heparin level both remain in therapeutic range this PM, now correlating. CBC remains stable and no signs of bleeding noted.  Goal of Therapy:  Heparin level 0.3-0.7 units/ml aPTT 66-102 seconds Monitor platelets by anticoagulation protocol: Yes   Plan:  - Continue heparin at 1200 units/hr - Daily heparin level/CBC - Monitor for signs and symptoms of bleeding - Cath for 10/30   Elicia Lamp, PharmD, BCPS Clinical Pharmacist 02/01/2016 10:10 PM

## 2016-02-01 NOTE — Progress Notes (Signed)
Subjective:  Still complains of soreness in his chest particularly if he takes a deep breath, constant tightness, no shortness of breath  Objective:  Vital Signs in the last 24 hours: BP (!) 102/58 (BP Location: Left Arm)   Pulse (!) 54   Temp 98.1 F (36.7 C) (Oral)   Resp 16   Ht 6\' 1"  (1.854 m)   Wt 117.3 kg (258 lb 11.2 oz)   SpO2 97%   BMI 34.13 kg/m   Physical Exam: Mildly obese male currently in no acute distress Lungs:  Clear Cardiac:  Regular rhythm, normal S1 and S2, no S3 Extremities:  No edema present  Intake/Output from previous day: 10/27 0701 - 10/28 0700 In: 338.9 [P.O.:180; I.V.:158.9] Out: 2650 [Urine:2650]  Weight Filed Weights   01/30/16 1313 01/30/16 1539 01/31/16 0627  Weight: 120.7 kg (266 lb) 118 kg (260 lb 3.2 oz) 117.3 kg (258 lb 11.2 oz)    Lab Results: Basic Metabolic Panel:  Recent Labs  01/29/16 1154 01/31/16 0308  NA 138 139  K 4.1 3.9  CL 103 103  CO2 25 24  GLUCOSE 126* 137*  BUN 17 18  CREATININE 1.21 1.33*   CBC:  Recent Labs  01/31/16 0308 02/01/16 0456  WBC 11.2* 10.6*  HGB 14.6 14.8  HCT 43.0 43.0  MCV 93.3 92.5  PLT 227 231   Cardiac Panel (last 3 results)  Recent Labs  01/30/16 1409 01/30/16 1541 01/30/16 2146 01/31/16 0308  CKTOTAL 69  --   --   --   TROPONINI <0.03 <0.03 <0.03 <0.03    Telemetry: Sinus rhythm  Assessment/Plan:  1.  Continued chest tightness with moderate risk of cardiac etiology.  Catheterization is planned for Monday.  He is currently on IV heparin following cessation of Xarelto 2.  Recent cardioversion for atrial fibrillation currently in sinus rhythm 3.  Hypertension controlled  Recommendations:  Continue heparin and awaiting catheterization on Monday.     Kerry Hough  MD [facility] Cardiology  02/01/2016, 9:36 AM

## 2016-02-01 NOTE — Progress Notes (Signed)
ANTICOAGULATION CONSULT NOTE - Follow Up Consult  Pharmacy Consult for Heparin (Xarelto on hold) Indication: atrial fibrillation and Hx DVT  Allergies  Allergen Reactions  . Pollen Extract     Runny nose Watery eyes Nasal congestion    Patient Measurements: Height: 6\' 1"  (185.4 cm) Weight: 258 lb 11.2 oz (117.3 kg) IBW/kg (Calculated) : 79.9  Vital Signs: Temp: 98.6 F (37 C) (10/28 1406) Temp Source: Oral (10/28 1406) BP: 113/61 (10/28 1406) Pulse Rate: 59 (10/28 1406)  Labs:  Recent Labs  01/30/16 1409 01/30/16 1541 01/30/16 2146 01/31/16 0308 01/31/16 1910 01/31/16 1954 02/01/16 0456 02/01/16 0915 02/01/16 1403  HGB  --   --   --  14.6  --   --  14.8  --   --   HCT  --   --   --  43.0  --   --  43.0  --   --   PLT  --   --   --  227  --   --  231  --   --   APTT  --   --   --   --   --  165* 144*  --  70*  HEPARINUNFRC  --   --   --   --  1.86*  --  1.20*  --  0.53  CREATININE  --   --   --  1.33*  --   --   --  1.26*  --   CKTOTAL 69  --   --   --   --   --   --   --   --   TROPONINI <0.03 <0.03 <0.03 <0.03  --   --   --   --   --     Estimated Creatinine Clearance: 92.1 mL/min (by C-G formula based on SCr of 1.26 mg/dL (H)).  Assessment: Heparin while Xarelto on hold, plan for cath Monday. Last dose of Xarelto; 10/25. APTT and Heparin level both in therapeutic range this PM. Appears that they may now be correlating. CBC remains stable and no signs of bleeding noted. Will continue current regimen and get confirmatory levels in 6 hours.   Goal of Therapy:  Heparin level 0.3-0.7 units/ml aPTT 66-102 seconds Monitor platelets by anticoagulation protocol: Yes   Plan:  -Continue heparin at 1200 units/hr - 6 hour aPTT/Heparin Level - Daily heparin level/CBC - Monitor for signs and symptoms of bleeding.  Ihor Austin, PharmD PGY1 Pharmacy Resident [phone] 02/01/2016,3:36 PM

## 2016-02-02 LAB — CBC
HEMATOCRIT: 43.5 % (ref 39.0–52.0)
HEMOGLOBIN: 14.8 g/dL (ref 13.0–17.0)
MCH: 31.4 pg (ref 26.0–34.0)
MCHC: 34 g/dL (ref 30.0–36.0)
MCV: 92.4 fL (ref 78.0–100.0)
Platelets: 230 10*3/uL (ref 150–400)
RBC: 4.71 MIL/uL (ref 4.22–5.81)
RDW: 12.6 % (ref 11.5–15.5)
WBC: 10.4 10*3/uL (ref 4.0–10.5)

## 2016-02-02 LAB — HEPARIN LEVEL (UNFRACTIONATED): HEPARIN UNFRACTIONATED: 0.3 [IU]/mL (ref 0.30–0.70)

## 2016-02-02 MED ORDER — SODIUM CHLORIDE 0.9% FLUSH: 3.0000 mL | INTRAVENOUS | Status: DC | PRN

## 2016-02-02 MED ORDER — ASPIRIN 81 MG PO CHEW
81.0000 mg | CHEWABLE_TABLET | ORAL | Status: AC
  Administered 2016-02-03: 81 mg via ORAL
  Filled 2016-02-02: qty 1

## 2016-02-02 MED ORDER — SODIUM CHLORIDE 0.9 % IV SOLN: 250.0000 mL | INTRAVENOUS | Status: DC | PRN

## 2016-02-02 MED ORDER — ZOLPIDEM TARTRATE 5 MG PO TABS
5.0000 mg | ORAL_TABLET | Freq: Every evening | ORAL | Status: AC | PRN
  Administered 2016-02-02: 5 mg via ORAL
  Filled 2016-02-02: qty 1

## 2016-02-02 MED ORDER — SODIUM CHLORIDE 0.9 % IV SOLN
INTRAVENOUS | Status: DC
  Administered 2016-02-02 – 2016-02-03 (×2): via INTRAVENOUS

## 2016-02-02 MED ORDER — SODIUM CHLORIDE 0.9% FLUSH: 3.0000 mL | Freq: Two times a day (BID) | INTRAVENOUS | Status: DC

## 2016-02-02 NOTE — Progress Notes (Signed)
Subjective:  Still complaining of vague soreness and heaviness in his chest.  Enzymes are negative and he remains in sinus rhythm.  Continues on IV heparin and IV nitroglycerin.  Objective:  Vital Signs in the last 24 hours: BP (!) 118/54 (BP Location: Left Arm)   Pulse 60   Temp 98.2 F (36.8 C) (Oral)   Resp 12   Ht 6\' 1"  (1.854 m)   Wt 117.3 kg (258 lb 11.2 oz)   SpO2 97%   BMI 34.13 kg/m   Physical Exam: Mildly obese male currently in no acute distress Lungs:  Clear Cardiac:  Regular rhythm, normal S1 and S2, no S3 Extremities:  No edema present  Intake/Output from previous day: 10/28 0701 - 10/29 0700 In: 640 [P.O.:480; I.V.:160] Out: 550 [Urine:550]  Weight Filed Weights   01/30/16 1313 01/30/16 1539 01/31/16 0627  Weight: 120.7 kg (266 lb) 118 kg (260 lb 3.2 oz) 117.3 kg (258 lb 11.2 oz)    Lab Results: Basic Metabolic Panel:  Recent Labs  01/31/16 0308 02/01/16 0915  NA 139 141  K 3.9 4.1  CL 103 104  CO2 24 28  GLUCOSE 137* 150*  BUN 18 18  CREATININE 1.33* 1.26*   CBC:  Recent Labs  02/01/16 0456 02/02/16 0437  WBC 10.6* 10.4  HGB 14.8 14.8  HCT 43.0 43.5  MCV 92.5 92.4  PLT 231 230   Cardiac Panel (last 3 results)  Recent Labs  01/30/16 1409 01/30/16 1541 01/30/16 2146 01/31/16 0308  CKTOTAL 69  --   --   --   TROPONINI <0.03 <0.03 <0.03 <0.03    Telemetry: Sinus rhythm  Assessment/Plan:  1.  Continued chest tightness with moderate risk of cardiac etiology.  Catheterization is planned for Monday.  He is currently on IV heparin following cessation of Xarelto 2.  Recent cardioversion for atrial fibrillation currently in sinus rhythm 3.  Hypertension controlled 4.  Mild renal insufficiency  Recommendations:  Continue heparin and awaiting catheterization on Monday.  Will hydrate overnight.  Check renal function and morning.  Cardiac catheterization was discussed with the patient fully including risks of myocardial infarction,  death, stroke, bleeding, arrhythmia, dye allergy, renal insufficiency or bleeding.  The patient understands and is willing to proceed.  Possibility of intervention at the same time also discussed with patient and he understands and is agreeable to proceed      W. Doristine Church  MD [facility] Cardiology  02/02/2016, 9:47 AM

## 2016-02-02 NOTE — Progress Notes (Signed)
ANTICOAGULATION CONSULT NOTE - Follow Up Consult  Pharmacy Consult for Heparin Indication: atrial fibrillation and Hx DVT  Patient Measurements: Height: 6\' 1"  (185.4 cm) Weight: 258 lb 11.2 oz (117.3 kg) IBW/kg (Calculated) : 79.9  Vital Signs: Temp: 98.2 F (36.8 C) (10/29 0500) Temp Source: Oral (10/29 0500) BP: 118/54 (10/29 0500) Pulse Rate: 60 (10/29 0500)  Labs:  Recent Labs  01/30/16 1409 01/30/16 1541 01/30/16 2146  01/31/16 0308  02/01/16 0456 02/01/16 0915 02/01/16 1403 02/01/16 2132 02/02/16 0437  HGB  --   --   --   < > 14.6  --  14.8  --   --   --  14.8  HCT  --   --   --   --  43.0  --  43.0  --   --   --  43.5  PLT  --   --   --   --  227  --  231  --   --   --  230  APTT  --   --   --   --   --   < > 144*  --  70* 66*  --   HEPARINUNFRC  --   --   --   --   --   < > 1.20*  --  0.53 0.35 0.30  CREATININE  --   --   --   --  1.33*  --   --  1.26*  --   --   --   CKTOTAL 69  --   --   --   --   --   --   --   --   --   --   TROPONINI <0.03 <0.03 <0.03  --  <0.03  --   --   --   --   --   --   < > = values in this interval not displayed.  Estimated Creatinine Clearance: 92.1 mL/min (by C-G formula based on SCr of 1.26 mg/dL (H)).  Assessment: 50yo M with h/o afib and DVT, PTA Xarelto on hold (last dose 10/25), to continue on heparin, cath planned for tomorrow. Heparin level therapeutic at 0.30 - lowest end of goal. CBC remains stable, no bleeding documented.  Goal of Therapy:  Heparin level 0.3-0.7 units/ml Monitor platelets by anticoagulation protocol: Yes   Plan:  - Increase heparin slightly to 1250 units/hr - Daily heparin level, CBC - Monitor for signs and symptoms of bleeding - Cath for 10/30   Gwenlyn Perking, PharmD PGY1 Pharmacy Resident Pager: [phone] 02/02/2016 9:28 AM

## 2016-02-03 ENCOUNTER — Encounter (HOSPITAL_COMMUNITY)
Admission: AD | Disposition: A | Discharge status: Home / Self Care | Payer: Self-pay | Source: Ambulatory Visit | Attending: Cardiology

## 2016-02-03 ENCOUNTER — Encounter (HOSPITAL_COMMUNITY): Payer: Self-pay | Admitting: Internal Medicine

## 2016-02-03 DIAGNOSIS — R072 Precordial pain: Secondary | ICD-10-CM

## 2016-02-03 DIAGNOSIS — I25110 Atherosclerotic heart disease of native coronary artery with unstable angina pectoris: Secondary | ICD-10-CM

## 2016-02-03 HISTORY — PX: CARDIAC CATHETERIZATION: SHX172

## 2016-02-03 LAB — PROTIME-INR
INR: 1.06
Prothrombin Time: 13.8 seconds (ref 11.4–15.2)

## 2016-02-03 LAB — BASIC METABOLIC PANEL
ANION GAP: 8 (ref 5–15)
BUN: 16 mg/dL (ref 6–20)
CALCIUM: 9.1 mg/dL (ref 8.9–10.3)
CO2: 24 mmol/L (ref 22–32)
CREATININE: 1.08 mg/dL (ref 0.61–1.24)
Chloride: 104 mmol/L (ref 101–111)
GLUCOSE: 139 mg/dL — AB (ref 65–99)
Potassium: 3.8 mmol/L (ref 3.5–5.1)
Sodium: 136 mmol/L (ref 135–145)

## 2016-02-03 LAB — CBC
HEMATOCRIT: 46 % (ref 39.0–52.0)
Hemoglobin: 15.4 g/dL (ref 13.0–17.0)
MCH: 31.4 pg (ref 26.0–34.0)
MCHC: 33.5 g/dL (ref 30.0–36.0)
MCV: 93.7 fL (ref 78.0–100.0)
PLATELETS: 222 10*3/uL (ref 150–400)
RBC: 4.91 MIL/uL (ref 4.22–5.81)
RDW: 12.9 % (ref 11.5–15.5)
WBC: 11.9 10*3/uL — AB (ref 4.0–10.5)

## 2016-02-03 LAB — POCT ACTIVATED CLOTTING TIME
ACTIVATED CLOTTING TIME: 279 s
ACTIVATED CLOTTING TIME: 362 s
Activated Clotting Time: 313 seconds

## 2016-02-03 LAB — HEPARIN LEVEL (UNFRACTIONATED): HEPARIN UNFRACTIONATED: 0.24 [IU]/mL — AB (ref 0.30–0.70)

## 2016-02-03 SURGERY — LEFT HEART CATH AND CORONARY ANGIOGRAPHY
Anesthesia: LOCAL

## 2016-02-03 MED ORDER — CLOPIDOGREL BISULFATE 300 MG PO TABS
ORAL_TABLET | ORAL | Status: AC
  Filled 2016-02-03: qty 1

## 2016-02-03 MED ORDER — SODIUM CHLORIDE 0.9 % IV SOLN: INTRAVENOUS | Status: DC

## 2016-02-03 MED ORDER — ONDANSETRON HCL 4 MG/2ML IJ SOLN
INTRAMUSCULAR | Status: AC
  Filled 2016-02-03: qty 2

## 2016-02-03 MED ORDER — VERAPAMIL HCL 2.5 MG/ML IV SOLN
INTRAVENOUS | Status: AC
  Filled 2016-02-03: qty 2

## 2016-02-03 MED ORDER — FENTANYL CITRATE (PF) 100 MCG/2ML IJ SOLN
INTRAMUSCULAR | Status: AC
  Filled 2016-02-03: qty 2

## 2016-02-03 MED ORDER — ALUM & MAG HYDROXIDE-SIMETH 200-200-20 MG/5ML PO SUSP
30.0000 mL | ORAL | Status: DC | PRN
  Administered 2016-02-03: 30 mL via ORAL
  Filled 2016-02-03: qty 30

## 2016-02-03 MED ORDER — ONDANSETRON HCL 4 MG/2ML IJ SOLN
INTRAMUSCULAR | Status: DC | PRN
  Administered 2016-02-03: 4 mg via INTRAVENOUS

## 2016-02-03 MED ORDER — HEPARIN SODIUM (PORCINE) 1000 UNIT/ML IJ SOLN
INTRAMUSCULAR | Status: AC
  Filled 2016-02-03: qty 1

## 2016-02-03 MED ORDER — ANGIOPLASTY BOOK
Freq: Once | Status: AC
  Administered 2016-02-03: 20:00:00
  Filled 2016-02-03: qty 1

## 2016-02-03 MED ORDER — SODIUM CHLORIDE 0.9% FLUSH
3.0000 mL | Freq: Two times a day (BID) | INTRAVENOUS | Status: DC
Start: 2016-02-03 — End: 2016-02-04
  Administered 2016-02-03: 3 mL via INTRAVENOUS

## 2016-02-03 MED ORDER — VERAPAMIL HCL 2.5 MG/ML IV SOLN
INTRAVENOUS | Status: DC | PRN
  Administered 2016-02-03 (×2): 10 mL via INTRA_ARTERIAL

## 2016-02-03 MED ORDER — SODIUM CHLORIDE 0.9% FLUSH
3.0000 mL | INTRAVENOUS | Status: DC | PRN
  Administered 2016-02-03: 3 mL via INTRAVENOUS
  Filled 2016-02-03: qty 3

## 2016-02-03 MED ORDER — HEPARIN (PORCINE) IN NACL 2-0.9 UNIT/ML-% IJ SOLN
INTRAMUSCULAR | Status: AC
  Filled 2016-02-03: qty 1000

## 2016-02-03 MED ORDER — NITROGLYCERIN 1 MG/10 ML FOR IR/CATH LAB
INTRA_ARTERIAL | Status: AC
  Filled 2016-02-03: qty 10

## 2016-02-03 MED ORDER — MIDAZOLAM HCL 2 MG/2ML IJ SOLN
INTRAMUSCULAR | Status: DC | PRN
Start: 2016-02-03 — End: 2016-02-03
  Administered 2016-02-03 (×2): 1 mg via INTRAVENOUS

## 2016-02-03 MED ORDER — CLOPIDOGREL BISULFATE 300 MG PO TABS
ORAL_TABLET | ORAL | Status: DC | PRN
  Administered 2016-02-03: 600 mg via ORAL

## 2016-02-03 MED ORDER — MIDAZOLAM HCL 2 MG/2ML IJ SOLN
INTRAMUSCULAR | Status: AC
  Filled 2016-02-03: qty 2

## 2016-02-03 MED ORDER — FENTANYL CITRATE (PF) 100 MCG/2ML IJ SOLN
INTRAMUSCULAR | Status: DC | PRN
  Administered 2016-02-03 (×2): 50 ug via INTRAVENOUS

## 2016-02-03 MED ORDER — OFF THE BEAT BOOK
Freq: Once | Status: AC
  Administered 2016-02-03: 21:00:00
  Filled 2016-02-03: qty 1

## 2016-02-03 MED ORDER — LIDOCAINE HCL (PF) 1 % IJ SOLN
INTRAMUSCULAR | Status: DC | PRN
Start: 2016-02-03 — End: 2016-02-03
  Administered 2016-02-03: 1 mL via SUBCUTANEOUS

## 2016-02-03 MED ORDER — IOPAMIDOL (ISOVUE-370) INJECTION 76%
INTRAVENOUS | Status: AC
  Filled 2016-02-03: qty 50

## 2016-02-03 MED ORDER — FAMOTIDINE IN NACL 20-0.9 MG/50ML-% IV SOLN
20.0000 mg | Freq: Once | INTRAVENOUS | Status: DC
  Filled 2016-02-03: qty 50

## 2016-02-03 MED ORDER — IOPAMIDOL (ISOVUE-370) INJECTION 76%
INTRAVENOUS | Status: AC
  Filled 2016-02-03: qty 100

## 2016-02-03 MED ORDER — HEPARIN SODIUM (PORCINE) 1000 UNIT/ML IJ SOLN
INTRAMUSCULAR | Status: DC | PRN
  Administered 2016-02-03: 2000 [IU] via INTRAVENOUS
  Administered 2016-02-03: 5000 [IU] via INTRAVENOUS
  Administered 2016-02-03: 2000 [IU] via INTRAVENOUS
  Administered 2016-02-03: 5000 [IU] via INTRAVENOUS

## 2016-02-03 MED ORDER — HEPARIN (PORCINE) IN NACL 2-0.9 UNIT/ML-% IJ SOLN
INTRAMUSCULAR | Status: DC | PRN
  Administered 2016-02-03: 1000 mL

## 2016-02-03 MED ORDER — FUROSEMIDE 40 MG PO TABS
40.0000 mg | ORAL_TABLET | Freq: Every day | ORAL | Status: DC
  Administered 2016-02-04: 40 mg via ORAL
  Filled 2016-02-03 (×2): qty 1

## 2016-02-03 MED ORDER — ASPIRIN 81 MG PO CHEW
81.0000 mg | CHEWABLE_TABLET | Freq: Every day | ORAL | Status: DC
  Administered 2016-02-04: 81 mg via ORAL
  Filled 2016-02-03: qty 1

## 2016-02-03 MED ORDER — VERAPAMIL HCL 2.5 MG/ML IV SOLN
INTRAVENOUS | Status: AC
Start: 2016-02-03 — End: 2016-02-03
  Filled 2016-02-03: qty 2

## 2016-02-03 MED ORDER — IOHEXOL 350 MG/ML SOLN
INTRAVENOUS | Status: DC | PRN
  Administered 2016-02-03: 230 mL via INTRA_ARTERIAL

## 2016-02-03 MED ORDER — HEPARIN (PORCINE) IN NACL 100-0.45 UNIT/ML-% IJ SOLN
1350.0000 [IU]/h | INTRAMUSCULAR | Status: DC
  Administered 2016-02-03: 19:00:00 1350 [IU]/h via INTRAVENOUS
  Filled 2016-02-03: qty 250

## 2016-02-03 MED ORDER — SODIUM CHLORIDE 0.9 % IV SOLN
250.0000 mL | INTRAVENOUS | Status: DC | PRN
  Administered 2016-02-03: 250 mL via INTRAVENOUS

## 2016-02-03 MED ORDER — ADENOSINE 12 MG/4ML IV SOLN
INTRAVENOUS | Status: AC
Start: 2016-02-03 — End: 2016-02-03
  Filled 2016-02-03: qty 16

## 2016-02-03 MED ORDER — ADENOSINE (DIAGNOSTIC) 140MCG/KG/MIN
INTRAVENOUS | Status: DC | PRN
  Administered 2016-02-03: 140 ug/kg/min via INTRAVENOUS

## 2016-02-03 MED ORDER — NITROGLYCERIN 1 MG/10 ML FOR IR/CATH LAB
INTRA_ARTERIAL | Status: DC | PRN
  Administered 2016-02-03 (×2): 200 ug via INTRACORONARY

## 2016-02-03 MED ORDER — CLOPIDOGREL BISULFATE 75 MG PO TABS
75.0000 mg | ORAL_TABLET | Freq: Every day | ORAL | Status: DC
  Administered 2016-02-04: 75 mg via ORAL
  Filled 2016-02-03: qty 1

## 2016-02-03 MED ORDER — LIDOCAINE HCL (PF) 1 % IJ SOLN
INTRAMUSCULAR | Status: AC
  Filled 2016-02-03: qty 30

## 2016-02-03 SURGICAL SUPPLY — 27 items
BALLN EMERGE MR 2.5X12 (BALLOONS) ×2
BALLN EUPHORA RX 2.0X15 (BALLOONS) ×2
BALLN ~~LOC~~ EUPHORA RX 2.0X12 (BALLOONS) ×2
BALLN ~~LOC~~ TREK RX 3.25X12 (BALLOONS) ×2
BALLOON EMERGE MR 2.5X12 (BALLOONS) ×1 IMPLANT
BALLOON EUPHORA RX 2.0X15 (BALLOONS) ×1 IMPLANT
BALLOON ~~LOC~~ EUPHORA RX 2.0X12 (BALLOONS) ×1 IMPLANT
BALLOON ~~LOC~~ TREK RX 3.25X12 (BALLOONS) ×1 IMPLANT
CATH IMPULSE 5F ANG/FL3.5 (CATHETERS) ×2 IMPLANT
CATH LAUNCHER 5F EBU3.5 (CATHETERS) ×2 IMPLANT
CATH LAUNCHER 6FR AL1 (CATHETERS) ×1 IMPLANT
CATH MICROCATH NAVVUS (MICROCATHETER) ×1 IMPLANT
CATHETER LAUNCHER 6FR AL1 (CATHETERS) ×2
DEVICE RAD COMP TR BAND LRG (VASCULAR PRODUCTS) ×2 IMPLANT
GLIDESHEATH SLEND SS 6F .021 (SHEATH) ×2 IMPLANT
KIT ENCORE 26 ADVANTAGE (KITS) ×4 IMPLANT
KIT ESSENTIALS PG (KITS) ×2 IMPLANT
KIT HEART LEFT (KITS) ×2 IMPLANT
MICROCATHETER NAVVUS (MICROCATHETER) ×2
PACK CARDIAC CATHETERIZATION (CUSTOM PROCEDURE TRAY) ×2 IMPLANT
STENT PROMUS PREM MR 3.0X16 (Permanent Stent) ×2 IMPLANT
TRANSDUCER W/STOPCOCK (MISCELLANEOUS) ×2 IMPLANT
TUBING CIL FLEX 10 FLL-RA (TUBING) ×2 IMPLANT
WIRE EMERALD 3MM-J .035X260CM (WIRE) ×2 IMPLANT
WIRE HI TORQ BMW 190CM (WIRE) ×2 IMPLANT
WIRE HI TORQ VERSACORE-J 145CM (WIRE) ×2 IMPLANT
WIRE RUNTHROUGH .014X180CM (WIRE) ×2 IMPLANT

## 2016-02-03 NOTE — Progress Notes (Signed)
TR BAND REMOVAL  LOCATION:    right radial  DEFLATED PER PROTOCOL:    Yes.    TIME BAND OFF / DRESSING APPLIED:    1645   SITE UPON ARRIVAL:    Level 0  SITE AFTER BAND REMOVAL:    Level 0  CIRCULATION SENSATION AND MOVEMENT:    Within Normal Limits   Yes.    COMMENTS:   No change in assessment of site with frequent checks throughout remainder of shift.

## 2016-02-03 NOTE — Interval H&P Note (Signed)
History and Physical Interval Note:  02/03/2016 10:00 AM  Austin Woods  has presented today for cardiac catheteriztion, with the diagnosis of chest pain. The various methods of treatment have been discussed with the patient and family. After consideration of risks, benefits and other options for treatment, the patient has consented to  Procedure(s): Left Heart Cath and Coronary Angiography (N/A) as a surgical intervention .  The patient's history has been reviewed, patient examined, no change in status, stable for surgery.  I have reviewed the patient's chart and labs.  Questions were answered to the patient's satisfaction.    Cath Lab Visit (complete for each Cath Lab visit)  Clinical Evaluation Leading to the Procedure:   ACS: Yes.    Non-ACS:    Anginal Classification: CCS IV  Anti-ischemic medical therapy: Maximal Therapy (2 or more classes of medications)  Non-Invasive Test Results: Equivocal test results (apical defect noted on rest-only images)  Prior CABG: No previous CABG  Austin Woods

## 2016-02-03 NOTE — Care Management Note (Signed)
Case Management Note  Patient Details  Name: AMONTAE PUELLO MRN: [id number] Date of Birth: [birth year]  Subjective/Objective:    S/p coronary stent, will be on plavix and asa, and was already taking xarelto as home med.  NCM will cont to follow for dc needs.                 Action/Plan:   Expected Discharge Date:                  Expected Discharge Plan:  Home/Self Care  In-House Referral:     Discharge planning Services  CM Consult  Post Acute Care Choice:    Choice offered to:     DME Arranged:    DME Agency:     HH Arranged:    HH Agency:     Status of Service:  In process, will continue to follow  If discussed at Long Length of Stay Meetings, dates discussed:    Additional Comments:  Zenon Mayo, RN 02/03/2016, 12:36 PM

## 2016-02-03 NOTE — Progress Notes (Signed)
Mr Hulet was transported via bed with RN, monitor on a Nitro gtt.  Pt experienced no ncident or injury during our transport. His spouse was shown to the waiting room by the Cath Transporter Report was provided to the Cath Room Staff- they denied questions for me.

## 2016-02-03 NOTE — H&P (View-Only) (Signed)
Subjective:  Still with chest soreness; no dyspnea; no palpitations  Objective:  Vital Signs in the last 24 hours: BP 108/64 (BP Location: Left Arm)   Pulse 92   Temp 98.5 F (36.9 C) (Oral)   Resp 18   Ht 6\' 1"  (1.854 m)   Wt 256 lb 11.2 oz (116.4 kg)   SpO2 95%   BMI 33.87 kg/m   Physical Exam: Mildly obese male currently in no acute distress HEENT: normal Neck: supple Lungs:  Clear Cardiac:  Irregular Abd: soft Extremities:  No edema present Neuro: grossly intact  Intake/Output from previous day: 10/29 0701 - 10/30 0700 In: 1724.5 [P.O.:240; I.V.:1484.5] Out: 2850 [Urine:2850]  Weight Filed Weights   01/30/16 1539 01/31/16 0627 02/03/16 0356  Weight: 260 lb 3.2 oz (118 kg) 258 lb 11.2 oz (117.3 kg) 256 lb 11.2 oz (116.4 kg)    Lab Results: Basic Metabolic Panel:  Recent Labs  02/01/16 0915 02/03/16 0344  NA 141 136  K 4.1 3.8  CL 104 104  CO2 28 24  GLUCOSE 150* 139*  BUN 18 16  CREATININE 1.26* 1.08   CBC:  Recent Labs  02/02/16 0437 02/03/16 0344  WBC 10.4 11.9*  HGB 14.8 15.4  HCT 43.5 46.0  MCV 92.4 93.7  PLT 230 222    Telemetry: Sinus rhythm converting to atrial fibrillation at 2 AM  Assessment/Plan:  1.  Continued chest tightness with moderate risk of cardiac etiology.  Catheterization is planned today.  He is currently on IV heparin following cessation of Xarelto. 2.  Recent cardioversion for atrial fibrillation now with recurrent atrial fibrillation following DCCV. 3.  Hypertension controlled 4.  Mild renal insufficiency  Recommendations:  Continue heparin; for cath today.  Cardiac catheterization was discussed with the patient fully including risks of myocardial infarction, death, stroke.  The patient understands and is willing to proceed.  Possibility of intervention at the same time also discussed with patient and he understands and is agreeable to proceed. Pt with recurrent atrial fibrillation; previously failed tikosyn,  amiodarone, flecanide and has had 2 previous ablations; will need EP FU. Continue toprol; will need to resume anticoagulation following cath particularly given recent DCCV.      Austin Ruths  MD [facility] Cardiology  02/03/2016, 9:25 AM

## 2016-02-03 NOTE — Progress Notes (Signed)
Placed patient on CPAP set at 10cm for the night  

## 2016-02-03 NOTE — Progress Notes (Signed)
ANTICOAGULATION CONSULT NOTE - Follow Up Consult  Pharmacy Consult for Heparin (Xarelto on hold) Indication: atrial fibrillation and Hx DVT  Allergies  Allergen Reactions  . Pollen Extract     Runny nose Watery eyes Nasal congestion    Patient Measurements: Height: 6\' 1"  (185.4 cm) Weight: 256 lb 11.2 oz (116.4 kg) IBW/kg (Calculated) : 79.9  Vital Signs: Temp: 98.5 F (36.9 C) (10/30 0856) Temp Source: Oral (10/30 0856) BP: 105/79 (10/30 1149) Pulse Rate: 0 (10/30 1154)  Labs:  Recent Labs  02/01/16 0456 02/01/16 0915 02/01/16 1403 02/01/16 2132 02/02/16 0437 02/03/16 0344 02/03/16 0345  HGB 14.8  --   --   --  14.8 15.4  --   HCT 43.0  --   --   --  43.5 46.0  --   PLT 231  --   --   --  230 222  --   APTT 144*  --  70* 66*  --   --   --   LABPROT  --   --   --   --   --   --  13.8  INR  --   --   --   --   --   --  1.06  HEPARINUNFRC 1.20*  --  0.53 0.35 0.30  --  0.24*  CREATININE  --  1.26*  --   --   --  1.08  --     Estimated Creatinine Clearance: 106.9 mL/min (by C-G formula based on SCr of 1.08 mg/dL).  Assessment: 50 yo M on Heparin while Xarelto on hold. Last dose of Xarelto; 10/25. Heparin level was slightly < goal on 1250 units/hr, rate was increased this morning.  Patient is now s/p cath and plan is to restart heparin 2 hours post TR band removal. Will restart heparin at increased dose.  CBC remains stable and no signs of bleeding noted.  Goal of Therapy:  Heparin level 0.3-0.7 units/ml Monitor platelets by anticoagulation protocol: Yes   Plan:  - Restartheparin at 1350 units/hr, 2 hours post TR band removal - Daily heparin level, CBC - Monitor for signs and symptoms of bleeding - Follow-up restart of Xarelto  Thank you for allowing [place] to participate in this patients care. Jens Som, PharmD Pager: [phone] 02/03/2016 1:04 PM

## 2016-02-03 NOTE — Progress Notes (Signed)
ANTICOAGULATION CONSULT NOTE - Follow Up Consult  Pharmacy Consult for Heparin (Xarelto on hold) Indication: atrial fibrillation and Hx DVT  Allergies  Allergen Reactions  . Pollen Extract     Runny nose Watery eyes Nasal congestion    Patient Measurements: Height: 6\' 1"  (185.4 cm) Weight: 256 lb 11.2 oz (116.4 kg) IBW/kg (Calculated) : 79.9  Vital Signs: Temp: 98.5 F (36.9 C) (10/30 0856) Temp Source: Oral (10/30 0856) BP: 108/64 (10/30 0856) Pulse Rate: 92 (10/30 0856)  Labs:  Recent Labs  02/01/16 0456 02/01/16 0915 02/01/16 1403 02/01/16 2132 02/02/16 0437 02/03/16 0344 02/03/16 0345  HGB 14.8  --   --   --  14.8 15.4  --   HCT 43.0  --   --   --  43.5 46.0  --   PLT 231  --   --   --  230 222  --   APTT 144*  --  70* 66*  --   --   --   LABPROT  --   --   --   --   --   --  13.8  INR  --   --   --   --   --   --  1.06  HEPARINUNFRC 1.20*  --  0.53 0.35 0.30  --  0.24*  CREATININE  --  1.26*  --   --   --  1.08  --     Estimated Creatinine Clearance: 106.9 mL/min (by C-G formula based on SCr of 1.08 mg/dL).  Assessment: 50 yo M on Heparin while Xarelto on hold, with plans for cath today. Last dose of Xarelto; 10/25. Heparin level is slightly < goal on 1250 units/hr.  Will increase dose.  CBC remains stable and no signs of bleeding noted.  Goal of Therapy:  Heparin level 0.3-0.7 units/ml Monitor platelets by anticoagulation protocol: Yes   Plan:  - Increaseheparin to 1350 units/hr - Cath at noon therefore will not recheck heparin level  - Follow-up after cath - Daily heparin level, CBC - Monitor for signs and symptoms of bleeding - Follow-up restart of Xarelto post-cath  [employer], Pharm.D., BCPS Clinical Pharmacist Pager [phone] 02/03/2016 9:44 AM

## 2016-02-03 NOTE — Progress Notes (Signed)
Subjective:  Still with chest soreness; no dyspnea; no palpitations  Objective:  Vital Signs in the last 24 hours: BP 108/64 (BP Location: Left Arm)   Pulse 92   Temp 98.5 F (36.9 C) (Oral)   Resp 18   Ht 6\' 1"  (1.854 m)   Wt 256 lb 11.2 oz (116.4 kg)   SpO2 95%   BMI 33.87 kg/m   Physical Exam: Mildly obese male currently in no acute distress HEENT: normal Neck: supple Lungs:  Clear Cardiac:  Irregular Abd: soft Extremities:  No edema present Neuro: grossly intact  Intake/Output from previous day: 10/29 0701 - 10/30 0700 In: 1724.5 [P.O.:240; I.V.:1484.5] Out: 2850 [Urine:2850]  Weight Filed Weights   01/30/16 1539 01/31/16 0627 02/03/16 0356  Weight: 260 lb 3.2 oz (118 kg) 258 lb 11.2 oz (117.3 kg) 256 lb 11.2 oz (116.4 kg)    Lab Results: Basic Metabolic Panel:  Recent Labs  02/01/16 0915 02/03/16 0344  NA 141 136  K 4.1 3.8  CL 104 104  CO2 28 24  GLUCOSE 150* 139*  BUN 18 16  CREATININE 1.26* 1.08   CBC:  Recent Labs  02/02/16 0437 02/03/16 0344  WBC 10.4 11.9*  HGB 14.8 15.4  HCT 43.5 46.0  MCV 92.4 93.7  PLT 230 222    Telemetry: Sinus rhythm converting to atrial fibrillation at 2 AM  Assessment/Plan:  1.  Continued chest tightness with moderate risk of cardiac etiology.  Catheterization is planned today.  He is currently on IV heparin following cessation of Xarelto. 2.  Recent cardioversion for atrial fibrillation now with recurrent atrial fibrillation following DCCV. 3.  Hypertension controlled 4.  Mild renal insufficiency  Recommendations:  Continue heparin; for cath today.  Cardiac catheterization was discussed with the patient fully including risks of myocardial infarction, death, stroke.  The patient understands and is willing to proceed.  Possibility of intervention at the same time also discussed with patient and he understands and is agreeable to proceed. Pt with recurrent atrial fibrillation; previously failed tikosyn,  amiodarone, flecanide and has had 2 previous ablations; will need EP FU. Continue toprol; will need to resume anticoagulation following cath particularly given recent DCCV.      Kirk Ruths  MD [facility]  02/03/2016, 9:25 AM

## 2016-02-03 NOTE — Progress Notes (Signed)
Pt noted to be back in AFIB at approx 0340. HR stable in 80's to low 100's. I notified CCMD pt had a rhythm change and was now back in AFIB. Strip saved showing pt converted at 0208. Pt is on IV heparin, awaiting Cath in am. Asymptomatic. MD on call text paged to inform. Jessie Foot, RN

## 2016-02-04 ENCOUNTER — Encounter (HOSPITAL_COMMUNITY): Payer: Self-pay | Admitting: Physician Assistant

## 2016-02-04 LAB — CBC
HEMATOCRIT: 47.3 % (ref 39.0–52.0)
HEMOGLOBIN: 16.4 g/dL (ref 13.0–17.0)
MCH: 32.1 pg (ref 26.0–34.0)
MCHC: 34.7 g/dL (ref 30.0–36.0)
MCV: 92.6 fL (ref 78.0–100.0)
Platelets: 255 10*3/uL (ref 150–400)
RBC: 5.11 MIL/uL (ref 4.22–5.81)
RDW: 12.7 % (ref 11.5–15.5)
WBC: 16.8 10*3/uL — AB (ref 4.0–10.5)

## 2016-02-04 LAB — BASIC METABOLIC PANEL
ANION GAP: 8 (ref 5–15)
BUN: 15 mg/dL (ref 6–20)
CHLORIDE: 106 mmol/L (ref 101–111)
CO2: 24 mmol/L (ref 22–32)
Calcium: 9.4 mg/dL (ref 8.9–10.3)
Creatinine, Ser: 1.2 mg/dL (ref 0.61–1.24)
GFR calc non Af Amer: >60 mL/min (ref >60)
Glucose, Bld: 126 mg/dL — ABNORMAL HIGH (ref 65–99)
POTASSIUM: 4.4 mmol/L (ref 3.5–5.1)
Sodium: 138 mmol/L (ref 135–145)

## 2016-02-04 LAB — HEPARIN LEVEL (UNFRACTIONATED): Heparin Unfractionated: 0.34 IU/mL (ref 0.30–0.70)

## 2016-02-04 MED ORDER — NITROGLYCERIN 0.4 MG SL SUBL: 0.4000 mg | SUBLINGUAL_TABLET | SUBLINGUAL | 12 refills | Status: DC | PRN

## 2016-02-04 MED ORDER — ENALAPRIL MALEATE 10 MG PO TABS: 10.0000 mg | ORAL_TABLET | Freq: Every day | ORAL | 3 refills | Status: DC

## 2016-02-04 MED ORDER — ASPIRIN 81 MG PO CHEW: 81.0000 mg | CHEWABLE_TABLET | Freq: Every day | ORAL | 0 refills | Status: DC

## 2016-02-04 MED ORDER — CLOPIDOGREL BISULFATE 75 MG PO TABS: 75.0000 mg | ORAL_TABLET | Freq: Every day | ORAL | 11 refills | Status: DC

## 2016-02-04 MED ORDER — PANTOPRAZOLE SODIUM 40 MG PO TBEC: 40.0000 mg | DELAYED_RELEASE_TABLET | Freq: Every day | ORAL | 11 refills | Status: DC

## 2016-02-04 MED ORDER — RIVAROXABAN 20 MG PO TABS
20.0000 mg | ORAL_TABLET | Freq: Every day | ORAL | Status: DC
  Administered 2016-02-04: 20 mg via ORAL
  Filled 2016-02-04: qty 1

## 2016-02-04 MED FILL — Verapamil HCl IV Soln 2.5 MG/ML: INTRAVENOUS | Qty: 2 | Status: AC

## 2016-02-04 NOTE — Progress Notes (Signed)
CARDIAC REHAB PHASE I   PRE:  Rate/Rhythm: 106 a fib  BP:  Sitting: 127/87        SaO2: 98 RA  MODE:  Ambulation: 500 ft   POST:  Rate/Rhythm: 117 a fib  BP:  Sitting: 144/87         SaO2: 96 RA  Pt ambulated 500 ft on RA, independent, steady gait, tolerated well.  Pt c/o mild DOE, denies cp, dizziness, declined rest stop. Completed PCI/stent education.  Reviewed risk factors, anti-platelet therapy, PCI book, stent card, activity restrictions, ntg, exercise, heart healthy diet, and phase 2 cardiac rehab. Pt verbalized understanding, receptive. Pt agrees to phase 2 cardiac rehab referral, will send to [facility] per pt request. Pt to recliner after walk, call bell within reach.    [place], RN, BSN 02/04/2016 8:51 AM

## 2016-02-04 NOTE — Discharge Instructions (Signed)

## 2016-02-04 NOTE — Progress Notes (Signed)
ANTICOAGULATION CONSULT NOTE - Follow Up Consult  Pharmacy Consult for Heparin (Xarelto on hold) Indication: atrial fibrillation and Hx DVT, CAD now s/p cath  Allergies  Allergen Reactions  . Pollen Extract     Runny nose Watery eyes Nasal congestion    Patient Measurements: Height: 6\' 1"  (185.4 cm) Weight: 255 lb 11.7 oz (116 kg) IBW/kg (Calculated) : 79.9  Vital Signs: Temp: 98.5 F (36.9 C) (10/31 0344) Temp Source: Oral (10/31 0344) BP: 131/74 (10/31 0344) Pulse Rate: 82 (10/31 0344)  Labs:  Recent Labs  02/01/16 0456 02/01/16 0915 02/01/16 1403 02/01/16 2132 02/02/16 0437 02/03/16 0344 02/03/16 0345 02/04/16 0333  HGB 14.8  --   --   --  14.8 15.4  --  16.4  HCT 43.0  --   --   --  43.5 46.0  --  47.3  PLT 231  --   --   --  230 222  --  255  APTT 144*  --  70* 66*  --   --   --   --   LABPROT  --   --   --   --   --   --  13.8  --   INR  --   --   --   --   --   --  1.06  --   HEPARINUNFRC 1.20*  --  0.53 0.35 0.30  --  0.24* 0.34  CREATININE  --  1.26*  --   --   --  1.08  --  1.20    Estimated Creatinine Clearance: 96 mL/min (by C-G formula based on SCr of 1.2 mg/dL).  Assessment: Heparin while Xarelto on hold, heparin level is therapeutic x 1 after re-start s/p cath  Goal of Therapy:  Heparin level 0.3-0.7 units/mL Monitor platelets by anticoagulation protocol: Yes   Plan:  -Cont heparin 1350 units/hr -1200 HL  Lilianah Buffin, [place] 02/04/2016,4:21 AM

## 2016-02-04 NOTE — Progress Notes (Signed)
Subjective:  Denies CP; no dyspnea; no palpitations  Objective:  Vital Signs in the last 24 hours: BP 127/87 (BP Location: Left Arm)   Pulse (!) 109   Temp 97.5 F (36.4 C) (Oral)   Resp (!) 21   Ht 6\' 1"  (1.854 m)   Wt 255 lb 11.7 oz (116 kg)   SpO2 97%   BMI 33.74 kg/m   Physical Exam: Mildly obese male currently in no acute distress HEENT: normal Neck: supple Lungs:  Clear Cardiac:  Irregular Abd: soft Extremities:  No edema present; radial cath site with no hematoma Neuro: grossly intact  Intake/Output from previous day: 10/30 0701 - 10/31 0700 In: 2936.1 [P.O.:2300; I.V.:636.1] Out: 2775 [Urine:2775]  Weight Filed Weights   01/31/16 0627 02/03/16 0356 02/04/16 0344  Weight: 258 lb 11.2 oz (117.3 kg) 256 lb 11.2 oz (116.4 kg) 255 lb 11.7 oz (116 kg)    Lab Results: Basic Metabolic Panel:  Recent Labs  02/03/16 0344 02/04/16 0333  NA 136 138  K 3.8 4.4  CL 104 106  CO2 24 24  GLUCOSE 139* 126*  BUN 16 15  CREATININE 1.08 1.20   CBC:  Recent Labs  02/03/16 0344 02/04/16 0333  WBC 11.9* 16.8*  HGB 15.4 16.4  HCT 46.0 47.3  MCV 93.7 92.6  PLT 222 255    Telemetry: Afib with mildly elevated HR  Assessment/Plan:  1.  CAD-s/p PCI of LAD and diagonal 2.  Recent cardioversion for atrial fibrillation now with recurrent atrial fibrillation. 3.  Hypertension  4.  Mild renal insufficiency  Recommendations:  Doing well s/p PCI of LAD and diagonal; continue ASA and plavix; DC ASA in one month and continue plavix long term. Pt with recurrent atrial fibrillation; previously failed tikosyn, amiodarone, flecanide and has had 2 previous ablations; will need EP FU (scheduled to see Dr Rayann Heman tomorrow); may need surgical MAZE. Continue toprol; DC heparin and resume xarelto 20 mg daily.  DC today and fu with Dr Rayann Heman and Dr Radford Pax. >30 min PA and physician time D2  Kirk Ruths  MD [facility]  02/04/2016, 7:39 AM

## 2016-02-04 NOTE — Discharge Summary (Signed)
Discharge Summary    Patient ID: Austin Woods,  MRN: [id number], DOB/AGE: 52-Apr-1967 52 y.o.  Admit date: 01/30/2016 Discharge date: 02/04/2016  Primary Care Provider: Mickie Woods Primary Cardiologist: Dr Austin Woods, Dr Austin Woods  Discharge Diagnoses    Principal Problem:   Persistent atrial fibrillation [facility]) Active Problems:   Unstable angina pectoris ([place])   Chest pain   Allergies Allergies  Allergen Reactions  . Pollen Extract     Runny nose Watery eyes Nasal congestion    Diagnostic Studies/Procedures    Procedure: Electrical Cardioversion 01/30/2016 Indications:  Atrial Fibrillation Time Out: Verified patient identification, verified procedure,medications/allergies/relevent history reviewed, required imaging and test results available.  Performed Procedure Details The patient was NPO after midnight. Anesthesia was administered at the beside  by Dr.Fitzgerald with 100mg  of propofol and 60mg  IV Lidocaine.  Cardioversion was done with synchronized biphasic defibrillation with AP pads with 150watts.  The patient did not convert to normal sinus rhythm. Cardioversion was done again with synchronized biphasic defibrillation with AP pads with 200watts.  The patient converted to normal sinus rhythm.  The patient tolerated the procedure well  IMPRESSION: Successful cardioversion of atrial fibrillation  ECHO: 10/27 - Left ventricle: The cavity size was mildly dilated. Systolic   function was mildly reduced. The estimated ejection fraction was   in the range of 45% to 50%. Moderate hypokinesis of the apical   myocardium. Left ventricular diastolic function parameters were   normal for the patient&'s age.  CATH: 10/30  Prox LAD 50-60%, FFR 0.72>>PCI w/ PROMUS PREM Austin 3.0X16 DES>>0%  Ost 1st Diag lesion, 50 %stenosed.  Post intervention w/ PTCA, there is a 30% residual stenosis in D1.  Conclusions: 1. Mild to moderate LMCA and moderate to severe proximal/mid  LAD and D1 disease. FFR of proximal/mid LAD stenosis was significant at 0.72 with greatest pressure drop occurring across this lesion. 2. Normal left ventricular filling pressure. 3. Successful FFR guided PCI to proximal/mid LAD with 0% residual stenosis and TIMI-3 flow. Prevention to mid LAD resulted in total occlusion of the ostium of D1. TIMI-3 flow was successfully restored with 30% residual stenosis using balloon angioplasty with kissing balloon inflation. Recommendations: 1. Dual antiplatelet therapy with aspirin and clopidogrel for at least a month, in addition to rivaroxaban or other anticoagulation. At that point, could consider discontinuation of aspirin if no ischemic symptoms or present. 2. Aggressive secondary prevention. 3. Restart heparin infusion for atrial fibrillation 2 hours after TR band has been deflated. Post-Intervention Diagram     Radiology results below. 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 Anticoag w/ Xarelto, CHA2DS2VASc=4 (HTN, DVT x 2, and now CAD). HTN, HLD, GERD, OSA on CPAP. Seen in afib clinic 10/25 and came in for DCCV 10/26.   Hospital Course     Consultants: None   Austin Woods had the DCCV and tolerated it well, maintaining SR on an increased dose of metoprolol. After the procedure, he had 8/10 chest pain and was admitted.   His enzymes were negative for MI, but he had been having pain off/on for a week. He was scheduled for a GXT MV, but got chest pain after only a few minutes on the treadmill. His pain resolved with SL NTG. His Xarelto was discontinued and he was started on heparin. An echo was performed, results above. His EF is decreased and this will  be followed.   On 10/30, he went back into atrial fibrillation, but his HR was generally < 100. He will follow up with EP for this, continue rate control medications.   He  was taken to the cath lab on 10/30, results above. He had a DES to the LAD and PTCA to the adjoining diagonal. He will be on triple therapy for a month, with ASA, Plavix and Xarelto. D/c ASA after one month, continue Plavix and Xarelto. Because of the Plavix, his Prilosec was changed to Protonix.  On 10/31, he was seen by Dr Austin Woods and all data were reviewed. He is on a statin, lipid profile is below. He is on a BB and his heart rate is generally well-controlled. He will be taken off Mobic because of the addition of ASA & Plaivx. He had nitroglycerin added to his medication regimen prn.   He ambulated with cardiac rehab and was educated on heart healthy lifestyle issues, stent restrictions and exercise guidelines. He is encouraged to attend cardiac rehab as an outpatient. No further inpatient workup was indicated and he is considered stable for discharge, to follow up as an outpatient.  _____________  Discharge Vitals Blood pressure 120/71, pulse 94, temperature 97.8 F (36.6 C), temperature source Oral, resp. rate 13, height 6\' 1"  (1.854 m), weight 255 lb 11.7 oz (116 kg), SpO2 94 %.  Filed Weights   01/31/16 0627 02/03/16 0356 02/04/16 0344  Weight: 258 lb 11.2 oz (117.3 kg) 256 lb 11.2 oz (116.4 kg) 255 lb 11.7 oz (116 kg)    Labs & Radiologic Studies    CBC  Recent Labs  02/03/16 0344 02/04/16 0333  WBC 11.9* 16.8*  HGB 15.4 16.4  HCT 46.0 47.3  MCV 93.7 92.6  PLT 222 [id number]   Basic Metabolic Panel  Recent Labs  02/03/16 0344 02/04/16 0333  NA 136 138  K 3.8 4.4  CL 104 106  CO2 24 24  GLUCOSE 139* 126*  BUN 16 15  CREATININE 1.08 1.20  CALCIUM 9.1 9.4   Cardiac Enzymes Lab Results  Component Value Date   CKTOTAL 69 01/30/2016   TROPONINI <0.03 01/31/2016   Fasting Lipid Panel Lab Results  Component Value Date   CHOL 145 01/31/2016   HDL 31 (L) 01/31/2016   LDLCALC 58 01/31/2016   TRIG 280 (H) 01/31/2016   CHOLHDL 4.7 01/31/2016   _____________  Nm  Myocar Multi W/spect W/wall Motion / Ef  Result Date: 01/31/2016 CLINICAL DATA:  Chest pain post cardioversion. History of atrial fibrillation, hypertension and asthma EXAM: MYOCARDIAL IMAGING WITH SPECT (REST) TECHNIQUE: Standard myocardial SPECT imaging was performed after resting intravenous injection of 10 millicurie [id number] tetrofosmin. Quantitative gated imaging was not performed. COMPARISON:  Chest radiographs 02/01/2014. FINDINGS: Perfusion: Rest only imaging demonstrates mildly decreased active at the apex and distal lateral wall. The left ventricular activity otherwise appears normal. Wall Motion: Gated imaging not performed. Left Ventricular Ejection Fraction: NA % End diastolic volume NA ml End systolic volume NA ml IMPRESSION: 1. Limited rest only study, demonstrating mildly decreased activity at the apex and distal lateral wall. 2. No gated imaging obtained. 3. Non invasive risk stratification*: Cannot be determined by this limited examination. *2012 Appropriate Use Criteria for Coronary Revascularization Focused Update: J Am Coll Cardiol. N6492421. http://content.airportbarriers.com.aspx?articleid=[email or web address] Electronically Signed   By: Richardean Sale M.D.   On: 01/31/2016 13:34   Disposition   Pt is being discharged home today in good condition.  Follow-up Plans & Appointments  Follow-up Information    Thompson Grayer, MD Follow up on 02/05/2016.   Specialty:  Cardiology Why:  See MD at 11:30 am.  Contact information: [address] [place] [address] [phone]          Discharge Instructions    Amb Referral to Cardiac Rehabilitation    Complete by:  As directed    Diagnosis:   PTCA Coronary Stents     Diet - low sodium heart healthy    Complete by:  As directed    Increase activity slowly    Complete by:  As directed       Discharge Medications   Current Discharge Medication List    START taking these medications   Details  aspirin  81 MG chewable tablet Chew 1 tablet (81 mg total) by mouth daily. Qty: 30 tablet, Refills: 0    clopidogrel (PLAVIX) 75 MG tablet Take 1 tablet (75 mg total) by mouth daily with breakfast. Qty: 33 tablet, Refills: 11    nitroGLYCERIN (NITROSTAT) 0.4 MG SL tablet Place 1 tablet (0.4 mg total) under the tongue every 5 (five) minutes x 3 doses as needed for chest pain. Qty: 25 tablet, Refills: 12    pantoprazole (PROTONIX) 40 MG tablet Take 1 tablet (40 mg total) by mouth daily. Qty: 30 tablet, Refills: 11      CONTINUE these medications which have NOT CHANGED   Details  albuterol (PROVENTIL HFA) 108 (90 BASE) MCG/ACT inhaler Inhale 2 puffs into the lungs every 4 (four) hours as needed for wheezing or shortness of breath.    atorvastatin (LIPITOR) 10 MG tablet Take 10 mg by mouth daily.    Cholecalciferol (VITAMIN D) 2000 UNITS CAPS Take 2 capsules by mouth daily.     Fluticasone-Salmeterol (ADVAIR DISKUS) 250-50 MCG/DOSE AEPB Inhale 1 puff into the lungs every 12 (twelve) hours.    furosemide (LASIX) 40 MG tablet TAKE 1 TABLET BY MOUTH  DAILY Qty: 90 tablet, Refills: 0    magnesium 30 MG tablet Take 30 mg by mouth 2 (two) times daily.    metoprolol succinate (TOPROL-XL) 25 MG 24 hr tablet Take 1 tablet (25 mg total) by mouth 2 (two) times daily. Qty: 180 tablet, Refills: 2    oxybutynin (DITROPAN) 5 MG tablet Take 2.5 mg by mouth 2 (two) times daily as needed for bladder spasms.     Potassium Chloride ER 20 MEQ TBCR Take 1 tablet by mouth daily. Qty: 90 tablet, Refills: 1    PROCTOZONE-HC 2.5 % rectal cream Apply 3 times daily Qty: 90 g, Refills: 3    rivaroxaban (XARELTO) 20 MG TABS tablet Take 1 tablet (20 mg total) by mouth daily with supper. Qty: 90 tablet, Refills: 2    tadalafil (CIALIS) 5 MG tablet Take 5 mg by mouth daily as needed for erectile dysfunction.    enalapril (VASOTEC) 10 MG tablet TAKE 1 TABLET BY MOUTH  DAILY Qty: 90 tablet, Refills: 0      HYDROcodone-acetaminophen (NORCO/VICODIN) 5-325 MG tablet Take 1 tablet by mouth every 6 (six) hours as needed for moderate pain. Qty: 10 tablet, Refills: 0    ketoconazole (NIZORAL) 2 % cream Apply 1 application topically 2 (two) times daily. Qty: 30 g, Refills: 4    triamcinolone cream (KENALOG) 0.1 % Apply 1 application topically 2 (two) times daily. Prn rash; use up to 2 weeks Qty: 30 g, Refills: 0      STOP taking these medications  meloxicam (MOBIC) 15 MG tablet      omeprazole (PRILOSEC) 20 MG capsule           Outstanding Labs/Studies   None  Duration of Discharge Encounter   Greater than 30 minutes including physician time.  Jonetta Speak NP 02/04/2016, 11:34 AM

## 2016-02-05 ENCOUNTER — Ambulatory Visit (INDEPENDENT_AMBULATORY_CARE_PROVIDER_SITE_OTHER): Payer: BLUE CROSS/BLUE SHIELD | Admitting: Internal Medicine

## 2016-02-05 ENCOUNTER — Encounter: Payer: Self-pay | Admitting: Internal Medicine

## 2016-02-05 VITALS — BP 120/80 | HR 108 | Ht 73.0 in | Wt 258.1 lb

## 2016-02-05 DIAGNOSIS — I10 Essential (primary) hypertension: Secondary | ICD-10-CM | POA: Diagnosis not present

## 2016-02-05 DIAGNOSIS — I481 Persistent atrial fibrillation: Secondary | ICD-10-CM | POA: Diagnosis not present

## 2016-02-05 DIAGNOSIS — G4733 Obstructive sleep apnea (adult) (pediatric): Secondary | ICD-10-CM

## 2016-02-05 DIAGNOSIS — I2584 Coronary atherosclerosis due to calcified coronary lesion: Secondary | ICD-10-CM

## 2016-02-05 DIAGNOSIS — I2510 Atherosclerotic heart disease of native coronary artery without angina pectoris: Secondary | ICD-10-CM

## 2016-02-05 DIAGNOSIS — I4819 Other persistent atrial fibrillation: Secondary | ICD-10-CM

## 2016-02-05 MED ORDER — METOPROLOL SUCCINATE ER 50 MG PO TB24: 50.0000 mg | ORAL_TABLET | Freq: Two times a day (BID) | ORAL | 3 refills | Status: DC

## 2016-02-05 NOTE — Progress Notes (Signed)
PCP:  [name], MD Primary Cardiologist:  Bronson Ing Electrophysiologist: Dr. Rayann Heman  The patient presents today for electrophysiology follow up.  He has recently had recurrence of symptomatic persistent afib.  He was evaluated in the AF clinic and scheduled for cardioversion.  Due to chest pain (which he typically has with afib), he underwent cath which revealed only modest prox/mid LAD disease (FFR 0.72).  Stenting of LAD led to D1 ostial occlusion requiring additional intervention with 30% residual stenosis.  The patient continues to have his chest discomfort which is typical of his afib.  He did not receive relief with PCI. He returns today stating that he does not feel very well.  He is concerned that he cannot return to work at this time.  He did undergo cardioversion by Dr Radford Pax 01/30/16 but quickly returned to afib.   Past Medical History  Diagnosis Date  . Hypertension   . High cholesterol   . Asthma   . GERD (gastroesophageal reflux disease)   . History of DVT (deep vein thrombosis)     a. left leg following ablation for SVT  . Wolff-Parkinson-White (WPW) syndrome     a. s/p RFCA 2001 by Dr Caryl Comes, pt reports complicated by post procedure DVT  . Persistent atrial fibrillation     a. Dx 08/2012, xarelto initiated; b. 09/2012 Tikosyn initiated -> DCCV -> Sinus c. PVI 03/2013 d. PVI 03/2014  . Sleep apnea     does not use CPAP (he reports having uvulectomy)    Past Surgical History  Procedure Laterality Date  . Back surgery    . Tonsillectomy    . Stomach surgery      reflux, fundoplication  . Nose surgery      sleep apnea surgery  . Tee without cardioversion N/A 08/25/2012    Procedure: TRANSESOPHAGEAL ECHOCARDIOGRAM (TEE);  Surgeon: Thayer Headings, MD;  Location: [place];  Service: Cardiovascular;  Laterality: N/A;  . Cardioversion N/A 08/25/2012    Procedure: CARDIOVERSION;  Surgeon: Thayer Headings, MD;  Location: [facility] ENDOSCOPY;  Service: Cardiovascular;   Laterality: N/A;  . Cardioversion N/A 09/06/2012    Procedure: CARDIOVERSION/Bedside;  Surgeon: Carlena Bjornstad, MD;  Location: [place];  Service: Cardiovascular;  Laterality: N/A;  . Tee without cardioversion  03/23/2013    DR ROSS  . Atrial fibrillation ablation  03/23/2013    PVI by DR Rayann Heman for afib  . Tee without cardioversion N/A 03/23/2013    Procedure: TRANSESOPHAGEAL ECHOCARDIOGRAM (TEE);  Surgeon: Fay Records, MD;  Location: [facility] ENDOSCOPY;  Service: Cardiovascular;  Laterality: N/A;  . Cardioversion N/A 01/12/2014    Procedure: CARDIOVERSION;  Surgeon: Pixie Casino, MD;  Location: [facility] ENDOSCOPY;  Service: Cardiovascular;  Laterality: N/A;  . Cardiac surgery    . Atrial fibrillation ablation N/A 03/23/2013    Procedure: ATRIAL FIBRILLATION ABLATION;  Surgeon: Coralyn Mark, MD;  Location: [place] CATH LAB;  Service: Cardiovascular;  Laterality: N/A;  . Tee without cardioversion N/A 03/26/2014    Procedure: TRANSESOPHAGEAL ECHOCARDIOGRAM (TEE);  Surgeon: Josue Hector, MD;  Location: [facility] ENDOSCOPY;  Service: Cardiovascular;  Laterality: N/A;  . Atrial fibrillation ablation N/A 03/27/2014    PVI Dr Rayann Heman    Current Outpatient Prescriptions  Medication Sig Dispense Refill  .  Current Outpatient Prescriptions:  .  albuterol (PROVENTIL HFA) 108 (90 BASE) MCG/ACT inhaler, Inhale 2 puffs into the lungs every 4 (four) hours as needed for wheezing or shortness of breath., Disp: , Rfl:  .  aspirin 81 MG chewable tablet, Chew 1 tablet (81 mg total) by mouth daily., Disp: 30 tablet, Rfl: 0 .  atorvastatin (LIPITOR) 10 MG tablet, Take 10 mg by mouth daily., Disp: , Rfl:  .  Cholecalciferol (VITAMIN D) 2000 UNITS CAPS, Take 2 capsules by mouth daily. , Disp: , Rfl:  .  clopidogrel (PLAVIX) 75 MG tablet, Take 1 tablet (75 mg total) by mouth daily with breakfast., Disp: 33 tablet, Rfl: 11 .  enalapril (VASOTEC) 10 MG tablet, Take 1 tablet (10 mg total) by mouth daily., Disp: 90 tablet, Rfl: 3 .   Fluticasone-Salmeterol (ADVAIR DISKUS) 250-50 MCG/DOSE AEPB, Inhale 1 puff into the lungs every 12 (twelve) hours., Disp: , Rfl:  .  furosemide (LASIX) 40 MG tablet, TAKE 1 TABLET BY MOUTH  DAILY, Disp: 90 tablet, Rfl: 0 .  HYDROcodone-acetaminophen (NORCO/VICODIN) 5-325 MG tablet, Take 1 tablet by mouth every 6 (six) hours as needed for moderate pain., Disp: 10 tablet, Rfl: 0 .  ketoconazole (NIZORAL) 2 % cream, Apply 1 application topically 2 (two) times daily., Disp: 30 g, Rfl: 4 .  magnesium 30 MG tablet, Take 30 mg by mouth 2 (two) times daily., Disp: , Rfl:  .  nitroGLYCERIN (NITROSTAT) 0.4 MG SL tablet, Place 1 tablet (0.4 mg total) under the tongue every 5 (five) minutes x 3 doses as needed for chest pain., Disp: 25 tablet, Rfl: 12 .  oxybutynin (DITROPAN) 5 MG tablet, Take 2.5 mg by mouth 2 (two) times daily as needed for bladder spasms. , Disp: , Rfl:  .  pantoprazole (PROTONIX) 40 MG tablet, Take 1 tablet (40 mg total) by mouth daily., Disp: 30 tablet, Rfl: 11 .  Potassium Chloride ER 20 MEQ TBCR, Take 1 tablet by mouth daily., Disp: 90 tablet, Rfl: 1 .  PROCTOZONE-HC 2.5 % rectal cream, Apply 3 times daily, Disp: 90 g, Rfl: 3 .  rivaroxaban (XARELTO) 20 MG TABS tablet, Take 1 tablet (20 mg total) by mouth daily with supper., Disp: 90 tablet, Rfl: 2 .  tadalafil (CIALIS) 5 MG tablet, Take 5 mg by mouth daily as needed for erectile dysfunction., Disp: , Rfl:  .  triamcinolone cream (KENALOG) 0.1 %, Apply 1 application topically 2 (two) times daily. Prn rash; use up to 2 weeks, Disp: 30 g, Rfl: 0 .  metoprolol succinate (TOPROL-XL) 50 MG 24 hr tablet, Take 1 tablet (50 mg total) by mouth 2 (two) times daily. Take with or immediately following a meal., Disp: 180 tablet, Rfl: 3      .      .      .      .      .      .      .      .      .      .      .      .      .       No current facility-administered medications for this visit.   ROS- all systems are reviewed and negative  except as per HPI above  No Known Allergies  History   Social History  . Marital Status: Married    Spouse Name: N/A  . Number of Children: 2  . Years of Education: N/A   Occupational History  .  [employer]   Social History Main Topics  . Smoking status: Former Smoker    Quit date: 08/06/2010  . Smokeless tobacco: Never  Used  . Alcohol Use: No  . Drug Use: No  . Sexual Activity: Not on file   Other Topics Concern  . Not on file   Social History Narrative   Pt lives in [place] with wife.  Works at [employer] History  Problem Relation Age of Onset  . CAD Father 29  . Diabetes Father   . Heart attack Father   . Transient ischemic attack Father   . Vascular Disease Father   . Diabetes Mother   . Anemia Mother   . Gout Mother   . Diabetes Brother   . Aneurysm Maternal Grandmother   . Stroke Maternal Grandfather   . Diabetes Paternal Grandmother   . Stroke Paternal Grandfather      PE: Vitals:   02/05/16 1135  BP: 120/80  Pulse: (!) 108   GEN- The patient is well appearing, alert and oriented x 3 today.   Head- normocephalic, atraumatic Eyes-  Sclera clear, conjunctiva pink Ears- hearing intact Oropharynx- clear Neck- supple,  Lungs- Clear to ausculation bilaterally, normal work of breathing Heart-irregular rate and rhythm, no murmurs, rubs or gallops, PMI not laterally displaced GI- soft, NT, ND, + BS Extremities- no clubbing, cyanosis, no edema MS- no significant deformity or atrophy Skin- no rash or lesion Psych- euthymic mood, full affect Neuro- strength and sensation are intact   ILR site is well healed Interrogation is reviewed  Assessment and Plan:  1. Persistent Afib S/p PVI x 2 (most recently 12/15 with good result).  He has recently developed recurrent refractory afib.  He previously failed medical therapy with amiodarone due to SOB.  Options including tikosyn, repeat PVI, and MAZE were discussed today. I spoke with Dr Roxy Manns who feels  that he is not currently a candidate for MAZE due to recent stent and need for antiplatelet therapy x 6 months. Our options are therefore repeat PVI vs tikosyn.  The patient will contemplate these options and let me know once he decides which way to proceed.  Will require cardiac CT prior to ablation to better define pulmonary veinous anatomy and exclude pulmonary vein stenosis as well evaluate LAA for thrombus. Increase metoprolol to 50mg  BID.  2. CAD S/p recent PCI of the LAD with subsequent D1 jailed.  This was opened with 30% residual stenosis. Continue ASA x 4 weeks then discontinued. Continue plavix x 6 months  3. OSA Compliance with CPAP encouraged.  4. HTN Stable No change required today  5. Obesity Body mass index is Body mass index is 34.05 kg/m.  Stable No change required today  Follow-up with me in 2 weeks  Very complex medical situation.  Pt with recent hospitalization and ongoing refractory symptoms.  He is at high risk for decompensation/ repeat hospitalization.  A high level of decision making was required for this encounter today.  Thompson Grayer MD, [facility] 02/05/2016 9:27 PM

## 2016-02-05 NOTE — Patient Instructions (Signed)
Medication Instructions:  Your physician has recommended you make the following change in your medication:  1) Increase Metoprolol to 50 mg twice daily   Labwork: None ordered   Testing/Procedures: None ordered   Follow-Up: Your physician recommends that you schedule a follow-up appointment in: 2 weeks with Dr Rayann Heman   Any Other Special Instructions Will Be Listed Below (If Applicable).     If you need a refill on your cardiac medications before your next appointment, please call your pharmacy.

## 2016-02-06 ENCOUNTER — Telehealth: Payer: Self-pay | Admitting: Cardiology

## 2016-02-06 ENCOUNTER — Telehealth: Payer: Self-pay | Admitting: Internal Medicine

## 2016-02-06 ENCOUNTER — Encounter: Payer: Self-pay | Admitting: Internal Medicine

## 2016-02-06 DIAGNOSIS — I480 Paroxysmal atrial fibrillation: Secondary | ICD-10-CM

## 2016-02-06 NOTE — Telephone Encounter (Signed)
Spoke with patient and gave him Dr Jackalyn Lombard recommendations.  He wants to give the ablation a second try.  I have sent in order for CT and waiting for date and time.  He is scheduled for the ablation on 02/18/16 second case.  I let him know I would discuss with Dr Rayann Heman tomorrow about him returning to work and when that should be as his note says 02/10/16.  He was appreciative of my call

## 2016-02-06 NOTE — Telephone Encounter (Signed)
Spoke w/ pt and requested that he send a manual transmission b/c his home monitor has not updated in at least 14 days.   

## 2016-02-06 NOTE — Telephone Encounter (Signed)
Message  Received: Yesterday  Message Contents  Thompson Grayer, MD  Dionicio Stall, RN        Please call patient and let him know that I spoke with Dr Roxy Manns. He is not a candidate for MAZE given recent surgery and plavix.  I would therefore advise that we proceed with repeat afib ablation. If he is willing, offer him the spot that we had left in November. Make sure that he does not miss any xarelto.  Will need cardiac CT prior to ablation.  If he declines ablation, we can admit next week for tikosyn.

## 2016-02-06 NOTE — Telephone Encounter (Signed)
New message      Talk to the nurse.  Pt has a note to be out of work until the 6th.  He states this date is wrong.  Please call

## 2016-02-10 ENCOUNTER — Telehealth: Payer: Self-pay | Admitting: Internal Medicine

## 2016-02-10 ENCOUNTER — Ambulatory Visit (HOSPITAL_COMMUNITY): Payer: BLUE CROSS/BLUE SHIELD

## 2016-02-10 ENCOUNTER — Encounter: Payer: Self-pay | Admitting: *Deleted

## 2016-02-10 NOTE — Telephone Encounter (Signed)
New Message  Lamy from [facility] call requesting to speak with RN about getting a verbal diagnoses for pt and out of work date. please call back to discuss

## 2016-02-10 NOTE — Telephone Encounter (Signed)
Returned call to patient and advised his ablation is scheduled for 02/18/16.  He is aware to be at the hospital at [place] after midnight and no medications the morning of the procedure.  He is still asking for an excuse form work.  Says, " he can not get a full breath".  I let him know I would discuss with Dr Rayann Heman and if approved will send him a note via fax today.  He will call back with fax number.

## 2016-02-10 NOTE — Telephone Encounter (Signed)
Left message for [facility].  His return to work date is 03/02/16  Dx Afib

## 2016-02-10 NOTE — Telephone Encounter (Signed)
New message  Pt call requesting to speak with RN about getting an appt schedule for the ablation. Please call back to discuss

## 2016-02-10 NOTE — Telephone Encounter (Signed)
F/u Message  Pt call to give fax number for wife's job [phone]

## 2016-02-10 NOTE — Telephone Encounter (Signed)
Letter done and faxed

## 2016-02-11 ENCOUNTER — Telehealth: Payer: Self-pay | Admitting: Internal Medicine

## 2016-02-11 NOTE — Telephone Encounter (Signed)
I called [facility] back in regards to protonix rx.  Pharmacist states pt is on omeprazole and protonix, which is correct. I advised her omeprazole was dc'd 02/04/16 at hospital.   Protonix was started by Rosaria Ferries, PA-C. She voiced understanding and will fill the rx.

## 2016-02-11 NOTE — Telephone Encounter (Signed)
New message       Question regarding pts protonix 40mg  and another medication (did not understand which one).  Please refer to reference [id number]

## 2016-02-14 ENCOUNTER — Ambulatory Visit (HOSPITAL_COMMUNITY)
Admission: RE | Admit: 2016-02-14 | Discharge: 2016-02-14 | Disposition: A | Discharge status: Home / Self Care | Payer: BLUE CROSS/BLUE SHIELD | Source: Ambulatory Visit | Attending: Internal Medicine | Admitting: Internal Medicine

## 2016-02-14 ENCOUNTER — Encounter (HOSPITAL_COMMUNITY): Payer: Self-pay

## 2016-02-14 DIAGNOSIS — I700 Atherosclerosis of aorta: Secondary | ICD-10-CM | POA: Diagnosis not present

## 2016-02-14 DIAGNOSIS — I480 Paroxysmal atrial fibrillation: Secondary | ICD-10-CM | POA: Diagnosis not present

## 2016-02-14 DIAGNOSIS — I4891 Unspecified atrial fibrillation: Secondary | ICD-10-CM | POA: Diagnosis not present

## 2016-02-14 MED ORDER — METOPROLOL TARTRATE 5 MG/5ML IV SOLN
5.0000 mg | Freq: Once | INTRAVENOUS | Status: AC
  Administered 2016-02-14: 5 mg via INTRAVENOUS

## 2016-02-14 MED ORDER — METOPROLOL TARTRATE 5 MG/5ML IV SOLN
INTRAVENOUS | Status: AC
  Filled 2016-02-14: qty 15

## 2016-02-14 MED ORDER — METOPROLOL TARTRATE 5 MG/5ML IV SOLN
INTRAVENOUS | Status: AC
  Filled 2016-02-14: qty 5

## 2016-02-14 MED ORDER — NITROGLYCERIN 0.4 MG SL SUBL
0.4000 mg | SUBLINGUAL_TABLET | SUBLINGUAL | Status: DC | PRN
  Administered 2016-02-14: 0.4 mg via SUBLINGUAL

## 2016-02-14 MED ORDER — METOPROLOL TARTRATE 5 MG/5ML IV SOLN
5.0000 mg | Freq: Three times a day (TID) | INTRAVENOUS | Status: DC | PRN
  Administered 2016-02-14 (×3): 5 mg via INTRAVENOUS

## 2016-02-14 MED ORDER — IOPAMIDOL (ISOVUE-370) INJECTION 76%
INTRAVENOUS | Status: AC
  Administered 2016-02-14: 80 mL
  Filled 2016-02-14: qty 100

## 2016-02-14 MED ORDER — NITROGLYCERIN 0.4 MG SL SUBL
SUBLINGUAL_TABLET | SUBLINGUAL | Status: AC
  Filled 2016-02-14: qty 1

## 2016-02-17 ENCOUNTER — Telehealth: Payer: Self-pay | Admitting: Internal Medicine

## 2016-02-17 NOTE — Telephone Encounter (Signed)
New message  Pt needs medical records release to [employer], short term insurance  [employer] has made several requests for the medical records  Please follow up

## 2016-02-17 NOTE — Telephone Encounter (Signed)
Spoke with patient and let him know to be at [facility] at [place] after midnight  No medications the morning of the procedure Plan for one night stay

## 2016-02-17 NOTE — Telephone Encounter (Signed)
FMLA has been sent to our copy service [place]

## 2016-02-17 NOTE — Telephone Encounter (Signed)
Pt is calling in reference to when to arrive for surgery tomorrow  Please call back

## 2016-02-18 ENCOUNTER — Ambulatory Visit (HOSPITAL_COMMUNITY)
Admission: RE | Admit: 2016-02-18 | Discharge: 2016-02-19 | Disposition: A | Discharge status: Home / Self Care | Payer: BLUE CROSS/BLUE SHIELD | Source: Ambulatory Visit | Attending: Internal Medicine | Admitting: Internal Medicine

## 2016-02-18 ENCOUNTER — Ambulatory Visit (HOSPITAL_COMMUNITY): Payer: BLUE CROSS/BLUE SHIELD | Admitting: Certified Registered Nurse Anesthetist

## 2016-02-18 ENCOUNTER — Encounter (HOSPITAL_COMMUNITY)
Admission: RE | Disposition: A | Discharge status: Home / Self Care | Payer: Self-pay | Source: Ambulatory Visit | Attending: Internal Medicine

## 2016-02-18 ENCOUNTER — Encounter (HOSPITAL_COMMUNITY): Payer: Self-pay | Admitting: Certified Registered Nurse Anesthetist

## 2016-02-18 DIAGNOSIS — Z8249 Family history of ischemic heart disease and other diseases of the circulatory system: Secondary | ICD-10-CM | POA: Diagnosis not present

## 2016-02-18 DIAGNOSIS — I481 Persistent atrial fibrillation: Secondary | ICD-10-CM | POA: Diagnosis not present

## 2016-02-18 DIAGNOSIS — I4891 Unspecified atrial fibrillation: Secondary | ICD-10-CM | POA: Diagnosis present

## 2016-02-18 DIAGNOSIS — I10 Essential (primary) hypertension: Secondary | ICD-10-CM | POA: Diagnosis not present

## 2016-02-18 DIAGNOSIS — Z87891 Personal history of nicotine dependence: Secondary | ICD-10-CM | POA: Diagnosis not present

## 2016-02-18 DIAGNOSIS — K219 Gastro-esophageal reflux disease without esophagitis: Secondary | ICD-10-CM | POA: Diagnosis not present

## 2016-02-18 DIAGNOSIS — Z6835 Body mass index (BMI) 35.0-35.9, adult: Secondary | ICD-10-CM | POA: Insufficient documentation

## 2016-02-18 DIAGNOSIS — Z955 Presence of coronary angioplasty implant and graft: Secondary | ICD-10-CM | POA: Insufficient documentation

## 2016-02-18 DIAGNOSIS — E669 Obesity, unspecified: Secondary | ICD-10-CM | POA: Diagnosis not present

## 2016-02-18 DIAGNOSIS — Z86718 Personal history of other venous thrombosis and embolism: Secondary | ICD-10-CM | POA: Insufficient documentation

## 2016-02-18 DIAGNOSIS — I110 Hypertensive heart disease with heart failure: Secondary | ICD-10-CM | POA: Diagnosis not present

## 2016-02-18 DIAGNOSIS — I2510 Atherosclerotic heart disease of native coronary artery without angina pectoris: Secondary | ICD-10-CM | POA: Diagnosis not present

## 2016-02-18 DIAGNOSIS — Z7982 Long term (current) use of aspirin: Secondary | ICD-10-CM | POA: Diagnosis not present

## 2016-02-18 DIAGNOSIS — E7800 Pure hypercholesterolemia, unspecified: Secondary | ICD-10-CM | POA: Insufficient documentation

## 2016-02-18 DIAGNOSIS — Z7901 Long term (current) use of anticoagulants: Secondary | ICD-10-CM | POA: Diagnosis not present

## 2016-02-18 DIAGNOSIS — G4733 Obstructive sleep apnea (adult) (pediatric): Secondary | ICD-10-CM | POA: Insufficient documentation

## 2016-02-18 DIAGNOSIS — J45909 Unspecified asthma, uncomplicated: Secondary | ICD-10-CM | POA: Diagnosis not present

## 2016-02-18 DIAGNOSIS — Z79899 Other long term (current) drug therapy: Secondary | ICD-10-CM | POA: Diagnosis not present

## 2016-02-18 DIAGNOSIS — Z7902 Long term (current) use of antithrombotics/antiplatelets: Secondary | ICD-10-CM | POA: Diagnosis not present

## 2016-02-18 DIAGNOSIS — I5033 Acute on chronic diastolic (congestive) heart failure: Secondary | ICD-10-CM | POA: Diagnosis not present

## 2016-02-18 DIAGNOSIS — I209 Angina pectoris, unspecified: Secondary | ICD-10-CM | POA: Diagnosis not present

## 2016-02-18 HISTORY — PX: ELECTROPHYSIOLOGIC STUDY: SHX172A

## 2016-02-18 LAB — POCT ACTIVATED CLOTTING TIME
ACTIVATED CLOTTING TIME: 324 s
ACTIVATED CLOTTING TIME: 169 s
Activated Clotting Time: 290 seconds
Activated Clotting Time: 340 seconds

## 2016-02-18 LAB — MRSA PCR SCREENING: MRSA by PCR: NEGATIVE

## 2016-02-18 SURGERY — ATRIAL FIBRILLATION ABLATION
Anesthesia: General

## 2016-02-18 MED ORDER — PANTOPRAZOLE SODIUM 40 MG PO TBEC
40.0000 mg | DELAYED_RELEASE_TABLET | Freq: Every day | ORAL | Status: DC
  Administered 2016-02-19: 40 mg via ORAL
  Filled 2016-02-18: qty 1

## 2016-02-18 MED ORDER — BUPIVACAINE HCL (PF) 0.25 % IJ SOLN
INTRAMUSCULAR | Status: DC | PRN
  Administered 2016-02-18: 20 mL

## 2016-02-18 MED ORDER — FENTANYL CITRATE (PF) 100 MCG/2ML IJ SOLN
12.5000 ug | Freq: Once | INTRAMUSCULAR | Status: AC
  Administered 2016-02-18: 12.5 ug via INTRAVENOUS

## 2016-02-18 MED ORDER — OXYBUTYNIN CHLORIDE 5 MG PO TABS: 2.5000 mg | ORAL_TABLET | Freq: Two times a day (BID) | ORAL | Status: DC | PRN

## 2016-02-18 MED ORDER — ACETAMINOPHEN 325 MG PO TABS
650.0000 mg | ORAL_TABLET | ORAL | Status: DC | PRN
  Administered 2016-02-19 (×2): 650 mg via ORAL
  Filled 2016-02-18 (×2): qty 2

## 2016-02-18 MED ORDER — PROMETHAZINE HCL 25 MG/ML IJ SOLN: 6.2500 mg | INTRAMUSCULAR | Status: DC | PRN

## 2016-02-18 MED ORDER — RIVAROXABAN 20 MG PO TABS
20.0000 mg | ORAL_TABLET | Freq: Every day | ORAL | Status: DC
  Administered 2016-02-18: 20 mg via ORAL
  Filled 2016-02-18: qty 1

## 2016-02-18 MED ORDER — HEPARIN SODIUM (PORCINE) 1000 UNIT/ML IJ SOLN
INTRAMUSCULAR | Status: DC | PRN
  Administered 2016-02-18: 1000 [IU] via INTRAVENOUS
  Administered 2016-02-18: 12000 [IU] via INTRAVENOUS

## 2016-02-18 MED ORDER — HYDROCODONE-ACETAMINOPHEN 5-325 MG PO TABS: 1.0000 | ORAL_TABLET | Freq: Four times a day (QID) | ORAL | Status: DC | PRN

## 2016-02-18 MED ORDER — METOPROLOL SUCCINATE ER 50 MG PO TB24
50.0000 mg | ORAL_TABLET | Freq: Two times a day (BID) | ORAL | Status: DC
  Administered 2016-02-18 – 2016-02-19 (×2): 50 mg via ORAL
  Filled 2016-02-18 (×2): qty 1

## 2016-02-18 MED ORDER — MOMETASONE FURO-FORMOTEROL FUM 200-5 MCG/ACT IN AERO
2.0000 | INHALATION_SPRAY | Freq: Two times a day (BID) | RESPIRATORY_TRACT | Status: DC
  Administered 2016-02-18 – 2016-02-19 (×2): 2 via RESPIRATORY_TRACT
  Filled 2016-02-18: qty 8.8

## 2016-02-18 MED ORDER — ONDANSETRON HCL 4 MG/2ML IJ SOLN: 4.0000 mg | Freq: Four times a day (QID) | INTRAMUSCULAR | Status: DC | PRN

## 2016-02-18 MED ORDER — FENTANYL CITRATE (PF) 100 MCG/2ML IJ SOLN
INTRAMUSCULAR | Status: AC
  Filled 2016-02-18: qty 2

## 2016-02-18 MED ORDER — ONDANSETRON HCL 4 MG/2ML IJ SOLN
INTRAMUSCULAR | Status: DC | PRN
  Administered 2016-02-18: 4 mg via INTRAVENOUS

## 2016-02-18 MED ORDER — FENTANYL CITRATE (PF) 100 MCG/2ML IJ SOLN
INTRAMUSCULAR | Status: DC | PRN
  Administered 2016-02-18: 50 ug via INTRAVENOUS
  Administered 2016-02-18: 25 ug via INTRAVENOUS
  Administered 2016-02-18: 50 ug via INTRAVENOUS
  Administered 2016-02-18: 25 ug via INTRAVENOUS

## 2016-02-18 MED ORDER — LIDOCAINE HCL (CARDIAC) 20 MG/ML IV SOLN
INTRAVENOUS | Status: DC | PRN
  Administered 2016-02-18: 100 mg via INTRAVENOUS

## 2016-02-18 MED ORDER — SODIUM CHLORIDE 0.9% FLUSH
3.0000 mL | Freq: Two times a day (BID) | INTRAVENOUS | Status: DC
  Administered 2016-02-18: 3 mL via INTRAVENOUS

## 2016-02-18 MED ORDER — ASPIRIN 81 MG PO CHEW
81.0000 mg | CHEWABLE_TABLET | Freq: Every day | ORAL | Status: DC
  Filled 2016-02-18: qty 1

## 2016-02-18 MED ORDER — LACTATED RINGERS IV SOLN
INTRAVENOUS | Status: DC | PRN
  Administered 2016-02-18: 11:00:00 via INTRAVENOUS

## 2016-02-18 MED ORDER — MIDAZOLAM HCL 5 MG/5ML IJ SOLN
INTRAMUSCULAR | Status: DC | PRN
  Administered 2016-02-18: 2 mg via INTRAVENOUS

## 2016-02-18 MED ORDER — HYDROCODONE-ACETAMINOPHEN 5-325 MG PO TABS: 1.0000 | ORAL_TABLET | ORAL | Status: DC | PRN

## 2016-02-18 MED ORDER — SODIUM CHLORIDE 0.9 % IV SOLN
INTRAVENOUS | Status: DC
  Administered 2016-02-18: 11:00:00 via INTRAVENOUS

## 2016-02-18 MED ORDER — ENALAPRIL MALEATE 20 MG PO TABS
10.0000 mg | ORAL_TABLET | Freq: Every day | ORAL | Status: DC
  Administered 2016-02-19: 10 mg via ORAL
  Filled 2016-02-18: qty 0.5

## 2016-02-18 MED ORDER — ALBUTEROL SULFATE (2.5 MG/3ML) 0.083% IN NEBU: 2.2500 mg | INHALATION_SOLUTION | RESPIRATORY_TRACT | Status: DC | PRN

## 2016-02-18 MED ORDER — FENTANYL CITRATE (PF) 100 MCG/2ML IJ SOLN
25.0000 ug | Freq: Once | INTRAMUSCULAR | Status: AC
  Administered 2016-02-18: 25 ug via INTRAVENOUS

## 2016-02-18 MED ORDER — BUPIVACAINE HCL (PF) 0.25 % IJ SOLN
INTRAMUSCULAR | Status: AC
  Filled 2016-02-18: qty 30

## 2016-02-18 MED ORDER — FENTANYL CITRATE (PF) 100 MCG/2ML IJ SOLN: 25.0000 ug | INTRAMUSCULAR | Status: DC | PRN

## 2016-02-18 MED ORDER — IOPAMIDOL (ISOVUE-370) INJECTION 76%
INTRAVENOUS | Status: AC
  Filled 2016-02-18: qty 50

## 2016-02-18 MED ORDER — PROTAMINE SULFATE 10 MG/ML IV SOLN
INTRAVENOUS | Status: DC | PRN
  Administered 2016-02-18: 20 mg via INTRAVENOUS
  Administered 2016-02-18: 10 mg via INTRAVENOUS

## 2016-02-18 MED ORDER — SODIUM CHLORIDE 0.9 % IV SOLN: 250.0000 mL | INTRAVENOUS | Status: DC | PRN

## 2016-02-18 MED ORDER — PROPOFOL 10 MG/ML IV BOLUS
INTRAVENOUS | Status: DC | PRN
  Administered 2016-02-18: 200 mg via INTRAVENOUS
  Administered 2016-02-18: 40 mg via INTRAVENOUS

## 2016-02-18 MED ORDER — PNEUMOCOCCAL VAC POLYVALENT 25 MCG/0.5ML IJ INJ: 0.5000 mL | INJECTION | INTRAMUSCULAR | Status: DC

## 2016-02-18 MED ORDER — CLOPIDOGREL BISULFATE 75 MG PO TABS
75.0000 mg | ORAL_TABLET | Freq: Every day | ORAL | Status: DC
  Administered 2016-02-19: 75 mg via ORAL
  Filled 2016-02-18: qty 1

## 2016-02-18 MED ORDER — HEPARIN SODIUM (PORCINE) 1000 UNIT/ML IJ SOLN
INTRAMUSCULAR | Status: DC | PRN
  Administered 2016-02-18: 5 mL via INTRAVENOUS
  Administered 2016-02-18: 2 mL via INTRAVENOUS
  Administered 2016-02-18: 1 mL via INTRAVENOUS

## 2016-02-18 MED ORDER — POTASSIUM CHLORIDE CRYS ER 10 MEQ PO TBCR
20.0000 meq | EXTENDED_RELEASE_TABLET | Freq: Every day | ORAL | Status: DC
  Administered 2016-02-19: 20 meq via ORAL
  Filled 2016-02-18: qty 2

## 2016-02-18 MED ORDER — SODIUM CHLORIDE 0.9% FLUSH: 3.0000 mL | INTRAVENOUS | Status: DC | PRN

## 2016-02-18 SURGICAL SUPPLY — 18 items
BAG SNAP BAND KOVER 36X36 (MISCELLANEOUS) ×2 IMPLANT
BLANKET WARM UNDERBOD FULL ACC (MISCELLANEOUS) ×2 IMPLANT
CATH NAVISTAR SMARTTOUCH DF (ABLATOR) ×2 IMPLANT
CATH SOUNDSTAR 3D IMAGING (CATHETERS) ×2 IMPLANT
CATH VARIABLE LASSO NAV 2515 (CATHETERS) ×2 IMPLANT
CATH WEBSTER BI DIR CS D-F CRV (CATHETERS) ×2 IMPLANT
NEEDLE TRANSEP BRK 71CM 407200 (NEEDLE) ×2 IMPLANT
PACK EP LATEX FREE (CUSTOM PROCEDURE TRAY) ×1
PACK EP LF (CUSTOM PROCEDURE TRAY) ×1 IMPLANT
PAD DEFIB LIFELINK (PAD) ×2 IMPLANT
PATCH CARTO3 (PAD) ×2 IMPLANT
SHEATH AVANTI 11F 11CM (SHEATH) ×2 IMPLANT
SHEATH PINNACLE 7F 10CM (SHEATH) ×4 IMPLANT
SHEATH PINNACLE 9F 10CM (SHEATH) ×2 IMPLANT
SHEATH SWARTZ TS SL2 63CM 8.5F (SHEATH) ×2 IMPLANT
SHIELD RADPAD SCOOP 12X17 (MISCELLANEOUS) ×2 IMPLANT
TUBING SMART ABLATE COOLFLOW (TUBING) ×2 IMPLANT
WIRE GUIDERIGHT .032X180 (WIRE) ×2 IMPLANT

## 2016-02-18 NOTE — Anesthesia Preprocedure Evaluation (Signed)
Anesthesia Evaluation  Patient identified by MRN, date of birth, ID band Patient awake    Reviewed: Allergy & Precautions, NPO status , Patient's Chart, lab work & pertinent test results  Airway Mallampati: II  TM Distance: >3 FB Neck ROM: Full    Dental   Pulmonary asthma , sleep apnea , former smoker,  No longer using CPAP S/P Ucvulectomy   Pulmonary exam normal breath sounds clear to auscultation       Cardiovascular Exercise Tolerance: Poor hypertension, Pt. on medications and Pt. on home beta blockers + angina +CHF  Normal cardiovascular exam+ dysrhythmias Atrial Fibrillation  Rhythm:Regular Rate:Normal  A. Fib with RVR   Neuro/Psych  Headaches, negative psych ROS   GI/Hepatic Neg liver ROS, GERD  Medicated and Controlled,  Endo/Other  Obesity Hyperlipidemia  Renal/GU negative Renal ROS  negative genitourinary   Musculoskeletal negative musculoskeletal ROS (+)   Abdominal   Peds  Hematology  (+) Blood dyscrasia, ,   Anesthesia Other Findings   Reproductive/Obstetrics                             Lab Results  Component Value Date   WBC 16.8 (H) 02/04/2016   HGB 16.4 02/04/2016   HCT 47.3 02/04/2016   MCV 92.6 02/04/2016   PLT 255 02/04/2016     Chemistry      Component Value Date/Time   NA 138 02/04/2016 0333   NA 145 (H) 08/19/2015 1015   K 4.4 02/04/2016 0333   CL 106 02/04/2016 0333   CO2 24 02/04/2016 0333   BUN 15 02/04/2016 0333   BUN 13 08/19/2015 1015   CREATININE 1.20 02/04/2016 0333   CREATININE 1.00 02/25/2015 1306      Component Value Date/Time   CALCIUM 9.4 02/04/2016 0333   ALKPHOS 54 08/19/2015 1015   AST 18 08/19/2015 1015   ALT 21 08/19/2015 1015   BILITOT 1.2 08/19/2015 1015     EKG: atrial fibrillation, rate 108.  Anesthesia Physical Anesthesia Plan  ASA: III  Anesthesia Plan: General   Post-op Pain Management:    Induction:  Intravenous  Airway Management Planned: LMA  Additional Equipment:   Intra-op Plan:   Post-operative Plan: Extubation in OR  Informed Consent: I have reviewed the patients History and Physical, chart, labs and discussed the procedure including the risks, benefits and alternatives for the proposed anesthesia with the patient or authorized representative who has indicated his/her understanding and acceptance.   Dental advisory given  Plan Discussed with: Anesthesiologist, CRNA and Surgeon  Anesthesia Plan Comments:         Anesthesia Quick Evaluation

## 2016-02-18 NOTE — Anesthesia Postprocedure Evaluation (Signed)
Anesthesia Post Note  Patient: Austin Woods  Procedure(s) Performed: Procedure(s) (LRB): Atrial Fibrillation Ablation (N/A)  Patient location during evaluation: PACU Anesthesia Type: General Level of consciousness: awake and alert Pain management: pain level controlled Vital Signs Assessment: post-procedure vital signs reviewed and stable Respiratory status: spontaneous breathing, nonlabored ventilation and respiratory function stable Cardiovascular status: blood pressure returned to baseline and stable Postop Assessment: no signs of nausea or vomiting Anesthetic complications: no    Last Vitals:  Vitals:   02/18/16 0927 02/18/16 1449  BP: (!) 117/97 122/71  Pulse: (!) 55 100  Resp: 18 19  Temp: 36.9 C 36.4 C    Last Pain:  Vitals:   02/18/16 1449  TempSrc: Temporal  PainSc: 8                  Acsa Estey A.

## 2016-02-18 NOTE — Progress Notes (Signed)
Hematoma rt groin prior to sheath pull. Dr. Rayann Heman made aware. Rt groin fv sheaths pulled at 1512. Firm  hematoma rt scrotum and under puncture site. Manual pressure held for 1 hr 30 minutes. Hematoma semi firm now after hold. Bruising developing. Medicated for pain. Instructions given. Bedrest starts at 1645. Rt DP 2+.

## 2016-02-18 NOTE — H&P (View-Only) (Signed)
PCP:  [name], MD Primary Cardiologist:  Bronson Ing Electrophysiologist: Dr. Rayann Heman  The patient presents today for electrophysiology follow up.  He has recently had recurrence of symptomatic persistent afib.  He was evaluated in the AF clinic and scheduled for cardioversion.  Due to chest pain (which he typically has with afib), he underwent cath which revealed only modest prox/mid LAD disease (FFR 0.72).  Stenting of LAD led to D1 ostial occlusion requiring additional intervention with 30% residual stenosis.  The patient continues to have his chest discomfort which is typical of his afib.  He did not receive relief with PCI. He returns today stating that he does not feel very well.  He is concerned that he cannot return to work at this time.  He did undergo cardioversion by Dr Radford Pax 01/30/16 but quickly returned to afib.   Past Medical History  Diagnosis Date  . Hypertension   . High cholesterol   . Asthma   . GERD (gastroesophageal reflux disease)   . History of DVT (deep vein thrombosis)     a. left leg following ablation for SVT  . Wolff-Parkinson-White (WPW) syndrome     a. s/p RFCA 2001 by Dr Caryl Comes, pt reports complicated by post procedure DVT  . Persistent atrial fibrillation     a. Dx 08/2012, xarelto initiated; b. 09/2012 Tikosyn initiated -> DCCV -> Sinus c. PVI 03/2013 d. PVI 03/2014  . Sleep apnea     does not use CPAP (he reports having uvulectomy)    Past Surgical History  Procedure Laterality Date  . Back surgery    . Tonsillectomy    . Stomach surgery      reflux, fundoplication  . Nose surgery      sleep apnea surgery  . Tee without cardioversion N/A 08/25/2012    Procedure: TRANSESOPHAGEAL ECHOCARDIOGRAM (TEE);  Surgeon: Thayer Headings, MD;  Location: [place];  Service: Cardiovascular;  Laterality: N/A;  . Cardioversion N/A 08/25/2012    Procedure: CARDIOVERSION;  Surgeon: Thayer Headings, MD;  Location: [facility] ENDOSCOPY;  Service: Cardiovascular;   Laterality: N/A;  . Cardioversion N/A 09/06/2012    Procedure: CARDIOVERSION/Bedside;  Surgeon: Carlena Bjornstad, MD;  Location: [place];  Service: Cardiovascular;  Laterality: N/A;  . Tee without cardioversion  03/23/2013    DR ROSS  . Atrial fibrillation ablation  03/23/2013    PVI by DR Rayann Heman for afib  . Tee without cardioversion N/A 03/23/2013    Procedure: TRANSESOPHAGEAL ECHOCARDIOGRAM (TEE);  Surgeon: Fay Records, MD;  Location: [facility] ENDOSCOPY;  Service: Cardiovascular;  Laterality: N/A;  . Cardioversion N/A 01/12/2014    Procedure: CARDIOVERSION;  Surgeon: Pixie Casino, MD;  Location: [facility] ENDOSCOPY;  Service: Cardiovascular;  Laterality: N/A;  . Cardiac surgery    . Atrial fibrillation ablation N/A 03/23/2013    Procedure: ATRIAL FIBRILLATION ABLATION;  Surgeon: Coralyn Mark, MD;  Location: [facility] CATH LAB;  Service: Cardiovascular;  Laterality: N/A;  . Tee without cardioversion N/A 03/26/2014    Procedure: TRANSESOPHAGEAL ECHOCARDIOGRAM (TEE);  Surgeon: Josue Hector, MD;  Location: [facility] ENDOSCOPY;  Service: Cardiovascular;  Laterality: N/A;  . Atrial fibrillation ablation N/A 03/27/2014    PVI Dr Rayann Heman    Current Outpatient Prescriptions  Medication Sig Dispense Refill  .  Current Outpatient Prescriptions:  .  albuterol (PROVENTIL HFA) 108 (90 BASE) MCG/ACT inhaler, Inhale 2 puffs into the lungs every 4 (four) hours as needed for wheezing or shortness of breath., Disp: , Rfl:  .  aspirin 81 MG chewable tablet, Chew 1 tablet (81 mg total) by mouth daily., Disp: 30 tablet, Rfl: 0 .  atorvastatin (LIPITOR) 10 MG tablet, Take 10 mg by mouth daily., Disp: , Rfl:  .  Cholecalciferol (VITAMIN D) 2000 UNITS CAPS, Take 2 capsules by mouth daily. , Disp: , Rfl:  .  clopidogrel (PLAVIX) 75 MG tablet, Take 1 tablet (75 mg total) by mouth daily with breakfast., Disp: 33 tablet, Rfl: 11 .  enalapril (VASOTEC) 10 MG tablet, Take 1 tablet (10 mg total) by mouth daily., Disp: 90 tablet, Rfl: 3 .   Fluticasone-Salmeterol (ADVAIR DISKUS) 250-50 MCG/DOSE AEPB, Inhale 1 puff into the lungs every 12 (twelve) hours., Disp: , Rfl:  .  furosemide (LASIX) 40 MG tablet, TAKE 1 TABLET BY MOUTH  DAILY, Disp: 90 tablet, Rfl: 0 .  HYDROcodone-acetaminophen (NORCO/VICODIN) 5-325 MG tablet, Take 1 tablet by mouth every 6 (six) hours as needed for moderate pain., Disp: 10 tablet, Rfl: 0 .  ketoconazole (NIZORAL) 2 % cream, Apply 1 application topically 2 (two) times daily., Disp: 30 g, Rfl: 4 .  magnesium 30 MG tablet, Take 30 mg by mouth 2 (two) times daily., Disp: , Rfl:  .  nitroGLYCERIN (NITROSTAT) 0.4 MG SL tablet, Place 1 tablet (0.4 mg total) under the tongue every 5 (five) minutes x 3 doses as needed for chest pain., Disp: 25 tablet, Rfl: 12 .  oxybutynin (DITROPAN) 5 MG tablet, Take 2.5 mg by mouth 2 (two) times daily as needed for bladder spasms. , Disp: , Rfl:  .  pantoprazole (PROTONIX) 40 MG tablet, Take 1 tablet (40 mg total) by mouth daily., Disp: 30 tablet, Rfl: 11 .  Potassium Chloride ER 20 MEQ TBCR, Take 1 tablet by mouth daily., Disp: 90 tablet, Rfl: 1 .  PROCTOZONE-HC 2.5 % rectal cream, Apply 3 times daily, Disp: 90 g, Rfl: 3 .  rivaroxaban (XARELTO) 20 MG TABS tablet, Take 1 tablet (20 mg total) by mouth daily with supper., Disp: 90 tablet, Rfl: 2 .  tadalafil (CIALIS) 5 MG tablet, Take 5 mg by mouth daily as needed for erectile dysfunction., Disp: , Rfl:  .  triamcinolone cream (KENALOG) 0.1 %, Apply 1 application topically 2 (two) times daily. Prn rash; use up to 2 weeks, Disp: 30 g, Rfl: 0 .  metoprolol succinate (TOPROL-XL) 50 MG 24 hr tablet, Take 1 tablet (50 mg total) by mouth 2 (two) times daily. Take with or immediately following a meal., Disp: 180 tablet, Rfl: 3      .      .      .      .      .      .      .      .      .      .      .      .      .       No current facility-administered medications for this visit.   ROS- all systems are reviewed and negative  except as per HPI above  No Known Allergies  History   Social History  . Marital Status: Married    Spouse Name: N/A  . Number of Children: 2  . Years of Education: N/A   Occupational History  .  [employer]   Social History Main Topics  . Smoking status: Former Smoker    Quit date: 08/06/2010  . Smokeless tobacco: Never  Used  . Alcohol Use: No  . Drug Use: No  . Sexual Activity: Not on file   Other Topics Concern  . Not on file   Social History Narrative   Pt lives in [place] with wife.  Works at [employer] History  Problem Relation Age of Onset  . CAD Father 60  . Diabetes Father   . Heart attack Father   . Transient ischemic attack Father   . Vascular Disease Father   . Diabetes Mother   . Anemia Mother   . Gout Mother   . Diabetes Brother   . Aneurysm Maternal Grandmother   . Stroke Maternal Grandfather   . Diabetes Paternal Grandmother   . Stroke Paternal Grandfather      PE: Vitals:   02/05/16 1135  BP: 120/80  Pulse: (!) 108   GEN- The patient is well appearing, alert and oriented x 3 today.   Head- normocephalic, atraumatic Eyes-  Sclera clear, conjunctiva pink Ears- hearing intact Oropharynx- clear Neck- supple,  Lungs- Clear to ausculation bilaterally, normal work of breathing Heart-irregular rate and rhythm, no murmurs, rubs or gallops, PMI not laterally displaced GI- soft, NT, ND, + BS Extremities- no clubbing, cyanosis, no edema MS- no significant deformity or atrophy Skin- no rash or lesion Psych- euthymic mood, full affect Neuro- strength and sensation are intact   ILR site is well healed Interrogation is reviewed  Assessment and Plan:  1. Persistent Afib S/p PVI x 2 (most recently 12/15 with good result).  He has recently developed recurrent refractory afib.  He previously failed medical therapy with amiodarone due to SOB.  Options including tikosyn, repeat PVI, and MAZE were discussed today. I spoke with Dr Roxy Manns who feels  that he is not currently a candidate for MAZE due to recent stent and need for antiplatelet therapy x 6 months. Our options are therefore repeat PVI vs tikosyn.  The patient will contemplate these options and let me know once he decides which way to proceed.  Will require cardiac CT prior to ablation to better define pulmonary veinous anatomy and exclude pulmonary vein stenosis as well evaluate LAA for thrombus. Increase metoprolol to 50mg  BID.  2. CAD S/p recent PCI of the LAD with subsequent D1 jailed.  This was opened with 30% residual stenosis. Continue ASA x 4 weeks then discontinued. Continue plavix x 6 months  3. OSA Compliance with CPAP encouraged.  4. HTN Stable No change required today  5. Obesity Body mass index is Body mass index is 34.05 kg/m.  Stable No change required today  Follow-up with me in 2 weeks  Very complex medical situation.  Pt with recent hospitalization and ongoing refractory symptoms.  He is at high risk for decompensation/ repeat hospitalization.  A high level of decision making was required for this encounter today.  Thompson Grayer MD, [facility] 02/05/2016 9:27 PM

## 2016-02-18 NOTE — Progress Notes (Signed)
Patient developed hematoma in right scrotal area; Level 3.  Patient is very tender at the touch.  Pressure applied and sheaths pulled.  ACT at time of sheath removal 169.  Dr. Rayann Heman made aware.  Received an order for 12.4mcg fentanyl.  May repeat once if patient still in pain.  Will continue to monitor patient.

## 2016-02-18 NOTE — Interval H&P Note (Signed)
History and Physical Interval Note:  02/18/2016 10:37 AM  Austin Woods  has presented today for surgery, with the diagnosis of afib  The various methods of treatment have been discussed with the patient and family. After consideration of risks, benefits and other options for treatment, the patient has consented to  Procedure(s): Atrial Fibrillation Ablation (N/A) as a surgical intervention .  The patient's history has been reviewed, patient examined, no change in status, stable for surgery.  I have reviewed the patient's chart and labs.  Questions were answered to the patient's satisfaction.    Cardiac CT reviewed with patient.  He reports compliance with Xarelto without interuption.  Thompson Grayer

## 2016-02-18 NOTE — Anesthesia Procedure Notes (Signed)
Procedure Name: LMA Insertion Date/Time: 02/18/2016 11:23 AM Performed by: Gala Lewandowsky B Pre-anesthesia Checklist: Patient identified, Emergency Drugs available, Suction available and Patient being monitored Patient Re-evaluated:Patient Re-evaluated prior to inductionOxygen Delivery Method: Circle System Utilized Preoxygenation: Pre-oxygenation with 100% oxygen Intubation Type: IV induction Ventilation: Mask ventilation without difficulty LMA: LMA inserted LMA Size: 5.0 Number of attempts: 1 Airway Equipment and Method: Bite block Placement Confirmation: positive ETCO2 Tube secured with: Tape Dental Injury: Teeth and Oropharynx as per pre-operative assessment

## 2016-02-19 ENCOUNTER — Ambulatory Visit: Payer: BLUE CROSS/BLUE SHIELD | Admitting: Family Medicine

## 2016-02-19 ENCOUNTER — Ambulatory Visit (INDEPENDENT_AMBULATORY_CARE_PROVIDER_SITE_OTHER): Payer: BLUE CROSS/BLUE SHIELD | Admitting: *Deleted

## 2016-02-19 ENCOUNTER — Encounter (HOSPITAL_COMMUNITY): Payer: Self-pay | Admitting: Internal Medicine

## 2016-02-19 DIAGNOSIS — G4733 Obstructive sleep apnea (adult) (pediatric): Secondary | ICD-10-CM | POA: Diagnosis not present

## 2016-02-19 DIAGNOSIS — Z87891 Personal history of nicotine dependence: Secondary | ICD-10-CM | POA: Diagnosis not present

## 2016-02-19 DIAGNOSIS — Z79899 Other long term (current) drug therapy: Secondary | ICD-10-CM | POA: Diagnosis not present

## 2016-02-19 DIAGNOSIS — K219 Gastro-esophageal reflux disease without esophagitis: Secondary | ICD-10-CM | POA: Diagnosis not present

## 2016-02-19 DIAGNOSIS — J45909 Unspecified asthma, uncomplicated: Secondary | ICD-10-CM | POA: Diagnosis not present

## 2016-02-19 DIAGNOSIS — E7800 Pure hypercholesterolemia, unspecified: Secondary | ICD-10-CM | POA: Diagnosis not present

## 2016-02-19 DIAGNOSIS — Z86718 Personal history of other venous thrombosis and embolism: Secondary | ICD-10-CM | POA: Diagnosis not present

## 2016-02-19 DIAGNOSIS — Z7902 Long term (current) use of antithrombotics/antiplatelets: Secondary | ICD-10-CM | POA: Diagnosis not present

## 2016-02-19 DIAGNOSIS — Z8249 Family history of ischemic heart disease and other diseases of the circulatory system: Secondary | ICD-10-CM | POA: Diagnosis not present

## 2016-02-19 DIAGNOSIS — E669 Obesity, unspecified: Secondary | ICD-10-CM | POA: Diagnosis not present

## 2016-02-19 DIAGNOSIS — I2510 Atherosclerotic heart disease of native coronary artery without angina pectoris: Secondary | ICD-10-CM | POA: Diagnosis not present

## 2016-02-19 DIAGNOSIS — Z6835 Body mass index (BMI) 35.0-35.9, adult: Secondary | ICD-10-CM | POA: Diagnosis not present

## 2016-02-19 DIAGNOSIS — I481 Persistent atrial fibrillation: Secondary | ICD-10-CM

## 2016-02-19 DIAGNOSIS — Z7901 Long term (current) use of anticoagulants: Secondary | ICD-10-CM | POA: Diagnosis not present

## 2016-02-19 DIAGNOSIS — Z7982 Long term (current) use of aspirin: Secondary | ICD-10-CM | POA: Diagnosis not present

## 2016-02-19 DIAGNOSIS — I480 Paroxysmal atrial fibrillation: Secondary | ICD-10-CM | POA: Diagnosis not present

## 2016-02-19 DIAGNOSIS — Z955 Presence of coronary angioplasty implant and graft: Secondary | ICD-10-CM | POA: Diagnosis not present

## 2016-02-19 NOTE — Progress Notes (Signed)
Right groin remains soft.  Right pedal pulse 2+ .  Pt ambulated unit 600 ft without difficulty.  ASA held this am per Dr. Rayann Heman.  Will continue to monitor closely.

## 2016-02-19 NOTE — Progress Notes (Signed)
Carelink Summary Report / Loop Recorder 

## 2016-02-19 NOTE — Discharge Summary (Signed)
ELECTROPHYSIOLOGY PROCEDURE DISCHARGE SUMMARY    Patient ID: Austin Woods,  MRN: [id number], DOB/AGE: 1965/11/24 50 y.o.  Admit date: 02/18/2016 Discharge date: 02/19/2016  Primary Care Physician: Mickie Hillier, MD Primary Cardiologist: Bronson Ing Electrophysiologist: Thompson Grayer, MD  Primary Discharge Diagnosis:  Persistent atrial fibrillation status post ablation this admission  Secondary Discharge Diagnosis:  1.  CAD s/p PCI to LAD 2.  OSA 3.  Hypertension 4.  Hyperlipidemia  Procedures This Admission:  1.  Electrophysiology study and radiofrequency catheter ablation on 02/18/16 by Dr Thompson Grayer.  This study demonstrated atrial fibrillation upon presentation; intracardiac echo reveals  a moderately enlarged left atrium with a short common segment of the LIPV and LSPV without evidence of pulmonary vein stenosis; return of conduction within the right inferior pulmonary vein.  This was felt to be the culprit for his afib.  The RSPV, LIPV, and LSPV were quiescent from a prior ablation; successful electrical re-isolation and anatomical encircling the RIPV with RF.  Given that prior ablation was rather antral, I elected to perform a very wide area circumferential ablation of all four PVs today; additional ablation performed along the posterior wall of the left atrium in order to create a "standard box" lesion; atrial fibrillation successfully cardioverted to sinus rhythm; no early apparent complications.  Brief HPI: Austin Woods is a 50 y.o. male with a history of persistent atrial fibrillation.  They have failed medical therapy with Multaq, tikosyn, and amiodarone. Risks, benefits, and alternatives to catheter ablation of atrial fibrillation were reviewed with the patient who wished to proceed.  The patient underwent cardiac CT prior to the procedure which demonstrated no LAA thrombus.    Hospital Course:  The patient was admitted and underwent EPS/RFCA of atrial fibrillation  with details as outlined above.  They were monitored on telemetry overnight which demonstrated sinus rhythm with PAC's.  Groin had soft hematoma on the day of discharge.  The patient was examined and considered to be stable for discharge.  Wound care and restrictions were reviewed with the patient.  The patient will be seen back by Roderic Palau, NP in 4 weeks and Dr Rayann Heman in 12 weeks for post ablation follow up.   Discussed with Dr Rayann Heman, with hematoma, will stop ASA and continue Plavix and Xarelto.   This patients CHA2DS2-VASc Score and unadjusted Ischemic Stroke Rate (% per year) is equal to 0.6 % stroke rate/year from a score of 1 Above score calculated as 1 point each if present [CHF, HTN, DM, Vascular=MI/PAD/Aortic Plaque, Age if 65-74, or Male] Above score calculated as 2 points each if present [Age > 75, or Stroke/TIA/TE]   Physical Exam: Vitals:   02/18/16 2353 02/19/16 0352 02/19/16 0812 02/19/16 0836  BP: 112/68 119/77 121/75 121/75  Pulse:  89 92 90  Resp: 17 (!) 21 17   Temp: 98.5 F (36.9 C) 98.2 F (36.8 C) 98.7 F (37.1 C)   TempSrc: Oral Oral Oral   SpO2: 97% 96% 97%   Weight:      Height:        GEN- The patient is well appearing, alert and oriented x 3 today.   HEENT: normocephalic, atraumatic; sclera clear, conjunctiva pink; hearing intact; oropharynx clear; neck supple  Lungs- Clear to ausculation bilaterally, normal work of breathing.  No wheezes, rales, rhonchi Heart- Regular rate and rhythm, no murmurs, rubs or gallops  GI- soft, non-tender, non-distended, bowel sounds present  Extremities- no clubbing, cyanosis, or edema; DP/PT/radial pulses 2+ bilaterally,  groin with soft hematoma, no bruit MS- no significant deformity or atrophy Skin- warm and dry, no rash or lesion Psych- euthymic mood, full affect Neuro- strength and sensation are intact   Labs:   Lab Results  Component Value Date   WBC 16.8 (H) 02/04/2016   HGB 16.4 02/04/2016   HCT 47.3  02/04/2016   MCV 92.6 02/04/2016   PLT 255 02/04/2016   No results for input(s): NA, K, CL, CO2, BUN, CREATININE, CALCIUM, PROT, BILITOT, ALKPHOS, ALT, AST, GLUCOSE in the last 168 hours.  Invalid input(s): LABALBU   Discharge Medications:    Medication List    STOP taking these medications   aspirin 81 MG chewable tablet     TAKE these medications   ADVAIR DISKUS 250-50 MCG/DOSE Aepb Generic drug:  Fluticasone-Salmeterol Inhale 1 puff into the lungs every 12 (twelve) hours.   atorvastatin 10 MG tablet Commonly known as:  LIPITOR Take 10 mg by mouth daily.   clopidogrel 75 MG tablet Commonly known as:  PLAVIX Take 1 tablet (75 mg total) by mouth daily with breakfast.   enalapril 10 MG tablet Commonly known as:  VASOTEC Take 1 tablet (10 mg total) by mouth daily.   furosemide 40 MG tablet Commonly known as:  LASIX TAKE 1 TABLET BY MOUTH  DAILY   HYDROcodone-acetaminophen 5-325 MG tablet Commonly known as:  NORCO/VICODIN Take 1 tablet by mouth every 6 (six) hours as needed for moderate pain.   ketoconazole 2 % cream Commonly known as:  NIZORAL Apply 1 application topically 2 (two) times daily.   magnesium 30 MG tablet Take 30 mg by mouth 2 (two) times daily.   metoprolol succinate 50 MG 24 hr tablet Commonly known as:  TOPROL-XL Take 1 tablet (50 mg total) by mouth 2 (two) times daily. Take with or immediately following a meal.   nitroGLYCERIN 0.4 MG SL tablet Commonly known as:  NITROSTAT Place 1 tablet (0.4 mg total) under the tongue every 5 (five) minutes x 3 doses as needed for chest pain.   oxybutynin 5 MG tablet Commonly known as:  DITROPAN Take 2.5 mg by mouth 2 (two) times daily as needed for bladder spasms.   pantoprazole 40 MG tablet Commonly known as:  PROTONIX Take 1 tablet (40 mg total) by mouth daily.   Potassium Chloride ER 20 MEQ Tbcr Take 1 tablet by mouth daily.   PROCTOZONE-HC 2.5 % rectal cream Generic drug:  hydrocortisone Apply  3 times daily   PROVENTIL HFA 108 (90 Base) MCG/ACT inhaler Generic drug:  albuterol Inhale 2 puffs into the lungs every 4 (four) hours as needed for wheezing or shortness of breath.   rivaroxaban 20 MG Tabs tablet Commonly known as:  XARELTO Take 1 tablet (20 mg total) by mouth daily with supper.   tadalafil 5 MG tablet Commonly known as:  CIALIS Take 5 mg by mouth daily as needed for erectile dysfunction.   triamcinolone cream 0.1 % Commonly known as:  KENALOG Apply 1 application topically 2 (two) times daily. Prn rash; use up to 2 weeks   Vitamin D 2000 units Caps Take 2 capsules by mouth daily.       Disposition:  Discharge Instructions    Diet - low sodium heart healthy    Complete by:  As directed    Increase activity slowly    Complete by:  As directed      Follow-up Information    [facility] Follow up on 03/18/2016.  Specialty:  Cardiology Why:  at 10:30AM Contact information: [id number] [place] [id number] [phone]       Thompson Grayer, MD Follow up on 05/20/2016.   Specialty:  Cardiology Why:  at [facility] information: [place] [address] [phone]           Duration of Discharge Encounter: Greater than 30 minutes including physician time.  Signed, Chanetta Marshall, NP 02/19/2016 8:51 AM   I have seen, examined the patient, and reviewed the above assessment and plan.  On exam, RRR.  R groin with ecchymosis and small hematoma.  No bruit.   Changes to above are made where necessary.   Stop ASA due to hematoma.  Continue plavix given recent stent.  Continue xarelto s/p ablation.  Co Sign: Thompson Grayer, MD 02/19/2016 10:58 PM

## 2016-02-19 NOTE — Transfer of Care (Signed)
Immediate Anesthesia Transfer of Care Note  Patient: Austin Woods  Procedure(s) Performed: Procedure(s): Atrial Fibrillation Ablation (N/A)  Patient Location: PACU  Anesthesia Type:General  Level of Consciousness: awake, alert , oriented and patient cooperative  Airway & Oxygen Therapy: Patient Spontanous Breathing and Patient connected to nasal cannula oxygen  Post-op Assessment: Report given to RN and Post -op Vital signs reviewed and stable  Post vital signs: Reviewed and stable  Last Vitals:  Vitals:   02/19/16 0352 02/19/16 0812  BP: 119/77 121/75  Pulse: 89 92  Resp: (!) 21 17  Temp: 36.8 C 37.1 C    Last Pain:  Vitals:   02/19/16 0812  TempSrc: Oral  PainSc:       Patients Stated Pain Goal: 0 ([id number])  Complications: No apparent anesthesia complications

## 2016-02-19 NOTE — Progress Notes (Signed)
Pt discharged via wheelchair.  No distress noted.

## 2016-02-19 NOTE — Progress Notes (Signed)
Reviewed d/c instructions carefully with pt and daughter.  Verbalizes understanding of medications and care of right groin.  Explained to pt importance of calling 911 for right groin bleeding.  Ambulated 600 ft around unit without diffculty, SOB, light headiness, and or chest pain.

## 2016-02-22 LAB — CUP PACEART REMOTE DEVICE CHECK
Date Time Interrogation Session: 20171016180838
Implantable Pulse Generator Implant Date: 20160325

## 2016-02-22 NOTE — Progress Notes (Signed)
Carelink summary report received. Battery status OK. Normal device function. No new symptom episodes, tachy episodes, brady, or pause episodes. 99% AF, +Xarelto. Monthly summary reports and ROV/PRN

## 2016-02-24 ENCOUNTER — Telehealth: Payer: Self-pay | Admitting: Internal Medicine

## 2016-02-24 ENCOUNTER — Encounter: Payer: Self-pay | Admitting: Internal Medicine

## 2016-02-24 NOTE — Telephone Encounter (Signed)
Spoke with patient and let him know Dr Rayann Heman said to continue putting ice on the area.  He will call back next week if he feels it is not improving.

## 2016-02-24 NOTE — Telephone Encounter (Signed)
New Message:    Pt says he is concerned,he is so swollen he can hardly walk.Pt had surgery on 02-18-16.Please call to advise.

## 2016-02-25 ENCOUNTER — Other Ambulatory Visit: Payer: Self-pay | Admitting: Internal Medicine

## 2016-02-25 ENCOUNTER — Ambulatory Visit: Payer: BLUE CROSS/BLUE SHIELD | Admitting: Family Medicine

## 2016-02-25 MED ORDER — ENALAPRIL MALEATE 10 MG PO TABS: 10.0000 mg | ORAL_TABLET | Freq: Every day | ORAL | 3 refills | Status: DC

## 2016-02-25 MED ORDER — NITROGLYCERIN 0.4 MG SL SUBL: 0.4000 mg | SUBLINGUAL_TABLET | SUBLINGUAL | 1 refills | Status: DC | PRN

## 2016-02-25 MED ORDER — PANTOPRAZOLE SODIUM 40 MG PO TBEC: 40.0000 mg | DELAYED_RELEASE_TABLET | Freq: Every day | ORAL | 3 refills | Status: DC

## 2016-02-25 MED ORDER — METOPROLOL SUCCINATE ER 50 MG PO TB24: 50.0000 mg | ORAL_TABLET | Freq: Two times a day (BID) | ORAL | 3 refills | Status: DC

## 2016-02-25 MED ORDER — RIVAROXABAN 20 MG PO TABS: 20.0000 mg | ORAL_TABLET | Freq: Every day | ORAL | 3 refills | Status: DC

## 2016-02-25 MED ORDER — CLOPIDOGREL BISULFATE 75 MG PO TABS: 75.0000 mg | ORAL_TABLET | Freq: Every day | ORAL | 3 refills | Status: DC

## 2016-02-25 NOTE — Telephone Encounter (Signed)
Pt's pharmacy changed to [place],

## 2016-02-28 ENCOUNTER — Other Ambulatory Visit: Payer: Self-pay | Admitting: *Deleted

## 2016-02-29 MED ORDER — OXYBUTYNIN CHLORIDE 5 MG PO TABS: 2.5000 mg | ORAL_TABLET | Freq: Two times a day (BID) | ORAL | 3 refills | Status: DC | PRN

## 2016-03-02 ENCOUNTER — Telehealth: Payer: Self-pay | Admitting: Internal Medicine

## 2016-03-02 ENCOUNTER — Telehealth: Payer: Self-pay | Admitting: Family Medicine

## 2016-03-02 ENCOUNTER — Other Ambulatory Visit: Payer: Self-pay

## 2016-03-02 MED ORDER — ATORVASTATIN CALCIUM 10 MG PO TABS: 10.0000 mg | ORAL_TABLET | Freq: Every day | ORAL | 0 refills | Status: DC

## 2016-03-02 MED ORDER — TADALAFIL 5 MG PO TABS: 5.0000 mg | ORAL_TABLET | Freq: Every day | ORAL | 0 refills | Status: DC | PRN

## 2016-03-02 MED ORDER — FLUTICASONE-SALMETEROL 250-50 MCG/DOSE IN AEPB: 1.0000 | INHALATION_SPRAY | Freq: Two times a day (BID) | RESPIRATORY_TRACT | 0 refills | Status: DC

## 2016-03-02 MED ORDER — ALBUTEROL SULFATE HFA 108 (90 BASE) MCG/ACT IN AERS: 2.0000 | INHALATION_SPRAY | RESPIRATORY_TRACT | 0 refills | Status: DC | PRN

## 2016-03-02 MED ORDER — FUROSEMIDE 40 MG PO TABS: 40.0000 mg | ORAL_TABLET | Freq: Every day | ORAL | 0 refills | Status: DC

## 2016-03-02 NOTE — Telephone Encounter (Signed)
One mo ok needs ov late on six mo f u

## 2016-03-02 NOTE — Telephone Encounter (Signed)
Received fax from patient's pharmacy requesting Cialis, Proventil inhaler, Advair, Atorvastatin, and Lasix. These medication are listed in epic under a historical provider. May we refill?

## 2016-03-02 NOTE — Addendum Note (Signed)
Addended by: Ofilia Neas R on: 03/02/2016 03:02 PM   Modules accepted: Orders

## 2016-03-02 NOTE — Telephone Encounter (Signed)
New message  Pt is calling/unable to return to work today  Needs a note for work  Please call and advise

## 2016-03-02 NOTE — Telephone Encounter (Signed)
Medication sent into pharmacy  

## 2016-03-02 NOTE — Telephone Encounter (Signed)
  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  Patient aware and appointment made for tomorrow.  He says he is still so sore he can barely walk

## 2016-03-03 ENCOUNTER — Encounter (HOSPITAL_COMMUNITY): Payer: Self-pay | Admitting: Nurse Practitioner

## 2016-03-03 ENCOUNTER — Observation Stay (HOSPITAL_COMMUNITY)
Admission: AD | Admit: 2016-03-03 | Discharge: 2016-03-04 | DRG: 310 | Disposition: A | Discharge status: Home / Self Care | Payer: BLUE CROSS/BLUE SHIELD | Source: Ambulatory Visit | Attending: Internal Medicine | Admitting: Internal Medicine

## 2016-03-03 ENCOUNTER — Telehealth (HOSPITAL_COMMUNITY): Payer: Self-pay | Admitting: *Deleted

## 2016-03-03 ENCOUNTER — Ambulatory Visit (HOSPITAL_BASED_OUTPATIENT_CLINIC_OR_DEPARTMENT_OTHER)
Admission: RE | Admit: 2016-03-03 | Discharge: 2016-03-03 | Disposition: A | Discharge status: Home / Self Care | Payer: BLUE CROSS/BLUE SHIELD | Source: Ambulatory Visit | Attending: Nurse Practitioner | Admitting: Nurse Practitioner

## 2016-03-03 ENCOUNTER — Ambulatory Visit: Payer: BLUE CROSS/BLUE SHIELD | Admitting: Family Medicine

## 2016-03-03 VITALS — BP 140/88 | HR 139 | Ht 73.0 in | Wt 263.2 lb

## 2016-03-03 DIAGNOSIS — Z9889 Other specified postprocedural states: Secondary | ICD-10-CM

## 2016-03-03 DIAGNOSIS — Z87891 Personal history of nicotine dependence: Secondary | ICD-10-CM | POA: Diagnosis not present

## 2016-03-03 DIAGNOSIS — G4730 Sleep apnea, unspecified: Secondary | ICD-10-CM | POA: Diagnosis not present

## 2016-03-03 DIAGNOSIS — Z86718 Personal history of other venous thrombosis and embolism: Secondary | ICD-10-CM

## 2016-03-03 DIAGNOSIS — M79609 Pain in unspecified limb: Secondary | ICD-10-CM | POA: Diagnosis not present

## 2016-03-03 DIAGNOSIS — K219 Gastro-esophageal reflux disease without esophagitis: Secondary | ICD-10-CM | POA: Diagnosis present

## 2016-03-03 DIAGNOSIS — Z7901 Long term (current) use of anticoagulants: Secondary | ICD-10-CM

## 2016-03-03 DIAGNOSIS — Z8249 Family history of ischemic heart disease and other diseases of the circulatory system: Secondary | ICD-10-CM | POA: Diagnosis not present

## 2016-03-03 DIAGNOSIS — T148XXA Other injury of unspecified body region, initial encounter: Secondary | ICD-10-CM

## 2016-03-03 DIAGNOSIS — E785 Hyperlipidemia, unspecified: Secondary | ICD-10-CM | POA: Diagnosis not present

## 2016-03-03 DIAGNOSIS — Z823 Family history of stroke: Secondary | ICD-10-CM

## 2016-03-03 DIAGNOSIS — I2510 Atherosclerotic heart disease of native coronary artery without angina pectoris: Secondary | ICD-10-CM | POA: Diagnosis present

## 2016-03-03 DIAGNOSIS — J45909 Unspecified asthma, uncomplicated: Secondary | ICD-10-CM | POA: Diagnosis not present

## 2016-03-03 DIAGNOSIS — Z833 Family history of diabetes mellitus: Secondary | ICD-10-CM | POA: Diagnosis not present

## 2016-03-03 DIAGNOSIS — I729 Aneurysm of unspecified site: Secondary | ICD-10-CM | POA: Diagnosis not present

## 2016-03-03 DIAGNOSIS — I10 Essential (primary) hypertension: Secondary | ICD-10-CM | POA: Diagnosis not present

## 2016-03-03 DIAGNOSIS — S301XXA Contusion of abdominal wall, initial encounter: Secondary | ICD-10-CM | POA: Diagnosis present

## 2016-03-03 DIAGNOSIS — I481 Persistent atrial fibrillation: Principal | ICD-10-CM | POA: Diagnosis present

## 2016-03-03 DIAGNOSIS — T81718A Complication of other artery following a procedure, not elsewhere classified, initial encounter: Secondary | ICD-10-CM | POA: Diagnosis present

## 2016-03-03 DIAGNOSIS — I456 Pre-excitation syndrome: Secondary | ICD-10-CM | POA: Diagnosis not present

## 2016-03-03 DIAGNOSIS — R1031 Right lower quadrant pain: Secondary | ICD-10-CM

## 2016-03-03 DIAGNOSIS — I998 Other disorder of circulatory system: Secondary | ICD-10-CM | POA: Diagnosis not present

## 2016-03-03 MED ORDER — ACETAMINOPHEN 325 MG PO TABS
650.0000 mg | ORAL_TABLET | ORAL | Status: DC | PRN
  Filled 2016-03-03: qty 2

## 2016-03-03 MED ORDER — CLOPIDOGREL BISULFATE 75 MG PO TABS
75.0000 mg | ORAL_TABLET | Freq: Every day | ORAL | Status: DC
  Filled 2016-03-03: qty 1

## 2016-03-03 MED ORDER — RIVAROXABAN 20 MG PO TABS
20.0000 mg | ORAL_TABLET | Freq: Every day | ORAL | Status: DC
  Administered 2016-03-03: 20 mg via ORAL
  Filled 2016-03-03: qty 1

## 2016-03-03 MED ORDER — METOPROLOL SUCCINATE ER 50 MG PO TB24: ORAL_TABLET | ORAL | 3 refills | Status: DC

## 2016-03-03 MED ORDER — MOMETASONE FURO-FORMOTEROL FUM 200-5 MCG/ACT IN AERO
2.0000 | INHALATION_SPRAY | Freq: Two times a day (BID) | RESPIRATORY_TRACT | Status: DC
  Administered 2016-03-04: 2 via RESPIRATORY_TRACT
  Filled 2016-03-03: qty 8.8

## 2016-03-03 MED ORDER — ONDANSETRON HCL 4 MG/2ML IJ SOLN
4.0000 mg | Freq: Four times a day (QID) | INTRAMUSCULAR | Status: DC | PRN
  Administered 2016-03-03 – 2016-03-04 (×3): 4 mg via INTRAVENOUS

## 2016-03-03 MED ORDER — ONDANSETRON HCL 4 MG/2ML IJ SOLN
4.0000 mg | Freq: Four times a day (QID) | INTRAMUSCULAR | Status: DC | PRN
  Filled 2016-03-03 (×2): qty 2

## 2016-03-03 MED ORDER — HYDROCODONE-ACETAMINOPHEN 5-325 MG PO TABS: 1.0000 | ORAL_TABLET | Freq: Four times a day (QID) | ORAL | Status: DC | PRN

## 2016-03-03 MED ORDER — HYDROMORPHONE HCL 1 MG/ML IJ SOLN
1.0000 mg | INTRAMUSCULAR | Status: DC | PRN
  Administered 2016-03-04: 1 mg via INTRAVENOUS
  Filled 2016-03-03: qty 1

## 2016-03-03 MED ORDER — HYDROMORPHONE HCL 1 MG/ML IJ SOLN
INTRAMUSCULAR | Status: AC
  Filled 2016-03-03: qty 2

## 2016-03-03 MED ORDER — HYDROMORPHONE HCL 1 MG/ML IJ SOLN
2.0000 mg | Freq: Once | INTRAMUSCULAR | Status: AC
  Administered 2016-03-03: 2 mg via INTRAVENOUS
  Filled 2016-03-03: qty 2

## 2016-03-03 MED ORDER — OXYBUTYNIN CHLORIDE 5 MG PO TABS: 2.5000 mg | ORAL_TABLET | Freq: Two times a day (BID) | ORAL | Status: DC | PRN

## 2016-03-03 MED ORDER — PANTOPRAZOLE SODIUM 40 MG PO TBEC
40.0000 mg | DELAYED_RELEASE_TABLET | Freq: Every day | ORAL | Status: DC
  Administered 2016-03-04: 40 mg via ORAL
  Filled 2016-03-03: qty 1

## 2016-03-03 MED ORDER — ENALAPRIL MALEATE 10 MG PO TABS
10.0000 mg | ORAL_TABLET | Freq: Every day | ORAL | Status: DC
  Administered 2016-03-04: 10 mg via ORAL
  Filled 2016-03-03: qty 1

## 2016-03-03 MED ORDER — ACETAMINOPHEN 325 MG PO TABS
650.0000 mg | ORAL_TABLET | ORAL | Status: DC | PRN
  Administered 2016-03-04: 650 mg via ORAL

## 2016-03-03 MED ORDER — METOPROLOL SUCCINATE ER 100 MG PO TB24
100.0000 mg | ORAL_TABLET | Freq: Every day | ORAL | Status: DC
  Administered 2016-03-03 – 2016-03-04 (×2): 100 mg via ORAL
  Filled 2016-03-03 (×2): qty 1

## 2016-03-03 MED ORDER — HYDROMORPHONE HCL 1 MG/ML IJ SOLN
INTRAMUSCULAR | Status: AC
  Administered 2016-03-03: 2 mg
  Filled 2016-03-03: qty 1

## 2016-03-03 NOTE — Telephone Encounter (Signed)
Report given to adriene on 2W. Pt taken by wheelchair to room. Pt alert and oriented upon arrival.

## 2016-03-03 NOTE — Progress Notes (Signed)
Primary Care Physician: Mickie Hillier, MD EP Physician: Dr. Rayann Heman Cardiologist: Dr. Cecile Sheerer is a 50 y.o. male with a h/o afib with third ablation performed 02/18/16. He had cardioversion 10/26, and c/o of chest pain after the procedure. He was admitted and underwent cardiac catherization 02/03/16 and received a stent of the LAD. He has not had f/u with general cardiology, but was told an appointment with Dr. Bronson Ing would be pending.  He was suppose to go back to work yesterday but called to be seen due to continued pain of rt groin subsequent to hematoma at time of catherization and feeling that the pain at the groin would keep him from doing his job requiring  him to stand on his feet and do heavy lifting.  He is also in rapid afib at 138 bpm, but he was unaware of this. He has not been checking his HR/BP at home and does not know if the afib is persistent.  The continued groin discomfort is more of a concern to him right now.  Today, he denies symptoms of palpitations, chest pain, shortness of breath, orthopnea, PND, lower extremity edema, dizziness, presyncope, syncope, or neurologic sequela. The patient is tolerating medications without difficulties and is otherwise without complaint today.   Past Medical History:  Diagnosis Date  . Asthma   . GERD (gastroesophageal reflux disease)   . Headache   . High cholesterol   . History of DVT (deep vein thrombosis)    a. left leg following ablation for SVT  . Hypertension   . Persistent atrial fibrillation ([place])    a. Dx 08/2012, xarelto initiated; b. 09/2012 Tikosyn initiated -> DCCV -> Sinus c. PVI 03/2013 d. PVI 03/2014  . Sleep apnea    does not use CPAP (he reports having uvulectomy)   . Wolff-Parkinson-White (WPW) syndrome    a. s/p RFCA 2001 by Dr Caryl Comes, pt reports complicated by post procedure DVT   Past Surgical History:  Procedure Laterality Date  . ATRIAL FIBRILLATION ABLATION  03/23/2013   PVI by  DR Rayann Heman for afib  . ATRIAL FIBRILLATION ABLATION N/A 03/23/2013   Procedure: ATRIAL FIBRILLATION ABLATION;  Surgeon: Coralyn Mark, MD;  Location: [place] CATH LAB;  Service: Cardiovascular;  Laterality: N/A;  . ATRIAL FIBRILLATION ABLATION N/A 03/27/2014   PVI Dr Rayann Heman  . BACK SURGERY    . CARDIAC CATHETERIZATION N/A 02/03/2016   Procedure: Left Heart Cath and Coronary Angiography;  Surgeon: Nelva Bush, MD;  Location: [place] CV LAB;  Service: Cardiovascular;  Laterality: N/A;  . CARDIAC CATHETERIZATION N/A 02/03/2016   Procedure: Intravascular Pressure Wire/FFR Study;  Surgeon: Nelva Bush, MD;  Location: [place] CV LAB;  Service: Cardiovascular;  Laterality: N/A;  . CARDIAC CATHETERIZATION N/A 02/03/2016   Procedure: Coronary Stent Intervention;  Surgeon: Nelva Bush, MD;  Location: [place] CV LAB;  Service: Cardiovascular;  Laterality: N/A;  . CARDIAC SURGERY    . CARDIOVERSION N/A 08/25/2012   Procedure: CARDIOVERSION;  Surgeon: Thayer Headings, MD;  Location: [facility] ENDOSCOPY;  Service: Cardiovascular;  Laterality: N/A;  . CARDIOVERSION N/A 09/06/2012   Procedure: CARDIOVERSION/Bedside;  Surgeon: Carlena Bjornstad, MD;  Location: [place];  Service: Cardiovascular;  Laterality: N/A;  . CARDIOVERSION N/A 01/12/2014   Procedure: CARDIOVERSION;  Surgeon: Pixie Casino, MD;  Location: [facility] ENDOSCOPY;  Service: Cardiovascular;  Laterality: N/A;  . CARDIOVERSION N/A 01/30/2016   Procedure: CARDIOVERSION;  Surgeon: Sueanne Margarita, MD;  Location: [facility]  ENDOSCOPY;  Service: Cardiovascular;  Laterality: N/A;  . COLONOSCOPY N/A 12/04/2015   Procedure: COLONOSCOPY;  Surgeon: Rogene Houston, MD;  [facility];  Service: Endoscopy;  Laterality: N/A;  730  . ELECTROPHYSIOLOGIC STUDY N/A 02/18/2016   Procedure: Atrial Fibrillation Ablation;  Surgeon: Thompson Grayer, MD;  Location: [place] CV LAB;  Service: Cardiovascular;  Laterality: N/A;  . LOOP RECORDER IMPLANT N/A 06/29/2014    Procedure: LOOP RECORDER IMPLANT;  Surgeon: Thompson Grayer, MD;  Location: [facility] CATH LAB;  Service: Cardiovascular;  Laterality: N/A;  . NOSE SURGERY     sleep apnea surgery  . STOMACH SURGERY     reflux, fundoplication  . TEE WITHOUT CARDIOVERSION N/A 08/25/2012   Procedure: TRANSESOPHAGEAL ECHOCARDIOGRAM (TEE);  Surgeon: Thayer Headings, MD;  Location: [place];  Service: Cardiovascular;  Laterality: N/A;  . TEE WITHOUT CARDIOVERSION  03/23/2013   DR ROSS  . TEE WITHOUT CARDIOVERSION N/A 03/23/2013   Procedure: TRANSESOPHAGEAL ECHOCARDIOGRAM (TEE);  Surgeon: Fay Records, MD;  Location: [facility] ENDOSCOPY;  Service: Cardiovascular;  Laterality: N/A;  . TEE WITHOUT CARDIOVERSION N/A 03/26/2014   Procedure: TRANSESOPHAGEAL ECHOCARDIOGRAM (TEE);  Surgeon: Josue Hector, MD;  Location: [facility] ENDOSCOPY;  Service: Cardiovascular;  Laterality: N/A;  . TONSILLECTOMY      Current Outpatient Prescriptions  Medication Sig Dispense Refill  . albuterol (PROVENTIL HFA) 108 (90 Base) MCG/ACT inhaler Inhale 2 puffs into the lungs every 4 (four) hours as needed for wheezing or shortness of breath. 1 Inhaler 0  . atorvastatin (LIPITOR) 10 MG tablet Take 1 tablet (10 mg total) by mouth daily. 30 tablet 0  . Cholecalciferol (VITAMIN D) 2000 UNITS CAPS Take 2 capsules by mouth daily.     . clopidogrel (PLAVIX) 75 MG tablet Take 1 tablet (75 mg total) by mouth daily with breakfast. 90 tablet 3  . enalapril (VASOTEC) 10 MG tablet Take 1 tablet (10 mg total) by mouth daily. 90 tablet 3  . Fluticasone-Salmeterol (ADVAIR DISKUS) 250-50 MCG/DOSE AEPB Inhale 1 puff into the lungs every 12 (twelve) hours. 60 each 0  . furosemide (LASIX) 40 MG tablet Take 1 tablet (40 mg total) by mouth daily. 30 tablet 0  . HYDROcodone-acetaminophen (NORCO/VICODIN) 5-325 MG tablet Take 1 tablet by mouth every 6 (six) hours as needed for moderate pain. 10 tablet 0  . metoprolol succinate (TOPROL-XL) 50 MG 24 hr tablet Take 1 tablet (50 mg  total) by mouth 2 (two) times daily. Take with or immediately following a meal. 180 tablet 3  . nitroGLYCERIN (NITROSTAT) 0.4 MG SL tablet Place 1 tablet (0.4 mg total) under the tongue every 5 (five) minutes x 3 doses as needed for chest pain. 75 tablet 1  . oxybutynin (DITROPAN) 5 MG tablet Take 0.5 tablets (2.5 mg total) by mouth 2 (two) times daily as needed for bladder spasms. 45 tablet 3  . pantoprazole (PROTONIX) 40 MG tablet Take 1 tablet (40 mg total) by mouth daily. 90 tablet 3  . Potassium Chloride ER 20 MEQ TBCR Take 1 tablet by mouth daily. 90 tablet 1  . rivaroxaban (XARELTO) 20 MG TABS tablet Take 1 tablet (20 mg total) by mouth daily with supper. 90 tablet 3  . tadalafil (CIALIS) 5 MG tablet Take 1 tablet (5 mg total) by mouth daily as needed for erectile dysfunction. 10 tablet 0  . triamcinolone cream (KENALOG) 0.1 % Apply 1 application topically 2 (two) times daily. Prn rash; use up to 2 weeks 30 g 0  .  ketoconazole (NIZORAL) 2 % cream Apply 1 application topically 2 (two) times daily. (Patient not taking: Reported on 03/03/2016) 30 g 4  . magnesium 30 MG tablet Take 30 mg by mouth 2 (two) times daily.    Marland Kitchen PROCTOZONE-HC 2.5 % rectal cream Apply 3 times daily (Patient not taking: Reported on 03/03/2016) 90 g 3   No current facility-administered medications for this encounter.     Allergies  Allergen Reactions  . Pollen Extract     Runny nose Watery eyes Nasal congestion    Social History   Social History  . Marital status: Married    Spouse name: N/A  . Number of children: 2  . Years of education: N/A   Occupational History  .  [employer]   Social History Main Topics  . Smoking status: Former Smoker    Packs/day: 1.00    Years: 7.00    Types: Cigarettes    Start date: 05/19/2003    Quit date: 08/06/2010  . Smokeless tobacco: Never Used  . Alcohol use No  . Drug use: No  . Sexual activity: Not on file   Other Topics Concern  . Not on file   Social History  Narrative   Pt lives in [place] with wife.  Works at [employer] History  Problem Relation Age of Onset  . CAD Father 40  . Diabetes Father   . Heart attack Father   . Transient ischemic attack Father   . Vascular Disease Father   . Diabetes Mother   . Anemia Mother   . Gout Mother   . Aneurysm Maternal Grandmother   . Stroke Maternal Grandfather   . Diabetes Paternal Grandmother   . Stroke Paternal Grandfather   . Diabetes Brother     ROS- All systems are reviewed and negative except as per the HPI above  Physical Exam: Vitals:   03/03/16 1428  BP: 140/88  Pulse: (!) 139  Weight: 263 lb 3.2 oz (119.4 kg)  Height: 6\' 1"  (1.854 m)    GEN- The patient is well appearing, alert and oriented x 3 today.   Head- normocephalic, atraumatic Eyes-  Sclera clear, conjunctiva pink Ears- hearing intact Oropharynx- clear Neck- supple, no JVP Lymph- no cervical lymphadenopathy Lungs- Clear to ausculation bilaterally, normal work of breathing Heart- Rapid, irregular rate and rhythm, no murmurs, rubs or gallops, PMI not laterally displaced GI- soft, NT, ND, + BS Extremities- no clubbing, cyanosis, or edema. Rt groin with resolving bruising, groin, scrotum very tender to palpation, soft bruit present MS- no significant deformity or atrophy Skin- no rash or lesion Psych- euthymic mood, full affect Neuro- strength and sensation are intact  EKG-afib with rvr at 139 bpm, qrs int 86 ms, qtc 489 ms Epic records reviewed  Assessment and Plan: 1. 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 Rayann Heman notified and plans to admit pt for manual compression Will admit to [place], unfortunately  can not stop drug Out of work note given   2. Afib With rvr, with need additional rate control  To be addressed as inpatient  3. CAD Stent placement of LAD 10/30 Continue plavix, cannot stop drug Continue BB, statin Will  need f/u with Dr. Melrose Nakayama C. Hurshel [place], [facility] [address] [place], [place] [address] [phone]

## 2016-03-03 NOTE — Patient Instructions (Signed)
Your physician has recommended you make the following change in your medication:  1)Increase metoprolol to 75mg twice a day  

## 2016-03-03 NOTE — H&P (Signed)
Primary Care Physician: Mickie Hillier, MD EP Physician: Dr. Rayann Heman Cardiologist: Dr. Cecile Sheerer is a 50 y.o. male with a h/o afib with third ablation performed 02/18/16. He had cardioversion 10/26, and c/o of chest pain after the procedure. He was admitted and underwent cardiac catherization 02/03/16 and received a stent of the LAD. He has not had f/u with general cardiology, but was told an appointment with Dr. Bronson Ing would be pending.  He was suppose to go back to work yesterday but called to be seen due to continued pain of rt groin subsequent to hematoma at time of catherization and feeling that the pain at the groin would keep him from doing his job requiring  him to stand on his feet and do heavy lifting.  He is also in rapid afib at 138 bpm, but he was unaware of this. He has not been checking his HR/BP at home and does not know if the afib is persistent.  The continued groin discomfort is more of a concern to him right now.  Today, he denies symptoms of palpitations, chest pain, shortness of breath, orthopnea, PND, lower extremity edema, dizziness, presyncope, syncope, or neurologic sequela. The patient is tolerating medications without difficulties and is otherwise without complaint today.       Past Medical History:  Diagnosis Date  . Asthma   . GERD (gastroesophageal reflux disease)   . Headache   . High cholesterol   . History of DVT (deep vein thrombosis)    a. left leg following ablation for SVT  . Hypertension   . Persistent atrial fibrillation ([place])    a. Dx 08/2012, xarelto initiated; b. 09/2012 Tikosyn initiated -> DCCV -> Sinus c. PVI 03/2013 d. PVI 03/2014  . Sleep apnea    does not use CPAP (he reports having uvulectomy)   . Wolff-Parkinson-White (WPW) syndrome    a. s/p RFCA 2001 by Dr Caryl Comes, pt reports complicated by post procedure DVT        Past Surgical History:  Procedure Laterality Date  . ATRIAL FIBRILLATION ABLATION   03/23/2013   PVI by DR Rayann Heman for afib  . ATRIAL FIBRILLATION ABLATION N/A 03/23/2013   Procedure: ATRIAL FIBRILLATION ABLATION;  Surgeon: Coralyn Mark, MD;  Location: [employer] CATH LAB;  Service: Cardiovascular;  Laterality: N/A;  . ATRIAL FIBRILLATION ABLATION N/A 03/27/2014   PVI Dr Rayann Heman  . BACK SURGERY    . CARDIAC CATHETERIZATION N/A 02/03/2016   Procedure: Left Heart Cath and Coronary Angiography;  Surgeon: Nelva Bush, MD;  Location: [place] CV LAB;  Service: Cardiovascular;  Laterality: N/A;  . CARDIAC CATHETERIZATION N/A 02/03/2016   Procedure: Intravascular Pressure Wire/FFR Study;  Surgeon: Nelva Bush, MD;  Location: [place] CV LAB;  Service: Cardiovascular;  Laterality: N/A;  . CARDIAC CATHETERIZATION N/A 02/03/2016   Procedure: Coronary Stent Intervention;  Surgeon: Nelva Bush, MD;  Location: [place] CV LAB;  Service: Cardiovascular;  Laterality: N/A;  . CARDIAC SURGERY    . CARDIOVERSION N/A 08/25/2012   Procedure: CARDIOVERSION;  Surgeon: Thayer Headings, MD;  Location: [facility] ENDOSCOPY;  Service: Cardiovascular;  Laterality: N/A;  . CARDIOVERSION N/A 09/06/2012   Procedure: CARDIOVERSION/Bedside;  Surgeon: Carlena Bjornstad, MD;  Location: [place];  Service: Cardiovascular;  Laterality: N/A;  . CARDIOVERSION N/A 01/12/2014   Procedure: CARDIOVERSION;  Surgeon: Pixie Casino, MD;  Location: [facility] ENDOSCOPY;  Service: Cardiovascular;  Laterality: N/A;  . CARDIOVERSION N/A 01/30/2016   Procedure: CARDIOVERSION;  Surgeon: Tressia Miners  Remonia Richter, MD;  Location: [place];  Service: Cardiovascular;  Laterality: N/A;  . COLONOSCOPY N/A 12/04/2015   Procedure: COLONOSCOPY;  Surgeon: Rogene Houston, MD;  [facility];  Service: Endoscopy;  Laterality: N/A;  730  . ELECTROPHYSIOLOGIC STUDY N/A 02/18/2016   Procedure: Atrial Fibrillation Ablation;  Surgeon: Thompson Grayer, MD;  Location: [place] CV LAB;  Service: Cardiovascular;  Laterality: N/A;  .  LOOP RECORDER IMPLANT N/A 06/29/2014   Procedure: LOOP RECORDER IMPLANT;  Surgeon: Thompson Grayer, MD;  Location: [facility] CATH LAB;  Service: Cardiovascular;  Laterality: N/A;  . NOSE SURGERY     sleep apnea surgery  . STOMACH SURGERY     reflux, fundoplication  . TEE WITHOUT CARDIOVERSION N/A 08/25/2012   Procedure: TRANSESOPHAGEAL ECHOCARDIOGRAM (TEE);  Surgeon: Thayer Headings, MD;  Location: [place];  Service: Cardiovascular;  Laterality: N/A;  . TEE WITHOUT CARDIOVERSION  03/23/2013   DR ROSS  . TEE WITHOUT CARDIOVERSION N/A 03/23/2013   Procedure: TRANSESOPHAGEAL ECHOCARDIOGRAM (TEE);  Surgeon: Fay Records, MD;  Location: [facility] ENDOSCOPY;  Service: Cardiovascular;  Laterality: N/A;  . TEE WITHOUT CARDIOVERSION N/A 03/26/2014   Procedure: TRANSESOPHAGEAL ECHOCARDIOGRAM (TEE);  Surgeon: Josue Hector, MD;  Location: [facility] ENDOSCOPY;  Service: Cardiovascular;  Laterality: N/A;  . TONSILLECTOMY            Current Outpatient Prescriptions  Medication Sig Dispense Refill  . albuterol (PROVENTIL HFA) 108 (90 Base) MCG/ACT inhaler Inhale 2 puffs into the lungs every 4 (four) hours as needed for wheezing or shortness of breath. 1 Inhaler 0  . atorvastatin (LIPITOR) 10 MG tablet Take 1 tablet (10 mg total) by mouth daily. 30 tablet 0  . Cholecalciferol (VITAMIN D) 2000 UNITS CAPS Take 2 capsules by mouth daily.     . clopidogrel (PLAVIX) 75 MG tablet Take 1 tablet (75 mg total) by mouth daily with breakfast. 90 tablet 3  . enalapril (VASOTEC) 10 MG tablet Take 1 tablet (10 mg total) by mouth daily. 90 tablet 3  . Fluticasone-Salmeterol (ADVAIR DISKUS) 250-50 MCG/DOSE AEPB Inhale 1 puff into the lungs every 12 (twelve) hours. 60 each 0  . furosemide (LASIX) 40 MG tablet Take 1 tablet (40 mg total) by mouth daily. 30 tablet 0  . HYDROcodone-acetaminophen (NORCO/VICODIN) 5-325 MG tablet Take 1 tablet by mouth every 6 (six) hours as needed for moderate pain. 10 tablet 0  . metoprolol  succinate (TOPROL-XL) 50 MG 24 hr tablet Take 1 tablet (50 mg total) by mouth 2 (two) times daily. Take with or immediately following a meal. 180 tablet 3  . nitroGLYCERIN (NITROSTAT) 0.4 MG SL tablet Place 1 tablet (0.4 mg total) under the tongue every 5 (five) minutes x 3 doses as needed for chest pain. 75 tablet 1  . oxybutynin (DITROPAN) 5 MG tablet Take 0.5 tablets (2.5 mg total) by mouth 2 (two) times daily as needed for bladder spasms. 45 tablet 3  . pantoprazole (PROTONIX) 40 MG tablet Take 1 tablet (40 mg total) by mouth daily. 90 tablet 3  . Potassium Chloride ER 20 MEQ TBCR Take 1 tablet by mouth daily. 90 tablet 1  . rivaroxaban (XARELTO) 20 MG TABS tablet Take 1 tablet (20 mg total) by mouth daily with supper. 90 tablet 3  . tadalafil (CIALIS) 5 MG tablet Take 1 tablet (5 mg total) by mouth daily as needed for erectile dysfunction. 10 tablet 0  . triamcinolone cream (KENALOG) 0.1 % Apply 1 application topically 2 (two) times  daily. Prn rash; use up to 2 weeks 30 g 0  . ketoconazole (NIZORAL) 2 % cream Apply 1 application topically 2 (two) times daily. (Patient not taking: Reported on 03/03/2016) 30 g 4  . magnesium 30 MG tablet Take 30 mg by mouth 2 (two) times daily.    Marland Kitchen PROCTOZONE-HC 2.5 % rectal cream Apply 3 times daily (Patient not taking: Reported on 03/03/2016) 90 g 3   No current facility-administered medications for this encounter.          Allergies  Allergen Reactions  . Pollen Extract     Runny nose Watery eyes Nasal congestion    Social History        Social History  . Marital status: Married    Spouse name: N/A  . Number of children: 2  . Years of education: N/A       Occupational History  .  [employer]        Social History Main Topics  . Smoking status: Former Smoker    Packs/day: 1.00    Years: 7.00    Types: Cigarettes    Start date: 05/19/2003    Quit date: 08/06/2010  . Smokeless tobacco: Never Used  . Alcohol use  No  . Drug use: No  . Sexual activity: Not on file       Other Topics Concern  . Not on file      Social History Narrative   Pt lives in [place] with wife.  Works at [employer] History  Problem Relation Age of Onset  . CAD Father 74  . Diabetes Father   . Heart attack Father   . Transient ischemic attack Father   . Vascular Disease Father   . Diabetes Mother   . Anemia Mother   . Gout Mother   . Aneurysm Maternal Grandmother   . Stroke Maternal Grandfather   . Diabetes Paternal Grandmother   . Stroke Paternal Grandfather   . Diabetes Brother     ROS- All systems are reviewed and negative except as per the HPI above  Physical Exam:    Vitals:   03/03/16 1428  BP: 140/88  Pulse: (!) 139  Weight: 263 lb 3.2 oz (119.4 kg)  Height: 6\' 1"  (1.854 m)    GEN- The patient is well appearing, alert and oriented x 3 today.   Head- normocephalic, atraumatic Eyes-  Sclera clear, conjunctiva pink Ears- hearing intact Oropharynx- clear Neck- supple, no JVP Lymph- no cervical lymphadenopathy Lungs- Clear to ausculation bilaterally, normal work of breathing Heart- Rapid, irregular rate and rhythm, no murmurs, rubs or gallops, PMI not laterally displaced GI- soft, NT, ND, + BS Extremities- no clubbing, cyanosis, or edema. Rt groin with resolving bruising, groin, scrotum very tender to palpation, soft bruit present MS- no significant deformity or atrophy Skin- no rash or lesion Psych- euthymic mood, full affect Neuro- strength and sensation are intact  EKG-afib with rvr at 139 bpm, qrs int 86 ms, qtc 489 ms Epic records reviewed  Assessment and Plan: 1. 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 Rayann Heman notified and plans to admit pt for manual compression Will admit to [place], unfortunately  can not stop drug Out of work note given   2. Afib With rvr, with need  additional rate control  To be addressed as inpatient  3. CAD Stent placement of LAD 10/30 Continue  plavix, cannot stop drug Continue BB, statin Will need f/u with Dr. Melrose Nakayama C. [place], [facility] [address] [place], [place] [address] [phone]   I have seen, examined the patient, and reviewed the above assessment and plan.  On exam, tachycardic irregular rhythm.  Pt with loud R femoral bruit.  2+ DP/PT pulses.  Changes to above are made where necessary.  Will admit for compression of pseudoaneurysm.  I have discussed with Dr Trula Slade.  I am optimistic that surgery can be avoided.  Co Sign: Thompson Grayer, MD 03/03/2016 5:16 PM

## 2016-03-03 NOTE — Progress Notes (Signed)
VASCULAR LAB PRELIMINARY  PRELIMINARY  PRELIMINARY  PRELIMINARY  Right lower extremity duplex completed.    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 There is a long irregular neck with an abnormal flow pattern. Unable to actually visualize the origin of the neck. Pseudo runs medial into the pubic region.  Zinedine Ellner, RVS 03/03/2016, 4:05 PM

## 2016-03-03 NOTE — Progress Notes (Addendum)
Patient admitted to RM 2W33, A&Ox4, VSS. Tele applied and CCMD notified. Patient oriented to room and call light placed within reach. No questions at this time.

## 2016-03-04 ENCOUNTER — Inpatient Hospital Stay (HOSPITAL_COMMUNITY): Payer: BLUE CROSS/BLUE SHIELD

## 2016-03-04 ENCOUNTER — Encounter: Payer: Self-pay | Admitting: Cardiovascular Disease

## 2016-03-04 ENCOUNTER — Other Ambulatory Visit (HOSPITAL_COMMUNITY): Payer: Self-pay | Admitting: *Deleted

## 2016-03-04 DIAGNOSIS — M79609 Pain in unspecified limb: Secondary | ICD-10-CM

## 2016-03-04 DIAGNOSIS — Z9889 Other specified postprocedural states: Secondary | ICD-10-CM | POA: Diagnosis not present

## 2016-03-04 DIAGNOSIS — I998 Other disorder of circulatory system: Secondary | ICD-10-CM

## 2016-03-04 DIAGNOSIS — I481 Persistent atrial fibrillation: Secondary | ICD-10-CM

## 2016-03-04 DIAGNOSIS — Z87891 Personal history of nicotine dependence: Secondary | ICD-10-CM | POA: Diagnosis not present

## 2016-03-04 DIAGNOSIS — Z8249 Family history of ischemic heart disease and other diseases of the circulatory system: Secondary | ICD-10-CM | POA: Diagnosis not present

## 2016-03-04 DIAGNOSIS — T81718A Complication of other artery following a procedure, not elsewhere classified, initial encounter: Secondary | ICD-10-CM

## 2016-03-04 DIAGNOSIS — S301XXA Contusion of abdominal wall, initial encounter: Secondary | ICD-10-CM | POA: Diagnosis not present

## 2016-03-04 DIAGNOSIS — K219 Gastro-esophageal reflux disease without esophagitis: Secondary | ICD-10-CM | POA: Diagnosis not present

## 2016-03-04 DIAGNOSIS — G4730 Sleep apnea, unspecified: Secondary | ICD-10-CM | POA: Diagnosis not present

## 2016-03-04 DIAGNOSIS — I729 Aneurysm of unspecified site: Secondary | ICD-10-CM

## 2016-03-04 DIAGNOSIS — Z7901 Long term (current) use of anticoagulants: Secondary | ICD-10-CM | POA: Diagnosis not present

## 2016-03-04 DIAGNOSIS — Z833 Family history of diabetes mellitus: Secondary | ICD-10-CM | POA: Diagnosis not present

## 2016-03-04 DIAGNOSIS — I2510 Atherosclerotic heart disease of native coronary artery without angina pectoris: Secondary | ICD-10-CM | POA: Diagnosis not present

## 2016-03-04 DIAGNOSIS — I456 Pre-excitation syndrome: Secondary | ICD-10-CM | POA: Diagnosis not present

## 2016-03-04 DIAGNOSIS — Z823 Family history of stroke: Secondary | ICD-10-CM | POA: Diagnosis not present

## 2016-03-04 DIAGNOSIS — E785 Hyperlipidemia, unspecified: Secondary | ICD-10-CM | POA: Diagnosis not present

## 2016-03-04 DIAGNOSIS — I10 Essential (primary) hypertension: Secondary | ICD-10-CM | POA: Diagnosis not present

## 2016-03-04 DIAGNOSIS — J45909 Unspecified asthma, uncomplicated: Secondary | ICD-10-CM | POA: Diagnosis not present

## 2016-03-04 DIAGNOSIS — Z86718 Personal history of other venous thrombosis and embolism: Secondary | ICD-10-CM | POA: Diagnosis not present

## 2016-03-04 LAB — CBC
HCT: 40.6 % (ref 39.0–52.0)
Hemoglobin: 13.4 g/dL (ref 13.0–17.0)
MCH: 31.6 pg (ref 26.0–34.0)
MCHC: 33 g/dL (ref 30.0–36.0)
MCV: 95.8 fL (ref 78.0–100.0)
PLATELETS: 390 10*3/uL (ref 150–400)
RBC: 4.24 MIL/uL (ref 4.22–5.81)
RDW: 14.1 % (ref 11.5–15.5)
WBC: 18.7 10*3/uL — ABNORMAL HIGH (ref 4.0–10.5)

## 2016-03-04 LAB — BASIC METABOLIC PANEL
Anion gap: 13 (ref 5–15)
BUN: 17 mg/dL (ref 6–20)
CALCIUM: 9.6 mg/dL (ref 8.9–10.3)
CHLORIDE: 105 mmol/L (ref 101–111)
CO2: 24 mmol/L (ref 22–32)
CREATININE: 1.2 mg/dL (ref 0.61–1.24)
GFR calc non Af Amer: >60 mL/min (ref >60)
GLUCOSE: 119 mg/dL — AB (ref 65–99)
Potassium: 4.6 mmol/L (ref 3.5–5.1)
Sodium: 142 mmol/L (ref 135–145)

## 2016-03-04 LAB — VAS US LOWER EXTREMITY ARTERIAL DUPLEX: RSFPPSV: -94 cm/s

## 2016-03-04 MED ORDER — METOPROLOL SUCCINATE ER 50 MG PO TB24: ORAL_TABLET | ORAL | 3 refills | Status: DC

## 2016-03-04 NOTE — Progress Notes (Signed)
Preliminary results by tech - Right Groin Pseudoaneurysm Check Completed.  A small active residual sac is still present measuring approximately 1.2 x 1.1 cm after 20 minutes of compression.  Oda Cogan, BS, RDMS, RVT

## 2016-03-04 NOTE — Discharge Summary (Signed)
ELECTROPHYSIOLOGY PROCEDURE DISCHARGE SUMMARY    Patient ID: Austin Woods,  MRN: [id number], DOB/AGE: October 21, 1965 50 y.o.  Admit date: 03/03/2016 Discharge date: 03/04/2016  Primary Care Physician: Mickie Hillier, MD Primary Cardiologist: Bronson Ing Electrophysiologist: Daltyn Degroat  Primary Discharge Diagnosis:  Active Problems:   Pseudoaneurysm following procedure ([place])  Secondary Diagnoses: 1.  Persistent atrial fibrillation 2.  Hypertension 3.  WPW s/p ablation 4.  Hyperlipidemia   Allergies  Allergen Reactions  . Pollen Extract     Runny nose Watery eyes Nasal congestion    Brief HPI/Hospital Course:  Austin Woods is a 50 y.o. male with a past medical history as outlined above. He underwent recent ablation for atrial fibrillation. Post procedure, he had persistent right groin pain as well as swelling.  He was seen in the AF clinic yesterday and vascular ultrasound was done which showed large pseudoaneurysm.  He underwent manual compression by Dr Rayann Heman last night with significant improvement in pseudoaneurysm by ultrasound this morning.  He is felt to be stable for discharge to home today with significant activity restrictions. Will have follow up ultrasound next week.  He has been advised to call with increased pain/swelling. No work for at least 2 weeks.   Physical Exam: Vitals:   03/03/16 2032 03/04/16 0412 03/04/16 0828 03/04/16 1244  BP: 126/74 108/62  124/85  Pulse: (!) 115 (!) 110  (!) 113  Resp: 16 17  18   Temp:  98.3 F (36.8 C)  98.6 F (37 C)  TempSrc:  Oral  Oral  SpO2: 96% 97% 99% 98%   Labs:   Lab Results  Component Value Date   WBC 18.7 (H) 03/04/2016   HGB 13.4 03/04/2016   HCT 40.6 03/04/2016   MCV 95.8 03/04/2016   PLT 390 03/04/2016     Recent Labs Lab 03/04/16 0429  NA 142  K 4.6  CL 105  CO2 24  BUN 17  CREATININE 1.20  CALCIUM 9.6  GLUCOSE 119*     Discharge Medications:  Current Discharge Medication List      CONTINUE these medications which have CHANGED   Details  metoprolol succinate (TOPROL-XL) 50 MG 24 hr tablet Take 2 tablets (100mg ) by mouth twice a day Qty: 180 tablet, Refills: 3      CONTINUE these medications which have NOT CHANGED   Details  albuterol (PROVENTIL HFA) 108 (90 Base) MCG/ACT inhaler Inhale 2 puffs into the lungs every 4 (four) hours as needed for wheezing or shortness of breath. Qty: 1 Inhaler, Refills: 0    atorvastatin (LIPITOR) 10 MG tablet Take 1 tablet (10 mg total) by mouth daily. Qty: 30 tablet, Refills: 0    Cholecalciferol (VITAMIN D) 2000 UNITS CAPS Take 2 capsules by mouth daily.     clopidogrel (PLAVIX) 75 MG tablet Take 1 tablet (75 mg total) by mouth daily with breakfast. Qty: 90 tablet, Refills: 3    enalapril (VASOTEC) 10 MG tablet Take 1 tablet (10 mg total) by mouth daily. Qty: 90 tablet, Refills: 3    Fluticasone-Salmeterol (ADVAIR DISKUS) 250-50 MCG/DOSE AEPB Inhale 1 puff into the lungs every 12 (twelve) hours. Qty: 60 each, Refills: 0    furosemide (LASIX) 40 MG tablet Take 1 tablet (40 mg total) by mouth daily. Qty: 30 tablet, Refills: 0    HYDROcodone-acetaminophen (NORCO/VICODIN) 5-325 MG tablet Take 1 tablet by mouth every 6 (six) hours as needed for moderate pain. Qty: 10 tablet, Refills: 0    magnesium 30 MG  tablet Take 30 mg by mouth 2 (two) times daily.    nitroGLYCERIN (NITROSTAT) 0.4 MG SL tablet Place 1 tablet (0.4 mg total) under the tongue every 5 (five) minutes x 3 doses as needed for chest pain. Qty: 75 tablet, Refills: 1    oxybutynin (DITROPAN) 5 MG tablet Take 0.5 tablets (2.5 mg total) by mouth 2 (two) times daily as needed for bladder spasms. Qty: 45 tablet, Refills: 3    pantoprazole (PROTONIX) 40 MG tablet Take 1 tablet (40 mg total) by mouth daily. Qty: 90 tablet, Refills: 3    Potassium Chloride ER 20 MEQ TBCR Take 1 tablet by mouth daily. Qty: 90 tablet, Refills: 1    rivaroxaban (XARELTO) 20 MG TABS  tablet Take 1 tablet (20 mg total) by mouth daily with supper. Qty: 90 tablet, Refills: 3    tadalafil (CIALIS) 5 MG tablet Take 1 tablet (5 mg total) by mouth daily as needed for erectile dysfunction. Qty: 10 tablet, Refills: 0    triamcinolone cream (KENALOG) 0.1 % Apply 1 application topically 2 (two) times daily. Prn rash; use up to 2 weeks Qty: 30 g, Refills: 0    ketoconazole (NIZORAL) 2 % cream Apply 1 application topically 2 (two) times daily. Qty: 30 g, Refills: 4    PROCTOZONE-HC 2.5 % rectal cream Apply 3 times daily Qty: 90 g, Refills: 3        Disposition: Pt is being discharged home today in good condition. Discharge Instructions    Diet - low sodium heart healthy    Complete by:  As directed    Increase activity slowly    Complete by:  As directed      Follow-up Information    [facility] Follow up on 03/12/2016.   Specialty:  Cardiology Why:  at [place] for repeat uktrasound and appt with Roderic Palau, NP Contact information: [address] [id number] [place] [phone]          Duration of Discharge Encounter: Greater than 30 minutes including physician time.  Signed, Chanetta Marshall, NP 03/04/2016 2:58 PM   I have seen, examined the patient, and reviewed the above assessment and plan.  On exam, no groin bruit after compression of pseudoaneurysm.  On [place], >90% resolution of pseudoaneurysm.  Felt to likely spontaneously resolve going forward.  Afib stable. Changes to above are made where necessary.  DC to home.  Repeat US in 1 week as per recommendation by Dr Burt Knack (discussed by phone) with follow-up in AF clinic at that time.  Toprol has been increased.    Co Sign: Thompson Grayer, MD

## 2016-03-04 NOTE — Discharge Instructions (Signed)
Do not return to work for at least 2 weeks.

## 2016-03-04 NOTE — Progress Notes (Signed)
    SUBJECTIVE: At this time, he denies chest pain, shortness of breath. Right groin pain improved.   CURRENT MEDICATIONS: . clopidogrel  75 mg Oral Q breakfast  . enalapril  10 mg Oral Daily  . metoprolol succinate  100 mg Oral Daily  . mometasone-formoterol  2 puff Inhalation BID  . pantoprazole  40 mg Oral Daily  . rivaroxaban  20 mg Oral Q supper     OBJECTIVE: Physical Exam: Vitals:   03/03/16 1700 03/03/16 2032 03/04/16 0412  BP: 114/83 126/74 108/62  Pulse: 99 (!) 115 (!) 110  Resp: 18 16 17   Temp: 98.3 F (36.8 C)  98.3 F (36.8 C)  TempSrc: Oral  Oral  SpO2: 100% 96% 97%    Intake/Output Summary (Last 24 hours) at 03/04/16 0656 Last data filed at 03/04/16 0400  Gross per 24 hour  Intake              350 ml  Output              200 ml  Net              150 ml    Telemetry reveals atrial fibrillation  GEN- The patient is well appearing, alert and oriented x 3 today.   Head- normocephalic, atraumatic Eyes-  Sclera clear, conjunctiva pink Ears- hearing intact Oropharynx- clear Neck- supple  Lungs- Clear to ausculation bilaterally, normal work of breathing Heart- Irregular rate and rhythm  GI- soft, NT, ND, + BS Extremities- no clubbing, cyanosis, or edema, right groin without bruit, +soft hematoma Skin- no rash or lesion Psych- euthymic mood, full affect Neuro- strength and sensation are intact  ASSESSMENT AND PLAN:  Active Problems:   Pseudoaneurysm following procedure ([place])  1.  Right groin pseudoaneurysm S/p manual compression by Dr Rayann Heman yesterday Pending repeat ultrasound this morning Pain improved per patient  2.  Persistent atrial fibrillation S/p recent PVI Continue Xarelto without interruption CHADS2VASC is 1  3.  CAD No recent ischemic symptoms Recent LAD stent and cannot interrupt Plavix for now  Chanetta Marshall, NP 03/04/2016 7:00 AM  Extensive manual pressure held last evening.  Pseudoaneurysm nearly resolved on [place].   Additional manual compression held by [place] tech and EP NP.  I have spoken with Dr Burt Knack who feels that the residual pseudoaneurysm will likely spontaneously resolve.  Unfortunately given recent PCI and PVI, cannot hold plavix or xarelto.  Will follow closely in AF clinic with repeat US next week.  Thompson Grayer, MD

## 2016-03-06 ENCOUNTER — Encounter (HOSPITAL_COMMUNITY): Payer: Self-pay | Admitting: *Deleted

## 2016-03-06 ENCOUNTER — Telehealth: Payer: Self-pay | Admitting: Internal Medicine

## 2016-03-06 NOTE — Telephone Encounter (Signed)
Per discharge instructions pt should be out of work at least 2 more weeks. Letter faxed per request. Instructed patient to make appointment with Dr. Jacinta Shoe as he will need to see patient from stent placement standpoint prior to returning to work as well.

## 2016-03-06 NOTE — Telephone Encounter (Signed)
New message  Pt is calling to get a corrected note for work  The one that was sent was unaccepted per his work  Needs note on [place] stating pt is unable to work with dates  Fax: [phone]

## 2016-03-12 ENCOUNTER — Other Ambulatory Visit: Payer: Self-pay

## 2016-03-12 ENCOUNTER — Ambulatory Visit (HOSPITAL_COMMUNITY)
Admit: 2016-03-12 | Discharge: 2016-03-12 | Disposition: A | Discharge status: Home / Self Care | Payer: BLUE CROSS/BLUE SHIELD | Attending: Nurse Practitioner | Admitting: Nurse Practitioner

## 2016-03-12 ENCOUNTER — Ambulatory Visit (HOSPITAL_COMMUNITY)
Admission: RE | Admit: 2016-03-12 | Discharge: 2016-03-12 | Disposition: A | Discharge status: Home / Self Care | Payer: BLUE CROSS/BLUE SHIELD | Source: Ambulatory Visit | Attending: Internal Medicine | Admitting: Internal Medicine

## 2016-03-12 DIAGNOSIS — I729 Aneurysm of unspecified site: Secondary | ICD-10-CM

## 2016-03-12 DIAGNOSIS — I998 Other disorder of circulatory system: Secondary | ICD-10-CM | POA: Diagnosis not present

## 2016-03-12 DIAGNOSIS — T81718A Complication of other artery following a procedure, not elsewhere classified, initial encounter: Secondary | ICD-10-CM

## 2016-03-12 NOTE — Progress Notes (Addendum)
VASCULAR LAB PRELIMINARY  PRELIMINARY  PRELIMINARY  PRELIMINARY  Right ultrasound of groin completed.    Preliminary report:  The previously, mostly closed pseudoaneurysm has reopened.  Called report to AFib clinic  Jacobey Gura, RVT 03/12/2016, 10:26 AM

## 2016-03-12 NOTE — Telephone Encounter (Signed)
Austin Woods is calling about paper work for his disability . States that [name] has been out of work since 01/29/16. Please call

## 2016-03-12 NOTE — Progress Notes (Signed)
Pt was scheduled to be seen in afib clinic f/u U/S of RT groin last week showing pseudoaneurysm which was compressed by Dr. Rayann Heman. Unfortunately, the pseudoaneurysm has returned. Therefore, pt was not seen in clinic but Dr. Rayann Heman made aware  of u/s results and Dr. Rayann Heman arranged for the patient to have outpatient  thrombin injection tomorrow with Dr. Trula Slade.

## 2016-03-12 NOTE — Telephone Encounter (Signed)
I have refaxed the paperwork for disability again today to Austin Woods. Instructed Mr. Marder to ask Dr. Trula Slade from surgical standpoint after procedure tomorrow when he can return to work and call me with this recommendation.

## 2016-03-13 ENCOUNTER — Ambulatory Visit (HOSPITAL_BASED_OUTPATIENT_CLINIC_OR_DEPARTMENT_OTHER)
Admission: RE | Admit: 2016-03-13 | Discharge: 2016-03-13 | Disposition: A | Discharge status: Home / Self Care | Payer: BLUE CROSS/BLUE SHIELD | Source: Ambulatory Visit | Attending: Surgery | Admitting: Surgery

## 2016-03-13 ENCOUNTER — Encounter (HOSPITAL_COMMUNITY): Payer: Self-pay | Admitting: *Deleted

## 2016-03-13 ENCOUNTER — Encounter (HOSPITAL_COMMUNITY)
Admission: RE | Disposition: A | Discharge status: Home / Self Care | Payer: Self-pay | Source: Ambulatory Visit | Attending: Surgery

## 2016-03-13 ENCOUNTER — Ambulatory Visit (HOSPITAL_COMMUNITY)
Admission: RE | Admit: 2016-03-13 | Discharge: 2016-03-13 | Disposition: A | Discharge status: Home / Self Care | Payer: BLUE CROSS/BLUE SHIELD | Source: Ambulatory Visit | Attending: Surgery | Admitting: Surgery

## 2016-03-13 DIAGNOSIS — M85551 Aneurysmal bone cyst, right thigh: Secondary | ICD-10-CM | POA: Diagnosis not present

## 2016-03-13 DIAGNOSIS — Z79899 Other long term (current) drug therapy: Secondary | ICD-10-CM | POA: Diagnosis not present

## 2016-03-13 DIAGNOSIS — I4891 Unspecified atrial fibrillation: Secondary | ICD-10-CM | POA: Diagnosis not present

## 2016-03-13 DIAGNOSIS — I729 Aneurysm of unspecified site: Secondary | ICD-10-CM

## 2016-03-13 DIAGNOSIS — Z7901 Long term (current) use of anticoagulants: Secondary | ICD-10-CM | POA: Diagnosis not present

## 2016-03-13 DIAGNOSIS — I724 Aneurysm of artery of lower extremity: Secondary | ICD-10-CM | POA: Insufficient documentation

## 2016-03-13 HISTORY — PX: PERIPHERAL VASCULAR CATHETERIZATION: SHX172C

## 2016-03-13 LAB — POCT I-STAT, CHEM 8
BUN: 20 mg/dL (ref 6–20)
CHLORIDE: 106 mmol/L (ref 101–111)
CREATININE: 1.1 mg/dL (ref 0.61–1.24)
Calcium, Ion: 1.16 mmol/L (ref 1.15–1.40)
Glucose, Bld: 116 mg/dL — ABNORMAL HIGH (ref 65–99)
HEMATOCRIT: 44 % (ref 39.0–52.0)
Hemoglobin: 15 g/dL (ref 13.0–17.0)
Potassium: 4.5 mmol/L (ref 3.5–5.1)
SODIUM: 140 mmol/L (ref 135–145)
TCO2: 25 mmol/L (ref 0–100)

## 2016-03-13 SURGERY — A/V FISTULAGRAM

## 2016-03-13 MED ORDER — FENTANYL CITRATE (PF) 100 MCG/2ML IJ SOLN
INTRAMUSCULAR | Status: AC
  Filled 2016-03-13: qty 2

## 2016-03-13 MED ORDER — PHENOL 1.4 % MT LIQD: 1.0000 | OROMUCOSAL | Status: DC | PRN

## 2016-03-13 MED ORDER — MIDAZOLAM HCL 2 MG/2ML IJ SOLN
INTRAMUSCULAR | Status: AC
  Filled 2016-03-13: qty 2

## 2016-03-13 MED ORDER — LABETALOL HCL 5 MG/ML IV SOLN: 10.0000 mg | INTRAVENOUS | Status: DC | PRN

## 2016-03-13 MED ORDER — LIDOCAINE HCL (PF) 1 % IJ SOLN
INTRAMUSCULAR | Status: AC
  Filled 2016-03-13: qty 30

## 2016-03-13 MED ORDER — ACETAMINOPHEN 325 MG PO TABS: 325.0000 mg | ORAL_TABLET | ORAL | Status: DC | PRN

## 2016-03-13 MED ORDER — DOCUSATE SODIUM 100 MG PO CAPS: 100.0000 mg | ORAL_CAPSULE | Freq: Every day | ORAL | Status: DC

## 2016-03-13 MED ORDER — METOPROLOL TARTRATE 5 MG/5ML IV SOLN: 2.0000 mg | INTRAVENOUS | Status: DC | PRN

## 2016-03-13 MED ORDER — THROMBIN 20000 UNITS EX KIT: 20000.0000 [IU] | PACK | Freq: Once | CUTANEOUS | Status: DC

## 2016-03-13 MED ORDER — MIDAZOLAM HCL 2 MG/2ML IJ SOLN
INTRAMUSCULAR | Status: DC | PRN
  Administered 2016-03-13: 2 mg via INTRAVENOUS
  Administered 2016-03-13: 1 mg via INTRAVENOUS
  Administered 2016-03-13: 2 mg via INTRAVENOUS

## 2016-03-13 MED ORDER — FENTANYL CITRATE (PF) 100 MCG/2ML IJ SOLN
INTRAMUSCULAR | Status: DC | PRN
  Administered 2016-03-13 (×2): 50 ug via INTRAVENOUS
  Administered 2016-03-13: 25 ug via INTRAVENOUS

## 2016-03-13 MED ORDER — ALUM & MAG HYDROXIDE-SIMETH 200-200-20 MG/5ML PO SUSP: 15.0000 mL | ORAL | Status: DC | PRN

## 2016-03-13 MED ORDER — ONDANSETRON HCL 4 MG/2ML IJ SOLN: 4.0000 mg | Freq: Four times a day (QID) | INTRAMUSCULAR | Status: DC | PRN

## 2016-03-13 MED ORDER — HYDRALAZINE HCL 20 MG/ML IJ SOLN: 5.0000 mg | INTRAMUSCULAR | Status: DC | PRN

## 2016-03-13 MED ORDER — ACETAMINOPHEN 325 MG RE SUPP: 325.0000 mg | RECTAL | Status: DC | PRN

## 2016-03-13 MED ORDER — THROMBIN 5000 UNITS EX SOLR
5000.0000 [IU] | Freq: Once | CUTANEOUS | Status: DC
  Filled 2016-03-13: qty 5000

## 2016-03-13 MED ORDER — GUAIFENESIN-DM 100-10 MG/5ML PO SYRP: 15.0000 mL | ORAL_SOLUTION | ORAL | Status: DC | PRN

## 2016-03-13 MED ORDER — SODIUM CHLORIDE 0.9 % IV SOLN
INTRAVENOUS | Status: DC
  Administered 2016-03-13: 10:00:00 via INTRAVENOUS

## 2016-03-13 SURGICAL SUPPLY — 2 items
COVER PRB 48X5XTLSCP FOLD TPE (BAG) ×1 IMPLANT
COVER PROBE 5X48 (BAG) ×1

## 2016-03-13 NOTE — Progress Notes (Signed)
*  PRELIMINARY RESULTS* Vascular Ultrasound Ultrasound guided thrombin injection of right common femoral artery pseudoaneurysm Thrombin injection with five minutes of manual compression post compression was completed. After the procedure there is a small amount of residual flow in the pseudoaneurysm near the neck, which was also patent. The right common femoral artery and right distal posterior tibial artery are patent with triphasic flow post procedure.  03/13/2016 1:43 PM Maudry Mayhew, BS, RVT, RDCS, RDMS

## 2016-03-13 NOTE — Discharge Instructions (Signed)
Angiogram, Care After Refer to this sheet in the next few weeks. These instructions provide you with information about caring for yourself after your procedure. Your health care provider may also give you more specific instructions. Your treatment has been planned according to current medical practices, but problems sometimes occur. Call your health care provider if you have any problems or questions after your procedure. What can I expect after the procedure? After your procedure, it is typical to have the following:  Bruising at the catheter insertion site that usually fades within 1-2 weeks.  Blood collecting in the tissue (hematoma) that may be painful to the touch. It should usually decrease in size and tenderness within 1-2 weeks. Follow these instructions at home:  Take medicines only as directed by your health care provider.  You may shower 24-48 hours after the procedure or as directed by your health care provider. Remove the bandage (dressing) and gently wash the site with plain soap and water. Pat the area dry with a clean towel. Do not rub the site, because this may cause bleeding.  Do not take baths, swim, or use a hot tub until your health care provider approves.  Check your insertion site every day for redness, swelling, or drainage.  Do not apply powder or lotion to the site.  Do not lift over 10 lb (4.5 kg) for 5 days after your procedure or as directed by your health care provider.  Ask your health care provider when it is okay to:  Return to work or school.  Resume usual physical activities or sports.  Resume sexual activity.  Do not drive home if you are discharged the same day as the procedure. Have someone else drive you.  You may drive 24 hours after the procedure unless otherwise instructed by your health care provider.  Do not operate machinery or power tools for 24 hours after the procedure or as directed by your health care provider.  If your procedure  was done as an outpatient procedure, which means that you went home the same day as your procedure, a responsible adult should be with you for the first 24 hours after you arrive home.  Keep all follow-up visits as directed by your health care provider. This is important. Contact a health care provider if:  You have a fever.  You have chills.  You have increased bleeding from the catheter insertion site. Hold pressure on the site. Get help right away if:  You have unusual pain at the catheter insertion site.  You have redness, warmth, or swelling at the catheter insertion site.  You have drainage (other than a small amount of blood on the dressing) from the catheter insertion site.  The catheter insertion site is bleeding, and the bleeding does not stop after 30 minutes of holding steady pressure on the site.  The area near or just beyond the catheter insertion site becomes pale, cool, tingly, or numb. This information is not intended to replace advice given to you by your health care provider. Make sure you discuss any questions you have with your health care provider. Document Released: 10/09/2004 Document Revised: 08/29/2015 Document Reviewed: 08/24/2012 Elsevier Interactive Patient Education  2017 [employer] Pseudoaneurysm An aneurysm is a bulge in an artery. A pseudoaneurysm happens when an artery is injured and blood leaks out to form a sac-like bulge. The bulge can break open, causing bleeding in the nearby tissues. What are the causes? The most common cause of this condition is a  procedure such as an angiogram in which a thin tube (catheter) is inserted into an artery. After an angiogram, the insertion site on the artery should close back up all the way. If it does not, blood may leak out of the artery. Other causes of a pseudoaneurysminclude:  Trauma to the walls of an artery, such as from a stabbing injury or a deep cut.  Bypass artery grafting surgery, which is a type  of surgery that makes blood flow to the heart better.  An infection that affects the walls of an artery.  A heart attack (myocardial infarction).  An aneurysm that bursts (ruptures). What are the signs or symptoms? Symptoms of this condition include:  Pain, soreness, or tenderness at the site of the pseudoaneurysm.  Swelling.  Bruising or a change in skin color.  A throbbing mass or lump at the site. How is this diagnosed? This condition may be diagnosed based on your symptoms, a physical exam, and an imaging test called a Doppler ultrasound. This imaging test uses sound waves to show the blood flow in the arteries and the pseudoaneurysm. How is this treated? This condition may go away on its own without treatment. To help prevent bleeding that cannot be controlled, or to help prevent other problems, your health care provider may suggest one of these treatments:  Injecting a blood-clotting enzyme, such as thrombin, into the site.  Fixing the artery with surgery.  Putting pressure (compression) on the pseudoaneurysm. Follow these instructions at home:  Take over-the-counter and prescription medicines only as told by your health care provider.  Return to your normal activities as told by your health care provider. Ask your health care provider what activities are safe for you.  Keep all follow-up visits as told by your health care provider. This is important. Contact a health care provider if:  Your pain, soreness, or tenderness at the pseudoaneurysm site keeps getting worse.  You have swelling at the site. Get help right away if:  You have severe or ongoing (persistent) pain at the site of the pseudoaneurysm.  There is bleeding or drainage from the site.  The part of your body where the pseudoaneurysm is located changes color or becomes painful, cold, or numb.  You have chest pain or shortness of breath.  You feel like you might faint or you faint. This information is  not intended to replace advice given to you by your health care provider. Make sure you discuss any questions you have with your health care provider. Document Released: 09/09/2007 Document Revised: 01/03/2016 Document Reviewed: 01/03/2016 Elsevier Interactive Patient Education  2017 [employer].

## 2016-03-13 NOTE — Progress Notes (Signed)
Report received/ care accepted.  

## 2016-03-13 NOTE — H&P (Signed)
   Patient name: Austin Woods MRN: [id number] DOB: 13-Oct-1965 Sex:   HPI: Austin Woods is a 50 y.o. male s/p AFIB ablation and PCI.  He has a right groin pseudo that has not resolved.  HE is here for thrombin injection  Current Facility-Administered Medications  Medication Dose Route Frequency Provider Last Rate Last Dose  . 0.9 %  sodium chloride infusion   Intravenous Continuous Serafina Mitchell, MD 100 mL/hr at 03/13/16 1022      REVIEW OF SYSTEMS:  [X]  denotes positive finding, [ ]  denotes negative finding Cardiac  Comments:  Chest pain or chest pressure:    Shortness of breath upon exertion:    Short of breath when lying flat:    Irregular heart rhythm:    Constitutional    Fever or chills:      PHYSICAL EXAM: Vitals:   03/13/16 0956  BP: (!) 136/91  Pulse: 86  Resp: 18  Temp: 97.7 F (36.5 C)  TempSrc: Oral  SpO2: 98%  Weight: 256 lb (116.1 kg)  Height: 6\' 1"  (1.854 m)    GENERAL: The patient is a well-nourished male, in no acute distress. The vital signs are documented above. CARDIOVASCULAR: There is a regular rate and rhythm. PULMONARY: There is good air exchange bilaterally without wheezing or rales. Ecchymosis right groin with tenderness  MEDICAL ISSUES: Injection of right CFA pseudo.  Discussed risks of procedure including possible arterial thrombosis and need for emergent surgery.  Annamarie Major, MD [facility] [phone] Pager [phone]

## 2016-03-14 NOTE — Op Note (Signed)
    Patient name: Austin Woods MRN: [id number] DOB: Mar 01, 1966 Sex: male  03/13/2016 Pre-operative Diagnosis: right groin pseudoaneurysm Post-operative diagnosis:  Same Surgeon:  Annamarie Major Procedure Performed:  1.  Conscious sedation (15 min)  2.  Ultrasound guided injection of thrombin into pseudo aneurysm     Indications:  Following recent canulation of the right CFA, the patient developed a femoral pseudoaneurysm.  He has failed attempts at compression and has significant pain.  He remains on anticoagulation for his AFIB ablation as well as PCI.  Procedure:  The patient was identified in the holding area and taken to room 8.  The patient was then placed supine on the table and prepped and draped in the usual sterile fashion.  A time out was called.  Conscious sedation was performed with IV fentanyl and versed under continuous MD and nurse monitoring.  HR, BP, and O2 sats were monitoredUltrasound was used to evaluate the right common femoral artery.  U/S was used to identify the right femoral pseudoaneurysm.  It had active flow and a long neck.  Using u/s guidance, a micropuncture needle was advanced into the pseudoaneurysm.  A total of 5cc of thrombin was placed into the pseudo.  U/S evaluation revealed thrombosis of the pseudo after the procedure.  U/S guided compression was held for 5 additional minutes.  The patient tolerated the procedure without complications     Impression:  #1  Successful thrombosis of right femoral pseudoaneurysm     V. Annamarie Major, M.D. [facility] Office: [phone] Pager:  [phone]

## 2016-03-15 ENCOUNTER — Other Ambulatory Visit: Payer: Self-pay | Admitting: *Deleted

## 2016-03-15 DIAGNOSIS — I729 Aneurysm of unspecified site: Secondary | ICD-10-CM

## 2016-03-16 ENCOUNTER — Encounter (HOSPITAL_COMMUNITY): Payer: Self-pay | Admitting: Surgery

## 2016-03-16 MED FILL — Lidocaine HCl Local Preservative Free (PF) Inj 1%: INTRAMUSCULAR | Qty: 30 | Status: AC

## 2016-03-16 NOTE — Telephone Encounter (Signed)
New work note was faxed after procedure on 12/8 to  repair artery of right groin.  He will need to be excused from work for the next 2 weeks.  Return to work will need to be determined at patients next follow up appointment on March 27, 2017.

## 2016-03-17 ENCOUNTER — Ambulatory Visit (HOSPITAL_COMMUNITY)
Admission: RE | Admit: 2016-03-17 | Discharge: 2016-03-17 | Disposition: A | Discharge status: Home / Self Care | Payer: BLUE CROSS/BLUE SHIELD | Source: Ambulatory Visit | Attending: Vascular Surgery | Admitting: Vascular Surgery

## 2016-03-17 DIAGNOSIS — I729 Aneurysm of unspecified site: Secondary | ICD-10-CM

## 2016-03-18 ENCOUNTER — Ambulatory Visit (HOSPITAL_COMMUNITY): Payer: Self-pay | Admitting: Nurse Practitioner

## 2016-03-20 ENCOUNTER — Ambulatory Visit (INDEPENDENT_AMBULATORY_CARE_PROVIDER_SITE_OTHER): Payer: BLUE CROSS/BLUE SHIELD | Admitting: *Deleted

## 2016-03-20 DIAGNOSIS — I480 Paroxysmal atrial fibrillation: Secondary | ICD-10-CM | POA: Diagnosis not present

## 2016-03-23 ENCOUNTER — Other Ambulatory Visit: Payer: Self-pay | Admitting: Family Medicine

## 2016-03-23 NOTE — Progress Notes (Signed)
Carelink Summary Report / Loop Recorder 

## 2016-03-25 ENCOUNTER — Encounter: Payer: Self-pay | Admitting: Cardiovascular Disease

## 2016-03-25 ENCOUNTER — Ambulatory Visit (INDEPENDENT_AMBULATORY_CARE_PROVIDER_SITE_OTHER): Payer: BLUE CROSS/BLUE SHIELD | Admitting: Cardiovascular Disease

## 2016-03-25 VITALS — BP 134/74 | HR 84 | Ht 73.0 in | Wt 264.0 lb

## 2016-03-25 DIAGNOSIS — E7800 Pure hypercholesterolemia, unspecified: Secondary | ICD-10-CM

## 2016-03-25 DIAGNOSIS — Z955 Presence of coronary angioplasty implant and graft: Secondary | ICD-10-CM | POA: Diagnosis not present

## 2016-03-25 DIAGNOSIS — I10 Essential (primary) hypertension: Secondary | ICD-10-CM

## 2016-03-25 DIAGNOSIS — R002 Palpitations: Secondary | ICD-10-CM

## 2016-03-25 DIAGNOSIS — I4819 Other persistent atrial fibrillation: Secondary | ICD-10-CM

## 2016-03-25 DIAGNOSIS — I481 Persistent atrial fibrillation: Secondary | ICD-10-CM | POA: Diagnosis not present

## 2016-03-25 DIAGNOSIS — I5022 Chronic systolic (congestive) heart failure: Secondary | ICD-10-CM

## 2016-03-25 MED ORDER — METOPROLOL SUCCINATE ER 100 MG PO TB24: 100.0000 mg | ORAL_TABLET | Freq: Two times a day (BID) | ORAL | 3 refills | Status: DC

## 2016-03-25 NOTE — Progress Notes (Signed)
SUBJECTIVE: The patient is a 50 year old male whom I last saw in 04/2013.  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 He failed multiple antiarrhythmics which included flecainide and dofetilide. He ultimately underwent radiofrequency ablation for atrial fibrillation in December 2014 by Dr. Rayann Heman. He also has a h/o WPW ablated in 2001, along with HTN and hyperlipidemia.  He underwent repeat a fib ablation 02/18/16 and developed a pseudoaneurysm which was injected with thrombin by vascular surgery.  He also has CAD and underwent PCI to prox/mid LAD on 02/03/16.  Echo 01/31/16: Mildly reduce LV systolic function, EF [id number], with apical hypokinesis.  [place] he is back in a fib. Has palpitations. No exertional chest pain. Taking Toprol-XL 75 mg bid. Scheduled to see a fib clinic this Friday.    Review of Systems: As per "subjective", otherwise negative.  Allergies  Allergen Reactions  . Pollen Extract     Runny nose Watery eyes Nasal congestion    Current Outpatient Prescriptions  Medication Sig Dispense Refill  . acetaminophen (TYLENOL) 500 MG tablet Take 1,000 mg by mouth every 6 (six) hours as needed for headache.    . albuterol (PROVENTIL HFA) 108 (90 Base) MCG/ACT inhaler Inhale 2 puffs into the lungs every 4 (four) hours as needed for wheezing or shortness of breath. 1 Inhaler 0  . atorvastatin (LIPITOR) 10 MG tablet Take 1 tablet (10 mg total) by mouth daily. 30 tablet 0  . Cholecalciferol (VITAMIN D) 2000 UNITS CAPS Take 2 capsules by mouth daily.     . clopidogrel (PLAVIX) 75 MG tablet Take 1 tablet (75 mg total) by mouth daily with breakfast. 90 tablet 3  . enalapril (VASOTEC) 10 MG tablet Take 1 tablet (10 mg total) by mouth daily. 90 tablet 3  . Fluticasone-Salmeterol (ADVAIR DISKUS) 250-50 MCG/DOSE AEPB Inhale 1 puff into the lungs every 12 (twelve) hours. 60 each 0  .  furosemide (LASIX) 40 MG tablet TAKE 1 TABLET BY MOUTH  DAILY 30 tablet 0  . ketoconazole (NIZORAL) 2 % cream Apply 1 application topically 2 (two) times daily. (Patient taking differently: Apply 1 application topically daily as needed for irritation. ) 30 g 4  . magnesium 30 MG tablet Take 30 mg by mouth 2 (two) times daily.    . metoprolol succinate (TOPROL-XL) 50 MG 24 hr tablet Take 2 tablets (100mg ) by mouth twice a day 180 tablet 3  . nitroGLYCERIN (NITROSTAT) 0.4 MG SL tablet Place 1 tablet (0.4 mg total) under the tongue every 5 (five) minutes x 3 doses as needed for chest pain. 75 tablet 1  . oxybutynin (DITROPAN) 5 MG tablet Take 0.5 tablets (2.5 mg total) by mouth 2 (two) times daily as needed for bladder spasms. 45 tablet 3  . pantoprazole (PROTONIX) 40 MG tablet Take 1 tablet (40 mg total) by mouth daily. 90 tablet 3  . Potassium Chloride ER 20 MEQ TBCR Take 1 tablet by mouth daily. 90 tablet 1  . PROCTOZONE-HC 2.5 % rectal cream Apply 3 times daily (Patient taking differently: Apply rectally as needed for hemorrhoids) 90 g 3  . rivaroxaban (XARELTO) 20 MG TABS tablet Take 1 tablet (20 mg total) by mouth daily with supper. 90 tablet 3  . tadalafil (CIALIS) 5 MG tablet Take 1 tablet (5 mg total) by mouth daily as needed for erectile dysfunction. 10 tablet 0  . triamcinolone cream (KENALOG) 0.1 % Apply 1 application topically  2 (two) times daily. Prn rash; use up to 2 weeks 30 g 0   No current facility-administered medications for this visit.     Past Medical History:  Diagnosis Date  . Asthma   . GERD (gastroesophageal reflux disease)   . Headache   . High cholesterol   . History of DVT (deep vein thrombosis)    a. left leg following ablation for SVT  . Hypertension   . Persistent atrial fibrillation ([place])    a. Dx 08/2012, xarelto initiated; b. 09/2012 Tikosyn initiated -> DCCV -> Sinus c. PVI 03/2013 d. PVI 03/2014  . Sleep apnea    does not use CPAP (he reports having  uvulectomy)   . Wolff-Parkinson-White (WPW) syndrome    a. s/p RFCA 2001 by Dr Caryl Comes, pt reports complicated by post procedure DVT    Past Surgical History:  Procedure Laterality Date  . ATRIAL FIBRILLATION ABLATION  03/23/2013   PVI by DR Rayann Heman for afib  . ATRIAL FIBRILLATION ABLATION N/A 03/23/2013   Procedure: ATRIAL FIBRILLATION ABLATION;  Surgeon: Coralyn Mark, MD;  Location: [place] CATH LAB;  Service: Cardiovascular;  Laterality: N/A;  . ATRIAL FIBRILLATION ABLATION N/A 03/27/2014   PVI Dr Rayann Heman  . BACK SURGERY    . CARDIAC CATHETERIZATION N/A 02/03/2016   Procedure: Left Heart Cath and Coronary Angiography;  Surgeon: Nelva Bush, MD;  Location: [place] CV LAB;  Service: Cardiovascular;  Laterality: N/A;  . CARDIAC CATHETERIZATION N/A 02/03/2016   Procedure: Intravascular Pressure Wire/FFR Study;  Surgeon: Nelva Bush, MD;  Location: [place] CV LAB;  Service: Cardiovascular;  Laterality: N/A;  . CARDIAC CATHETERIZATION N/A 02/03/2016   Procedure: Coronary Stent Intervention;  Surgeon: Nelva Bush, MD;  Location: [place] CV LAB;  Service: Cardiovascular;  Laterality: N/A;  . CARDIAC SURGERY    . CARDIOVERSION N/A 08/25/2012   Procedure: CARDIOVERSION;  Surgeon: Thayer Headings, MD;  Location: [facility] ENDOSCOPY;  Service: Cardiovascular;  Laterality: N/A;  . CARDIOVERSION N/A 09/06/2012   Procedure: CARDIOVERSION/Bedside;  Surgeon: Carlena Bjornstad, MD;  Location: [place];  Service: Cardiovascular;  Laterality: N/A;  . CARDIOVERSION N/A 01/12/2014   Procedure: CARDIOVERSION;  Surgeon: Pixie Casino, MD;  Location: [facility] ENDOSCOPY;  Service: Cardiovascular;  Laterality: N/A;  . CARDIOVERSION N/A 01/30/2016   Procedure: CARDIOVERSION;  Surgeon: Sueanne Margarita, MD;  Location: [facility] ENDOSCOPY;  Service: Cardiovascular;  Laterality: N/A;  . COLONOSCOPY N/A 12/04/2015   Procedure: COLONOSCOPY;  Surgeon: Rogene Houston, MD;  [facility];  Service: Endoscopy;   Laterality: N/A;  730  . ELECTROPHYSIOLOGIC STUDY N/A 02/18/2016   Procedure: Atrial Fibrillation Ablation;  Surgeon: Thompson Grayer, MD;  Location: [place] CV LAB;  Service: Cardiovascular;  Laterality: N/A;  . LOOP RECORDER IMPLANT N/A 06/29/2014   Procedure: LOOP RECORDER IMPLANT;  Surgeon: Thompson Grayer, MD;  Location: [facility] CATH LAB;  Service: Cardiovascular;  Laterality: N/A;  . NOSE SURGERY     sleep apnea surgery  . PERIPHERAL VASCULAR CATHETERIZATION N/A 03/13/2016   Procedure: Thrombin Injection;  Surgeon: Serafina Mitchell, MD;  Location: [place] CV LAB;  Service: Cardiovascular;  Laterality: N/A;  . STOMACH SURGERY     reflux, fundoplication  . TEE WITHOUT CARDIOVERSION N/A 08/25/2012   Procedure: TRANSESOPHAGEAL ECHOCARDIOGRAM (TEE);  Surgeon: Thayer Headings, MD;  Location: [place];  Service: Cardiovascular;  Laterality: N/A;  . TEE WITHOUT CARDIOVERSION  03/23/2013   DR ROSS  . TEE WITHOUT CARDIOVERSION N/A 03/23/2013   Procedure:  TRANSESOPHAGEAL ECHOCARDIOGRAM (TEE);  Surgeon: Fay Records, MD;  Location[facility] ENDOSCOPY;  Service: Cardiovascular;  Laterality: N/A;  . TEE WITHOUT CARDIOVERSION N/A 03/26/2014   Procedure: TRANSESOPHAGEAL ECHOCARDIOGRAM (TEE);  Surgeon: Josue Hector, MD;  Location: [facility] ENDOSCOPY;  Service: Cardiovascular;  Laterality: N/A;  . TONSILLECTOMY      Social History   Social History  . Marital status: Married    Spouse name: N/A  . Number of children: 2  . Years of education: N/A   Occupational History  .  [employer]   Social History Main Topics  . Smoking status: Former Smoker    Packs/day: 1.00    Years: 7.00    Types: Cigarettes    Start date: 05/19/2003    Quit date: 08/06/2010  . Smokeless tobacco: Never Used  . Alcohol use No  . Drug use: No  . Sexual activity: Not on file   Other Topics Concern  . Not on file   Social History Narrative   Pt lives in [place] with wife.  Works at [employer]:   03/25/16 1350  BP:  134/74  Pulse: 84  SpO2: 98%  Weight: 264 lb (119.7 kg)  Height: 6\' 1"  (1.854 m)    PHYSICAL EXAM General: NAD HEENT: Normal. Neck: No JVD, no thyromegaly. Lungs: Clear to auscultation bilaterally with normal respiratory effort. CV: Nondisplaced PMI.  Regular rate and irregular rhythm, normal S1/S2, no S3, no murmur. No pretibial or periankle edema.  Abdomen: Soft, obese.  Neurologic: Alert and oriented.  Psych: Normal affect. Skin: Normal. Musculoskeletal: No gross deformities.    ECG: Most recent ECG reviewed.    ASSESSMENT AND PLAN: 1. Atrial fibrillation s/p ablation with recurrence and palpitations: Will increase Toprol-XL to 100 mg bid and continue Xarelto. Due to see a fib clinic this Friday.   2. Cardiomyopathy/chronic systolic heart failure: Euvolemic on Lasix at present dose. Continue Toprol-XL and enalapril.  3. HTN: Controlled. Monitor given increase in metoprolol dose.  4. Hyperlipidemia: continue Lipitor 10 mg daily.  5. CAD with LAD stent: Symptomatically stable. Continue beta blocker, Plavix, and statin. No ASA as he is on Plavix and Xarelto.  Dispo: f/u 6 months.  Time spent: 40 minutes, of which greater than 50% was spent reviewing symptoms, relevant blood tests and studies, and discussing management plan with the patient.   Kate Sable, M.D., F.A.C.C.

## 2016-03-25 NOTE — Patient Instructions (Signed)
Medication Instructions:  INCREASE TOPROL XL TO 100 MG - TWO TIMES DAILY   Labwork: NONE  Testing/Procedures: NONE  Follow-Up: Your physician wants you to follow-up in: 6 MONTHS . You will receive a reminder letter in the mail two months in advance. If you don't receive a letter, please call our office to schedule the follow-up appointment.   Any Other Special Instructions Will Be Listed Below (If Applicable).     If you need a refill on your cardiac medications before your next appointment, please call your pharmacy.

## 2016-03-26 ENCOUNTER — Other Ambulatory Visit: Payer: Self-pay | Admitting: *Deleted

## 2016-03-26 DIAGNOSIS — I729 Aneurysm of unspecified site: Secondary | ICD-10-CM

## 2016-03-27 ENCOUNTER — Encounter (HOSPITAL_COMMUNITY): Payer: Self-pay | Admitting: Nurse Practitioner

## 2016-03-27 ENCOUNTER — Ambulatory Visit (HOSPITAL_COMMUNITY)
Admission: RE | Admit: 2016-03-27 | Discharge: 2016-03-27 | Disposition: A | Discharge status: Home / Self Care | Payer: BLUE CROSS/BLUE SHIELD | Source: Ambulatory Visit | Attending: Nurse Practitioner | Admitting: Nurse Practitioner

## 2016-03-27 VITALS — BP 138/84 | HR 115 | Ht 73.0 in | Wt 264.8 lb

## 2016-03-27 DIAGNOSIS — Z7901 Long term (current) use of anticoagulants: Secondary | ICD-10-CM | POA: Diagnosis not present

## 2016-03-27 DIAGNOSIS — I2510 Atherosclerotic heart disease of native coronary artery without angina pectoris: Secondary | ICD-10-CM | POA: Diagnosis not present

## 2016-03-27 DIAGNOSIS — Z87891 Personal history of nicotine dependence: Secondary | ICD-10-CM | POA: Diagnosis not present

## 2016-03-27 DIAGNOSIS — Z79899 Other long term (current) drug therapy: Secondary | ICD-10-CM | POA: Insufficient documentation

## 2016-03-27 DIAGNOSIS — I481 Persistent atrial fibrillation: Secondary | ICD-10-CM | POA: Insufficient documentation

## 2016-03-27 DIAGNOSIS — I4819 Other persistent atrial fibrillation: Secondary | ICD-10-CM

## 2016-03-27 DIAGNOSIS — Z9889 Other specified postprocedural states: Secondary | ICD-10-CM | POA: Insufficient documentation

## 2016-03-27 LAB — BASIC METABOLIC PANEL
Anion gap: 6 (ref 5–15)
BUN: 16 mg/dL (ref 6–20)
CALCIUM: 9.5 mg/dL (ref 8.9–10.3)
CHLORIDE: 108 mmol/L (ref 101–111)
CO2: 25 mmol/L (ref 22–32)
CREATININE: 1.16 mg/dL (ref 0.61–1.24)
Glucose, Bld: 116 mg/dL — ABNORMAL HIGH (ref 65–99)
Potassium: 4.5 mmol/L (ref 3.5–5.1)
SODIUM: 139 mmol/L (ref 135–145)

## 2016-03-27 LAB — CBC
HCT: 46.3 % (ref 39.0–52.0)
HEMOGLOBIN: 15.6 g/dL (ref 13.0–17.0)
MCH: 31.6 pg (ref 26.0–34.0)
MCHC: 33.7 g/dL (ref 30.0–36.0)
MCV: 93.9 fL (ref 78.0–100.0)
PLATELETS: 348 10*3/uL (ref 150–400)
RBC: 4.93 MIL/uL (ref 4.22–5.81)
RDW: 13.6 % (ref 11.5–15.5)
WBC: 10 10*3/uL (ref 4.0–10.5)

## 2016-03-27 NOTE — Progress Notes (Signed)
Primary Care Physician: Mickie Hillier, MD EP Physician: Dr. Rayann Heman Cardiologist: Dr. Cecile Sheerer is a 50 y.o. male with a h/o afib with third ablation performed 02/18/16. He had cardioversion 10/26, and c/o of chest pain after the procedure. He was admitted and underwent cardiac catherization 02/03/16 and received a stent of the LAD. He has not had f/u with general cardiology, but was told an appointment with Dr. Bronson Ing would be pending.  He was suppose to go back to work yesterday but called to be seen due to continued pain of rt groin subsequent to hematoma at time of catherization and feeling that the pain at the groin would keep him from doing his job requiring  him to stand on his feet and do heavy lifting.  He is also in rapid afib at 138 bpm, but he was unaware of this. He has not been checking his HR/BP at home and does not know if the afib is persistent.  The continued groin discomfort is more of a concern to him right now.  F/u 12/22, since being seen in the clinic, he underwent treatment of pseudoaneurysm of rt groin which is still not resolved and will f/u with Dr. Trula Slade on Wednesday. He continues in afib, states rate controlled at home, but will need cardioversion, s/p ablation. Will await to see what procedure may be involved after the appointment on Wednesday and then will schedule. He has seen Dr. Bronson Ing for his CAD. He is out of work for the ongoing groin issues and will have to be released from the groin standpoint after seeing vascular MD. He has not missed any blood thinners.  Today, he denies symptoms of palpitations, chest pain, shortness of breath, orthopnea, PND, lower extremity edema, dizziness, presyncope, syncope, or neurologic sequela. Ongoing rt groin discomfort. The patient is tolerating medications without difficulties and is otherwise without complaint today.   Past Medical History:  Diagnosis Date  . Asthma   . GERD (gastroesophageal  reflux disease)   . Headache   . High cholesterol   . History of DVT (deep vein thrombosis)    a. left leg following ablation for SVT  . Hypertension   . Persistent atrial fibrillation ([place])    a. Dx 08/2012, xarelto initiated; b. 09/2012 Tikosyn initiated -> DCCV -> Sinus c. PVI 03/2013 d. PVI 03/2014  . Sleep apnea    does not use CPAP (he reports having uvulectomy)   . Wolff-Parkinson-White (WPW) syndrome    a. s/p RFCA 2001 by Dr Caryl Comes, pt reports complicated by post procedure DVT   Past Surgical History:  Procedure Laterality Date  . ATRIAL FIBRILLATION ABLATION  03/23/2013   PVI by DR Rayann Heman for afib  . ATRIAL FIBRILLATION ABLATION N/A 03/23/2013   Procedure: ATRIAL FIBRILLATION ABLATION;  Surgeon: Coralyn Mark, MD;  Location: [place] CATH LAB;  Service: Cardiovascular;  Laterality: N/A;  . ATRIAL FIBRILLATION ABLATION N/A 03/27/2014   PVI Dr Rayann Heman  . BACK SURGERY    . CARDIAC CATHETERIZATION N/A 02/03/2016   Procedure: Left Heart Cath and Coronary Angiography;  Surgeon: Nelva Bush, MD;  Location: [place] CV LAB;  Service: Cardiovascular;  Laterality: N/A;  . CARDIAC CATHETERIZATION N/A 02/03/2016   Procedure: Intravascular Pressure Wire/FFR Study;  Surgeon: Nelva Bush, MD;  Location: [place] CV LAB;  Service: Cardiovascular;  Laterality: N/A;  . CARDIAC CATHETERIZATION N/A 02/03/2016   Procedure: Coronary Stent Intervention;  Surgeon: Nelva Bush, MD;  Location: [facility] INVASIVE CV  LAB;  Service: Cardiovascular;  Laterality: N/A;  . CARDIAC SURGERY    . CARDIOVERSION N/A 08/25/2012   Procedure: CARDIOVERSION;  Surgeon: Thayer Headings, MD;  Location: [facility] ENDOSCOPY;  Service: Cardiovascular;  Laterality: N/A;  . CARDIOVERSION N/A 09/06/2012   Procedure: CARDIOVERSION/Bedside;  Surgeon: Carlena Bjornstad, MD;  Location: [place];  Service: Cardiovascular;  Laterality: N/A;  . CARDIOVERSION N/A 01/12/2014   Procedure: CARDIOVERSION;  Surgeon: Pixie Casino, MD;  Location:  [facility] ENDOSCOPY;  Service: Cardiovascular;  Laterality: N/A;  . CARDIOVERSION N/A 01/30/2016   Procedure: CARDIOVERSION;  Surgeon: Sueanne Margarita, MD;  Location: [facility] ENDOSCOPY;  Service: Cardiovascular;  Laterality: N/A;  . COLONOSCOPY N/A 12/04/2015   Procedure: COLONOSCOPY;  Surgeon: Rogene Houston, MD;  [facility];  Service: Endoscopy;  Laterality: N/A;  730  . ELECTROPHYSIOLOGIC STUDY N/A 02/18/2016   Procedure: Atrial Fibrillation Ablation;  Surgeon: Thompson Grayer, MD;  Location: [place] CV LAB;  Service: Cardiovascular;  Laterality: N/A;  . LOOP RECORDER IMPLANT N/A 06/29/2014   Procedure: LOOP RECORDER IMPLANT;  Surgeon: Thompson Grayer, MD;  Location: [facility] CATH LAB;  Service: Cardiovascular;  Laterality: N/A;  . NOSE SURGERY     sleep apnea surgery  . PERIPHERAL VASCULAR CATHETERIZATION N/A 03/13/2016   Procedure: Thrombin Injection;  Surgeon: Serafina Mitchell, MD;  Location: [place] CV LAB;  Service: Cardiovascular;  Laterality: N/A;  . STOMACH SURGERY     reflux, fundoplication  . TEE WITHOUT CARDIOVERSION N/A 08/25/2012   Procedure: TRANSESOPHAGEAL ECHOCARDIOGRAM (TEE);  Surgeon: Thayer Headings, MD;  Location: [place];  Service: Cardiovascular;  Laterality: N/A;  . TEE WITHOUT CARDIOVERSION  03/23/2013   DR ROSS  . TEE WITHOUT CARDIOVERSION N/A 03/23/2013   Procedure: TRANSESOPHAGEAL ECHOCARDIOGRAM (TEE);  Surgeon: Fay Records, MD;  Location: [facility] ENDOSCOPY;  Service: Cardiovascular;  Laterality: N/A;  . TEE WITHOUT CARDIOVERSION N/A 03/26/2014   Procedure: TRANSESOPHAGEAL ECHOCARDIOGRAM (TEE);  Surgeon: Josue Hector, MD;  Location: [facility] ENDOSCOPY;  Service: Cardiovascular;  Laterality: N/A;  . TONSILLECTOMY      Current Outpatient Prescriptions  Medication Sig Dispense Refill  . acetaminophen (TYLENOL) 500 MG tablet Take 1,000 mg by mouth every 6 (six) hours as needed for headache.    . albuterol (PROVENTIL HFA) 108 (90 Base) MCG/ACT inhaler Inhale 2 puffs into  the lungs every 4 (four) hours as needed for wheezing or shortness of breath. 1 Inhaler 0  . atorvastatin (LIPITOR) 10 MG tablet Take 1 tablet (10 mg total) by mouth daily. 30 tablet 0  . Cholecalciferol (VITAMIN D) 2000 UNITS CAPS Take 2 capsules by mouth daily.     . clopidogrel (PLAVIX) 75 MG tablet Take 1 tablet (75 mg total) by mouth daily with breakfast. 90 tablet 3  . enalapril (VASOTEC) 10 MG tablet Take 1 tablet (10 mg total) by mouth daily. 90 tablet 3  . Fluticasone-Salmeterol (ADVAIR DISKUS) 250-50 MCG/DOSE AEPB Inhale 1 puff into the lungs every 12 (twelve) hours. 60 each 0  . furosemide (LASIX) 40 MG tablet TAKE 1 TABLET BY MOUTH  DAILY 30 tablet 0  . ketoconazole (NIZORAL) 2 % cream Apply 1 application topically 2 (two) times daily. (Patient taking differently: Apply 1 application topically daily as needed for irritation. ) 30 g 4  . magnesium 30 MG tablet Take 30 mg by mouth 2 (two) times daily.    . metoprolol succinate (TOPROL-XL) 100 MG 24 hr tablet Take 1 tablet (100 mg total) by mouth 2 (  two) times daily. Take with or immediately following a meal. 180 tablet 3  . nitroGLYCERIN (NITROSTAT) 0.4 MG SL tablet Place 1 tablet (0.4 mg total) under the tongue every 5 (five) minutes x 3 doses as needed for chest pain. 75 tablet 1  . oxybutynin (DITROPAN) 5 MG tablet Take 0.5 tablets (2.5 mg total) by mouth 2 (two) times daily as needed for bladder spasms. 45 tablet 3  . pantoprazole (PROTONIX) 40 MG tablet Take 1 tablet (40 mg total) by mouth daily. 90 tablet 3  . Potassium Chloride ER 20 MEQ TBCR Take 1 tablet by mouth daily. 90 tablet 1  . PROCTOZONE-HC 2.5 % rectal cream Apply 3 times daily (Patient taking differently: Apply rectally as needed for hemorrhoids) 90 g 3  . rivaroxaban (XARELTO) 20 MG TABS tablet Take 1 tablet (20 mg total) by mouth daily with supper. 90 tablet 3  . tadalafil (CIALIS) 5 MG tablet Take 1 tablet (5 mg total) by mouth daily as needed for erectile dysfunction.  10 tablet 0  . triamcinolone cream (KENALOG) 0.1 % Apply 1 application topically 2 (two) times daily. Prn rash; use up to 2 weeks 30 g 0   No current facility-administered medications for this encounter.     Allergies  Allergen Reactions  . Pollen Extract     Runny nose Watery eyes Nasal congestion    Social History   Social History  . Marital status: Married    Spouse name: N/A  . Number of children: 2  . Years of education: N/A   Occupational History  .  [employer]   Social History Main Topics  . Smoking status: Former Smoker    Packs/day: 1.00    Years: 7.00    Types: Cigarettes    Start date: 05/19/2003    Quit date: 08/06/2010  . Smokeless tobacco: Never Used  . Alcohol use No  . Drug use: No  . Sexual activity: Not on file   Other Topics Concern  . Not on file   Social History Narrative   Pt lives in [place] with wife.  Works at [employer] History  Problem Relation Age of Onset  . CAD Father 36  . Diabetes Father   . Heart attack Father   . Transient ischemic attack Father   . Vascular Disease Father   . Diabetes Mother   . Anemia Mother   . Gout Mother   . Aneurysm Maternal Grandmother   . Stroke Maternal Grandfather   . Diabetes Paternal Grandmother   . Stroke Paternal Grandfather   . Diabetes Brother     ROS- All systems are reviewed and negative except as per the HPI above  Physical Exam: Vitals:   03/27/16 1101  BP: 138/84  Pulse: (!) 115  Weight: 264 lb 12.8 oz (120.1 kg)  Height: 6\' 1"  (1.854 m)    GEN- The patient is well appearing, alert and oriented x 3 today.   Head- normocephalic, atraumatic Eyes-  Sclera clear, conjunctiva pink Ears- hearing intact Oropharynx- clear Neck- supple, no JVP Lymph- no cervical lymphadenopathy Lungs- Clear to ausculation bilaterally, normal work of breathing Heart-  irregular rate and rhythm, no murmurs, rubs or gallops, PMI not laterally displaced GI- soft, NT, ND, + BS Extremities- no  clubbing, cyanosis, or edema.   MS- no significant deformity or atrophy Skin- no rash or lesion Psych- euthymic mood, full affect Neuro- strength and sensation are intact  EKG-afib with rvr at 115  bpm, qrs int 78 ms, qtc 445 ms Epic records reviewed  Assessment and Plan: 1.persisitent  Afib, s/p ablation Needs cardioversion and will be scheduled next week after hearing plans from Dr. Trula Slade CBC, bmet Will be placed out of work to next week when sees vascular, next work note will be from there if needed  No missed doses of blood thinner Continue metoprolol, rvr in office but states at home HR is usually less than 100  2. Rt groin pseudoaneurysm  F/u with Dr. Trula Slade Wednesday  3. CAD stable Stent placement of LAD 10/30 Continue plavix  Continue BB, statin  F/u with Dr. Rayann Heman 2/14 afib clinic as needed   Butch Penny C. Carroll, [facility] [address] [place], [place] [address] [phone]

## 2016-03-27 NOTE — Patient Instructions (Signed)
Call us once you talk with Dr. Trula Slade regarding ultrasound results.

## 2016-03-31 ENCOUNTER — Telehealth: Payer: Self-pay | Admitting: Cardiovascular Disease

## 2016-03-31 MED ORDER — METOPROLOL SUCCINATE ER 100 MG PO TB24: 100.0000 mg | ORAL_TABLET | Freq: Two times a day (BID) | ORAL | 3 refills | Status: DC

## 2016-03-31 NOTE — Telephone Encounter (Signed)
Refill done.  

## 2016-04-01 ENCOUNTER — Ambulatory Visit (HOSPITAL_COMMUNITY)
Admission: RE | Admit: 2016-04-01 | Discharge: 2016-04-01 | Disposition: A | Discharge status: Home / Self Care | Payer: BLUE CROSS/BLUE SHIELD | Source: Ambulatory Visit | Attending: Vascular Surgery | Admitting: Vascular Surgery

## 2016-04-01 ENCOUNTER — Other Ambulatory Visit: Payer: Self-pay | Admitting: *Deleted

## 2016-04-01 DIAGNOSIS — I729 Aneurysm of unspecified site: Secondary | ICD-10-CM | POA: Diagnosis not present

## 2016-04-01 MED ORDER — OXYBUTYNIN CHLORIDE 5 MG PO TABS: 2.5000 mg | ORAL_TABLET | Freq: Two times a day (BID) | ORAL | 3 refills | Status: DC | PRN

## 2016-04-02 LAB — CUP PACEART REMOTE DEVICE CHECK
Date Time Interrogation Session: 20171115190821
MDC IDC PG IMPLANT DT: 20160325

## 2016-04-03 ENCOUNTER — Telehealth: Payer: Self-pay | Admitting: *Deleted

## 2016-04-03 ENCOUNTER — Other Ambulatory Visit: Payer: Self-pay | Admitting: Surgery

## 2016-04-03 ENCOUNTER — Telehealth (HOSPITAL_COMMUNITY): Payer: Self-pay | Admitting: *Deleted

## 2016-04-03 ENCOUNTER — Other Ambulatory Visit: Payer: Self-pay | Admitting: *Deleted

## 2016-04-03 ENCOUNTER — Encounter: Payer: Self-pay | Admitting: *Deleted

## 2016-04-03 DIAGNOSIS — I728 Aneurysm of other specified arteries: Principal | ICD-10-CM

## 2016-04-03 DIAGNOSIS — T81719D Complication of unspecified artery following a procedure, not elsewhere classified, subsequent encounter: Principal | ICD-10-CM

## 2016-04-03 DIAGNOSIS — I729 Aneurysm of unspecified site: Secondary | ICD-10-CM

## 2016-04-03 DIAGNOSIS — IMO0001 Reserved for inherently not codable concepts without codable children: Secondary | ICD-10-CM

## 2016-04-03 DIAGNOSIS — T81718A Complication of other artery following a procedure, not elsewhere classified, initial encounter: Secondary | ICD-10-CM

## 2016-04-03 DIAGNOSIS — I724 Aneurysm of artery of lower extremity: Secondary | ICD-10-CM

## 2016-04-03 NOTE — Telephone Encounter (Signed)
Discussed with patient about returning to work, he has a job that requires constant lifting of weights greater than 5 lbs, along with bending and rotating his torso many times a day. According to Dr. Stephens Shire note, I have put him out of work until he is re-evaluated by Dr. Trula Slade on 05-13-16. Patient voiced understanding and agreement with this plan of care. He has contacted Dr. Jackalyn Lombard office regarding the cardioversion scheduling.

## 2016-04-03 NOTE — Telephone Encounter (Signed)
Patient called in stating he has not heard about his release to work date and needs a new note. Per Kerby Less last note on 12/22 -- return to work should be handled via Dr. Stephens Shire office since pt can return to work from [employer]. Pt will let us know when he knows from vascular standpoint the plan as we are waiting on this decision to pursue cardioversion. Pt verbalized understanding.

## 2016-04-03 NOTE — Telephone Encounter (Signed)
-----   Message from Serafina Mitchell, MD sent at 04/03/2016  4:07 PM EST ----- Regarding: RE: Pseudoaneurysm duplex I would prefer him not to return to work, but if he has to he can not do any heavy lifting over 5 lbs and no strenuous activity.  Also need to check with cardiology as he is primarily their patient ----- Message ----- From: Mena Goes, RN Sent: 04/03/2016   9:23 AM To: Serafina Mitchell, MD Subject: RE: Pseudoaneurysm duplex                      Patient wants to know when he is able to return to work? Let me know and I'll write the note, His cardioversion has still not been scheduled. Does this need to wait until he is scanned in a month?  ----- Message ----- From: Serafina Mitchell, MD Sent: 04/03/2016   7:53 AM To: Mena Goes, RN Subject: RE: Pseudoaneurysm duplex                      Lets have him come back again in 1 month with a repeat u/s and to see me   ----- Message ----- From: Mena Goes, RN Sent: 04/01/2016  11:46 AM To: Serafina Mitchell, MD Subject: Pseudoaneurysm duplex                          Patient came in today for follow up on 03-17-16 scan which showed residual pseudoaneurysm. Had Thrombin injection 03-13-16 right groin. Today's scan showed: Small area of residual pseudoaneurysm remains in the right groin. Both the hematoma and the pseudoaneurysm are smaller from previous exam on 03-17-16.   I told patient that I would call him today after I spoke to you. He needs cardioversion which hasn't been scheduled yet.

## 2016-04-07 ENCOUNTER — Telehealth (HOSPITAL_COMMUNITY): Payer: Self-pay | Admitting: *Deleted

## 2016-04-07 ENCOUNTER — Other Ambulatory Visit (HOSPITAL_COMMUNITY): Payer: Self-pay | Admitting: *Deleted

## 2016-04-07 NOTE — Telephone Encounter (Signed)
Pt called stated vascular surgeon has no plans other than repeat ultrasound in 1 month. Ok to proceed with cardioversion. Have set up for 1/9 @ 1pm. Pt will come in labs/ekg prior to cardioversion to confirm afib. Pt instructed NPO after MN, meds with sip of water and no missed doses of xarelto. Pt verbalized understanding and was in agreement.

## 2016-04-14 ENCOUNTER — Encounter (HOSPITAL_COMMUNITY): Payer: Self-pay | Admitting: *Deleted

## 2016-04-14 ENCOUNTER — Ambulatory Visit (HOSPITAL_BASED_OUTPATIENT_CLINIC_OR_DEPARTMENT_OTHER)
Admission: RE | Admit: 2016-04-14 | Discharge: 2016-04-14 | Disposition: A | Discharge status: Home / Self Care | Payer: BLUE CROSS/BLUE SHIELD | Source: Ambulatory Visit | Attending: Nurse Practitioner | Admitting: Nurse Practitioner

## 2016-04-14 ENCOUNTER — Ambulatory Visit (HOSPITAL_COMMUNITY): Payer: BLUE CROSS/BLUE SHIELD | Admitting: Anesthesiology

## 2016-04-14 ENCOUNTER — Ambulatory Visit (HOSPITAL_COMMUNITY)
Admission: RE | Admit: 2016-04-14 | Discharge: 2016-04-14 | Disposition: A | Discharge status: Home / Self Care | Payer: BLUE CROSS/BLUE SHIELD | Source: Ambulatory Visit | Attending: Internal Medicine | Admitting: Internal Medicine

## 2016-04-14 ENCOUNTER — Encounter (HOSPITAL_COMMUNITY)
Admission: RE | Disposition: A | Discharge status: Home / Self Care | Payer: Self-pay | Source: Ambulatory Visit | Attending: Internal Medicine

## 2016-04-14 DIAGNOSIS — I481 Persistent atrial fibrillation: Secondary | ICD-10-CM | POA: Diagnosis not present

## 2016-04-14 DIAGNOSIS — K219 Gastro-esophageal reflux disease without esophagitis: Secondary | ICD-10-CM | POA: Insufficient documentation

## 2016-04-14 DIAGNOSIS — I480 Paroxysmal atrial fibrillation: Secondary | ICD-10-CM

## 2016-04-14 DIAGNOSIS — I5022 Chronic systolic (congestive) heart failure: Secondary | ICD-10-CM | POA: Insufficient documentation

## 2016-04-14 DIAGNOSIS — Z79899 Other long term (current) drug therapy: Secondary | ICD-10-CM | POA: Insufficient documentation

## 2016-04-14 DIAGNOSIS — I110 Hypertensive heart disease with heart failure: Secondary | ICD-10-CM | POA: Diagnosis not present

## 2016-04-14 DIAGNOSIS — G4730 Sleep apnea, unspecified: Secondary | ICD-10-CM | POA: Insufficient documentation

## 2016-04-14 DIAGNOSIS — E7800 Pure hypercholesterolemia, unspecified: Secondary | ICD-10-CM | POA: Diagnosis not present

## 2016-04-14 DIAGNOSIS — Z86718 Personal history of other venous thrombosis and embolism: Secondary | ICD-10-CM | POA: Diagnosis not present

## 2016-04-14 DIAGNOSIS — I2510 Atherosclerotic heart disease of native coronary artery without angina pectoris: Secondary | ICD-10-CM | POA: Diagnosis not present

## 2016-04-14 DIAGNOSIS — I456 Pre-excitation syndrome: Secondary | ICD-10-CM | POA: Diagnosis not present

## 2016-04-14 DIAGNOSIS — Z7902 Long term (current) use of antithrombotics/antiplatelets: Secondary | ICD-10-CM | POA: Diagnosis not present

## 2016-04-14 DIAGNOSIS — Z87891 Personal history of nicotine dependence: Secondary | ICD-10-CM | POA: Insufficient documentation

## 2016-04-14 DIAGNOSIS — G4733 Obstructive sleep apnea (adult) (pediatric): Secondary | ICD-10-CM | POA: Diagnosis not present

## 2016-04-14 DIAGNOSIS — I5023 Acute on chronic systolic (congestive) heart failure: Secondary | ICD-10-CM | POA: Diagnosis not present

## 2016-04-14 DIAGNOSIS — Z955 Presence of coronary angioplasty implant and graft: Secondary | ICD-10-CM | POA: Insufficient documentation

## 2016-04-14 DIAGNOSIS — I4891 Unspecified atrial fibrillation: Secondary | ICD-10-CM | POA: Diagnosis present

## 2016-04-14 HISTORY — PX: CARDIOVERSION: SHX1299

## 2016-04-14 LAB — CBC
HEMATOCRIT: 48.1 % (ref 39.0–52.0)
HEMOGLOBIN: 16.4 g/dL (ref 13.0–17.0)
MCH: 32 pg (ref 26.0–34.0)
MCHC: 34.1 g/dL (ref 30.0–36.0)
MCV: 93.8 fL (ref 78.0–100.0)
Platelets: 257 10*3/uL (ref 150–400)
RBC: 5.13 MIL/uL (ref 4.22–5.81)
RDW: 13 % (ref 11.5–15.5)
WBC: 10 10*3/uL (ref 4.0–10.5)

## 2016-04-14 LAB — BASIC METABOLIC PANEL
Anion gap: 8 (ref 5–15)
BUN: 16 mg/dL (ref 6–20)
CHLORIDE: 102 mmol/L (ref 101–111)
CO2: 30 mmol/L (ref 22–32)
Calcium: 9.5 mg/dL (ref 8.9–10.3)
Creatinine, Ser: 1.26 mg/dL — ABNORMAL HIGH (ref 0.61–1.24)
GFR calc Af Amer: >60 mL/min (ref >60)
GFR calc non Af Amer: >60 mL/min (ref >60)
Glucose, Bld: 116 mg/dL — ABNORMAL HIGH (ref 65–99)
POTASSIUM: 4.4 mmol/L (ref 3.5–5.1)
SODIUM: 140 mmol/L (ref 135–145)

## 2016-04-14 SURGERY — CARDIOVERSION
Anesthesia: General

## 2016-04-14 MED ORDER — SODIUM CHLORIDE 0.9 % IV SOLN
INTRAVENOUS | Status: DC
  Administered 2016-04-14: 500 mL via INTRAVENOUS

## 2016-04-14 MED ORDER — PROPOFOL 10 MG/ML IV BOLUS
INTRAVENOUS | Status: DC | PRN
  Administered 2016-04-14: 140 mg via INTRAVENOUS

## 2016-04-14 MED ORDER — LIDOCAINE 2% (20 MG/ML) 5 ML SYRINGE
INTRAMUSCULAR | Status: DC | PRN
  Administered 2016-04-14: 60 mg via INTRAVENOUS

## 2016-04-14 NOTE — CV Procedure (Addendum)
    CARDIOVERSION NOTE  Procedure: Electrical Cardioversion Indications:  Atrial Fibrillation  Procedure Details:  Consent: Risks of procedure as well as the alternatives and risks of each were explained to the (patient/caregiver).  Consent for procedure obtained.  Time Out: Verified patient identification, verified procedure, site/side was marked, verified correct patient position, special equipment/implants available, medications/allergies/relevent history reviewed, required imaging and test results available.  Performed  Patient placed on cardiac monitor, pulse oximetry, supplemental oxygen as necessary.  Sedation given: Propofol per anesthesia Pacer pads placed anterior and posterior chest.  Cardioverted 1 time(s).  Cardioverted at 150J, 200J and 200J biphasic.  Impression: Findings: Post procedure EKG shows: Atrial Fibrillation Complications: None Patient did tolerate procedure well.  Plan: 1. Unsuccessful DCCV after 3 stacked shocks with >250J delivered at the third shock. Given the history of prior ablations, will like need antiarrythmic medication to help achieve and maintain sinus rhythm.  Time Spent Directly with the Patient:  30 minutes   Pixie Casino, MD, [facility] Attending Cardiologist [place] 04/14/2016, 12:14 PM

## 2016-04-14 NOTE — Discharge Instructions (Signed)
Electrical Cardioversion, Care After °This sheet gives you information about how to care for yourself after your procedure. Your health care provider may also give you more specific instructions. If you have problems or questions, contact your health care provider. °What can I expect after the procedure? °After the procedure, it is common to have: °· Some redness on the skin where the shocks were given. °Follow these instructions at home: °· Do not drive for 24 hours if you were given a medicine to help you relax (sedative). °· Take over-the-counter and prescription medicines only as told by your health care provider. °· Ask your health care provider how to check your pulse. Check it often. °· Rest for 48 hours after the procedure or as told by your health care provider. °· Avoid or limit your caffeine use as told by your health care provider. °Contact a health care provider if: °· You feel like your heart is beating too quickly or your pulse is not regular. °· You have a serious muscle cramp that does not go away. °Get help right away if: °· You have discomfort in your chest. °· You are dizzy or you feel faint. °· You have trouble breathing or you are short of breath. °· Your speech is slurred. °· You have trouble moving an arm or leg on one side of your body. °· Your fingers or toes turn cold or blue. °This information is not intended to replace advice given to you by your health care provider. Make sure you discuss any questions you have with your health care provider. °Document Released: 01/11/2013 Document Revised: 10/25/2015 Document Reviewed: 09/27/2015 °Elsevier Interactive Patient Education © 2017 Elsevier Inc. ° °

## 2016-04-14 NOTE — Anesthesia Preprocedure Evaluation (Addendum)
Anesthesia Evaluation  Patient identified by MRN, date of birth, ID band Patient awake    Reviewed: Allergy & Precautions, H&P , NPO status , Patient's Chart, lab work & pertinent test results, reviewed documented beta blocker date and time   Airway Mallampati: II  TM Distance: >3 FB Neck ROM: Full    Dental no notable dental hx. (+) Teeth Intact, Dental Advisory Given   Pulmonary asthma , sleep apnea , former smoker,    Pulmonary exam normal breath sounds clear to auscultation       Cardiovascular hypertension, Pt. on medications and Pt. on home beta blockers +CHF  + dysrhythmias Atrial Fibrillation  Rhythm:Regular Rate:Normal     Neuro/Psych  Headaches, negative psych ROS   GI/Hepatic Neg liver ROS, GERD  Medicated and Controlled,  Endo/Other  negative endocrine ROS  Renal/GU negative Renal ROS  negative genitourinary   Musculoskeletal   Abdominal   Peds  Hematology negative hematology ROS (+)   Anesthesia Other Findings   Reproductive/Obstetrics negative OB ROS                            Anesthesia Physical Anesthesia Plan  ASA: III  Anesthesia Plan: General   Post-op Pain Management:    Induction: Intravenous  Airway Management Planned: Mask  Additional Equipment:   Intra-op Plan:   Post-operative Plan:   Informed Consent: I have reviewed the patients History and Physical, chart, labs and discussed the procedure including the risks, benefits and alternatives for the proposed anesthesia with the patient or authorized representative who has indicated his/her understanding and acceptance.   Dental advisory given  Plan Discussed with: CRNA  Anesthesia Plan Comments:         Anesthesia Quick Evaluation

## 2016-04-14 NOTE — Transfer of Care (Signed)
Immediate Anesthesia Transfer of Care Note  Patient: Austin Woods  Procedure(s) Performed: Procedure(s): CARDIOVERSION (N/A)  Patient Location: Endoscopy Unit  Anesthesia Type:General  Level of Consciousness: awake, alert , oriented and patient cooperative  Airway & Oxygen Therapy: Patient Spontanous Breathing and Patient connected to nasal cannula oxygen  Post-op Assessment: Report given to RN, Post -op Vital signs reviewed and stable and Patient moving all extremities  Post vital signs: Reviewed and stable  Last Vitals:  Vitals:   04/14/16 1135  BP: (!) 151/81  Resp: 20  Temp: 36.6 C    Last Pain:  Vitals:   04/14/16 1135  TempSrc: Oral         Complications: No apparent anesthesia complications

## 2016-04-14 NOTE — H&P (Signed)
    INTERVAL PROCEDURE H&P  History and Physical Interval Note:  04/14/2016 11:15 AM  Austin Woods has presented today for their planned procedure. The various methods of treatment have been discussed with the patient and family. After consideration of risks, benefits and other options for treatment, the patient has consented to the procedure.  The patients' outpatient history has been reviewed, patient examined, and no change in status from most recent office note within the past 30 days. I have reviewed the patients' chart and labs and will proceed as planned. Questions were answered to the patient's satisfaction.   Pixie Casino, MD, [facility] Attending Cardiologist [place] 04/14/2016, 11:15 AM

## 2016-04-14 NOTE — Anesthesia Postprocedure Evaluation (Signed)
Anesthesia Post Note  Patient: Austin Woods  Procedure(s) Performed: Procedure(s) (LRB): CARDIOVERSION (N/A)  Patient location during evaluation: PACU Anesthesia Type: General Level of consciousness: awake and alert Pain management: pain level controlled Vital Signs Assessment: post-procedure vital signs reviewed and stable Respiratory status: spontaneous breathing, nonlabored ventilation and respiratory function stable Cardiovascular status: blood pressure returned to baseline and stable Postop Assessment: no signs of nausea or vomiting Anesthetic complications: no       Last Vitals:  Vitals:   04/14/16 1135 04/14/16 1217  BP: (!) 151/81 118/84  Pulse:  (!) 117  Resp: 20 (!) 22  Temp: 36.6 C 36.6 C    Last Pain:  Vitals:   04/14/16 1217  TempSrc: Oral                 Ikeisha Blumberg,W. EDMOND

## 2016-04-15 DIAGNOSIS — E1165 Type 2 diabetes mellitus with hyperglycemia: Secondary | ICD-10-CM | POA: Diagnosis not present

## 2016-04-15 DIAGNOSIS — E039 Hypothyroidism, unspecified: Secondary | ICD-10-CM | POA: Diagnosis not present

## 2016-04-15 DIAGNOSIS — Z794 Long term (current) use of insulin: Secondary | ICD-10-CM | POA: Diagnosis not present

## 2016-04-20 ENCOUNTER — Ambulatory Visit (INDEPENDENT_AMBULATORY_CARE_PROVIDER_SITE_OTHER): Payer: BLUE CROSS/BLUE SHIELD | Admitting: *Deleted

## 2016-04-20 DIAGNOSIS — I481 Persistent atrial fibrillation: Secondary | ICD-10-CM | POA: Diagnosis not present

## 2016-04-20 DIAGNOSIS — I4819 Other persistent atrial fibrillation: Secondary | ICD-10-CM

## 2016-04-21 ENCOUNTER — Ambulatory Visit (HOSPITAL_COMMUNITY)
Admission: RE | Admit: 2016-04-21 | Discharge: 2016-04-21 | Disposition: A | Discharge status: Home / Self Care | Payer: BLUE CROSS/BLUE SHIELD | Source: Ambulatory Visit | Attending: Nurse Practitioner | Admitting: Nurse Practitioner

## 2016-04-21 ENCOUNTER — Encounter (HOSPITAL_COMMUNITY): Payer: Self-pay | Admitting: Nurse Practitioner

## 2016-04-21 VITALS — BP 122/78 | HR 105 | Ht 73.0 in | Wt 271.8 lb

## 2016-04-21 DIAGNOSIS — G4730 Sleep apnea, unspecified: Secondary | ICD-10-CM | POA: Diagnosis not present

## 2016-04-21 DIAGNOSIS — I4819 Other persistent atrial fibrillation: Secondary | ICD-10-CM

## 2016-04-21 DIAGNOSIS — E7800 Pure hypercholesterolemia, unspecified: Secondary | ICD-10-CM | POA: Insufficient documentation

## 2016-04-21 DIAGNOSIS — Z79899 Other long term (current) drug therapy: Secondary | ICD-10-CM | POA: Insufficient documentation

## 2016-04-21 DIAGNOSIS — Z7902 Long term (current) use of antithrombotics/antiplatelets: Secondary | ICD-10-CM | POA: Diagnosis not present

## 2016-04-21 DIAGNOSIS — I4891 Unspecified atrial fibrillation: Secondary | ICD-10-CM | POA: Diagnosis present

## 2016-04-21 DIAGNOSIS — J45909 Unspecified asthma, uncomplicated: Secondary | ICD-10-CM | POA: Insufficient documentation

## 2016-04-21 DIAGNOSIS — Z87891 Personal history of nicotine dependence: Secondary | ICD-10-CM | POA: Diagnosis not present

## 2016-04-21 DIAGNOSIS — I481 Persistent atrial fibrillation: Secondary | ICD-10-CM

## 2016-04-21 DIAGNOSIS — I10 Essential (primary) hypertension: Secondary | ICD-10-CM | POA: Insufficient documentation

## 2016-04-21 DIAGNOSIS — I2510 Atherosclerotic heart disease of native coronary artery without angina pectoris: Secondary | ICD-10-CM | POA: Diagnosis not present

## 2016-04-21 DIAGNOSIS — Z7901 Long term (current) use of anticoagulants: Secondary | ICD-10-CM | POA: Insufficient documentation

## 2016-04-21 DIAGNOSIS — K219 Gastro-esophageal reflux disease without esophagitis: Secondary | ICD-10-CM | POA: Insufficient documentation

## 2016-04-21 DIAGNOSIS — Z86718 Personal history of other venous thrombosis and embolism: Secondary | ICD-10-CM | POA: Insufficient documentation

## 2016-04-21 LAB — BASIC METABOLIC PANEL
ANION GAP: 10 (ref 5–15)
BUN: 11 mg/dL (ref 6–20)
CALCIUM: 9.6 mg/dL (ref 8.9–10.3)
CO2: 25 mmol/L (ref 22–32)
Chloride: 105 mmol/L (ref 101–111)
Creatinine, Ser: 0.98 mg/dL (ref 0.61–1.24)
GLUCOSE: 103 mg/dL — AB (ref 65–99)
POTASSIUM: 4.3 mmol/L (ref 3.5–5.1)
Sodium: 140 mmol/L (ref 135–145)

## 2016-04-21 LAB — MAGNESIUM: Magnesium: 2 mg/dL (ref 1.7–2.4)

## 2016-04-21 NOTE — Progress Notes (Signed)
Primary Care Physician: Mickie Hillier, MD EP Physician: Dr. Rayann Heman Cardiologist: Dr. Cecile Sheerer is a 51 y.o. male with a h/o afib with third ablation performed 02/18/16. He had cardioversion 10/26, and c/o of chest pain after the procedure. He was admitted and underwent cardiac catherization 02/03/16 and received a stent of the LAD. He has not had f/u with general cardiology, but was told an appointment with Dr. Bronson Ing would be pending.   He was suppose to go back to work yesterday but called to be seen due to continued pain of rt groin subsequent to hematoma at time of catherization and feeling that the pain at the groin would keep him from doing his job requiring  him to stand on his feet and do heavy lifting.  He is also in rapid afib at 138 bpm, but he was unaware of this. He has not been checking his HR/BP at home and does not know if the afib is persistent.  The continued groin discomfort is more of a concern to him right now.  F/u 12/22, since being seen in the clinic, he underwent treatment of pseudoaneurysm of rt groin which is still not resolved and will f/u with Dr. Trula Slade in the next 4-6 weeks and is out of work until that time. He continued in afib and had unsuccessful cardioversion 04/14/16.   He is in the afib clinic to discuss options and when discussed with Dr. Rayann Heman, he said to offer tikosyn. He has used this in the past and it worked well for him and was only stopped due to previous ablation doing a good job keeping him in rhythm.  Today, he denies symptoms of   chest pain , orthopnea, PND, lower extremity edema, dizziness, presyncope, syncope, or neurologic sequela. Positive for shortness of breath, fatigue, anxious feeling. The patient is tolerating medications without difficulties and is otherwise without complaint today.   Past Medical History:  Diagnosis Date  . Asthma   . GERD (gastroesophageal reflux disease)   . Headache   . High  cholesterol   . History of DVT (deep vein thrombosis)    a. left leg following ablation for SVT  . Hypertension   . Persistent atrial fibrillation ([place])    a. Dx 08/2012, xarelto initiated; b. 09/2012 Tikosyn initiated -> DCCV -> Sinus c. PVI 03/2013 d. PVI 03/2014  . Sleep apnea    does not use CPAP (he reports having uvulectomy)   . Wolff-Parkinson-White (WPW) syndrome    a. s/p RFCA 2001 by Dr Caryl Comes, pt reports complicated by post procedure DVT   Past Surgical History:  Procedure Laterality Date  . ATRIAL FIBRILLATION ABLATION  03/23/2013   PVI by DR Rayann Heman for afib  . ATRIAL FIBRILLATION ABLATION N/A 03/23/2013   Procedure: ATRIAL FIBRILLATION ABLATION;  Surgeon: Coralyn Mark, MD;  Location: [place] CATH LAB;  Service: Cardiovascular;  Laterality: N/A;  . ATRIAL FIBRILLATION ABLATION N/A 03/27/2014   PVI Dr Rayann Heman  . BACK SURGERY    . CARDIAC CATHETERIZATION N/A 02/03/2016   Procedure: Left Heart Cath and Coronary Angiography;  Surgeon: Nelva Bush, MD;  Location: [place] CV LAB;  Service: Cardiovascular;  Laterality: N/A;  . CARDIAC CATHETERIZATION N/A 02/03/2016   Procedure: Intravascular Pressure Wire/FFR Study;  Surgeon: Nelva Bush, MD;  Location: [place] CV LAB;  Service: Cardiovascular;  Laterality: N/A;  . CARDIAC CATHETERIZATION N/A 02/03/2016   Procedure: Coronary Stent Intervention;  Surgeon: Nelva Bush, MD;  Location:  [place] INVASIVE CV LAB;  Service: Cardiovascular;  Laterality: N/A;  . CARDIAC SURGERY    . CARDIOVERSION N/A 08/25/2012   Procedure: CARDIOVERSION;  Surgeon: Thayer Headings, MD;  Location: [facility] ENDOSCOPY;  Service: Cardiovascular;  Laterality: N/A;  . CARDIOVERSION N/A 09/06/2012   Procedure: CARDIOVERSION/Bedside;  Surgeon: Carlena Bjornstad, MD;  Location: [place];  Service: Cardiovascular;  Laterality: N/A;  . CARDIOVERSION N/A 01/12/2014   Procedure: CARDIOVERSION;  Surgeon: Pixie Casino, MD;  Location: [facility] ENDOSCOPY;  Service: Cardiovascular;   Laterality: N/A;  . CARDIOVERSION N/A 01/30/2016   Procedure: CARDIOVERSION;  Surgeon: Sueanne Margarita, MD;  Location: [facility] ENDOSCOPY;  Service: Cardiovascular;  Laterality: N/A;  . CARDIOVERSION N/A 04/14/2016   Procedure: CARDIOVERSION;  Surgeon: Pixie Casino, MD;  Location: [facility] ENDOSCOPY;  Service: Cardiovascular;  Laterality: N/A;  . COLONOSCOPY N/A 12/04/2015   Procedure: COLONOSCOPY;  Surgeon: Rogene Houston, MD;  [facility] ENDO [facility];  Service: Endoscopy;  Laterality: N/A;  730  . ELECTROPHYSIOLOGIC STUDY N/A 02/18/2016   Procedure: Atrial Fibrillation Ablation;  Surgeon: Thompson Grayer, MD;  Location: [place] CV LAB;  Service: Cardiovascular;  Laterality: N/A;  . LOOP RECORDER IMPLANT N/A 06/29/2014   Procedure: LOOP RECORDER IMPLANT;  Surgeon: Thompson Grayer, MD;  Location: [facility] CATH LAB;  Service: Cardiovascular;  Laterality: N/A;  . NOSE SURGERY     sleep apnea surgery  . PERIPHERAL VASCULAR CATHETERIZATION N/A 03/13/2016   Procedure: Thrombin Injection;  Surgeon: Serafina Mitchell, MD;  Location: [place] CV LAB;  Service: Cardiovascular;  Laterality: N/A;  . STOMACH SURGERY     reflux, fundoplication  . TEE WITHOUT CARDIOVERSION N/A 08/25/2012   Procedure: TRANSESOPHAGEAL ECHOCARDIOGRAM (TEE);  Surgeon: Thayer Headings, MD;  Location: [place];  Service: Cardiovascular;  Laterality: N/A;  . TEE WITHOUT CARDIOVERSION  03/23/2013   DR ROSS  . TEE WITHOUT CARDIOVERSION N/A 03/23/2013   Procedure: TRANSESOPHAGEAL ECHOCARDIOGRAM (TEE);  Surgeon: Fay Records, MD;  Location: [facility] ENDOSCOPY;  Service: Cardiovascular;  Laterality: N/A;  . TEE WITHOUT CARDIOVERSION N/A 03/26/2014   Procedure: TRANSESOPHAGEAL ECHOCARDIOGRAM (TEE);  Surgeon: Josue Hector, MD;  Location: [facility] ENDOSCOPY;  Service: Cardiovascular;  Laterality: N/A;  . TONSILLECTOMY      Current Outpatient Prescriptions  Medication Sig Dispense Refill  . acetaminophen (TYLENOL) 500 MG tablet Take 1,000 mg by mouth every 6  (six) hours as needed for headache.    . albuterol (PROVENTIL HFA) 108 (90 Base) MCG/ACT inhaler Inhale 2 puffs into the lungs every 4 (four) hours as needed for wheezing or shortness of breath. 1 Inhaler 0  . atorvastatin (LIPITOR) 10 MG tablet Take 1 tablet (10 mg total) by mouth daily. 30 tablet 0  . Cholecalciferol (VITAMIN D) 2000 UNITS CAPS Take 2 capsules by mouth daily.     . clopidogrel (PLAVIX) 75 MG tablet Take 1 tablet (75 mg total) by mouth daily with breakfast. 90 tablet 3  . enalapril (VASOTEC) 10 MG tablet Take 1 tablet (10 mg total) by mouth daily. 90 tablet 3  . Fluticasone-Salmeterol (ADVAIR DISKUS) 250-50 MCG/DOSE AEPB Inhale 1 puff into the lungs every 12 (twelve) hours. 60 each 0  . furosemide (LASIX) 40 MG tablet TAKE 1 TABLET BY MOUTH  DAILY 30 tablet 0  . ketoconazole (NIZORAL) 2 % cream Apply 1 application topically 2 (two) times daily. (Patient taking differently: Apply 1 application topically daily as needed for irritation. ) 30 g 4  . magnesium 30 MG tablet Take 30  mg by mouth 2 (two) times daily.    . metoprolol succinate (TOPROL-XL) 100 MG 24 hr tablet Take 1 tablet (100 mg total) by mouth 2 (two) times daily. Take with or immediately following a meal. 180 tablet 3  . nitroGLYCERIN (NITROSTAT) 0.4 MG SL tablet Place 1 tablet (0.4 mg total) under the tongue every 5 (five) minutes x 3 doses as needed for chest pain. 75 tablet 1  . oxybutynin (DITROPAN) 5 MG tablet Take 0.5 tablets (2.5 mg total) by mouth 2 (two) times daily as needed for bladder spasms. 45 tablet 3  . pantoprazole (PROTONIX) 40 MG tablet Take 1 tablet (40 mg total) by mouth daily. 90 tablet 3  . Potassium Chloride ER 20 MEQ TBCR Take 1 tablet by mouth daily. 90 tablet 1  . PROCTOZONE-HC 2.5 % rectal cream Apply 3 times daily (Patient taking differently: Apply rectally as needed for hemorrhoids) 90 g 3  . rivaroxaban (XARELTO) 20 MG TABS tablet Take 1 tablet (20 mg total) by mouth daily with supper. 90  tablet 3  . tadalafil (CIALIS) 5 MG tablet Take 1 tablet (5 mg total) by mouth daily as needed for erectile dysfunction. 10 tablet 0  . triamcinolone cream (KENALOG) 0.1 % Apply 1 application topically 2 (two) times daily. Prn rash; use up to 2 weeks 30 g 0   No current facility-administered medications for this encounter.     Allergies  Allergen Reactions  . Pollen Extract     Runny nose Watery eyes Nasal congestion    Social History   Social History  . Marital status: Married    Spouse name: N/A  . Number of children: 2  . Years of education: N/A   Occupational History  .  [employer]   Social History Main Topics  . Smoking status: Former Smoker    Packs/day: 1.00    Years: 7.00    Types: Cigarettes    Start date: 05/19/2003    Quit date: 08/06/2010  . Smokeless tobacco: Never Used  . Alcohol use No  . Drug use: No  . Sexual activity: Not on file   Other Topics Concern  . Not on file   Social History Narrative   Pt lives in [place] with wife.  Works at [employer] History  Problem Relation Age of Onset  . CAD Father 60  . Diabetes Father   . Heart attack Father   . Transient ischemic attack Father   . Vascular Disease Father   . Diabetes Mother   . Anemia Mother   . Gout Mother   . Aneurysm Maternal Grandmother   . Stroke Maternal Grandfather   . Diabetes Paternal Grandmother   . Stroke Paternal Grandfather   . Diabetes Brother     ROS- All systems are reviewed and negative except as per the HPI above  Physical Exam: Vitals:   04/21/16 1025  BP: 122/78  Pulse: (!) 105  Weight: 271 lb 12.8 oz (123.3 kg)  Height: 6\' 1"  (1.854 m)    GEN- The patient is well appearing, alert and oriented x 3 today.   Head- normocephalic, atraumatic Eyes-  Sclera clear, conjunctiva pink Ears- hearing intact Oropharynx- clear Neck- supple, no JVP Lymph- no cervical lymphadenopathy Lungs- Clear to ausculation bilaterally, normal work of breathing Heart-   irregular rate and rhythm, no murmurs, rubs or gallops, PMI not laterally displaced GI- soft, NT, ND, + BS Extremities- no clubbing, cyanosis, or edema.  MS- no significant deformity or atrophy Skin- no rash or lesion Psych- euthymic mood, full affect Neuro- strength and sensation are intact  EKG-afib with rvr at 105 bpm, qrs int 78 ms, qtc 457 ms Epic records reviewed  Assessment and Plan: 1.Persisitent Afib, s/p ablation Failed cardioversion Discussed with him hospitalization for tikosyn which he has used before and did a good job to maintain SR He checked with insurance and he can afford, generics are free for him Continue metoprolol No missed doses of xarelto [address] to help with hospitalization qtc in SR usually around 450 ms, and did help  with Tikosyn before  2. Rt groin pseudoaneurysm  Per Dr. Trula Slade   3. CAD stable Stent placement of LAD 10/30 Continue plavix  Continue BB, statin  F/u with Dr. Rayann Heman 2/14 afib clinic as needed   Butch Penny C. Willine Schwalbe, [facility] [address] [place], [place] [address] [phone]

## 2016-04-21 NOTE — Progress Notes (Signed)
Carelink Summary Report 

## 2016-04-27 ENCOUNTER — Ambulatory Visit (INDEPENDENT_AMBULATORY_CARE_PROVIDER_SITE_OTHER): Payer: BLUE CROSS/BLUE SHIELD | Admitting: Pharmacist

## 2016-04-27 ENCOUNTER — Encounter (HOSPITAL_COMMUNITY): Payer: Self-pay | Admitting: *Deleted

## 2016-04-27 ENCOUNTER — Inpatient Hospital Stay (HOSPITAL_COMMUNITY)
Admission: AD | Admit: 2016-04-27 | Discharge: 2016-04-30 | DRG: 310 | Disposition: A | Discharge status: Home / Self Care | Payer: BLUE CROSS/BLUE SHIELD | Source: Ambulatory Visit | Attending: Internal Medicine | Admitting: Internal Medicine

## 2016-04-27 VITALS — Wt 278.0 lb

## 2016-04-27 DIAGNOSIS — K219 Gastro-esophageal reflux disease without esophagitis: Secondary | ICD-10-CM | POA: Diagnosis not present

## 2016-04-27 DIAGNOSIS — Z8249 Family history of ischemic heart disease and other diseases of the circulatory system: Secondary | ICD-10-CM

## 2016-04-27 DIAGNOSIS — Z87891 Personal history of nicotine dependence: Secondary | ICD-10-CM

## 2016-04-27 DIAGNOSIS — G4730 Sleep apnea, unspecified: Secondary | ICD-10-CM | POA: Diagnosis present

## 2016-04-27 DIAGNOSIS — G4733 Obstructive sleep apnea (adult) (pediatric): Secondary | ICD-10-CM

## 2016-04-27 DIAGNOSIS — I481 Persistent atrial fibrillation: Principal | ICD-10-CM

## 2016-04-27 DIAGNOSIS — Z7901 Long term (current) use of anticoagulants: Secondary | ICD-10-CM | POA: Diagnosis not present

## 2016-04-27 DIAGNOSIS — J45909 Unspecified asthma, uncomplicated: Secondary | ICD-10-CM | POA: Diagnosis present

## 2016-04-27 DIAGNOSIS — Z7951 Long term (current) use of inhaled steroids: Secondary | ICD-10-CM | POA: Diagnosis not present

## 2016-04-27 DIAGNOSIS — Z833 Family history of diabetes mellitus: Secondary | ICD-10-CM | POA: Diagnosis not present

## 2016-04-27 DIAGNOSIS — Z955 Presence of coronary angioplasty implant and graft: Secondary | ICD-10-CM

## 2016-04-27 DIAGNOSIS — I429 Cardiomyopathy, unspecified: Secondary | ICD-10-CM | POA: Diagnosis present

## 2016-04-27 DIAGNOSIS — Z7902 Long term (current) use of antithrombotics/antiplatelets: Secondary | ICD-10-CM

## 2016-04-27 DIAGNOSIS — Z6835 Body mass index (BMI) 35.0-35.9, adult: Secondary | ICD-10-CM | POA: Diagnosis not present

## 2016-04-27 DIAGNOSIS — I10 Essential (primary) hypertension: Secondary | ICD-10-CM | POA: Diagnosis present

## 2016-04-27 DIAGNOSIS — I2510 Atherosclerotic heart disease of native coronary artery without angina pectoris: Secondary | ICD-10-CM | POA: Diagnosis present

## 2016-04-27 DIAGNOSIS — I4891 Unspecified atrial fibrillation: Secondary | ICD-10-CM | POA: Diagnosis present

## 2016-04-27 DIAGNOSIS — Z86718 Personal history of other venous thrombosis and embolism: Secondary | ICD-10-CM | POA: Diagnosis not present

## 2016-04-27 DIAGNOSIS — Z79899 Other long term (current) drug therapy: Secondary | ICD-10-CM | POA: Diagnosis not present

## 2016-04-27 DIAGNOSIS — Z9889 Other specified postprocedural states: Secondary | ICD-10-CM | POA: Diagnosis not present

## 2016-04-27 DIAGNOSIS — Z823 Family history of stroke: Secondary | ICD-10-CM

## 2016-04-27 DIAGNOSIS — I4819 Other persistent atrial fibrillation: Secondary | ICD-10-CM | POA: Diagnosis present

## 2016-04-27 LAB — BASIC METABOLIC PANEL
Anion gap: 11 (ref 5–15)
BUN/Creatinine Ratio: 13 (ref 9–20)
BUN: 14 mg/dL (ref 6–24)
BUN: 15 mg/dL (ref 6–20)
CALCIUM: 9.4 mg/dL (ref 8.7–10.2)
CHLORIDE: 105 mmol/L (ref 101–111)
CO2: 29 mmol/L (ref 18–29)
CO2: 23 mmol/L (ref 22–32)
CREATININE: 1.07 mg/dL (ref 0.76–1.27)
CREATININE: 1.22 mg/dL (ref 0.61–1.24)
Calcium: 9.1 mg/dL (ref 8.9–10.3)
Chloride: 104 mmol/L (ref 96–106)
GFR calc Af Amer: >60 mL/min (ref >60)
GFR calc non Af Amer: >60 mL/min (ref >60)
GFR, EST AFRICAN AMERICAN: 92 mL/min/{1.73_m2} (ref >59)
GFR, EST NON AFRICAN AMERICAN: 79 mL/min/{1.73_m2} (ref >59)
Glucose, Bld: 140 mg/dL — ABNORMAL HIGH (ref 65–99)
Glucose: 109 mg/dL — ABNORMAL HIGH (ref 65–99)
POTASSIUM: 4.5 mmol/L (ref 3.5–5.2)
Potassium: 4.2 mmol/L (ref 3.5–5.1)
SODIUM: 139 mmol/L (ref 135–145)
Sodium: 139 mmol/L (ref 134–144)

## 2016-04-27 LAB — MAGNESIUM
MAGNESIUM: 2.1 mg/dL (ref 1.7–2.4)
Magnesium: 2.1 mg/dL (ref 1.6–2.3)

## 2016-04-27 MED ORDER — ENALAPRIL MALEATE 5 MG PO TABS
10.0000 mg | ORAL_TABLET | Freq: Every day | ORAL | Status: DC
  Administered 2016-04-28 – 2016-04-30 (×3): 10 mg via ORAL
  Filled 2016-04-27 (×3): qty 2

## 2016-04-27 MED ORDER — MAGNESIUM OXIDE 400 (241.3 MG) MG PO TABS
200.0000 mg | ORAL_TABLET | Freq: Two times a day (BID) | ORAL | Status: DC
  Administered 2016-04-27 – 2016-04-30 (×6): 200 mg via ORAL
  Filled 2016-04-27 (×5): qty 1

## 2016-04-27 MED ORDER — METOPROLOL SUCCINATE ER 100 MG PO TB24
100.0000 mg | ORAL_TABLET | Freq: Two times a day (BID) | ORAL | Status: DC
  Administered 2016-04-27 – 2016-04-29 (×5): 100 mg via ORAL
  Filled 2016-04-27 (×5): qty 1

## 2016-04-27 MED ORDER — SODIUM CHLORIDE 0.9 % IV SOLN
250.0000 mL | INTRAVENOUS | Status: DC | PRN
Start: 2016-04-27 — End: 2016-04-30

## 2016-04-27 MED ORDER — SODIUM CHLORIDE 0.9% FLUSH
3.0000 mL | Freq: Two times a day (BID) | INTRAVENOUS | Status: DC
  Administered 2016-04-28 – 2016-04-29 (×3): 3 mL via INTRAVENOUS

## 2016-04-27 MED ORDER — ALBUTEROL SULFATE (2.5 MG/3ML) 0.083% IN NEBU: 2.5000 mg | INHALATION_SOLUTION | RESPIRATORY_TRACT | Status: DC | PRN

## 2016-04-27 MED ORDER — SODIUM CHLORIDE 0.9% FLUSH: 3.0000 mL | INTRAVENOUS | Status: DC | PRN

## 2016-04-27 MED ORDER — DOFETILIDE 500 MCG PO CAPS
500.0000 ug | ORAL_CAPSULE | Freq: Two times a day (BID) | ORAL | Status: DC
  Administered 2016-04-27 – 2016-04-30 (×6): 500 ug via ORAL
  Filled 2016-04-27 (×6): qty 1

## 2016-04-27 MED ORDER — VITAMIN D 1000 UNITS PO TABS
2000.0000 [IU] | ORAL_TABLET | Freq: Every day | ORAL | Status: DC
  Administered 2016-04-28 – 2016-04-30 (×3): 2000 [IU] via ORAL
  Filled 2016-04-27 (×3): qty 2

## 2016-04-27 MED ORDER — OXYBUTYNIN CHLORIDE 5 MG PO TABS: 2.5000 mg | ORAL_TABLET | Freq: Two times a day (BID) | ORAL | Status: DC | PRN

## 2016-04-27 MED ORDER — PNEUMOCOCCAL VAC POLYVALENT 25 MCG/0.5ML IJ INJ
0.5000 mL | INJECTION | INTRAMUSCULAR | Status: AC
  Administered 2016-04-29: 0.5 mL via INTRAMUSCULAR
  Filled 2016-04-27: qty 0.5

## 2016-04-27 MED ORDER — MAGNESIUM 200 MG PO TABS
200.0000 mg | ORAL_TABLET | Freq: Two times a day (BID) | ORAL | Status: DC
  Filled 2016-04-27 (×2): qty 1

## 2016-04-27 MED ORDER — POTASSIUM CHLORIDE ER 10 MEQ PO TBCR
20.0000 meq | EXTENDED_RELEASE_TABLET | Freq: Every day | ORAL | Status: DC
  Administered 2016-04-28 – 2016-04-30 (×3): 20 meq via ORAL
  Filled 2016-04-27 (×6): qty 2

## 2016-04-27 MED ORDER — FUROSEMIDE 40 MG PO TABS
40.0000 mg | ORAL_TABLET | Freq: Every day | ORAL | Status: DC
  Administered 2016-04-28 – 2016-04-30 (×3): 40 mg via ORAL
  Filled 2016-04-27 (×3): qty 1

## 2016-04-27 MED ORDER — MOMETASONE FURO-FORMOTEROL FUM 200-5 MCG/ACT IN AERO
2.0000 | INHALATION_SPRAY | Freq: Two times a day (BID) | RESPIRATORY_TRACT | Status: DC
  Administered 2016-04-28 – 2016-04-30 (×5): 2 via RESPIRATORY_TRACT
  Filled 2016-04-27: qty 8.8

## 2016-04-27 MED ORDER — CLOPIDOGREL BISULFATE 75 MG PO TABS
75.0000 mg | ORAL_TABLET | Freq: Every day | ORAL | Status: DC
  Administered 2016-04-28 – 2016-04-30 (×3): 75 mg via ORAL
  Filled 2016-04-27 (×3): qty 1

## 2016-04-27 MED ORDER — RIVAROXABAN 20 MG PO TABS
20.0000 mg | ORAL_TABLET | Freq: Every day | ORAL | Status: DC
  Administered 2016-04-27 – 2016-04-29 (×3): 20 mg via ORAL
  Filled 2016-04-27 (×3): qty 1

## 2016-04-27 MED ORDER — PANTOPRAZOLE SODIUM 40 MG PO TBEC
40.0000 mg | DELAYED_RELEASE_TABLET | Freq: Every day | ORAL | Status: DC
  Administered 2016-04-28 – 2016-04-30 (×3): 40 mg via ORAL
  Filled 2016-04-27 (×3): qty 1

## 2016-04-27 MED ORDER — ALPRAZOLAM 0.5 MG PO TABS
0.5000 mg | ORAL_TABLET | Freq: Three times a day (TID) | ORAL | Status: DC | PRN
  Filled 2016-04-27: qty 1

## 2016-04-27 MED ORDER — ATORVASTATIN CALCIUM 10 MG PO TABS
10.0000 mg | ORAL_TABLET | Freq: Every day | ORAL | Status: DC
  Administered 2016-04-27 – 2016-04-30 (×4): 10 mg via ORAL
  Filled 2016-04-27 (×4): qty 1

## 2016-04-27 NOTE — Progress Notes (Signed)
Patient ID: ULRIC USEY                 DOB: 1965/04/16                    MRN: [id number]     HPI: Austin Woods is a 51 y.o. male patient of Dr. Rayann Heman who is referred to [place] clinic for re-initiation of therapy by Roderic Palau, NP. PMH is significant for atrial fibrillation with 3rd ablation performed 02/18/16. He had a cardioversion on 01/30/16 and complained of chest pain after the procedure. He was admitted and underwent cardiac cath on 02/03/16 and received a stent to the LAD. He was in rapid afib at most recent visit with Butch Penny on 04/21/16. She discussed with Dr Rayann Heman, who suggested Tikosyn since pt previously did well with this therapy. It was only stopped due to previous ablation that kept him in rhythm.   Pt educated on potential risks with Tikosyn including QTc prolongation. Pt is aware of the importance of not missing any doses and will call the office if they miss more than 2 doses in a row. Pt is anticoagulated with Xarelto 20mg  daily and reports no missed doses within the past month. Pt is currently not taking any QTc prolongating or contraindicated medications. Generic medications are free through patient's insurance.  EKG: EKG reviewed by Dr Rayann Heman. Atrial fibrillation with vent rate of 99bpm and QTc of 479msec, ok per Dr Rayann Heman.  Labs: K 4.5, Mg 2.1, SCr 1.07, Wt 278 lbs, CrCl > 140mL/min  Past Medical History:  Diagnosis Date  . Asthma   . GERD (gastroesophageal reflux disease)   . Headache   . High cholesterol   . History of DVT (deep vein thrombosis)    a. left leg following ablation for SVT  . Hypertension   . Persistent atrial fibrillation ([place])    a. Dx 08/2012, xarelto initiated; b. 09/2012 Tikosyn initiated -> DCCV -> Sinus c. PVI 03/2013 d. PVI 03/2014  . Sleep apnea    does not use CPAP (he reports having uvulectomy)   . Wolff-Parkinson-White (WPW) syndrome    a. s/p RFCA 2001 by Dr Caryl Comes, pt reports complicated by post procedure DVT    Current  Outpatient Prescriptions on File Prior to Visit  Medication Sig Dispense Refill  . acetaminophen (TYLENOL) 500 MG tablet Take 1,000 mg by mouth every 6 (six) hours as needed for headache.    . albuterol (PROVENTIL HFA) 108 (90 Base) MCG/ACT inhaler Inhale 2 puffs into the lungs every 4 (four) hours as needed for wheezing or shortness of breath. 1 Inhaler 0  . atorvastatin (LIPITOR) 10 MG tablet Take 1 tablet (10 mg total) by mouth daily. 30 tablet 0  . Cholecalciferol (VITAMIN D) 2000 UNITS CAPS Take 2 capsules by mouth daily.     . clopidogrel (PLAVIX) 75 MG tablet Take 1 tablet (75 mg total) by mouth daily with breakfast. 90 tablet 3  . enalapril (VASOTEC) 10 MG tablet Take 1 tablet (10 mg total) by mouth daily. 90 tablet 3  . Fluticasone-Salmeterol (ADVAIR DISKUS) 250-50 MCG/DOSE AEPB Inhale 1 puff into the lungs every 12 (twelve) hours. 60 each 0  . furosemide (LASIX) 40 MG tablet TAKE 1 TABLET BY MOUTH  DAILY 30 tablet 0  . ketoconazole (NIZORAL) 2 % cream Apply 1 application topically 2 (two) times daily. (Patient taking differently: Apply 1 application topically daily as needed for irritation. ) 30 g 4  . magnesium 30  MG tablet Take 30 mg by mouth 2 (two) times daily.    . metoprolol succinate (TOPROL-XL) 100 MG 24 hr tablet Take 1 tablet (100 mg total) by mouth 2 (two) times daily. Take with or immediately following a meal. 180 tablet 3  . nitroGLYCERIN (NITROSTAT) 0.4 MG SL tablet Place 1 tablet (0.4 mg total) under the tongue every 5 (five) minutes x 3 doses as needed for chest pain. 75 tablet 1  . oxybutynin (DITROPAN) 5 MG tablet Take 0.5 tablets (2.5 mg total) by mouth 2 (two) times daily as needed for bladder spasms. 45 tablet 3  . pantoprazole (PROTONIX) 40 MG tablet Take 1 tablet (40 mg total) by mouth daily. 90 tablet 3  . Potassium Chloride ER 20 MEQ TBCR Take 1 tablet by mouth daily. 90 tablet 1  . PROCTOZONE-HC 2.5 % rectal cream Apply 3 times daily (Patient taking differently:  Apply rectally as needed for hemorrhoids) 90 g 3  . rivaroxaban (XARELTO) 20 MG TABS tablet Take 1 tablet (20 mg total) by mouth daily with supper. 90 tablet 3  . tadalafil (CIALIS) 5 MG tablet Take 1 tablet (5 mg total) by mouth daily as needed for erectile dysfunction. 10 tablet 0  . triamcinolone cream (KENALOG) 0.1 % Apply 1 application topically 2 (two) times daily. Prn rash; use up to 2 weeks 30 g 0   No current facility-administered medications on file prior to visit.     Allergies  Allergen Reactions  . Pollen Extract     Runny nose Watery eyes Nasal congestion    Assessment/Plan:  1. Atrial fibrillation - QTc slightly elevated but ok per Dr Rayann Heman for Tikosyn admission. K and Mg both at goal. CrCl > 44mL/min, anticipated starting dose of Tikosyn is 567mcg BID. Pt aware to report to admitting. Of note, pt prefers generic dofetilide upon discharge for affordability.   Megan E. Supple, PharmD, CPP, [place] [id number] [address]. [address] [place], [place] [address] Phone: [phone]; Fax: [phone] 04/27/2016 11:29 AM

## 2016-04-27 NOTE — H&P (Signed)
ADMISSION HISTORY & PHYSICAL   Chief Complaint:  Admitted to tikosyn loading  Cardiologist: Dr. Rayann Heman  Primary Care Physician: Mickie Hillier, MD  HPI:  This is a 51 y.o. male with a past medical history significant for atrial fibrillation and 3 prior ablations, last performed on 02/18/2016. He was cardioverted in October 2017 and complained of chest pain after that procedure. He was admitted for cardiac catheterization on 02/03/2016 and received a stent to the LAD. 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 He continued be in A. fib and underwent unsuccessful cardioversion on 04/14/2016 by myself. He was then referred back to the A. fib clinic for options including Tikosyn loading. He then saw Megan supple, Pharm.D., who educated the patient about Tikosyn. She noted he is anticoagulated on Xarelto has not missed any dose over the past month. He was therefore directly admitted for Tikosyn loading.  PMHx:  Past Medical History:  Diagnosis Date  . Asthma   . GERD (gastroesophageal reflux disease)   . Headache   . High cholesterol   . History of DVT (deep vein thrombosis)    a. left leg following ablation for SVT  . Hypertension   . Persistent atrial fibrillation ([place])    a. Dx 08/2012, xarelto initiated; b. 09/2012 Tikosyn initiated -> DCCV -> Sinus c. PVI 03/2013 d. PVI 03/2014  . Sleep apnea    does not use CPAP (he reports having uvulectomy)   . Wolff-Parkinson-White (WPW) syndrome    a. s/p RFCA 2001 by Dr Caryl Comes, pt reports complicated by post procedure DVT    Past Surgical History:  Procedure Laterality Date  . ATRIAL FIBRILLATION ABLATION  03/23/2013   PVI by DR Rayann Heman for afib  . ATRIAL FIBRILLATION ABLATION N/A 03/23/2013   Procedure: ATRIAL FIBRILLATION ABLATION;  Surgeon: Coralyn Mark, MD;  Location: [place] CATH LAB;  Service: Cardiovascular;  Laterality: N/A;  . ATRIAL FIBRILLATION ABLATION N/A 03/27/2014   PVI Dr Rayann Heman  . BACK  SURGERY    . CARDIAC CATHETERIZATION N/A 02/03/2016   Procedure: Left Heart Cath and Coronary Angiography;  Surgeon: Nelva Bush, MD;  Location: [place] CV LAB;  Service: Cardiovascular;  Laterality: N/A;  . CARDIAC CATHETERIZATION N/A 02/03/2016   Procedure: Intravascular Pressure Wire/FFR Study;  Surgeon: Nelva Bush, MD;  Location: [place] CV LAB;  Service: Cardiovascular;  Laterality: N/A;  . CARDIAC CATHETERIZATION N/A 02/03/2016   Procedure: Coronary Stent Intervention;  Surgeon: Nelva Bush, MD;  Location: [place] CV LAB;  Service: Cardiovascular;  Laterality: N/A;  . CARDIAC SURGERY    . CARDIOVERSION N/A 08/25/2012   Procedure: CARDIOVERSION;  Surgeon: Thayer Headings, MD;  Location: [facility] ENDOSCOPY;  Service: Cardiovascular;  Laterality: N/A;  . CARDIOVERSION N/A 09/06/2012   Procedure: CARDIOVERSION/Bedside;  Surgeon: Carlena Bjornstad, MD;  Location: [place];  Service: Cardiovascular;  Laterality: N/A;  . CARDIOVERSION N/A 01/12/2014   Procedure: CARDIOVERSION;  Surgeon: Pixie Casino, MD;  Location: [facility] ENDOSCOPY;  Service: Cardiovascular;  Laterality: N/A;  . CARDIOVERSION N/A 01/30/2016   Procedure: CARDIOVERSION;  Surgeon: Sueanne Margarita, MD;  Location: [facility] ENDOSCOPY;  Service: Cardiovascular;  Laterality: N/A;  . CARDIOVERSION N/A 04/14/2016   Procedure: CARDIOVERSION;  Surgeon: Pixie Casino, MD;  Location: [facility] ENDOSCOPY;  Service: Cardiovascular;  Laterality: N/A;  . COLONOSCOPY N/A 12/04/2015   Procedure: COLONOSCOPY;  Surgeon: Rogene Houston, MD;  [facility]O [facility];  Service: Endoscopy;  Laterality: N/A;  730  . ELECTROPHYSIOLOGIC  STUDY N/A 02/18/2016   Procedure: Atrial Fibrillation Ablation;  Surgeon: Thompson Grayer, MD;  Location: [place] CV LAB;  Service: Cardiovascular;  Laterality: N/A;  . LOOP RECORDER IMPLANT N/A 06/29/2014   Procedure: LOOP RECORDER IMPLANT;  Surgeon: Thompson Grayer, MD;  Location: [facility] CATH LAB;  Service: Cardiovascular;   Laterality: N/A;  . NOSE SURGERY     sleep apnea surgery  . PERIPHERAL VASCULAR CATHETERIZATION N/A 03/13/2016   Procedure: Thrombin Injection;  Surgeon: Serafina Mitchell, MD;  Location: [place] CV LAB;  Service: Cardiovascular;  Laterality: N/A;  . STOMACH SURGERY     reflux, fundoplication  . TEE WITHOUT CARDIOVERSION N/A 08/25/2012   Procedure: TRANSESOPHAGEAL ECHOCARDIOGRAM (TEE);  Surgeon: Thayer Headings, MD;  Location: [place];  Service: Cardiovascular;  Laterality: N/A;  . TEE WITHOUT CARDIOVERSION  03/23/2013   DR ROSS  . TEE WITHOUT CARDIOVERSION N/A 03/23/2013   Procedure: TRANSESOPHAGEAL ECHOCARDIOGRAM (TEE);  Surgeon: Fay Records, MD;  Location: [facility] ENDOSCOPY;  Service: Cardiovascular;  Laterality: N/A;  . TEE WITHOUT CARDIOVERSION N/A 03/26/2014   Procedure: TRANSESOPHAGEAL ECHOCARDIOGRAM (TEE);  Surgeon: Josue Hector, MD;  Location: [facility] ENDOSCOPY;  Service: Cardiovascular;  Laterality: N/A;  . TONSILLECTOMY      FAMHx:  Family History  Problem Relation Age of Onset  . CAD Father 36  . Diabetes Father   . Heart attack Father   . Transient ischemic attack Father   . Vascular Disease Father   . Diabetes Mother   . Anemia Mother   . Gout Mother   . Aneurysm Maternal Grandmother   . Stroke Maternal Grandfather   . Diabetes Paternal Grandmother   . Stroke Paternal Grandfather   . Diabetes Brother     SOCHx:   reports that he quit smoking about 5 years ago. His smoking use included Cigarettes. He started smoking about 12 years ago. He has a 7.00 pack-year smoking history. He has never used smokeless tobacco. He reports that he does not drink alcohol or use drugs.  ALLERGIES:  Allergies  Allergen Reactions  . Pollen Extract     Runny nose Watery eyes Nasal congestion    ROS: Pertinent items noted in HPI and remainder of comprehensive ROS otherwise negative.  HOME MEDS: No current facility-administered medications on file prior to encounter.     Current Outpatient Prescriptions on File Prior to Encounter  Medication Sig Dispense Refill  . acetaminophen (TYLENOL) 500 MG tablet Take 1,000 mg by mouth every 6 (six) hours as needed for headache.    . albuterol (PROVENTIL HFA) 108 (90 Base) MCG/ACT inhaler Inhale 2 puffs into the lungs every 4 (four) hours as needed for wheezing or shortness of breath. 1 Inhaler 0  . atorvastatin (LIPITOR) 10 MG tablet Take 1 tablet (10 mg total) by mouth daily. 30 tablet 0  . Cholecalciferol (VITAMIN D) 2000 UNITS CAPS Take 2 capsules by mouth daily.     . clopidogrel (PLAVIX) 75 MG tablet Take 1 tablet (75 mg total) by mouth daily with breakfast. 90 tablet 3  . enalapril (VASOTEC) 10 MG tablet Take 1 tablet (10 mg total) by mouth daily. 90 tablet 3  . Fluticasone-Salmeterol (ADVAIR DISKUS) 250-50 MCG/DOSE AEPB Inhale 1 puff into the lungs every 12 (twelve) hours. 60 each 0  . furosemide (LASIX) 40 MG tablet TAKE 1 TABLET BY MOUTH  DAILY 30 tablet 0  . ketoconazole (NIZORAL) 2 % cream Apply 1 application topically 2 (two) times daily. (Patient taking differently: Apply 1  application topically daily as needed for irritation. ) 30 g 4  . magnesium 30 MG tablet Take 30 mg by mouth 2 (two) times daily.    . metoprolol succinate (TOPROL-XL) 100 MG 24 hr tablet Take 1 tablet (100 mg total) by mouth 2 (two) times daily. Take with or immediately following a meal. 180 tablet 3  . nitroGLYCERIN (NITROSTAT) 0.4 MG SL tablet Place 1 tablet (0.4 mg total) under the tongue every 5 (five) minutes x 3 doses as needed for chest pain. 75 tablet 1  . oxybutynin (DITROPAN) 5 MG tablet Take 0.5 tablets (2.5 mg total) by mouth 2 (two) times daily as needed for bladder spasms. 45 tablet 3  . pantoprazole (PROTONIX) 40 MG tablet Take 1 tablet (40 mg total) by mouth daily. 90 tablet 3  . Potassium Chloride ER 20 MEQ TBCR Take 1 tablet by mouth daily. 90 tablet 1  . PROCTOZONE-HC 2.5 % rectal cream Apply 3 times daily (Patient  taking differently: Apply rectally as needed for hemorrhoids) 90 g 3  . rivaroxaban (XARELTO) 20 MG TABS tablet Take 1 tablet (20 mg total) by mouth daily with supper. 90 tablet 3  . tadalafil (CIALIS) 5 MG tablet Take 1 tablet (5 mg total) by mouth daily as needed for erectile dysfunction. 10 tablet 0  . triamcinolone cream (KENALOG) 0.1 % Apply 1 application topically 2 (two) times daily. Prn rash; use up to 2 weeks 30 g 0    LABS/IMAGING: Results for orders placed or performed in visit on 04/27/16 (from the past 48 hour(s))  Basic Metabolic Panel (BMET)     Status: Abnormal   Collection Time: 04/27/16 11:29 AM  Result Value Ref Range   Glucose 109 (H) 65 - 99 mg/dL   BUN 14 6 - 24 mg/dL   Creatinine, Ser 1.07 0.76 - 1.27 mg/dL   GFR calc non Af Amer 79 >59 mL/min/1.73   GFR calc Af Amer 92 >59 mL/min/1.73   BUN/Creatinine Ratio 13 9 - 20   Sodium 139 134 - 144 mmol/L   Potassium 4.5 3.5 - 5.2 mmol/L   Chloride 104 96 - 106 mmol/L   CO2 29 18 - 29 mmol/L   Calcium 9.4 8.7 - 10.2 mg/dL  Magnesium     Status: None   Collection Time: 04/27/16 11:29 AM  Result Value Ref Range   Magnesium 2.1 1.6 - 2.3 mg/dL   No results found.  VITALS: Vitals:   04/27/16 1937  BP: 133/71  Pulse: (!) 106  Temp: 98.3 F (36.8 C)    EXAM: General appearance: alert, no distress and moderately obese Neck: no carotid bruit and no JVD Lungs: clear to auscultation bilaterally Heart: irregularly irregular rhythm Abdomen: soft, non-tender; bowel sounds normal; no masses,  no organomegaly and obese Extremities: extremities normal, atraumatic, no cyanosis or edema Pulses: 2+ and symmetric Skin: Skin color, texture, turgor normal. No rashes or lesions Neurologic: Grossly normal Psych: Pleasant  IMPRESSION: Principal Problem:   A-fib (HCC) Active Problems:   Morbid obesity ([place])   Secondary cardiomyopathy ([place])   Essential hypertension, benign   Obstructive sleep apnea   PLAN: 1. Initiate  Tikosyn loading as per protocol with pharmacy. Cardiac electrophysiology to follow patient while admitted.  Pixie Casino, MD, [facility] Attending Cardiologist [place] [facility] [place] 04/27/2016, 7:49 PM

## 2016-04-27 NOTE — Progress Notes (Signed)
Pharmacy Review for Dofetilide (Tikosyn) Initiation  Admit Complaint: 51 y.o. male admitted 04/27/2016 with atrial fibrillation to be initiated on dofetilide.   Assessment:  Patient Exclusion Criteria: If any screening criteria checked as "Yes", then  patient  should NOT receive dofetilide until criteria item is corrected. If "Yes" please indicate correction plan.  YES  NO Patient  Exclusion Criteria Correction Plan  [x]  []  Baseline QTc interval is greater than or equal to 440 msec. IF above YES box checked dofetilide contraindicated unless patient has ICD; then may proceed if QTc 500-550 msec or with known ventricular conduction abnormalities may proceed with QTc 550-600 msec. QTc =  459, documented by outpatient pharmacist, ok with MD   []  [x]  Magnesium level is less than 1.8 mEq/l : Last magnesium:  Lab Results  Component Value Date   MG 2.1 04/27/2016         []  [x]  Potassium level is less than 4 mEq/l : Last potassium:  Lab Results  Component Value Date   K 4.5 04/27/2016         []  [x]  Patient is known or suspected to have a digoxin level greater than 2 ng/ml: No results found for: DIGOXIN    []  [x]  Creatinine clearance less than 20 ml/min (calculated using Cockcroft-Gault, actual body weight and serum creatinine): Estimated Creatinine Clearance: 111 mL/min (by C-G formula based on SCr of 1.07 mg/dL).    []  [x]  Patient has received drugs known to prolong the QT intervals within the last 48 hours (phenothiazines, tricyclics or tetracyclic antidepressants, erythromycin, H-1 antihistamines, cisapride, fluoroquinolones, azithromycin). Drugs not listed above may have an, as yet, undetected potential to prolong the QT interval, updated information on QT prolonging agents is available at this website:QT prolonging agents   []  [x]  Patient received a dose of hydrochlorothiazide (Oretic) alone or in any combination including triamterene (Dyazide, Maxzide) in the last 48 hours.   []  [x]   Patient received a medication known to increase dofetilide plasma concentrations prior to initial dofetilide dose:  . Trimethoprim (Primsol, Proloprim) in the last 36 hours . Verapamil (Calan, Verelan) in the last 36 hours or a sustained release dose in the last 72 hours . Megestrol (Megace) in the last 5 days  . Cimetidine (Tagamet) in the last 6 hours . Ketoconazole (Nizoral) in the last 24 hours . Itraconazole (Sporanox) in the last 48 hours  . Prochlorperazine (Compazine) in the last 36 hours    []  [x]  Patient is known to have a history of torsades de pointes; congenital or acquired long QT syndromes.   []  [x]  Patient has received a Class 1 antiarrhythmic with less than 2 half-lives since last dose. (Disopyramide, Quinidine, Procainamide, Lidocaine, Mexiletine, Flecainide, Propafenone)   []  [x]  Patient has received amiodarone therapy in the past 3 months or amiodarone level is greater than 0.3 ng/ml.    Patient has been appropriately anticoagulated with Xarelto  Ordering provider was confirmed at [email or web address] if they are not listed on the [place] Prescribers list.  Goal of Therapy: Follow renal function, electrolytes, potential drug interactions, and dose adjustment. Provide education and 1 week supply at discharge.  Plan:  [x]   Physician selected initial dose within range recommended for patients level of renal function - will monitor for response.  []   Physician selected initial dose outside of range recommended for patients level of renal function - will discuss if the dose should be altered at this time.   Select One Calculated CrCl  Dose q12h  [  x] > 60 ml/min 500 mcg  []  40-60 ml/min 250 mcg  []  20-40 ml/min 125 mcg   2. Follow up QTc after the first 5 doses, renal function, electrolytes (K & Mg) daily x 3     days, dose adjustment, success of initiation and facilitate 1 week discharge supply as     clinically indicated.  3. Initiate Tikosyn education video  (Call [phone] and ask for Tikosyn Video # 116).  4. Place Enrollment Form on the chart for discharge supply of dofetilide.  Raevon Broom S. Alford Highland, PharmD, BCPS Clinical Staff Pharmacist Pager [phone]  Eilene Ghazi Stillinger 8:57 PM 04/27/2016

## 2016-04-28 LAB — MAGNESIUM: Magnesium: 2.1 mg/dL (ref 1.7–2.4)

## 2016-04-28 LAB — BASIC METABOLIC PANEL
ANION GAP: 7 (ref 5–15)
BUN: 15 mg/dL (ref 6–20)
CHLORIDE: 107 mmol/L (ref 101–111)
CO2: 27 mmol/L (ref 22–32)
Calcium: 9 mg/dL (ref 8.9–10.3)
Creatinine, Ser: 1.19 mg/dL (ref 0.61–1.24)
GFR calc Af Amer: >60 mL/min (ref >60)
GLUCOSE: 114 mg/dL — AB (ref 65–99)
POTASSIUM: 4.2 mmol/L (ref 3.5–5.1)
Sodium: 141 mmol/L (ref 135–145)

## 2016-04-28 MED ORDER — ONDANSETRON HCL 4 MG/2ML IJ SOLN: 4.0000 mg | Freq: Four times a day (QID) | INTRAMUSCULAR | Status: DC | PRN

## 2016-04-28 MED ORDER — ACETAMINOPHEN 325 MG PO TABS
650.0000 mg | ORAL_TABLET | ORAL | Status: DC | PRN
Start: 2016-04-28 — End: 2016-04-30
  Administered 2016-04-28 – 2016-04-29 (×3): 650 mg via ORAL
  Filled 2016-04-28 (×3): qty 2

## 2016-04-28 NOTE — Progress Notes (Signed)
    SUBJECTIVE: The patient is doing well today.  At this time, he denies chest pain, shortness of breath, or any new concerns.  CURRENT MEDICATIONS: . atorvastatin  10 mg Oral Daily  . cholecalciferol  2,000 Units Oral Daily  . clopidogrel  75 mg Oral Q breakfast  . dofetilide  500 mcg Oral BID  . enalapril  10 mg Oral Daily  . furosemide  40 mg Oral Daily  . magnesium oxide  200 mg Oral BID  . metoprolol succinate  100 mg Oral BID  . mometasone-formoterol  2 puff Inhalation BID  . pantoprazole  40 mg Oral Daily  . pneumococcal 23 valent vaccine  0.5 mL Intramuscular Tomorrow-1000  . potassium chloride  20 mEq Oral Daily  . rivaroxaban  20 mg Oral Q supper  . sodium chloride flush  3 mL Intravenous Q12H     OBJECTIVE: Physical Exam: Vitals:   04/27/16 1937 04/28/16 0634  BP: 133/71   Pulse: (!) 106   Resp: 20   Temp: 98.3 F (36.8 C) 98 F (36.7 C)  TempSrc: Oral Oral  SpO2: 97% 98%  Weight: 271 lb 6.4 oz (123.1 kg) 270 lb 11.2 oz (122.8 kg)  Height: 6\' 1"  (1.854 m)     Intake/Output Summary (Last 24 hours) at 04/28/16 0831 Last data filed at 04/27/16 2052  Gross per 24 hour  Intake              240 ml  Output                0 ml  Net              240 ml    Telemetry reveals atrial fibrillation with reversion to sinus rhythm  GEN- The patient is obese appearing, alert and oriented x 3 today.   Head- normocephalic, atraumatic Eyes-  Sclera clear, conjunctiva pink Ears- hearing intact Oropharynx- clear Neck- supple  Lungs- Clear to ausculation bilaterally, normal work of breathing Heart- Regular rate and rhythm  GI- soft, NT, ND, + BS Extremities- no clubbing, cyanosis, or edema Skin- no rash or lesion Psych- euthymic mood, full affect Neuro- strength and sensation are intact  LABS: Basic Metabolic Panel:  Recent Labs  04/27/16 2039 04/28/16 0419  NA 139 141  K 4.2 4.2  CL 105 107  CO2 23 27  GLUCOSE 140* 114*  BUN 15 15  CREATININE 1.22 1.19   CALCIUM 9.1 9.0  MG 2.1 2.1    ASSESSMENT AND PLAN:  Principal Problem:   A-fib (HCC) Active Problems:   Morbid obesity ([place])   Secondary cardiomyopathy ([place])   Essential hypertension, benign   Obstructive sleep apnea   Persistent atrial fibrillation ([place])   1.  Persistent atrial fibrillation Converted to SR after 1st dose of Tikosyn Keep K>3.9, Mg >1.8 QTc stable this morning Continue Xarelto for CHADS2VASC of 0  2.  CAD No recent ischemic symptoms Continue medical therapy  3.  OSA Compliant with CPAP  4.  Obesity Body mass index is 35.71 kg/m. Weight loss advised   Chanetta Marshall, NP 04/28/2016 8:33 AM  I have seen, examined the patient, and reviewed the above assessment and plan.  On exam, RRR.  Changes to above are made where necessary.  He has converted to sinus.  QT is stable  Co Sign: Thompson Grayer, MD 04/28/2016 3:04 PM

## 2016-04-28 NOTE — Progress Notes (Signed)
Pt QTC pre tikosyn initiation 484.  Post tikosyn QTC 510.  Dr. Cheri Fowler notified.  Will continue to monitor.

## 2016-04-28 NOTE — Progress Notes (Signed)
Pt converted to NSR.  EKG obtained.  Will continue to monitor.

## 2016-04-29 LAB — BASIC METABOLIC PANEL
ANION GAP: 10 (ref 5–15)
BUN: 14 mg/dL (ref 6–20)
CALCIUM: 9.4 mg/dL (ref 8.9–10.3)
CO2: 27 mmol/L (ref 22–32)
Chloride: 102 mmol/L (ref 101–111)
Creatinine, Ser: 1.08 mg/dL (ref 0.61–1.24)
GLUCOSE: 114 mg/dL — AB (ref 65–99)
POTASSIUM: 4.4 mmol/L (ref 3.5–5.1)
Sodium: 139 mmol/L (ref 135–145)

## 2016-04-29 LAB — MAGNESIUM: MAGNESIUM: 2.2 mg/dL (ref 1.7–2.4)

## 2016-04-29 NOTE — Discharge Instructions (Signed)

## 2016-04-29 NOTE — Care Management Note (Addendum)
Case Management Note  Patient Details  Name: Austin Woods MRN: [id number] Date of Birth: May 23, 1965  Subjective/Objective:   Pt presented for Tikosyn Load. Pt is from home.  Plan to return home once stable. Benefits Check completed and pt is aware of cost. Pt uses [facility] in [place] and pharmacy is not able to order medication. Pt is calling [facility] in regards to medication mail order and how long it will take him to get since [facility] will not order. Pt will need a 90 day Rx e-scribed to [facility] once stable for d/c. Hopefully pt will get medication as soon as possible.                  TIME 4.30 PM S/W  MIYA  @ [facility] # [phone]  PATIENT NEEDS UPDATE INS INFO WITH ADMISSION.   TIKOSYN 500 MCG BID  COVER- YES  CO-PAY- $ 100.00  TIER- 3 DRUG  PRIOR APPROVAL- NO   DOFETILIDE 500 MC BID  COVER- YES  CO-PAY- ZERO DOLLARS  PRIOR APPROVAL- NO   PHARMACY: [facility]   Action/Plan: Please write Rx for 7 day supply of Tikosyn no refills. CM will assist with pt getting 7 day supply before d/c. No further needs from CM at this time.    Expected Discharge Date:  04/30/16               Expected Discharge Plan:  Home/Self Care  In-House Referral:  NA  Discharge planning Services  CM Consult  Post Acute Care Choice:  NA Choice offered to:  NA  DME Arranged:  N/A DME Agency:  NA  HH Arranged:  NA HH Agency:  NA  Status of Service:  Completed, signed off  If discussed at [place] of Stay Meetings, dates discussed:    Additional Comments:  Bethena Roys, RN 04/29/2016, 2:34 PM

## 2016-04-29 NOTE — Discharge Summary (Signed)
ELECTROPHYSIOLOGY PROCEDURE DISCHARGE SUMMARY    Patient ID: Austin Woods,  MRN: [id number], DOB/AGE: Apr 15, 1965 51 y.o.  Admit date: 04/27/2016 Discharge date: 05/01/15  Primary Care Physician: Mickie Hillier, MD  Primary Cardiologist: Dr. Bronson Ing Electrophysiologist: Dr. Rayann Heman  Primary Discharge Diagnosis:  1.  Persistent atrial fibrillation status post Tikosyn loading this admission      CHA2DS2Vasc is 2, on Xarelto  Secondary Discharge Diagnosis:  1. HTN 2. CAD     Last PTCA 02/03/16, on Plavix (with Xarelto) 3. HLD       No Known Allergies   Procedures This Admission:  1.  Tikosyn loading   Brief HPI: Austin Woods is a 51 y.o. male with a past medical history as noted above.  Risks, benefits, and alternatives to Tikosyn were reviewed with the patient who wished to proceed.     Hospital Course:  The patient was admitted and Tikosyn was initiated.  Renal function and electrolytes were followed during the hospitalization.  His QTc remained stable.  He was monitored until discharge on telemetry which demonstrated SB/SR.  On the day of discharge, he was examined by Dr Rayann Heman who considered him stable for discharge to home.  We will decrease his metoprolol to once daily with bradycardic rates observed, mostly overnight hours, though the patient reported being awake and noted the slow rate making him anxious about it.  Follow-up has been arranged with the AFib clinic for labs, EKG, visit in 1 week and with Dr Rayann Heman in 4 weeks.   Physical Exam: Vitals:   04/29/16 2122 04/29/16 2217 04/30/16 0500 04/30/16 1054  BP:   (!) 107/52   Pulse:  62 65   Resp:   18   Temp:   98.4 F (36.9 C)   TempSrc:   Oral   SpO2: 95%  97% 98%  Weight:   266 lb (120.7 kg)   Height:         GEN- The patient is well appearing, alert and oriented x 3 today.   HEENT: normocephalic, atraumatic; sclera clear, conjunctiva pink; hearing intact; oropharynx clear; neck supple, no  JVP Lymph- no cervical lymphadenopathy Lungs- CTA b/l , normal work of breathing.  No wheezes, rales, rhonchi Heart- RRR, no murmurs, rubs or gallops, PMI not laterally displaced GI- soft, non-tender, non-distended Extremities- no clubbing, cyanosis, no edema MS- no significant deformity or atrophy Skin- warm and dry, no rash or lesion Psych- euthymic mood, full affect Neuro- strength and sensation are intact   Labs:   Lab Results  Component Value Date   WBC 10.0 04/14/2016   HGB 16.4 04/14/2016   HCT 48.1 04/14/2016   MCV 93.8 04/14/2016   PLT 257 04/14/2016     Recent Labs Lab 04/30/16 0457  NA 139  K 4.7  CL 101  CO2 29  BUN 17  CREATININE 1.21  CALCIUM 9.5  GLUCOSE 112*     Discharge Medications:  Allergies as of 04/30/2016   No Known Allergies     Medication List    TAKE these medications   acetaminophen 500 MG tablet Commonly known as:  TYLENOL Take 1,000 mg by mouth every 6 (six) hours as needed for headache.   albuterol 108 (90 Base) MCG/ACT inhaler Commonly known as:  PROVENTIL HFA Inhale 2 puffs into the lungs every 4 (four) hours as needed for wheezing or shortness of breath.   atorvastatin 10 MG tablet Commonly known as:  LIPITOR Take 1 tablet (10 mg total)  by mouth daily. What changed:  when to take this   clopidogrel 75 MG tablet Commonly known as:  PLAVIX Take 1 tablet (75 mg total) by mouth daily with breakfast.   dofetilide 500 MCG capsule Commonly known as:  TIKOSYN Take 1 capsule (500 mcg total) by mouth 2 (two) times daily.   enalapril 10 MG tablet Commonly known as:  VASOTEC Take 1 tablet (10 mg total) by mouth daily.   Fluticasone-Salmeterol 250-50 MCG/DOSE Aepb Commonly known as:  ADVAIR DISKUS Inhale 1 puff into the lungs every 12 (twelve) hours.   furosemide 40 MG tablet Commonly known as:  LASIX TAKE 1 TABLET BY MOUTH  DAILY   ketoconazole 2 % cream Commonly known as:  NIZORAL Apply 1 application topically 2  (two) times daily. What changed:  when to take this  reasons to take this   MAGNESIUM PO Take 1 tablet by mouth daily.   metoprolol succinate 100 MG 24 hr tablet Commonly known as:  TOPROL-XL Take 1 tablet (100 mg total) by mouth daily. Take with or immediately following a meal. Start taking on:  05/01/2016 What changed:  when to take this   nitroGLYCERIN 0.4 MG SL tablet Commonly known as:  NITROSTAT Place 1 tablet (0.4 mg total) under the tongue every 5 (five) minutes x 3 doses as needed for chest pain.   oxybutynin 5 MG tablet Commonly known as:  DITROPAN Take 0.5 tablets (2.5 mg total) by mouth 2 (two) times daily as needed for bladder spasms. What changed:  how much to take  when to take this   pantoprazole 40 MG tablet Commonly known as:  PROTONIX Take 1 tablet (40 mg total) by mouth daily.   Potassium Chloride ER 20 MEQ Tbcr Take 1 tablet by mouth daily.   PROCTOZONE-HC 2.5 % rectal cream Generic drug:  hydrocortisone Apply 3 times daily What changed:  See the new instructions.   rivaroxaban 20 MG Tabs tablet Commonly known as:  XARELTO Take 1 tablet (20 mg total) by mouth daily with supper.   tadalafil 5 MG tablet Commonly known as:  CIALIS Take 1 tablet (5 mg total) by mouth daily as needed for erectile dysfunction.   triamcinolone cream 0.1 % Commonly known as:  KENALOG Apply 1 application topically 2 (two) times daily. Prn rash; use up to 2 weeks   Vitamin D 2000 units Caps Take 2,000 Units by mouth daily.       Disposition:  Home  Discharge Instructions    Diet - low sodium heart healthy    Complete by:  As directed    Increase activity slowly    Complete by:  As directed      Follow-up Information    [facility] Follow up on 05/07/2016.   Specialty:  Cardiology Why:  2:00PM Contact information: [address] [id number] mc [place] [phone]       Thompson Grayer, MD  Follow up on 05/29/2016.   Specialty:  Cardiology Why:  8:45AM Contact information: [place] [address] [phone]           Duration of Discharge Encounter: Greater than 30 minutes including physician time.  Signed, Tommye Standard, PA-C 04/30/2016 1:51 PM  I have seen, examined the patient, and reviewed the above assessment and plan.  Changes to above are made where necessary.    Co Sign: Thompson Grayer, MD 04/30/2016

## 2016-04-29 NOTE — Progress Notes (Signed)
SUBJECTIVE: The patient is doing well today.  At this time, he denies chest pain, shortness of breath, or any new concerns.  CURRENT MEDICATIONS: . atorvastatin  10 mg Oral Daily  . cholecalciferol  2,000 Units Oral Daily  . clopidogrel  75 mg Oral Q breakfast  . dofetilide  500 mcg Oral BID  . enalapril  10 mg Oral Daily  . furosemide  40 mg Oral Daily  . magnesium oxide  200 mg Oral BID  . metoprolol succinate  100 mg Oral BID  . mometasone-formoterol  2 puff Inhalation BID  . pantoprazole  40 mg Oral Daily  . pneumococcal 23 valent vaccine  0.5 mL Intramuscular Tomorrow-1000  . potassium chloride  20 mEq Oral Daily  . rivaroxaban  20 mg Oral Q supper  . sodium chloride flush  3 mL Intravenous Q12H     OBJECTIVE: Physical Exam: Vitals:   04/28/16 2156 04/28/16 2200 04/29/16 0616 04/29/16 0753  BP: 114/71 114/71 119/76   Pulse: 65  (!) 51   Resp:      Temp: 97.9 F (36.6 C)  98.1 F (36.7 C)   TempSrc: Oral  Oral   SpO2: 98%  99% 97%  Weight:   267 lb 9.6 oz (121.4 kg)   Height:        Intake/Output Summary (Last 24 hours) at 04/29/16 0803 Last data filed at 04/28/16 2200  Gross per 24 hour  Intake              720 ml  Output                0 ml  Net              720 ml    Telemetry reveals is SB/SR, 49-50's overnight, 60's daytime  GEN- The patient is obese appearing, alert and oriented x 3 today.   Head- normocephalic, atraumatic Eyes-  Sclera clear, conjunctiva pink Ears- hearing intact Oropharynx- clear Neck- supple  Lungs- Clear to ausculation bilaterally, normal work of breathing Heart- Regular rate and rhythm  GI- soft, NT, ND Extremities- no clubbing, cyanosis, or edema Skin- no rash or lesion Psych- euthymic mood, full affect Neuro- no gross deficits appreciated  LABS: Basic Metabolic Panel:  Recent Labs  04/28/16 0419 04/29/16 0529  NA 141 139  K 4.2 4.4  CL 107 102  CO2 27 27  GLUCOSE 114* 114*  BUN 15 14  CREATININE 1.19  1.08  CALCIUM 9.0 9.4  MG 2.1 2.2    ASSESSMENT AND PLAN:  Principal Problem:   A-fib (HCC) Active Problems:   Morbid obesity ([place])   Secondary cardiomyopathy ([place])   Essential hypertension, benign   Obstructive sleep apnea   Persistent atrial fibrillation ([place])   1.  Persistent atrial fibrillation/Tikosyn initiation S/p 3 doses Keep K>3.9, Mg >1.8 Remains in SR QTc remains stable this morning Renal function stable for current dose Continue Xarelto for CHADS2VASC of 0 Pending case management/Tikosyn coverage Anticipate discharge tomorrow after his 6th dose  2.  CAD No recent ischemic symptoms Continue medical therapy  3.  OSA Compliant with CPAP  4.  Obesity Body mass index is 35.31 kg/m. Weight loss has been advised   Tommye Standard, PA-C 04/29/2016 8:03 AM  I have seen, examined the patient, and reviewed the above assessment and plan.  On exam RRR.  Changes to above are made where necessary.   Remains in sinus.  QTc is stable.  Anticipate discharge  to home tomorrow if no concerns.  Co Sign: Thompson Grayer, MD 04/29/2016 8:55 AM

## 2016-04-30 LAB — BASIC METABOLIC PANEL
ANION GAP: 9 (ref 5–15)
BUN: 17 mg/dL (ref 6–20)
CHLORIDE: 101 mmol/L (ref 101–111)
CO2: 29 mmol/L (ref 22–32)
Calcium: 9.5 mg/dL (ref 8.9–10.3)
Creatinine, Ser: 1.21 mg/dL (ref 0.61–1.24)
GFR calc non Af Amer: >60 mL/min (ref >60)
Glucose, Bld: 112 mg/dL — ABNORMAL HIGH (ref 65–99)
POTASSIUM: 4.7 mmol/L (ref 3.5–5.1)
Sodium: 139 mmol/L (ref 135–145)

## 2016-04-30 LAB — MAGNESIUM: Magnesium: 2.1 mg/dL (ref 1.7–2.4)

## 2016-04-30 MED ORDER — METOPROLOL SUCCINATE ER 100 MG PO TB24: 100.0000 mg | ORAL_TABLET | Freq: Every day | ORAL | 3 refills | Status: DC

## 2016-04-30 MED ORDER — METOPROLOL SUCCINATE ER 100 MG PO TB24
100.0000 mg | ORAL_TABLET | Freq: Every day | ORAL | Status: DC
Start: 2016-04-30 — End: 2016-04-30
  Administered 2016-04-30: 100 mg via ORAL
  Filled 2016-04-30: qty 1

## 2016-04-30 MED ORDER — DOFETILIDE 500 MCG PO CAPS: 500.0000 ug | ORAL_CAPSULE | Freq: Two times a day (BID) | ORAL | 3 refills | Status: DC

## 2016-04-30 NOTE — Progress Notes (Signed)
Maintaining sinus rhythm Asymptomatic sinus bradycardia while sleeping No other concerns Will reduce toprol to 100mg  daily Continue tikosyn with close outpatient follow-up  DC to home today  Thompson Grayer MD, [facility] 04/30/2016 10:18 AM

## 2016-05-07 ENCOUNTER — Encounter (HOSPITAL_COMMUNITY): Payer: Self-pay | Admitting: Nurse Practitioner

## 2016-05-07 ENCOUNTER — Ambulatory Visit (HOSPITAL_BASED_OUTPATIENT_CLINIC_OR_DEPARTMENT_OTHER)
Admission: RE | Admit: 2016-05-07 | Discharge: 2016-05-07 | Disposition: A | Discharge status: Home / Self Care | Payer: BLUE CROSS/BLUE SHIELD | Source: Ambulatory Visit | Attending: Nurse Practitioner | Admitting: Nurse Practitioner

## 2016-05-07 ENCOUNTER — Ambulatory Visit (INDEPENDENT_AMBULATORY_CARE_PROVIDER_SITE_OTHER)
Admission: RE | Admit: 2016-05-07 | Discharge: 2016-05-07 | Disposition: A | Discharge status: Home / Self Care | Payer: BLUE CROSS/BLUE SHIELD | Source: Ambulatory Visit | Attending: Surgery | Admitting: Surgery

## 2016-05-07 VITALS — BP 120/80 | HR 73 | Ht 73.0 in | Wt 276.0 lb

## 2016-05-07 DIAGNOSIS — I729 Aneurysm of unspecified site: Secondary | ICD-10-CM | POA: Diagnosis not present

## 2016-05-07 DIAGNOSIS — T81718A Complication of other artery following a procedure, not elsewhere classified, initial encounter: Secondary | ICD-10-CM

## 2016-05-07 DIAGNOSIS — I998 Other disorder of circulatory system: Secondary | ICD-10-CM | POA: Diagnosis not present

## 2016-05-07 DIAGNOSIS — I481 Persistent atrial fibrillation: Secondary | ICD-10-CM | POA: Diagnosis not present

## 2016-05-07 DIAGNOSIS — I4819 Other persistent atrial fibrillation: Secondary | ICD-10-CM

## 2016-05-07 LAB — BASIC METABOLIC PANEL
Anion gap: 12 (ref 5–15)
BUN: 12 mg/dL (ref 6–20)
CHLORIDE: 102 mmol/L (ref 101–111)
CO2: 26 mmol/L (ref 22–32)
CREATININE: 1.09 mg/dL (ref 0.61–1.24)
Calcium: 9.2 mg/dL (ref 8.9–10.3)
GFR calc Af Amer: >60 mL/min (ref >60)
GFR calc non Af Amer: >60 mL/min (ref >60)
Glucose, Bld: 122 mg/dL — ABNORMAL HIGH (ref 65–99)
Potassium: 4.1 mmol/L (ref 3.5–5.1)
Sodium: 140 mmol/L (ref 135–145)

## 2016-05-07 LAB — MAGNESIUM: Magnesium: 1.9 mg/dL (ref 1.7–2.4)

## 2016-05-07 NOTE — Progress Notes (Signed)
Primary Care Physician: Mickie Hillier, MD EP Physician: Dr. Rayann Heman Cardiologist: Dr. Cecile Sheerer is a 51 y.o. male with a h/o afib with third ablation performed 02/18/16. He had cardioversion 10/26, and c/o of chest pain after the procedure. He was admitted and underwent cardiac catherization 02/03/16 and received a stent of the LAD.   He was seen couple of weeks after ablation, due to continued pain of rt groin subsequent to hematoma at time of procedure and feeling that the pain at the groin would keep him from doing his job. He was sent for u/s of rt groin.  He was also in rapid afib at 138 bpm, but he was unaware of this. He has not been checking his HR/BP at home and did not know if the afib was persistent.  The continued groin discomfort is more of a concern to him right now.  F/u 12/22, since being seen in the clinic, he underwent treatment of pseudoaneurysm of rt groin which is still not resolved and will f/u with Dr. Trula Slade for repeat u/s today. He continued in afib and had unsuccessful cardioversion 04/14/16.   He was [facility] to [facility] 1/22 thru 1/25 for tikosyn and is maintaining SR and in the afib clinic, 2/1, for f/u. He feels improved.  Today, he denies symptoms of   chest pain , orthopnea, PND, lower extremity edema, dizziness, presyncope, syncope, or neurologic sequela. Positive for shortness of breath, fatigue, anxious feeling. The patient is tolerating medications without difficulties and is otherwise without complaint today.   Past Medical History:  Diagnosis Date  . Asthma   . GERD (gastroesophageal reflux disease)   . Headache   . High cholesterol   . History of DVT (deep vein thrombosis)    a. left leg following ablation for SVT  . Hypertension   . Persistent atrial fibrillation ([place])    a. Dx 08/2012, xarelto initiated; b. 09/2012 Tikosyn initiated -> DCCV -> Sinus c. PVI 03/2013 d. PVI 03/2014  . Sleep apnea    does not use CPAP (he reports  having uvulectomy)   . Wolff-Parkinson-White (WPW) syndrome    a. s/p RFCA 2001 by Dr Caryl Comes, pt reports complicated by post procedure DVT   Past Surgical History:  Procedure Laterality Date  . ATRIAL FIBRILLATION ABLATION  03/23/2013   PVI by DR Rayann Heman for afib  . ATRIAL FIBRILLATION ABLATION N/A 03/23/2013   Procedure: ATRIAL FIBRILLATION ABLATION;  Surgeon: Coralyn Mark, MD;  Location: [place] CATH LAB;  Service: Cardiovascular;  Laterality: N/A;  . ATRIAL FIBRILLATION ABLATION N/A 03/27/2014   PVI Dr Rayann Heman  . BACK SURGERY    . CARDIAC CATHETERIZATION N/A 02/03/2016   Procedure: Left Heart Cath and Coronary Angiography;  Surgeon: Nelva Bush, MD;  Location: [place] CV LAB;  Service: Cardiovascular;  Laterality: N/A;  . CARDIAC CATHETERIZATION N/A 02/03/2016   Procedure: Intravascular Pressure Wire/FFR Study;  Surgeon: Nelva Bush, MD;  Location: [place] CV LAB;  Service: Cardiovascular;  Laterality: N/A;  . CARDIAC CATHETERIZATION N/A 02/03/2016   Procedure: Coronary Stent Intervention;  Surgeon: Nelva Bush, MD;  Location: [place] CV LAB;  Service: Cardiovascular;  Laterality: N/A;  . CARDIAC SURGERY    . CARDIOVERSION N/A 08/25/2012   Procedure: CARDIOVERSION;  Surgeon: Thayer Headings, MD;  Location: [facility] ENDOSCOPY;  Service: Cardiovascular;  Laterality: N/A;  . CARDIOVERSION N/A 09/06/2012   Procedure: CARDIOVERSION/Bedside;  Surgeon: Carlena Bjornstad, MD;  Location: [place];  Service:  Cardiovascular;  Laterality: N/A;  . CARDIOVERSION N/A 01/12/2014   Procedure: CARDIOVERSION;  Surgeon: Pixie Casino, MD;  Location: [facility] ENDOSCOPY;  Service: Cardiovascular;  Laterality: N/A;  . CARDIOVERSION N/A 01/30/2016   Procedure: CARDIOVERSION;  Surgeon: Sueanne Margarita, MD;  Location: [facility] ENDOSCOPY;  Service: Cardiovascular;  Laterality: N/A;  . CARDIOVERSION N/A 04/14/2016   Procedure: CARDIOVERSION;  Surgeon: Pixie Casino, MD;  Location: [facility] ENDOSCOPY;  Service: Cardiovascular;   Laterality: N/A;  . COLONOSCOPY N/A 12/04/2015   Procedure: COLONOSCOPY;  Surgeon: Rogene Houston, MD;  Location[facility] ENDO [facility];  Service: Endoscopy;  Laterality: N/A;  730  . ELECTROPHYSIOLOGIC STUDY N/A 02/18/2016   Procedure: Atrial Fibrillation Ablation;  Surgeon: Thompson Grayer, MD;  Location: [place] CV LAB;  Service: Cardiovascular;  Laterality: N/A;  . LOOP RECORDER IMPLANT N/A 06/29/2014   Procedure: LOOP RECORDER IMPLANT;  Surgeon: Thompson Grayer, MD;  Location: [facility] CATH LAB;  Service: Cardiovascular;  Laterality: N/A;  . NOSE SURGERY     sleep apnea surgery  . PERIPHERAL VASCULAR CATHETERIZATION N/A 03/13/2016   Procedure: Thrombin Injection;  Surgeon: Serafina Mitchell, MD;  Location: [place] CV LAB;  Service: Cardiovascular;  Laterality: N/A;  . STOMACH SURGERY     reflux, fundoplication  . TEE WITHOUT CARDIOVERSION N/A 08/25/2012   Procedure: TRANSESOPHAGEAL ECHOCARDIOGRAM (TEE);  Surgeon: Thayer Headings, MD;  Location: [place];  Service: Cardiovascular;  Laterality: N/A;  . TEE WITHOUT CARDIOVERSION  03/23/2013   DR ROSS  . TEE WITHOUT CARDIOVERSION N/A 03/23/2013   Procedure: TRANSESOPHAGEAL ECHOCARDIOGRAM (TEE);  Surgeon: Fay Records, MD;  Location: [facility] ENDOSCOPY;  Service: Cardiovascular;  Laterality: N/A;  . TEE WITHOUT CARDIOVERSION N/A 03/26/2014   Procedure: TRANSESOPHAGEAL ECHOCARDIOGRAM (TEE);  Surgeon: Josue Hector, MD;  Location: [facility] ENDOSCOPY;  Service: Cardiovascular;  Laterality: N/A;  . TONSILLECTOMY      Current Outpatient Prescriptions  Medication Sig Dispense Refill  . acetaminophen (TYLENOL) 500 MG tablet Take 1,000 mg by mouth every 6 (six) hours as needed for headache.    . albuterol (PROVENTIL HFA) 108 (90 Base) MCG/ACT inhaler Inhale 2 puffs into the lungs every 4 (four) hours as needed for wheezing or shortness of breath. 1 Inhaler 0  . atorvastatin (LIPITOR) 10 MG tablet Take 1 tablet (10 mg total) by mouth daily. (Patient taking differently:  Take 10 mg by mouth daily at 6 PM. ) 30 tablet 0  . Cholecalciferol (VITAMIN D) 2000 UNITS CAPS Take 2,000 Units by mouth daily.     . clopidogrel (PLAVIX) 75 MG tablet Take 1 tablet (75 mg total) by mouth daily with breakfast. 90 tablet 3  . dofetilide (TIKOSYN) 500 MCG capsule Take 1 capsule (500 mcg total) by mouth 2 (two) times daily. 60 capsule 3  . enalapril (VASOTEC) 10 MG tablet Take 1 tablet (10 mg total) by mouth daily. 90 tablet 3  . Fluticasone-Salmeterol (ADVAIR DISKUS) 250-50 MCG/DOSE AEPB Inhale 1 puff into the lungs every 12 (twelve) hours. 60 each 0  . furosemide (LASIX) 40 MG tablet TAKE 1 TABLET BY MOUTH  DAILY 30 tablet 0  . ketoconazole (NIZORAL) 2 % cream Apply 1 application topically 2 (two) times daily. (Patient taking differently: Apply 1 application topically daily as needed for irritation. ) 30 g 4  . MAGNESIUM PO Take 1 tablet by mouth daily.    . metoprolol succinate (TOPROL-XL) 100 MG 24 hr tablet Take 1 tablet (100 mg total) by mouth daily. Take with or immediately  following a meal. 30 tablet 3  . nitroGLYCERIN (NITROSTAT) 0.4 MG SL tablet Place 1 tablet (0.4 mg total) under the tongue every 5 (five) minutes x 3 doses as needed for chest pain. 75 tablet 1  . oxybutynin (DITROPAN) 5 MG tablet Take 0.5 tablets (2.5 mg total) by mouth 2 (two) times daily as needed for bladder spasms. (Patient taking differently: Take 5 mg by mouth daily as needed for bladder spasms. ) 45 tablet 3  . pantoprazole (PROTONIX) 40 MG tablet Take 1 tablet (40 mg total) by mouth daily. 90 tablet 3  . Potassium Chloride ER 20 MEQ TBCR Take 1 tablet by mouth daily. 90 tablet 1  . PROCTOZONE-HC 2.5 % rectal cream Apply 3 times daily (Patient taking differently: Apply rectally as needed for hemorrhoids) 90 g 3  . rivaroxaban (XARELTO) 20 MG TABS tablet Take 1 tablet (20 mg total) by mouth daily with supper. 90 tablet 3  . tadalafil (CIALIS) 5 MG tablet Take 1 tablet (5 mg total) by mouth daily as  needed for erectile dysfunction. 10 tablet 0  . triamcinolone cream (KENALOG) 0.1 % Apply 1 application topically 2 (two) times daily. Prn rash; use up to 2 weeks 30 g 0   No current facility-administered medications for this encounter.     No Known Allergies  Social History   Social History  . Marital status: Married    Spouse name: N/A  . Number of children: 2  . Years of education: N/A   Occupational History  .  [employer]   Social History Main Topics  . Smoking status: Former Smoker    Packs/day: 1.00    Years: 7.00    Types: Cigarettes    Start date: 05/19/2003    Quit date: 08/06/2010  . Smokeless tobacco: Never Used  . Alcohol use No  . Drug use: No  . Sexual activity: Not on file   Other Topics Concern  . Not on file   Social History Narrative   Pt lives in [place] with wife.  Works at [employer] History  Problem Relation Age of Onset  . CAD Father 46  . Diabetes Father   . Heart attack Father   . Transient ischemic attack Father   . Vascular Disease Father   . Diabetes Mother   . Anemia Mother   . Gout Mother   . Aneurysm Maternal Grandmother   . Stroke Maternal Grandfather   . Diabetes Paternal Grandmother   . Stroke Paternal Grandfather   . Diabetes Brother     ROS- All systems are reviewed and negative except as per the HPI above  Physical Exam: Vitals:   05/07/16 1355  BP: 120/80  Pulse: 73  SpO2: 97%  Weight: 276 lb (125.2 kg)  Height: 6\' 1"  (1.854 m)    GEN- The patient is well appearing, alert and oriented x 3 today.   Head- normocephalic, atraumatic Eyes-  Sclera clear, conjunctiva pink Ears- hearing intact Oropharynx- clear Neck- supple, no JVP Lymph- no cervical lymphadenopathy Lungs- Clear to ausculation bilaterally, normal work of breathing Heart-  regular rate and rhythm, no murmurs, rubs or gallops, PMI not laterally displaced GI- soft, NT, ND, + BS Extremities- no clubbing, cyanosis, or edema.   MS- no significant  deformity or atrophy Skin- no rash or lesion Psych- euthymic mood, full affect Neuro- strength and sensation are intact  EKG-NSR at 73 bpm, pr int 150 ms, qrs int 82 ms, qtc 469  ms(stable) Epic records reviewed  Assessment and Plan: 1.Persisitent Afib, s/p ablation Failed cardioversion Maintaining SR on tikosyn Continue tikosyn at 500 mcg bid Continue metoprolol No missed doses of xarelto Bmet/mag   2. Rt groin pseudoaneurysm  Per Dr. Trula Slade   3. CAD stable Stent placement of LAD 10/30 Continue plavix  Continue BB, statin Per Dr. Bronson Ing   F/u with Dr. Rayann Heman 2/14 afib clinic as needed   Geroge Baseman. [place], [facility] [address] [place], [place] [address] [phone]

## 2016-05-08 ENCOUNTER — Encounter: Payer: Self-pay | Admitting: Surgery

## 2016-05-10 LAB — CUP PACEART REMOTE DEVICE CHECK
Date Time Interrogation Session: 20171215194000
Implantable Pulse Generator Implant Date: 20160325

## 2016-05-10 NOTE — Progress Notes (Signed)
Carelink summary report received. Battery status OK. Normal device function. No new symptom episodes, tachy episodes, brady, or pause episodes. 4 AF 93.5% +Xarelto. Monthly summary reports and ROV/PRN

## 2016-05-11 ENCOUNTER — Other Ambulatory Visit: Payer: Self-pay | Admitting: *Deleted

## 2016-05-11 MED ORDER — DOFETILIDE 500 MCG PO CAPS: 500.0000 ug | ORAL_CAPSULE | Freq: Two times a day (BID) | ORAL | 3 refills | Status: DC

## 2016-05-13 ENCOUNTER — Ambulatory Visit (INDEPENDENT_AMBULATORY_CARE_PROVIDER_SITE_OTHER): Payer: BLUE CROSS/BLUE SHIELD | Admitting: Surgery

## 2016-05-13 ENCOUNTER — Encounter: Payer: Self-pay | Admitting: Surgery

## 2016-05-13 VITALS — BP 132/84 | HR 68 | Temp 97.4°F | Resp 20 | Ht 73.0 in | Wt 273.8 lb

## 2016-05-13 DIAGNOSIS — I729 Aneurysm of unspecified site: Secondary | ICD-10-CM | POA: Diagnosis not present

## 2016-05-13 NOTE — Progress Notes (Signed)
[facility] [place]  Patient name: Austin Woods MRN: [id number] DOB: 10-23-1965 Sex: male   REASON FOR VISIT:    Follow up right groin pseudoaneurysm  HISOTRY OF PRESENT ILLNESS:    Austin Woods is a 51 y.o. male initially developed a right femoral pseudoaneurysm after an A. fib ablation and PCI.  I performed a thrombin injection.  He had a small residual pseudoaneurysm which we have been following.  He is back today for ultrasound.  He states that the area has gotten significantly better.  He will occasionally have issues with   PAST MEDICAL HISTORY:   Past Medical History:  Diagnosis Date  . Asthma   . GERD (gastroesophageal reflux disease)   . Headache   . High cholesterol   . History of DVT (deep vein thrombosis)    a. left leg following ablation for SVT  . Hypertension   . Persistent atrial fibrillation ([place])    a. Dx 08/2012, xarelto initiated; b. 09/2012 Tikosyn initiated -> DCCV -> Sinus c. PVI 03/2013 d. PVI 03/2014  . Sleep apnea    does not use CPAP (he reports having uvulectomy)   . Wolff-Parkinson-White (WPW) syndrome    a. s/p RFCA 2001 by Dr Caryl Comes, pt reports complicated by post procedure DVT     FAMILY HISTORY:   Family History  Problem Relation Age of Onset  . CAD Father 45  . Diabetes Father   . Heart attack Father   . Transient ischemic attack Father   . Vascular Disease Father   . Diabetes Mother   . Anemia Mother   . Gout Mother   . Aneurysm Maternal Grandmother   . Stroke Maternal Grandfather   . Diabetes Paternal Grandmother   . Stroke Paternal Grandfather   . Diabetes Brother     SOCIAL HISTORY:   Social History  Substance Use Topics  . Smoking status: Former Smoker    Packs/day: 1.00    Years: 7.00    Types: Cigarettes    Start date: 05/19/2003    Quit date: 08/06/2010  . Smokeless tobacco: Never Used  . Alcohol use No     ALLERGIES:   No Known  Allergies   CURRENT MEDICATIONS:   Current Outpatient Prescriptions  Medication Sig Dispense Refill  . acetaminophen (TYLENOL) 500 MG tablet Take 1,000 mg by mouth every 6 (six) hours as needed for headache.    . albuterol (PROVENTIL HFA) 108 (90 Base) MCG/ACT inhaler Inhale 2 puffs into the lungs every 4 (four) hours as needed for wheezing or shortness of breath. 1 Inhaler 0  . atorvastatin (LIPITOR) 10 MG tablet Take 1 tablet (10 mg total) by mouth daily. (Patient taking differently: Take 10 mg by mouth daily at 6 PM. ) 30 tablet 0  . Cholecalciferol (VITAMIN D) 2000 UNITS CAPS Take 2,000 Units by mouth daily.     . clopidogrel (PLAVIX) 75 MG tablet Take 1 tablet (75 mg total) by mouth daily with breakfast. 90 tablet 3  . dofetilide (TIKOSYN) 500 MCG capsule Take 1 capsule (500 mcg total) by mouth 2 (two) times daily. 180 capsule 3  . enalapril (VASOTEC) 10 MG tablet Take 1 tablet (10 mg total) by mouth daily. 90 tablet 3  . Fluticasone-Salmeterol (ADVAIR DISKUS) 250-50 MCG/DOSE AEPB Inhale 1 puff into the lungs every 12 (twelve) hours. 60 each 0  . furosemide (LASIX) 40 MG tablet TAKE 1 TABLET BY MOUTH  DAILY 30 tablet 0  . ketoconazole (  NIZORAL) 2 % cream Apply 1 application topically 2 (two) times daily. (Patient taking differently: Apply 1 application topically daily as needed for irritation. ) 30 g 4  . MAGNESIUM PO Take 1 tablet by mouth daily.    . metoprolol succinate (TOPROL-XL) 100 MG 24 hr tablet Take 1 tablet (100 mg total) by mouth daily. Take with or immediately following a meal. 30 tablet 3  . nitroGLYCERIN (NITROSTAT) 0.4 MG SL tablet Place 1 tablet (0.4 mg total) under the tongue every 5 (five) minutes x 3 doses as needed for chest pain. 75 tablet 1  . oxybutynin (DITROPAN) 5 MG tablet Take 0.5 tablets (2.5 mg total) by mouth 2 (two) times daily as needed for bladder spasms. (Patient taking differently: Take 5 mg by mouth daily as needed for bladder spasms. ) 45 tablet 3  .  pantoprazole (PROTONIX) 40 MG tablet Take 1 tablet (40 mg total) by mouth daily. 90 tablet 3  . Potassium Chloride ER 20 MEQ TBCR Take 1 tablet by mouth daily. 90 tablet 1  . PROCTOZONE-HC 2.5 % rectal cream Apply 3 times daily (Patient taking differently: Apply rectally as needed for hemorrhoids) 90 g 3  . rivaroxaban (XARELTO) 20 MG TABS tablet Take 1 tablet (20 mg total) by mouth daily with supper. 90 tablet 3  . tadalafil (CIALIS) 5 MG tablet Take 1 tablet (5 mg total) by mouth daily as needed for erectile dysfunction. 10 tablet 0  . triamcinolone cream (KENALOG) 0.1 % Apply 1 application topically 2 (two) times daily. Prn rash; use up to 2 weeks 30 g 0   No current facility-administered medications for this visit.     REVIEW OF SYSTEMS:   [X]  denotes positive finding, [ ]  denotes negative finding Cardiac  Comments:  Chest pain or chest pressure:    Shortness of breath upon exertion:    Short of breath when lying flat:    Irregular heart rhythm:        Vascular    Pain in calf, thigh, or hip brought on by ambulation:    Pain in feet at night that wakes you up from your sleep:     Blood clot in your veins:    Leg swelling:         Pulmonary    Oxygen at home:    Productive cough:     Wheezing:         Neurologic    Sudden weakness in arms or legs:     Sudden numbness in arms or legs:     Sudden onset of difficulty speaking or slurred speech:    Temporary loss of vision in one eye:     Problems with dizziness:         Gastrointestinal    Blood in stool:     Vomited blood:         Genitourinary    Burning when urinating:     Blood in urine:        Psychiatric    Major depression:         Hematologic    Bleeding problems:    Problems with blood clotting too easily:        Skin    Rashes or ulcers:        Constitutional    Fever or chills:      PHYSICAL EXAM:   Vitals:   05/13/16 1053  BP: 132/84  Pulse: 68  Resp: 20  Temp: 97.4 F (36.3  C)  TempSrc:  Oral  SpO2: 96%  Weight: 273 lb 12.8 oz (124.2 kg)  Height: 6\' 1"  (1.854 m)    GENERAL: The patient is a well-nourished male, in no acute distress. The vital signs are documented above. CARDIAC: There is a regular rate and rhythm.  VASCULAR: Palpable right femoral pulse which is tender to touch no significant ecchymosis PULMONARY: Non-labord respirations MUSCULOSKELETAL: There are no major deformities or cyanosis. NEUROLOGIC: No focal weakness or paresthesias are detected. SKIN: There are no ulcers or rashes noted. PSYCHIATRIC: The patient has a normal affect.  STUDIES:   I have reviewed his ultrasound.  This reveals that the pseudoaneurysm has decreased in size.  Today it measures 7.7 x 3.4 cm.  Previously it was 9 x 4.8 cm.  The residual pseudoaneurysm cavity measures 0.86 x 0.52 cm with no flow visualized  MEDICAL ISSUES:   Right groin pseudoaneurysm: I believe this is completely thrombosed.  This should continue to improve over time and completely resolved.  I will bring him back in 3 months for repeat ultrasound.  Told him he could return to work    Annamarie Major, MD [facility] [phone] Pager [phone]

## 2016-05-19 ENCOUNTER — Ambulatory Visit (INDEPENDENT_AMBULATORY_CARE_PROVIDER_SITE_OTHER): Payer: BLUE CROSS/BLUE SHIELD | Admitting: *Deleted

## 2016-05-19 DIAGNOSIS — I4891 Unspecified atrial fibrillation: Secondary | ICD-10-CM

## 2016-05-20 ENCOUNTER — Encounter: Payer: Self-pay | Admitting: Internal Medicine

## 2016-05-20 NOTE — Progress Notes (Signed)
Carelink Summary Report / Loop Recorder 

## 2016-05-29 ENCOUNTER — Ambulatory Visit (INDEPENDENT_AMBULATORY_CARE_PROVIDER_SITE_OTHER): Payer: BLUE CROSS/BLUE SHIELD | Admitting: Internal Medicine

## 2016-05-29 ENCOUNTER — Encounter: Payer: Self-pay | Admitting: Internal Medicine

## 2016-05-29 VITALS — BP 142/80 | HR 55 | Ht 73.0 in | Wt 278.0 lb

## 2016-05-29 DIAGNOSIS — Z955 Presence of coronary angioplasty implant and graft: Secondary | ICD-10-CM

## 2016-05-29 DIAGNOSIS — I729 Aneurysm of unspecified site: Secondary | ICD-10-CM | POA: Diagnosis not present

## 2016-05-29 DIAGNOSIS — I5022 Chronic systolic (congestive) heart failure: Secondary | ICD-10-CM | POA: Diagnosis not present

## 2016-05-29 DIAGNOSIS — I4819 Other persistent atrial fibrillation: Secondary | ICD-10-CM

## 2016-05-29 DIAGNOSIS — I481 Persistent atrial fibrillation: Secondary | ICD-10-CM | POA: Diagnosis not present

## 2016-05-29 LAB — CUP PACEART INCLINIC DEVICE CHECK
Implantable Pulse Generator Implant Date: 20160325
MDC IDC SESS DTM: 20180223123734

## 2016-05-29 MED ORDER — METOPROLOL SUCCINATE ER 100 MG PO TB24: ORAL_TABLET | ORAL | 3 refills | Status: DC

## 2016-05-29 MED ORDER — ENALAPRIL MALEATE 10 MG PO TABS: 10.0000 mg | ORAL_TABLET | Freq: Two times a day (BID) | ORAL | 3 refills | Status: AC

## 2016-05-29 NOTE — Progress Notes (Signed)
PCP:  [name], MD Primary Cardiologist:  Bronson Ing Electrophysiologist: Dr. Rayann Heman  The patient presents today for electrophysiology follow up.  Finally appears to be improving after a prolonged difficulty with R groin pseudoaneurysm and afib with RVR.  He is now maintaining sinus.  R groin is healed,  He is back to work.  He has been inactive for 3 months.  He finds that he is easily winded.  + chest heaviness at times.  No chest pain Denies presyncope or syncope   Past Medical History  Diagnosis Date  . Hypertension   . High cholesterol   . Asthma   . GERD (gastroesophageal reflux disease)   . History of DVT (deep vein thrombosis)     a. left leg following ablation for SVT  . Wolff-Parkinson-White (WPW) syndrome     a. s/p RFCA 2001 by Dr Caryl Comes, pt reports complicated by post procedure DVT  . Persistent atrial fibrillation     a. Dx 08/2012, xarelto initiated; b. 09/2012 Tikosyn initiated -> DCCV -> Sinus c. PVI 03/2013 d. PVI 03/2014  . Sleep apnea     does not use CPAP (he reports having uvulectomy)    Past Surgical History  Procedure Laterality Date  . Back surgery    . Tonsillectomy    . Stomach surgery      reflux, fundoplication  . Nose surgery      sleep apnea surgery  . Tee without cardioversion N/A 08/25/2012    Procedure: TRANSESOPHAGEAL ECHOCARDIOGRAM (TEE);  Surgeon: Thayer Headings, MD;  Location: [place];  Service: Cardiovascular;  Laterality: N/A;  . Cardioversion N/A 08/25/2012    Procedure: CARDIOVERSION;  Surgeon: Thayer Headings, MD;  Location: [facility] ENDOSCOPY;  Service: Cardiovascular;  Laterality: N/A;  . Cardioversion N/A 09/06/2012    Procedure: CARDIOVERSION/Bedside;  Surgeon: Carlena Bjornstad, MD;  Location: [place];  Service: Cardiovascular;  Laterality: N/A;  . Tee without cardioversion  03/23/2013    DR ROSS  . Atrial fibrillation ablation  03/23/2013    PVI by DR Rayann Heman for afib  . Tee without cardioversion N/A 03/23/2013    Procedure:  TRANSESOPHAGEAL ECHOCARDIOGRAM (TEE);  Surgeon: Fay Records, MD;  Location: [facility] ENDOSCOPY;  Service: Cardiovascular;  Laterality: N/A;  . Cardioversion N/A 01/12/2014    Procedure: CARDIOVERSION;  Surgeon: Pixie Casino, MD;  Location: [facility] ENDOSCOPY;  Service: Cardiovascular;  Laterality: N/A;  . Cardiac surgery    . Atrial fibrillation ablation N/A 03/23/2013    Procedure: ATRIAL FIBRILLATION ABLATION;  Surgeon: Coralyn Mark, MD;  Location: [place] CATH LAB;  Service: Cardiovascular;  Laterality: N/A;  . Tee without cardioversion N/A 03/26/2014    Procedure: TRANSESOPHAGEAL ECHOCARDIOGRAM (TEE);  Surgeon: Josue Hector, MD;  Location: [facility] ENDOSCOPY;  Service: Cardiovascular;  Laterality: N/A;  . Atrial fibrillation ablation N/A 03/27/2014    PVI Dr Rayann Heman    Current Outpatient Prescriptions  Medication Sig Dispense Refill  .  Current Outpatient Prescriptions:  .  acetaminophen (TYLENOL) 500 MG tablet, Take 1,000 mg by mouth every 6 (six) hours as needed for headache., Disp: , Rfl:  .  albuterol (PROVENTIL HFA) 108 (90 Base) MCG/ACT inhaler, Inhale 2 puffs into the lungs every 4 (four) hours as needed for wheezing or shortness of breath., Disp: 1 Inhaler, Rfl: 0 .  atorvastatin (LIPITOR) 10 MG tablet, Take 1 tablet (10 mg total) by mouth daily., Disp: 30 tablet, Rfl: 0 .  Cholecalciferol (VITAMIN D) 2000 UNITS CAPS, Take 2,000 Units by  mouth daily. , Disp: , Rfl:  .  clopidogrel (PLAVIX) 75 MG tablet, Take 1 tablet (75 mg total) by mouth daily with breakfast., Disp: 90 tablet, Rfl: 3 .  dofetilide (TIKOSYN) 500 MCG capsule, Take 1 capsule (500 mcg total) by mouth 2 (two) times daily., Disp: 180 capsule, Rfl: 3 .  enalapril (VASOTEC) 10 MG tablet, Take 1 tablet (10 mg total) by mouth daily., Disp: 90 tablet, Rfl: 3 .  Fluticasone-Salmeterol (ADVAIR DISKUS) 250-50 MCG/DOSE AEPB, Inhale 1 puff into the lungs every 12 (twelve) hours., Disp: 60 each, Rfl: 0 .  furosemide (LASIX) 40 MG tablet, TAKE 1  TABLET BY MOUTH  DAILY, Disp: 30 tablet, Rfl: 0 .  hydrocortisone (PROCTOZONE-HC) 2.5 % rectal cream, Place 1 application rectally as needed (Use as directed)., Disp: , Rfl:  .  ketoconazole (NIZORAL) 2 % cream, Apply 1 application topically 2 (two) times daily as needed for irritation., Disp: , Rfl:  .  MAGNESIUM PO, Take 1 tablet by mouth daily., Disp: , Rfl:  .  metoprolol succinate (TOPROL-XL) 100 MG 24 hr tablet, Take 1 tablet (100 mg total) by mouth daily. Take with or immediately following a meal., Disp: 30 tablet, Rfl: 3 .  nitroGLYCERIN (NITROSTAT) 0.4 MG SL tablet, Place 1 tablet (0.4 mg total) under the tongue every 5 (five) minutes x 3 doses as needed for chest pain., Disp: 75 tablet, Rfl: 1 .  oxybutynin (DITROPAN) 5 MG tablet, Take 0.5 tablets (2.5 mg total) by mouth 2 (two) times daily as needed for bladder spasms., Disp: 45 tablet, Rfl: 3 .  pantoprazole (PROTONIX) 40 MG tablet, Take 1 tablet (40 mg total) by mouth daily., Disp: 90 tablet, Rfl: 3 .  POTASSIUM PO, Take 1 tablet by mouth daily. OTC, Disp: , Rfl:  .  rivaroxaban (XARELTO) 20 MG TABS tablet, Take 1 tablet (20 mg total) by mouth daily with supper., Disp: 90 tablet, Rfl: 3 .  tadalafil (CIALIS) 5 MG tablet, Take 1 tablet (5 mg total) by mouth daily as needed for erectile dysfunction., Disp: 10 tablet, Rfl: 0 .  triamcinolone cream (KENALOG) 0.1 %, Apply 1 application topically 2 (two) times daily. Prn rash; use up to 2 weeks, Disp: 30 g, Rfl: 0      .      .      .      .      .      .      .      .      .      .      .      .      .       No current facility-administered medications for this visit.   ROS- all systems are reviewed and negative except as per HPI above  No Known Allergies  History   Social History  . Marital Status: Married    Spouse Name: N/A  . Number of Children: 2  . Years of Education: N/A   Occupational History  .  [employer]   Social History Main Topics  . Smoking status:  Former Smoker    Quit date: 08/06/2010  . Smokeless tobacco: Never Used  . Alcohol Use: No  . Drug Use: No  . Sexual Activity: Not on file   Other Topics Concern  . Not on file   Social History Narrative   Pt lives in [place] with wife.  Works at [employer]  Family History  Problem Relation Age of Onset  . CAD Father 86  . Diabetes Father   . Heart attack Father   . Transient ischemic attack Father   . Vascular Disease Father   . Diabetes Mother   . Anemia Mother   . Gout Mother   . Diabetes Brother   . Aneurysm Maternal Grandmother   . Stroke Maternal Grandfather   . Diabetes Paternal Grandmother   . Stroke Paternal Grandfather      PE: Vitals:   05/29/16 0843  BP: (!) 142/80  Pulse: (!) 55   GEN- The patient is well appearing, alert and oriented x 3 today.   Head- normocephalic, atraumatic Eyes-  Sclera clear, conjunctiva pink Ears- hearing intact Oropharynx- clear Neck- supple,  Lungs- Clear to ausculation bilaterally, normal work of breathing Heart-regular rate and rhythm, no murmurs, rubs or gallops, PMI not laterally displaced GI- soft, NT, ND, + BS Extremities- no clubbing, cyanosis, no edema MS- no significant deformity or atrophy Skin- no rash or lesion Psych- euthymic mood, full affect Neuro- strength and sensation are intact   ILR interrogation is personally reviewed  Assessment and Plan:  1. Persistent Afib S/p PVI x 3 (most recently 11/17 with good result).  He had ERAF for which he was placed on tikosyn We should keep him on tikosyn for now Continue xarelto Reduce toprol today to 50mg  daily bmet and mg from 05/07/16 reviewed  2. CAD S/p recent PCI of the LAD with subsequent D1 jailed.  This was opened with 30% residual stenosis. Continue plavix x 6 months (April) He should follow-up with Dr Bronson Ing in April who will hopefully stop plavix Reduce toprol today to 50mg  daily Now that R groin is healed and AF is controlled, I have advised  cardiac rehab  3. OSA Compliance with CPAP encouraged.  4. HTN Reduce toprol due to bradycardia Increase enalapril to 10mg  BID bmet from 05/07/16 reviewed  5. Obesity Body mass index is Body mass index is 36.68 kg/m.  Stable No change required today  Follow-up with Dr Bronson Ing in 2 months Follow-up in AF clinic in 3 months I will see in 6 months unless problems arise  Thompson Grayer MD, [facility] 05/29/2016 9:15 AM

## 2016-05-29 NOTE — Patient Instructions (Signed)
Medication Instructions:  Your physician has recommended you make the following change in your medication:  1) Decrease Toprol to 50 mg daily 2) increase Enalapril to 10 mg twice daily   Labwork: None ordered   Testing/Procedures: You have been referred to [facility]   Follow-Up: Your physician recommends that you schedule a follow-up appointment in: 2 months with Dr Bronson Ing, 3 months with Roderic Palau NP and 6 months with Dr Rayann Heman   Any Other Special Instructions Will Be Listed Below (If Applicable).     If you need a refill on your cardiac medications before your next appointment, please call your pharmacy.

## 2016-05-30 LAB — CUP PACEART REMOTE DEVICE CHECK
Date Time Interrogation Session: 20180114194242
MDC IDC PG IMPLANT DT: 20160325

## 2016-05-30 NOTE — Progress Notes (Signed)
Carelink summary report received. Battery status OK. Normal device function. No new symptom episodes, tachy episodes, brady, or pause episodes. AF 100% +Xarelto. Monthly summary reports and ROV/PRN

## 2016-06-04 DIAGNOSIS — E785 Hyperlipidemia, unspecified: Secondary | ICD-10-CM | POA: Diagnosis not present

## 2016-06-04 DIAGNOSIS — I10 Essential (primary) hypertension: Secondary | ICD-10-CM | POA: Diagnosis not present

## 2016-06-04 DIAGNOSIS — Z008 Encounter for other general examination: Secondary | ICD-10-CM | POA: Diagnosis not present

## 2016-06-04 DIAGNOSIS — E559 Vitamin D deficiency, unspecified: Secondary | ICD-10-CM | POA: Diagnosis not present

## 2016-06-04 DIAGNOSIS — E669 Obesity, unspecified: Secondary | ICD-10-CM | POA: Diagnosis not present

## 2016-06-13 ENCOUNTER — Other Ambulatory Visit: Payer: Self-pay | Admitting: Cardiology

## 2016-06-13 MED ORDER — DOFETILIDE 500 MCG PO CAPS: 500.0000 ug | ORAL_CAPSULE | Freq: Two times a day (BID) | ORAL | 3 refills | Status: DC

## 2016-06-15 LAB — CUP PACEART REMOTE DEVICE CHECK
Date Time Interrogation Session: 20180213203901
MDC IDC PG IMPLANT DT: 20160325

## 2016-06-18 ENCOUNTER — Ambulatory Visit (INDEPENDENT_AMBULATORY_CARE_PROVIDER_SITE_OTHER): Payer: BLUE CROSS/BLUE SHIELD | Admitting: *Deleted

## 2016-06-18 DIAGNOSIS — I4891 Unspecified atrial fibrillation: Secondary | ICD-10-CM

## 2016-06-19 NOTE — Progress Notes (Signed)
Carelink Summary Report / Loop Recorder 

## 2016-06-27 LAB — CUP PACEART REMOTE DEVICE CHECK
Implantable Pulse Generator Implant Date: 20160325
MDC IDC SESS DTM: 20180315203844

## 2016-06-27 NOTE — Progress Notes (Signed)
Carelink summary report received. Battery status OK. Normal device function. No new symptom episodes, tachy episodes, brady, or pause episodes. 7 AF 1.9% 2 w/ ECGs UTD D/T artifact. Known AF +Tikosyn +Xarelto. Monthly summary reports and ROV/PRN

## 2016-07-02 DIAGNOSIS — Z20828 Contact with and (suspected) exposure to other viral communicable diseases: Secondary | ICD-10-CM | POA: Diagnosis not present

## 2016-07-10 ENCOUNTER — Observation Stay (HOSPITAL_COMMUNITY)
Admission: EM | Admit: 2016-07-10 | Discharge: 2016-07-11 | Disposition: A | Discharge status: Home / Self Care | Payer: BLUE CROSS/BLUE SHIELD | Attending: Internal Medicine | Admitting: Internal Medicine

## 2016-07-10 ENCOUNTER — Emergency Department (HOSPITAL_COMMUNITY): Payer: BLUE CROSS/BLUE SHIELD

## 2016-07-10 ENCOUNTER — Telehealth: Payer: Self-pay | Admitting: Cardiovascular Disease

## 2016-07-10 ENCOUNTER — Encounter (HOSPITAL_COMMUNITY): Payer: Self-pay

## 2016-07-10 DIAGNOSIS — G4733 Obstructive sleep apnea (adult) (pediatric): Secondary | ICD-10-CM | POA: Diagnosis present

## 2016-07-10 DIAGNOSIS — R0789 Other chest pain: Principal | ICD-10-CM | POA: Insufficient documentation

## 2016-07-10 DIAGNOSIS — I480 Paroxysmal atrial fibrillation: Secondary | ICD-10-CM | POA: Diagnosis not present

## 2016-07-10 DIAGNOSIS — I4891 Unspecified atrial fibrillation: Secondary | ICD-10-CM | POA: Diagnosis present

## 2016-07-10 DIAGNOSIS — I10 Essential (primary) hypertension: Secondary | ICD-10-CM | POA: Insufficient documentation

## 2016-07-10 DIAGNOSIS — J45909 Unspecified asthma, uncomplicated: Secondary | ICD-10-CM | POA: Diagnosis not present

## 2016-07-10 DIAGNOSIS — R079 Chest pain, unspecified: Secondary | ICD-10-CM | POA: Diagnosis present

## 2016-07-10 DIAGNOSIS — Z87891 Personal history of nicotine dependence: Secondary | ICD-10-CM | POA: Insufficient documentation

## 2016-07-10 DIAGNOSIS — I456 Pre-excitation syndrome: Secondary | ICD-10-CM | POA: Diagnosis present

## 2016-07-10 DIAGNOSIS — I4819 Other persistent atrial fibrillation: Secondary | ICD-10-CM | POA: Diagnosis present

## 2016-07-10 LAB — BASIC METABOLIC PANEL WITH GFR
Anion gap: 7 (ref 5–15)
BUN: 13 mg/dL (ref 6–20)
CO2: 23 mmol/L (ref 22–32)
Calcium: 9 mg/dL (ref 8.9–10.3)
Chloride: 107 mmol/L (ref 101–111)
Creatinine, Ser: 0.97 mg/dL (ref 0.61–1.24)
GFR calc Af Amer: >60 mL/min
GFR calc non Af Amer: >60 mL/min
Glucose, Bld: 187 mg/dL — ABNORMAL HIGH (ref 65–99)
Potassium: 4 mmol/L (ref 3.5–5.1)
Sodium: 137 mmol/L (ref 135–145)

## 2016-07-10 LAB — CBC
HCT: 42.2 % (ref 39.0–52.0)
Hemoglobin: 14.4 g/dL (ref 13.0–17.0)
MCH: 32.1 pg (ref 26.0–34.0)
MCHC: 34.1 g/dL (ref 30.0–36.0)
MCV: 94.2 fL (ref 78.0–100.0)
Platelets: 236 10*3/uL (ref 150–400)
RBC: 4.48 MIL/uL (ref 4.22–5.81)
RDW: 13.1 % (ref 11.5–15.5)
WBC: 7 10*3/uL (ref 4.0–10.5)

## 2016-07-10 LAB — TROPONIN I
Troponin I: <0.03 ng/mL
Troponin I: <0.03 ng/mL (ref <0.03)

## 2016-07-10 MED ORDER — ALBUTEROL SULFATE (2.5 MG/3ML) 0.083% IN NEBU: 3.0000 mL | INHALATION_SOLUTION | RESPIRATORY_TRACT | Status: DC | PRN

## 2016-07-10 MED ORDER — IOPAMIDOL (ISOVUE-370) INJECTION 76%
100.0000 mL | Freq: Once | INTRAVENOUS | Status: AC | PRN
  Administered 2016-07-10: 100 mL via INTRAVENOUS

## 2016-07-10 MED ORDER — ACETAMINOPHEN 325 MG PO TABS: 650.0000 mg | ORAL_TABLET | Freq: Four times a day (QID) | ORAL | Status: DC | PRN

## 2016-07-10 MED ORDER — ONDANSETRON HCL 4 MG/2ML IJ SOLN: 4.0000 mg | Freq: Four times a day (QID) | INTRAMUSCULAR | Status: DC | PRN

## 2016-07-10 MED ORDER — MOMETASONE FURO-FORMOTEROL FUM 200-5 MCG/ACT IN AERO
2.0000 | INHALATION_SPRAY | Freq: Two times a day (BID) | RESPIRATORY_TRACT | Status: DC
  Administered 2016-07-10 – 2016-07-11 (×2): 2 via RESPIRATORY_TRACT
  Filled 2016-07-10: qty 8.8

## 2016-07-10 MED ORDER — FUROSEMIDE 40 MG PO TABS
40.0000 mg | ORAL_TABLET | Freq: Every day | ORAL | Status: DC
  Administered 2016-07-11: 40 mg via ORAL
  Filled 2016-07-10: qty 1

## 2016-07-10 MED ORDER — NITROGLYCERIN 0.4 MG SL SUBL
0.4000 mg | SUBLINGUAL_TABLET | SUBLINGUAL | Status: DC | PRN
  Administered 2016-07-10: 0.4 mg via SUBLINGUAL
  Filled 2016-07-10: qty 1

## 2016-07-10 MED ORDER — RIVAROXABAN 20 MG PO TABS
20.0000 mg | ORAL_TABLET | Freq: Every day | ORAL | Status: DC
  Administered 2016-07-10: 20 mg via ORAL
  Filled 2016-07-10: qty 1

## 2016-07-10 MED ORDER — MOMETASONE FURO-FORMOTEROL FUM 200-5 MCG/ACT IN AERO
INHALATION_SPRAY | RESPIRATORY_TRACT | Status: AC
  Filled 2016-07-10: qty 8.8

## 2016-07-10 MED ORDER — VITAMIN D 1000 UNITS PO TABS
2000.0000 [IU] | ORAL_TABLET | Freq: Every day | ORAL | Status: DC
  Administered 2016-07-11: 2000 [IU] via ORAL
  Filled 2016-07-10: qty 2

## 2016-07-10 MED ORDER — DOFETILIDE 500 MCG PO CAPS
500.0000 ug | ORAL_CAPSULE | Freq: Two times a day (BID) | ORAL | Status: DC
  Administered 2016-07-10 – 2016-07-11 (×2): 500 ug via ORAL
  Filled 2016-07-10 (×6): qty 1

## 2016-07-10 MED ORDER — PANTOPRAZOLE SODIUM 40 MG PO TBEC
40.0000 mg | DELAYED_RELEASE_TABLET | Freq: Every day | ORAL | Status: DC
  Administered 2016-07-11: 40 mg via ORAL
  Filled 2016-07-10: qty 1

## 2016-07-10 MED ORDER — CLOPIDOGREL BISULFATE 75 MG PO TABS
75.0000 mg | ORAL_TABLET | Freq: Every day | ORAL | Status: DC
  Administered 2016-07-11: 75 mg via ORAL
  Filled 2016-07-10: qty 1

## 2016-07-10 MED ORDER — ACETAMINOPHEN 500 MG PO TABS: 1000.0000 mg | ORAL_TABLET | Freq: Four times a day (QID) | ORAL | Status: DC | PRN

## 2016-07-10 MED ORDER — ACETAMINOPHEN 650 MG RE SUPP: 650.0000 mg | Freq: Four times a day (QID) | RECTAL | Status: DC | PRN

## 2016-07-10 MED ORDER — MORPHINE SULFATE (PF) 2 MG/ML IV SOLN: 2.0000 mg | INTRAVENOUS | Status: DC | PRN

## 2016-07-10 MED ORDER — METOPROLOL SUCCINATE ER 50 MG PO TB24
50.0000 mg | ORAL_TABLET | Freq: Every day | ORAL | Status: DC
  Administered 2016-07-11: 50 mg via ORAL
  Filled 2016-07-10: qty 1

## 2016-07-10 MED ORDER — SODIUM CHLORIDE 0.9% FLUSH
3.0000 mL | Freq: Two times a day (BID) | INTRAVENOUS | Status: DC
  Administered 2016-07-10 – 2016-07-11 (×2): 3 mL via INTRAVENOUS

## 2016-07-10 MED ORDER — MAGNESIUM OXIDE 400 (241.3 MG) MG PO TABS
400.0000 mg | ORAL_TABLET | Freq: Every day | ORAL | Status: DC
  Administered 2016-07-11: 400 mg via ORAL
  Filled 2016-07-10: qty 1

## 2016-07-10 MED ORDER — ENALAPRIL MALEATE 5 MG PO TABS
10.0000 mg | ORAL_TABLET | Freq: Two times a day (BID) | ORAL | Status: DC
  Administered 2016-07-10 – 2016-07-11 (×2): 10 mg via ORAL
  Filled 2016-07-10 (×2): qty 2

## 2016-07-10 MED ORDER — ONDANSETRON HCL 4 MG PO TABS: 4.0000 mg | ORAL_TABLET | Freq: Four times a day (QID) | ORAL | Status: DC | PRN

## 2016-07-10 MED ORDER — ATORVASTATIN CALCIUM 10 MG PO TABS
10.0000 mg | ORAL_TABLET | Freq: Every day | ORAL | Status: DC
  Administered 2016-07-10: 10 mg via ORAL
  Filled 2016-07-10: qty 1

## 2016-07-10 MED ORDER — POTASSIUM 99 MG PO TABS: 99.0000 mg | ORAL_TABLET | Freq: Every day | ORAL | Status: DC

## 2016-07-10 MED ORDER — ASPIRIN 81 MG PO CHEW
324.0000 mg | CHEWABLE_TABLET | Freq: Once | ORAL | Status: AC
  Administered 2016-07-10: 324 mg via ORAL
  Filled 2016-07-10: qty 4

## 2016-07-10 NOTE — Telephone Encounter (Signed)
Patient has SOB for past 2-3 hrs.Awakened this am feeling nauseated, states he has been that way all week. SOB began and c/o feeling very tired.Has not tried NTG.I advised him to go to the ED. Patient has omeone who will take him    Will FYI Dr Bronson Ing

## 2016-07-10 NOTE — ED Provider Notes (Signed)
[facility] DEPT Provider Note   CSN: [id number] Arrival date & time: 07/10/16  1539     History   Chief Complaint Chief Complaint  Patient presents with  . Chest Pain    HPI BJORN HALLAS is a 51 y.o. male.  HPI  51 year old male history of DVT, asthma, Hyperlipidemia, hypertension, obesity and Wolff-Parkinson-White status post ablation presents to the emergency department today secondary to chest pain. Patient states that he's had chest pain throughout the day today that since he was shortness of breath nausea and lightheadedness as well. Stated feels like a pressure but mostly the concern is the shortness of breath. No fevers, cough, swollen legs. No other associated or modifying factors.   Past Medical History:  Diagnosis Date  . Asthma   . GERD (gastroesophageal reflux disease)   . Headache   . High cholesterol   . History of DVT (deep vein thrombosis)    a. left leg following ablation for SVT  . Hypertension   . Persistent atrial fibrillation ([place])    a. Dx 08/2012, xarelto initiated; b. 09/2012 Tikosyn initiated -> DCCV -> Sinus c. PVI 03/2013 d. PVI 03/2014  . Sleep apnea    does not use CPAP (he reports having uvulectomy)   . Wolff-Parkinson-White (WPW) syndrome    a. s/p RFCA 2001 by Dr Caryl Comes, pt reports complicated by post procedure DVT    Patient Active Problem List   Diagnosis Date Noted  . Chest pain in adult 07/10/2016  . Atypical chest pain 07/10/2016  . Pseudoaneurysm following procedure ([place]) 03/03/2016  . A-fib ([place]) 02/18/2016  . Chest pain 01/30/2016  . Persistent atrial fibrillation ([place])   . Unstable angina pectoris ([place])   . Asthma, chronic 08/26/2014  . Palpitations 06/28/2014  . Atrial fibrillation with RVR ([place]) 02/02/2014  . Acute on chronic systolic CHF (congestive heart failure) ([place]) 02/02/2014  . Obstructive sleep apnea 10/12/2012  . WPW (Wolff-Parkinson-White syndrome) 09/05/2012  . Hypokalemia 09/05/2012  . Secondary  cardiomyopathy ([place]) 09/03/2012  . Essential hypertension, benign 09/03/2012  . Fatigue 08/15/2012  . Morbid obesity ([place]) 08/15/2012  . Atrial fibrillation ([place]) 08/07/2012    Past Surgical History:  Procedure Laterality Date  . ATRIAL FIBRILLATION ABLATION  03/23/2013   PVI by DR Rayann Heman for afib  . ATRIAL FIBRILLATION ABLATION N/A 03/23/2013   Procedure: ATRIAL FIBRILLATION ABLATION;  Surgeon: Coralyn Mark, MD;  Location: [facility] CATH LAB;  Service: Cardiovascular;  Laterality: N/A;  . ATRIAL FIBRILLATION ABLATION N/A 03/27/2014   PVI Dr Rayann Heman  . BACK SURGERY    . CARDIAC CATHETERIZATION N/A 02/03/2016   Procedure: Left Heart Cath and Coronary Angiography;  Surgeon: Nelva Bush, MD;  Location: [facility] CV LAB;  Service: Cardiovascular;  Laterality: N/A;  . CARDIAC CATHETERIZATION N/A 02/03/2016   Procedure: Intravascular Pressure Wire/FFR Study;  Surgeon: Nelva Bush, MD;  Location: [facility] CV LAB;  Service: Cardiovascular;  Laterality: N/A;  . CARDIAC CATHETERIZATION N/A 02/03/2016   Procedure: Coronary Stent Intervention;  Surgeon: Nelva Bush, MD;  Location: [facility] CV LAB;  Service: Cardiovascular;  Laterality: N/A;  . CARDIAC SURGERY    . CARDIOVERSION N/A 08/25/2012   Procedure: CARDIOVERSION;  Surgeon: Thayer Headings, MD;  Location: [facility] ENDOSCOPY;  Service: Cardiovascular;  Laterality: N/A;  . CARDIOVERSION N/A 09/06/2012   Procedure: CARDIOVERSION/Bedside;  Surgeon: Carlena Bjornstad, MD;  Location: [facility];  Service: Cardiovascular;  Laterality: N/A;  . CARDIOVERSION N/A 01/12/2014   Procedure: CARDIOVERSION;  Surgeon: Nadean Corwin.  Hilty, MD;  Location: [facility];  Service: Cardiovascular;  Laterality: N/A;  . CARDIOVERSION N/A 01/30/2016   Procedure: CARDIOVERSION;  Surgeon: Sueanne Margarita, MD;  Location: [facility];  Service: Cardiovascular;  Laterality: N/A;  . CARDIOVERSION N/A 04/14/2016   Procedure: CARDIOVERSION;  Surgeon: Pixie Casino, MD;  Location:  [facility] ENDOSCOPY;  Service: Cardiovascular;  Laterality: N/A;  . COLONOSCOPY N/A 12/04/2015   Procedure: COLONOSCOPY;  Surgeon: Rogene Houston, MD;  Location: [facility] ENDO [facility];  Service: Endoscopy;  Laterality: N/A;  730  . ELECTROPHYSIOLOGIC STUDY N/A 02/18/2016   Procedure: Atrial Fibrillation Ablation;  Surgeon: Thompson Grayer, MD;  Location: [facility] CV LAB;  Service: Cardiovascular;  Laterality: N/A;  . LOOP RECORDER IMPLANT N/A 06/29/2014   Procedure: LOOP RECORDER IMPLANT;  Surgeon: Thompson Grayer, MD;  Location: [facility] CATH LAB;  Service: Cardiovascular;  Laterality: N/A;  . NOSE SURGERY     sleep apnea surgery  . PERIPHERAL VASCULAR CATHETERIZATION N/A 03/13/2016   Procedure: Thrombin Injection;  Surgeon: Serafina Mitchell, MD;  Location: [facility] CV LAB;  Service: Cardiovascular;  Laterality: N/A;  . STOMACH SURGERY     reflux, fundoplication  . TEE WITHOUT CARDIOVERSION N/A 08/25/2012   Procedure: TRANSESOPHAGEAL ECHOCARDIOGRAM (TEE);  Surgeon: Thayer Headings, MD;  Location: [facility];  Service: Cardiovascular;  Laterality: N/A;  . TEE WITHOUT CARDIOVERSION  03/23/2013   DR ROSS  . TEE WITHOUT CARDIOVERSION N/A 03/23/2013   Procedure: TRANSESOPHAGEAL ECHOCARDIOGRAM (TEE);  Surgeon: Fay Records, MD;  Location: [facility] ENDOSCOPY;  Service: Cardiovascular;  Laterality: N/A;  . TEE WITHOUT CARDIOVERSION N/A 03/26/2014   Procedure: TRANSESOPHAGEAL ECHOCARDIOGRAM (TEE);  Surgeon: Josue Hector, MD;  Location: [facility] ENDOSCOPY;  Service: Cardiovascular;  Laterality: N/A;  . TONSILLECTOMY         Home Medications    Prior to Admission medications   Medication Sig Start Date End Date Taking? Authorizing Provider  acetaminophen (TYLENOL) 500 MG tablet Take 1,000 mg by mouth every 6 (six) hours as needed for headache.   Yes Historical Provider, MD  albuterol (PROVENTIL HFA) 108 (90 Base) MCG/ACT inhaler Inhale 2 puffs into the lungs every 4 (four) hours as needed for wheezing or shortness of breath.  03/02/16  Yes [name], MD  atorvastatin (LIPITOR) 10 MG tablet Take 1 tablet (10 mg total) by mouth daily. 03/02/16  Yes [name], MD  Cholecalciferol (VITAMIN D) 2000 UNITS CAPS Take 2,000 Units by mouth daily.    Yes Historical Provider, MD  clopidogrel (PLAVIX) 75 MG tablet Take 1 tablet (75 mg total) by mouth daily with breakfast. 02/25/16  Yes Thompson Grayer, MD  dofetilide (TIKOSYN) 500 MCG capsule Take 1 capsule (500 mcg total) by mouth 2 (two) times daily. 06/13/16  Yes Brittainy M Simmons, PA-C  enalapril (VASOTEC) 10 MG tablet Take 1 tablet (10 mg total) by mouth 2 (two) times daily. 05/29/16  Yes Thompson Grayer, MD  Fluticasone-Salmeterol (ADVAIR DISKUS) 250-50 MCG/DOSE AEPB Inhale 1 puff into the lungs every 12 (twelve) hours. 03/02/16  Yes [name], MD  furosemide (LASIX) 40 MG tablet TAKE 1 TABLET BY MOUTH  DAILY 03/23/16  Yes [name], MD  MAGNESIUM PO Take 500 mg by mouth daily.    Yes Historical Provider, MD  metoprolol succinate (TOPROL-XL) 100 MG 24 hr tablet Take 1/2 tablet (50 mg) by mouth daily. Take with or immediately following a meal. 05/29/16  Yes Thompson Grayer, MD  nitroGLYCERIN (NITROSTAT) 0.4 MG SL tablet  Place 1 tablet (0.4 mg total) under the tongue every 5 (five) minutes x 3 doses as needed for chest pain. 02/25/16  Yes Thompson Grayer, MD  oxybutynin (DITROPAN) 5 MG tablet Take 0.5 tablets (2.5 mg total) by mouth 2 (two) times daily as needed for bladder spasms. 04/01/16  Yes [name], MD  pantoprazole (PROTONIX) 40 MG tablet Take 1 tablet (40 mg total) by mouth daily. 02/25/16  Yes Thompson Grayer, MD  POTASSIUM PO Take 99 mg by mouth daily. OTC    Yes Historical Provider, MD  rivaroxaban (XARELTO) 20 MG TABS tablet Take 1 tablet (20 mg total) by mouth daily with supper. 02/25/16  Yes Thompson Grayer, MD  tadalafil (CIALIS) 5 MG tablet Take 1 tablet (5 mg total) by mouth daily as needed for erectile dysfunction. 03/02/16  Yes [name],  MD  hydrocortisone (PROCTOZONE-HC) 2.5 % rectal cream Place 1 application rectally as needed (Use as directed).    Historical Provider, MD  ketoconazole (NIZORAL) 2 % cream Apply 1 application topically 2 (two) times daily as needed for irritation.    Historical Provider, MD    Family History Family History  Problem Relation Age of Onset  . CAD Father 83  . Diabetes Father   . Heart attack Father   . Transient ischemic attack Father   . Vascular Disease Father   . Diabetes Mother   . Anemia Mother   . Gout Mother   . Aneurysm Maternal Grandmother   . Stroke Maternal Grandfather   . Diabetes Paternal Grandmother   . Stroke Paternal Grandfather   . Diabetes Brother     Social History Social History  Substance Use Topics  . Smoking status: Former Smoker    Packs/day: 1.00    Years: 7.00    Types: Cigarettes    Start date: 05/19/2003    Quit date: 08/06/2010  . Smokeless tobacco: Never Used  . Alcohol use No     Allergies   Patient has no known allergies.   Review of Systems Review of Systems  All other systems reviewed and are negative.    Physical Exam Updated Vital Signs BP 109/76 (BP Location: Left Arm)   Pulse (!) 55   Temp 97.7 F (36.5 C) (Oral)   Resp 20   Ht 6\' 1"  (1.854 m)   Wt 270 lb (122.5 kg)   SpO2 98%   BMI 35.62 kg/m   Physical Exam  Constitutional: He is oriented to person, place, and time. He appears well-developed and well-nourished.  HENT:  Head: Normocephalic and atraumatic.  Eyes: Conjunctivae and EOM are normal.  Neck: Normal range of motion.  Cardiovascular: Normal rate.   Pulmonary/Chest: Effort normal. No respiratory distress. He exhibits tenderness (but does not recreate his pain).  Abdominal: Soft. He exhibits no distension. There is no tenderness.  Musculoskeletal: Normal range of motion.  Neurological: He is alert and oriented to person, place, and time. No cranial nerve deficit. Coordination normal.  Skin: Skin is warm and  dry.  Nursing note and vitals reviewed.    ED Treatments / Results  Labs (all labs ordered are listed, but only abnormal results are displayed) Labs Reviewed  BASIC METABOLIC PANEL - Abnormal; Notable for the following:       Result Value   Glucose, Bld 187 (*)    All other components within normal limits  CBC  TROPONIN I  TROPONIN I  HIV ANTIBODY (ROUTINE TESTING)  TROPONIN I  TROPONIN I  EKG  EKG Interpretation  Date/Time:  Friday July 10 2016 15:46:06 EDT Ventricular Rate:  83 PR Interval:    QRS Duration: 95 QT Interval:  396 QTC Calculation: 466 R Axis:   4 Text Interpretation:  Sinus rhythm Abnormal R-wave progression, early transition Baseline wander in lead(s) V1 V3 Technically poor tracing due to baseline wander no large ischemic changes evident Confirmed by [facility] MD, Corene Cornea [phone]) on 07/10/2016 3:56:09 PM       Radiology Ct Angio Chest Pe W And/or Wo Contrast  Result Date: 07/10/2016 CLINICAL DATA:  Dyspnea, chest pain, cardiac ablation December, 2017. EXAM: CT ANGIOGRAPHY CHEST WITH CONTRAST TECHNIQUE: Multidetector CT imaging of the chest was performed using the standard protocol during bolus administration of intravenous contrast. Multiplanar CT image reconstructions and MIPs were obtained to evaluate the vascular anatomy. CONTRAST:  100 cc Isovue 370 IV COMPARISON:  None. FINDINGS: Cardiovascular: Top normal size cardiac chambers without pericardial effusion. Three-vessel coronary arteriosclerosis is identified. The aorta is not aneurysmal and mildly uncoiled in appearance with aortic arch atherosclerosis. No aortic dissection. Bovine arch anatomy of the great vessels. No large central pulmonary embolus is identified. Mediastinum/Nodes: Substernal extension of the thyroid. Retrosternal thyroid isthmic nodule which appears solid measuring 18 x 13 mm, series 4, image 21. No mediastinal nor hilar lymphadenopathy. Lungs/Pleura: Lungs are clear. No pleural effusion or  pneumothorax. Upper Abdomen: No acute abnormality. Musculoskeletal: No chest wall abnormality. No acute or significant osseous findings. Review of the MIP images confirms the above findings. IMPRESSION: No acute pulmonary embolus, aortic aneurysm or dissection. No active pulmonary disease. Thyroid isthmic solid-appearing 18 x 13 mm nodule within a retrosternal thyroid gland. Ultrasound may help for further correlation and to assess for nodules elsewhere within the thyroid. This follows ACR consensus guidelines: Managing Incidental Thyroid Nodules Detected on Imaging: White Paper of the ACR Incidental Thyroid Findings Committee. J Am Coll Radiol 2015; 12:143-150. Electronically Signed   By: Ashley Royalty M.D.   On: 07/10/2016 18:26    Procedures Procedures (including critical care time)  Medications Ordered in ED Medications  dofetilide (TIKOSYN) capsule 500 mcg (500 mcg Oral Given 07/10/16 2328)  enalapril (VASOTEC) tablet 10 mg (10 mg Oral Given 07/10/16 2204)  metoprolol succinate (TOPROL-XL) 24 hr tablet 50 mg (not administered)  magnesium oxide (MAG-OX) tablet 400 mg (not administered)  furosemide (LASIX) tablet 40 mg (not administered)  acetaminophen (TYLENOL) tablet 1,000 mg (not administered)  albuterol (PROVENTIL) (2.5 MG/3ML) 0.083% nebulizer solution 3 mL (not administered)  atorvastatin (LIPITOR) tablet 10 mg (10 mg Oral Given 07/10/16 2213)  mometasone-formoterol (DULERA) 200-5 MCG/ACT inhaler 2 puff (2 puffs Inhalation Given 07/10/16 2149)  clopidogrel (PLAVIX) tablet 75 mg (not administered)  pantoprazole (PROTONIX) EC tablet 40 mg (not administered)  rivaroxaban (XARELTO) tablet 20 mg (20 mg Oral Given 07/10/16 2213)  cholecalciferol (VITAMIN D) tablet 2,000 Units (not administered)  sodium chloride flush (NS) 0.9 % injection 3 mL (3 mLs Intravenous Given 07/10/16 2214)  ondansetron (ZOFRAN) tablet 4 mg (not administered)    Or  ondansetron (ZOFRAN) injection 4 mg (not administered)    acetaminophen (TYLENOL) tablet 650 mg (not administered)    Or  acetaminophen (TYLENOL) suppository 650 mg (not administered)  morphine 2 MG/ML injection 2 mg (not administered)  aspirin chewable tablet 324 mg (324 mg Oral Given 07/10/16 1625)  iopamidol (ISOVUE-370) 76 % injection 100 mL (100 mLs Intravenous Contrast Given 07/10/16 1754)     Initial Impression / Assessment and Plan / ED Course  I have reviewed the triage vital signs and the nursing notes.  Pertinent labs & imaging results that were available during my care of the patient were reviewed by me and considered in my medical decision making (see chart for details).    Will eval for PE, ACS, pneumonia. Will give NTG and reassess for likely admission for ACS rule out if all negative.     Final Clinical Impressions(s) / ED Diagnoses   Final diagnoses:  Other chest pain      Merrily Pew, MD 07/11/16 [phone]

## 2016-07-10 NOTE — Telephone Encounter (Signed)
Patient has c/o chest pain. Asking to be seen today. May need to go to ER. / tg

## 2016-07-10 NOTE — ED Triage Notes (Signed)
Reports of chest pain that started last night into this morning. States he took 1 nitro at 1440 with relief.  States he feels shortness of breath at times with anxiety.

## 2016-07-10 NOTE — H&P (Signed)
History and Physical    Austin Woods ZOX:[id number] DOB: [birth year] DOA: 07/10/2016  PCP: Mickie Hillier, MD  Patient coming from: Home.    Chief Complaint:   Chest pain yesterday and today.   HPI: Austin Woods is an 51 y.o. male with hx of afib, on Xarelto, hx of 3 prior ablations, last done 02/2016, subsequent cath, with stent to the LAD, complicated with pseudoaneurysm, DVT.  with recurrent afib requiring more cardioversion, initiated on Tikosyn, presented to the ER with CP, retroseternal, with nausea, vomting, and mild diaphoresis.  He also has slgiht SOB as well.  In the ER, work up included EKG with no acute ST T changes, and initial troponin was negative.  He took a NTG and had relief of his chest pain.   Serology was unremarkable. A CTA was done, and there was no evidence of pulmonary embolism.   Hospitalist was asked to admit him for atypical chest pain, r/out MI.   ED Course:  See above.  Rewiew of Systems:  Constitutional: Negative for malaise, fever and chills. No significant weight loss or weight gain Eyes: Negative for eye pain, redness and discharge, diplopia, visual changes, or flashes of light. ENMT: Negative for ear pain, hoarseness, nasal congestion, sinus pressure and sore throat. No headaches; tinnitus, drooling, or problem swallowing. Cardiovascular: Negative for chest pain, palpitations, diaphoresis, dyspnea and peripheral edema. ; No orthopnea, PND Respiratory: Negative for cough, hemoptysis, wheezing and stridor. No pleuritic chestpain. Gastrointestinal: Negative for diarrhea, constipation,  melena, blood in stool, hematemesis, jaundice and rectal bleeding.    Genitourinary: Negative for frequency, dysuria, incontinence,flank pain and hematuria; Musculoskeletal: Negative for back pain and neck pain. Negative for swelling and trauma.;  Skin: . Negative for pruritus, rash, abrasions, bruising and skin lesion.; ulcerations Neuro: Negative for headache, lightheadedness  and neck stiffness. Negative for weakness, altered level of consciousness , altered mental status, extremity weakness, burning feet, involuntary movement, seizure and syncope.  Psych: negative for anxiety, depression, insomnia, tearfulness, panic attacks, hallucinations, paranoia, suicidal or homicidal ideation   Past Medical History:  Diagnosis Date  . Asthma   . GERD (gastroesophageal reflux disease)   . Headache   . High cholesterol   . History of DVT (deep vein thrombosis)    a. left leg following ablation for SVT  . Hypertension   . Persistent atrial fibrillation ([place])    a. Dx 08/2012, xarelto initiated; b. 09/2012 Tikosyn initiated -> DCCV -> Sinus c. PVI 03/2013 d. PVI 03/2014  . Sleep apnea    does not use CPAP (he reports having uvulectomy)   . Wolff-Parkinson-White (WPW) syndrome    a. s/p RFCA 2001 by Dr Caryl Comes, pt reports complicated by post procedure DVT    Past Surgical History:  Procedure Laterality Date  . ATRIAL FIBRILLATION ABLATION  03/23/2013   PVI by DR Rayann Heman for afib  . ATRIAL FIBRILLATION ABLATION N/A 03/23/2013   Procedure: ATRIAL FIBRILLATION ABLATION;  Surgeon: Coralyn Mark, MD;  Location: [place] CATH LAB;  Service: Cardiovascular;  Laterality: N/A;  . ATRIAL FIBRILLATION ABLATION N/A 03/27/2014   PVI Dr Rayann Heman  . BACK SURGERY    . CARDIAC CATHETERIZATION N/A 02/03/2016   Procedure: Left Heart Cath and Coronary Angiography;  Surgeon: Nelva Bush, MD;  Location: [place] CV LAB;  Service: Cardiovascular;  Laterality: N/A;  . CARDIAC CATHETERIZATION N/A 02/03/2016   Procedure: Intravascular Pressure Wire/FFR Study;  Surgeon: Nelva Bush, MD;  Location: [place] CV LAB;  Service: Cardiovascular;  Laterality:  N/A;  . CARDIAC CATHETERIZATION N/A 02/03/2016   Procedure: Coronary Stent Intervention;  Surgeon: Nelva Bush, MD;  Location: [place] CV LAB;  Service: Cardiovascular;  Laterality: N/A;  . CARDIAC SURGERY    . CARDIOVERSION N/A  08/25/2012   Procedure: CARDIOVERSION;  Surgeon: Thayer Headings, MD;  Location: [facility] ENDOSCOPY;  Service: Cardiovascular;  Laterality: N/A;  . CARDIOVERSION N/A 09/06/2012   Procedure: CARDIOVERSION/Bedside;  Surgeon: Carlena Bjornstad, MD;  Location: [place];  Service: Cardiovascular;  Laterality: N/A;  . CARDIOVERSION N/A 01/12/2014   Procedure: CARDIOVERSION;  Surgeon: Pixie Casino, MD;  Location: [facility] ENDOSCOPY;  Service: Cardiovascular;  Laterality: N/A;  . CARDIOVERSION N/A 01/30/2016   Procedure: CARDIOVERSION;  Surgeon: Sueanne Margarita, MD;  Location: [facility] ENDOSCOPY;  Service: Cardiovascular;  Laterality: N/A;  . CARDIOVERSION N/A 04/14/2016   Procedure: CARDIOVERSION;  Surgeon: Pixie Casino, MD;  Location: [facility] ENDOSCOPY;  Service: Cardiovascular;  Laterality: N/A;  . COLONOSCOPY N/A 12/04/2015   Procedure: COLONOSCOPY;  Surgeon: Rogene Houston, MD;  Location: [facility] ENDO [facility];  Service: Endoscopy;  Laterality: N/A;  730  . ELECTROPHYSIOLOGIC STUDY N/A 02/18/2016   Procedure: Atrial Fibrillation Ablation;  Surgeon: Thompson Grayer, MD;  Location: [place] CV LAB;  Service: Cardiovascular;  Laterality: N/A;  . LOOP RECORDER IMPLANT N/A 06/29/2014   Procedure: LOOP RECORDER IMPLANT;  Surgeon: Thompson Grayer, MD;  Location: [facility] CATH LAB;  Service: Cardiovascular;  Laterality: N/A;  . NOSE SURGERY     sleep apnea surgery  . PERIPHERAL VASCULAR CATHETERIZATION N/A 03/13/2016   Procedure: Thrombin Injection;  Surgeon: Serafina Mitchell, MD;  Location: [place] CV LAB;  Service: Cardiovascular;  Laterality: N/A;  . STOMACH SURGERY     reflux, fundoplication  . TEE WITHOUT CARDIOVERSION N/A 08/25/2012   Procedure: TRANSESOPHAGEAL ECHOCARDIOGRAM (TEE);  Surgeon: Thayer Headings, MD;  Location: [place];  Service: Cardiovascular;  Laterality: N/A;  . TEE WITHOUT CARDIOVERSION  03/23/2013   DR ROSS  . TEE WITHOUT CARDIOVERSION N/A 03/23/2013   Procedure: TRANSESOPHAGEAL ECHOCARDIOGRAM (TEE);  Surgeon: Fay Records, MD;  Location: [facility] ENDOSCOPY;  Service: Cardiovascular;  Laterality: N/A;  . TEE WITHOUT CARDIOVERSION N/A 03/26/2014   Procedure: TRANSESOPHAGEAL ECHOCARDIOGRAM (TEE);  Surgeon: Josue Hector, MD;  Location: [facility] ENDOSCOPY;  Service: Cardiovascular;  Laterality: N/A;  . TONSILLECTOMY       reports that he quit smoking about 5 years ago. His smoking use included Cigarettes. He started smoking about 13 years ago. He has a 7.00 pack-year smoking history. He has never used smokeless tobacco. He reports that he does not drink alcohol or use drugs.  No Known Allergies  Family History  Problem Relation Age of Onset  . CAD Father 8  . Diabetes Father   . Heart attack Father   . Transient ischemic attack Father   . Vascular Disease Father   . Diabetes Mother   . Anemia Mother   . Gout Mother   . Aneurysm Maternal Grandmother   . Stroke Maternal Grandfather   . Diabetes Paternal Grandmother   . Stroke Paternal Grandfather   . Diabetes Brother      Prior to Admission medications   Medication Sig Start Date End Date Taking? Authorizing Provider  acetaminophen (TYLENOL) 500 MG tablet Take 1,000 mg by mouth every 6 (six) hours as needed for headache.   Yes Historical Provider, MD  albuterol (PROVENTIL HFA) 108 (90 Base) MCG/ACT inhaler Inhale 2 puffs into the lungs every 4 (four)  hours as needed for wheezing or shortness of breath. 03/02/16  Yes [name], MD  atorvastatin (LIPITOR) 10 MG tablet Take 1 tablet (10 mg total) by mouth daily. 03/02/16  Yes [name], MD  Cholecalciferol (VITAMIN D) 2000 UNITS CAPS Take 2,000 Units by mouth daily.    Yes Historical Provider, MD  clopidogrel (PLAVIX) 75 MG tablet Take 1 tablet (75 mg total) by mouth daily with breakfast. 02/25/16  Yes Thompson Grayer, MD  dofetilide (TIKOSYN) 500 MCG capsule Take 1 capsule (500 mcg total) by mouth 2 (two) times daily. 06/13/16  Yes Brittainy M Simmons, PA-C  enalapril (VASOTEC) 10 MG tablet Take 1  tablet (10 mg total) by mouth 2 (two) times daily. 05/29/16  Yes Thompson Grayer, MD  Fluticasone-Salmeterol (ADVAIR DISKUS) 250-50 MCG/DOSE AEPB Inhale 1 puff into the lungs every 12 (twelve) hours. 03/02/16  Yes [name], MD  furosemide (LASIX) 40 MG tablet TAKE 1 TABLET BY MOUTH  DAILY 03/23/16  Yes [name], MD  MAGNESIUM PO Take 500 mg by mouth daily.    Yes Historical Provider, MD  metoprolol succinate (TOPROL-XL) 100 MG 24 hr tablet Take 1/2 tablet (50 mg) by mouth daily. Take with or immediately following a meal. 05/29/16  Yes Thompson Grayer, MD  nitroGLYCERIN (NITROSTAT) 0.4 MG SL tablet Place 1 tablet (0.4 mg total) under the tongue every 5 (five) minutes x 3 doses as needed for chest pain. 02/25/16  Yes Thompson Grayer, MD  oxybutynin (DITROPAN) 5 MG tablet Take 0.5 tablets (2.5 mg total) by mouth 2 (two) times daily as needed for bladder spasms. 04/01/16  Yes [name], MD  pantoprazole (PROTONIX) 40 MG tablet Take 1 tablet (40 mg total) by mouth daily. 02/25/16  Yes Thompson Grayer, MD  POTASSIUM PO Take 99 mg by mouth daily. OTC    Yes Historical Provider, MD  rivaroxaban (XARELTO) 20 MG TABS tablet Take 1 tablet (20 mg total) by mouth daily with supper. 02/25/16  Yes Thompson Grayer, MD  tadalafil (CIALIS) 5 MG tablet Take 1 tablet (5 mg total) by mouth daily as needed for erectile dysfunction. 03/02/16  Yes [name], MD  hydrocortisone (PROCTOZONE-HC) 2.5 % rectal cream Place 1 application rectally as needed (Use as directed).    Historical Provider, MD  ketoconazole (NIZORAL) 2 % cream Apply 1 application topically 2 (two) times daily as needed for irritation.    Historical Provider, MD    Physical Exam: Vitals:   07/10/16 1630 07/10/16 1700 07/10/16 1730 07/10/16 1800  BP: 126/72 102/71 102/62 122/77  Pulse: 69 63 60 61  Resp: 17 16 17 20   Temp:      TempSrc:      SpO2: 97% 97% 95% 97%  Weight:      Height:        Constitutional: NAD, calm,  comfortable Vitals:   07/10/16 1630 07/10/16 1700 07/10/16 1730 07/10/16 1800  BP: 126/72 102/71 102/62 122/77  Pulse: 69 63 60 61  Resp: 17 16 17 20   Temp:      TempSrc:      SpO2: 97% 97% 95% 97%  Weight:      Height:       Eyes: PERRL, lids and conjunctivae normal ENMT: Mucous membranes are moist. Posterior pharynx clear of any exudate or lesions.Normal dentition.  Neck: normal, supple, no masses, no thyromegaly Respiratory: clear to auscultation bilaterally, no wheezing, no crackles. Normal respiratory effort. No accessory muscle use.  Cardiovascular: Regular  rate and rhythm, no murmurs / rubs / gallops. No extremity edema. 2+ pedal pulses. No carotid bruits.  Abdomen: no tenderness, no masses palpated. No hepatosplenomegaly. Bowel sounds positive.  Musculoskeletal: no clubbing / cyanosis. No joint deformity upper and lower extremities. Good ROM, no contractures. Normal muscle tone.  Skin: no rashes, lesions, ulcers. No induration Neurologic: CN 2-12 grossly intact. Sensation intact, DTR normal. Strength 5/5 in all 4.  Psychiatric: Normal judgment and insight. Alert and oriented x 3. Normal mood.   Labs on Admission: I have personally reviewed following labs and imaging studies CBC:  Recent Labs Lab 07/10/16 1655  WBC 7.0  HGB 14.4  HCT 42.2  MCV 94.2  PLT 643   Basic Metabolic Panel:  Recent Labs Lab 07/10/16 1655  NA 137  K 4.0  CL 107  CO2 23  GLUCOSE 187*  BUN 13  CREATININE 0.97  CALCIUM 9.0   Cardiac Enzymes:  Recent Labs Lab 07/10/16 1655  TROPONINI <0.03   Urine analysis:    Component Value Date/Time   COLORURINE YELLOW 08/05/2012 [place] 08/05/2012 1212   LABSPEC 1.025 08/05/2012 1212   PHURINE 6.0 08/05/2012 1212   GLUCOSEU NEGATIVE 08/05/2012 1212   HGBUR TRACE (A) 08/05/2012 1212   BILIRUBINUR NEGATIVE 08/05/2012 [place] 08/05/2012 1212   PROTEINUR NEGATIVE 08/05/2012 1212   UROBILINOGEN 0.2  08/05/2012 1212   NITRITE NEGATIVE 08/05/2012 1212   LEUKOCYTESUR NEGATIVE 08/05/2012 1212    Radiological Exams on Admission: Ct Angio Chest Pe W And/or Wo Contrast  Result Date: 07/10/2016 CLINICAL DATA:  Dyspnea, chest pain, cardiac ablation December, 2017. EXAM: CT ANGIOGRAPHY CHEST WITH CONTRAST TECHNIQUE: Multidetector CT imaging of the chest was performed using the standard protocol during bolus administration of intravenous contrast. Multiplanar CT image reconstructions and MIPs were obtained to evaluate the vascular anatomy. CONTRAST:  100 cc Isovue 370 IV COMPARISON:  None. FINDINGS: Cardiovascular: Top normal size cardiac chambers without pericardial effusion. Three-vessel coronary arteriosclerosis is identified. The aorta is not aneurysmal and mildly uncoiled in appearance with aortic arch atherosclerosis. No aortic dissection. Bovine arch anatomy of the great vessels. No large central pulmonary embolus is identified. Mediastinum/Nodes: Substernal extension of the thyroid. Retrosternal thyroid isthmic nodule which appears solid measuring 18 x 13 mm, series 4, image 21. No mediastinal nor hilar lymphadenopathy. Lungs/Pleura: Lungs are clear. No pleural effusion or pneumothorax. Upper Abdomen: No acute abnormality. Musculoskeletal: No chest wall abnormality. No acute or significant osseous findings. Review of the MIP images confirms the above findings. IMPRESSION: No acute pulmonary embolus, aortic aneurysm or dissection. No active pulmonary disease. Thyroid isthmic solid-appearing 18 x 13 mm nodule within a retrosternal thyroid gland. Ultrasound may help for further correlation and to assess for nodules elsewhere within the thyroid. This follows ACR consensus guidelines: Managing Incidental Thyroid Nodules Detected on Imaging: White Paper of the ACR Incidental Thyroid Findings Committee. J Am Coll Radiol 2015; 12:143-150. Electronically Signed   By: Ashley Royalty M.D.   On: 07/10/2016 18:26     EKG: Independently reviewed.   Assessment/Plan Active Problems:   Atrial fibrillation (HCC)   WPW (Wolff-Parkinson-White syndrome)   Obstructive sleep apnea   Asthma, chronic   Persistent atrial fibrillation (HCC)   Chest pain   Chest pain in adult   Atypical chest pain    PLAN:   Atypical CP:  Known CAD, s/p stent to the LAD.  Pain free now.  Will continue with ASA, BB, statin.  Cycle  troponins. Afib:  In NSR now.  Continue with Tykosyn and Xarelto. Sleep Apnea:  s/p surgery.  Does not wear CPAP. HLD:  Continue with statin. Asthma:  Continue with Advair.    DVT prophylaxis: Xarelto.  Code Status:  FULL CODE.  Family Communication: wife at bedside.  Disposition Plan: Home.  Consults called: None. Admission status: OBS>    Ardelle Haliburton MD FACP. [facility]  If 7PM-7AM, please contact night-coverage [email or web address] Password [facility]  07/10/2016, 6:59 PM

## 2016-07-11 DIAGNOSIS — G4733 Obstructive sleep apnea (adult) (pediatric): Secondary | ICD-10-CM

## 2016-07-11 DIAGNOSIS — I480 Paroxysmal atrial fibrillation: Secondary | ICD-10-CM

## 2016-07-11 DIAGNOSIS — I456 Pre-excitation syndrome: Secondary | ICD-10-CM | POA: Diagnosis not present

## 2016-07-11 DIAGNOSIS — R0789 Other chest pain: Secondary | ICD-10-CM | POA: Diagnosis not present

## 2016-07-11 LAB — TROPONIN I: Troponin I: <0.03 ng/mL (ref <0.03)

## 2016-07-11 MED ORDER — HYDROXYZINE PAMOATE 25 MG PO CAPS: 50.0000 mg | ORAL_CAPSULE | Freq: Three times a day (TID) | ORAL | 0 refills | Status: AC | PRN

## 2016-07-11 NOTE — Progress Notes (Signed)
Discharge instructions reviewed with patient, wife at bedside, via teachback. Verbalized understanding of instructions, f/u appointments. Prescription sent to pharmacy per MD. Given AVS. IV sites d/c'd, sites within normal limits. Denies pain or discomfort at this time. Pt in stable condition awaiting w/c for discharge home. Donavan Foil, RN

## 2016-07-11 NOTE — Discharge Summary (Signed)
Physician Discharge Summary  Austin Woods KWI:[id number] DOB: 29-May-1965 DOA: 07/10/2016  PCP: Mickie Hillier, MD  Admit date: 07/10/2016 Discharge date: 07/11/2016  Admitted From: Home Disposition:  Home  Recommendations for Outpatient Follow-up:  1. Follow up with PCP in 1-2 weeks 2. Please obtain BMP/CBC in one week 3. Outpatient follow-up with cardiology  Discharge Condition:Stable CODE STATUS:Full Diet recommendation: Heart Healthy   Brief/Interim Summary: 51 year old male with a history of atrial fibrillation on Xarelto, presented to the hospital with complaints of atypical chest pain. Patient ruled out for ACS with negative cardiac markers and not ischemic changes on EKG . he reported that his pain was substernal in nature and nonradiating. Pain began on the morning prior to admission and persisted throughout the whole day. Patient was able to continue working at his job which is physically strenuous without his pain getting worse. He did report some improvement with nitroglycerin. He feels that his discomfort is related to stress. His mother recently passed away and there appears to be a lot of family dynamics/Protonix. Every time he thinks about these issues, he notices his discomfort gets worse. It does not appear that his pain is cardiac in nature and can likely be followed up as an outpatient. He'll be given a prescription for Vistaril for anxiety. Patient is otherwise stable for discharge.  Discharge Diagnoses:  Active Problems:   Atrial fibrillation (HCC)   WPW (Wolff-Parkinson-White syndrome)   Obstructive sleep apnea   Asthma, chronic   Persistent atrial fibrillation (HCC)   Chest pain   Chest pain in adult   Atypical chest pain    Discharge Instructions  Discharge Instructions    Diet - low sodium heart healthy    Complete by:  As directed    Increase activity slowly    Complete by:  As directed      Allergies as of 07/11/2016   No Known Allergies      Medication List    STOP taking these medications   tadalafil 5 MG tablet Commonly known as:  CIALIS     TAKE these medications   acetaminophen 500 MG tablet Commonly known as:  TYLENOL Take 1,000 mg by mouth every 6 (six) hours as needed for headache.   albuterol 108 (90 Base) MCG/ACT inhaler Commonly known as:  PROVENTIL HFA Inhale 2 puffs into the lungs every 4 (four) hours as needed for wheezing or shortness of breath.   atorvastatin 10 MG tablet Commonly known as:  LIPITOR Take 1 tablet (10 mg total) by mouth daily.   clopidogrel 75 MG tablet Commonly known as:  PLAVIX Take 1 tablet (75 mg total) by mouth daily with breakfast.   dofetilide 500 MCG capsule Commonly known as:  TIKOSYN Take 1 capsule (500 mcg total) by mouth 2 (two) times daily.   enalapril 10 MG tablet Commonly known as:  VASOTEC Take 1 tablet (10 mg total) by mouth 2 (two) times daily.   Fluticasone-Salmeterol 250-50 MCG/DOSE Aepb Commonly known as:  ADVAIR DISKUS Inhale 1 puff into the lungs every 12 (twelve) hours.   furosemide 40 MG tablet Commonly known as:  LASIX TAKE 1 TABLET BY MOUTH  DAILY   hydrOXYzine 25 MG capsule Commonly known as:  VISTARIL Take 2 capsules (50 mg total) by mouth 3 (three) times daily as needed for anxiety.   ketoconazole 2 % cream Commonly known as:  NIZORAL Apply 1 application topically 2 (two) times daily as needed for irritation.   MAGNESIUM PO Take 500 mg  by mouth daily.   metoprolol succinate 100 MG 24 hr tablet Commonly known as:  TOPROL-XL Take 1/2 tablet (50 mg) by mouth daily. Take with or immediately following a meal.   nitroGLYCERIN 0.4 MG SL tablet Commonly known as:  NITROSTAT Place 1 tablet (0.4 mg total) under the tongue every 5 (five) minutes x 3 doses as needed for chest pain.   oxybutynin 5 MG tablet Commonly known as:  DITROPAN Take 0.5 tablets (2.5 mg total) by mouth 2 (two) times daily as needed for bladder spasms.   pantoprazole 40  MG tablet Commonly known as:  PROTONIX Take 1 tablet (40 mg total) by mouth daily.   POTASSIUM PO Take 99 mg by mouth daily. OTC   PROCTOZONE-HC 2.5 % rectal cream Generic drug:  hydrocortisone Place 1 application rectally as needed (Use as directed).   rivaroxaban 20 MG Tabs tablet Commonly known as:  XARELTO Take 1 tablet (20 mg total) by mouth daily with supper.   Vitamin D 2000 units Caps Take 2,000 Units by mouth daily.      Follow-up Information    Mickie Hillier, MD. Schedule an appointment as soon as possible for a visit in 1 week(s).   Specialty:  Family Medicine Contact information: [place] [address] [phone]          No Known Allergies  Consultations:     Procedures/Studies: Ct Angio Chest Pe W And/or Wo Contrast  Result Date: 07/10/2016 CLINICAL DATA:  Dyspnea, chest pain, cardiac ablation December, 2017. EXAM: CT ANGIOGRAPHY CHEST WITH CONTRAST TECHNIQUE: Multidetector CT imaging of the chest was performed using the standard protocol during bolus administration of intravenous contrast. Multiplanar CT image reconstructions and MIPs were obtained to evaluate the vascular anatomy. CONTRAST:  100 cc Isovue 370 IV COMPARISON:  None. FINDINGS: Cardiovascular: Top normal size cardiac chambers without pericardial effusion. Three-vessel coronary arteriosclerosis is identified. The aorta is not aneurysmal and mildly uncoiled in appearance with aortic arch atherosclerosis. No aortic dissection. Bovine arch anatomy of the great vessels. No large central pulmonary embolus is identified. Mediastinum/Nodes: Substernal extension of the thyroid. Retrosternal thyroid isthmic nodule which appears solid measuring 18 x 13 mm, series 4, image 21. No mediastinal nor hilar lymphadenopathy. Lungs/Pleura: Lungs are clear. No pleural effusion or pneumothorax. Upper Abdomen: No acute abnormality. Musculoskeletal: No chest wall abnormality. No acute or  significant osseous findings. Review of the MIP images confirms the above findings. IMPRESSION: No acute pulmonary embolus, aortic aneurysm or dissection. No active pulmonary disease. Thyroid isthmic solid-appearing 18 x 13 mm nodule within a retrosternal thyroid gland. Ultrasound may help for further correlation and to assess for nodules elsewhere within the thyroid. This follows ACR consensus guidelines: Managing Incidental Thyroid Nodules Detected on Imaging: White Paper of the ACR Incidental Thyroid Findings Committee. J Am Coll Radiol 2015; 12:143-150. Electronically Signed   By: Ashley Royalty M.D.   On: 07/10/2016 18:26       Subjective: Feeling better. Feels chest discomfort comes back on when he thinks about his family issues. Feels that his chest discomfort is related to stress.  Discharge Exam: Vitals:   07/10/16 2143 07/11/16 0542  BP: 109/76 125/68  Pulse: (!) 55 60  Resp: 20 20  Temp: 97.7 F (36.5 C) 97.7 F (36.5 C)   Vitals:   07/10/16 2143 07/10/16 2152 07/11/16 0542 07/11/16 0718  BP: 109/76  125/68   Pulse: (!) 55  60   Resp: 20  20  Temp: 97.7 F (36.5 C)  97.7 F (36.5 C)   TempSrc: Oral  Oral   SpO2: 99% 98% 98% 98%  Weight:      Height:        General: Pt is alert, awake, not in acute distress Cardiovascular: RRR, S1/S2 +, no rubs, no gallops Respiratory: CTA bilaterally, no wheezing, no rhonchi Abdominal: Soft, NT, ND, bowel sounds + Extremities: no edema, no cyanosis    The results of significant diagnostics from this hospitalization (including imaging, microbiology, ancillary and laboratory) are listed below for reference.     Microbiology: No results found for this or any previous visit (from the past 240 hour(s)).   Labs: BNP (last 3 results) No results for input(s): BNP in the last 8760 hours. Basic Metabolic Panel:  Recent Labs Lab 07/10/16 1655  NA 137  K 4.0  CL 107  CO2 23  GLUCOSE 187*  BUN 13  CREATININE 0.97  CALCIUM 9.0    Liver Function Tests: No results for input(s): AST, ALT, ALKPHOS, BILITOT, PROT, ALBUMIN in the last 168 hours. No results for input(s): LIPASE, AMYLASE in the last 168 hours. No results for input(s): AMMONIA in the last 168 hours. CBC:  Recent Labs Lab 07/10/16 1655  WBC 7.0  HGB 14.4  HCT 42.2  MCV 94.2  PLT 236   Cardiac Enzymes:  Recent Labs Lab 07/10/16 1655 07/10/16 2042 07/11/16 0324 07/11/16 0806  TROPONINI <0.03 <0.03 <0.03 <0.03   BNP: Invalid input(s): POCBNP CBG: No results for input(s): GLUCAP in the last 168 hours. D-Dimer No results for input(s): DDIMER in the last 72 hours. Hgb A1c No results for input(s): HGBA1C in the last 72 hours. Lipid Profile No results for input(s): CHOL, HDL, LDLCALC, TRIG, CHOLHDL, LDLDIRECT in the last 72 hours. Thyroid function studies No results for input(s): TSH, T4TOTAL, T3FREE, THYROIDAB in the last 72 hours.  Invalid input(s): FREET3 Anemia work up No results for input(s): VITAMINB12, FOLATE, FERRITIN, TIBC, IRON, RETICCTPCT in the last 72 hours. Urinalysis    Component Value Date/Time   COLORURINE YELLOW 08/05/2012 [place] 08/05/2012 1212   LABSPEC 1.025 08/05/2012 1212   PHURINE 6.0 08/05/2012 1212   GLUCOSEU NEGATIVE 08/05/2012 1212   HGBUR TRACE (A) 08/05/2012 1212   BILIRUBINUR NEGATIVE 08/05/2012 1212   KETONESUR NEGATIVE 08/05/2012 1212   PROTEINUR NEGATIVE 08/05/2012 1212   UROBILINOGEN 0.2 08/05/2012 1212   NITRITE NEGATIVE 08/05/2012 1212   LEUKOCYTESUR NEGATIVE 08/05/2012 1212   Sepsis Labs Invalid input(s): PROCALCITONIN,  WBC,  LACTICIDVEN Microbiology No results found for this or any previous visit (from the past 240 hour(s)).   Time coordinating discharge: Over 30 minutes  SIGNED:   Kathie Dike, MD  [facility] 07/11/2016, 6:37 PM Pager   If 7PM-7AM, please contact night-coverage [email or web address] Password [email or web address]

## 2016-07-12 LAB — HIV ANTIBODY (ROUTINE TESTING W REFLEX): HIV SCREEN 4TH GENERATION: NONREACTIVE

## 2016-07-13 ENCOUNTER — Encounter: Payer: Self-pay | Admitting: Internal Medicine

## 2016-07-14 ENCOUNTER — Encounter (HOSPITAL_COMMUNITY): Payer: BLUE CROSS/BLUE SHIELD

## 2016-07-14 DIAGNOSIS — E119 Type 2 diabetes mellitus without complications: Secondary | ICD-10-CM | POA: Diagnosis not present

## 2016-07-14 DIAGNOSIS — E039 Hypothyroidism, unspecified: Secondary | ICD-10-CM | POA: Diagnosis not present

## 2016-07-20 ENCOUNTER — Ambulatory Visit (INDEPENDENT_AMBULATORY_CARE_PROVIDER_SITE_OTHER): Payer: BLUE CROSS/BLUE SHIELD | Admitting: *Deleted

## 2016-07-20 DIAGNOSIS — I4891 Unspecified atrial fibrillation: Secondary | ICD-10-CM | POA: Diagnosis not present

## 2016-07-20 NOTE — Progress Notes (Signed)
Carelink Summary Report / Loop Recorder 

## 2016-07-27 ENCOUNTER — Telehealth: Payer: Self-pay

## 2016-07-27 ENCOUNTER — Encounter: Payer: Self-pay | Admitting: Cardiovascular Disease

## 2016-07-27 ENCOUNTER — Encounter: Payer: Self-pay | Admitting: Vascular Surgery

## 2016-07-27 ENCOUNTER — Ambulatory Visit (INDEPENDENT_AMBULATORY_CARE_PROVIDER_SITE_OTHER): Payer: BLUE CROSS/BLUE SHIELD | Admitting: Cardiovascular Disease

## 2016-07-27 VITALS — BP 134/90 | HR 99 | Ht 73.0 in | Wt 282.0 lb

## 2016-07-27 DIAGNOSIS — I25118 Atherosclerotic heart disease of native coronary artery with other forms of angina pectoris: Secondary | ICD-10-CM | POA: Diagnosis not present

## 2016-07-27 DIAGNOSIS — I4891 Unspecified atrial fibrillation: Secondary | ICD-10-CM

## 2016-07-27 DIAGNOSIS — I724 Aneurysm of artery of lower extremity: Secondary | ICD-10-CM

## 2016-07-27 DIAGNOSIS — I5022 Chronic systolic (congestive) heart failure: Secondary | ICD-10-CM | POA: Diagnosis not present

## 2016-07-27 DIAGNOSIS — I729 Aneurysm of unspecified site: Secondary | ICD-10-CM | POA: Diagnosis not present

## 2016-07-27 DIAGNOSIS — Z955 Presence of coronary angioplasty implant and graft: Secondary | ICD-10-CM | POA: Diagnosis not present

## 2016-07-27 DIAGNOSIS — I10 Essential (primary) hypertension: Secondary | ICD-10-CM

## 2016-07-27 DIAGNOSIS — R1909 Other intra-abdominal and pelvic swelling, mass and lump: Secondary | ICD-10-CM

## 2016-07-27 NOTE — Telephone Encounter (Signed)
Pt. Called to report the hematoma is getting bigger in the right groin.  Reported he has noticed it has gotten tighter over the past couple weeks.  Stated if he lays on the right side, his leg will start to tingle.  Reported he saw Dr. Jacinta Shoe today, and advised to report to Dr. Trula Slade.  The pt. described that "it feels like a my heartbeat."  Discussed with Dr. Trula Slade.  Advised to schedule for Pseudoaneurysm U/S right groin today or tomorrow, and to see the PA.  Will contact pt. With appt. Information.

## 2016-07-27 NOTE — Progress Notes (Signed)
SUBJECTIVE: The patient is a 51 year old male with a history of persistent atrial fibrillation and a tachycardia mediated cardiomyopathy.  He failed multiple antiarrhythmics which included flecainide and dofetilide. He ultimately underwent radiofrequency ablation for atrial fibrillation in December 2014 by Dr. Rayann Heman. He also has a h/o WPW ablated in 2001, along with HTN and hyperlipidemia.  He underwent repeat a fib ablation 02/18/16 and developed a pseudoaneurysm which was injected with thrombin by vascular surgery.  He also has CAD and underwent PCI to prox/mid LAD on 02/03/16.  Echo 01/31/16: Mildly reduce LV systolic function, EF 30-09%, with apical hypokinesis.  He underwent loading with dofetilide again in January 2018.  He was hospitalized earlier this month for chest pain and ruled out for acute coronary syndrome with normal troponins and no ischemic changes on ECG. The patient thought his chest discomfort was related to stress as his mother had recently passed away and there were a lot of issues with family dynamics.  He has a right femoral pseudoaneurysm and follows with Dr. Trula Slade.  It is completely thrombosed. He has a follow-up appointment on May 7 with him for imaging.  He denies chest pain. He has occasional palpitations. His primary complaint relates to right groin swelling for the past month. He says he cannot sleep on that side and has developed some leg numbness.    Review of Systems: As per "subjective", otherwise negative.  No Known Allergies  Current Outpatient Prescriptions  Medication Sig Dispense Refill  . acetaminophen (TYLENOL) 500 MG tablet Take 1,000 mg by mouth every 6 (six) hours as needed for headache.    . albuterol (PROVENTIL HFA) 108 (90 Base) MCG/ACT inhaler Inhale 2 puffs into the lungs every 4 (four) hours as needed for wheezing or shortness of breath. 1 Inhaler 0  . atorvastatin (LIPITOR) 10 MG tablet Take 1 tablet (10 mg total) by  mouth daily. 30 tablet 0  . Cholecalciferol (VITAMIN D) 2000 UNITS CAPS Take 2,000 Units by mouth daily.     . clopidogrel (PLAVIX) 75 MG tablet Take 1 tablet (75 mg total) by mouth daily with breakfast. 90 tablet 3  . dofetilide (TIKOSYN) 500 MCG capsule Take 1 capsule (500 mcg total) by mouth 2 (two) times daily. 180 capsule 3  . enalapril (VASOTEC) 10 MG tablet Take 1 tablet (10 mg total) by mouth 2 (two) times daily. 180 tablet 3  . Fluticasone-Salmeterol (ADVAIR DISKUS) 250-50 MCG/DOSE AEPB Inhale 1 puff into the lungs every 12 (twelve) hours. 60 each 0  . furosemide (LASIX) 40 MG tablet TAKE 1 TABLET BY MOUTH  DAILY 30 tablet 0  . hydrocortisone (PROCTOZONE-HC) 2.5 % rectal cream Place 1 application rectally as needed (Use as directed).    . hydrOXYzine (VISTARIL) 25 MG capsule Take 2 capsules (50 mg total) by mouth 3 (three) times daily as needed for anxiety. 30 capsule 0  . ketoconazole (NIZORAL) 2 % cream Apply 1 application topically 2 (two) times daily as needed for irritation.    Marland Kitchen MAGNESIUM PO Take 500 mg by mouth daily.     . metoprolol succinate (TOPROL-XL) 100 MG 24 hr tablet Take 1/2 tablet (50 mg) by mouth daily. Take with or immediately following a meal. 30 tablet 3  . nitroGLYCERIN (NITROSTAT) 0.4 MG SL tablet Place 1 tablet (0.4 mg total) under the tongue every 5 (five) minutes x 3 doses as needed for chest pain. 75 tablet 1  . oxybutynin (DITROPAN) 5 MG tablet Take 0.5 tablets (  2.5 mg total) by mouth 2 (two) times daily as needed for bladder spasms. 45 tablet 3  . pantoprazole (PROTONIX) 40 MG tablet Take 1 tablet (40 mg total) by mouth daily. 90 tablet 3  . POTASSIUM PO Take 99 mg by mouth daily. OTC     . rivaroxaban (XARELTO) 20 MG TABS tablet Take 1 tablet (20 mg total) by mouth daily with supper. 90 tablet 3   No current facility-administered medications for this visit.     Past Medical History:  Diagnosis Date  . Asthma   . GERD (gastroesophageal reflux disease)     . Headache   . High cholesterol   . History of DVT (deep vein thrombosis)    a. left leg following ablation for SVT  . Hypertension   . Persistent atrial fibrillation ([place])    a. Dx 08/2012, xarelto initiated; b. 09/2012 Tikosyn initiated -> DCCV -> Sinus c. PVI 03/2013 d. PVI 03/2014  . Sleep apnea    does not use CPAP (he reports having uvulectomy)   . Wolff-Parkinson-White (WPW) syndrome    a. s/p RFCA 2001 by Dr Caryl Comes, pt reports complicated by post procedure DVT    Past Surgical History:  Procedure Laterality Date  . ATRIAL FIBRILLATION ABLATION  03/23/2013   PVI by DR Rayann Heman for afib  . ATRIAL FIBRILLATION ABLATION N/A 03/23/2013   Procedure: ATRIAL FIBRILLATION ABLATION;  Surgeon: Coralyn Mark, MD;  Location: [facility] CATH LAB;  Service: Cardiovascular;  Laterality: N/A;  . ATRIAL FIBRILLATION ABLATION N/A 03/27/2014   PVI Dr Rayann Heman  . BACK SURGERY    . CARDIAC CATHETERIZATION N/A 02/03/2016   Procedure: Left Heart Cath and Coronary Angiography;  Surgeon: Nelva Bush, MD;  Location: [place] CV LAB;  Service: Cardiovascular;  Laterality: N/A;  . CARDIAC CATHETERIZATION N/A 02/03/2016   Procedure: Intravascular Pressure Wire/FFR Study;  Surgeon: Nelva Bush, MD;  Location: [place] CV LAB;  Service: Cardiovascular;  Laterality: N/A;  . CARDIAC CATHETERIZATION N/A 02/03/2016   Procedure: Coronary Stent Intervention;  Surgeon: Nelva Bush, MD;  Location: [place] CV LAB;  Service: Cardiovascular;  Laterality: N/A;  . CARDIAC SURGERY    . CARDIOVERSION N/A 08/25/2012   Procedure: CARDIOVERSION;  Surgeon: Thayer Headings, MD;  Location: [facility] ENDOSCOPY;  Service: Cardiovascular;  Laterality: N/A;  . CARDIOVERSION N/A 09/06/2012   Procedure: CARDIOVERSION/Bedside;  Surgeon: Carlena Bjornstad, MD;  Location: [place] [facility];  Service: Cardiovascular;  Laterality: N/A;  . CARDIOVERSION N/A 01/12/2014   Procedure: CARDIOVERSION;  Surgeon: Pixie Casino, MD;  Location: [facility] ENDOSCOPY;   Service: Cardiovascular;  Laterality: N/A;  . CARDIOVERSION N/A 01/30/2016   Procedure: CARDIOVERSION;  Surgeon: Sueanne Margarita, MD;  Location: [facility] ENDOSCOPY;  Service: Cardiovascular;  Laterality: N/A;  . CARDIOVERSION N/A 04/14/2016   Procedure: CARDIOVERSION;  Surgeon: Pixie Casino, MD;  Location: [facility] ENDOSCOPY;  Service: Cardiovascular;  Laterality: N/A;  . COLONOSCOPY N/A 12/04/2015   Procedure: COLONOSCOPY;  Surgeon: Rogene Houston, MD;  [facility] ENDO [facility];  Service: Endoscopy;  Laterality: N/A;  730  . ELECTROPHYSIOLOGIC STUDY N/A 02/18/2016   Procedure: Atrial Fibrillation Ablation;  Surgeon: Thompson Grayer, MD;  Location: [place] CV LAB;  Service: Cardiovascular;  Laterality: N/A;  . LOOP RECORDER IMPLANT N/A 06/29/2014   Procedure: LOOP RECORDER IMPLANT;  Surgeon: Thompson Grayer, MD;  Location: [facility] CATH LAB;  Service: Cardiovascular;  Laterality: N/A;  . NOSE SURGERY     sleep apnea surgery  . PERIPHERAL VASCULAR CATHETERIZATION N/A  03/13/2016   Procedure: Thrombin Injection;  Surgeon: Serafina Mitchell, MD;  Location: [place] CV LAB;  Service: Cardiovascular;  Laterality: N/A;  . STOMACH SURGERY     reflux, fundoplication  . TEE WITHOUT CARDIOVERSION N/A 08/25/2012   Procedure: TRANSESOPHAGEAL ECHOCARDIOGRAM (TEE);  Surgeon: Thayer Headings, MD;  Location: [place];  Service: Cardiovascular;  Laterality: N/A;  . TEE WITHOUT CARDIOVERSION  03/23/2013   DR ROSS  . TEE WITHOUT CARDIOVERSION N/A 03/23/2013   Procedure: TRANSESOPHAGEAL ECHOCARDIOGRAM (TEE);  Surgeon: Fay Records, MD;  Location: [facility] ENDOSCOPY;  Service: Cardiovascular;  Laterality: N/A;  . TEE WITHOUT CARDIOVERSION N/A 03/26/2014   Procedure: TRANSESOPHAGEAL ECHOCARDIOGRAM (TEE);  Surgeon: Josue Hector, MD;  Location: [facility] ENDOSCOPY;  Service: Cardiovascular;  Laterality: N/A;  . TONSILLECTOMY      Social History   Social History  . Marital status: Married    Spouse name: N/A  . Number of children: 2  .  Years of education: N/A   Occupational History  .  [employer]   Social History Main Topics  . Smoking status: Former Smoker    Packs/day: 1.00    Years: 7.00    Types: Cigarettes    Start date: 05/19/2003    Quit date: 08/06/2010  . Smokeless tobacco: Never Used  . Alcohol use No  . Drug use: No  . Sexual activity: Not on file   Other Topics Concern  . Not on file   Social History Narrative   Pt lives in [place] with wife.  Works at [employer]:   07/27/16 0954  BP: 134/90  Pulse: 99  SpO2: 94%  Weight: 282 lb (127.9 kg)  Height: 6\' 1"  (1.854 m)    Wt Readings from Last 3 Encounters:  07/27/16 282 lb (127.9 kg)  07/10/16 273 lb (123.8 kg)  05/29/16 278 lb (126.1 kg)     PHYSICAL EXAM General: NAD HEENT: Normal. Neck: No JVD, no thyromegaly. Lungs: Clear to auscultation bilaterally with normal respiratory effort. CV: Nondisplaced PMI.  Regular rate and rhythm, normal S1/S2, no S3/S4, no murmur. No pretibial or periankle edema. +right groin swelling, mild tenderness, no ecchymoses.   Abdomen: Soft, nontender, obese.  Neurologic: Alert and oriented.  Psych: Normal affect. Skin: Normal. Musculoskeletal: No gross deformities.    ECG: Most recent ECG reviewed.   Labs: Lab Results  Component Value Date/Time   K 4.0 07/10/2016 04:55 PM   BUN 13 07/10/2016 04:55 PM   BUN 14 04/27/2016 11:29 AM   CREATININE 0.97 07/10/2016 04:55 PM   CREATININE 1.00 02/25/2015 01:06 PM   ALT 21 08/19/2015 10:15 AM   TSH 1.05 12/08/2012 04:48 PM   HGB 14.4 07/10/2016 04:55 PM     Lipids: Lab Results  Component Value Date/Time   LDLCALC 58 01/31/2016 03:08 AM   LDLCALC 88 08/19/2015 10:15 AM   CHOL 145 01/31/2016 03:08 AM   CHOL 156 08/19/2015 10:15 AM   TRIG 280 (H) 01/31/2016 03:08 AM   HDL 31 (L) 01/31/2016 03:08 AM   HDL 44 08/19/2015 10:15 AM       ASSESSMENT AND PLAN:  1. Atrial fibrillation s/p ablation with recurrence and palpitations: Stable. Remains  in a regular rhythm. Continue dofetilide and Toprol-XL. Remains on Xarelto for anticoagulation.  2. Cardiomyopathy/chronic systolic heart failure, EF 45-50%: Euvolemic on Lasix 40 mg daily. Continue Toprol-XL and enalapril.  3. HTN: Mildly elevated. Needs weight loss. No changes to meds.  4. Hyperlipidemia:  Continue Lipitor.  5. CAD with LAD stent: Symptomatically stable. Continue Lipitor, Plavix, and metoprolol. No aspirin as he is taking both Plavix and Xarelto.  6. Right groin swelling/femoral pseudoaneurysm: Encouraged to call his vascular surgeon. Has an appt on 5/7.   Disposition: Follow up 6 months  Kate Sable, M.D., F.A.C.C.

## 2016-07-27 NOTE — Patient Instructions (Signed)
Your physician wants you to follow-up in: 6 months You will receive a reminder letter in the mail two months in advance. If you don't receive a letter, please call our office to schedule the follow-up appointment.    If you need a refill on your cardiac medications before your next appointment, please call your pharmacy.    Thank you for choosing [facility] !

## 2016-07-28 ENCOUNTER — Ambulatory Visit (INDEPENDENT_AMBULATORY_CARE_PROVIDER_SITE_OTHER): Payer: BLUE CROSS/BLUE SHIELD | Admitting: Vascular Surgery

## 2016-07-28 ENCOUNTER — Ambulatory Visit (HOSPITAL_COMMUNITY)
Admission: RE | Admit: 2016-07-28 | Discharge: 2016-07-28 | Disposition: A | Discharge status: Home / Self Care | Payer: BLUE CROSS/BLUE SHIELD | Source: Ambulatory Visit | Attending: Vascular Surgery | Admitting: Vascular Surgery

## 2016-07-28 VITALS — BP 105/72 | HR 81 | Temp 97.8°F | Resp 20 | Ht 73.0 in | Wt 282.5 lb

## 2016-07-28 DIAGNOSIS — S301XXD Contusion of abdominal wall, subsequent encounter: Secondary | ICD-10-CM | POA: Diagnosis not present

## 2016-07-28 DIAGNOSIS — I724 Aneurysm of artery of lower extremity: Secondary | ICD-10-CM | POA: Diagnosis not present

## 2016-07-28 NOTE — Progress Notes (Signed)
[facility] [place]  Patient name: Austin Woods MRN: [id number] DOB: [birth year] Sex: male  REASON FOR VISIT: add-on  HPI: Austin Woods is a 51 y.o. male who presents as an add-on today for increased size and swelling to the right groin. The patient previously underwent thrombin injection to a right groin pseudoaneurysm back in December 2017. He developed a pseudoaneurysm following A. fib ablation PCI at Dr. Trula Slade. He did have a small residual pseudoaneurysm which Dr. Trula Slade has been following. At his last office visit, his pseudoaneurysm was completely thrombosed.   Past Medical History:  Diagnosis Date  . Asthma   . GERD (gastroesophageal reflux disease)   . Headache   . High cholesterol   . History of DVT (deep vein thrombosis)    a. left leg following ablation for SVT  . Hypertension   . Persistent atrial fibrillation ([place])    a. Dx 08/2012, xarelto initiated; b. 09/2012 Tikosyn initiated -> DCCV -> Sinus c. PVI 03/2013 d. PVI 03/2014  . Sleep apnea    does not use CPAP (he reports having uvulectomy)   . Wolff-Parkinson-White (WPW) syndrome    a. s/p RFCA 2001 by Dr Caryl Comes, pt reports complicated by post procedure DVT    Family History  Problem Relation Age of Onset  . CAD Father 79  . Diabetes Father   . Heart attack Father   . Transient ischemic attack Father   . Vascular Disease Father   . Diabetes Mother   . Anemia Mother   . Gout Mother   . Aneurysm Maternal Grandmother   . Stroke Maternal Grandfather   . Diabetes Paternal Grandmother   . Stroke Paternal Grandfather   . Diabetes Brother     SOCIAL HISTORY: Social History  Substance Use Topics  . Smoking status: Former Smoker    Packs/day: 1.00    Years: 7.00    Types: Cigarettes    Start date: 05/19/2003    Quit date: 08/06/2010  . Smokeless tobacco: Never Used  . Alcohol use No    No Known Allergies  Current Outpatient Prescriptions  Medication Sig Dispense Refill  .  acetaminophen (TYLENOL) 500 MG tablet Take 1,000 mg by mouth every 6 (six) hours as needed for headache.    . albuterol (PROVENTIL HFA) 108 (90 Base) MCG/ACT inhaler Inhale 2 puffs into the lungs every 4 (four) hours as needed for wheezing or shortness of breath. 1 Inhaler 0  . atorvastatin (LIPITOR) 10 MG tablet Take 1 tablet (10 mg total) by mouth daily. 30 tablet 0  . Cholecalciferol (VITAMIN D) 2000 UNITS CAPS Take 2,000 Units by mouth daily.     . clopidogrel (PLAVIX) 75 MG tablet Take 1 tablet (75 mg total) by mouth daily with breakfast. 90 tablet 3  . dofetilide (TIKOSYN) 500 MCG capsule Take 1 capsule (500 mcg total) by mouth 2 (two) times daily. 180 capsule 3  . enalapril (VASOTEC) 10 MG tablet Take 1 tablet (10 mg total) by mouth 2 (two) times daily. 180 tablet 3  . Fluticasone-Salmeterol (ADVAIR DISKUS) 250-50 MCG/DOSE AEPB Inhale 1 puff into the lungs every 12 (twelve) hours. 60 each 0  . furosemide (LASIX) 40 MG tablet TAKE 1 TABLET BY MOUTH  DAILY 30 tablet 0  . hydrocortisone (PROCTOZONE-HC) 2.5 % rectal cream Place 1 application rectally as needed (Use as directed).    . hydrOXYzine (VISTARIL) 25 MG capsule Take 2 capsules (50 mg total) by mouth 3 (three) times daily  as needed for anxiety. 30 capsule 0  . ketoconazole (NIZORAL) 2 % cream Apply 1 application topically 2 (two) times daily as needed for irritation.    Marland Kitchen MAGNESIUM PO Take 500 mg by mouth daily.     . metoprolol succinate (TOPROL-XL) 100 MG 24 hr tablet Take 1/2 tablet (50 mg) by mouth daily. Take with or immediately following a meal. 30 tablet 3  . nitroGLYCERIN (NITROSTAT) 0.4 MG SL tablet Place 1 tablet (0.4 mg total) under the tongue every 5 (five) minutes x 3 doses as needed for chest pain. 75 tablet 1  . oxybutynin (DITROPAN) 5 MG tablet Take 0.5 tablets (2.5 mg total) by mouth 2 (two) times daily as needed for bladder spasms. 45 tablet 3  . pantoprazole (PROTONIX) 40 MG tablet Take 1 tablet (40 mg total) by mouth  daily. 90 tablet 3  . POTASSIUM PO Take 99 mg by mouth daily. OTC     . rivaroxaban (XARELTO) 20 MG TABS tablet Take 1 tablet (20 mg total) by mouth daily with supper. 90 tablet 3   No current facility-administered medications for this visit.     REVIEW OF SYSTEMS:  [X]  denotes positive finding, [ ]  denotes negative finding Cardiac  Comments:  Chest pain or chest pressure:    Shortness of breath upon exertion:    Short of breath when lying flat:    Irregular heart rhythm:        Vascular    Pain in calf, thigh, or hip brought on by ambulation:    Pain in feet at night that wakes you up from your sleep:     Blood clot in your veins:    Leg swelling:         Pulmonary    Oxygen at home:    Productive cough:     Wheezing:         Neurologic    Sudden weakness in arms or legs:     Sudden numbness in arms or legs:     Sudden onset of difficulty speaking or slurred speech:    Temporary loss of vision in one eye:     Problems with dizziness:         Gastrointestinal    Blood in stool:     Vomited blood:         Genitourinary    Burning when urinating:     Blood in urine:        Psychiatric    Major depression:         Hematologic    Bleeding problems:    Problems with blood clotting too easily:        Skin    Rashes or ulcers:        Constitutional    Fever or chills:      PHYSICAL EXAM: Vitals:   07/28/16 1551  BP: 105/72  Pulse: 81  Resp: 20  Temp: 97.8 F (36.6 C)  TempSrc: Oral  SpO2: 93%  Weight: 282 lb 8 oz (128.1 kg)  Height: 6\' 1"  (1.854 m)    GENERAL: The patient is a well-nourished male, in no acute distress. The vital signs are documented above. HEENT: normocephalic, atraumatic. No abnormalities noted.  VASCULAR: Right groin without pulsatile mass. Palpable hematoma. No skin changes. Palpable right pedal pulses.  PULMONARY: Nonlabored respiratory effort. MUSCULOSKELETAL: There are no major deformities or cyanosis. NEUROLOGIC: No focal  weakness or paresthesias are detected. SKIN: There are no ulcers or rashes noted.  PSYCHIATRIC: The patient has a normal affect.  DATA:  Lower extremity arterial duplex 07/24/2016  No residual pseudoaneurysm noted. Right groin hematoma has decreased in size to 5 cm. Previously 7 cm in February.  MEDICAL ISSUES: Right groin hematoma  There is no residual pseudoaneurysm on duplex today. Dr. Donnetta Hutching also saw and evaluated patient. Discussed that it will take time for his right groin hematoma to resolve. His hematoma has decreased in size since his last study 2 months ago. The pain that he is likely having is due to nerve compression from his hematoma. He will follow-up as needed. Discussed that he may return to work.    Virgina Jock, PA-C [facility] MD: Early.

## 2016-07-29 ENCOUNTER — Other Ambulatory Visit: Payer: Self-pay | Admitting: Family Medicine

## 2016-07-29 NOTE — Telephone Encounter (Signed)
One mo all rec f u o v

## 2016-07-29 NOTE — Telephone Encounter (Signed)
May we refill patient's potassium

## 2016-08-01 LAB — CUP PACEART REMOTE DEVICE CHECK
Date Time Interrogation Session: 20180414214419
Implantable Pulse Generator Implant Date: 20160325

## 2016-08-01 NOTE — Progress Notes (Signed)
Carelink summary report received. Battery status OK. Normal device function. No new symptom episodes, tachy episodes, brady, or pause episodes. 12 AF 1.5% 1 w/ ECG appears artifact. +Tikosyn +Xarelto. Monthly summary reports and ROV/PRN

## 2016-08-02 ENCOUNTER — Other Ambulatory Visit: Payer: Self-pay | Admitting: Internal Medicine

## 2016-08-10 ENCOUNTER — Ambulatory Visit: Payer: BLUE CROSS/BLUE SHIELD | Admitting: Surgery

## 2016-08-10 ENCOUNTER — Ambulatory Visit (HOSPITAL_COMMUNITY): Payer: BLUE CROSS/BLUE SHIELD

## 2016-08-10 ENCOUNTER — Other Ambulatory Visit: Payer: Self-pay | Admitting: Family Medicine

## 2016-08-17 ENCOUNTER — Ambulatory Visit (INDEPENDENT_AMBULATORY_CARE_PROVIDER_SITE_OTHER): Payer: BLUE CROSS/BLUE SHIELD | Admitting: *Deleted

## 2016-08-17 DIAGNOSIS — I4891 Unspecified atrial fibrillation: Secondary | ICD-10-CM | POA: Diagnosis not present

## 2016-08-18 NOTE — Progress Notes (Signed)
Carelink Summary Report / Loop Recorder 

## 2016-08-21 LAB — CUP PACEART REMOTE DEVICE CHECK
Implantable Pulse Generator Implant Date: 20160325
MDC IDC SESS DTM: 20180514214006

## 2016-08-26 ENCOUNTER — Encounter (HOSPITAL_COMMUNITY): Payer: Self-pay | Admitting: Nurse Practitioner

## 2016-08-26 ENCOUNTER — Ambulatory Visit (HOSPITAL_COMMUNITY)
Admission: RE | Admit: 2016-08-26 | Discharge: 2016-08-26 | Disposition: A | Discharge status: Home / Self Care | Payer: BLUE CROSS/BLUE SHIELD | Source: Ambulatory Visit | Attending: Nurse Practitioner | Admitting: Nurse Practitioner

## 2016-08-26 VITALS — BP 126/84 | HR 69 | Ht 73.0 in | Wt 283.0 lb

## 2016-08-26 DIAGNOSIS — J45909 Unspecified asthma, uncomplicated: Secondary | ICD-10-CM | POA: Insufficient documentation

## 2016-08-26 DIAGNOSIS — Z7902 Long term (current) use of antithrombotics/antiplatelets: Secondary | ICD-10-CM | POA: Diagnosis not present

## 2016-08-26 DIAGNOSIS — I2510 Atherosclerotic heart disease of native coronary artery without angina pectoris: Secondary | ICD-10-CM | POA: Insufficient documentation

## 2016-08-26 DIAGNOSIS — Z86718 Personal history of other venous thrombosis and embolism: Secondary | ICD-10-CM | POA: Diagnosis not present

## 2016-08-26 DIAGNOSIS — K219 Gastro-esophageal reflux disease without esophagitis: Secondary | ICD-10-CM | POA: Diagnosis not present

## 2016-08-26 DIAGNOSIS — I471 Supraventricular tachycardia: Secondary | ICD-10-CM | POA: Insufficient documentation

## 2016-08-26 DIAGNOSIS — E7800 Pure hypercholesterolemia, unspecified: Secondary | ICD-10-CM | POA: Diagnosis not present

## 2016-08-26 DIAGNOSIS — Z9889 Other specified postprocedural states: Secondary | ICD-10-CM | POA: Diagnosis not present

## 2016-08-26 DIAGNOSIS — I456 Pre-excitation syndrome: Secondary | ICD-10-CM | POA: Insufficient documentation

## 2016-08-26 DIAGNOSIS — Z7901 Long term (current) use of anticoagulants: Secondary | ICD-10-CM | POA: Insufficient documentation

## 2016-08-26 DIAGNOSIS — I4819 Other persistent atrial fibrillation: Secondary | ICD-10-CM

## 2016-08-26 DIAGNOSIS — I481 Persistent atrial fibrillation: Secondary | ICD-10-CM | POA: Diagnosis not present

## 2016-08-26 DIAGNOSIS — I728 Aneurysm of other specified arteries: Secondary | ICD-10-CM | POA: Insufficient documentation

## 2016-08-26 DIAGNOSIS — Z8249 Family history of ischemic heart disease and other diseases of the circulatory system: Secondary | ICD-10-CM | POA: Diagnosis not present

## 2016-08-26 DIAGNOSIS — Z79899 Other long term (current) drug therapy: Secondary | ICD-10-CM | POA: Diagnosis not present

## 2016-08-26 DIAGNOSIS — Z87891 Personal history of nicotine dependence: Secondary | ICD-10-CM | POA: Insufficient documentation

## 2016-08-26 DIAGNOSIS — I10 Essential (primary) hypertension: Secondary | ICD-10-CM | POA: Insufficient documentation

## 2016-08-26 DIAGNOSIS — Z823 Family history of stroke: Secondary | ICD-10-CM | POA: Diagnosis not present

## 2016-08-26 DIAGNOSIS — Z833 Family history of diabetes mellitus: Secondary | ICD-10-CM | POA: Diagnosis not present

## 2016-08-26 LAB — BASIC METABOLIC PANEL
Anion gap: 8 (ref 5–15)
BUN: 9 mg/dL (ref 6–20)
CALCIUM: 9 mg/dL (ref 8.9–10.3)
CO2: 26 mmol/L (ref 22–32)
CREATININE: 1.16 mg/dL (ref 0.61–1.24)
Chloride: 107 mmol/L (ref 101–111)
GFR calc Af Amer: >60 mL/min (ref >60)
GLUCOSE: 107 mg/dL — AB (ref 65–99)
Potassium: 3.9 mmol/L (ref 3.5–5.1)
Sodium: 141 mmol/L (ref 135–145)

## 2016-08-26 LAB — MAGNESIUM: Magnesium: 1.9 mg/dL (ref 1.7–2.4)

## 2016-08-26 NOTE — Progress Notes (Signed)
Primary Care Physician: [name], MD EP Physician: Dr. Rayann Heman Cardiologist: Dr. Cecile Sheerer is a 51 y.o. male with a h/o afib with third ablation performed 02/18/16. He had cardioversion 10/26, and c/o of chest pain after the procedure. He was admitted and underwent cardiac catherization 02/03/16 and received a stent of the LAD.   He was seen couple of weeks after ablation, due to continued pain of rt groin subsequent to hematoma at time of procedure and feeling that the pain at the groin would keep him from doing his job. He was sent for u/s of rt groin, which showed a large pseudoaneurysm, he was admitted and additional pressure was applied for several hours..  He was also in rapid afib at 138 bpm, but he was unaware of this. He has not been checking his HR/BP at home and did not know if the afib was persistent.  The continued groin discomfort is more of a concern to him right now.  F/u 12/22, since being seen in the clinic, he underwent treatment of pseudoaneurysm of rt groin with thrombin injection, which is still not resolved and will f/u with Dr. Trula Slade. He continued in afib and had unsuccessful cardioversion 04/14/16.   He was admitted to [facility] 1/22 thru 1/25 for tikosyn and is maintaining SR and in the afib clinic, 2/1, for f/u. He feels improved.  F/u in the afib clinic. He is staying mostly in [place]. He has some continuation of hematoma  Rt groin which is slowing improving. Last checked by vascular 4/24.Since I saw him last his mother died and he presented to ER, 2022/07/21, with atypical chest pain. He r/o for ACS and pain was thought secondary to stress. He is back to work.  Today, he denies symptoms of   chest pain , orthopnea, PND, lower extremity edema, dizziness, presyncope, syncope, or neurologic sequela. Positive for shortness of breath, fatigue, anxious feeling. The patient is tolerating medications without difficulties and is otherwise without  complaint today.   Past Medical History:  Diagnosis Date  . Asthma   . GERD (gastroesophageal reflux disease)   . Headache   . High cholesterol   . History of DVT (deep vein thrombosis)    a. left leg following ablation for SVT  . Hypertension   . Persistent atrial fibrillation ([place])    a. Dx 08/2012, xarelto initiated; b. 09/2012 Tikosyn initiated -> DCCV -> Sinus c. PVI 03/2013 d. PVI 03/2014  . Sleep apnea    does not use CPAP (he reports having uvulectomy)   . Wolff-Parkinson-White (WPW) syndrome    a. s/p RFCA 2001 by Dr Caryl Comes, pt reports complicated by post procedure DVT   Past Surgical History:  Procedure Laterality Date  . ATRIAL FIBRILLATION ABLATION  03/23/2013   PVI by DR Rayann Heman for afib  . ATRIAL FIBRILLATION ABLATION N/A 03/23/2013   Procedure: ATRIAL FIBRILLATION ABLATION;  Surgeon: Coralyn Mark, MD;  Location: [place] CATH LAB;  Service: Cardiovascular;  Laterality: N/A;  . ATRIAL FIBRILLATION ABLATION N/A 03/27/2014   PVI Dr Rayann Heman  . BACK SURGERY    . CARDIAC CATHETERIZATION N/A 02/03/2016   Procedure: Left Heart Cath and Coronary Angiography;  Surgeon: Nelva Bush, MD;  Location: [place] CV LAB;  Service: Cardiovascular;  Laterality: N/A;  . CARDIAC CATHETERIZATION N/A 02/03/2016   Procedure: Intravascular Pressure Wire/FFR Study;  Surgeon: Nelva Bush, MD;  Location: [place] CV LAB;  Service: Cardiovascular;  Laterality: N/A;  .  CARDIAC CATHETERIZATION N/A 02/03/2016   Procedure: Coronary Stent Intervention;  Surgeon: Nelva Bush, MD;  Location: [place] CV LAB;  Service: Cardiovascular;  Laterality: N/A;  . CARDIAC SURGERY    . CARDIOVERSION N/A 08/25/2012   Procedure: CARDIOVERSION;  Surgeon: Thayer Headings, MD;  Location[facility] ENDOSCOPY;  Service: Cardiovascular;  Laterality: N/A;  . CARDIOVERSION N/A 09/06/2012   Procedure: CARDIOVERSION/Bedside;  Surgeon: Carlena Bjornstad, MD;  Location: [place];  Service: Cardiovascular;  Laterality: N/A;  .  CARDIOVERSION N/A 01/12/2014   Procedure: CARDIOVERSION;  Surgeon: Pixie Casino, MD;  Location: [facility] ENDOSCOPY;  Service: Cardiovascular;  Laterality: N/A;  . CARDIOVERSION N/A 01/30/2016   Procedure: CARDIOVERSION;  Surgeon: Sueanne Margarita, MD;  Location: [facility] ENDOSCOPY;  Service: Cardiovascular;  Laterality: N/A;  . CARDIOVERSION N/A 04/14/2016   Procedure: CARDIOVERSION;  Surgeon: Pixie Casino, MD;  Location: [facility] ENDOSCOPY;  Service: Cardiovascular;  Laterality: N/A;  . COLONOSCOPY N/A 12/04/2015   Procedure: COLONOSCOPY;  Surgeon: Rogene Houston, MD;  Location[facility] ENDO [facility];  Service: Endoscopy;  Laterality: N/A;  730  . ELECTROPHYSIOLOGIC STUDY N/A 02/18/2016   Procedure: Atrial Fibrillation Ablation;  Surgeon: Thompson Grayer, MD;  Location: [place] CV LAB;  Service: Cardiovascular;  Laterality: N/A;  . LOOP RECORDER IMPLANT N/A 06/29/2014   Procedure: LOOP RECORDER IMPLANT;  Surgeon: Thompson Grayer, MD;  Location: [facility] CATH LAB;  Service: Cardiovascular;  Laterality: N/A;  . NOSE SURGERY     sleep apnea surgery  . PERIPHERAL VASCULAR CATHETERIZATION N/A 03/13/2016   Procedure: Thrombin Injection;  Surgeon: Serafina Mitchell, MD;  Location: [place] CV LAB;  Service: Cardiovascular;  Laterality: N/A;  . STOMACH SURGERY     reflux, fundoplication  . TEE WITHOUT CARDIOVERSION N/A 08/25/2012   Procedure: TRANSESOPHAGEAL ECHOCARDIOGRAM (TEE);  Surgeon: Thayer Headings, MD;  Location: [place];  Service: Cardiovascular;  Laterality: N/A;  . TEE WITHOUT CARDIOVERSION  03/23/2013   DR ROSS  . TEE WITHOUT CARDIOVERSION N/A 03/23/2013   Procedure: TRANSESOPHAGEAL ECHOCARDIOGRAM (TEE);  Surgeon: Fay Records, MD;  Location: [facility] ENDOSCOPY;  Service: Cardiovascular;  Laterality: N/A;  . TEE WITHOUT CARDIOVERSION N/A 03/26/2014   Procedure: TRANSESOPHAGEAL ECHOCARDIOGRAM (TEE);  Surgeon: Josue Hector, MD;  Location: [facility] ENDOSCOPY;  Service: Cardiovascular;  Laterality: N/A;  . TONSILLECTOMY       Current Outpatient Prescriptions  Medication Sig Dispense Refill  . acetaminophen (TYLENOL) 500 MG tablet Take 1,000 mg by mouth every 6 (six) hours as needed for headache.    . albuterol (PROVENTIL HFA) 108 (90 Base) MCG/ACT inhaler Inhale 2 puffs into the lungs every 4 (four) hours as needed for wheezing or shortness of breath. 1 Inhaler 0  . atorvastatin (LIPITOR) 10 MG tablet Take 1 tablet (10 mg total) by mouth daily. 30 tablet 0  . Cholecalciferol (VITAMIN D) 2000 UNITS CAPS Take 2,000 Units by mouth daily.     . clopidogrel (PLAVIX) 75 MG tablet Take 1 tablet (75 mg total) by mouth daily with breakfast. 90 tablet 3  . dofetilide (TIKOSYN) 500 MCG capsule Take 1 capsule (500 mcg total) by mouth 2 (two) times daily. 180 capsule 3  . enalapril (VASOTEC) 10 MG tablet Take 1 tablet (10 mg total) by mouth 2 (two) times daily. 180 tablet 3  . Fluticasone-Salmeterol (ADVAIR DISKUS) 250-50 MCG/DOSE AEPB Inhale 1 puff into the lungs every 12 (twelve) hours. 60 each 0  . furosemide (LASIX) 40 MG tablet TAKE 1 TABLET BY MOUTH  DAILY  7 tablet 0  . hydrocortisone (PROCTOZONE-HC) 2.5 % rectal cream Place 1 application rectally as needed (Use as directed).    . hydrOXYzine (VISTARIL) 25 MG capsule Take 2 capsules (50 mg total) by mouth 3 (three) times daily as needed for anxiety. 30 capsule 0  . ketoconazole (NIZORAL) 2 % cream Apply 1 application topically 2 (two) times daily as needed for irritation.    Marland Kitchen MAGNESIUM PO Take 500 mg by mouth daily.     . metoprolol succinate (TOPROL-XL) 100 MG 24 hr tablet Take 1/2 tablet (50 mg) by mouth daily. Take with or immediately following a meal. 30 tablet 3  . nitroGLYCERIN (NITROSTAT) 0.4 MG SL tablet Place 1 tablet (0.4 mg total) under the tongue every 5 (five) minutes x 3 doses as needed for chest pain. 75 tablet 1  . oxybutynin (DITROPAN) 5 MG tablet TAKE ONE-HALF TABLET BY  MOUTH TWO TIMES DAILY AS  NEEDED FOR BLADDER SPASMS 30 tablet 0  . pantoprazole  (PROTONIX) 40 MG tablet Take 1 tablet (40 mg total) by mouth daily. 90 tablet 3  . potassium chloride SA (K-DUR,KLOR-CON) 20 MEQ tablet TAKE 1 TABLET BY MOUTH  EVERY DAY 30 tablet 0  . POTASSIUM PO Take 99 mg by mouth daily. OTC     . rivaroxaban (XARELTO) 20 MG TABS tablet Take 1 tablet (20 mg total) by mouth daily with supper. 90 tablet 3   No current facility-administered medications for this encounter.     No Known Allergies  Social History   Social History  . Marital status: Married    Spouse name: N/A  . Number of children: 2  . Years of education: N/A   Occupational History  .  [employer]   Social History Main Topics  . Smoking status: Former Smoker    Packs/day: 1.00    Years: 7.00    Types: Cigarettes    Start date: 05/19/2003    Quit date: 08/06/2010  . Smokeless tobacco: Never Used  . Alcohol use No  . Drug use: No  . Sexual activity: Not on file   Other Topics Concern  . Not on file   Social History Narrative   Pt lives in [place] with wife.  Works at [employer] History  Problem Relation Age of Onset  . CAD Father 6  . Diabetes Father   . Heart attack Father   . Transient ischemic attack Father   . Vascular Disease Father   . Diabetes Mother   . Anemia Mother   . Gout Mother   . Aneurysm Maternal Grandmother   . Stroke Maternal Grandfather   . Diabetes Paternal Grandmother   . Stroke Paternal Grandfather   . Diabetes Brother     ROS- All systems are reviewed and negative except as per the HPI above  Physical Exam: Vitals:   08/26/16 0900  BP: 126/84  Pulse: 69  Weight: 283 lb (128.4 kg)  Height: 6\' 1"  (1.854 m)    GEN- The patient is well appearing, alert and oriented x 3 today.   Head- normocephalic, atraumatic Eyes-  Sclera clear, conjunctiva pink Ears- hearing intact Oropharynx- clear Neck- supple, no JVP Lymph- no cervical lymphadenopathy Lungs- Clear to ausculation bilaterally, normal work of breathing Heart-  regular rate  and rhythm, no murmurs, rubs or gallops, PMI not laterally displaced GI- soft, NT, ND, + BS Extremities- no clubbing, cyanosis, or edema.   MS- no significant deformity or atrophy Skin- no  rash or lesion Psych- euthymic mood, full affect Neuro- strength and sensation are intact  EKG-NSR at 80 bpm, pr int 168 ms, qrs int 100 ms, qtc 491 ms(stable) Epic records reviewed  Assessment and Plan: 1.Persisitent Afib, s/p ablation Maintaining SR on tikosyn Continue tikosyn at 500 mcg bid Continue metoprolol No missed doses of xarelto, no bleeding issues Bmet/mag   2. Rt groin pseudoaneurysm  Per Dr. Trula Slade  Slowly improving  3. CAD Stable Stent placement of LAD 10/30 Continue plavix  Continue BB, statin Per Dr. Bronson Ing   F/u with Dr. Rayann Heman  August  Dr. Bronson Ing in October afib clinic as needed   Geroge Baseman. Michaelangelo Mittelman, [facility] [address] [place], [place] [address] [phone]

## 2016-09-16 ENCOUNTER — Ambulatory Visit (INDEPENDENT_AMBULATORY_CARE_PROVIDER_SITE_OTHER): Payer: BLUE CROSS/BLUE SHIELD | Admitting: *Deleted

## 2016-09-16 DIAGNOSIS — I481 Persistent atrial fibrillation: Secondary | ICD-10-CM

## 2016-09-16 DIAGNOSIS — I4819 Other persistent atrial fibrillation: Secondary | ICD-10-CM

## 2016-09-17 NOTE — Progress Notes (Signed)
Carelink Summary Report / Loop Recorder 

## 2016-09-24 DIAGNOSIS — Z713 Dietary counseling and surveillance: Secondary | ICD-10-CM | POA: Diagnosis not present

## 2016-09-24 DIAGNOSIS — E669 Obesity, unspecified: Secondary | ICD-10-CM | POA: Diagnosis not present

## 2016-09-24 DIAGNOSIS — Z6836 Body mass index (BMI) 36.0-36.9, adult: Secondary | ICD-10-CM | POA: Diagnosis not present

## 2016-09-27 LAB — CUP PACEART REMOTE DEVICE CHECK
MDC IDC PG IMPLANT DT: 20160325
MDC IDC SESS DTM: 20180613221007

## 2016-09-27 NOTE — Progress Notes (Signed)
Carelink summary report received. Battery status OK. Normal device function. No new symptom episodes, tachy episodes, brady, or pause episodes. 19 AF per device 1.7% +tikosyn +xarelto.  Monthly summary reports and ROV/PRN

## 2016-09-29 ENCOUNTER — Other Ambulatory Visit: Payer: Self-pay | Admitting: Family Medicine

## 2016-09-29 ENCOUNTER — Other Ambulatory Visit: Payer: Self-pay | Admitting: Physician Assistant

## 2016-10-04 ENCOUNTER — Other Ambulatory Visit: Payer: Self-pay | Admitting: Family Medicine

## 2016-10-12 ENCOUNTER — Other Ambulatory Visit: Payer: Self-pay | Admitting: Family Medicine

## 2016-10-15 DIAGNOSIS — Z7189 Other specified counseling: Secondary | ICD-10-CM | POA: Diagnosis not present

## 2016-10-15 DIAGNOSIS — I10 Essential (primary) hypertension: Secondary | ICD-10-CM | POA: Diagnosis not present

## 2016-10-15 DIAGNOSIS — K219 Gastro-esophageal reflux disease without esophagitis: Secondary | ICD-10-CM | POA: Diagnosis not present

## 2016-10-15 DIAGNOSIS — R350 Frequency of micturition: Secondary | ICD-10-CM | POA: Diagnosis not present

## 2016-10-16 ENCOUNTER — Ambulatory Visit (INDEPENDENT_AMBULATORY_CARE_PROVIDER_SITE_OTHER): Payer: BLUE CROSS/BLUE SHIELD | Admitting: *Deleted

## 2016-10-16 DIAGNOSIS — I480 Paroxysmal atrial fibrillation: Secondary | ICD-10-CM

## 2016-10-19 NOTE — Progress Notes (Signed)
Carelink Summary Report / Loop Recorder 

## 2016-10-22 LAB — CUP PACEART REMOTE DEVICE CHECK
Date Time Interrogation Session: 20180713224311
MDC IDC PG IMPLANT DT: 20160325

## 2016-11-10 ENCOUNTER — Telehealth: Payer: Self-pay | Admitting: Internal Medicine

## 2016-11-10 NOTE — Telephone Encounter (Signed)
Spoke with pt who is reporting he is having swelling and tenderness again in his right groin.  It started to bother him again in July and has been getting worse.  It hurts to lay on his right side for very long and his leg goes to sleep.  He denies redness and/or drainage.  Reviewed with Marinus Maw, PA who states pt should f/u with Vascular and Vein for further evaluation.  Pt is aware and states he will call their office.

## 2016-11-10 NOTE — Telephone Encounter (Signed)
New message    Pt states that he is having pain in his groin from his surgery. He said there is a knot and it's painful. Please call.

## 2016-11-13 DIAGNOSIS — E119 Type 2 diabetes mellitus without complications: Secondary | ICD-10-CM | POA: Diagnosis not present

## 2016-11-13 DIAGNOSIS — E039 Hypothyroidism, unspecified: Secondary | ICD-10-CM | POA: Diagnosis not present

## 2016-11-16 ENCOUNTER — Ambulatory Visit (INDEPENDENT_AMBULATORY_CARE_PROVIDER_SITE_OTHER): Payer: BLUE CROSS/BLUE SHIELD | Admitting: *Deleted

## 2016-11-16 DIAGNOSIS — I481 Persistent atrial fibrillation: Secondary | ICD-10-CM

## 2016-11-16 DIAGNOSIS — I4819 Other persistent atrial fibrillation: Secondary | ICD-10-CM

## 2016-11-20 DIAGNOSIS — E039 Hypothyroidism, unspecified: Secondary | ICD-10-CM | POA: Diagnosis not present

## 2016-11-20 DIAGNOSIS — E119 Type 2 diabetes mellitus without complications: Secondary | ICD-10-CM | POA: Diagnosis not present

## 2016-11-21 LAB — CUP PACEART REMOTE DEVICE CHECK
Implantable Pulse Generator Implant Date: 20160325
MDC IDC SESS DTM: 20180813004207

## 2016-11-21 NOTE — Progress Notes (Signed)
Carelink summary report received. Battery status OK. Normal device function. No new symptom episodes, tachy episodes, brady, or pause episodes. 24 AF 2.2% +Tikosyn +Xarelto Monthly summary reports and ROV/PRN

## 2016-11-23 ENCOUNTER — Encounter: Payer: Self-pay | Admitting: Physician Assistant

## 2016-11-27 ENCOUNTER — Encounter: Payer: Self-pay | Admitting: Physician Assistant

## 2016-11-27 ENCOUNTER — Ambulatory Visit (INDEPENDENT_AMBULATORY_CARE_PROVIDER_SITE_OTHER): Payer: BLUE CROSS/BLUE SHIELD | Admitting: Physician Assistant

## 2016-11-27 VITALS — BP 130/82 | HR 74 | Ht 73.0 in | Wt 280.0 lb

## 2016-11-27 DIAGNOSIS — I481 Persistent atrial fibrillation: Secondary | ICD-10-CM

## 2016-11-27 DIAGNOSIS — Z79899 Other long term (current) drug therapy: Secondary | ICD-10-CM

## 2016-11-27 DIAGNOSIS — I2510 Atherosclerotic heart disease of native coronary artery without angina pectoris: Secondary | ICD-10-CM

## 2016-11-27 DIAGNOSIS — I729 Aneurysm of unspecified site: Secondary | ICD-10-CM | POA: Diagnosis not present

## 2016-11-27 DIAGNOSIS — I4819 Other persistent atrial fibrillation: Secondary | ICD-10-CM

## 2016-11-27 MED ORDER — RIVAROXABAN 20 MG PO TABS: 20.0000 mg | ORAL_TABLET | Freq: Every day | ORAL | 3 refills | Status: DC

## 2016-11-27 NOTE — Patient Instructions (Addendum)
Medication Instructions:  Your physician recommends that you continue on your current medications as directed. Please refer to the Current Medication list given to you today.  Labwork: Today: BMET, CBC w/ diff & Magesium level  Testing/Procedures: None ordered  Follow-Up: You are scheduled to follow up with Dr. Trula Slade on Monday October 15 @ 10:00  (please call their office at [phone] to reschedule)  Your physician recommends that you schedule a follow-up appointment in: October with Dr. Bronson Ing.   Your physician wants you to follow-up in: 6 months with Roderic Palau, NP in the AFib clinic. You will receive a reminder letter in the mail two months in advance. If you don't receive a letter, please call our office to schedule the follow-up appointment.   If you need a refill on your cardiac medications before your next appointment, please call your pharmacy.  Thank you for choosing [facility]!!

## 2016-11-27 NOTE — Progress Notes (Signed)
Cardiology Office Note Date:  11/27/2016  Patient ID:  Austin Woods, Austin Woods [birth year], MRN [id number] PCP:  [name], MD  Cardiologist:  Dr. Bronson Ing Electrophysiologist: Dr. Rayann Heman    Chief Complaint: planned EP visit  History of Present Illness: Austin Woods is a 51 y.o. male with history of CAD (PCI to LAD 02/03/16), persistent AFib (ablation x2), HTN, HLD, OSA, underwent treatment of pseudoaneurysm of rt groin with thrombin injection Dec 2017 followed with Dr. Trula Slade.  He is doing well, happy about new job and now on day shift.  No CP, palpitations, no SOB.  He feels like his groin is about the same as his last visit with vascular in late April, perhaps ever so slowly improving.  No near syncope or syncope.   He has infrequently feels like he has a little irregularity to his pulse, but no overt AF that he has felt.     He was recommended to see vascular as needed at his last visit. He tells me that his groin is certainly not any worse, it does not feel significantly better either.  He reports "good days and bad days" with waxing/waning stinging type pain/aching.  He works on his feet.  He would like to exercise but thinks his groin will be a limiting factor for this.    He denies any bleeding or signs of bleeding, will bruise easily.  He has some bruising on his abdomen, he thinks at work with lifting and carrying boxes.  AFib hx AAD: flecainide, Tikosyn AF ablation Dec 2014, 02/18/16 Multiple DCCV  WPW ablation 2001  Device information: MDT ILR, implanted 2016, Dr. Rayann Heman, AFib  Past Medical History:  Diagnosis Date  . Asthma   . GERD (gastroesophageal reflux disease)   . Headache   . High cholesterol   . History of DVT (deep vein thrombosis)    a. left leg following ablation for SVT  . Hypertension   . Persistent atrial fibrillation ([place])    a. Dx 08/2012, xarelto initiated; b. 09/2012 Tikosyn initiated -> DCCV -> Sinus c. PVI 03/2013 d. PVI 03/2014  .  Sleep apnea    does not use CPAP (he reports having uvulectomy)   . Wolff-Parkinson-White (WPW) syndrome    a. s/p RFCA 2001 by Dr Caryl Comes, pt reports complicated by post procedure DVT    Past Surgical History:  Procedure Laterality Date  . ATRIAL FIBRILLATION ABLATION  03/23/2013   PVI by DR Rayann Heman for afib  . ATRIAL FIBRILLATION ABLATION N/A 03/23/2013   Procedure: ATRIAL FIBRILLATION ABLATION;  Surgeon: Coralyn Mark, MD;  Location: [place] CATH LAB;  Service: Cardiovascular;  Laterality: N/A;  . ATRIAL FIBRILLATION ABLATION N/A 03/27/2014   PVI Dr Rayann Heman  . BACK SURGERY    . CARDIAC CATHETERIZATION N/A 02/03/2016   Procedure: Left Heart Cath and Coronary Angiography;  Surgeon: Nelva Bush, MD;  Location: [place] CV LAB;  Service: Cardiovascular;  Laterality: N/A;  . CARDIAC CATHETERIZATION N/A 02/03/2016   Procedure: Intravascular Pressure Wire/FFR Study;  Surgeon: Nelva Bush, MD;  Location: [place] CV LAB;  Service: Cardiovascular;  Laterality: N/A;  . CARDIAC CATHETERIZATION N/A 02/03/2016   Procedure: Coronary Stent Intervention;  Surgeon: Nelva Bush, MD;  Location: [place] CV LAB;  Service: Cardiovascular;  Laterality: N/A;  . CARDIAC SURGERY    . CARDIOVERSION N/A 08/25/2012   Procedure: CARDIOVERSION;  Surgeon: Thayer Headings, MD;  Location: [place];  Service: Cardiovascular;  Laterality: N/A;  . CARDIOVERSION N/A  09/06/2012   Procedure: CARDIOVERSION/Bedside;  Surgeon: Carlena Bjornstad, MD;  Location: [place];  Service: Cardiovascular;  Laterality: N/A;  . CARDIOVERSION N/A 01/12/2014   Procedure: CARDIOVERSION;  Surgeon: Pixie Casino, MD;  Location: [facility] ENDOSCOPY;  Service: Cardiovascular;  Laterality: N/A;  . CARDIOVERSION N/A 01/30/2016   Procedure: CARDIOVERSION;  Surgeon: Sueanne Margarita, MD;  Location: [facility] ENDOSCOPY;  Service: Cardiovascular;  Laterality: N/A;  . CARDIOVERSION N/A 04/14/2016   Procedure: CARDIOVERSION;  Surgeon: Pixie Casino, MD;   Location: [facility] ENDOSCOPY;  Service: Cardiovascular;  Laterality: N/A;  . COLONOSCOPY N/A 12/04/2015   Procedure: COLONOSCOPY;  Surgeon: Rogene Houston, MD;  [facility];  Service: Endoscopy;  Laterality: N/A;  730  . ELECTROPHYSIOLOGIC STUDY N/A 02/18/2016   Procedure: Atrial Fibrillation Ablation;  Surgeon: Thompson Grayer, MD;  Location: [place] CV LAB;  Service: Cardiovascular;  Laterality: N/A;  . LOOP RECORDER IMPLANT N/A 06/29/2014   Procedure: LOOP RECORDER IMPLANT;  Surgeon: Thompson Grayer, MD;  Location: [facility] CATH LAB;  Service: Cardiovascular;  Laterality: N/A;  . NOSE SURGERY     sleep apnea surgery  . PERIPHERAL VASCULAR CATHETERIZATION N/A 03/13/2016   Procedure: Thrombin Injection;  Surgeon: Serafina Mitchell, MD;  Location: [place] CV LAB;  Service: Cardiovascular;  Laterality: N/A;  . STOMACH SURGERY     reflux, fundoplication  . TEE WITHOUT CARDIOVERSION N/A 08/25/2012   Procedure: TRANSESOPHAGEAL ECHOCARDIOGRAM (TEE);  Surgeon: Thayer Headings, MD;  Location: [place];  Service: Cardiovascular;  Laterality: N/A;  . TEE WITHOUT CARDIOVERSION  03/23/2013   DR ROSS  . TEE WITHOUT CARDIOVERSION N/A 03/23/2013   Procedure: TRANSESOPHAGEAL ECHOCARDIOGRAM (TEE);  Surgeon: Fay Records, MD;  Location: [facility] ENDOSCOPY;  Service: Cardiovascular;  Laterality: N/A;  . TEE WITHOUT CARDIOVERSION N/A 03/26/2014   Procedure: TRANSESOPHAGEAL ECHOCARDIOGRAM (TEE);  Surgeon: Josue Hector, MD;  Location: [facility] ENDOSCOPY;  Service: Cardiovascular;  Laterality: N/A;  . TONSILLECTOMY      Current Outpatient Prescriptions  Medication Sig Dispense Refill  . acetaminophen (TYLENOL) 500 MG tablet Take 1,000 mg by mouth every 6 (six) hours as needed for headache.    . albuterol (PROVENTIL HFA) 108 (90 Base) MCG/ACT inhaler Inhale 2 puffs into the lungs every 4 (four) hours as needed for wheezing or shortness of breath. 1 Inhaler 0  . atorvastatin (LIPITOR) 10 MG tablet Take 1 tablet (10 mg  total) by mouth daily. 30 tablet 0  . Cholecalciferol (VITAMIN D) 2000 UNITS CAPS Take 2,000 Units by mouth daily.     . clopidogrel (PLAVIX) 75 MG tablet Take 1 tablet (75 mg total) by mouth daily with breakfast. 90 tablet 3  . dofetilide (TIKOSYN) 500 MCG capsule Take 1 capsule (500 mcg total) by mouth 2 (two) times daily. 180 capsule 3  . enalapril (VASOTEC) 10 MG tablet Take 1 tablet (10 mg total) by mouth 2 (two) times daily. 180 tablet 3  . Fluticasone-Salmeterol (ADVAIR DISKUS) 250-50 MCG/DOSE AEPB Inhale 1 puff into the lungs every 12 (twelve) hours. 60 each 0  . furosemide (LASIX) 40 MG tablet TAKE 1 TABLET BY MOUTH  DAILY 7 tablet 0  . hydrocortisone (PROCTOZONE-HC) 2.5 % rectal cream Place 1 application rectally as needed (Use as directed).    . hydrOXYzine (VISTARIL) 25 MG capsule Take 2 capsules (50 mg total) by mouth 3 (three) times daily as needed for anxiety. 30 capsule 0  . ketoconazole (NIZORAL) 2 % cream Apply 1 application topically 2 (two) times  daily as needed for irritation.    Marland Kitchen MAGNESIUM PO Take 500 mg by mouth daily.     . metoprolol succinate (TOPROL-XL) 100 MG 24 hr tablet Take 1/2 tablet (50 mg) by mouth daily. Take with or immediately following a meal. 30 tablet 3  . nitroGLYCERIN (NITROSTAT) 0.4 MG SL tablet Place 1 tablet (0.4 mg total) under the tongue every 5 (five) minutes x 3 doses as needed for chest pain. 75 tablet 1  . oxybutynin (DITROPAN) 5 MG tablet TAKE ONE-HALF TABLET BY  MOUTH TWO TIMES DAILY AS  NEEDED FOR BLADDER SPASMS 30 tablet 0  . pantoprazole (PROTONIX) 40 MG tablet TAKE 1 TABLET BY MOUTH  DAILY 90 tablet 2  . potassium chloride SA (K-DUR,KLOR-CON) 20 MEQ tablet TAKE 1 TABLET BY MOUTH  EVERY DAY 30 tablet 0  . POTASSIUM PO Take 99 mg by mouth daily. OTC     . rivaroxaban (XARELTO) 20 MG TABS tablet Take 1 tablet (20 mg total) by mouth daily with supper. 90 tablet 3   No current facility-administered medications for this visit.     Allergies:    Patient has no known allergies.   Social History:  The patient  reports that he quit smoking about 6 years ago. His smoking use included Cigarettes. He started smoking about 13 years ago. He has a 7.00 pack-year smoking history. He has never used smokeless tobacco. He reports that he does not drink alcohol or use drugs.   Family History:  The patient's family history includes Anemia in his mother; Aneurysm in his maternal grandmother; CAD (age of onset: 59) in his father; Diabetes in his brother, father, mother, and paternal grandmother; Gout in his mother; Heart attack in his father; Stroke in his maternal grandfather and paternal grandfather; Transient ischemic attack in his father; Vascular Disease in his father.  ROS:  Please see the history of present illness.   All other systems are reviewed and otherwise negative.   PHYSICAL EXAM:  VS:  BP 130/82   Pulse 74   Ht 6\' 1"  (1.854 m)   Wt 280 lb (127 kg)   BMI 36.94 kg/m  BMI: Body mass index is 36.94 kg/m. Well nourished, well developed, in no acute distress  HEENT: normocephalic, atraumatic  Neck: no JVD, carotid bruits or masses Cardiac:  RRR; no significant murmurs, no rubs, or gallops Lungs:  CTA b/l, no wheezing, rhonchi or rales  Abd: soft, non-tender, obese MS: no deformity or atrophy Ext: R groin is soft, non-tender, no palpable masses, he has excellent pedal pulses. no edema  Skin: warm and dry, no rash Neuro:  No gross deficits appreciated Psych: euthymic mood, full affect  ILR site is stable, no tethering or discomfort   EKG:  Done today and reviewed by myself shows SR 74bpm, PR 173ms, QRS 155ms, QTc 444ms ILR interrogation done today and reviewed by myself: battery is good.  Clear reduction in his AF with Tikosyn, at least some look likely are not true AF but artifact  01/31/16: TTE Study Conclusions - Left ventricle: The cavity size was mildly dilated. Systolic   function was mildly reduced. The estimated ejection  fraction was   in the range of 45% to 50%. Moderate hypokinesis of the apical   myocardium. Left ventricular diastolic function parameters were   normal for the patient&'s age.  02/03/16: LHC/PCI Conclusions: 1. Mild to moderate LMCA and moderate to severe proximal/mid LAD and D1 disease. FFR of proximal/mid LAD stenosis was significant  at 0.72 with greatest pressure drop occurring across this lesion. 2. Normal left ventricular filling pressure. 3. Successful FFR guided PCI to proximal/mid LAD with 0% residual stenosis and TIMI-3 flow. Prevention to mid LAD resulted in total occlusion of the ostium of D1. TIMI-3 flow was successfully restored with 30% residual stenosis using balloon angioplasty with kissing balloon inflation.  Recommendations: 1. Dual antiplatelet therapy with aspirin and clopidogrel for at least a month, in addition to rivaroxaban or other anticoagulation. At that point, could consider discontinuation of aspirin if no ischemic symptoms or present. 2. Aggressive secondary prevention. 3. Restart heparin infusion for atrial fibrillation 2 hours after TR band has been deflated  Recent Labs: 07/10/2016: Hemoglobin 14.4; Platelets 236 08/26/2016: BUN 9; Creatinine, Ser 1.16; Magnesium 1.9; Potassium 3.9; Sodium 141  01/31/2016: Cholesterol 145; HDL 31; LDL Cholesterol 58; Total CHOL/HDL Ratio 4.7; Triglycerides 280; VLDL 56   CrCl cannot be calculated (Patient's most recent lab result is older than the maximum 21 days allowed.).   Wt Readings from Last 3 Encounters:  11/27/16 280 lb (127 kg)  08/26/16 283 lb (128.4 kg)  07/28/16 282 lb 8 oz (128.1 kg)     Other studies reviewed: Additional studies/records reviewed today include: summarized above  ASSESSMENT AND PLAN:  1. Persistent AFib     CHA2DS2Vasc is at least 2, on Xarelto, appropriately dosed at 20mg      Tiksoyn, QT is stable, labs today  2. CAD     No anginal symptoms, due to see Dr. Bronson Ing soon     On  plavix, BBstatin  3. HTN     Looks good, no changes  4. R groin pseudoaneurysm     No obvious exam findings today   Disposition: F/u with Vascular service with his ongoing waxing/waning groin discomfort.  Dr. Bronson Ing as directed by him, and he would like to check back in at the AF clinic, will plan for 6 months, sooner if needed.  Current medicines are reviewed at length with the patient today.  The patient did not have any concerns regarding medicines.  Haywood Lasso, PA-C 11/27/2016 3:13 PM     [place] [place] [id number] [phone] (office)  [phone] (fax)

## 2016-11-28 LAB — BASIC METABOLIC PANEL
BUN / CREAT RATIO: 12 (ref 9–20)
BUN: 14 mg/dL (ref 6–24)
CO2: 24 mmol/L (ref 20–29)
CREATININE: 1.19 mg/dL (ref 0.76–1.27)
Calcium: 9.4 mg/dL (ref 8.7–10.2)
Chloride: 102 mmol/L (ref 96–106)
GFR calc Af Amer: 80 mL/min/{1.73_m2} (ref >59)
GFR, EST NON AFRICAN AMERICAN: 69 mL/min/{1.73_m2} (ref >59)
Glucose: 111 mg/dL — ABNORMAL HIGH (ref 65–99)
Potassium: 3.9 mmol/L (ref 3.5–5.2)
SODIUM: 144 mmol/L (ref 134–144)

## 2016-11-28 LAB — CBC WITH DIFFERENTIAL/PLATELET
BASOS: 0 %
Basophils Absolute: 0 10*3/uL (ref 0.0–0.2)
EOS (ABSOLUTE): 0.1 10*3/uL (ref 0.0–0.4)
EOS: 1 %
Hematocrit: 44.7 % (ref 37.5–51.0)
Hemoglobin: 14.6 g/dL (ref 13.0–17.7)
Immature Grans (Abs): 0 10*3/uL (ref 0.0–0.1)
Immature Granulocytes: 0 %
LYMPHS ABS: 1.8 10*3/uL (ref 0.7–3.1)
Lymphs: 22 %
MCH: 30.5 pg (ref 26.6–33.0)
MCHC: 32.7 g/dL (ref 31.5–35.7)
MCV: 93 fL (ref 79–97)
MONOS ABS: 0.6 10*3/uL (ref 0.1–0.9)
Monocytes: 8 %
NEUTROS PCT: 69 %
Neutrophils Absolute: 5.5 10*3/uL (ref 1.4–7.0)
PLATELETS: 294 10*3/uL (ref 150–379)
RBC: 4.79 x10E6/uL (ref 4.14–5.80)
RDW: 13.7 % (ref 12.3–15.4)
WBC: 8.1 10*3/uL (ref 3.4–10.8)

## 2016-11-28 LAB — MAGNESIUM: Magnesium: 2.2 mg/dL (ref 1.6–2.3)

## 2016-11-30 NOTE — Progress Notes (Signed)
Loop Recorder Summary Report 

## 2016-12-03 ENCOUNTER — Telehealth: Payer: Self-pay | Admitting: *Deleted

## 2016-12-03 NOTE — Telephone Encounter (Signed)
-----   Message from [facility], [place] sent at 11/30/2016  7:27 AM EDT ----- Please let the patient know his labs look OK, I would like his potassium a little higher with his tikosyn, please increase his Kdur to 77meq and recheck a BMET in a week.  Thanks renee

## 2016-12-03 NOTE — Telephone Encounter (Signed)
LMOVM TO CALL BACK  

## 2016-12-04 ENCOUNTER — Other Ambulatory Visit: Payer: Self-pay

## 2016-12-04 ENCOUNTER — Telehealth: Payer: Self-pay | Admitting: Physician Assistant

## 2016-12-04 DIAGNOSIS — R1031 Right lower quadrant pain: Secondary | ICD-10-CM

## 2016-12-04 NOTE — Telephone Encounter (Signed)
Called patient to clarify his Vistaril use.  Discussed with [facility] [place] his QT is stable on drug, no need to change/stop, but to notify of [place] of dosing adjustments/changes.  Left a message with the patient to call and clarify medication.  Tommye Standard, PA-C

## 2016-12-04 NOTE — Telephone Encounter (Signed)
Patient returned my call, he is not currently taking Vistaril at all.  We discussed importance that if he should need medication for anxiety, he needs to make sure he discussed with [place] or his pharmacist that it is safe with his Tikosyn, and any new medicines.  He states understanding.  He also mentions that his K+ dose that he was on at the time of our visit was only 47meq, not 16meq.  So he will take 15meq daily and have labs next week as planned.  I will ask my MA to update his medication list.   Tommye Standard, PA-C

## 2016-12-06 ENCOUNTER — Other Ambulatory Visit: Payer: Self-pay | Admitting: Physician Assistant

## 2016-12-08 ENCOUNTER — Encounter: Payer: Self-pay | Admitting: *Deleted

## 2016-12-10 DIAGNOSIS — I481 Persistent atrial fibrillation: Secondary | ICD-10-CM | POA: Diagnosis not present

## 2016-12-10 DIAGNOSIS — I480 Paroxysmal atrial fibrillation: Secondary | ICD-10-CM | POA: Diagnosis not present

## 2016-12-10 DIAGNOSIS — E876 Hypokalemia: Secondary | ICD-10-CM | POA: Diagnosis not present

## 2016-12-11 ENCOUNTER — Other Ambulatory Visit: Payer: Self-pay | Admitting: *Deleted

## 2016-12-11 LAB — POTASSIUM: Potassium: 4.7 mmol/L (ref 3.5–5.2)

## 2016-12-15 ENCOUNTER — Ambulatory Visit (INDEPENDENT_AMBULATORY_CARE_PROVIDER_SITE_OTHER): Payer: BLUE CROSS/BLUE SHIELD | Admitting: *Deleted

## 2016-12-15 ENCOUNTER — Telehealth: Payer: Self-pay | Admitting: *Deleted

## 2016-12-15 DIAGNOSIS — I481 Persistent atrial fibrillation: Secondary | ICD-10-CM

## 2016-12-15 DIAGNOSIS — I4819 Other persistent atrial fibrillation: Secondary | ICD-10-CM

## 2016-12-15 NOTE — Telephone Encounter (Signed)
LMOVM OF NORMAL RESULTS OFFICE CONTACT NUMBER GIVEN IF ANY QUESTIONS.

## 2016-12-16 NOTE — Progress Notes (Signed)
Carelink Summary Report / Loop Recorder 

## 2016-12-17 LAB — CUP PACEART REMOTE DEVICE CHECK
MDC IDC PG IMPLANT DT: 20160325
MDC IDC SESS DTM: 20180912014207

## 2016-12-21 ENCOUNTER — Ambulatory Visit (INDEPENDENT_AMBULATORY_CARE_PROVIDER_SITE_OTHER): Payer: BLUE CROSS/BLUE SHIELD | Admitting: Surgery

## 2016-12-21 ENCOUNTER — Encounter: Payer: Self-pay | Admitting: Surgery

## 2016-12-21 ENCOUNTER — Ambulatory Visit (HOSPITAL_COMMUNITY)
Admission: RE | Admit: 2016-12-21 | Discharge: 2016-12-21 | Disposition: A | Discharge status: Home / Self Care | Payer: BLUE CROSS/BLUE SHIELD | Source: Ambulatory Visit | Attending: Surgery | Admitting: Surgery

## 2016-12-21 VITALS — BP 117/72 | HR 83 | Temp 97.8°F | Ht 73.0 in | Wt 216.0 lb

## 2016-12-21 DIAGNOSIS — R1909 Other intra-abdominal and pelvic swelling, mass and lump: Secondary | ICD-10-CM | POA: Diagnosis not present

## 2016-12-21 DIAGNOSIS — R1031 Right lower quadrant pain: Secondary | ICD-10-CM | POA: Diagnosis not present

## 2016-12-21 DIAGNOSIS — I724 Aneurysm of artery of lower extremity: Secondary | ICD-10-CM | POA: Diagnosis not present

## 2016-12-21 NOTE — Progress Notes (Signed)
[facility] [place]  Patient name: Austin Woods MRN: [id number] DOB: Sep 03, 1965 Sex: male   REASON FOR VISIT:    Follow up  HISOTRY OF PRESENT ILLNESS:    Austin Woods is a 51 y.o. male initially developed a right femoral pseudoaneurysm after an A. fib ablation and PCI.  I performed a thrombin injection.  He had a small residual pseudoaneurysm which we followed, and by u/s this Has thrombosed.  Unfortunately, the patient still complains of significant pain in his groin going down into his testicle.  This produces sharp pains.  PAST MEDICAL HISTORY:   Past Medical History:  Diagnosis Date  . Asthma   . GERD (gastroesophageal reflux disease)   . Headache   . High cholesterol   . History of DVT (deep vein thrombosis)    a. left leg following ablation for SVT  . Hypertension   . Persistent atrial fibrillation ([place])    a. Dx 08/2012, xarelto initiated; b. 09/2012 Tikosyn initiated -> DCCV -> Sinus c. PVI 03/2013 d. PVI 03/2014  . Sleep apnea    does not use CPAP (he reports having uvulectomy)   . Wolff-Parkinson-White (WPW) syndrome    a. s/p RFCA 2001 by Dr Caryl Comes, pt reports complicated by post procedure DVT     FAMILY HISTORY:   Family History  Problem Relation Age of Onset  . CAD Father 37  . Diabetes Father   . Heart attack Father   . Transient ischemic attack Father   . Vascular Disease Father   . Diabetes Mother   . Anemia Mother   . Gout Mother   . Aneurysm Maternal Grandmother   . Stroke Maternal Grandfather   . Diabetes Paternal Grandmother   . Stroke Paternal Grandfather   . Diabetes Brother     SOCIAL HISTORY:   Social History  Substance Use Topics  . Smoking status: Former Smoker    Packs/day: 1.00    Years: 7.00    Types: Cigarettes    Start date: 05/19/2003    Quit date: 08/06/2010  . Smokeless tobacco: Never Used  . Alcohol use No     ALLERGIES:   No Known  Allergies   CURRENT MEDICATIONS:   Current Outpatient Prescriptions  Medication Sig Dispense Refill  . acetaminophen (TYLENOL) 500 MG tablet Take 1,000 mg by mouth every 6 (six) hours as needed for headache.    . albuterol (PROVENTIL HFA) 108 (90 Base) MCG/ACT inhaler Inhale 2 puffs into the lungs every 4 (four) hours as needed for wheezing or shortness of breath. 1 Inhaler 0  . atorvastatin (LIPITOR) 10 MG tablet Take 1 tablet (10 mg total) by mouth daily. 30 tablet 0  . Cholecalciferol (VITAMIN D) 2000 UNITS CAPS Take 2,000 Units by mouth daily.     . clopidogrel (PLAVIX) 75 MG tablet Take 1 tablet (75 mg total) by mouth daily with breakfast. 90 tablet 3  . clopidogrel (PLAVIX) 75 MG tablet TAKE 1 TABLET BY MOUTH  DAILY WITH BREAKFAST 30 tablet 5  . dofetilide (TIKOSYN) 500 MCG capsule Take 1 capsule (500 mcg total) by mouth 2 (two) times daily. 180 capsule 3  . enalapril (VASOTEC) 10 MG tablet Take 1 tablet (10 mg total) by mouth 2 (two) times daily. 180 tablet 3  . Fluticasone-Salmeterol (ADVAIR DISKUS) 250-50 MCG/DOSE AEPB Inhale 1 puff into the lungs every 12 (twelve) hours. 60 each 0  . furosemide (LASIX) 40 MG tablet TAKE 1 TABLET BY MOUTH  DAILY 7 tablet 0  . hydrocortisone (PROCTOZONE-HC) 2.5 % rectal cream Place 1 application rectally as needed (Use as directed).    . hydrOXYzine (VISTARIL) 25 MG capsule Take 2 capsules (50 mg total) by mouth 3 (three) times daily as needed for anxiety. 30 capsule 0  . ketoconazole (NIZORAL) 2 % cream Apply 1 application topically 2 (two) times daily as needed for irritation.    Marland Kitchen MAGNESIUM PO Take 500 mg by mouth daily.     . metoprolol succinate (TOPROL-XL) 100 MG 24 hr tablet Take 1/2 tablet (50 mg) by mouth daily. Take with or immediately following a meal. 30 tablet 3  . nitroGLYCERIN (NITROSTAT) 0.4 MG SL tablet Place 1 tablet (0.4 mg total) under the tongue every 5 (five) minutes x 3 doses as needed for chest pain. 75 tablet 1  . oxybutynin  (DITROPAN) 5 MG tablet TAKE ONE-HALF TABLET BY  MOUTH TWO TIMES DAILY AS  NEEDED FOR BLADDER SPASMS 30 tablet 0  . pantoprazole (PROTONIX) 40 MG tablet TAKE 1 TABLET BY MOUTH  DAILY 90 tablet 2  . potassium chloride SA (K-DUR,KLOR-CON) 20 MEQ tablet TAKE 1 TABLET BY MOUTH  EVERY DAY 30 tablet 0  . POTASSIUM PO Take 99 mg by mouth daily. OTC     . rivaroxaban (XARELTO) 20 MG TABS tablet Take 1 tablet (20 mg total) by mouth daily with supper. 90 tablet 3   No current facility-administered medications for this visit.     REVIEW OF SYSTEMS:   [X]  denotes positive finding, [ ]  denotes negative finding Cardiac  Comments:  Chest pain or chest pressure:    Shortness of breath upon exertion:    Short of breath when lying flat:    Irregular heart rhythm:        Vascular    Pain in calf, thigh, or hip brought on by ambulation: x   Pain in feet at night that wakes you up from your sleep:     Blood clot in your veins:    Leg swelling:         Pulmonary    Oxygen at home:    Productive cough:     Wheezing:         Neurologic    Sudden weakness in arms or legs:     Sudden numbness in arms or legs:     Sudden onset of difficulty speaking or slurred speech:    Temporary loss of vision in one eye:     Problems with dizziness:         Gastrointestinal    Blood in stool:     Vomited blood:         Genitourinary    Burning when urinating:     Blood in urine:        Psychiatric    Major depression:         Hematologic    Bleeding problems:    Problems with blood clotting too easily:        Skin    Rashes or ulcers:        Constitutional    Fever or chills:      PHYSICAL EXAM:   Vitals:   12/21/16 1514  BP: 117/72  Pulse: 83  Temp: 97.8 F (36.6 C)  SpO2: 94%  Weight: 216 lb (98 kg)  Height: 6\' 1"  (1.854 m)    GENERAL: The patient is a well-nourished male, in no acute distress. The vital signs are documented  above. CARDIAC: There is a regular rate and rhythm.   VASCULAR: Bulge in the right groin is very tender.  It is not pulsatile PULMONARY: Non-labored respirations MUSCULOSKELETAL: There are no major deformities or cyanosis. NEUROLOGIC: No focal weakness or paresthesias are detected. SKIN: There are no ulcers or rashes noted. PSYCHIATRIC: The patient has a normal affect.  STUDIES:   Ultrasound today shows the complex mass measuring 4.0 x 2.1 cm.  Previously it had measured 5.3 x 2.2.  There is no flow in this area  MEDICAL ISSUES:   Ultrasound continues to show a decrease in the size of the area of concern.  There is no flow within this structure to suggest pseudoaneurysm.  Unfortunately, the patient continues to have significant pain.  At this point, I have elected to order a CT angiogram to better evaluate this area.  Once this is completed he will follow with me.  We are trying to avoid any Surgical intervention for at least 1 year following PCI.    Annamarie Major, MD [facility] [phone] Pager [phone]

## 2016-12-22 ENCOUNTER — Telehealth: Payer: Self-pay | Admitting: Surgery

## 2016-12-22 DIAGNOSIS — E669 Obesity, unspecified: Secondary | ICD-10-CM | POA: Diagnosis not present

## 2016-12-22 DIAGNOSIS — Z139 Encounter for screening, unspecified: Secondary | ICD-10-CM | POA: Diagnosis not present

## 2016-12-22 DIAGNOSIS — Z79899 Other long term (current) drug therapy: Secondary | ICD-10-CM | POA: Diagnosis not present

## 2016-12-22 DIAGNOSIS — E785 Hyperlipidemia, unspecified: Secondary | ICD-10-CM | POA: Diagnosis not present

## 2016-12-22 DIAGNOSIS — R7301 Impaired fasting glucose: Secondary | ICD-10-CM | POA: Diagnosis not present

## 2016-12-22 DIAGNOSIS — E559 Vitamin D deficiency, unspecified: Secondary | ICD-10-CM | POA: Diagnosis not present

## 2016-12-22 NOTE — Addendum Note (Signed)
Addended by: Lianne Cure A on: 12/22/2016 10:42 AM   Modules accepted: Orders

## 2016-12-22 NOTE — Telephone Encounter (Signed)
Left voice message for patient regarding his scheduled appointment for a CTA on 9/27/18at 5pm at [facility] Radiology Dept. The patient is to arrive at 4:45pm and no solid foods 4 hours prior. Liquids and medications are okay. He is also scheduled to see Dr.Brabham here at [facility] on 01/20/17 at 3:45pm. I mailed a letter with the above information as well. awt

## 2016-12-31 ENCOUNTER — Ambulatory Visit (HOSPITAL_COMMUNITY): Payer: BLUE CROSS/BLUE SHIELD

## 2016-12-31 DIAGNOSIS — E785 Hyperlipidemia, unspecified: Secondary | ICD-10-CM | POA: Diagnosis not present

## 2016-12-31 DIAGNOSIS — E559 Vitamin D deficiency, unspecified: Secondary | ICD-10-CM | POA: Diagnosis not present

## 2016-12-31 DIAGNOSIS — R7301 Impaired fasting glucose: Secondary | ICD-10-CM | POA: Diagnosis not present

## 2017-01-01 ENCOUNTER — Telehealth: Payer: Self-pay | Admitting: Family Medicine

## 2017-01-01 NOTE — Telephone Encounter (Signed)
Review lab results in results folder. °

## 2017-01-02 ENCOUNTER — Other Ambulatory Visit: Payer: Self-pay | Admitting: Internal Medicine

## 2017-01-07 ENCOUNTER — Ambulatory Visit (HOSPITAL_COMMUNITY)
Admission: RE | Admit: 2017-01-07 | Discharge: 2017-01-07 | Disposition: A | Discharge status: Home / Self Care | Payer: BLUE CROSS/BLUE SHIELD | Source: Ambulatory Visit | Attending: Surgery | Admitting: Surgery

## 2017-01-07 DIAGNOSIS — I700 Atherosclerosis of aorta: Secondary | ICD-10-CM | POA: Insufficient documentation

## 2017-01-07 DIAGNOSIS — M5136 Other intervertebral disc degeneration, lumbar region: Secondary | ICD-10-CM | POA: Insufficient documentation

## 2017-01-07 DIAGNOSIS — I724 Aneurysm of artery of lower extremity: Secondary | ICD-10-CM | POA: Insufficient documentation

## 2017-01-07 DIAGNOSIS — K4090 Unilateral inguinal hernia, without obstruction or gangrene, not specified as recurrent: Secondary | ICD-10-CM | POA: Insufficient documentation

## 2017-01-07 DIAGNOSIS — I2510 Atherosclerotic heart disease of native coronary artery without angina pectoris: Secondary | ICD-10-CM | POA: Insufficient documentation

## 2017-01-07 MED ORDER — IOPAMIDOL (ISOVUE-370) INJECTION 76%
100.0000 mL | Freq: Once | INTRAVENOUS | Status: AC | PRN
  Administered 2017-01-07: 100 mL via INTRAVENOUS

## 2017-01-10 ENCOUNTER — Other Ambulatory Visit: Payer: Self-pay | Admitting: Physician Assistant

## 2017-01-14 ENCOUNTER — Ambulatory Visit (INDEPENDENT_AMBULATORY_CARE_PROVIDER_SITE_OTHER): Payer: BLUE CROSS/BLUE SHIELD | Admitting: *Deleted

## 2017-01-14 DIAGNOSIS — I480 Paroxysmal atrial fibrillation: Secondary | ICD-10-CM

## 2017-01-15 NOTE — Progress Notes (Signed)
Carelink Summary Report / Loop Recorder 

## 2017-01-18 ENCOUNTER — Ambulatory Visit: Payer: Self-pay | Admitting: Surgery

## 2017-01-18 ENCOUNTER — Encounter (HOSPITAL_COMMUNITY): Payer: Self-pay

## 2017-01-19 LAB — CUP PACEART REMOTE DEVICE CHECK
Implantable Pulse Generator Implant Date: 20160325
MDC IDC SESS DTM: 20181012020958

## 2017-01-20 ENCOUNTER — Ambulatory Visit (INDEPENDENT_AMBULATORY_CARE_PROVIDER_SITE_OTHER): Payer: BLUE CROSS/BLUE SHIELD | Admitting: Surgery

## 2017-01-20 VITALS — BP 128/78 | HR 63 | Temp 97.1°F | Resp 16 | Ht 73.0 in | Wt 273.0 lb

## 2017-01-20 DIAGNOSIS — I724 Aneurysm of artery of lower extremity: Secondary | ICD-10-CM | POA: Diagnosis not present

## 2017-01-20 NOTE — Progress Notes (Signed)
[facility] [place]  Patient name: Austin Woods MRN: [id number] DOB: 1965-06-04 Sex: male   REASON FOR VISIT:    Follow up  HISOTRY OF PRESENT ILLNESS:   Austin Macmillan Sheltonis a 51 y.o.maleinitially developed a right femoral pseudoaneurysm after an A. fib ablation and PCI. I performed a thrombin injection. He had a small residual pseudoaneurysm which we followed, and by u/s this Has thrombosed.  Unfortunately, the patient still complains of significant pain in his groin going down into his testicle.  This produces sharp pains.  He comes back today for review of his CT scan   PAST MEDICAL HISTORY:   Past Medical History:  Diagnosis Date  . Asthma   . GERD (gastroesophageal reflux disease)   . Headache   . High cholesterol   . History of DVT (deep vein thrombosis)    a. left leg following ablation for SVT  . Hypertension   . Persistent atrial fibrillation ([place])    a. Dx 08/2012, xarelto initiated; b. 09/2012 Tikosyn initiated -> DCCV -> Sinus c. PVI 03/2013 d. PVI 03/2014  . Sleep apnea    does not use CPAP (he reports having uvulectomy)   . Wolff-Parkinson-White (WPW) syndrome    a. s/p RFCA 2001 by Dr Caryl Comes, pt reports complicated by post procedure DVT     FAMILY HISTORY:   Family History  Problem Relation Age of Onset  . CAD Father 61  . Diabetes Father   . Heart attack Father   . Transient ischemic attack Father   . Vascular Disease Father   . Diabetes Mother   . Anemia Mother   . Gout Mother   . Aneurysm Maternal Grandmother   . Stroke Maternal Grandfather   . Diabetes Paternal Grandmother   . Stroke Paternal Grandfather   . Diabetes Brother     SOCIAL HISTORY:   Social History  Substance Use Topics  . Smoking status: Former Smoker    Packs/day: 1.00    Years: 7.00    Types: Cigarettes    Start date: 05/19/2003    Quit date: 08/06/2010  . Smokeless tobacco: Never Used  . Alcohol use No      ALLERGIES:   No Known Allergies   CURRENT MEDICATIONS:   Current Outpatient Prescriptions  Medication Sig Dispense Refill  . acetaminophen (TYLENOL) 500 MG tablet Take 1,000 mg by mouth every 6 (six) hours as needed for headache.    . albuterol (PROVENTIL HFA) 108 (90 Base) MCG/ACT inhaler Inhale 2 puffs into the lungs every 4 (four) hours as needed for wheezing or shortness of breath. 1 Inhaler 0  . atorvastatin (LIPITOR) 10 MG tablet Take 1 tablet (10 mg total) by mouth daily. 30 tablet 0  . Cholecalciferol (VITAMIN D) 2000 UNITS CAPS Take 2,000 Units by mouth daily.     . clopidogrel (PLAVIX) 75 MG tablet Take 1 tablet (75 mg total) by mouth daily with breakfast. 90 tablet 3  . clopidogrel (PLAVIX) 75 MG tablet TAKE 1 TABLET BY MOUTH  DAILY WITH BREAKFAST 30 tablet 5  . dofetilide (TIKOSYN) 500 MCG capsule Take 1 capsule (500 mcg total) by mouth 2 (two) times daily. 180 capsule 3  . enalapril (VASOTEC) 10 MG tablet Take 1 tablet (10 mg total) by mouth 2 (two) times daily. 180 tablet 3  . Fluticasone-Salmeterol (ADVAIR DISKUS) 250-50 MCG/DOSE AEPB Inhale 1 puff into the lungs every 12 (twelve) hours. 60 each 0  . furosemide (LASIX) 40 MG tablet  TAKE 1 TABLET BY MOUTH  DAILY 7 tablet 0  . hydrocortisone (PROCTOZONE-HC) 2.5 % rectal cream Place 1 application rectally as needed (Use as directed).    . hydrOXYzine (VISTARIL) 25 MG capsule Take 2 capsules (50 mg total) by mouth 3 (three) times daily as needed for anxiety. 30 capsule 0  . ketoconazole (NIZORAL) 2 % cream Apply 1 application topically 2 (two) times daily as needed for irritation.    Marland Kitchen MAGNESIUM PO Take 500 mg by mouth daily.     . metoprolol succinate (TOPROL-XL) 100 MG 24 hr tablet Take 1/2 tablet (50 mg) by mouth daily. Take with or immediately following a meal. 30 tablet 3  . nitroGLYCERIN (NITROSTAT) 0.4 MG SL tablet Place 1 tablet (0.4 mg total) under the tongue every 5 (five) minutes x 3 doses as needed for chest  pain. 75 tablet 1  . nitroGLYCERIN (NITROSTAT) 0.4 MG SL tablet DISSOLVE 1 TABLET ON THE  TONGUE EVERY 5 MINUTES UP  TO 3 DOSES AS NEEDED FOR  CHEST PAIN 25 tablet 6  . oxybutynin (DITROPAN) 5 MG tablet TAKE ONE-HALF TABLET BY  MOUTH TWO TIMES DAILY AS  NEEDED FOR BLADDER SPASMS 30 tablet 0  . pantoprazole (PROTONIX) 40 MG tablet TAKE 1 TABLET BY MOUTH  DAILY 90 tablet 2  . potassium chloride SA (K-DUR,KLOR-CON) 20 MEQ tablet TAKE 1 TABLET BY MOUTH  EVERY DAY 30 tablet 0  . POTASSIUM PO Take 99 mg by mouth daily. OTC     . XARELTO 20 MG TABS tablet TAKE 1 TABLET BY MOUTH  DAILY WITH SUPPER 90 tablet 3   No current facility-administered medications for this visit.     REVIEW OF SYSTEMS:   [X]  denotes positive finding, [ ]  denotes negative finding Cardiac  Comments:  Chest pain or chest pressure:    Shortness of breath upon exertion:    Short of breath when lying flat:    Irregular heart rhythm:        Vascular    Pain in calf, thigh, or hip brought on by ambulation:    Pain in feet at night that wakes you up from your sleep:     Blood clot in your veins:    Leg swelling:         Pulmonary    Oxygen at home:    Productive cough:     Wheezing:         Neurologic    Sudden weakness in arms or legs:     Sudden numbness in arms or legs:     Sudden onset of difficulty speaking or slurred speech:    Temporary loss of vision in one eye:     Problems with dizziness:         Gastrointestinal    Blood in stool:     Vomited blood:         Genitourinary    Burning when urinating:     Blood in urine:        Psychiatric    Major depression:         Hematologic    Bleeding problems:    Problems with blood clotting too easily:        Skin    Rashes or ulcers:        Constitutional    Fever or chills:      PHYSICAL EXAM:   Vitals:   01/20/17 1559  BP: 128/78  Pulse: 63  Resp: 16  Temp: (!)  97.1 F (36.2 C)  TempSrc: Oral  SpO2: 98%  Weight: 273 lb (123.8 kg)   Height: 6\' 1"  (1.854 m)    GENERAL: The patient is a well-nourished male, in no acute distress. The vital signs are documented above. CARDIAC: There is a regular rate and rhythm.  PULMONARY: Non-labored respirations  MUSCULOSKELETAL: There are no major deformities or cyanosis. NEUROLOGIC: No focal weakness or paresthesias are detected. SKIN: There are no ulcers or rashes noted. PSYCHIATRIC: The patient has a normal affect.  STUDIES:   I have reviewed his CT scan with the following findings: 1. No evidence of femoral artery pseudoaneurysm. 2. Mild atherosclerotic vascular calcifications without focal stenosis. 3.  Aortic Atherosclerosis (ICD10-170.0) 4. Coronary artery calcifications. Please note that although the presence of coronary artery calcium documents the presence of coronary artery disease, the severity of this disease and any potential stenosis cannot be assessed on this non-gated CT examination. Assessment for potential risk factor modification, dietary therapy or pharmacologic therapy may be warranted, if clinically indicated. NON-VASCULAR  1. Moderate right and small left fat containing inguinal hernias. 2. Suspect hepatic steatosis. 3. Large volume intra-abdominal adipose tissue. 4. L4-L5 degenerative disc disease.   MEDICAL ISSUES:   The patient's femoral pseudoaneurysm has completely resolved.  There is a fat containing inguinal hernia.  This potentially could be the source of his problem.  For that reason I'm going to refer him to general surgery for further evaluation.  The patient will follow with me on an as-needed basis.    Annamarie Major, MD [facility] [phone] Pager [phone]

## 2017-01-31 ENCOUNTER — Other Ambulatory Visit: Payer: Self-pay | Admitting: Cardiovascular Disease

## 2017-02-05 ENCOUNTER — Telehealth: Payer: Self-pay | Admitting: Cardiovascular Disease

## 2017-02-07 ENCOUNTER — Other Ambulatory Visit: Payer: Self-pay | Admitting: Family Medicine

## 2017-02-09 DIAGNOSIS — Z713 Dietary counseling and surveillance: Secondary | ICD-10-CM | POA: Diagnosis not present

## 2017-02-09 DIAGNOSIS — Z6836 Body mass index (BMI) 36.0-36.9, adult: Secondary | ICD-10-CM | POA: Diagnosis not present

## 2017-02-09 DIAGNOSIS — E669 Obesity, unspecified: Secondary | ICD-10-CM | POA: Diagnosis not present

## 2017-02-09 DIAGNOSIS — E876 Hypokalemia: Secondary | ICD-10-CM | POA: Diagnosis not present

## 2017-02-10 ENCOUNTER — Other Ambulatory Visit: Payer: Self-pay | Admitting: Family Medicine

## 2017-02-11 NOTE — Telephone Encounter (Signed)
Not managing pt right now obviously , rec appt if he'd like [place] to participate

## 2017-02-11 NOTE — Telephone Encounter (Signed)
Last seen May 2017

## 2017-02-15 ENCOUNTER — Ambulatory Visit (INDEPENDENT_AMBULATORY_CARE_PROVIDER_SITE_OTHER): Payer: BLUE CROSS/BLUE SHIELD | Admitting: *Deleted

## 2017-02-15 DIAGNOSIS — I480 Paroxysmal atrial fibrillation: Secondary | ICD-10-CM | POA: Diagnosis not present

## 2017-02-15 NOTE — Progress Notes (Signed)
Carelink Summary Report / Loop Recorder 

## 2017-02-18 ENCOUNTER — Ambulatory Visit (INDEPENDENT_AMBULATORY_CARE_PROVIDER_SITE_OTHER): Payer: BLUE CROSS/BLUE SHIELD | Admitting: Cardiovascular Disease

## 2017-02-18 ENCOUNTER — Encounter: Payer: Self-pay | Admitting: Cardiovascular Disease

## 2017-02-18 VITALS — BP 118/70 | HR 71 | Ht 73.0 in | Wt 273.0 lb

## 2017-02-18 DIAGNOSIS — I10 Essential (primary) hypertension: Secondary | ICD-10-CM

## 2017-02-18 DIAGNOSIS — I25118 Atherosclerotic heart disease of native coronary artery with other forms of angina pectoris: Secondary | ICD-10-CM | POA: Diagnosis not present

## 2017-02-18 DIAGNOSIS — I4891 Unspecified atrial fibrillation: Secondary | ICD-10-CM

## 2017-02-18 DIAGNOSIS — I5022 Chronic systolic (congestive) heart failure: Secondary | ICD-10-CM | POA: Diagnosis not present

## 2017-02-18 DIAGNOSIS — E785 Hyperlipidemia, unspecified: Secondary | ICD-10-CM

## 2017-02-18 NOTE — Progress Notes (Signed)
SUBJECTIVE: The patient presents for follow-up of atrial fibrillation and coronary artery disease.  He underwent repeat a fib ablation 02/18/16.  He also has CAD and underwent PCI to prox/mid LAD on 02/03/16.  Echo 01/31/16: Mildly reduce LV systolic function, EF 17-40%, with apical hypokinesis.  He underwent loading with dofetilide again in January 2018.  He has not had any anginal symptoms. He seldom has palpitations. If he doesn't take Lasix, he has some mild leg swelling.  He has moderate right and small left fat-containing inguinal hernias.  Mg 2.2 on 11/27/16.  K 4.7 on 12/10/16.   Review of Systems: As per "subjective", otherwise negative.  No Known Allergies  Current Outpatient Medications  Medication Sig Dispense Refill  . acetaminophen (TYLENOL) 500 MG tablet Take 1,000 mg by mouth every 6 (six) hours as needed for headache.    . albuterol (PROVENTIL HFA) 108 (90 Base) MCG/ACT inhaler Inhale 2 puffs into the lungs every 4 (four) hours as needed for wheezing or shortness of breath. 1 Inhaler 0  . atorvastatin (LIPITOR) 10 MG tablet Take 1 tablet (10 mg total) by mouth daily. 30 tablet 0  . Cholecalciferol (VITAMIN D) 2000 UNITS CAPS Take 2,000 Units by mouth daily.     . clopidogrel (PLAVIX) 75 MG tablet Take 1 tablet (75 mg total) by mouth daily with breakfast. 90 tablet 3  . clopidogrel (PLAVIX) 75 MG tablet TAKE 1 TABLET BY MOUTH  DAILY WITH BREAKFAST 30 tablet 5  . dofetilide (TIKOSYN) 500 MCG capsule Take 1 capsule (500 mcg total) by mouth 2 (two) times daily. 180 capsule 3  . enalapril (VASOTEC) 10 MG tablet Take 1 tablet (10 mg total) by mouth 2 (two) times daily. 180 tablet 3  . Fluticasone-Salmeterol (ADVAIR DISKUS) 250-50 MCG/DOSE AEPB Inhale 1 puff into the lungs every 12 (twelve) hours. 60 each 0  . furosemide (LASIX) 40 MG tablet TAKE 1 TABLET BY MOUTH  DAILY 7 tablet 0  . hydrocortisone (PROCTOZONE-HC) 2.5 % rectal cream Place 1 application rectally  as needed (Use as directed).    . hydrOXYzine (VISTARIL) 25 MG capsule Take 2 capsules (50 mg total) by mouth 3 (three) times daily as needed for anxiety. 30 capsule 0  . ketoconazole (NIZORAL) 2 % cream Apply 1 application topically 2 (two) times daily as needed for irritation.    Marland Kitchen MAGNESIUM PO Take 500 mg by mouth daily.     . metoprolol succinate (TOPROL-XL) 100 MG 24 hr tablet Take 1/2 tablet (50 mg) by mouth daily. Take with or immediately following a meal. 30 tablet 3  . metoprolol succinate (TOPROL-XL) 100 MG 24 hr tablet Take 50 mg by mouth daily 45 tablet 3  . nitroGLYCERIN (NITROSTAT) 0.4 MG SL tablet Place 1 tablet (0.4 mg total) under the tongue every 5 (five) minutes x 3 doses as needed for chest pain. 75 tablet 1  . nitroGLYCERIN (NITROSTAT) 0.4 MG SL tablet DISSOLVE 1 TABLET ON THE  TONGUE EVERY 5 MINUTES UP  TO 3 DOSES AS NEEDED FOR  CHEST PAIN 25 tablet 6  . oxybutynin (DITROPAN) 5 MG tablet TAKE ONE-HALF TABLET BY  MOUTH TWO TIMES DAILY AS  NEEDED FOR BLADDER SPASMS 30 tablet 0  . pantoprazole (PROTONIX) 40 MG tablet TAKE 1 TABLET BY MOUTH  DAILY 90 tablet 2  . potassium chloride SA (K-DUR,KLOR-CON) 20 MEQ tablet TAKE 1 TABLET BY MOUTH  EVERY DAY 30 tablet 0  . POTASSIUM PO Take 99 mg by  mouth daily. OTC     . XARELTO 20 MG TABS tablet TAKE 1 TABLET BY MOUTH  DAILY WITH SUPPER 90 tablet 3   No current facility-administered medications for this visit.     Past Medical History:  Diagnosis Date  . Asthma   . GERD (gastroesophageal reflux disease)   . Headache   . High cholesterol   . History of DVT (deep vein thrombosis)    a. left leg following ablation for SVT  . Hypertension   . Persistent atrial fibrillation ([place])    a. Dx 08/2012, xarelto initiated; b. 09/2012 Tikosyn initiated -> DCCV -> Sinus c. PVI 03/2013 d. PVI 03/2014  . Sleep apnea    does not use CPAP (he reports having uvulectomy)   . Wolff-Parkinson-White (WPW) syndrome    a. s/p RFCA 2001 by Dr Caryl Comes, pt  reports complicated by post procedure DVT    Past Surgical History:  Procedure Laterality Date  . ATRIAL FIBRILLATION ABLATION  03/23/2013   PVI by DR Rayann Heman for afib  . ATRIAL FIBRILLATION ABLATION N/A 03/23/2013   Procedure: ATRIAL FIBRILLATION ABLATION;  Surgeon: Coralyn Mark, MD;  Location: [place] CATH LAB;  Service: Cardiovascular;  Laterality: N/A;  . ATRIAL FIBRILLATION ABLATION N/A 03/27/2014   PVI Dr Rayann Heman  . BACK SURGERY    . CARDIAC CATHETERIZATION N/A 02/03/2016   Procedure: Left Heart Cath and Coronary Angiography;  Surgeon: Nelva Bush, MD;  Location: [place] CV LAB;  Service: Cardiovascular;  Laterality: N/A;  . CARDIAC CATHETERIZATION N/A 02/03/2016   Procedure: Intravascular Pressure Wire/FFR Study;  Surgeon: Nelva Bush, MD;  Location: [place] CV LAB;  Service: Cardiovascular;  Laterality: N/A;  . CARDIAC CATHETERIZATION N/A 02/03/2016   Procedure: Coronary Stent Intervention;  Surgeon: Nelva Bush, MD;  Location: [place] CV LAB;  Service: Cardiovascular;  Laterality: N/A;  . CARDIAC SURGERY    . CARDIOVERSION N/A 08/25/2012   Procedure: CARDIOVERSION;  Surgeon: Thayer Headings, MD;  Location: [facility] ENDOSCOPY;  Service: Cardiovascular;  Laterality: N/A;  . CARDIOVERSION N/A 09/06/2012   Procedure: CARDIOVERSION/Bedside;  Surgeon: Carlena Bjornstad, MD;  Location: [place];  Service: Cardiovascular;  Laterality: N/A;  . CARDIOVERSION N/A 01/12/2014   Procedure: CARDIOVERSION;  Surgeon: Pixie Casino, MD;  Location: [facility] ENDOSCOPY;  Service: Cardiovascular;  Laterality: N/A;  . CARDIOVERSION N/A 01/30/2016   Procedure: CARDIOVERSION;  Surgeon: Sueanne Margarita, MD;  Location: [facility] ENDOSCOPY;  Service: Cardiovascular;  Laterality: N/A;  . CARDIOVERSION N/A 04/14/2016   Procedure: CARDIOVERSION;  Surgeon: Pixie Casino, MD;  Location: [facility] ENDOSCOPY;  Service: Cardiovascular;  Laterality: N/A;  . COLONOSCOPY N/A 12/04/2015   Procedure: COLONOSCOPY;  Surgeon: Rogene Houston, MD;  [facility] ENDO [facility];  Service: Endoscopy;  Laterality: N/A;  730  . ELECTROPHYSIOLOGIC STUDY N/A 02/18/2016   Procedure: Atrial Fibrillation Ablation;  Surgeon: Thompson Grayer, MD;  Location: [place] CV LAB;  Service: Cardiovascular;  Laterality: N/A;  . LOOP RECORDER IMPLANT N/A 06/29/2014   Procedure: LOOP RECORDER IMPLANT;  Surgeon: Thompson Grayer, MD;  Location: [facility] CATH LAB;  Service: Cardiovascular;  Laterality: N/A;  . NOSE SURGERY     sleep apnea surgery  . PERIPHERAL VASCULAR CATHETERIZATION N/A 03/13/2016   Procedure: Thrombin Injection;  Surgeon: Serafina Mitchell, MD;  Location: [place] CV LAB;  Service: Cardiovascular;  Laterality: N/A;  . STOMACH SURGERY     reflux, fundoplication  . TEE WITHOUT CARDIOVERSION N/A 08/25/2012   Procedure: TRANSESOPHAGEAL ECHOCARDIOGRAM (TEE);  Surgeon: Thayer Headings, MD;  Location: [place];  Service: Cardiovascular;  Laterality: N/A;  . TEE WITHOUT CARDIOVERSION  03/23/2013   DR ROSS  . TEE WITHOUT CARDIOVERSION N/A 03/23/2013   Procedure: TRANSESOPHAGEAL ECHOCARDIOGRAM (TEE);  Surgeon: Fay Records, MD;  Location: [facility] ENDOSCOPY;  Service: Cardiovascular;  Laterality: N/A;  . TEE WITHOUT CARDIOVERSION N/A 03/26/2014   Procedure: TRANSESOPHAGEAL ECHOCARDIOGRAM (TEE);  Surgeon: Josue Hector, MD;  Location: [facility] ENDOSCOPY;  Service: Cardiovascular;  Laterality: N/A;  . TONSILLECTOMY      Social History   Socioeconomic History  . Marital status: Married    Spouse name: Not on file  . Number of children: 2  . Years of education: Not on file  . Highest education level: Not on file  Social Needs  . Financial resource strain: Not on file  . Food insecurity - worry: Not on file  . Food insecurity - inability: Not on file  . Transportation needs - medical: Not on file  . Transportation needs - non-medical: Not on file  Occupational History    Employer: [employer]  Tobacco Use  . Smoking status: Former Smoker    Packs/day:  1.00    Years: 7.00    Pack years: 7.00    Types: Cigarettes    Start date: 05/19/2003    Last attempt to quit: 08/06/2010    Years since quitting: 6.5  . Smokeless tobacco: Never Used  Substance and Sexual Activity  . Alcohol use: No    Alcohol/week: 0.0 oz  . Drug use: No  . Sexual activity: Not on file  Other Topics Concern  . Not on file  Social History Narrative   Pt lives in [place] with wife.  Works at [employer]:   02/18/17 1338  BP: 118/70  Pulse: 71  SpO2: 97%  Weight: 273 lb (123.8 kg)  Height: 6\' 1"  (1.854 m)    Wt Readings from Last 3 Encounters:  02/18/17 273 lb (123.8 kg)  01/20/17 273 lb (123.8 kg)  12/21/16 216 lb (98 kg)     PHYSICAL EXAM General: NAD HEENT: Normal. Neck: No JVD, no thyromegaly. Lungs: Clear to auscultation bilaterally with normal respiratory effort. CV: Regular rate and rhythm, normal S1/S2, no S3/S4, no murmur. No pretibial or periankle edema.  No carotid bruit.   Abdomen: Protuberant.  Neurologic: Alert and oriented.  Psych: Normal affect. Skin: Normal. Musculoskeletal: No gross deformities.    ECG: Most recent ECG reviewed.   Labs: Lab Results  Component Value Date/Time   K 4.7 12/10/2016 04:51 PM   BUN 14 11/27/2016 04:02 PM   CREATININE 1.19 11/27/2016 04:02 PM   CREATININE 1.00 02/25/2015 01:06 PM   ALT 21 08/19/2015 10:15 AM   TSH 1.05 12/08/2012 04:48 PM   HGB 14.6 11/27/2016 04:02 PM     Lipids: Lab Results  Component Value Date/Time   LDLCALC 58 01/31/2016 03:08 AM   LDLCALC 88 08/19/2015 10:15 AM   CHOL 145 01/31/2016 03:08 AM   CHOL 156 08/19/2015 10:15 AM   TRIG 280 (H) 01/31/2016 03:08 AM   HDL 31 (L) 01/31/2016 03:08 AM   HDL 44 08/19/2015 10:15 AM       ASSESSMENT AND PLAN:  1. Atrial fibrillation s/p ablation with recurrence and palpitations: Symptomatically stable. Remains in a regular rhythm. Continue dofetilide and Toprol-XL. Remains on Xarelto for anticoagulation. Mg 2.2 on  11/27/16. K 4.7 on 12/10/16.  2. Cardiomyopathy/chronic systolic heart failure, EF  45-50%: Euvolemic on Lasix 40 mg daily. Continue Toprol-XL and enalapril.  3. HTN: Controlled. No changes.  4. Hyperlipidemia: Continue Lipitor.  5. CAD with LAD stent: Symptomatically stable.  As it is been over a year since PCI, will stop Plavix and start aspirin 81 mg.  Continue Lipitor and metoprolol.     Disposition: Follow up 6 months.   Kate Sable, M.D., F.A.C.C.

## 2017-02-18 NOTE — Patient Instructions (Signed)
Your physician wants you to follow-up in:  6 months with Dr.Koneswaran You will receive a reminder letter in the mail two months in advance. If you don't receive a letter, please call our office to schedule the follow-up appointment.     STOP Plavix    START Aspirin 81 mg daily     No lab work or testing ordered today.       Thank you for choosing [facility] !

## 2017-02-22 LAB — CUP PACEART REMOTE DEVICE CHECK
Implantable Pulse Generator Implant Date: 20160325
MDC IDC SESS DTM: 20181111032016

## 2017-02-23 DIAGNOSIS — E669 Obesity, unspecified: Secondary | ICD-10-CM | POA: Diagnosis not present

## 2017-02-23 DIAGNOSIS — K4021 Bilateral inguinal hernia, without obstruction or gangrene, recurrent: Secondary | ICD-10-CM | POA: Diagnosis not present

## 2017-02-23 DIAGNOSIS — Z6835 Body mass index (BMI) 35.0-35.9, adult: Secondary | ICD-10-CM | POA: Diagnosis not present

## 2017-02-23 DIAGNOSIS — Z713 Dietary counseling and surveillance: Secondary | ICD-10-CM | POA: Diagnosis not present

## 2017-02-24 NOTE — Telephone Encounter (Signed)
Created in error

## 2017-03-15 ENCOUNTER — Ambulatory Visit (INDEPENDENT_AMBULATORY_CARE_PROVIDER_SITE_OTHER): Payer: BLUE CROSS/BLUE SHIELD | Admitting: *Deleted

## 2017-03-15 DIAGNOSIS — I4891 Unspecified atrial fibrillation: Secondary | ICD-10-CM

## 2017-03-16 DIAGNOSIS — E669 Obesity, unspecified: Secondary | ICD-10-CM | POA: Diagnosis not present

## 2017-03-16 DIAGNOSIS — Z6835 Body mass index (BMI) 35.0-35.9, adult: Secondary | ICD-10-CM | POA: Diagnosis not present

## 2017-03-16 DIAGNOSIS — Z713 Dietary counseling and surveillance: Secondary | ICD-10-CM | POA: Diagnosis not present

## 2017-03-16 NOTE — Progress Notes (Signed)
Carelink Summary Report / Loop Recorder 

## 2017-03-22 LAB — CUP PACEART REMOTE DEVICE CHECK
Implantable Pulse Generator Implant Date: 20160325
MDC IDC SESS DTM: 20181211034012

## 2017-03-23 ENCOUNTER — Ambulatory Visit: Payer: Self-pay | Admitting: General Surgery

## 2017-03-23 DIAGNOSIS — Z6835 Body mass index (BMI) 35.0-35.9, adult: Secondary | ICD-10-CM | POA: Diagnosis not present

## 2017-03-23 DIAGNOSIS — Z713 Dietary counseling and surveillance: Secondary | ICD-10-CM | POA: Diagnosis not present

## 2017-03-23 DIAGNOSIS — E669 Obesity, unspecified: Secondary | ICD-10-CM | POA: Diagnosis not present

## 2017-03-23 DIAGNOSIS — J069 Acute upper respiratory infection, unspecified: Secondary | ICD-10-CM | POA: Diagnosis not present

## 2017-04-12 ENCOUNTER — Ambulatory Visit: Admit: 2017-04-12 | Payer: BLUE CROSS/BLUE SHIELD | Admitting: General Surgery

## 2017-04-12 SURGERY — REPAIR, HERNIA, INGUINAL, BILATERAL, ADULT
Anesthesia: General

## 2017-04-14 ENCOUNTER — Ambulatory Visit (INDEPENDENT_AMBULATORY_CARE_PROVIDER_SITE_OTHER): Payer: BLUE CROSS/BLUE SHIELD | Admitting: *Deleted

## 2017-04-14 DIAGNOSIS — I4891 Unspecified atrial fibrillation: Secondary | ICD-10-CM

## 2017-04-15 NOTE — Progress Notes (Signed)
Carelink Summary Report / Loop recorder 

## 2017-04-27 LAB — CUP PACEART REMOTE DEVICE CHECK
Date Time Interrogation Session: 20190110033945
Implantable Pulse Generator Implant Date: 20160325

## 2017-04-28 ENCOUNTER — Other Ambulatory Visit: Payer: Self-pay | Admitting: Family Medicine

## 2017-04-28 ENCOUNTER — Other Ambulatory Visit: Payer: Self-pay | Admitting: Internal Medicine

## 2017-04-28 ENCOUNTER — Other Ambulatory Visit: Payer: Self-pay | Admitting: Physician Assistant

## 2017-04-29 NOTE — Telephone Encounter (Signed)
30 d worth needs appt

## 2017-04-29 NOTE — Telephone Encounter (Signed)
This is Dr. Koneswaran's pt. °

## 2017-04-29 NOTE — Telephone Encounter (Signed)
AVS Reports   Date/Time Report Action User  02/18/2017 2:11 PM After Visit Summary Printed Bernita Raisin, RN  02/18/2017 1:42 PM After Visit Summary Printed Bernita Raisin, RN  Patient Instructions   Your physician wants you to follow-up in:  6 months with Dr.Koneswaran You will receive a reminder letter in the mail two months in advance. If you don't receive a letter, please call our office to schedule the follow-up appointment.     STOP Plavix

## 2017-05-14 ENCOUNTER — Ambulatory Visit (INDEPENDENT_AMBULATORY_CARE_PROVIDER_SITE_OTHER): Payer: BLUE CROSS/BLUE SHIELD | Admitting: *Deleted

## 2017-05-14 DIAGNOSIS — I4891 Unspecified atrial fibrillation: Secondary | ICD-10-CM

## 2017-05-16 ENCOUNTER — Other Ambulatory Visit: Payer: Self-pay | Admitting: Internal Medicine

## 2017-05-17 NOTE — Progress Notes (Signed)
Carelink Summary Report / Loop Recorder 

## 2017-05-20 ENCOUNTER — Other Ambulatory Visit: Payer: Self-pay | Admitting: Internal Medicine

## 2017-06-01 NOTE — Pre-Procedure Instructions (Signed)
Austin Woods  06/01/2017      [facility] [address] [place] [address] Phone: [phone] Fax: [phone]    Your procedure is scheduled on  Friday 06/04/17  Report to [facility] Admitting at 945 A.M.  Call this number if you have problems the morning of surgery:  [phone]   Remember:  Do not eat food or drink liquids after midnight. DRINK ENSURE PRE- SURGERY BEFORE LEAVING HOME Friday MORNING.   Take these medicines the morning of surgery with A SIP OF WATER - ALBUTEROL INHALER, TIKOSYN, ADVAIR, VISTARIL, METOPROLOL (TOPROL), OXYBUTYNIN (DITROPAN), PANTOPRAZOLE (PROTONIX)  7 days prior to surgery STOP taking any Aspirin(unless otherwise instructed by your surgeon), Aleve, Naproxen, Ibuprofen, Motrin, Advil, Goody's, BC's, all herbal medications, fish oil, and all vitamins   Do not wear jewelry, make-up or nail polish.  Do not wear lotions, powders, or perfumes, or deodorant.  Do not shave 48 hours prior to surgery.  Men may shave face and neck.  Do not bring valuables to the hospital.  [facility] is not responsible for any belongings or valuables.  Contacts, dentures or bridgework may not be worn into surgery.  Leave your suitcase in the car.  After surgery it may be brought to your room.  For patients admitted to the hospital, discharge time will be determined by your treatment team.  Patients discharged the day of surgery will not be allowed to drive home.   Name and phone number of your driver:    Special instructions:  [facility] - Preparing for Surgery  Before surgery, you can play an important role.  Because skin is not sterile, your skin needs to be as free of germs as possible.  You can reduce the number of germs on you skin by washing with CHG (chlorahexidine gluconate) soap before surgery.  CHG is an antiseptic cleaner which kills germs and bonds with the skin to continue killing germs even after  washing.  Please DO NOT use if you have an allergy to CHG or antibacterial soaps.  If your skin becomes reddened/irritated stop using the CHG and inform your nurse when you arrive at Short Stay.  Do not shave (including legs and underarms) for at least 48 hours prior to the first CHG shower.  You may shave your face.  Please follow these instructions carefully:   1.  Shower with CHG Soap the night before surgery and the                                morning of Surgery.  2.  If you choose to wash your hair, wash your hair first as usual with your       normal shampoo.  3.  After you shampoo, rinse your hair and body thoroughly to remove the                      Shampoo.  4.  Use CHG as you would any other liquid soap.  You can apply chg directly       to the skin and wash gently with scrungie or a clean washcloth.  5.  Apply the CHG Soap to your body ONLY FROM THE NECK DOWN.        Do not use on open wounds or open sores.  Avoid contact with your eyes,  ears, mouth and genitals (private parts).  Wash genitals (private parts)       with your normal soap.  6.  Wash thoroughly, paying special attention to the area where your surgery        will be performed.  7.  Thoroughly rinse your body with warm water from the neck down.  8.  DO NOT shower/wash with your normal soap after using and rinsing off       the CHG Soap.  9.  Pat yourself dry with a clean towel.            10.  Wear clean pajamas.            11.  Place clean sheets on your bed the night of your first shower and do not        sleep with pets.  Day of Surgery  Do not apply any lotions/deoderants the morning of surgery.  Please wear clean clothes to the hospital/surgery center.    Please read over the following fact sheets that you were given. MRSA Information and Surgical Site Infection Prevention

## 2017-06-02 ENCOUNTER — Encounter (HOSPITAL_COMMUNITY)
Admission: RE | Admit: 2017-06-02 | Discharge: 2017-06-02 | Disposition: A | Discharge status: Home / Self Care | Payer: BLUE CROSS/BLUE SHIELD | Source: Ambulatory Visit | Attending: General Surgery | Admitting: General Surgery

## 2017-06-02 ENCOUNTER — Encounter (HOSPITAL_COMMUNITY): Payer: Self-pay

## 2017-06-02 ENCOUNTER — Other Ambulatory Visit: Payer: Self-pay

## 2017-06-02 DIAGNOSIS — D176 Benign lipomatous neoplasm of spermatic cord: Secondary | ICD-10-CM | POA: Diagnosis not present

## 2017-06-02 DIAGNOSIS — Z01818 Encounter for other preprocedural examination: Secondary | ICD-10-CM | POA: Diagnosis not present

## 2017-06-02 DIAGNOSIS — I110 Hypertensive heart disease with heart failure: Secondary | ICD-10-CM | POA: Diagnosis not present

## 2017-06-02 DIAGNOSIS — K219 Gastro-esophageal reflux disease without esophagitis: Secondary | ICD-10-CM | POA: Diagnosis not present

## 2017-06-02 DIAGNOSIS — I4891 Unspecified atrial fibrillation: Secondary | ICD-10-CM | POA: Diagnosis not present

## 2017-06-02 DIAGNOSIS — Z79899 Other long term (current) drug therapy: Secondary | ICD-10-CM | POA: Diagnosis not present

## 2017-06-02 DIAGNOSIS — Z8249 Family history of ischemic heart disease and other diseases of the circulatory system: Secondary | ICD-10-CM | POA: Diagnosis not present

## 2017-06-02 DIAGNOSIS — Z87891 Personal history of nicotine dependence: Secondary | ICD-10-CM | POA: Diagnosis not present

## 2017-06-02 DIAGNOSIS — Z7901 Long term (current) use of anticoagulants: Secondary | ICD-10-CM | POA: Diagnosis not present

## 2017-06-02 DIAGNOSIS — E7800 Pure hypercholesterolemia, unspecified: Secondary | ICD-10-CM | POA: Diagnosis not present

## 2017-06-02 DIAGNOSIS — K4020 Bilateral inguinal hernia, without obstruction or gangrene, not specified as recurrent: Secondary | ICD-10-CM | POA: Diagnosis not present

## 2017-06-02 DIAGNOSIS — J45909 Unspecified asthma, uncomplicated: Secondary | ICD-10-CM | POA: Diagnosis not present

## 2017-06-02 DIAGNOSIS — I509 Heart failure, unspecified: Secondary | ICD-10-CM | POA: Diagnosis not present

## 2017-06-02 DIAGNOSIS — G4730 Sleep apnea, unspecified: Secondary | ICD-10-CM | POA: Diagnosis not present

## 2017-06-02 DIAGNOSIS — Z7982 Long term (current) use of aspirin: Secondary | ICD-10-CM | POA: Diagnosis not present

## 2017-06-02 HISTORY — DX: Unspecified osteoarthritis, unspecified site: M19.90

## 2017-06-02 HISTORY — DX: Unspecified atrial fibrillation: I48.91

## 2017-06-02 HISTORY — DX: Cardiac arrhythmia, unspecified: I49.9

## 2017-06-02 HISTORY — DX: Atherosclerotic heart disease of native coronary artery without angina pectoris: I25.10

## 2017-06-02 LAB — CBC WITH DIFFERENTIAL/PLATELET
BASOS PCT: 0 %
Basophils Absolute: 0 10*3/uL (ref 0.0–0.1)
EOS ABS: 0.1 10*3/uL (ref 0.0–0.7)
Eosinophils Relative: 1 %
HCT: 44.2 % (ref 39.0–52.0)
HEMOGLOBIN: 15 g/dL (ref 13.0–17.0)
Lymphocytes Relative: 22 %
Lymphs Abs: 2.1 10*3/uL (ref 0.7–4.0)
MCH: 31.7 pg (ref 26.0–34.0)
MCHC: 33.9 g/dL (ref 30.0–36.0)
MCV: 93.4 fL (ref 78.0–100.0)
Monocytes Absolute: 0.5 10*3/uL (ref 0.1–1.0)
Monocytes Relative: 6 %
NEUTROS ABS: 6.9 10*3/uL (ref 1.7–7.7)
NEUTROS PCT: 71 %
Platelets: 284 10*3/uL (ref 150–400)
RBC: 4.73 MIL/uL (ref 4.22–5.81)
RDW: 12.8 % (ref 11.5–15.5)
WBC: 9.7 10*3/uL (ref 4.0–10.5)

## 2017-06-02 LAB — BASIC METABOLIC PANEL
ANION GAP: 11 (ref 5–15)
BUN: 14 mg/dL (ref 6–20)
CHLORIDE: 103 mmol/L (ref 101–111)
CO2: 24 mmol/L (ref 22–32)
CREATININE: 1.15 mg/dL (ref 0.61–1.24)
Calcium: 9.4 mg/dL (ref 8.9–10.3)
GFR calc non Af Amer: >60 mL/min (ref >60)
Glucose, Bld: 135 mg/dL — ABNORMAL HIGH (ref 65–99)
Potassium: 3.9 mmol/L (ref 3.5–5.1)
SODIUM: 138 mmol/L (ref 135–145)

## 2017-06-02 LAB — PROTIME-INR
INR: 1.04
PROTHROMBIN TIME: 13.5 s (ref 11.4–15.2)

## 2017-06-03 NOTE — Progress Notes (Signed)
Anesthesia Chart Review:  Pt is a 52 year old male scheduled for open B inguinal hernia repair on 06/04/2017 with Judeth Horn, MD  - PCP is Baltazar Apo, MD - Cardiolologist is Kate Sable, MD who cleared pt for surgery. Last office visit 02/18/17  PMH includes:  CAD (PCI to LAD 02/03/16), WPW (s/p RFCA 2001), persistent atrial fibrillation, loop recorder, HTN, hyperlipidemia, OSA, DVT (after WPW ablation 2001), asthma, GERD. Former smoker (quit 2012). BMI 36  Medications include: Albuterol, ASA 81 mg, Lipitor, Tikosyn, enalapril, Advair, Lasix, metoprolol, potassium, Xarelto.  Last dose Xarelto 05/31/17   BP 126/71   Pulse 77   Temp 36.7 C   Resp 20   Ht 6\' 1"  (1.854 m)   Wt 272 lb 8 oz (123.6 kg)   SpO2 98%   BMI 35.95 kg/m   Preoperative labs reviewed.    CXR 06/02/16: No active cardiopulmonary disease.  Stable cardiomegaly  EKG 11/27/16: NSR with sinus arrhythmia. Inferior infarct, age undetermined.   Cardiac cath 02/03/16:  1. Mild to moderate LMCA and moderate to severe proximal/mid LAD and D1 disease. FFR of proximal/mid LAD stenosis was significant at 0.72 with greatest pressure drop occurring across this lesion. 2. Normal left ventricular filling pressure. 3. Successful FFR guided PCI to proximal/mid LAD with 0% residual stenosis and TIMI-3 flow. Prevention to mid LAD resulted in total occlusion of the ostium of D1. TIMI-3 flow was successfully restored with 30% residual stenosis using balloon angioplasty with kissing balloon inflation.  Echo 01/31/16:  - Left ventricle: The cavity size was mildly dilated. Systolic function was mildly reduced. The estimated ejection fraction was in the range of 45% to 50%. Moderate hypokinesis of the apical myocardium. Left ventricular diastolic function parameters were normal for the patient&'s age.  If no changes, I anticipate pt can proceed with surgery as scheduled.   Willeen Cass, FNP-BC [facility]/Anesthesiology Phone: [phone] 06/03/2017 12:51 PM

## 2017-06-04 ENCOUNTER — Encounter (HOSPITAL_COMMUNITY)
Admission: RE | Disposition: A | Discharge status: Home / Self Care | Payer: Self-pay | Source: Ambulatory Visit | Attending: General Surgery

## 2017-06-04 ENCOUNTER — Ambulatory Visit (HOSPITAL_COMMUNITY): Payer: BLUE CROSS/BLUE SHIELD | Admitting: Emergency Medicine

## 2017-06-04 ENCOUNTER — Ambulatory Visit (HOSPITAL_COMMUNITY)
Admission: RE | Admit: 2017-06-04 | Discharge: 2017-06-04 | Disposition: A | Discharge status: Home / Self Care | Payer: BLUE CROSS/BLUE SHIELD | Source: Ambulatory Visit | Attending: General Surgery | Admitting: General Surgery

## 2017-06-04 ENCOUNTER — Ambulatory Visit (HOSPITAL_COMMUNITY): Payer: BLUE CROSS/BLUE SHIELD | Admitting: Anesthesiology

## 2017-06-04 ENCOUNTER — Encounter (HOSPITAL_COMMUNITY): Payer: Self-pay | Admitting: Certified Registered"

## 2017-06-04 DIAGNOSIS — Z7901 Long term (current) use of anticoagulants: Secondary | ICD-10-CM | POA: Insufficient documentation

## 2017-06-04 DIAGNOSIS — Z8249 Family history of ischemic heart disease and other diseases of the circulatory system: Secondary | ICD-10-CM | POA: Insufficient documentation

## 2017-06-04 DIAGNOSIS — K4020 Bilateral inguinal hernia, without obstruction or gangrene, not specified as recurrent: Secondary | ICD-10-CM | POA: Diagnosis not present

## 2017-06-04 DIAGNOSIS — Z79899 Other long term (current) drug therapy: Secondary | ICD-10-CM | POA: Insufficient documentation

## 2017-06-04 DIAGNOSIS — Z7982 Long term (current) use of aspirin: Secondary | ICD-10-CM | POA: Diagnosis not present

## 2017-06-04 DIAGNOSIS — D176 Benign lipomatous neoplasm of spermatic cord: Secondary | ICD-10-CM | POA: Diagnosis not present

## 2017-06-04 DIAGNOSIS — K219 Gastro-esophageal reflux disease without esophagitis: Secondary | ICD-10-CM | POA: Insufficient documentation

## 2017-06-04 DIAGNOSIS — G4730 Sleep apnea, unspecified: Secondary | ICD-10-CM | POA: Insufficient documentation

## 2017-06-04 DIAGNOSIS — I5023 Acute on chronic systolic (congestive) heart failure: Secondary | ICD-10-CM | POA: Diagnosis not present

## 2017-06-04 DIAGNOSIS — I110 Hypertensive heart disease with heart failure: Secondary | ICD-10-CM | POA: Diagnosis not present

## 2017-06-04 DIAGNOSIS — J45909 Unspecified asthma, uncomplicated: Secondary | ICD-10-CM | POA: Diagnosis not present

## 2017-06-04 DIAGNOSIS — I4891 Unspecified atrial fibrillation: Secondary | ICD-10-CM | POA: Diagnosis not present

## 2017-06-04 DIAGNOSIS — Z87891 Personal history of nicotine dependence: Secondary | ICD-10-CM | POA: Diagnosis not present

## 2017-06-04 DIAGNOSIS — I509 Heart failure, unspecified: Secondary | ICD-10-CM | POA: Insufficient documentation

## 2017-06-04 DIAGNOSIS — E7800 Pure hypercholesterolemia, unspecified: Secondary | ICD-10-CM | POA: Diagnosis not present

## 2017-06-04 HISTORY — PX: INGUINAL HERNIA REPAIR: SHX194

## 2017-06-04 SURGERY — REPAIR, HERNIA, INGUINAL, ADULT
Anesthesia: General | Site: Groin | Laterality: Bilateral

## 2017-06-04 MED ORDER — BUPIVACAINE HCL (PF) 0.25 % IJ SOLN
INTRAMUSCULAR | Status: AC
  Filled 2017-06-04: qty 30

## 2017-06-04 MED ORDER — PHENYLEPHRINE HCL 10 MG/ML IJ SOLN
INTRAMUSCULAR | Status: DC | PRN
  Administered 2017-06-04: 50 ug via INTRAVENOUS
  Administered 2017-06-04: 100 ug via INTRAVENOUS
  Administered 2017-06-04 (×3): 50 ug via INTRAVENOUS
  Administered 2017-06-04: 100 ug via INTRAVENOUS

## 2017-06-04 MED ORDER — LACTATED RINGERS IV SOLN
INTRAVENOUS | Status: DC | PRN
  Administered 2017-06-04: 12:00:00 via INTRAVENOUS

## 2017-06-04 MED ORDER — PROPOFOL 10 MG/ML IV BOLUS
INTRAVENOUS | Status: DC | PRN
  Administered 2017-06-04: 50 mg via INTRAVENOUS
  Administered 2017-06-04: 30 mg via INTRAVENOUS
  Administered 2017-06-04: 200 mg via INTRAVENOUS

## 2017-06-04 MED ORDER — LACTATED RINGERS IV SOLN
INTRAVENOUS | Status: DC
  Administered 2017-06-04: 50 mL/h via INTRAVENOUS

## 2017-06-04 MED ORDER — OXYCODONE HCL 5 MG PO TABS: 5.0000 mg | ORAL_TABLET | Freq: Four times a day (QID) | ORAL | 0 refills | Status: DC | PRN

## 2017-06-04 MED ORDER — MIDAZOLAM HCL 5 MG/5ML IJ SOLN
INTRAMUSCULAR | Status: DC | PRN
  Administered 2017-06-04: 2 mg via INTRAVENOUS

## 2017-06-04 MED ORDER — OXYCODONE HCL 5 MG PO TABS
10.0000 mg | ORAL_TABLET | Freq: Once | ORAL | Status: AC
  Administered 2017-06-04: 10 mg via ORAL

## 2017-06-04 MED ORDER — ONDANSETRON HCL 4 MG/2ML IJ SOLN
INTRAMUSCULAR | Status: DC | PRN
  Administered 2017-06-04: 4 mg via INTRAVENOUS

## 2017-06-04 MED ORDER — SODIUM CHLORIDE 0.9 % IV SOLN
INTRAVENOUS | Status: DC | PRN
  Administered 2017-06-04: 500 mL

## 2017-06-04 MED ORDER — DEXAMETHASONE SODIUM PHOSPHATE 10 MG/ML IJ SOLN
INTRAMUSCULAR | Status: DC | PRN
  Administered 2017-06-04: 8 mg via INTRAVENOUS

## 2017-06-04 MED ORDER — GLYCOPYRROLATE 0.2 MG/ML IJ SOLN
INTRAMUSCULAR | Status: DC | PRN
  Administered 2017-06-04 (×2): 0.1 mg via INTRAVENOUS

## 2017-06-04 MED ORDER — 0.9 % SODIUM CHLORIDE (POUR BTL) OPTIME
TOPICAL | Status: DC | PRN
  Administered 2017-06-04: 1000 mL

## 2017-06-04 MED ORDER — BUPIVACAINE HCL (PF) 0.25 % IJ SOLN
INTRAMUSCULAR | Status: DC | PRN
  Administered 2017-06-04: 20 mL

## 2017-06-04 MED ORDER — DEXTROSE 5 % IV SOLN
3.0000 g | INTRAVENOUS | Status: AC
  Administered 2017-06-04: 3 g via INTRAVENOUS
  Filled 2017-06-04: qty 3

## 2017-06-04 MED ORDER — OXYCODONE HCL 5 MG PO TABS
ORAL_TABLET | ORAL | Status: AC
  Administered 2017-06-04: 10 mg via ORAL
  Filled 2017-06-04: qty 2

## 2017-06-04 MED ORDER — MIDAZOLAM HCL 2 MG/2ML IJ SOLN
INTRAMUSCULAR | Status: AC
  Filled 2017-06-04: qty 2

## 2017-06-04 MED ORDER — LIDOCAINE HCL (CARDIAC) 20 MG/ML IV SOLN
INTRAVENOUS | Status: DC | PRN
  Administered 2017-06-04: 40 mg via INTRAVENOUS
  Administered 2017-06-04: 20 mg via INTRAVENOUS
  Administered 2017-06-04: 100 mg via INTRAVENOUS
  Administered 2017-06-04: 40 mg via INTRAVENOUS
  Administered 2017-06-04 (×2): 20 mg via INTRAVENOUS
  Administered 2017-06-04: 40 mg via INTRAVENOUS

## 2017-06-04 MED ORDER — CHLORHEXIDINE GLUCONATE CLOTH 2 % EX PADS: 6.0000 | MEDICATED_PAD | Freq: Once | CUTANEOUS | Status: DC

## 2017-06-04 MED ORDER — ROCURONIUM BROMIDE 100 MG/10ML IV SOLN
INTRAVENOUS | Status: DC | PRN
  Administered 2017-06-04: 60 mg via INTRAVENOUS
  Administered 2017-06-04: 10 mg via INTRAVENOUS
  Administered 2017-06-04: 5 mg via INTRAVENOUS
  Administered 2017-06-04: 15 mg via INTRAVENOUS
  Administered 2017-06-04: 5 mg via INTRAVENOUS

## 2017-06-04 MED ORDER — FENTANYL CITRATE (PF) 250 MCG/5ML IJ SOLN
INTRAMUSCULAR | Status: AC
  Filled 2017-06-04: qty 5

## 2017-06-04 MED ORDER — FENTANYL CITRATE (PF) 100 MCG/2ML IJ SOLN
INTRAMUSCULAR | Status: DC | PRN
  Administered 2017-06-04 (×3): 25 ug via INTRAVENOUS
  Administered 2017-06-04: 75 ug via INTRAVENOUS
  Administered 2017-06-04: 100 ug via INTRAVENOUS

## 2017-06-04 MED ORDER — SUGAMMADEX SODIUM 200 MG/2ML IV SOLN
INTRAVENOUS | Status: DC | PRN
  Administered 2017-06-04: 247.2 mg via INTRAVENOUS

## 2017-06-04 SURGICAL SUPPLY — 49 items
BAG DECANTER FOR FLEXI CONT (MISCELLANEOUS) ×2 IMPLANT
BLADE CLIPPER SURG (BLADE) ×2 IMPLANT
BLADE SURG 10 STRL SS (BLADE) ×2 IMPLANT
BLADE SURG 15 STRL LF DISP TIS (BLADE) ×1 IMPLANT
BLADE SURG 15 STRL SS (BLADE) ×1
CANISTER SUCT 3000ML PPV (MISCELLANEOUS) IMPLANT
CHLORAPREP W/TINT 26ML (MISCELLANEOUS) ×2 IMPLANT
CLEANER TIP ELECTROSURG 2X2 (MISCELLANEOUS) ×2 IMPLANT
COVER SURGICAL LIGHT HANDLE (MISCELLANEOUS) ×2 IMPLANT
DERMABOND ADVANCED (GAUZE/BANDAGES/DRESSINGS) ×2
DERMABOND ADVANCED .7 DNX12 (GAUZE/BANDAGES/DRESSINGS) ×2 IMPLANT
DRAIN PENROSE 1/2X12 LTX STRL (WOUND CARE) ×2 IMPLANT
DRAPE LAPAROTOMY TRNSV 102X78 (DRAPE) ×2 IMPLANT
DRAPE UTILITY XL STRL (DRAPES) ×2 IMPLANT
DRSG TEGADERM 4X4.75 (GAUZE/BANDAGES/DRESSINGS) ×2 IMPLANT
ELECT REM PT RETURN 9FT ADLT (ELECTROSURGICAL) ×2
ELECTRODE REM PT RTRN 9FT ADLT (ELECTROSURGICAL) ×1 IMPLANT
GAUZE SPONGE 4X4 16PLY XRAY LF (GAUZE/BANDAGES/DRESSINGS) ×2 IMPLANT
GLOVE BIOGEL PI IND STRL 8 (GLOVE) ×1 IMPLANT
GLOVE BIOGEL PI INDICATOR 8 (GLOVE) ×1
GLOVE ECLIPSE 7.5 STRL STRAW (GLOVE) ×2 IMPLANT
GOWN STRL REUS W/ TWL LRG LVL3 (GOWN DISPOSABLE) ×2 IMPLANT
GOWN STRL REUS W/TWL LRG LVL3 (GOWN DISPOSABLE) ×2
KIT BASIN OR (CUSTOM PROCEDURE TRAY) ×2 IMPLANT
KIT ROOM TURNOVER OR (KITS) ×2 IMPLANT
MESH HERNIA 3X6 (Mesh General) ×2 IMPLANT
NEEDLE HYPO 25GX1X1/2 BEV (NEEDLE) ×2 IMPLANT
NS IRRIG 1000ML POUR BTL (IV SOLUTION) ×2 IMPLANT
PACK SURGICAL SETUP 50X90 (CUSTOM PROCEDURE TRAY) ×2 IMPLANT
PAD ARMBOARD 7.5X6 YLW CONV (MISCELLANEOUS) ×2 IMPLANT
PENCIL BUTTON HOLSTER BLD 10FT (ELECTRODE) ×2 IMPLANT
SPECIMEN JAR SMALL (MISCELLANEOUS) IMPLANT
SPONGE INTESTINAL PEANUT (DISPOSABLE) ×2 IMPLANT
SPONGE LAP 18X18 X RAY DECT (DISPOSABLE) ×2 IMPLANT
STRIP CLOSURE SKIN 1/2X4 (GAUZE/BANDAGES/DRESSINGS) ×2 IMPLANT
SUT ETHIBOND 0 MO6 C/R (SUTURE) ×2 IMPLANT
SUT MNCRL AB 4-0 PS2 18 (SUTURE) ×2 IMPLANT
SUT MON AB 4-0 PC3 18 (SUTURE) ×2 IMPLANT
SUT PROLENE 0 CT 2 (SUTURE) ×8 IMPLANT
SUT VIC AB 3-0 SH 27 (SUTURE) ×4
SUT VIC AB 3-0 SH 27X BRD (SUTURE) ×3 IMPLANT
SUT VIC AB 3-0 SH 27XBRD (SUTURE) ×1 IMPLANT
SUT VICRYL AB 3 0 TIES (SUTURE) ×2 IMPLANT
SYR BULB 3OZ (MISCELLANEOUS) ×2 IMPLANT
SYR CONTROL 10ML LL (SYRINGE) ×2 IMPLANT
TOWEL OR 17X24 6PK STRL BLUE (TOWEL DISPOSABLE) ×2 IMPLANT
TOWEL OR 17X26 10 PK STRL BLUE (TOWEL DISPOSABLE) ×2 IMPLANT
TUBE CONNECTING 12X1/4 (SUCTIONS) IMPLANT
YANKAUER SUCT BULB TIP NO VENT (SUCTIONS) IMPLANT

## 2017-06-04 NOTE — Transfer of Care (Signed)
Immediate Anesthesia Transfer of Care Note  Patient: Austin Woods  Procedure(s) Performed: OPEN BILATERAL INGUINAL HERNIA REPAIR (Bilateral Groin)  Patient Location: PACU  Anesthesia Type:General  Level of Consciousness: awake, alert , oriented and patient cooperative  Airway & Oxygen Therapy: Patient Spontanous Breathing and Patient connected to face mask oxygen  Post-op Assessment: Report given to RN and Post -op Vital signs reviewed and stable  Post vital signs: Reviewed and stable  Last Vitals:  Vitals:   06/04/17 0959 06/04/17 1500  BP: 135/71 (!) 125/58  Pulse: 69 77  Resp: 20 (!) 22  Temp: 36.7 C   SpO2: 96% 100%    Last Pain:  Vitals:   06/04/17 1031  TempSrc:   PainSc: 7       Patients Stated Pain Goal: 3 (05/06/41 8887)  Complications: No apparent anesthesia complications

## 2017-06-04 NOTE — Anesthesia Procedure Notes (Signed)
Procedure Name: Intubation Date/Time: 06/04/2017 12:23 PM Performed by: Adalberto Ill, CRNA Pre-anesthesia Checklist: Emergency Drugs available, Patient identified, Suction available, Patient being monitored and Timeout performed Patient Re-evaluated:Patient Re-evaluated prior to induction Oxygen Delivery Method: Circle system utilized Preoxygenation: Pre-oxygenation with 100% oxygen Induction Type: IV induction Ventilation: Mask ventilation without difficulty Laryngoscope Size: Miller and 2 Grade View: Grade I Tube type: Oral Tube size: 7.5 mm Number of attempts: 1 Airway Equipment and Method: Stylet Placement Confirmation: ETT inserted through vocal cords under direct vision,  positive ETCO2 and breath sounds checked- equal and bilateral Secured at: 24 cm Dental Injury: Teeth and Oropharynx as per pre-operative assessment

## 2017-06-04 NOTE — Anesthesia Preprocedure Evaluation (Addendum)
Anesthesia Evaluation  Patient identified by MRN, date of birth, ID band Patient awake    Reviewed: Allergy & Precautions, NPO status , Patient's Chart, lab work & pertinent test results  Airway Mallampati: IV  TM Distance: >3 FB Neck ROM: Full    Dental no notable dental hx.    Pulmonary sleep apnea , former smoker,    Pulmonary exam normal breath sounds clear to auscultation       Cardiovascular hypertension, Normal cardiovascular exam+ dysrhythmias Atrial Fibrillation  Rhythm:Regular Rate:Normal     Neuro/Psych    GI/Hepatic   Endo/Other    Renal/GU      Musculoskeletal   Abdominal   Peds  Hematology   Anesthesia Other Findings   Reproductive/Obstetrics                            Anesthesia Physical Anesthesia Plan  ASA: III  Anesthesia Plan: General   Post-op Pain Management:    Induction: Intravenous  PONV Risk Score and Plan: Treatment may vary due to age or medical condition  Airway Management Planned: Oral ETT and Video Laryngoscope Planned  Additional Equipment:   Intra-op Plan:   Post-operative Plan: Extubation in OR  Informed Consent: I have reviewed the patients History and Physical, chart, labs and discussed the procedure including the risks, benefits and alternatives for the proposed anesthesia with the patient or authorized representative who has indicated his/her understanding and acceptance.   Dental advisory given  Plan Discussed with: CRNA  Anesthesia Plan Comments:        Anesthesia Quick Evaluation

## 2017-06-04 NOTE — Progress Notes (Signed)
Patient attempted to void three times but to no avail. Dr. Hulen Skains aware. Patient don't have to void post-op. Bladder scan 34ml.Marland Kitchen

## 2017-06-04 NOTE — Discharge Instructions (Addendum)
Open Inguinal Hernia Repair, Adult, Care After This sheet gives you information about how to care for yourself after your procedure. Your health care provider may also give you more specific instructions. If you have problems or questions, contact your health care provider. What can I expect after the procedure? After the procedure, it is common to have:  Pain.  Swelling and bruising around the incision area.  Scrotal swelling, in men.  Some fluid or blood draining from your incisions.  Follow these instructions at home: Incision care  Follow instructions from your health care provider about how to take care of your incisions. Make sure you: ? Wash your hands with soap and water before you change your bandage (dressing). If soap and water are not available, use hand sanitizer. ? Change your dressing as told by your health care provider. ? Leave stitches (sutures), skin glue, or adhesive strips in place. These skin closures may need to stay in place for 2 weeks or longer. If adhesive strip edges start to loosen and curl up, you may trim the loose edges. Do not remove adhesive strips completely unless your health care provider tells you to do that.  Check your incision area every day for signs of infection. Check for: ? More redness, swelling, or pain. ? More fluid or blood. ? Warmth. ? Pus or a bad smell.  Wear loose, soft clothing while your incisions heal. Driving  Do not drive or use heavy machinery while taking prescription pain medicine.  Do not drive for 24 hours if you were given a medicine to help you relax (sedative) during your procedure. Activity  Do not lift anything that is heavier than 10 lb (4.5 kg), or the limit that you are told, until your health care provider says that it is safe.  Ask your health care provider what activities are safe for you.A lot of activity during the first week after surgery can increase pain and swelling. For 1 week after your  procedure: ? Avoid activities that take a lot of effort, such as exercise or sports. ? You may walk and climb stairs as needed for daily activity, but avoid long walks or climbing stairs for exercise. Managing pain and swelling  Put ice on painful or swollen areas: ? Put ice in a plastic bag. ? Place a towel between your skin and the bag. ? Leave the ice on for 20 minutes, 2-3 times a day. General instructions  Do not take baths, swim, or use a hot tub until your health care provider approves. Ask your health care provider if you may take showers. You may only be allowed to take sponge baths.  Take over-the-counter and prescription medicines only as told by your health care provider.  To prevent or treat constipation while you are taking prescription pain medicine, your health care provider may recommend that you: ? Drink enough fluid to keep your urine pale yellow. ? Take over-the-counter or prescription medicines. ? Eat foods that are high in fiber, such as fresh fruits and vegetables, whole grains, and beans. ? Limit foods that are high in fat and processed sugars, such as fried and sweet foods.  Do not use any products that contain nicotine or tobacco, such as cigarettes and e-cigarettes. If you need help quitting, ask your health care provider.  Drink enough fluid to keep your urine pale yellow.  Keep all follow-up visits as told by your health care provider. This is important. Contact a health care provider if:  You  have more redness, swelling, or pain around your incisions or your groin area.  You have more swelling in your scrotum.  You have more fluid or blood coming from your incisions.  Your incisions feel warm to the touch.  You have severe pain and medicines do not help.  You have abdominal pain or swelling.  You cannot eat or drink without vomiting.  You cannot urinate or pass a bowel movement.  You faint.  You feel dizzy.  You have nausea and  vomiting.  You have a fever. Get help right away if:  You have pus or a bad smell coming from your incisions.  You have chest pain.  You have problems breathing. Summary  Pain, swelling, and bruising are common after the procedure.  Check your incision area every day for signs of infection, such as more redness, swelling, or pain.  Put ice on painful or swollen areas for 20 minutes, 2-3 times a day. This information is not intended to replace advice given to you by your health care provider. Make sure you discuss any questions you have with your health care provider. Document Released: 07/02/2016 Document Revised: 07/02/2016 Document Reviewed: 07/02/2016 Elsevier Interactive Patient Education  2018 [employer].

## 2017-06-04 NOTE — Anesthesia Postprocedure Evaluation (Signed)
Anesthesia Post Note  Patient: Austin Woods  Procedure(s) Performed: OPEN BILATERAL INGUINAL HERNIA REPAIR (Bilateral Groin)     Patient location during evaluation: PACU Anesthesia Type: General Level of consciousness: awake and alert Pain management: pain level controlled Vital Signs Assessment: post-procedure vital signs reviewed and stable Respiratory status: spontaneous breathing, nonlabored ventilation, respiratory function stable and patient connected to nasal cannula oxygen Cardiovascular status: blood pressure returned to baseline and stable Postop Assessment: no apparent nausea or vomiting Anesthetic complications: no    Last Vitals:  Vitals:   06/04/17 1500 06/04/17 1515  BP: (!) 125/58 125/67  Pulse: 77 73  Resp: (!) 22 (!) 22  Temp: 36.5 C   SpO2: 100% 98%    Last Pain:  Vitals:   06/04/17 1500  TempSrc:   PainSc: 0-No pain                 Barnet Glasgow

## 2017-06-04 NOTE — Op Note (Signed)
OPERATIVE REPORT  DATE OF OPERATION: 06/04/2017  PATIENT:  Austin Woods  52 y.o. male  PRE-OPERATIVE DIAGNOSIS:  BILATERAL SMALL INGUINAL HERNIA  POST-OPERATIVE DIAGNOSIS:  BILATERAL SMALL INGUINAL HERNIA/LEFT LARGE LIPOMA, LARGE DIRECT INGUINAL HERNIA, LARGE DIRECT AND INDIRECT RIGHT INGUINAL HERNIA WITH LARGE RIGHT LIPOMA OF THE CORD  INDICATION(S) FOR OPERATION:  Symptomatic bilateral inguinal hernia  FINDINGS:  Large left lipoma and direct hernia, direct and indirect right inguinal hernia.  Bilateral lipomas of the cord  PROCEDURE:  Procedure(s): OPEN BILATERAL INGUINAL HERNIA REPAIR  SURGEON:  Surgeon(s): Judeth Horn, MD  ASSISTANT: RNFA, [place],   ANESTHESIA:   general  COMPLICATIONS:  None  EBL: 50 ml  BLOOD ADMINISTERED: none  DRAINS: none   SPECIMEN:  No Specimen  COUNTS CORRECT:  YES  PROCEDURE DETAILS: Patient was taken to the operating room and placed on table in supine position.  After adequate general endotracheal anesthetic was administered, he was prepped and draped in usual sterile manner exposing bilateral inguinal areas.  A proper timeout was performed identifying the patient and the procedure to be performed.  The procedures on both sides were very similar with 1 exception that will be dictated separately.  A left inguinal incision made approximately 10 cm long is #10 blade taken down to the very deep subcutaneous tissue.  We dissected down to the external oblique fascia ligated veins with 3-0 Vicryl.  We opened the fascia of the external oblique through the superficial ring the patient had a large swollen left spermatic lower structure.  We mobilized this at the pubic tubercle with a Penrose drain and subsequently dissected out the structures of this large cord.  The patient had a large lipoma which extends towards the scrotum that we dissected free and excised at its base.  Once we got close to the base with to see that this lipoma was associated  with a large direct sac which was subsequently able to present back into an area of the transversalis fascia underneath the conjoined tendon and placed some stay sutures able to keep it in place with 0 Ethibond suture.  Once this was done we were able to repair the floor using an oval piece of polypropylene mesh sewn in place with 0 Prolene suture.  Once this was drawn we irrigated with antibiotic solution which the mesh been soaked.  We closed the external oblique fascia using 3-0 Vicryl in Scarpa's fascia with interrupted 3-0 Vicryl.  The skin was closed at the end using running subcuticular stitch of 3-0 Monocryl.  0.25% Marcaine without epinephrine was injected in the skin prior to closure.  The right side was closed in a similar manner and repaired however the only difference that the patient had an indirect sac had to be ligated at the base.  This was done with an 0 Ethibond suture.  We also placed a stitch on top of the spermatic cord outside of the canal because it seemed to be loose enough to allow for herniation just lateral to the cord.  Everything else was sustained.  Inject this site and closed in a similar manner.  For complete closure on both sides Dermabond Steri-Strips and Tegaderms were used.  All needle counts, sponge counts, and instrument counts were correct.  PATIENT DISPOSITION:  PACU - hemodynamically stable.   Judeth Horn 3/1/20192:51 PM

## 2017-06-04 NOTE — H&P (Signed)
Austin Woods 02/23/2017 8:52 AM Location: [facility] Patient #: [id number] DOB: [birth year] Married / Language: Cleophus Woods / Race: White Male   History of Present Illness Austin Woods. Arlethia Basso MD; 02/23/2017 9:20 AM) The patient is a 52 year old male who presents with an inguinal hernia. The hernia(s) is/are located on both sides (Symptomatic only on the right side where he had a previous pseudoaneurysm after a procedure). Symptoms include inguinal pain. The pain is located in the right inguinal area and in the right lower quadrant. The pain radiates to the right hemiscrotum. Onset was sudden 3 month(s) ago. The episodes occur daily. The patient describes this as worsening.   Past Surgical History Austin Woods, [place]; 02/23/2017 8:52 AM) Spinal Surgery Midback   Allergies Austin Woods, [place]; 02/23/2017 8:52 AM) No Known Allergies 02/23/2017  Medication History Austin Woods, [place]; 02/23/2017 9:04 AM) Acetaminophen (500MG  Tablet, 1000 Oral every six hours as needed) Active. Albuterol Sulfate HFA (108 (90 Base)MCG/ACT Aerosol Soln, Inhalation every four hours as needed) Active. Atorvastatin Calcium (10MG  Tablet, Oral) Active. Vitamin D (2000UNIT Capsule, Oral) Active. Dofetilide (500MCG Capsule, Oral two times daily) Active. Enalapril Maleate (10MG  Tablet, Oral two times daily) Active. Advair Diskus (250-50MCG/DOSE Aero Pow Br Act, Inhalation every twelve hours as needed) Active. Furosemide (40MG  Tablet, Oral) Active. Hydrocortisone Ace (Rectal) (2.5% Cream, Rectal) Active. HydrOXYzine Pamoate (25MG  Capsule, 2 caps Oral three times daily as needed) Active. Ketoconazole (2% Cream, External) Active. Magnesium (500MG  Tablet, Oral) Active. Metoprolol Succinate ER (100MG  Tablet ER 24HR, Oral) Active. Nitroglycerin (0.4MG  Tab Sublingual, Sublingual) Active. Oxybutynin Chloride (5MG  Tablet, 1/2 tablet Oral two times daily as needed) Active. Pantoprazole  Sodium (40MG  Tablet DR, Oral) Active. Klor-Con M20 [facility] Tablet ER, Oral) Active. Rivaroxaban (20MG  Tablet, Oral with supper daily) Active. Medications Reconciled  Social History Austin Woods, [place]; 02/23/2017 8:52 AM) Alcohol use  Occasional alcohol use. Caffeine use  Carbonated beverages, Tea. No drug use  Tobacco use  Former smoker.  Family History Austin Woods, [place]; 02/23/2017 8:52 AM) Bleeding disorder  Mother. Diabetes Mellitus  Brother, Father, Mother. Heart Disease  Father. Heart disease in male family member before age 29  Hypertension  Brother, Father, Mother. Kidney Disease  Brother. Respiratory Condition  Brother.  Other Problems Austin Woods, [place]; 02/23/2017 8:52 AM) Asthma  Bladder Problems  Congestive Heart Failure  Gastroesophageal Reflux Disease  Hemorrhoids  High blood pressure  Hypercholesterolemia  Sleep Apnea     Review of Systems Austin Woods RMA; 02/23/2017 8:52 AM) General Present- Fatigue. Not Present- Appetite Loss, Chills, Fever, Night Sweats, Weight Gain and Weight Loss. HEENT Not Present- Earache, Hearing Loss, Hoarseness, Nose Bleed, Oral Ulcers, Ringing in the Ears, Seasonal Allergies, Sinus Pain, Sore Throat, Visual Disturbances, Wears glasses/contact lenses and Yellow Eyes. Respiratory Not Present- Bloody sputum, Chronic Cough, Difficulty Breathing, Snoring and Wheezing. Breast Not Present- Breast Mass, Breast Pain, Nipple Discharge and Skin Changes. Cardiovascular Present- Shortness of Breath and Swelling of Extremities. Not Present- Chest Pain, Difficulty Breathing Lying Down, Leg Cramps, Palpitations and Rapid Heart Rate. Gastrointestinal Not Present- Abdominal Pain, Bloating, Bloody Stool, Change in Bowel Habits, Chronic diarrhea, Constipation, Difficulty Swallowing, Excessive gas, Gets full quickly at meals, Hemorrhoids, Indigestion, Nausea, Rectal Pain and Vomiting. Male Genitourinary Not Present-  Blood in Urine, Change in Urinary Stream, Frequency, Impotence, Nocturia, Painful Urination, Urgency and Urine Leakage. Musculoskeletal Not Present- Back Pain, Joint Pain, Joint Stiffness, Muscle Pain, Muscle Weakness and Swelling of Extremities. Neurological Present- Numbness, Trouble walking and Weakness. Not Present- Decreased  Memory, Fainting, Headaches, Seizures, Tingling and Tremor. Psychiatric Present- Anxiety. Not Present- Bipolar, Change in Sleep Pattern, Depression, Fearful and Frequent crying. Endocrine Not Present- Cold Intolerance, Excessive Hunger, Hair Changes, Heat Intolerance, Hot flashes and New Diabetes. Hematology Present- Blood Thinners and Easy Bruising. Not Present- Excessive bleeding, Gland problems, HIV and Persistent Infections.  Vitals Austin Woods RMA; 02/23/2017 8:52 AM) 02/23/2017 8:52 AM Weight: 267 lb Height: 73in Body Surface Area: 2.43 m Body Mass Index: 35.23 kg/m  Temp.: 97.58F  Pulse: 57 (Regular)  BP: 130/90 (Sitting, Left Arm, Standard) Vitals are goon today.  BP 135/71  Physical Exam (Avery O. Hulen Skains MD; 02/23/2017 9:23 AM) General Mental Status-Alert. General Appearance-Well groomed. Note: Morbidly obese Orientation-Oriented X4. Build & Nutrition-Well nourished.  Chest and Lung Exam Chest and lung exam reveals -normal resonance, no flatness or dullness and on auscultation, normal breath sounds, no adventitious sounds and normal vocal resonance.  Cardiovascular Cardiovascular examination reveals -normal heart sounds, regular rate and rhythm with no murmurs(Regular rhythm and rate without murmur).  Abdomen Inspection Hernias - Inguinal hernia - Bilateral - Reducible. Note: Patient is so big that palpation of the acutal hernias seen on CT is very difficult, but they show easily on CT. Definitely no hard, incarcerated hernia in place. No changes in hernia   Assessment & Plan Austin Woods. Austin Castonguay MD; 02/23/2017 9:26  AM) BILATERAL RECURRENT INGUINAL HERNIA WITHOUT OBSTRUCTION OR GANGRENE (K40.21) Story: Has had pain in that area since surgery for pseudoaneurysm, but that has been taken care of by Dr. Trula Slade. Hernias bilaterally on CT Impression: Bilateral, small to moderate fat containing ingruinal hernias that will need to be fixed with mesh and openly. Patient has a strong cardiac history with afib, on Xarelto. He will need to come off the Xarelto for 3 days prior to surgery and he needs cardiac clearance from Dr. Bronson Ing, his cardiologist in [place]. Current Plans:  Bilateral open inguinal hernia repair with mesh  Kathryne Eriksson. Dahlia Bailiff, MD, [place] [phone] [facility]

## 2017-06-08 ENCOUNTER — Encounter (HOSPITAL_COMMUNITY): Payer: Self-pay | Admitting: General Surgery

## 2017-06-09 LAB — CUP PACEART REMOTE DEVICE CHECK
Date Time Interrogation Session: 20190209100055
MDC IDC PG IMPLANT DT: 20160325

## 2017-06-16 ENCOUNTER — Ambulatory Visit (INDEPENDENT_AMBULATORY_CARE_PROVIDER_SITE_OTHER): Payer: BLUE CROSS/BLUE SHIELD | Admitting: *Deleted

## 2017-06-16 DIAGNOSIS — I4891 Unspecified atrial fibrillation: Secondary | ICD-10-CM | POA: Diagnosis not present

## 2017-06-17 NOTE — Progress Notes (Signed)
Carelink Summary Report / Loop Recorder 

## 2017-06-29 DIAGNOSIS — E669 Obesity, unspecified: Secondary | ICD-10-CM | POA: Diagnosis not present

## 2017-06-29 DIAGNOSIS — Z6835 Body mass index (BMI) 35.0-35.9, adult: Secondary | ICD-10-CM | POA: Diagnosis not present

## 2017-06-29 DIAGNOSIS — Z008 Encounter for other general examination: Secondary | ICD-10-CM | POA: Diagnosis not present

## 2017-06-29 DIAGNOSIS — Z713 Dietary counseling and surveillance: Secondary | ICD-10-CM | POA: Diagnosis not present

## 2017-06-29 DIAGNOSIS — Z719 Counseling, unspecified: Secondary | ICD-10-CM | POA: Diagnosis not present

## 2017-07-01 ENCOUNTER — Encounter: Payer: Self-pay | Admitting: Family Medicine

## 2017-07-01 ENCOUNTER — Ambulatory Visit: Payer: BLUE CROSS/BLUE SHIELD | Admitting: Family Medicine

## 2017-07-01 VITALS — BP 138/86 | Temp 98.1°F | Ht 73.0 in | Wt 269.2 lb

## 2017-07-01 DIAGNOSIS — S46911A Strain of unspecified muscle, fascia and tendon at shoulder and upper arm level, right arm, initial encounter: Secondary | ICD-10-CM | POA: Diagnosis not present

## 2017-07-01 DIAGNOSIS — M25511 Pain in right shoulder: Secondary | ICD-10-CM | POA: Diagnosis not present

## 2017-07-01 MED ORDER — HYDROCODONE-ACETAMINOPHEN 5-325 MG PO TABS: ORAL_TABLET | ORAL | 0 refills | Status: DC

## 2017-07-01 MED ORDER — PREDNISONE 20 MG PO TABS: ORAL_TABLET | ORAL | 0 refills | Status: DC

## 2017-07-01 MED ORDER — TIZANIDINE HCL 4 MG PO CAPS: ORAL_CAPSULE | ORAL | 0 refills | Status: DC

## 2017-07-01 NOTE — Progress Notes (Signed)
   Subjective:    Patient ID: Austin Woods, male    DOB: 10/15/1965, 52 y.o.   MRN: [id number]   Shoulder Pain    The pain is present in the right shoulder. This is a new problem. The current episode started yesterday. The problem occurs constantly. The quality of the pain is described as pounding. The pain is at a severity of 10/10. The pain is moderate. The symptoms are aggravated by activity. He has tried acetaminophen for the symptoms. The treatment provided no relief.   Pt states he was trying to get up out of his chair last night to go to church and that's when his shoulder began hurting. He originally thought his arm was asleep.   Review of Systems No headache, no major weight loss or weight gain, no chest pain no back pain abdominal pain no change in bowel habits complete ROS otherwise negative     Objective:   Physical Exam Alert vitals stable, NAD. Blood pressure good on repeat. HEENT normal. Lungs clear. Heart regular rate and rhythm. Neck supple good range of motion shoulder good range of motion no evidence of impingement syndromeDistinct tenderness supraclavicular and supra scapular region right shoulder       Assessment & Plan:  Impression shoulder muscle strain rationale discussed hold off on imaging anti-inflammatory medicine prescribed.  Pain medicine prescribed.  Should improve with time

## 2017-07-12 ENCOUNTER — Ambulatory Visit (INDEPENDENT_AMBULATORY_CARE_PROVIDER_SITE_OTHER): Payer: BLUE CROSS/BLUE SHIELD | Admitting: Internal Medicine

## 2017-07-12 ENCOUNTER — Encounter: Payer: Self-pay | Admitting: Internal Medicine

## 2017-07-12 VITALS — BP 130/80 | HR 76 | Ht 73.0 in | Wt 272.0 lb

## 2017-07-12 DIAGNOSIS — I2510 Atherosclerotic heart disease of native coronary artery without angina pectoris: Secondary | ICD-10-CM

## 2017-07-12 DIAGNOSIS — I10 Essential (primary) hypertension: Secondary | ICD-10-CM | POA: Diagnosis not present

## 2017-07-12 DIAGNOSIS — I4819 Other persistent atrial fibrillation: Secondary | ICD-10-CM

## 2017-07-12 DIAGNOSIS — G4733 Obstructive sleep apnea (adult) (pediatric): Secondary | ICD-10-CM

## 2017-07-12 DIAGNOSIS — I481 Persistent atrial fibrillation: Secondary | ICD-10-CM | POA: Diagnosis not present

## 2017-07-12 LAB — CUP PACEART INCLINIC DEVICE CHECK
Date Time Interrogation Session: 20190408144735
Implantable Pulse Generator Implant Date: 20160325

## 2017-07-12 NOTE — Patient Instructions (Addendum)
Medication Instructions:  Your physician recommends that you continue on your current medications as directed. Please refer to the Current Medication list given to you today.  Labwork: You will get lab work today:  BMET and magnesium  Testing/Procedures: None ordered.  Follow-Up: You will follow up with Roderic Palau at the afib clinic in 6 months.  Your physician wants you to follow-up in: one year with Dr. Rayann Heman.   You will receive a reminder letter in the mail two months in advance. If you don't receive a letter, please call our office to schedule the follow-up appointment.  Any Other Special Instructions Will Be Listed Below (If Applicable).  If you need a refill on your cardiac medications before your next appointment, please call your pharmacy.

## 2017-07-12 NOTE — Progress Notes (Signed)
PCP: [name], MD Primary Cardiologist: Dr Bronson Ing Primary EP: Dr Rayann Heman  Austin Woods is a 52 y.o. male who presents today for routine electrophysiology followup.  Since last being seen in our clinic, the patient reports doing very well.  His primary concern today is slow healing after recent inguinal hernia repair.  Otherwise doing well.  Today, he denies symptoms of palpitations, chest pain, shortness of breath,  lower extremity edema, dizziness, presyncope, or syncope.  The patient is otherwise without complaint today.   Past Medical History:  Diagnosis Date  . A-fib ([place])   . Arthritis   . Asthma   . Coronary artery disease   . Dysrhythmia   . GERD (gastroesophageal reflux disease)   . Headache   . High cholesterol   . History of DVT (deep vein thrombosis)    a. left leg following ablation for SVT  . Hypertension   . Persistent atrial fibrillation ([place])    a. Dx 08/2012, xarelto initiated; b. 09/2012 Tikosyn initiated -> DCCV -> Sinus c. PVI 03/2013 d. PVI 03/2014  . Sleep apnea    USES  CPAP   . Wolff-Parkinson-White (WPW) syndrome    a. s/p RFCA 2001 by Dr Caryl Comes, pt reports complicated by post procedure DVT   Past Surgical History:  Procedure Laterality Date  . ATRIAL FIBRILLATION ABLATION  03/23/2013   PVI by DR Rayann Heman for afib  . ATRIAL FIBRILLATION ABLATION N/A 03/23/2013   Procedure: ATRIAL FIBRILLATION ABLATION;  Surgeon: Coralyn Mark, MD;  Location: [facility] CATH LAB;  Service: Cardiovascular;  Laterality: N/A;  . ATRIAL FIBRILLATION ABLATION N/A 03/27/2014   PVI Dr Rayann Heman  . BACK SURGERY    . CARDIAC CATHETERIZATION N/A 02/03/2016   Procedure: Left Heart Cath and Coronary Angiography;  Surgeon: Nelva Bush, MD;  Location: [place] CV LAB;  Service: Cardiovascular;  Laterality: N/A;  . CARDIAC CATHETERIZATION N/A 02/03/2016   Procedure: Intravascular Pressure Wire/FFR Study;  Surgeon: Nelva Bush, MD;  Location: [place] CV LAB;  Service:  Cardiovascular;  Laterality: N/A;  . CARDIAC CATHETERIZATION N/A 02/03/2016   Procedure: Coronary Stent Intervention;  Surgeon: Nelva Bush, MD;  Location: [place] CV LAB;  Service: Cardiovascular;  Laterality: N/A;  . CARDIAC SURGERY    . CARDIOVERSION N/A 08/25/2012   Procedure: CARDIOVERSION;  Surgeon: Thayer Headings, MD;  Location: [facility] ENDOSCOPY;  Service: Cardiovascular;  Laterality: N/A;  . CARDIOVERSION N/A 09/06/2012   Procedure: CARDIOVERSION/Bedside;  Surgeon: Carlena Bjornstad, MD;  Location: [place];  Service: Cardiovascular;  Laterality: N/A;  . CARDIOVERSION N/A 01/12/2014   Procedure: CARDIOVERSION;  Surgeon: Pixie Casino, MD;  Location: [facility] ENDOSCOPY;  Service: Cardiovascular;  Laterality: N/A;  . CARDIOVERSION N/A 01/30/2016   Procedure: CARDIOVERSION;  Surgeon: Sueanne Margarita, MD;  Location: [facility] ENDOSCOPY;  Service: Cardiovascular;  Laterality: N/A;  . CARDIOVERSION N/A 04/14/2016   Procedure: CARDIOVERSION;  Surgeon: Pixie Casino, MD;  Location: [facility] ENDOSCOPY;  Service: Cardiovascular;  Laterality: N/A;  . COLONOSCOPY N/A 12/04/2015   Procedure: COLONOSCOPY;  Surgeon: Rogene Houston, MD;  Location: [facility] ENDO [facility];  Service: Endoscopy;  Laterality: N/A;  730  . CORONARY ANGIOPLASTY    . CORONARY ANGIOPLASTY WITH STENT PLACEMENT    . ELECTROPHYSIOLOGIC STUDY N/A 02/18/2016   Procedure: Atrial Fibrillation Ablation;  Surgeon: Thompson Grayer, MD;  Location: [place] CV LAB;  Service: Cardiovascular;  Laterality: N/A;  . INGUINAL HERNIA REPAIR Bilateral 06/04/2017   Procedure: OPEN BILATERAL INGUINAL HERNIA  REPAIR;  Surgeon: Judeth Horn, MD;  Location: [place];  Service: General;  Laterality: Bilateral;  . LOOP RECORDER IMPLANT N/A 06/29/2014   Procedure: LOOP RECORDER IMPLANT;  Surgeon: Thompson Grayer, MD;  Location: [facility] CATH LAB;  Service: Cardiovascular;  Laterality: N/A;  . NOSE SURGERY     sleep apnea surgery  . PERIPHERAL VASCULAR CATHETERIZATION N/A 03/13/2016   Procedure:  Thrombin Injection;  Surgeon: Serafina Mitchell, MD;  Location: [place] CV LAB;  Service: Cardiovascular;  Laterality: N/A;  . STOMACH SURGERY     reflux, fundoplication  . TEE WITHOUT CARDIOVERSION N/A 08/25/2012   Procedure: TRANSESOPHAGEAL ECHOCARDIOGRAM (TEE);  Surgeon: Thayer Headings, MD;  Location: [place];  Service: Cardiovascular;  Laterality: N/A;  . TEE WITHOUT CARDIOVERSION  03/23/2013   DR ROSS  . TEE WITHOUT CARDIOVERSION N/A 03/23/2013   Procedure: TRANSESOPHAGEAL ECHOCARDIOGRAM (TEE);  Surgeon: Fay Records, MD;  Location: [facility] ENDOSCOPY;  Service: Cardiovascular;  Laterality: N/A;  . TEE WITHOUT CARDIOVERSION N/A 03/26/2014   Procedure: TRANSESOPHAGEAL ECHOCARDIOGRAM (TEE);  Surgeon: Josue Hector, MD;  Location: [facility] ENDOSCOPY;  Service: Cardiovascular;  Laterality: N/A;  . TONSILLECTOMY      ROS- all systems are reviewed and negatives except as per HPI above  Current Outpatient Medications  Medication Sig Dispense Refill  . acetaminophen (TYLENOL) 500 MG tablet Take 1,000 mg by mouth every 6 (six) hours as needed for headache.    . albuterol (PROVENTIL HFA) 108 (90 Base) MCG/ACT inhaler Inhale 2 puffs into the lungs every 4 (four) hours as needed for wheezing or shortness of breath. 1 Inhaler 0  . atorvastatin (LIPITOR) 10 MG tablet Take 1 tablet (10 mg total) by mouth daily. 30 tablet 0  . Cholecalciferol (VITAMIN D) 2000 UNITS CAPS Take 2,000 Units by mouth daily.     Marland Kitchen dofetilide (TIKOSYN) 500 MCG capsule Take 1 capsule (500 mcg total) by mouth 2 (two) times daily. 180 capsule 3  . dofetilide (TIKOSYN) 500 MCG capsule TAKE 1 CAPSULE BY MOUTH TWO TIMES DAILY (Patient taking differently: TAKE 1 CAPSULE (500mg )  BY MOUTH TWO TIMES DAILY) 180 capsule 3  . enalapril (VASOTEC) 10 MG tablet Take 1 tablet (10 mg total) by mouth 2 (two) times daily. 180 tablet 3  . Fluticasone-Salmeterol (ADVAIR DISKUS) 250-50 MCG/DOSE AEPB Inhale 1 puff into the lungs every 12 (twelve)  hours. 60 each 0  . furosemide (LASIX) 40 MG tablet TAKE 1 TABLET BY MOUTH  DAILY (Patient taking differently: TAKE 1 TABLET (40mg )  BY MOUTH  DAILY) 7 tablet 0  . gabapentin (NEURONTIN) 300 MG capsule Take 1 capsule by mouth 2 (two) times daily.  1  . hydrOXYzine (VISTARIL) 25 MG capsule Take 2 capsules (50 mg total) by mouth 3 (three) times daily as needed for anxiety. 30 capsule 0  . ketoconazole (NIZORAL) 2 % cream Apply 1 application topically 2 (two) times daily as needed for irritation.    Marland Kitchen MAGNESIUM PO Take 500 mg by mouth daily.     . metoprolol succinate (TOPROL-XL) 100 MG 24 hr tablet Take 50 mg by mouth daily 45 tablet 3  . nitroGLYCERIN (NITROSTAT) 0.4 MG SL tablet DISSOLVE 1 TABLET ON THE  TONGUE EVERY 5 MINUTES UP  TO 3 DOSES AS NEEDED FOR  CHEST PAIN (CALL 911 IF  CHEST PAIN PERSISTS) 75 tablet 3  . oxybutynin (DITROPAN) 5 MG tablet TAKE ONE-HALF TABLET BY  MOUTH TWO TIMES DAILY AS  NEEDED FOR BLADDER SPASMS (Patient taking differently:  TAKE ONE-HALF TABLET (2.5mg ) BY  MOUTH TWO TIMES DAILY AS  NEEDED FOR BLADDER SPASMS) 30 tablet 0  . pantoprazole (PROTONIX) 40 MG tablet TAKE 1 TABLET BY MOUTH  DAILY (Patient taking differently: TAKE 1 TABLET (40mg ) BY MOUTH  DAILY) 90 tablet 2  . potassium chloride SA (K-DUR,KLOR-CON) 20 MEQ tablet TAKE 1 TABLET BY MOUTH  EVERY DAY (Patient taking differently: TAKE 1 TABLET (20mg ) BY MOUTH  EVERY DAY) 30 tablet 0  . predniSONE (DELTASONE) 20 MG tablet Take three tabs for 3 days; then 2 tabs for 3 days; and one for 3 days 18 tablet 0  . PROCTO-MED HC 2.5 % rectal cream APPLY 3 TIMES DAILY AS  DIRECTED 90 g 0  . tiZANidine (ZANAFLEX) 4 MG capsule Take one three times a day as needed for spasms 30 capsule 0   No current facility-administered medications for this visit.     Physical Exam: Vitals:   07/12/17 1134  BP: 130/80  Pulse: 76  SpO2: 96%  Weight: 272 lb (123.4 kg)  Height: 6\' 1"  (1.854 m)    GEN- The patient is well appearing, alert  and oriented x 3 today.   Head- normocephalic, atraumatic Eyes-  Sclera clear, conjunctiva pink Ears- hearing intact Oropharynx- clear Lungs- Clear to ausculation bilaterally, normal work of breathing Heart- Regular rate and rhythm, no murmurs, rubs or gallops, PMI not laterally displaced GI- soft, NT, ND, + BS Extremities- no clubbing, cyanosis, or edema  EKG tracing ordered today is personally reviewed and shows sinus rhythm 73 bpm, PR 142 msec, Qtc 491 msec, incomplete RBBB  Assessment and Plan:  1. Persistent afib S/p PVI x 3 Doing great post ablation with well controlled AF He is reluctant to stop tikosyn. ILR reveals afib burden 0.9 %.  Reviewed episodes are actually sinus with PACs, not afib No changes Bmet, mg today chads2vasc score is 2  2. CAD No ischemic symptoms No changes  3. OSA Compliance with CPAP encouraged  4. Obesity Body mass index is 35.89 kg/m. Lifestyle modification encouraged  5. HTN Stable No change required today  [facility] Return to see AF clinic in 6 months I will see in a year  Thompson Grayer MD, [facility] 07/12/2017 11:56 AM

## 2017-07-13 DIAGNOSIS — E119 Type 2 diabetes mellitus without complications: Secondary | ICD-10-CM | POA: Diagnosis not present

## 2017-07-13 DIAGNOSIS — E039 Hypothyroidism, unspecified: Secondary | ICD-10-CM | POA: Diagnosis not present

## 2017-07-13 LAB — BASIC METABOLIC PANEL
BUN / CREAT RATIO: 13 (ref 9–20)
BUN: 15 mg/dL (ref 6–24)
CALCIUM: 9.6 mg/dL (ref 8.7–10.2)
CHLORIDE: 99 mmol/L (ref 96–106)
CO2: 28 mmol/L (ref 20–29)
Creatinine, Ser: 1.13 mg/dL (ref 0.76–1.27)
GFR, EST AFRICAN AMERICAN: 85 mL/min/{1.73_m2} (ref >59)
GFR, EST NON AFRICAN AMERICAN: 73 mL/min/{1.73_m2} (ref >59)
Glucose: 122 mg/dL — ABNORMAL HIGH (ref 65–99)
POTASSIUM: 5 mmol/L (ref 3.5–5.2)
SODIUM: 141 mmol/L (ref 134–144)

## 2017-07-13 LAB — MAGNESIUM: Magnesium: 2.1 mg/dL (ref 1.6–2.3)

## 2017-07-19 ENCOUNTER — Ambulatory Visit (INDEPENDENT_AMBULATORY_CARE_PROVIDER_SITE_OTHER): Payer: BLUE CROSS/BLUE SHIELD | Admitting: *Deleted

## 2017-07-19 DIAGNOSIS — I4819 Other persistent atrial fibrillation: Secondary | ICD-10-CM

## 2017-07-19 DIAGNOSIS — I481 Persistent atrial fibrillation: Secondary | ICD-10-CM | POA: Diagnosis not present

## 2017-07-20 NOTE — Progress Notes (Signed)
Carelink Summary Report / Loop Recorder 

## 2017-07-21 ENCOUNTER — Encounter: Payer: BLUE CROSS/BLUE SHIELD | Admitting: Family Medicine

## 2017-07-21 DIAGNOSIS — Z029 Encounter for administrative examinations, unspecified: Secondary | ICD-10-CM

## 2017-07-26 LAB — CUP PACEART REMOTE DEVICE CHECK
Date Time Interrogation Session: 20190314114054
MDC IDC PG IMPLANT DT: 20160325

## 2017-07-27 ENCOUNTER — Encounter: Payer: Self-pay | Admitting: Family Medicine

## 2017-07-29 ENCOUNTER — Ambulatory Visit (INDEPENDENT_AMBULATORY_CARE_PROVIDER_SITE_OTHER): Payer: BLUE CROSS/BLUE SHIELD | Admitting: Family Medicine

## 2017-07-29 ENCOUNTER — Encounter: Payer: Self-pay | Admitting: Family Medicine

## 2017-07-29 ENCOUNTER — Telehealth: Payer: Self-pay | Admitting: Family Medicine

## 2017-07-29 VITALS — BP 138/88 | Ht 73.0 in | Wt 280.0 lb

## 2017-07-29 DIAGNOSIS — M25511 Pain in right shoulder: Secondary | ICD-10-CM | POA: Diagnosis not present

## 2017-07-29 DIAGNOSIS — G4733 Obstructive sleep apnea (adult) (pediatric): Secondary | ICD-10-CM

## 2017-07-29 DIAGNOSIS — I10 Essential (primary) hypertension: Secondary | ICD-10-CM | POA: Diagnosis not present

## 2017-07-29 DIAGNOSIS — Z0001 Encounter for general adult medical examination with abnormal findings: Secondary | ICD-10-CM

## 2017-07-29 DIAGNOSIS — E7800 Pure hypercholesterolemia, unspecified: Secondary | ICD-10-CM

## 2017-07-29 DIAGNOSIS — Z0000 Encounter for general adult medical examination without abnormal findings: Secondary | ICD-10-CM | POA: Diagnosis not present

## 2017-07-29 MED ORDER — POTASSIUM CHLORIDE CRYS ER 20 MEQ PO TBCR: EXTENDED_RELEASE_TABLET | ORAL | 5 refills | Status: DC

## 2017-07-29 MED ORDER — FLUTICASONE-SALMETEROL 250-50 MCG/DOSE IN AEPB: 1.0000 | INHALATION_SPRAY | Freq: Two times a day (BID) | RESPIRATORY_TRACT | 0 refills | Status: AC

## 2017-07-29 MED ORDER — OXYBUTYNIN CHLORIDE 5 MG PO TABS: ORAL_TABLET | ORAL | 5 refills | Status: AC

## 2017-07-29 MED ORDER — ALBUTEROL SULFATE HFA 108 (90 BASE) MCG/ACT IN AERS: 2.0000 | INHALATION_SPRAY | RESPIRATORY_TRACT | 0 refills | Status: DC | PRN

## 2017-07-29 MED ORDER — ATORVASTATIN CALCIUM 10 MG PO TABS: 10.0000 mg | ORAL_TABLET | Freq: Every day | ORAL | 5 refills | Status: DC

## 2017-07-29 MED ORDER — PANTOPRAZOLE SODIUM 40 MG PO TBEC: DELAYED_RELEASE_TABLET | ORAL | 2 refills | Status: DC

## 2017-07-29 NOTE — Telephone Encounter (Signed)
Please advise if you need any thing

## 2017-07-29 NOTE — Progress Notes (Addendum)
   Subjective:    Patient ID: Austin Woods, male    DOB: 01-29-1966, 52 y.o.   MRN: [id number]  HPI The patient comes in today for a wellness visit.    A review of their health history was completed.  A review of medications was also completed.  Any needed refills; on all chronic meds  Eating habits: health conscious  Falls/  MVA accidents in past few months: no  Regular exercise: not at this time due to hernia surgery  Specialist pt sees on regular basis: cardiologist  Preventative health issues were discussed.   Additional concerns:  Right shoulder is still bothering him0, still feels tight, can move better, still hurting   Patient continues to take lipid medication regularly. No obvious side effects from it. Generally does not miss a dose. Prior blood work results are reviewed with patient. Patient continues to work on fat intake in diet  Chronic breathing a challenge tho btter. Sticks with inhaler    Review of Systems  Constitutional: Negative for activity change, appetite change and fever.  HENT: Negative for congestion and rhinorrhea.   Eyes: Negative for discharge.  Respiratory: Negative for cough and wheezing.   Cardiovascular: Negative for chest pain.  Gastrointestinal: Negative for abdominal pain, blood in stool and vomiting.  Genitourinary: Negative for difficulty urinating and frequency.  Musculoskeletal: Negative for neck pain.  Skin: Negative for rash.  Allergic/Immunologic: Negative for environmental allergies and food allergies.  Neurological: Negative for weakness and headaches.  Psychiatric/Behavioral: Negative for agitation.  All other systems reviewed and are negative.      Objective:   Physical Exam  Constitutional: He appears well-developed and well-nourished.  HENT:  Head: Normocephalic and atraumatic.  Right Ear: External ear normal.  Left Ear: External ear normal.  Nose: Nose normal.  Mouth/Throat: Oropharynx is clear and moist.    Eyes: Right eye exhibits no discharge. Left eye exhibits no discharge. No scleral icterus.  Neck: Normal range of motion. Neck supple. No thyromegaly present.  Cardiovascular: Normal rate, regular rhythm and normal heart sounds.  No murmur heard. Pulmonary/Chest: Effort normal and breath sounds normal. No respiratory distress. He has no wheezes.  Abdominal: Soft. Bowel sounds are normal. He exhibits no distension and no mass. There is no tenderness.  Genitourinary: Penis normal.  Musculoskeletal: Normal range of motion. He exhibits no edema.  Lymphadenopathy:    He has no cervical adenopathy.  Neurological: He is alert. He exhibits normal muscle tone. Coordination normal.  Skin: Skin is warm and dry. No erythema.  Psychiatric: He has a normal mood and affect. His behavior is normal. Judgment normal.  Vitals reviewed.         Assessment & Plan:  1  impression wellness exam obesity discussed.  Diet discussed.  Exercise discussed  2.  Hyperlipidemia.  Prior blood results discussed.  Compliance with medication discussed medications refilled.  3.  Hypertension clinically stable discussed maintain  4.  Obstructive sleep apnea.  Confirmed by sleep study several years ago.  Patient uses his CPAP faithfully at night.  Wears throughout the night.  Notes that it substantially helps his sleep, and substantially improves his energy level during the day.  Patient to maintain CPAP  Wellness compliance.    Colonoscopy.  Up-to-date  Appropriate blood work.  Diet discussed.  Exercise discussed.  Medications refilled.  Follow-up in 6 months.

## 2017-07-29 NOTE — Telephone Encounter (Signed)
Patient is needing Rx for new c-pap machine and equipment.  [employer]

## 2017-07-30 LAB — HEPATIC FUNCTION PANEL
ALBUMIN: 4.5 g/dL (ref 3.5–5.5)
ALT: 22 IU/L (ref 0–44)
AST: 20 IU/L (ref 0–40)
Alkaline Phosphatase: 58 IU/L (ref 39–117)
Bilirubin Total: 0.6 mg/dL (ref 0.0–1.2)
Bilirubin, Direct: 0.19 mg/dL (ref 0.00–0.40)
Total Protein: 6.6 g/dL (ref 6.0–8.5)

## 2017-07-30 LAB — LIPID PANEL
CHOLESTEROL TOTAL: 180 mg/dL (ref 100–199)
Chol/HDL Ratio: 4.7 ratio (ref 0.0–5.0)
HDL: 38 mg/dL — ABNORMAL LOW (ref >39)
LDL CALC: 98 mg/dL (ref 0–99)
Triglycerides: 220 mg/dL — ABNORMAL HIGH (ref 0–149)
VLDL Cholesterol Cal: 44 mg/dL — ABNORMAL HIGH (ref 5–40)

## 2017-07-30 LAB — BASIC METABOLIC PANEL
BUN/Creatinine Ratio: 11 (ref 9–20)
BUN: 12 mg/dL (ref 6–24)
CALCIUM: 9.1 mg/dL (ref 8.7–10.2)
CO2: 24 mmol/L (ref 20–29)
Chloride: 105 mmol/L (ref 96–106)
Creatinine, Ser: 1.08 mg/dL (ref 0.76–1.27)
GFR, EST AFRICAN AMERICAN: 89 mL/min/{1.73_m2} (ref >59)
GFR, EST NON AFRICAN AMERICAN: 77 mL/min/{1.73_m2} (ref >59)
Glucose: 112 mg/dL — ABNORMAL HIGH (ref 65–99)
POTASSIUM: 4.4 mmol/L (ref 3.5–5.2)
Sodium: 143 mmol/L (ref 134–144)

## 2017-07-30 LAB — PSA: Prostate Specific Ag, Serum: 0.3 ng/mL (ref 0.0–4.0)

## 2017-08-06 ENCOUNTER — Encounter: Payer: Self-pay | Admitting: Family Medicine

## 2017-08-06 ENCOUNTER — Telehealth: Payer: Self-pay | Admitting: Family Medicine

## 2017-08-06 NOTE — Telephone Encounter (Signed)
Please do addendum to OV 07/29/17 documenting pt's use & benefit of CPAP (insurance requirement) so I may send with order for replacement machine  Dx of sleep apnea is attached to that OV but nothing documented in note    Please advise

## 2017-08-06 NOTE — Telephone Encounter (Signed)
Message sent to Dr

## 2017-08-06 NOTE — Telephone Encounter (Signed)
Message sent to Dr. Richardson Landry, please do addendum to 07/29/17 OV  Spoke with pt, he would like an Auto CPAP  Will write order & send to Dr. Richardson Landry to sign

## 2017-08-06 NOTE — Telephone Encounter (Signed)
Please sign order for replacement CPAP so that I may fax with all required documentation   In green folder in yellow box

## 2017-08-08 ENCOUNTER — Telehealth: Payer: Self-pay | Admitting: Family Medicine

## 2017-08-08 ENCOUNTER — Other Ambulatory Visit: Payer: Self-pay | Admitting: Internal Medicine

## 2017-08-09 ENCOUNTER — Other Ambulatory Visit: Payer: Self-pay | Admitting: Cardiovascular Disease

## 2017-08-09 NOTE — Telephone Encounter (Signed)
Ok done

## 2017-08-09 NOTE — Telephone Encounter (Signed)
Dr. Richardson Landry please see below  Documentation needed so that I may send with replacement CPAP order   Please do addendum to [place] 07/29/17 documenting pt's use & benefit of CPAP (insurance requirement) so I may send with order for replacement machine  Dx of sleep apnea is attached to that OV but nothing documented in note    Please advise

## 2017-08-09 NOTE — Telephone Encounter (Signed)
Received signed order for replacement CPAP

## 2017-08-09 NOTE — Telephone Encounter (Signed)
Addendum comopleted notify [place]

## 2017-08-10 ENCOUNTER — Other Ambulatory Visit: Payer: Self-pay | Admitting: Internal Medicine

## 2017-08-10 NOTE — Telephone Encounter (Signed)
Faxed replacement CPAP order & required documentation to [place]  Auto CPAP with heated humidifier, mask/nasal pillows of choice, tubing, filter, & supplies  Pressure 4-20 cmH2O  Filed

## 2017-08-13 ENCOUNTER — Telehealth: Payer: Self-pay | Admitting: Family Medicine

## 2017-08-13 ENCOUNTER — Other Ambulatory Visit: Payer: Self-pay

## 2017-08-13 MED ORDER — FUROSEMIDE 40 MG PO TABS: ORAL_TABLET | ORAL | 1 refills | Status: DC

## 2017-08-13 NOTE — Telephone Encounter (Signed)
[facility] calling to tell us patient's furosemide (LASIX) 40 MG tablet needs prior authorization. Michela Pitcher they would be faxing over something. Pharm # [phone]  Call ref# [id number]

## 2017-08-13 NOTE — Telephone Encounter (Signed)
Reordered meds

## 2017-08-18 ENCOUNTER — Encounter: Payer: BLUE CROSS/BLUE SHIELD | Admitting: *Deleted

## 2017-08-23 ENCOUNTER — Ambulatory Visit (INDEPENDENT_AMBULATORY_CARE_PROVIDER_SITE_OTHER): Payer: BLUE CROSS/BLUE SHIELD | Admitting: *Deleted

## 2017-08-23 DIAGNOSIS — I481 Persistent atrial fibrillation: Secondary | ICD-10-CM

## 2017-08-23 DIAGNOSIS — I4819 Other persistent atrial fibrillation: Secondary | ICD-10-CM

## 2017-08-23 LAB — CUP PACEART REMOTE DEVICE CHECK
Implantable Pulse Generator Implant Date: 20160325
MDC IDC SESS DTM: 20190416120624

## 2017-08-24 NOTE — Progress Notes (Signed)
Carelink Summary Report / Loop Recorder 

## 2017-09-14 LAB — CUP PACEART REMOTE DEVICE CHECK
Date Time Interrogation Session: 20190519121011
MDC IDC PG IMPLANT DT: 20160325

## 2017-09-24 ENCOUNTER — Ambulatory Visit (INDEPENDENT_AMBULATORY_CARE_PROVIDER_SITE_OTHER): Payer: BLUE CROSS/BLUE SHIELD | Admitting: *Deleted

## 2017-09-24 DIAGNOSIS — I481 Persistent atrial fibrillation: Secondary | ICD-10-CM | POA: Diagnosis not present

## 2017-09-24 DIAGNOSIS — I4819 Other persistent atrial fibrillation: Secondary | ICD-10-CM

## 2017-09-24 DIAGNOSIS — I5022 Chronic systolic (congestive) heart failure: Secondary | ICD-10-CM

## 2017-09-24 NOTE — Progress Notes (Signed)
Carelink Summary Report / Loop Recorder 

## 2017-09-28 ENCOUNTER — Telehealth: Payer: Self-pay | Admitting: *Deleted

## 2017-09-28 NOTE — Telephone Encounter (Signed)
Fax from [employer] requesting refill on neurontin 300mg  one bid. Prescribed by DR. Judeth Horn. Pt last seen here 07/29/17.

## 2017-09-28 NOTE — Telephone Encounter (Signed)
Left message to return call 

## 2017-09-28 NOTE — Telephone Encounter (Signed)
Good, therefore do not refill

## 2017-09-28 NOTE — Telephone Encounter (Signed)
Prescribed after hernia surgery but pt states he does not feel any difference when he takes it or not and wants to come off of med anyway since it doesn't seem to help. He said he would call back if he changes his mind.

## 2017-09-28 NOTE — Telephone Encounter (Signed)
Call pt see why he takes ,  what is his intention going gforward with the med?

## 2017-09-30 ENCOUNTER — Other Ambulatory Visit: Payer: Self-pay | Admitting: *Deleted

## 2017-09-30 MED ORDER — GABAPENTIN 300 MG PO CAPS: 300.0000 mg | ORAL_CAPSULE | Freq: Two times a day (BID) | ORAL | 2 refills | Status: DC

## 2017-09-30 NOTE — Progress Notes (Signed)
This patient may have 1 refill with 2 additional refills but needs follow-up office visit with Dr. Richardson Landry by fall time

## 2017-09-30 NOTE — Progress Notes (Signed)
Prescription sent electronically to pharmacy. Patient had follow up office visit scheduled in October.

## 2017-10-14 ENCOUNTER — Other Ambulatory Visit: Payer: Self-pay | Admitting: *Deleted

## 2017-10-14 MED ORDER — FUROSEMIDE 40 MG PO TABS: ORAL_TABLET | ORAL | 0 refills | Status: DC

## 2017-10-15 ENCOUNTER — Telehealth: Payer: Self-pay | Admitting: *Deleted

## 2017-10-15 NOTE — Telephone Encounter (Signed)
Spoke w/ pt about his ILR reaching RRT. Informed him of his options. Pt stated that he wanted to leave the loop recorder in place. Informed pt that I would order him a return kit to send his home monitor back to [employer]. Pt verbalized understanding.

## 2017-10-15 NOTE — Telephone Encounter (Signed)
LMOVM requesting call back to the [facility].  Gave direct number for return call.  LINQ at RRT as of 10/13/17.

## 2017-10-25 ENCOUNTER — Other Ambulatory Visit: Payer: Self-pay | Admitting: *Deleted

## 2017-10-25 ENCOUNTER — Telehealth: Payer: Self-pay

## 2017-10-25 MED ORDER — FUROSEMIDE 40 MG PO TABS: ORAL_TABLET | ORAL | 0 refills | Status: DC

## 2017-10-25 NOTE — Telephone Encounter (Signed)
Patient pharmacy sent a notice that pt has been prescribed Furosemide 40 mg and it may interact with Dofetilide 500 mcg. Taking both of these together can cause dofetilide-induced torsade de pointes.This form is in your box.

## 2017-10-26 ENCOUNTER — Other Ambulatory Visit: Payer: Self-pay | Admitting: Internal Medicine

## 2017-10-28 ENCOUNTER — Encounter: Payer: Self-pay | Admitting: Nurse Practitioner

## 2017-10-28 ENCOUNTER — Ambulatory Visit: Payer: BLUE CROSS/BLUE SHIELD | Admitting: Nurse Practitioner

## 2017-10-28 VITALS — BP 102/74 | Temp 98.8°F | Ht 73.0 in | Wt 276.0 lb

## 2017-10-28 DIAGNOSIS — J019 Acute sinusitis, unspecified: Secondary | ICD-10-CM

## 2017-10-28 DIAGNOSIS — B9689 Other specified bacterial agents as the cause of diseases classified elsewhere: Secondary | ICD-10-CM

## 2017-10-28 DIAGNOSIS — R1909 Other intra-abdominal and pelvic swelling, mass and lump: Secondary | ICD-10-CM

## 2017-10-28 LAB — CUP PACEART REMOTE DEVICE CHECK
Implantable Pulse Generator Implant Date: 20160325
MDC IDC SESS DTM: 20190621120822

## 2017-10-28 MED ORDER — AMOXICILLIN-POT CLAVULANATE 875-125 MG PO TABS: 1.0000 | ORAL_TABLET | Freq: Two times a day (BID) | ORAL | 0 refills | Status: DC

## 2017-10-28 MED ORDER — PREDNISONE 20 MG PO TABS: ORAL_TABLET | ORAL | 0 refills | Status: DC

## 2017-10-29 ENCOUNTER — Encounter: Payer: Self-pay | Admitting: Nurse Practitioner

## 2017-10-29 NOTE — Telephone Encounter (Signed)
ok 

## 2017-10-29 NOTE — Progress Notes (Signed)
Subjective: Presents for complaints of sinus symptoms that began 3 days ago.  Frontal area headache.  Left side of his sinuses a very stopped up.  Occasional cough mainly due to postnasal drainage, mild chest pain with prolonged cough.  Has had a flareup of his wheezing, using his albuterol inhaler about twice a day.  Worse when he is exposed to heat and humidity.  No fever sore throat or ear pain.  Patient had hernia surgery in March.  Had a knot in the right groin area at that time.  States his surgeon checked it out at that point.  Area seemed to improve but has come back.  Has gotten smaller in size over the past few days.  Tender to palpation.  Objective:   BP 102/74   Temp 98.8 F (37.1 C) (Oral)   Ht 6\' 1"  (1.854 m)   Wt 276 lb 0.4 oz (125.2 kg)   BMI 36.42 kg/m  NAD.  Alert, oriented.  TMs mild clear effusion, no erythema.  Pharynx injected with PND noted.  Neck supple with mild soft anterior adenopathy.  Lungs clear.  Heart regular rate rhythm.  A rubbery deep cystic area noted in the right groin area adjacent to the intertriginous area.  Mildly tender to palpation.  Assessment:  Acute bacterial rhinosinusitis  Right groin mass - Plan: Ambulatory referral to General Surgery    Plan:   Meds ordered this encounter  Medications  . predniSONE (DELTASONE) 20 MG tablet    Sig: 3 po qd x 3 d then 2 po qd x 3 d then 1 po qd x 2 d    Dispense:  17 tablet    Refill:  0    Order Specific Question:   Supervising Provider    Answer:   [name] [[id number]]  . amoxicillin-clavulanate (AUGMENTIN) 875-125 MG tablet    Sig: Take 1 tablet by mouth 2 (two) times daily.    Dispense:  20 tablet    Refill:  0    Order Specific Question:   Supervising Provider    Answer:   [name] [[id number]]   Call back if sinus symptoms worsen or persist.  Will refer back to surgeon for reevaluation of mass in his groin area.  Warning signs reviewed.  Patient to call back sooner if any problems.

## 2017-10-29 NOTE — Telephone Encounter (Signed)
Contacted patient to inform him of this interaction and patient states that he will bring this up to his heart doctor at next visit. Pt stated that he feel fine when he takes his meds.

## 2017-10-29 NOTE — Telephone Encounter (Signed)
We refilled lasix at patients request , this concern will need to be brought up to card who prescribes this combination in the first place

## 2017-11-01 ENCOUNTER — Telehealth: Payer: Self-pay | Admitting: *Deleted

## 2017-11-01 NOTE — Telephone Encounter (Signed)
Patient seen recently by Dr.Allred recently. I will send a message to him regarding this  Dr Andrew Au patient is on  Tikosyn and is also on furosemide for pedal edema.  His drug benefit manager company is stating that the furosemide can increase his risk of heart arrhythmia.  Is this an issue that should affect prescribing the furosemide?  Would Demadex be any better?  If you could give your opinion regarding this that would greatly help me thank you- [place]

## 2017-11-01 NOTE — Telephone Encounter (Signed)
What type of interaction issue is it?  If it is not serious we will stick with the original orders

## 2017-11-01 NOTE — Telephone Encounter (Signed)
There is no real interaction or issue with lasix.  The concern is with lasix' effects on electrolytes and renal function.  We just need to follow these as we routinely do (every 6 months).  demadex would like be worse.  No change advised.  Thanks! Jeneen Rinks

## 2017-11-01 NOTE — Telephone Encounter (Signed)
[facility] calling because there is an interaction between furosemide (prescribed by Dr. Richardson Landry) and dofetlilide 500mg  ( prescribed by Dr. Jacinta Shoe).   Call back [phone] reference # [id number]

## 2017-11-01 NOTE — Telephone Encounter (Signed)
Risk of irregular heart rythym

## 2017-11-02 NOTE — Telephone Encounter (Signed)
Pt returned call and verbalized understanding  

## 2017-11-02 NOTE — Telephone Encounter (Signed)
Spoke with pharmacy and informed pharmacy that it is fine for patient to be on both meds. Left message for patient to return call.

## 2017-11-02 NOTE — Telephone Encounter (Signed)
Nurses-please let the pharmacy people know that I did communicate with his cardiologist who stated it would be fine for him to be on both of these medicines that we follow his electrolytes on a regular basis.  Please either call or send notification to the patient reminding him that before his office visit in October we would like for him to connect with [place] for lab ordersTo check his kidney functions as well as other labs

## 2017-11-03 ENCOUNTER — Encounter (INDEPENDENT_AMBULATORY_CARE_PROVIDER_SITE_OTHER): Payer: Self-pay

## 2017-11-03 ENCOUNTER — Encounter: Payer: Self-pay | Admitting: Family Medicine

## 2017-11-09 DIAGNOSIS — B3742 Candidal balanitis: Secondary | ICD-10-CM | POA: Diagnosis not present

## 2017-11-24 ENCOUNTER — Other Ambulatory Visit: Payer: Self-pay | Admitting: Internal Medicine

## 2017-11-30 DIAGNOSIS — E559 Vitamin D deficiency, unspecified: Secondary | ICD-10-CM | POA: Diagnosis not present

## 2017-11-30 DIAGNOSIS — Z139 Encounter for screening, unspecified: Secondary | ICD-10-CM | POA: Diagnosis not present

## 2017-11-30 DIAGNOSIS — Z0130 Encounter for examination of blood pressure without abnormal findings: Secondary | ICD-10-CM | POA: Diagnosis not present

## 2017-11-30 DIAGNOSIS — E785 Hyperlipidemia, unspecified: Secondary | ICD-10-CM | POA: Diagnosis not present

## 2017-11-30 DIAGNOSIS — R7301 Impaired fasting glucose: Secondary | ICD-10-CM | POA: Diagnosis not present

## 2017-11-30 DIAGNOSIS — Z0000 Encounter for general adult medical examination without abnormal findings: Secondary | ICD-10-CM | POA: Diagnosis not present

## 2017-12-13 ENCOUNTER — Encounter: Payer: Self-pay | Admitting: Family Medicine

## 2017-12-13 ENCOUNTER — Other Ambulatory Visit (HOSPITAL_COMMUNITY)
Admission: RE | Admit: 2017-12-13 | Discharge: 2017-12-13 | Disposition: A | Discharge status: Home / Self Care | Payer: BLUE CROSS/BLUE SHIELD | Source: Ambulatory Visit | Attending: Family Medicine | Admitting: Family Medicine

## 2017-12-13 ENCOUNTER — Ambulatory Visit: Payer: BLUE CROSS/BLUE SHIELD | Admitting: Family Medicine

## 2017-12-13 VITALS — BP 118/80 | Temp 98.6°F | Ht 73.0 in | Wt 272.0 lb

## 2017-12-13 DIAGNOSIS — K5792 Diverticulitis of intestine, part unspecified, without perforation or abscess without bleeding: Secondary | ICD-10-CM | POA: Diagnosis not present

## 2017-12-13 DIAGNOSIS — J019 Acute sinusitis, unspecified: Secondary | ICD-10-CM | POA: Diagnosis not present

## 2017-12-13 DIAGNOSIS — R109 Unspecified abdominal pain: Secondary | ICD-10-CM | POA: Insufficient documentation

## 2017-12-13 DIAGNOSIS — B9689 Other specified bacterial agents as the cause of diseases classified elsewhere: Secondary | ICD-10-CM | POA: Diagnosis not present

## 2017-12-13 LAB — BASIC METABOLIC PANEL
Anion gap: 9 (ref 5–15)
BUN: 13 mg/dL (ref 6–20)
CO2: 30 mmol/L (ref 22–32)
Calcium: 9.7 mg/dL (ref 8.9–10.3)
Chloride: 100 mmol/L (ref 98–111)
Creatinine, Ser: 1.17 mg/dL (ref 0.61–1.24)
GFR calc non Af Amer: >60 mL/min (ref >60)
Glucose, Bld: 142 mg/dL — ABNORMAL HIGH (ref 70–99)
Potassium: 4.2 mmol/L (ref 3.5–5.1)
Sodium: 139 mmol/L (ref 135–145)

## 2017-12-13 LAB — HEPATIC FUNCTION PANEL
ALT: 22 U/L (ref 0–44)
AST: 18 U/L (ref 15–41)
Albumin: 4.2 g/dL (ref 3.5–5.0)
Alkaline Phosphatase: 45 U/L (ref 38–126)
Bilirubin, Direct: <0.1 mg/dL (ref 0.0–0.2)
Total Bilirubin: 1.4 mg/dL — ABNORMAL HIGH (ref 0.3–1.2)
Total Protein: 7.9 g/dL (ref 6.5–8.1)

## 2017-12-13 LAB — CBC WITH DIFFERENTIAL/PLATELET
BASOS PCT: 0 %
Basophils Absolute: 0 10*3/uL (ref 0.0–0.1)
Eosinophils Absolute: 0 10*3/uL (ref 0.0–0.7)
Eosinophils Relative: 0 %
HCT: 45.4 % (ref 39.0–52.0)
HEMOGLOBIN: 14.9 g/dL (ref 13.0–17.0)
Lymphocytes Relative: 17 %
Lymphs Abs: 1.7 10*3/uL (ref 0.7–4.0)
MCH: 29.8 pg (ref 26.0–34.0)
MCHC: 32.8 g/dL (ref 30.0–36.0)
MCV: 90.8 fL (ref 78.0–100.0)
MONO ABS: 0.7 10*3/uL (ref 0.1–1.0)
MONOS PCT: 6 %
NEUTROS PCT: 76 %
Neutro Abs: 7.9 10*3/uL — ABNORMAL HIGH (ref 1.7–7.7)
PLATELETS: 296 10*3/uL (ref 150–400)
RBC: 5 MIL/uL (ref 4.22–5.81)
RDW: 14.2 % (ref 11.5–15.5)
WBC: 10.3 10*3/uL (ref 4.0–10.5)

## 2017-12-13 LAB — LIPASE, BLOOD: LIPASE: 30 U/L (ref 11–51)

## 2017-12-13 MED ORDER — CIPROFLOXACIN HCL 500 MG PO TABS: 500.0000 mg | ORAL_TABLET | Freq: Two times a day (BID) | ORAL | 0 refills | Status: DC

## 2017-12-13 MED ORDER — METRONIDAZOLE 500 MG PO TABS: 500.0000 mg | ORAL_TABLET | Freq: Three times a day (TID) | ORAL | 0 refills | Status: DC

## 2017-12-13 NOTE — Progress Notes (Signed)
   Subjective:    Patient ID: Austin Woods, male    DOB: 12-Nov-1965, 52 y.o.   MRN: [id number]  HPIabdomen is sore. Feels like somebody hit him with a bat. Pt thinks its his hernia. Started 3 days ago. Treatments tried resting.  Patient relates lower abdominal pain and discomfort he thought it was his hernia but he is not sure he has had a hernia repair in the groin region his pain is more in the left lower quadrant there is no vomiting or diarrhea but he states his energy level is low as well as appetite is low.  He does have an umbilical hernia that he is not certain if that is the cause of this. Sinus pressure. Fluid in left ear.  Started 2 days ago. Pt states he always has sinus problems when weather changes.  Relates some sinus tenderness on the right side  Review of Systems  Constitutional: Negative for activity change, chills and fever.  HENT: Positive for congestion and rhinorrhea. Negative for ear pain.   Eyes: Negative for discharge.  Respiratory: Positive for cough. Negative for wheezing.   Cardiovascular: Negative for chest pain.  Gastrointestinal: Positive for abdominal pain. Negative for constipation, nausea and vomiting.  Musculoskeletal: Negative for arthralgias.       Objective:   Physical Exam  Constitutional: He appears well-nourished. No distress.  HENT:  Head: Normocephalic and atraumatic.  Eyes: Right eye exhibits no discharge. Left eye exhibits no discharge.  Neck: No tracheal deviation present.  Cardiovascular: Normal rate, regular rhythm and normal heart sounds.  No murmur heard. Pulmonary/Chest: Effort normal and breath sounds normal. No respiratory distress.  Abdominal: Soft. He exhibits no mass. There is tenderness.  Musculoskeletal: He exhibits no edema.  Lymphadenopathy:    He has no cervical adenopathy.  Neurological: He is alert. Coordination normal.  Skin: Skin is warm and dry.  Psychiatric: He has a normal mood and affect. His behavior is  normal.  Vitals reviewed.   Sinusitis noted on the right maxillary sinus Tenderness left lower quadrant consistent with diverticulitis No surgical abdomen Patient has diverticular disease on most recent colonoscopy 2 years ago    Assessment & Plan:  Diverticulitis Cipro twice daily 10 days Flagyl 3 times daily 10 days recheck 48 hours lab work ordered Hold off on CAT scan unless lab work is worse or if patient gets worse  Sinusitis should get better on current antibiotics  If high fevers progressive abdominal pain or worse go to ER patient to rest up over the next few days

## 2017-12-15 ENCOUNTER — Ambulatory Visit: Payer: BLUE CROSS/BLUE SHIELD | Admitting: Family Medicine

## 2017-12-15 ENCOUNTER — Encounter: Payer: Self-pay | Admitting: Family Medicine

## 2017-12-15 VITALS — BP 134/86 | Temp 97.9°F | Ht 73.0 in | Wt 272.0 lb

## 2017-12-15 DIAGNOSIS — K5792 Diverticulitis of intestine, part unspecified, without perforation or abscess without bleeding: Secondary | ICD-10-CM

## 2017-12-15 MED ORDER — ONDANSETRON 4 MG PO TBDP: 4.0000 mg | ORAL_TABLET | Freq: Three times a day (TID) | ORAL | 0 refills | Status: DC | PRN

## 2017-12-15 MED ORDER — CHLORZOXAZONE 500 MG PO TABS: 500.0000 mg | ORAL_TABLET | Freq: Three times a day (TID) | ORAL | 2 refills | Status: DC | PRN

## 2017-12-15 NOTE — Progress Notes (Signed)
   Subjective:    Patient ID: Austin Woods, male    DOB: 01-May-1965, 52 y.o.   MRN: [id number]  HPI  Patient arrives for a follow up on diverticulitis. Patient stated he feels a little better but still sore.   Handling the cipro and flagyl well  Overall pain is improved.  Good appetite.  No fever no chills.  Review of systems no vomiting no rash Review of Systems    Objective:   Physical Exam  Alert vitals stable, NAD. Blood pressure good on repeat. HEENT normal. Lungs clear. Heart regular rate and rhythm. Left lower quadrant mild tenderness to deep palpation no rebound no guarding      Assessment & Plan:  Impression improving diverticulitis.  Colonoscopy just a couple years ago. Symptoms continue improving and now gone by the time we finished antibiotics no need for further work-up rationale discussed warning signs discussed

## 2017-12-16 DIAGNOSIS — I10 Essential (primary) hypertension: Secondary | ICD-10-CM | POA: Diagnosis not present

## 2017-12-16 DIAGNOSIS — E559 Vitamin D deficiency, unspecified: Secondary | ICD-10-CM | POA: Diagnosis not present

## 2017-12-16 DIAGNOSIS — E785 Hyperlipidemia, unspecified: Secondary | ICD-10-CM | POA: Diagnosis not present

## 2017-12-16 DIAGNOSIS — Z008 Encounter for other general examination: Secondary | ICD-10-CM | POA: Diagnosis not present

## 2017-12-24 ENCOUNTER — Telehealth: Payer: Self-pay | Admitting: Family Medicine

## 2017-12-24 MED ORDER — FLUCONAZOLE 150 MG PO TABS: ORAL_TABLET | ORAL | 0 refills | Status: DC

## 2017-12-24 MED ORDER — KETOCONAZOLE 2 % EX CREA: 1.0000 "application " | TOPICAL_CREAM | Freq: Two times a day (BID) | CUTANEOUS | 0 refills | Status: DC

## 2017-12-24 NOTE — Telephone Encounter (Signed)
Prescriptions sent electronically to pharmacy. Patient notified. °

## 2017-12-24 NOTE — Telephone Encounter (Signed)
ketoconaole cr 30 g bid affected area, diflucan 150 one three d later

## 2017-12-24 NOTE — Telephone Encounter (Signed)
Left message to return call 

## 2017-12-24 NOTE — Telephone Encounter (Signed)
Patient stated the yeast infection is in his private area

## 2017-12-24 NOTE — Telephone Encounter (Signed)
Patient was seen on 12/15/17 pt is prescribed a antibiotic, which now has resulted in yeast infection, pt requesting a medication to be sent to his listed pharmacy for the infection.   Pharmacy:  [facility] [address], [place]

## 2017-12-24 NOTE — Telephone Encounter (Signed)
Yeast infxn where?

## 2017-12-26 ENCOUNTER — Other Ambulatory Visit: Payer: Self-pay | Admitting: Family Medicine

## 2017-12-27 ENCOUNTER — Encounter: Payer: Self-pay | Admitting: Family Medicine

## 2017-12-27 ENCOUNTER — Ambulatory Visit: Payer: BLUE CROSS/BLUE SHIELD | Admitting: Family Medicine

## 2017-12-27 VITALS — Temp 98.2°F | Ht 73.0 in | Wt 273.2 lb

## 2017-12-27 DIAGNOSIS — E119 Type 2 diabetes mellitus without complications: Secondary | ICD-10-CM | POA: Diagnosis not present

## 2017-12-27 DIAGNOSIS — R1084 Generalized abdominal pain: Secondary | ICD-10-CM | POA: Diagnosis not present

## 2017-12-27 DIAGNOSIS — R109 Unspecified abdominal pain: Secondary | ICD-10-CM

## 2017-12-27 NOTE — Progress Notes (Signed)
   Subjective:    Patient ID: Austin Woods, male    DOB: Jun 27, 1965, 52 y.o.   MRN: [id number]  HPI Patient arrives with ongoing abdominal pain- see 12/13/17 and 12/15/17. Patient was diagnosed and treated for diverticulitis. Patient states he is better but still not completely well. Patient relates intermittent swelling pain discomfort in the abdomen.  Provisionally he was treated with antibiotics for diverticulitis but he has ongoing troubles and problems which raises the question is or something else going on he denies bloody stools but states intermittent loose stools he also relates abdominal swelling bloating feeling along with some discomfort intermittently throughout the mid abdomen and left lower quadrant. He also finds himself feeling a little lightheaded at times he also brings in a copy of his lab work which shows a A1c of 7.0 Review of Systems  Constitutional: Negative for activity change.  HENT: Negative for congestion and rhinorrhea.   Respiratory: Negative for cough and shortness of breath.   Cardiovascular: Negative for chest pain.  Gastrointestinal: Positive for abdominal pain, diarrhea and nausea. Negative for blood in stool and vomiting.  Genitourinary: Negative for dysuria and hematuria.  Neurological: Negative for weakness and headaches.  Psychiatric/Behavioral: Negative for behavioral problems and confusion.       Objective:   Physical Exam  Constitutional: He appears well-nourished. No distress.  HENT:  Head: Normocephalic and atraumatic.  Eyes: Right eye exhibits no discharge. Left eye exhibits no discharge.  Neck: No tracheal deviation present.  Cardiovascular: Normal rate, regular rhythm and normal heart sounds.  No murmur heard. Pulmonary/Chest: Effort normal and breath sounds normal. No respiratory distress.  Abdominal: Normal appearance and bowel sounds are normal. There is no splenomegaly. There is generalized tenderness and tenderness in the left lower  quadrant. There is no rebound and no guarding.  Musculoskeletal: He exhibits no edema.  Lymphadenopathy:    He has no cervical adenopathy.  Neurological: He is alert. Coordination normal.  Skin: Skin is warm and dry.  Psychiatric: He has a normal mood and affect. His behavior is normal.  Vitals reviewed.    He states his loose stools are loose they do not have any mucus or blood-based on this I doubt C. difficile     Assessment & Plan:  Ongoing abdominal pain discomfort at this point time CT scan is reasonable to rule out diverticulitis versus other pathology  Lab work does show A1c 7.0 he will start working on a diabetic diet here to check his sugars periodically throughout the week he will follow-up with Dr. Richardson Landry in approximately 3 weeks I told the patient more than likely he will need to be on metformin but currently having some loose stools because of recent antibiotics hopefully this will clear up by the time he follows up

## 2017-12-27 NOTE — Patient Instructions (Signed)
Use Vit D daily 4,000 IU daily Also Vit B 12 use 1,000 mcg one daily  Use glucometer check sugars in the a.m. 3 times a week and 2 times a week before bedtime  Follow up in 3 to 4 weeks on the diabetes      Diabetes Mellitus and Nutrition When you have diabetes (diabetes mellitus), it is very important to have healthy eating habits because your blood sugar (glucose) levels are greatly affected by what you eat and drink. Eating healthy foods in the appropriate amounts, at about the same times every day, can help you:  Control your blood glucose.  Lower your risk of heart disease.  Improve your blood pressure.  Reach or maintain a healthy weight.  Every person with diabetes is different, and each person has different needs for a meal plan. Your health care provider may recommend that you work with a diet and nutrition specialist (dietitian) to make a meal plan that is best for you. Your meal plan may vary depending on factors such as:  The calories you need.  The medicines you take.  Your weight.  Your blood glucose, blood pressure, and cholesterol levels.  Your activity level.  Other health conditions you have, such as heart or kidney disease.  How do carbohydrates affect me? Carbohydrates affect your blood glucose level more than any other type of food. Eating carbohydrates naturally increases the amount of glucose in your blood. Carbohydrate counting is a method for keeping track of how many carbohydrates you eat. Counting carbohydrates is important to keep your blood glucose at a healthy level, especially if you use insulin or take certain oral diabetes medicines. It is important to know how many carbohydrates you can safely have in each meal. This is different for every person. Your dietitian can help you calculate how many carbohydrates you should have at each meal and for snack. Foods that contain carbohydrates include:  Bread, cereal, rice, pasta, and  crackers.  Potatoes and corn.  Peas, beans, and lentils.  Milk and yogurt.  Fruit and juice.  Desserts, such as cakes, cookies, ice cream, and candy.  How does alcohol affect me? Alcohol can cause a sudden decrease in blood glucose (hypoglycemia), especially if you use insulin or take certain oral diabetes medicines. Hypoglycemia can be a life-threatening condition. Symptoms of hypoglycemia (sleepiness, dizziness, and confusion) are similar to symptoms of having too much alcohol. If your health care provider says that alcohol is safe for you, follow these guidelines:  Limit alcohol intake to no more than 1 drink per day for nonpregnant women and 2 drinks per day for men. One drink equals 12 oz of beer, 5 oz of wine, or 1 oz of hard liquor.  Do not drink on an empty stomach.  Keep yourself hydrated with water, diet soda, or unsweetened iced tea.  Keep in mind that regular soda, juice, and other mixers may contain a lot of sugar and must be counted as carbohydrates.  What are tips for following this plan? Reading food labels  Start by checking the serving size on the label. The amount of calories, carbohydrates, fats, and other nutrients listed on the label are based on one serving of the food. Many foods contain more than one serving per package.  Check the total grams (g) of carbohydrates in one serving. You can calculate the number of servings of carbohydrates in one serving by dividing the total carbohydrates by 15. For example, if a food has 30 g of  total carbohydrates, it would be equal to 2 servings of carbohydrates.  Check the number of grams (g) of saturated and trans fats in one serving. Choose foods that have low or no amount of these fats.  Check the number of milligrams (mg) of sodium in one serving. Most people should limit total sodium intake to less than 2,300 mg per day.  Always check the nutrition information of foods labeled as "low-fat" or "nonfat". These foods  may be higher in added sugar or refined carbohydrates and should be avoided.  Talk to your dietitian to identify your daily goals for nutrients listed on the label. Shopping  Avoid buying canned, premade, or processed foods. These foods tend to be high in fat, sodium, and added sugar.  Shop around the outside edge of the grocery store. This includes fresh fruits and vegetables, bulk grains, fresh meats, and fresh dairy. Cooking  Use low-heat cooking methods, such as baking, instead of high-heat cooking methods like deep frying.  Cook using healthy oils, such as olive, canola, or sunflower oil.  Avoid cooking with butter, cream, or high-fat meats. Meal planning  Eat meals and snacks regularly, preferably at the same times every day. Avoid going long periods of time without eating.  Eat foods high in fiber, such as fresh fruits, vegetables, beans, and whole grains. Talk to your dietitian about how many servings of carbohydrates you can eat at each meal.  Eat 4-6 ounces of lean protein each day, such as lean meat, chicken, fish, eggs, or tofu. 1 ounce is equal to 1 ounce of meat, chicken, or fish, 1 egg, or 1/4 cup of tofu.  Eat some foods each day that contain healthy fats, such as avocado, nuts, seeds, and fish. Lifestyle   Check your blood glucose regularly.  Exercise at least 30 minutes 5 or more days each week, or as told by your health care provider.  Take medicines as told by your health care provider.  Do not use any products that contain nicotine or tobacco, such as cigarettes and e-cigarettes. If you need help quitting, ask your health care provider.  Work with a Social worker or diabetes educator to identify strategies to manage stress and any emotional and social challenges. What are some questions to ask my health care provider?  Do I need to meet with a diabetes educator?  Do I need to meet with a dietitian?  What number can I call if I have questions?  When are the  best times to check my blood glucose? Where to find more information:  American Diabetes Association: diabetes.org/food-and-fitness/food  Academy of Nutrition and Dietetics: PokerClues.dk  [employer] of Diabetes and Digestive and Kidney Diseases (NIH): ContactWire.be Summary  A healthy meal plan will help you control your blood glucose and maintain a healthy lifestyle.  Working with a diet and nutrition specialist (dietitian) can help you make a meal plan that is best for you.  Keep in mind that carbohydrates and alcohol have immediate effects on your blood glucose levels. It is important to count carbohydrates and to use alcohol carefully. This information is not intended to replace advice given to you by your health care provider. Make sure you discuss any questions you have with your health care provider. Document Released: 12/18/2004 Document Revised: 04/27/2016 Document Reviewed: 04/27/2016 Elsevier Interactive Patient Education  [employer].

## 2018-01-04 DIAGNOSIS — Z23 Encounter for immunization: Secondary | ICD-10-CM | POA: Diagnosis not present

## 2018-01-11 ENCOUNTER — Ambulatory Visit (HOSPITAL_COMMUNITY)
Admission: RE | Admit: 2018-01-11 | Discharge: 2018-01-11 | Disposition: A | Discharge status: Home / Self Care | Payer: BLUE CROSS/BLUE SHIELD | Source: Ambulatory Visit | Attending: Nurse Practitioner | Admitting: Nurse Practitioner

## 2018-01-11 VITALS — BP 120/82 | HR 61 | Ht 73.0 in | Wt 272.0 lb

## 2018-01-11 DIAGNOSIS — Z7901 Long term (current) use of anticoagulants: Secondary | ICD-10-CM | POA: Insufficient documentation

## 2018-01-11 DIAGNOSIS — Z8249 Family history of ischemic heart disease and other diseases of the circulatory system: Secondary | ICD-10-CM | POA: Insufficient documentation

## 2018-01-11 DIAGNOSIS — Z86718 Personal history of other venous thrombosis and embolism: Secondary | ICD-10-CM | POA: Diagnosis not present

## 2018-01-11 DIAGNOSIS — Z79899 Other long term (current) drug therapy: Secondary | ICD-10-CM | POA: Diagnosis not present

## 2018-01-11 DIAGNOSIS — Z833 Family history of diabetes mellitus: Secondary | ICD-10-CM | POA: Diagnosis not present

## 2018-01-11 DIAGNOSIS — Z955 Presence of coronary angioplasty implant and graft: Secondary | ICD-10-CM | POA: Diagnosis not present

## 2018-01-11 DIAGNOSIS — I4891 Unspecified atrial fibrillation: Secondary | ICD-10-CM | POA: Diagnosis not present

## 2018-01-11 DIAGNOSIS — K219 Gastro-esophageal reflux disease without esophagitis: Secondary | ICD-10-CM | POA: Diagnosis not present

## 2018-01-11 DIAGNOSIS — I4811 Longstanding persistent atrial fibrillation: Secondary | ICD-10-CM | POA: Diagnosis not present

## 2018-01-11 DIAGNOSIS — Z9889 Other specified postprocedural states: Secondary | ICD-10-CM | POA: Diagnosis not present

## 2018-01-11 DIAGNOSIS — I2510 Atherosclerotic heart disease of native coronary artery without angina pectoris: Secondary | ICD-10-CM | POA: Diagnosis not present

## 2018-01-11 DIAGNOSIS — I480 Paroxysmal atrial fibrillation: Secondary | ICD-10-CM | POA: Diagnosis not present

## 2018-01-11 DIAGNOSIS — Z87891 Personal history of nicotine dependence: Secondary | ICD-10-CM | POA: Diagnosis not present

## 2018-01-11 DIAGNOSIS — I4819 Other persistent atrial fibrillation: Secondary | ICD-10-CM

## 2018-01-11 DIAGNOSIS — I10 Essential (primary) hypertension: Secondary | ICD-10-CM | POA: Insufficient documentation

## 2018-01-11 LAB — MAGNESIUM: MAGNESIUM: 2.1 mg/dL (ref 1.7–2.4)

## 2018-01-11 NOTE — Progress Notes (Signed)
Primary Care Physician: Austin Kirschner, MD Referring [place] is a 52 y.o. male with a h/o afib on Tikosyn on f/u in the afib clinic. He reports that he has not noted any afib. He is being compliant with xarelto, no bleeding issues. He is having some abdominal issues and is pending an abdominal CT.  Today, he denies symptoms of palpitations, chest pain, shortness of breath, orthopnea, PND, lower extremity edema, dizziness, presyncope, syncope, or neurologic sequela. The patient is tolerating medications without difficulties and is otherwise without complaint today.   Past Medical History:  Diagnosis Date  . A-fib ([place])   . Arthritis   . Asthma   . Coronary artery disease   . Dysrhythmia   . GERD (gastroesophageal reflux disease)   . Headache   . High cholesterol   . History of DVT (deep vein thrombosis)    a. left leg following ablation for SVT  . Hypertension   . Persistent atrial fibrillation ([place])    a. Dx 08/2012, xarelto initiated; b. 09/2012 Tikosyn initiated -> DCCV -> Sinus c. PVI 03/2013 d. PVI 03/2014  . Sleep apnea    USES  CPAP   . Wolff-Parkinson-White (WPW) syndrome    a. s/p RFCA 2001 by Dr Caryl Comes, pt reports complicated by post procedure DVT   Past Surgical History:  Procedure Laterality Date  . ATRIAL FIBRILLATION ABLATION  03/23/2013   PVI by DR Rayann Heman for afib  . ATRIAL FIBRILLATION ABLATION N/A 03/23/2013   Procedure: ATRIAL FIBRILLATION ABLATION;  Surgeon: Coralyn Mark, MD;  Location: [place] CATH LAB;  Service: Cardiovascular;  Laterality: N/A;  . ATRIAL FIBRILLATION ABLATION N/A 03/27/2014   PVI Dr Rayann Heman  . BACK SURGERY    . CARDIAC CATHETERIZATION N/A 02/03/2016   Procedure: Left Heart Cath and Coronary Angiography;  Surgeon: Nelva Bush, MD;  Location: [place] CV LAB;  Service: Cardiovascular;  Laterality: N/A;  . CARDIAC CATHETERIZATION N/A 02/03/2016   Procedure: Intravascular Pressure Wire/FFR Study;  Surgeon:  Nelva Bush, MD;  Location: [place] CV LAB;  Service: Cardiovascular;  Laterality: N/A;  . CARDIAC CATHETERIZATION N/A 02/03/2016   Procedure: Coronary Stent Intervention;  Surgeon: Nelva Bush, MD;  Location: [place] CV LAB;  Service: Cardiovascular;  Laterality: N/A;  . CARDIAC SURGERY    . CARDIOVERSION N/A 08/25/2012   Procedure: CARDIOVERSION;  Surgeon: Thayer Headings, MD;  Location: [facility] ENDOSCOPY;  Service: Cardiovascular;  Laterality: N/A;  . CARDIOVERSION N/A 09/06/2012   Procedure: CARDIOVERSION/Bedside;  Surgeon: Carlena Bjornstad, MD;  Location: [place];  Service: Cardiovascular;  Laterality: N/A;  . CARDIOVERSION N/A 01/12/2014   Procedure: CARDIOVERSION;  Surgeon: Pixie Casino, MD;  Location: [facility] ENDOSCOPY;  Service: Cardiovascular;  Laterality: N/A;  . CARDIOVERSION N/A 01/30/2016   Procedure: CARDIOVERSION;  Surgeon: Sueanne Margarita, MD;  Location: [facility] ENDOSCOPY;  Service: Cardiovascular;  Laterality: N/A;  . CARDIOVERSION N/A 04/14/2016   Procedure: CARDIOVERSION;  Surgeon: Pixie Casino, MD;  Location: [facility] ENDOSCOPY;  Service: Cardiovascular;  Laterality: N/A;  . COLONOSCOPY N/A 12/04/2015   Procedure: COLONOSCOPY;  Surgeon: Rogene Houston, MD;  [facility];  Service: Endoscopy;  Laterality: N/A;  730  . CORONARY ANGIOPLASTY    . CORONARY ANGIOPLASTY WITH STENT PLACEMENT    . ELECTROPHYSIOLOGIC STUDY N/A 02/18/2016   Procedure: Atrial Fibrillation Ablation;  Surgeon: Thompson Grayer, MD;  Location: [place] CV LAB;  Service: Cardiovascular;  Laterality: N/A;  . INGUINAL HERNIA REPAIR  Bilateral 06/04/2017   Procedure: OPEN BILATERAL INGUINAL HERNIA REPAIR;  Surgeon: Judeth Horn, MD;  Location: [place];  Service: General;  Laterality: Bilateral;  . LOOP RECORDER IMPLANT N/A 06/29/2014   Procedure: LOOP RECORDER IMPLANT;  Surgeon: Thompson Grayer, MD;  Location: [facility] CATH LAB;  Service: Cardiovascular;  Laterality: N/A;  . NOSE SURGERY     sleep apnea surgery  .  PERIPHERAL VASCULAR CATHETERIZATION N/A 03/13/2016   Procedure: Thrombin Injection;  Surgeon: Serafina Mitchell, MD;  Location: [place] CV LAB;  Service: Cardiovascular;  Laterality: N/A;  . STOMACH SURGERY     reflux, fundoplication  . TEE WITHOUT CARDIOVERSION N/A 08/25/2012   Procedure: TRANSESOPHAGEAL ECHOCARDIOGRAM (TEE);  Surgeon: Thayer Headings, MD;  Location: [place];  Service: Cardiovascular;  Laterality: N/A;  . TEE WITHOUT CARDIOVERSION  03/23/2013   DR ROSS  . TEE WITHOUT CARDIOVERSION N/A 03/23/2013   Procedure: TRANSESOPHAGEAL ECHOCARDIOGRAM (TEE);  Surgeon: Fay Records, MD;  Location: [facility] ENDOSCOPY;  Service: Cardiovascular;  Laterality: N/A;  . TEE WITHOUT CARDIOVERSION N/A 03/26/2014   Procedure: TRANSESOPHAGEAL ECHOCARDIOGRAM (TEE);  Surgeon: Josue Hector, MD;  Location: [facility] ENDOSCOPY;  Service: Cardiovascular;  Laterality: N/A;  . TONSILLECTOMY      Current Outpatient Medications  Medication Sig Dispense Refill  . acetaminophen (TYLENOL) 500 MG tablet Take 1,000 mg by mouth every 6 (six) hours as needed for headache.    . albuterol (PROVENTIL HFA) 108 (90 Base) MCG/ACT inhaler Inhale 2 puffs into the lungs every 4 (four) hours as needed for wheezing or shortness of breath. 1 Inhaler 0  . atorvastatin (LIPITOR) 10 MG tablet Take 1 tablet (10 mg total) by mouth daily. 30 tablet 5  . Cholecalciferol (VITAMIN D) 2000 UNITS CAPS Take 4,000 Units by mouth daily.     . cyanocobalamin 100 MCG tablet Take 100 mcg by mouth daily.    Marland Kitchen dofetilide (TIKOSYN) 500 MCG capsule TAKE 1 CAPSULE BY MOUTH TWO TIMES DAILY (Patient taking differently: TAKE 1 CAPSULE (500mg )  BY MOUTH TWO TIMES DAILY) 180 capsule 3  . enalapril (VASOTEC) 10 MG tablet Take 1 tablet (10 mg total) by mouth 2 (two) times daily. 180 tablet 3  . Fluticasone-Salmeterol (ADVAIR DISKUS) 250-50 MCG/DOSE AEPB Inhale 1 puff into the lungs every 12 (twelve) hours. 60 each 0  . furosemide (LASIX) 40 MG tablet TAKE 1  TABLET BY MOUTH  DAILY 90 tablet 0  . hydrOXYzine (VISTARIL) 25 MG capsule Take 2 capsules (50 mg total) by mouth 3 (three) times daily as needed for anxiety. 30 capsule 0  . ketoconazole (NIZORAL) 2 % cream Apply 1 application topically 2 (two) times daily. 30 g 0  . MAGNESIUM PO Take 500 mg by mouth daily.     . metoprolol succinate (TOPROL-XL) 100 MG 24 hr tablet Take 50 mg by mouth daily 45 tablet 3  . oxybutynin (DITROPAN) 5 MG tablet TAKE ONE-HALF TABLET (2.5mg ) BY  MOUTH TWO TIMES DAILY AS  NEEDED FOR BLADDER SPASMS 30 tablet 5  . pantoprazole (PROTONIX) 40 MG tablet TAKE 1 TABLET (40mg ) BY MOUTH  DAILY 90 tablet 2  . potassium chloride SA (K-DUR,KLOR-CON) 20 MEQ tablet TAKE 1 TABLET (20mg ) BY MOUTH  EVERY DAY 30 tablet 5  . tiZANidine (ZANAFLEX) 4 MG capsule Take one three times a day as needed for spasms 30 capsule 0  . XARELTO 20 MG TABS tablet TAKE 1 TABLET BY MOUTH ONCE DAILY WITH  SUPPER 30 tablet 11  . chlorzoxazone (PARAFON)  500 MG tablet Take 1 tablet (500 mg total) by mouth 3 (three) times daily as needed for muscle spasms. (Patient not taking: Reported on 01/11/2018) 36 tablet 2  . nitroGLYCERIN (NITROSTAT) 0.4 MG SL tablet DISSOLVE 1 TABLET ON THE  TONGUE EVERY 5 MINUTES UP  TO 3 DOSES AS NEEDED FOR  CHEST PAIN (CALL 911 IF  CHEST PAIN PERSISTS) (Patient not taking: Reported on 01/11/2018) 75 tablet 3  . ondansetron (ZOFRAN ODT) 4 MG disintegrating tablet Take 1 tablet (4 mg total) by mouth every 8 (eight) hours as needed for nausea or vomiting. (Patient not taking: Reported on 01/11/2018) 20 tablet 0  . PROCTO-MED HC 2.5 % rectal cream APPLY 3 TIMES DAILY AS  DIRECTED (Patient not taking: Reported on 01/11/2018) 90 g 0   No current facility-administered medications for this encounter.     No Known Allergies  Social History   Socioeconomic History  . Marital status: Married    Spouse name: Not on file  . Number of children: 2  . Years of education: Not on file  . Highest  education level: Not on file  Occupational History    Employer: [employer]  Social Needs  . Financial resource strain: Not on file  . Food insecurity:    Worry: Not on file    Inability: Not on file  . Transportation needs:    Medical: Not on file    Non-medical: Not on file  Tobacco Use  . Smoking status: Former Smoker    Packs/day: 1.00    Years: 7.00    Pack years: 7.00    Types: Cigarettes    Start date: 05/19/2003    Last attempt to quit: 08/06/2010    Years since quitting: 7.4  . Smokeless tobacco: Never Used  Substance and Sexual Activity  . Alcohol use: No    Alcohol/week: 0.0 standard drinks  . Drug use: No  . Sexual activity: Not on file  Lifestyle  . Physical activity:    Days per week: Not on file    Minutes per session: Not on file  . Stress: Not on file  Relationships  . Social connections:    Talks on phone: Not on file    Gets together: Not on file    Attends religious service: Not on file    Active member of club or organization: Not on file    Attends meetings of clubs or organizations: Not on file    Relationship status: Not on file  . Intimate partner violence:    Fear of current or ex partner: Not on file    Emotionally abused: Not on file    Physically abused: Not on file    Forced sexual activity: Not on file  Other Topics Concern  . Not on file  Social History Narrative   Pt lives in [place] with wife.  Works at [employer] History  Problem Relation Age of Onset  . CAD Father 14  . Diabetes Father   . Heart attack Father   . Transient ischemic attack Father   . Vascular Disease Father   . Diabetes Mother   . Anemia Mother   . Gout Mother   . Aneurysm Maternal Grandmother   . Stroke Maternal Grandfather   . Diabetes Paternal Grandmother   . Stroke Paternal Grandfather   . Diabetes Brother     ROS- All systems are reviewed and negative except as per the HPI above  Physical Exam:  Vitals:   01/11/18 1334  BP: 120/82  Pulse: 61   Weight: 123.4 kg  Height: 6\' 1"  (1.854 m)   Wt Readings from Last 3 Encounters:  01/11/18 123.4 kg  12/27/17 123.9 kg  12/15/17 123.4 kg    Labs: Lab Results  Component Value Date   NA 139 12/13/2017   K 4.2 12/13/2017   CL 100 12/13/2017   CO2 30 12/13/2017   GLUCOSE 142 (H) 12/13/2017   BUN 13 12/13/2017   CREATININE 1.17 12/13/2017   CALCIUM 9.7 12/13/2017   MG 2.1 07/12/2017   Lab Results  Component Value Date   INR 1.04 06/02/2017   Lab Results  Component Value Date   CHOL 180 07/29/2017   HDL 38 (L) 07/29/2017   LDLCALC 98 07/29/2017   TRIG 220 (H) 07/29/2017     GEN- The patient is well appearing, alert and oriented x 3 today.   Head- normocephalic, atraumatic Eyes-  Sclera clear, conjunctiva pink Ears- hearing intact Oropharynx- clear Neck- supple, no JVP Lymph- no cervical lymphadenopathy Lungs- Clear to ausculation bilaterally, normal work of breathing Heart- Regular rate and rhythm, no murmurs, rubs or gallops, PMI not laterally displaced GI- soft, NT, ND, + BS Extremities- no clubbing, cyanosis, or edema MS- no significant deformity or atrophy Skin- no rash or lesion Psych- euthymic mood, full affect Neuro- strength and sensation are intact  EKG- NSR at 61 bpm, pr int 152 ms, qrs int 86 ms, qtc 448 ms Epic records reviewed    Assessment and Plan: 1. afib  In SR  Well managed on dofetilide 500 mcg bid Continue xarelto 20 mg daily for  chadsvasc score of  at least 2 bmet/mag  2. HTN Stable  F/u with Dr. Rayann Heman in 6 months  Austin Woods. Austin Woods, [facility] [address] [place], [place] [address] [phone]

## 2018-01-13 DIAGNOSIS — E1165 Type 2 diabetes mellitus with hyperglycemia: Secondary | ICD-10-CM | POA: Diagnosis not present

## 2018-01-13 DIAGNOSIS — Z794 Long term (current) use of insulin: Secondary | ICD-10-CM | POA: Diagnosis not present

## 2018-01-13 DIAGNOSIS — E039 Hypothyroidism, unspecified: Secondary | ICD-10-CM | POA: Diagnosis not present

## 2018-01-13 DIAGNOSIS — Z23 Encounter for immunization: Secondary | ICD-10-CM | POA: Diagnosis not present

## 2018-01-13 DIAGNOSIS — E119 Type 2 diabetes mellitus without complications: Secondary | ICD-10-CM | POA: Diagnosis not present

## 2018-01-18 ENCOUNTER — Ambulatory Visit: Payer: BLUE CROSS/BLUE SHIELD | Admitting: Family Medicine

## 2018-01-28 ENCOUNTER — Ambulatory Visit: Payer: BLUE CROSS/BLUE SHIELD | Admitting: Family Medicine

## 2018-01-28 ENCOUNTER — Telehealth: Payer: Self-pay | Admitting: Family Medicine

## 2018-01-28 NOTE — Telephone Encounter (Signed)
Please advise. Pt is aware that Dr.Steve is going to be out of the office until Tuesday.

## 2018-01-28 NOTE — Telephone Encounter (Signed)
Patient is schedule to come in 02-02-18 for a f/u with Dr. Richardson Landry and he is calling to find out if he is suppose to keep that appt  or reschedule because he still hasn't had his cat scan yet.  He said he was told it would be done in [place] due to cost, but no one has called him in 3 weeks to schedule appt.

## 2018-01-30 NOTE — Telephone Encounter (Signed)
1) lets schedule scan asap  2) delay f u with me until at least three d post scan so we can discuss

## 2018-01-31 NOTE — Telephone Encounter (Signed)
Austin Woods is aware to go ahead and reschedule the scan. She states she got the authorization on this and order was sent to [facility] imaging, they did not call the pt.She will get authorization again and call the imaging center to reschedule.

## 2018-01-31 NOTE — Telephone Encounter (Signed)
I tried calling the pt I left a vm asked that he r/c so we can reschedule the appt, and notify we are rescheduling the appt.

## 2018-01-31 NOTE — Telephone Encounter (Signed)
LMOVM to notify pt of CT appt, also sent detailed MyChart message

## 2018-01-31 NOTE — Telephone Encounter (Signed)
Discussed with pt. Pt verbalized understanding. He is aware appt at [facility] is oct 30th at 8 am. Arrive 7:40 and his appt was changed with dr Richardson Landry til nov 4th. 3 days after the scan. Pt aware of all

## 2018-02-02 ENCOUNTER — Ambulatory Visit: Payer: BLUE CROSS/BLUE SHIELD | Admitting: Family Medicine

## 2018-02-02 ENCOUNTER — Ambulatory Visit
Admission: RE | Admit: 2018-02-02 | Discharge: 2018-02-02 | Disposition: A | Discharge status: Home / Self Care | Payer: BLUE CROSS/BLUE SHIELD | Source: Ambulatory Visit | Attending: Family Medicine | Admitting: Family Medicine

## 2018-02-02 DIAGNOSIS — K429 Umbilical hernia without obstruction or gangrene: Secondary | ICD-10-CM | POA: Diagnosis not present

## 2018-02-02 MED ORDER — IOPAMIDOL (ISOVUE-300) INJECTION 61%
100.0000 mL | Freq: Once | INTRAVENOUS | Status: AC | PRN
  Administered 2018-02-02: 100 mL via INTRAVENOUS

## 2018-02-07 ENCOUNTER — Encounter: Payer: Self-pay | Admitting: Family Medicine

## 2018-02-07 ENCOUNTER — Ambulatory Visit: Payer: BLUE CROSS/BLUE SHIELD | Admitting: Family Medicine

## 2018-02-07 VITALS — BP 130/82 | Ht 73.0 in | Wt 275.4 lb

## 2018-02-07 DIAGNOSIS — R109 Unspecified abdominal pain: Secondary | ICD-10-CM | POA: Diagnosis not present

## 2018-02-07 DIAGNOSIS — E119 Type 2 diabetes mellitus without complications: Secondary | ICD-10-CM

## 2018-02-07 LAB — POCT GLYCOSYLATED HEMOGLOBIN (HGB A1C): HEMOGLOBIN A1C: 6.6 % — AB (ref 4.0–5.6)

## 2018-02-07 NOTE — Progress Notes (Signed)
   Subjective:    Patient ID: Austin Woods, male    DOB: 01-12-1966, 52 y.o.   MRN: [id number]  HPI  Patient arrives to discuss the results of his recent CT scan.  Please see prior notes.  Has presented multiple times with abdominal pain over the last 2 months.  Felt to have diverticulitis.  On the last visit with persistence of symptoms CT scan was arranged.  Patient states he feels somewhat better but still feels bloated in his abdomen no fever no chills.  Regular bowel movements.  Also concerned about his blood work.  A1c showed 7.0% at work.  Patient was told he may or may not have diabetes.  Results for orders placed or performed in visit on 02/07/18  POCT glycosylated hemoglobin (Hb A1C)  Result Value Ref Range   Hemoglobin A1C 6.6 (A) 4.0 - 5.6 %   HbA1c POC (<> result, manual entry)     HbA1c, POC (prediabetic range)     HbA1c, POC (controlled diabetic range)     Patient also notes chronic swelling in the right groin after procedure last year  Review of Systems No headache, no major weight loss or weight gain, no chest pain no back pain abdominal pain no change in bowel habits complete ROS otherwise negative     Objective:   Physical Exam Alert and oriented, vitals reviewed and stable, NAD ENT-TM's and ext canals WNL bilat via otoscopic exam Soft palate, tonsils and post pharynx WNL via oropharyngeal exam Neck-symmetric, no masses; thyroid nonpalpable and nontender Pulmonary-no tachypnea or accessory muscle use; Clear without wheezes via auscultation Card--no abnrml murmurs, rhythm reg and rate WNL Carotid pulses symmetric, without bruits Deep right groin and linear hematoma-like palpable region.       Assessment & Plan:  Impression intra-abdominal fat necrosis.  Discussed.  I advised patient I would touch base with his gastroenterologist for further recommendations.  2/Residual hematoma at [facility] procedure site very slowly resolving  3/New onset tye 2 diab  disacussed. rec f u visit next wk in this regard  2.  Residual

## 2018-02-08 DIAGNOSIS — Z008 Encounter for other general examination: Secondary | ICD-10-CM | POA: Diagnosis not present

## 2018-02-08 DIAGNOSIS — Z719 Counseling, unspecified: Secondary | ICD-10-CM | POA: Diagnosis not present

## 2018-02-08 DIAGNOSIS — E669 Obesity, unspecified: Secondary | ICD-10-CM | POA: Diagnosis not present

## 2018-02-08 DIAGNOSIS — Z713 Dietary counseling and surveillance: Secondary | ICD-10-CM | POA: Diagnosis not present

## 2018-02-09 ENCOUNTER — Telehealth: Payer: Self-pay | Admitting: *Deleted

## 2018-02-09 ENCOUNTER — Other Ambulatory Visit: Payer: Self-pay | Admitting: Family Medicine

## 2018-02-09 NOTE — Telephone Encounter (Signed)
Call pt, we spoke with his gi specialists . They recommended the pt see a geernall surgeon, because sometimes this cdtn (intraabdominal fat necrosis) requires surgery, sometimes not. Lets do referral to dr Arnoldo Morale or his partner for next wk

## 2018-02-09 NOTE — Telephone Encounter (Signed)
Dr Richardson Landry received call from GI

## 2018-02-09 NOTE — Telephone Encounter (Signed)
Referral placed, left message to return call

## 2018-02-14 NOTE — Telephone Encounter (Signed)
I called and left a message to r/c. 

## 2018-02-14 NOTE — Telephone Encounter (Signed)
Discussed with pt. Pt verbalized understanding and states he has an appt with dr Arnoldo Morale tomorrow morning.

## 2018-02-15 ENCOUNTER — Ambulatory Visit: Payer: BLUE CROSS/BLUE SHIELD | Admitting: General Surgery

## 2018-02-15 ENCOUNTER — Encounter: Payer: Self-pay | Admitting: General Surgery

## 2018-02-15 VITALS — BP 134/80 | HR 69 | Temp 98.7°F | Resp 20 | Wt 270.6 lb

## 2018-02-15 DIAGNOSIS — K654 Sclerosing mesenteritis: Secondary | ICD-10-CM | POA: Diagnosis not present

## 2018-02-15 NOTE — Patient Instructions (Signed)
Repeat CT scan in [facility] January. Drink 64 ounces+ of water a day. Take a stool softener daily to twice daily. Over the counter colace.   Follow up after CT scan.

## 2018-02-16 ENCOUNTER — Encounter: Payer: Self-pay | Admitting: Family Medicine

## 2018-02-16 ENCOUNTER — Ambulatory Visit: Payer: BLUE CROSS/BLUE SHIELD | Admitting: Family Medicine

## 2018-02-16 DIAGNOSIS — E119 Type 2 diabetes mellitus without complications: Secondary | ICD-10-CM

## 2018-02-16 HISTORY — DX: Type 2 diabetes mellitus without complications: E11.9

## 2018-02-16 MED ORDER — BLOOD GLUCOSE METER KIT: PACK | 0 refills | Status: AC

## 2018-02-16 NOTE — Progress Notes (Signed)
[facility] History and Physical  Reason for Referral: Intraabdominal fat necrosis  Referring Physician: Dr. Wolfgang Phoenix   Chief Complaint    Abdominal Pain      Austin Woods is a 52 y.o. male.  HPI: Austin Woods is a 52 yo who reports having some abdominal pain mostly in the LLQ that was sharp and some bloating/ diarrhea that started on September and was initially treated as diverticulitis with minimal improvement per his report. He says that he returned to Dr. Wolfgang Phoenix office, and ultimately a CT scan was performed to investigate further and was found to have an area of fat necrosis without other pathology intraabdominal.  He says that now the pain has resolved and he only has a slight discomfort. He does however complain about bloating and feeling full. He has had a Nissen in the past per his report and still gets some indigestion.  He has been recently diagnosed with diabetes and will be starting medications soon.  He says that he has never had an episode of diverticulitis prior and had a colonoscopy in 2017 where two small polyps were removed and he was found to have diverticulosis.  He denies any bleeding from his rectum or weight loss. He says he is normally constipated and has 1 hard stool about every morning.   Past Medical History:  Diagnosis Date  . A-fib ([place])   . Arthritis   . Asthma   . Coronary artery disease   . Dysrhythmia   . GERD (gastroesophageal reflux disease)   . Headache   . High cholesterol   . History of DVT (deep vein thrombosis)    a. left leg following ablation for SVT  . Hypertension   . Persistent atrial fibrillation    a. Dx 08/2012, xarelto initiated; b. 09/2012 Tikosyn initiated -> DCCV -> Sinus c. PVI 03/2013 d. PVI 03/2014  . Sleep apnea    USES  CPAP   . Wolff-Parkinson-White (WPW) syndrome    a. s/p RFCA 2001 by Dr Caryl Comes, pt reports complicated by post procedure DVT    Past Surgical History:  Procedure Laterality Date  . ATRIAL  FIBRILLATION ABLATION  03/23/2013   PVI by DR Rayann Heman for afib  . ATRIAL FIBRILLATION ABLATION N/A 03/23/2013   Procedure: ATRIAL FIBRILLATION ABLATION;  Surgeon: Coralyn Mark, MD;  Location: [place] CATH LAB;  Service: Cardiovascular;  Laterality: N/A;  . ATRIAL FIBRILLATION ABLATION N/A 03/27/2014   PVI Dr Rayann Heman  . BACK SURGERY    . CARDIAC CATHETERIZATION N/A 02/03/2016   Procedure: Left Heart Cath and Coronary Angiography;  Surgeon: Nelva Bush, MD;  Location: [place] CV LAB;  Service: Cardiovascular;  Laterality: N/A;  . CARDIAC CATHETERIZATION N/A 02/03/2016   Procedure: Intravascular Pressure Wire/FFR Study;  Surgeon: Nelva Bush, MD;  Location: [place] CV LAB;  Service: Cardiovascular;  Laterality: N/A;  . CARDIAC CATHETERIZATION N/A 02/03/2016   Procedure: Coronary Stent Intervention;  Surgeon: Nelva Bush, MD;  Location: [place] CV LAB;  Service: Cardiovascular;  Laterality: N/A;  . CARDIAC SURGERY    . CARDIOVERSION N/A 08/25/2012   Procedure: CARDIOVERSION;  Surgeon: Thayer Headings, MD;  Location: [facility] ENDOSCOPY;  Service: Cardiovascular;  Laterality: N/A;  . CARDIOVERSION N/A 09/06/2012   Procedure: CARDIOVERSION/Bedside;  Surgeon: Carlena Bjornstad, MD;  Location: [place];  Service: Cardiovascular;  Laterality: N/A;  . CARDIOVERSION N/A 01/12/2014   Procedure: CARDIOVERSION;  Surgeon: Pixie Casino, MD;  Location: [place];  Service: Cardiovascular;  Laterality:  N/A;  . CARDIOVERSION N/A 01/30/2016   Procedure: CARDIOVERSION;  Surgeon: Sueanne Margarita, MD;  Location: [facility] ENDOSCOPY;  Service: Cardiovascular;  Laterality: N/A;  . CARDIOVERSION N/A 04/14/2016   Procedure: CARDIOVERSION;  Surgeon: Pixie Casino, MD;  Location: [facility] ENDOSCOPY;  Service: Cardiovascular;  Laterality: N/A;  . COLONOSCOPY N/A 12/04/2015   Procedure: COLONOSCOPY;  Surgeon: Rogene Houston, MD;  Location[facility];  Service: Endoscopy;  Laterality: N/A;  730  . CORONARY ANGIOPLASTY      . CORONARY ANGIOPLASTY WITH STENT PLACEMENT    . ELECTROPHYSIOLOGIC STUDY N/A 02/18/2016   Procedure: Atrial Fibrillation Ablation;  Surgeon: Thompson Grayer, MD;  Location: [place] CV LAB;  Service: Cardiovascular;  Laterality: N/A;  . INGUINAL HERNIA REPAIR Bilateral 06/04/2017   Procedure: OPEN BILATERAL INGUINAL HERNIA REPAIR;  Surgeon: Judeth Horn, MD;  Location: [place];  Service: General;  Laterality: Bilateral;  . LOOP RECORDER IMPLANT N/A 06/29/2014   Procedure: LOOP RECORDER IMPLANT;  Surgeon: Thompson Grayer, MD;  Location: [facility] CATH LAB;  Service: Cardiovascular;  Laterality: N/A;  . NOSE SURGERY     sleep apnea surgery  . PERIPHERAL VASCULAR CATHETERIZATION N/A 03/13/2016   Procedure: Thrombin Injection;  Surgeon: Serafina Mitchell, MD;  Location: [place] CV LAB;  Service: Cardiovascular;  Laterality: N/A;  . STOMACH SURGERY     reflux, fundoplication  . TEE WITHOUT CARDIOVERSION N/A 08/25/2012   Procedure: TRANSESOPHAGEAL ECHOCARDIOGRAM (TEE);  Surgeon: Thayer Headings, MD;  Location: [place];  Service: Cardiovascular;  Laterality: N/A;  . TEE WITHOUT CARDIOVERSION  03/23/2013   DR ROSS  . TEE WITHOUT CARDIOVERSION N/A 03/23/2013   Procedure: TRANSESOPHAGEAL ECHOCARDIOGRAM (TEE);  Surgeon: Fay Records, MD;  Location: [facility] ENDOSCOPY;  Service: Cardiovascular;  Laterality: N/A;  . TEE WITHOUT CARDIOVERSION N/A 03/26/2014   Procedure: TRANSESOPHAGEAL ECHOCARDIOGRAM (TEE);  Surgeon: Josue Hector, MD;  Location: [facility] ENDOSCOPY;  Service: Cardiovascular;  Laterality: N/A;  . TONSILLECTOMY      Family History  Problem Relation Age of Onset  . CAD Father 63  . Diabetes Father   . Heart attack Father   . Transient ischemic attack Father   . Vascular Disease Father   . Diabetes Mother   . Anemia Mother   . Gout Mother   . Aneurysm Maternal Grandmother   . Stroke Maternal Grandfather   . Diabetes Paternal Grandmother   . Stroke Paternal Grandfather   . Diabetes Brother      Social History   Tobacco Use  . Smoking status: Former Smoker    Packs/day: 1.00    Years: 7.00    Pack years: 7.00    Types: Cigarettes    Start date: 05/19/2003    Last attempt to quit: 08/06/2010    Years since quitting: 7.5  . Smokeless tobacco: Never Used  Substance Use Topics  . Alcohol use: No    Alcohol/week: 0.0 standard drinks  . Drug use: No    Medications: I have reviewed the patient's current medications. Allergies as of 02/15/2018   No Known Allergies     Medication List        Accurate as of 02/15/18 11:59 PM. Always use your most recent med list.          acetaminophen 500 MG tablet Commonly known as:  TYLENOL Take 1,000 mg by mouth every 6 (six) hours as needed for headache.   albuterol 108 (90 Base) MCG/ACT inhaler Commonly known as:  PROVENTIL HFA;VENTOLIN HFA Inhale  2 puffs into the lungs every 4 (four) hours as needed for wheezing or shortness of breath.   atorvastatin 10 MG tablet Commonly known as:  LIPITOR Take 1 tablet (10 mg total) by mouth daily.   chlorzoxazone 500 MG tablet Commonly known as:  PARAFON Take 1 tablet (500 mg total) by mouth 3 (three) times daily as needed for muscle spasms.   cyanocobalamin 100 MCG tablet Take 100 mcg by mouth daily.   dofetilide 500 MCG capsule Commonly known as:  TIKOSYN TAKE 1 CAPSULE BY MOUTH TWO TIMES DAILY   enalapril 10 MG tablet Commonly known as:  VASOTEC Take 1 tablet (10 mg total) by mouth 2 (two) times daily.   Fluticasone-Salmeterol 250-50 MCG/DOSE Aepb Commonly known as:  ADVAIR Inhale 1 puff into the lungs every 12 (twelve) hours.   furosemide 40 MG tablet Commonly known as:  LASIX TAKE 1 TABLET BY MOUTH  DAILY   hydrOXYzine 25 MG capsule Commonly known as:  VISTARIL Take 2 capsules (50 mg total) by mouth 3 (three) times daily as needed for anxiety.   ketoconazole 2 % cream Commonly known as:  NIZORAL Apply 1 application topically 2 (two) times daily.   MAGNESIUM  PO Take 500 mg by mouth daily.   metoprolol succinate 100 MG 24 hr tablet Commonly known as:  TOPROL-XL Take 50 mg by mouth daily   nitroGLYCERIN 0.4 MG SL tablet Commonly known as:  NITROSTAT DISSOLVE 1 TABLET ON THE  TONGUE EVERY 5 MINUTES UP  TO 3 DOSES AS NEEDED FOR  CHEST PAIN (CALL 911 IF  CHEST PAIN PERSISTS)   ondansetron 4 MG disintegrating tablet Commonly known as:  ZOFRAN-ODT Take 1 tablet (4 mg total) by mouth every 8 (eight) hours as needed for nausea or vomiting.   oxybutynin 5 MG tablet Commonly known as:  DITROPAN TAKE ONE-HALF TABLET (2.5mg ) BY  MOUTH TWO TIMES DAILY AS  NEEDED FOR BLADDER SPASMS   pantoprazole 40 MG tablet Commonly known as:  PROTONIX TAKE 1 TABLET (40mg ) BY MOUTH  DAILY   potassium chloride SA 20 MEQ tablet Commonly known as:  K-DUR,KLOR-CON TAKE 1 TABLET (20mg ) BY MOUTH  EVERY DAY   PROCTO-MED HC 2.5 % rectal cream Generic drug:  hydrocortisone APPLY 3 TIMES DAILY AS  DIRECTED   tiZANidine 4 MG capsule Commonly known as:  ZANAFLEX Take one three times a day as needed for spasms   Vitamin D 50 MCG (2000 UT) Caps Take 4,000 Units by mouth daily.   XARELTO 20 MG Tabs tablet Generic drug:  rivaroxaban TAKE 1 TABLET BY MOUTH ONCE DAILY WITH  SUPPER        ROS:  A comprehensive review of systems was negative except for: Constitutional: positive for chills and fevers Respiratory: positive for SOB Cardiovascular: positive for chest pain and HTN Gastrointestinal: positive for abdominal pain, constipation, nausea and bloating Hematologic/lymphatic: positive for on blood thinner Musculoskeletal: positive for stiff joints Neurological: positive for dizziness and numbness  Blood pressure 134/80, pulse 69, temperature 98.7 F (37.1 C), temperature source Temporal, resp. rate 20, weight 270 lb 9.6 oz (122.7 kg). Physical Exam  Constitutional: He is oriented to person, place, and time. He appears well-developed and well-nourished.  HENT:   Head: Normocephalic.  Eyes: Pupils are equal, round, and reactive to light.  Cardiovascular: Normal rate and regular rhythm.  Pulmonary/Chest: Effort normal and breath sounds normal.  Abdominal: Soft. Normal appearance. There is no tenderness. No hernia.  Healed port sites  Musculoskeletal:  Moves all extremities  Neurological: He is alert and oriented to person, place, and time.  Skin: Skin is warm and dry.  Psychiatric: He has a normal mood and affect. His behavior is normal.  Vitals reviewed.   Results: CT a/p- Personally reviewed area of necrotic appearing fat, location anterior and in the LLQ make omentum most likely versus epiploic fat   IMPRESSION: 1. 7 cm area of fat necrosis within the LOWER LEFT abdomen likely accounting for this patient's abdominal pain. No imaging follow-up of this area recommended. 2. Small umbilical hernia containing fat 3.  Aortic Atherosclerosis (ICD10-I70.0).  Assessment & Plan:  ARCHIT LEGER is a 52 y.o. male with what appears to be necrotic omentum intraabdominally based on the location on the CT scan.  He has improved greatly with the pain.  The fat necrosis is likely secondary to something like a diverticulitis infection and the omentum/ fat got twisted and necrosed.  Other differential diagnosis could be a mass that caused the fat to strangulate but this is not seen on the CT and is less likely. The bloating is likely a separate issue and could be related to constipation, diabetes/ gastroparesis although unlikely, IBS, Etc.   -Given fat necrosis, will repeat CT scan in 8+ weeks to reassess  -Pending scan may have to discuss continued surveillance versus exploration if worried about the area or a mass or some other concerning finding is revealed   All questions were answered to the satisfaction of the patient and family. Patient will call with changes or concerns.  Virl Cagey 02/16/2018, 12:01 PM

## 2018-02-16 NOTE — Progress Notes (Signed)
   Subjective:    Patient ID: Austin Woods, male    DOB: [birth year], 52 y.o.   MRN: [id number]  Abdominal Pain  Chronicity: follow up. The patient is experiencing no pain.   Results for orders placed or performed in visit on 02/07/18  POCT glycosylated hemoglobin (Hb A1C)  Result Value Ref Range   Hemoglobin A1C 6.6 (A) 4.0 - 5.6 %   HbA1c POC (<> result, manual entry)     HbA1c, POC (prediabetic range)     HbA1c, POC (controlled diabetic range)     Strong fam hx of diab, mom and dad and m bro     Has seen glu go up ovdr time Pt here today to discuss new onset on diabetes.    Self grades diet as not f good  Pos sugar in the drinks   nothing but water  Today   Likes cholcoolate    Once per yr t  The eye doc   Review of Systems  Gastrointestinal: Positive for abdominal pain.       Objective:   Physical Exam Alert and oriented, vitals reviewed and stable, NAD ENT-TM's and ext canals WNL bilat via otoscopic exam Soft palate, tonsils and post pharynx WNL via oropharyngeal exam Neck-symmetric, no masses; thyroid nonpalpable and nontender Pulmonary-no tachypnea or accessory muscle use; Clear without wheezes via auscultation Card--no abnrml murmurs, rhythm reg and rate WNL Carotid pulses symmetric, without bruits        Assessment & Plan:  New-onset diabetes.  Type II nature.  Discussed at length strong family history.  Patient elects to hold off on medication for now which I think is reasonable.  Admits to poor diet.  Will begin working immediately.  Educational permission given.  Glucometer prescribed.  Check sugar once to twice per week.  Recommend yearly eye doctor visits.  Advised him to have diabetes.  Free session and possible strongly encouraged.  Warning signs discussed.  Hypoglycemia discussed.  Follow-up and 3 months to schedule.  A1c done.  Greater than 50% of this 25 minute face to face visit was spent in counseling and discussion and coordination of  care regarding the above diagnosis/diagnosies

## 2018-02-18 ENCOUNTER — Other Ambulatory Visit: Payer: Self-pay | Admitting: Family Medicine

## 2018-02-24 DIAGNOSIS — Z6835 Body mass index (BMI) 35.0-35.9, adult: Secondary | ICD-10-CM | POA: Diagnosis not present

## 2018-02-24 DIAGNOSIS — E669 Obesity, unspecified: Secondary | ICD-10-CM | POA: Diagnosis not present

## 2018-02-24 DIAGNOSIS — E119 Type 2 diabetes mellitus without complications: Secondary | ICD-10-CM | POA: Diagnosis not present

## 2018-02-24 DIAGNOSIS — Z719 Counseling, unspecified: Secondary | ICD-10-CM | POA: Diagnosis not present

## 2018-03-07 ENCOUNTER — Telehealth: Payer: Self-pay | Admitting: Family Medicine

## 2018-03-07 DIAGNOSIS — E119 Type 2 diabetes mellitus without complications: Secondary | ICD-10-CM | POA: Diagnosis not present

## 2018-03-07 NOTE — Telephone Encounter (Signed)
Placed order for diabetic supplies to be signed in Dr. Bary Leriche box in office.

## 2018-03-07 NOTE — Telephone Encounter (Signed)
This was completed thank you 

## 2018-03-15 DIAGNOSIS — J0100 Acute maxillary sinusitis, unspecified: Secondary | ICD-10-CM | POA: Diagnosis not present

## 2018-03-18 DIAGNOSIS — G4733 Obstructive sleep apnea (adult) (pediatric): Secondary | ICD-10-CM | POA: Diagnosis not present

## 2018-03-24 DIAGNOSIS — E669 Obesity, unspecified: Secondary | ICD-10-CM | POA: Diagnosis not present

## 2018-03-24 DIAGNOSIS — B3742 Candidal balanitis: Secondary | ICD-10-CM | POA: Diagnosis not present

## 2018-03-24 DIAGNOSIS — E119 Type 2 diabetes mellitus without complications: Secondary | ICD-10-CM | POA: Diagnosis not present

## 2018-03-24 DIAGNOSIS — Z6834 Body mass index (BMI) 34.0-34.9, adult: Secondary | ICD-10-CM | POA: Diagnosis not present

## 2018-03-26 ENCOUNTER — Other Ambulatory Visit: Payer: Self-pay | Admitting: Internal Medicine

## 2018-03-26 ENCOUNTER — Other Ambulatory Visit: Payer: Self-pay | Admitting: Family Medicine

## 2018-04-11 ENCOUNTER — Other Ambulatory Visit: Payer: Self-pay | Admitting: *Deleted

## 2018-04-11 MED ORDER — DOFETILIDE 500 MCG PO CAPS: ORAL_CAPSULE | ORAL | 3 refills | Status: DC

## 2018-04-26 DIAGNOSIS — J0100 Acute maxillary sinusitis, unspecified: Secondary | ICD-10-CM | POA: Diagnosis not present

## 2018-05-17 ENCOUNTER — Ambulatory Visit: Payer: BLUE CROSS/BLUE SHIELD | Admitting: Family Medicine

## 2018-05-17 ENCOUNTER — Encounter: Payer: Self-pay | Admitting: Family Medicine

## 2018-05-17 VITALS — BP 144/88 | Ht 73.0 in | Wt 271.0 lb

## 2018-05-17 DIAGNOSIS — I10 Essential (primary) hypertension: Secondary | ICD-10-CM | POA: Diagnosis not present

## 2018-05-17 DIAGNOSIS — E119 Type 2 diabetes mellitus without complications: Secondary | ICD-10-CM

## 2018-05-17 DIAGNOSIS — J4520 Mild intermittent asthma, uncomplicated: Secondary | ICD-10-CM

## 2018-05-17 LAB — POCT GLYCOSYLATED HEMOGLOBIN (HGB A1C): Hemoglobin A1C: 5.9 % — AB (ref 4.0–5.6)

## 2018-05-17 NOTE — Progress Notes (Signed)
   Subjective:    Patient ID: Austin Woods, male    DOB: 1965/07/21, 53 y.o.   MRN: [id number] Patient arrives with numerous concerns Diabetes  He presents for his follow-up diabetic visit. He has type 2 diabetes mellitus. He is compliant with treatment all of the time. He is following a generally healthy diet. Home blood sugar record trend: 120's - 140's  He does not see a podiatrist.Eye exam is current.   Lightheaded for awhile.   Would like to get metoprolol in 50mg  dose. Has to cut 100mg  tablets in half.   Results for orders placed or performed in visit on 05/17/18  POCT glycosylated hemoglobin (Hb A1C)  Result Value Ref Range   Hemoglobin A1C 5.9 (A) 4.0 - 5.6 %   HbA1c POC (<> result, manual entry)     HbA1c, POC (prediabetic range)     HbA1c, POC (controlled diabetic range)     Patient claims compliance with diabetes medication. No obvious side effects. Reports no substantial low sugar spells. Most numbers are generally in good range when checked fasting. Generally does not miss a dose of medication. Watching diabetic diet closely  .when standing quickly, also when in a hot shower for awhile gets lightheaded Occurs just transiently.  Soon after arising.  For a few minutes at most.  Wonders if it is due to his sugar.  Notes fasting sugars in the neighborhood of 1 30-1 40.   Blood pressure medicine and blood pressure levels reviewed today with patient. Compliant with blood pressure medicine. States does not miss a dose. No obvious side effects. Blood pressure generally good when checked elsewhere. Watching salt intake.   cking glu with glucometer  Review of Systems No headache, no major weight loss or weight gain, no chest pain no back pain abdominal pain no change in bowel habits complete ROS otherwise negative     Objective:   Physical Exam  Alert and oriented, vitals reviewed and stable, NAD ENT-TM's and ext canals WNL bilat via otoscopic exam Soft palate, tonsils and  post pharynx WNL via oropharyngeal exam Neck-symmetric, no masses; thyroid nonpalpable and nontender Pulmonary-no tachypnea or accessory muscle use; Clear without wheezes via auscultation Card--no abnrml murmurs, rhythm reg and rate WNL Carotid pulses symmetric, without bruits       Assessment & Plan:  Impression type 2 diabetes.  Control much improved.  Discussed.  A1c excellent.  Diet discussed.  Patient wishes to hold off on medicines  2.  Transient dizziness.  On further history sounds like orthostatic lightheadedness.  Transient bradycardia importance of current medicines for heart health discussed with patient's diagnosis of  cardiomyopathy  3.  Hypertension good control discussed management  4.  Hyperlipidemia  Diet exercise discussed months for wellness plus chronic

## 2018-05-19 DIAGNOSIS — J45909 Unspecified asthma, uncomplicated: Secondary | ICD-10-CM | POA: Diagnosis not present

## 2018-05-19 DIAGNOSIS — E119 Type 2 diabetes mellitus without complications: Secondary | ICD-10-CM | POA: Diagnosis not present

## 2018-05-19 DIAGNOSIS — Z008 Encounter for other general examination: Secondary | ICD-10-CM | POA: Diagnosis not present

## 2018-05-19 DIAGNOSIS — Z719 Counseling, unspecified: Secondary | ICD-10-CM | POA: Diagnosis not present

## 2018-07-14 DIAGNOSIS — E1165 Type 2 diabetes mellitus with hyperglycemia: Secondary | ICD-10-CM | POA: Diagnosis not present

## 2018-07-14 DIAGNOSIS — Z794 Long term (current) use of insulin: Secondary | ICD-10-CM | POA: Diagnosis not present

## 2018-07-14 DIAGNOSIS — R635 Abnormal weight gain: Secondary | ICD-10-CM | POA: Diagnosis not present

## 2018-07-14 DIAGNOSIS — E039 Hypothyroidism, unspecified: Secondary | ICD-10-CM | POA: Diagnosis not present

## 2018-07-15 ENCOUNTER — Other Ambulatory Visit: Payer: Self-pay

## 2018-07-15 ENCOUNTER — Encounter: Payer: Self-pay | Admitting: Family Medicine

## 2018-07-15 ENCOUNTER — Ambulatory Visit (INDEPENDENT_AMBULATORY_CARE_PROVIDER_SITE_OTHER): Payer: BLUE CROSS/BLUE SHIELD | Admitting: Family Medicine

## 2018-07-15 DIAGNOSIS — J019 Acute sinusitis, unspecified: Secondary | ICD-10-CM

## 2018-07-15 DIAGNOSIS — B9689 Other specified bacterial agents as the cause of diseases classified elsewhere: Secondary | ICD-10-CM | POA: Diagnosis not present

## 2018-07-15 DIAGNOSIS — J301 Allergic rhinitis due to pollen: Secondary | ICD-10-CM

## 2018-07-15 MED ORDER — AZELASTINE HCL 0.1 % NA SOLN: 2.0000 | Freq: Two times a day (BID) | NASAL | 12 refills | Status: DC

## 2018-07-15 MED ORDER — AMOXICILLIN 500 MG PO TABS: 500.0000 mg | ORAL_TABLET | Freq: Three times a day (TID) | ORAL | 0 refills | Status: DC

## 2018-07-15 NOTE — Progress Notes (Signed)
   Subjective:    Patient ID: Austin Woods, male    DOB: 04/23/1965, 53 y.o.   MRN: [id number] Video and telephone  HPI  Patient calls with issues with his allergies. Patient is currently taking Claritin but it is not helping at all this year Patient with significant head congestion drainage coughing sneezing nasal stuffiness also relates his asthma is stable but he is having to use his medicine on a regular basis he denies high fever chills sweats denies difficulty breathing in his chest but does relate a lot of difficulty breathing through his nose denies vomiting diarrhea does not get short of breath moving around his house usually works at [employer] but this coming week they are scheduled to stay at home Virtual Visit via Video Note  I connected with Austin Woods on 07/15/18 at 11:00 AM EDT by a video enabled telemedicine application and verified that I am speaking with the correct person using two identifiers.   I discussed the limitations of evaluation and management by telemedicine and the availability of in person appointments. The patient expressed understanding and agreed to proceed.  History of Present Illness:    Observations/Objective:   Assessment and Plan:   Follow Up Instructions:    I discussed the assessment and treatment plan with the patient. The patient was provided an opportunity to ask questions and all were answered. The patient agreed with the plan and demonstrated an understanding of the instructions.   The patient was advised to call back or seek an in-person evaluation if the symptoms worsen or if the condition fails to improve as anticipated.  I provided 15 minutes of non-face-to-face time during this encounter.      Review of Systems     Objective:   Physical Exam  Physical exam not possible but on video patient was able to make sentences without difficulty breathing      Assessment & Plan:  Acute rhinosinusitis Allergic rhinitis I  would recommend over-the-counter allergy eyedrops, fexofenadine OTC, prescription Astelin, amoxicillin 10 days, continue all asthma medicines and other allergy medicines.  Follow-up if progressive troubles or worse

## 2018-07-15 NOTE — Telephone Encounter (Signed)
Virtual visit scheduled 07/15/18 for allergies.

## 2018-07-23 ENCOUNTER — Other Ambulatory Visit: Payer: Self-pay | Admitting: Family Medicine

## 2018-07-23 ENCOUNTER — Other Ambulatory Visit: Payer: Self-pay | Admitting: Internal Medicine

## 2018-07-26 ENCOUNTER — Other Ambulatory Visit: Payer: Self-pay | Admitting: Cardiovascular Disease

## 2018-07-26 ENCOUNTER — Other Ambulatory Visit: Payer: Self-pay | Admitting: Internal Medicine

## 2018-08-23 ENCOUNTER — Other Ambulatory Visit: Payer: Self-pay | Admitting: Cardiovascular Disease

## 2018-09-07 ENCOUNTER — Other Ambulatory Visit: Payer: Self-pay | Admitting: Cardiovascular Disease

## 2018-09-12 ENCOUNTER — Encounter: Payer: Self-pay | Admitting: Family Medicine

## 2018-09-12 ENCOUNTER — Other Ambulatory Visit: Payer: Self-pay

## 2018-09-12 ENCOUNTER — Ambulatory Visit (INDEPENDENT_AMBULATORY_CARE_PROVIDER_SITE_OTHER): Payer: BC Managed Care – PPO | Admitting: Family Medicine

## 2018-09-12 DIAGNOSIS — E119 Type 2 diabetes mellitus without complications: Secondary | ICD-10-CM | POA: Diagnosis not present

## 2018-09-12 MED ORDER — METFORMIN HCL 1000 MG PO TABS: ORAL_TABLET | ORAL | 1 refills | Status: DC

## 2018-09-12 NOTE — Progress Notes (Signed)
   Subjective:    Patient ID: Austin Woods, male    DOB: [birth year], 53 y.o.   MRN: [id number] Audio only Diabetes  He presents for his follow-up diabetic visit. He has type 2 diabetes mellitus. When asked about current treatments, none were reported. Diabetic current diet: tries to eat health conscious. Exercise: not doing much exercise since covid 19 hit. Home blood sugar record trend: 170. Eye exam is not current (trying to get an appointment ).   Virtual Visit via Telephone Note  I connected with [name] on 09/12/18 at  3:30 PM EDT by telephone and verified that I am speaking with the correct person using two identifiers.  Location: Patient: home  Provider: office   I discussed the limitations, risks, security and privacy concerns of performing an evaluation and management service by telephone and the availability of in person appointments. I also discussed with the patient that there may be a patient responsible charge related to this service. The patient expressed understanding and agreed to proceed.   History of Present Illness:    Observations/Objective:   Assessment and Plan:   Follow Up Instructions:    I discussed the assessment and treatment plan with the patient. The patient was provided an opportunity to ask questions and all were answered. The patient agreed with the plan and demonstrated an understanding of the instructions.   The patient was advised to call back or seek an in-person evaluation if the symptoms worsen or if the condition fails to improve as anticipated.  I provided  106minutes of non-face-to-face time during this encounter.  Patient notes sugars gradually rising.  Most over 150 now.  Most when trying to watch diet.   Review of Systems No headache, no major weight loss or weight gain, no chest pain no back pain abdominal pain no change in bowel habits complete ROS otherwise negative     Objective:   Physical Exam Virtual        Assessment & Plan:  Impression type 2 diabetes.  Suboptimal control discussed options discussed will initiate Metformin long-acting 1000 mg daily for several days then 2000 mg.  Follow-up in 4 months diet exercise discussed

## 2018-09-13 ENCOUNTER — Other Ambulatory Visit: Payer: Self-pay

## 2018-09-13 MED ORDER — FUROSEMIDE 40 MG PO TABS: 40.0000 mg | ORAL_TABLET | Freq: Every day | ORAL | 0 refills | Status: DC

## 2018-09-16 ENCOUNTER — Other Ambulatory Visit: Payer: Self-pay | Admitting: Cardiovascular Disease

## 2018-09-16 MED ORDER — RIVAROXABAN 20 MG PO TABS: ORAL_TABLET | ORAL | 3 refills | Status: DC

## 2018-09-16 NOTE — Telephone Encounter (Signed)
Refill complete 

## 2018-09-16 NOTE — Telephone Encounter (Signed)
Patient called to set up appointment- He will need more pills called in for the XARELTO 20 MG TABS tablet  [place], [place]

## 2018-09-26 ENCOUNTER — Ambulatory Visit: Payer: BLUE CROSS/BLUE SHIELD | Admitting: Physician Assistant

## 2018-10-04 ENCOUNTER — Other Ambulatory Visit: Payer: Self-pay

## 2018-10-04 MED ORDER — AZELASTINE HCL 0.1 % NA SOLN: 2.0000 | Freq: Two times a day (BID) | NASAL | 12 refills | Status: DC

## 2018-10-11 ENCOUNTER — Encounter: Payer: Self-pay | Admitting: Student

## 2018-10-11 ENCOUNTER — Other Ambulatory Visit: Payer: Self-pay

## 2018-10-11 ENCOUNTER — Ambulatory Visit (INDEPENDENT_AMBULATORY_CARE_PROVIDER_SITE_OTHER): Payer: BC Managed Care – PPO | Admitting: Student

## 2018-10-11 VITALS — BP 130/80 | HR 80 | Ht 73.0 in | Wt 277.0 lb

## 2018-10-11 DIAGNOSIS — I4819 Other persistent atrial fibrillation: Secondary | ICD-10-CM

## 2018-10-11 DIAGNOSIS — Z5181 Encounter for therapeutic drug level monitoring: Secondary | ICD-10-CM

## 2018-10-11 DIAGNOSIS — Z79899 Other long term (current) drug therapy: Secondary | ICD-10-CM | POA: Diagnosis not present

## 2018-10-11 DIAGNOSIS — I25118 Atherosclerotic heart disease of native coronary artery with other forms of angina pectoris: Secondary | ICD-10-CM | POA: Diagnosis not present

## 2018-10-11 DIAGNOSIS — R0609 Other forms of dyspnea: Secondary | ICD-10-CM

## 2018-10-11 DIAGNOSIS — G4733 Obstructive sleep apnea (adult) (pediatric): Secondary | ICD-10-CM

## 2018-10-11 DIAGNOSIS — I429 Cardiomyopathy, unspecified: Secondary | ICD-10-CM

## 2018-10-11 DIAGNOSIS — R0600 Dyspnea, unspecified: Secondary | ICD-10-CM

## 2018-10-11 DIAGNOSIS — E119 Type 2 diabetes mellitus without complications: Secondary | ICD-10-CM

## 2018-10-11 DIAGNOSIS — I10 Essential (primary) hypertension: Secondary | ICD-10-CM

## 2018-10-11 NOTE — Progress Notes (Signed)
Cardiology Office Note    Date:  10/11/2018   ID:  Austin Woods, DOB 1965/12/02, MRN [id number]  PCP:  [name], MD  Cardiologist: Kate Sable, MD   EP: Dr. Rayann Heman  Chief Complaint  Patient presents with  . Follow-up    Overdue Visit; Worsening Dyspnea    History of Present Illness:    Austin Woods is a 53 y.o. male with past medical history of CAD (s/p PCI to LAD in 01/2016, persistent atrial fibrillation (s/p multiple ablations, now on Tikosyn), dilated cardiomyopathy, HTN, HLD, OSA, and COPD who presents to the office for overdue follow-up.   He was last examined by Roderic Palau, NP in 01/2018 and denied any recent palpitations. Was maintaining NSR and was continued on Tikosyn 500 mcg BID along with Xarelto for anticoagulation.   In talking with the patient today, he reports worsening dyspnea for the past 2-3 months. Symptoms can occur at rest or with activity. No associated chest pain or palpitations. He does experience intermittent palpitations but says they only last for a few seconds then spontaneously resolve. No sustained symptoms or tachy-palpitations. Denies any recent orthopnea, PND, or edema. Weight has overall been stable on his home scales. No recent travel or extended car trips.   He has been prescribed inhalers by his PCP but does not utilize these regularly. Reports good compliance with his CPAP.    Past Medical History:  Diagnosis Date  . A-fib ([place])   . Arthritis   . Asthma   . Coronary artery disease   . Dysrhythmia   . GERD (gastroesophageal reflux disease)   . Headache   . High cholesterol   . History of DVT (deep vein thrombosis)    a. left leg following ablation for SVT  . Hypertension   . Persistent atrial fibrillation    a. Dx 08/2012, xarelto initiated; b. 09/2012 Tikosyn initiated -> DCCV -> Sinus c. PVI 03/2013 d. PVI 03/2014  . Sleep apnea    USES  CPAP   . Type 2 diabetes mellitus without complication, without long-term  current use of insulin ([place]) 02/16/2018  . Wolff-Parkinson-White (WPW) syndrome    a. s/p RFCA 2001 by Dr Caryl Comes, pt reports complicated by post procedure DVT    Past Surgical History:  Procedure Laterality Date  . ATRIAL FIBRILLATION ABLATION  03/23/2013   PVI by DR Rayann Heman for afib  . ATRIAL FIBRILLATION ABLATION N/A 03/23/2013   Procedure: ATRIAL FIBRILLATION ABLATION;  Surgeon: Coralyn Mark, MD;  Location: [facility] CATH LAB;  Service: Cardiovascular;  Laterality: N/A;  . ATRIAL FIBRILLATION ABLATION N/A 03/27/2014   PVI Dr Rayann Heman  . BACK SURGERY    . CARDIAC CATHETERIZATION N/A 02/03/2016   Procedure: Left Heart Cath and Coronary Angiography;  Surgeon: Nelva Bush, MD;  Location: [facility] CV LAB;  Service: Cardiovascular;  Laterality: N/A;  . CARDIAC CATHETERIZATION N/A 02/03/2016   Procedure: Intravascular Pressure Wire/FFR Study;  Surgeon: Nelva Bush, MD;  Location: [place] CV LAB;  Service: Cardiovascular;  Laterality: N/A;  . CARDIAC CATHETERIZATION N/A 02/03/2016   Procedure: Coronary Stent Intervention;  Surgeon: Nelva Bush, MD;  Location: [facility] CV LAB;  Service: Cardiovascular;  Laterality: N/A;  . CARDIAC SURGERY    . CARDIOVERSION N/A 08/25/2012   Procedure: CARDIOVERSION;  Surgeon: Thayer Headings, MD;  Location: [facility] ENDOSCOPY;  Service: Cardiovascular;  Laterality: N/A;  . CARDIOVERSION N/A 09/06/2012   Procedure: CARDIOVERSION/Bedside;  Surgeon: Carlena Bjornstad, MD;  Location[facility];  Service: Cardiovascular;  Laterality: N/A;  . CARDIOVERSION N/A 01/12/2014   Procedure: CARDIOVERSION;  Surgeon: Pixie Casino, MD;  Location: [facility] ENDOSCOPY;  Service: Cardiovascular;  Laterality: N/A;  . CARDIOVERSION N/A 01/30/2016   Procedure: CARDIOVERSION;  Surgeon: Sueanne Margarita, MD;  Location: [facility] ENDOSCOPY;  Service: Cardiovascular;  Laterality: N/A;  . CARDIOVERSION N/A 04/14/2016   Procedure: CARDIOVERSION;  Surgeon: Pixie Casino, MD;  Location: [facility] ENDOSCOPY;   Service: Cardiovascular;  Laterality: N/A;  . COLONOSCOPY N/A 12/04/2015   Procedure: COLONOSCOPY;  Surgeon: Rogene Houston, MD;  [facility];  Service: Endoscopy;  Laterality: N/A;  730  . CORONARY ANGIOPLASTY    . CORONARY ANGIOPLASTY WITH STENT PLACEMENT    . ELECTROPHYSIOLOGIC STUDY N/A 02/18/2016   Procedure: Atrial Fibrillation Ablation;  Surgeon: Thompson Grayer, MD;  Location: [place] CV LAB;  Service: Cardiovascular;  Laterality: N/A;  . INGUINAL HERNIA REPAIR Bilateral 06/04/2017   Procedure: OPEN BILATERAL INGUINAL HERNIA REPAIR;  Surgeon: Judeth Horn, MD;  Location: [facility];  Service: General;  Laterality: Bilateral;  . LOOP RECORDER IMPLANT N/A 06/29/2014   Procedure: LOOP RECORDER IMPLANT;  Surgeon: Thompson Grayer, MD;  Location: [facility] LAB;  Service: Cardiovascular;  Laterality: N/A;  . NOSE SURGERY     sleep apnea surgery  . PERIPHERAL VASCULAR CATHETERIZATION N/A 03/13/2016   Procedure: Thrombin Injection;  Surgeon: Serafina Mitchell, MD;  Location: [place] CV LAB;  Service: Cardiovascular;  Laterality: N/A;  . STOMACH SURGERY     reflux, fundoplication  . TEE WITHOUT CARDIOVERSION N/A 08/25/2012   Procedure: TRANSESOPHAGEAL ECHOCARDIOGRAM (TEE);  Surgeon: Thayer Headings, MD;  Location: [place];  Service: Cardiovascular;  Laterality: N/A;  . TEE WITHOUT CARDIOVERSION  03/23/2013   DR ROSS  . TEE WITHOUT CARDIOVERSION N/A 03/23/2013   Procedure: TRANSESOPHAGEAL ECHOCARDIOGRAM (TEE);  Surgeon: Fay Records, MD;  Location: [facility] ENDOSCOPY;  Service: Cardiovascular;  Laterality: N/A;  . TEE WITHOUT CARDIOVERSION N/A 03/26/2014   Procedure: TRANSESOPHAGEAL ECHOCARDIOGRAM (TEE);  Surgeon: Josue Hector, MD;  Location: [facility] ENDOSCOPY;  Service: Cardiovascular;  Laterality: N/A;  . TONSILLECTOMY      Current Medications: Outpatient Medications Prior to Visit  Medication Sig Dispense Refill  . acetaminophen (TYLENOL) 500 MG tablet Take 1,000 mg by mouth every 6 (six)  hours as needed for headache.    . albuterol (PROVENTIL HFA) 108 (90 Base) MCG/ACT inhaler Inhale 2 puffs into the lungs every 4 (four) hours as needed for wheezing or shortness of breath. 1 Inhaler 0  . atorvastatin (LIPITOR) 10 MG tablet Take 1 tablet (10 mg total) by mouth daily. 30 tablet 5  . blood glucose meter kit and supplies Dispense based on patient and insurance preference. Use to check blood sugar once a day(FOR ICD-10 E10.9, E11.9). 1 each 0  . Cholecalciferol (VITAMIN D) 2000 UNITS CAPS Take 4,000 Units by mouth daily.     . cyanocobalamin 100 MCG tablet Take 100 mcg by mouth daily.    Marland Kitchen dofetilide (TIKOSYN) 500 MCG capsule TAKE 1 CAPSULE BY MOUTH TWO TIMES DAILY 180 capsule 3  . enalapril (VASOTEC) 10 MG tablet Take 1 tablet (10 mg total) by mouth 2 (two) times daily. 180 tablet 3  . Fluticasone-Salmeterol (ADVAIR DISKUS) 250-50 MCG/DOSE AEPB Inhale 1 puff into the lungs every 12 (twelve) hours. 60 each 0  . furosemide (LASIX) 40 MG tablet Take 1 tablet (40 mg total) by mouth daily. 90 tablet 0  . hydrOXYzine (VISTARIL)  25 MG capsule Take 2 capsules (50 mg total) by mouth 3 (three) times daily as needed for anxiety. 30 capsule 0  . ketoconazole (NIZORAL) 2 % cream Apply 1 application topically 2 (two) times daily. 30 g 0  . MAGNESIUM PO Take 500 mg by mouth daily.     . metFORMIN (GLUCOPHAGE) 1000 MG tablet Take one qam for 7 days, then go to two qam 180 tablet 1  . metoprolol succinate (TOPROL-XL) 100 MG 24 hr tablet Take 50 mg by mouth daily 45 tablet 3  . nitroGLYCERIN (NITROSTAT) 0.4 MG SL tablet DISSOLVE 1 TABLET UNDER THE TONGUE EVERY 5 MINUTES UP  TO 3 DOSES AS NEEDED FOR  CHEST PAIN (CALL 911 IF  CHEST PAIN PERSISTS) 75 tablet 1  . ondansetron (ZOFRAN ODT) 4 MG disintegrating tablet Take 1 tablet (4 mg total) by mouth every 8 (eight) hours as needed for nausea or vomiting. 20 tablet 0  . oxybutynin (DITROPAN) 5 MG tablet TAKE ONE-HALF TABLET (2.54m) BY  MOUTH TWO TIMES DAILY  AS  NEEDED FOR BLADDER SPASMS 30 tablet 5  . pantoprazole (PROTONIX) 40 MG tablet TAKE 1 TABLET (459m BY MOUTH  DAILY 90 tablet 2  . potassium chloride SA (K-DUR,KLOR-CON) 20 MEQ tablet TAKE 1 TABLET (2027mBY MOUTH  EVERY DAY 30 tablet 5  . PROCTO-MED HC 2.5 % rectal cream APPLY 3 TIMES DAILY AS  DIRECTED 90 g 0  . rivaroxaban (XARELTO) 20 MG TABS tablet TAKE 1 TABLET BY MOUTH ONCE DAILY WITH SUPPER 30 tablet 3  . tiZANidine (ZANAFLEX) 4 MG capsule Take one three times a day as needed for spasms 30 capsule 0  . Potassium (POTASSIMIN PO) Take by mouth. 400m40m    . azelastine (ASTELIN) 0.1 % nasal spray Place 2 sprays into both nostrils 2 (two) times daily. 30 mL 12  . chlorzoxazone (PARAFON) 500 MG tablet Take 1 tablet (500 mg total) by mouth 3 (three) times daily as needed for muscle spasms. (Patient not taking: Reported on 09/12/2018) 36 tablet 2   No facility-administered medications prior to visit.      Allergies:   Patient has no known allergies.   Social History   Socioeconomic History  . Marital status: Married    Spouse name: Not on file  . Number of children: 2  . Years of education: Not on file  . Highest education level: Not on file  Occupational History    Employer: [employer]  Social Needs  . Financial resource strain: Not on file  . Food insecurity    Worry: Not on file    Inability: Not on file  . Transportation needs    Medical: Not on file    Non-medical: Not on file  Tobacco Use  . Smoking status: Former Smoker    Packs/day: 1.00    Years: 7.00    Pack years: 7.00    Types: Cigarettes    Start date: 05/19/2003    Quit date: 08/06/2010    Years since quitting: 8.1  . Smokeless tobacco: Never Used  Substance and Sexual Activity  . Alcohol use: No    Alcohol/week: 0.0 standard drinks  . Drug use: No  . Sexual activity: Not on file  Lifestyle  . Physical activity    Days per week: Not on file    Minutes per session: Not on file  . Stress: Not on file   Relationships  . Social connections    Talks on phone: Not on  file    Gets together: Not on file    Attends religious service: Not on file    Active member of club or organization: Not on file    Attends meetings of clubs or organizations: Not on file    Relationship status: Not on file  Other Topics Concern  . Not on file  Social History Narrative   Pt lives in [place] with wife.  Works at [employer] History:  The patient's family history includes Anemia in his mother; Aneurysm in his maternal grandmother; CAD (age of onset: 35) in his father; Diabetes in his brother, father, mother, and paternal grandmother; Gout in his mother; Heart attack in his father; Stroke in his maternal grandfather and paternal grandfather; Transient ischemic attack in his father; Vascular Disease in his father.   Review of Systems:   Please see the history of present illness.     General:  No chills, fever, night sweats or weight changes.  Cardiovascular:  No chest pain, edema, orthopnea, palpitations, paroxysmal nocturnal dyspnea. Positive for dyspnea on exertion.  Dermatological: No rash, lesions/masses Respiratory: No cough, dyspnea Urologic: No hematuria, dysuria Abdominal:   No nausea, vomiting, diarrhea, bright red blood per rectum, melena, or hematemesis Neurologic:  No visual changes, wkns, changes in mental status. All other systems reviewed and are otherwise negative except as noted above.   Physical Exam:    VS:  BP 130/80 (BP Location: Left Arm)   Pulse 80   Ht 6' 1" (1.854 m)   Wt 277 lb (125.6 kg)   SpO2 97%   BMI 36.55 kg/m    General: Well developed, well nourished,male appearing in no acute distress. Head: Normocephalic, atraumatic, sclera non-icteric, no xanthomas, nares are without discharge.  Neck: No carotid bruits. JVD not elevated.  Lungs: Respirations regular and unlabored, without wheezes or rales.  Heart: Regular rate and rhythm. No S3 or S4.  No murmur, no rubs, or  gallops appreciated. Abdomen: Soft, non-tender, non-distended with normoactive bowel sounds. No hepatomegaly. No rebound/guarding. No obvious abdominal masses. Msk:  Strength and tone appear normal for age. No joint deformities or effusions. Extremities: No clubbing or cyanosis. No lower extremity edema.  Distal pedal pulses are 2+ bilaterally. Neuro: Alert and oriented X 3. Moves all extremities spontaneously. No focal deficits noted. Psych:  Responds to questions appropriately with a normal affect. Skin: No rashes or lesions noted  Wt Readings from Last 3 Encounters:  10/11/18 277 lb (125.6 kg)  05/17/18 271 lb (122.9 kg)  02/16/18 274 lb 12.8 oz (124.6 kg)     Studies/Labs Reviewed:   EKG:  EKG is ordered today.  The ekg ordered today demonstrates NSR, HR 64 with baseline artifact. Nonspecific ST abnormality along inferior leads similar to prior tracings. QTc 454 ms.  Recent Labs: 12/13/2017: ALT 22; BUN 13; Creatinine, Ser 1.17; Hemoglobin 14.9; Platelets 296; Potassium 4.2; Sodium 139 01/11/2018: Magnesium 2.1   Lipid Panel    Component Value Date/Time   CHOL 180 07/29/2017 1433   TRIG 220 (H) 07/29/2017 1433   HDL 38 (L) 07/29/2017 1433   CHOLHDL 4.7 07/29/2017 1433   CHOLHDL 4.7 01/31/2016 0308   VLDL 56 (H) 01/31/2016 0308   LDLCALC 98 07/29/2017 1433    Additional studies/ records that were reviewed today include:   Cardiac Catheterization: 01/2016  Ost 1st Diag lesion, 50 %stenosed.  Post intervention, there is a 30% residual stenosis.   Conclusions: 1. Mild to moderate LMCA and  moderate to severe proximal/mid LAD and D1 disease. FFR of proximal/mid LAD stenosis was significant at 0.72 with greatest pressure drop occurring across this lesion. 2. Normal left ventricular filling pressure. 3. Successful FFR guided PCI to proximal/mid LAD with 0% residual stenosis and TIMI-3 flow. Prevention to mid LAD resulted in total occlusion of the ostium of D1. TIMI-3 flow was  successfully restored with 30% residual stenosis using balloon angioplasty with kissing balloon inflation.  Recommendations: 1. Dual antiplatelet therapy with aspirin and clopidogrel for at least a month, in addition to rivaroxaban or other anticoagulation. At that point, could consider discontinuation of aspirin if no ischemic symptoms or present. 2. Aggressive secondary prevention. 3. Restart heparin infusion for atrial fibrillation 2 hours after TR band has been deflated.   Echocardiogram: 01/2016 Study Conclusions  - Left ventricle: The cavity size was mildly dilated. Systolic   function was mildly reduced. The estimated ejection fraction was   in the range of 45% to 50%. Moderate hypokinesis of the apical   myocardium. Left ventricular diastolic function parameters were   normal for the patient&'s age.   Assessment:    1. Coronary artery disease involving native coronary artery of native heart with other form of angina pectoris ([place])   2. Dyspnea on exertion   3. Persistent atrial fibrillation   4. Visit for monitoring Tikosyn therapy   5. Secondary cardiomyopathy ([place])   6. Essential hypertension   7. Type 2 diabetes mellitus without complication, without long-term current use of insulin ([place])   8. Obstructive sleep apnea      Plan:   In order of problems listed above:  1. CAD - he is s/p PCI/DES to proximal LAD in 01/2016 with angioplasty alone to D1 at that time. Reports intermittent dyspnea at rest and with activity as outlined above but denies any chest pain. His breathing actually improves at times with activity. His prior angina was actually dizziness, fatigue, and presyncope and at this time he denies any of those symptoms. Will obtain an echocardiogram for initial assessment to assess LV function and wall motion. Pending results and reassessment of symptoms, could consider ischemic evaluation at follow-up. I encouraged him to follow-up with his PCP as well as I  suspect his COPD is playing a role. - continue statin (goal LDL less than 70) and BB therapy. Not on ASA given the need for anticoagulation.   2. Persistent Atrial Fibrillation - s/p multiple ablations, now on Tikosyn and followed by the [facility]. Maintaining NSR by examination and EKG today. Continue Tikosyn at current dosing along with Toprol-XL.  - he denies any evidence of active bleeding. Continue Xarelto for anticoagulation. Obtain CBC and BMET as it has been close to a year since his most recent labs.  3. Dilated cardiomyopathy - he reports worsening dyspnea as outlined above but denies any specific orthopnea, PND, or edema. Appears euvolemic by examination. Will obtain a repeat echocardiogram to assess LV function and wall motion. Remains on Lasix 75m daily. Will update BMET.   4. HTN - BP well-controlled at 130/80 during today's visit. - continue Enalapril 176mBID and Toprol-XL 5098maily.   5. Type 2 DM - followed by PCP. He reports missing his most recent labs due to COVID-19. Will add on Hgb A1c with his upcoming lab work per his request and forward to his PCP once available. Currently on Metformin 1000m43mD.   6. OSA - continued compliance with CPAP encouraged.    Medication Adjustments/Labs and  Tests Ordered: Current medicines are reviewed at length with the patient today.  Concerns regarding medicines are outlined above.  Medication changes, Labs and Tests ordered today are listed in the Patient Instructions below. Patient Instructions  Medication Instructions:  Your physician recommends that you continue on your current medications as directed. Please refer to the Current Medication list given to you today.   Labwork: BMET CBC A12C  Testing/Procedures: Your physician has requested that you have an echocardiogram. Echocardiography is a painless test that uses sound waves to create images of your heart. It provides your doctor with information  about the size and shape of your heart and how well your heart's chambers and valves are working. This procedure takes approximately one hour. There are no restrictions for this procedure.    Follow-Up: Your physician recommends that you schedule a follow-up appointment in: 2-3 MONTHS    Any Other Special Instructions Will Be Listed Below (If Applicable).   If you need a refill on your cardiac medications before your next appointment, please call your pharmacy.   Signed, Erma Heritage, PA-C  10/11/2018 7:26 PM    [place] [address] [place], [place] [address] Phone: [phone] Fax: [phone]

## 2018-10-11 NOTE — Patient Instructions (Signed)
Medication Instructions:  Your physician recommends that you continue on your current medications as directed. Please refer to the Current Medication list given to you today.   Labwork: BMET CBC A12C  Testing/Procedures: Your physician has requested that you have an echocardiogram. Echocardiography is a painless test that uses sound waves to create images of your heart. It provides your doctor with information about the size and shape of your heart and how well your heart's chambers and valves are working. This procedure takes approximately one hour. There are no restrictions for this procedure.    Follow-Up: Your physician recommends that you schedule a follow-up appointment in: 2-3 MONTHS    Any Other Special Instructions Will Be Listed Below (If Applicable).     If you need a refill on your cardiac medications before your next appointment, please call your pharmacy.

## 2018-10-14 ENCOUNTER — Telehealth: Payer: Self-pay

## 2018-10-14 ENCOUNTER — Other Ambulatory Visit: Payer: Self-pay

## 2018-10-14 ENCOUNTER — Other Ambulatory Visit (HOSPITAL_COMMUNITY)
Admission: RE | Admit: 2018-10-14 | Discharge: 2018-10-14 | Disposition: A | Discharge status: Home / Self Care | Payer: BC Managed Care – PPO | Source: Ambulatory Visit | Attending: Physician Assistant | Admitting: Physician Assistant

## 2018-10-14 ENCOUNTER — Ambulatory Visit (HOSPITAL_COMMUNITY)
Admission: RE | Admit: 2018-10-14 | Discharge: 2018-10-14 | Disposition: A | Discharge status: Home / Self Care | Payer: BC Managed Care – PPO | Source: Ambulatory Visit | Attending: Physician Assistant | Admitting: Physician Assistant

## 2018-10-14 DIAGNOSIS — R0609 Other forms of dyspnea: Secondary | ICD-10-CM

## 2018-10-14 DIAGNOSIS — R0600 Dyspnea, unspecified: Secondary | ICD-10-CM

## 2018-10-14 LAB — BASIC METABOLIC PANEL
Anion gap: 10 (ref 5–15)
BUN: 23 mg/dL — ABNORMAL HIGH (ref 6–20)
CO2: 26 mmol/L (ref 22–32)
Calcium: 9.1 mg/dL (ref 8.9–10.3)
Chloride: 104 mmol/L (ref 98–111)
Creatinine, Ser: 1.28 mg/dL — ABNORMAL HIGH (ref 0.61–1.24)
GFR calc Af Amer: >60 mL/min (ref >60)
GFR calc non Af Amer: >60 mL/min (ref >60)
Glucose, Bld: 106 mg/dL — ABNORMAL HIGH (ref 70–99)
Potassium: 4.4 mmol/L (ref 3.5–5.1)
Sodium: 140 mmol/L (ref 135–145)

## 2018-10-14 LAB — CBC WITH DIFFERENTIAL/PLATELET
Abs Immature Granulocytes: 0.03 10*3/uL (ref 0.00–0.07)
Basophils Absolute: 0.1 10*3/uL (ref 0.0–0.1)
Basophils Relative: 1 %
Eosinophils Absolute: 0.1 10*3/uL (ref 0.0–0.5)
Eosinophils Relative: 2 %
HCT: 38.4 % — ABNORMAL LOW (ref 39.0–52.0)
Hemoglobin: 11.8 g/dL — ABNORMAL LOW (ref 13.0–17.0)
Immature Granulocytes: 0 %
Lymphocytes Relative: 23 %
Lymphs Abs: 2 10*3/uL (ref 0.7–4.0)
MCH: 26.8 pg (ref 26.0–34.0)
MCHC: 30.7 g/dL (ref 30.0–36.0)
MCV: 87.1 fL (ref 80.0–100.0)
Monocytes Absolute: 0.7 10*3/uL (ref 0.1–1.0)
Monocytes Relative: 8 %
Neutro Abs: 5.9 10*3/uL (ref 1.7–7.7)
Neutrophils Relative %: 66 %
Platelets: 370 10*3/uL (ref 150–400)
RBC: 4.41 MIL/uL (ref 4.22–5.81)
RDW: 15.5 % (ref 11.5–15.5)
WBC: 8.8 10*3/uL (ref 4.0–10.5)
nRBC: 0 % (ref 0.0–0.2)

## 2018-10-14 LAB — HEMOGLOBIN A1C
Hgb A1c MFr Bld: 7.1 % — ABNORMAL HIGH (ref 4.8–5.6)
Mean Plasma Glucose: 157.07 mg/dL

## 2018-10-14 NOTE — Progress Notes (Signed)
*  PRELIMINARY RESULTS* Echocardiogram 2D Echocardiogram has been performed.  Austin Woods 10/14/2018, 9:20 AM

## 2018-10-14 NOTE — Telephone Encounter (Signed)
Pt made aware, voiced understanding. Copy to pcp.  

## 2018-10-14 NOTE — Telephone Encounter (Signed)
-----   Message from Erma Heritage, [place] sent at 10/14/2018  4:04 PM EDT ----- Please let the patient know his echocardiogram showed normal pumping function of the heart with a preserved EF of 60-65%. No significant wall motion abnormalities or valve abnormalities. Given elevated creatinine earlier and reassuring echo results, would reduce Lasix to 20mg  daily.   Hgb A1c was checked as well as the patient requested this due to missing prior labs. Was elevated to 7.1 having been at 5.9 five months prior.

## 2018-11-08 ENCOUNTER — Other Ambulatory Visit: Payer: Self-pay

## 2018-11-08 ENCOUNTER — Ambulatory Visit (INDEPENDENT_AMBULATORY_CARE_PROVIDER_SITE_OTHER): Payer: BC Managed Care – PPO | Admitting: Family Medicine

## 2018-11-08 ENCOUNTER — Encounter: Payer: Self-pay | Admitting: Family Medicine

## 2018-11-08 VITALS — BP 138/88 | Temp 97.1°F | Ht 73.0 in | Wt 278.0 lb

## 2018-11-08 DIAGNOSIS — D649 Anemia, unspecified: Secondary | ICD-10-CM | POA: Diagnosis not present

## 2018-11-08 DIAGNOSIS — R109 Unspecified abdominal pain: Secondary | ICD-10-CM | POA: Diagnosis not present

## 2018-11-08 DIAGNOSIS — K654 Sclerosing mesenteritis: Secondary | ICD-10-CM

## 2018-11-08 DIAGNOSIS — I10 Essential (primary) hypertension: Secondary | ICD-10-CM

## 2018-11-08 DIAGNOSIS — E119 Type 2 diabetes mellitus without complications: Secondary | ICD-10-CM

## 2018-11-08 DIAGNOSIS — Z125 Encounter for screening for malignant neoplasm of prostate: Secondary | ICD-10-CM

## 2018-11-08 DIAGNOSIS — Z0000 Encounter for general adult medical examination without abnormal findings: Secondary | ICD-10-CM

## 2018-11-08 DIAGNOSIS — J4520 Mild intermittent asthma, uncomplicated: Secondary | ICD-10-CM

## 2018-11-08 DIAGNOSIS — Z1211 Encounter for screening for malignant neoplasm of colon: Secondary | ICD-10-CM

## 2018-11-08 NOTE — Progress Notes (Signed)
Subjective:    Patient ID: Austin Woods, male    DOB: Sep 30, 1965, 53 y.o.   MRN: [id number]  HPI The patient comes in today for a wellness visit.  Results for orders placed or performed during the hospital encounter of 13/24/40  Basic metabolic panel  Result Value Ref Range   Sodium 140 135 - 145 mmol/L   Potassium 4.4 3.5 - 5.1 mmol/L   Chloride 104 98 - 111 mmol/L   CO2 26 22 - 32 mmol/L   Glucose, Bld 106 (H) 70 - 99 mg/dL   BUN 23 (H) 6 - 20 mg/dL   Creatinine, Ser 1.28 (H) 0.61 - 1.24 mg/dL   Calcium 9.1 8.9 - 10.3 mg/dL   GFR calc non Af Amer >60 >60 mL/min   GFR calc Af Amer >60 >60 mL/min   Anion gap 10 5 - 15  CBC with Differential/Platelet  Result Value Ref Range   WBC 8.8 4.0 - 10.5 K/uL   RBC 4.41 4.22 - 5.81 MIL/uL   Hemoglobin 11.8 (L) 13.0 - 17.0 g/dL   HCT 38.4 (L) 39.0 - 52.0 %   MCV 87.1 80.0 - 100.0 fL   MCH 26.8 26.0 - 34.0 pg   MCHC 30.7 30.0 - 36.0 g/dL   RDW 15.5 11.5 - 15.5 %   Platelets 370 150 - 400 K/uL   nRBC 0.0 0.0 - 0.2 %   Neutrophils Relative % 66 %   Neutro Abs 5.9 1.7 - 7.7 K/uL   Lymphocytes Relative 23 %   Lymphs Abs 2.0 0.7 - 4.0 K/uL   Monocytes Relative 8 %   Monocytes Absolute 0.7 0.1 - 1.0 K/uL   Eosinophils Relative 2 %   Eosinophils Absolute 0.1 0.0 - 0.5 K/uL   Basophils Relative 1 %   Basophils Absolute 0.1 0.0 - 0.1 K/uL   Immature Granulocytes 0 %   Abs Immature Granulocytes 0.03 0.00 - 0.07 K/uL  Hemoglobin A1c  Result Value Ref Range   Hgb A1c MFr Bld 7.1 (H) 4.8 - 5.6 %   Mean Plasma Glucose 157.07 mg/dL     A review of their health history was completed.  A review of medications was also completed.  Any needed refills; none at this time  Eating habits: pt eats well   Falls/  MVA accidents in past few months: none  Regular exercise: pt works 3rd shift   Specialist pt sees on regular basis: cardiologist  Preventative health issues were discussed.   Additional concerns: stomach issues; burning in  left leg; cramps in left leg;   Patient claims compliance with diabetes medication. No obvious side effects. Reports no substantial low sugar spells. Most numbers are generally in good range when checked fasting. Generally does not miss a dose of medication. Watching diabetic diet closely  Patient notes ongoing abdominal discomfort.  Review of records shows he did not follow-up with a follow-up scan is recommended by the surgeon.  Blood pressure medicine and blood pressure levels reviewed today with patient. Compliant with blood pressure medicine. States does not miss a dose. No obvious side effects. Blood pressure generally good when checked elsewhere. Watching salt intake.   Review of Systems  Constitutional: Negative for activity change, appetite change and fever.  HENT: Negative for congestion and rhinorrhea.   Eyes: Negative for discharge.  Respiratory: Negative for cough and wheezing.   Cardiovascular: Negative for chest pain.  Gastrointestinal: Negative for abdominal pain, blood in stool and vomiting.  Genitourinary: Negative  for difficulty urinating and frequency.  Musculoskeletal: Negative for neck pain.  Skin: Negative for rash.  Allergic/Immunologic: Negative for environmental allergies and food allergies.  Neurological: Negative for weakness and headaches.  Psychiatric/Behavioral: Negative for agitation.  All other systems reviewed and are negative.      Objective:   Physical Exam        Assessment & Plan:  Impression 1 wellness exam.  Diet discussed exercise discussed.  Vaccines discussed up-to-date on colonoscopy  2.  Persistent abdominal pain.  History of abdominal omental difficulty, did not follow-up with general surgeon as she had requested.  Referral  3.  Type 2 diabetes control overall.  Discussed to maintain same  4.  Hypertension good control discussed to maintain same  5.  Borderline anemia we will do some further blood work to assess    As per  orders medications refilled diet exercise discussed

## 2018-11-15 ENCOUNTER — Encounter: Payer: Self-pay | Admitting: Family Medicine

## 2018-11-17 ENCOUNTER — Telehealth: Payer: Self-pay | Admitting: Family Medicine

## 2018-11-17 NOTE — Telephone Encounter (Signed)
Patient scheduled office visit to discuss with Dr Richardson Landry

## 2018-11-17 NOTE — Telephone Encounter (Addendum)
Left message to return call to schedule office visit with Dr Richardson Landry to discuss alternatives

## 2018-11-17 NOTE — Telephone Encounter (Signed)
Discussed with Ebony Hail at Dr Constance Haw office and advised Dr Richardson Landry will handle the scan and follow up visit.

## 2018-11-17 NOTE — Telephone Encounter (Signed)
Call back [place] and tell her the reason I referred the pt thought dr bridges wanted to do a scan and wanted to see him again, and dr bridges last note reflected that. Tell her thanks but we will take care of things. rec o v for pt next wk to disc alternatives

## 2018-11-17 NOTE — Telephone Encounter (Signed)
Austin Woods with [facility] called to state that Dr. Constance Haw wouldn't see pt until he had a workup including a new scan & she's going out on vacation for a couple week soon   Please advise

## 2018-11-18 ENCOUNTER — Other Ambulatory Visit: Payer: Self-pay | Admitting: *Deleted

## 2018-11-18 DIAGNOSIS — Z0000 Encounter for general adult medical examination without abnormal findings: Secondary | ICD-10-CM

## 2018-11-18 DIAGNOSIS — E119 Type 2 diabetes mellitus without complications: Secondary | ICD-10-CM

## 2018-11-18 DIAGNOSIS — R109 Unspecified abdominal pain: Secondary | ICD-10-CM

## 2018-11-18 DIAGNOSIS — I10 Essential (primary) hypertension: Secondary | ICD-10-CM

## 2018-11-18 DIAGNOSIS — Z1211 Encounter for screening for malignant neoplasm of colon: Secondary | ICD-10-CM

## 2018-11-18 DIAGNOSIS — D649 Anemia, unspecified: Secondary | ICD-10-CM

## 2018-11-18 DIAGNOSIS — Z125 Encounter for screening for malignant neoplasm of prostate: Secondary | ICD-10-CM

## 2018-11-18 LAB — POC HEMOCCULT BLD/STL (HOME/3-CARD/SCREEN)
Card #2 Fecal Occult Blod, POC: NEGATIVE
Card #3 Fecal Occult Blood, POC: NEGATIVE
Fecal Occult Blood, POC: NEGATIVE

## 2018-11-25 ENCOUNTER — Ambulatory Visit: Payer: BC Managed Care – PPO | Admitting: Family Medicine

## 2018-11-25 ENCOUNTER — Encounter: Payer: Self-pay | Admitting: Family Medicine

## 2018-11-25 ENCOUNTER — Other Ambulatory Visit: Payer: Self-pay

## 2018-11-25 ENCOUNTER — Other Ambulatory Visit: Payer: Self-pay | Admitting: Family Medicine

## 2018-11-25 VITALS — Ht 73.0 in | Wt 278.0 lb

## 2018-11-25 DIAGNOSIS — K654 Sclerosing mesenteritis: Secondary | ICD-10-CM | POA: Diagnosis not present

## 2018-11-25 DIAGNOSIS — R109 Unspecified abdominal pain: Secondary | ICD-10-CM | POA: Diagnosis not present

## 2018-11-25 DIAGNOSIS — Z1211 Encounter for screening for malignant neoplasm of colon: Secondary | ICD-10-CM | POA: Diagnosis not present

## 2018-11-25 DIAGNOSIS — I10 Essential (primary) hypertension: Secondary | ICD-10-CM | POA: Diagnosis not present

## 2018-11-25 DIAGNOSIS — R1084 Generalized abdominal pain: Secondary | ICD-10-CM

## 2018-11-25 DIAGNOSIS — D649 Anemia, unspecified: Secondary | ICD-10-CM | POA: Diagnosis not present

## 2018-11-25 DIAGNOSIS — E119 Type 2 diabetes mellitus without complications: Secondary | ICD-10-CM | POA: Diagnosis not present

## 2018-11-25 MED ORDER — MAGIC MOUTHWASH: 15.0000 mL | Freq: Four times a day (QID) | ORAL | 0 refills | Status: DC

## 2018-11-25 NOTE — Progress Notes (Signed)
   Subjective:    Patient ID: Austin Woods, male    DOB: 11/03/1965, 53 y.o.   MRN: [id number]  HPI Patient arrives to discuss chronic abdominal pain issues.  Patient continues to note mid abdominal pain.  Please see prior notes.  Had progressive abdominal discomfort.  This past winter.  At that time scan revealed evidence of fat necrosis intra-abdominally.  Referral was made to Dr. Constance Haw.  At that time conservative course was recommended.  Patient was advised to return for follow-up and to get a repeat CT scan but the patient did not get this done.  I saw him recently for follow-up on his many chronic health concerns and he noted his mid abdominal pain return.  And now somewhat worse.  No acute fever no acute anorexia no change in bowel habits no vomiting.  Just persistent mid abdominal gnawing discomfort and pain very similar to the pain this winter which led to the diagnosis  Dr. Constance Haw is asked we help get the CT scan done before she reassesses and patient returns here for all the documentation that allows [place] to do that with insurance companies Patient also has a white coating on his tongue-sore   Review of Systems No headache, no major weight loss or weight gain, no chest pain no back pain abdominal pain no change in bowel habits complete ROS otherwise negative     Objective:   Physical Exam Alert and oriented, vitals reviewed and stable, NAD ENT-TM's and ext canals WNL bilat via otoscopic exam Soft palate, tonsils and post pharynx to reveal changes suggestive of thrush.  WNL via oropharyngeal exam Neck-symmetric, no masses; thyroid nonpalpable and nontender Pulmonary-no tachypnea or accessory muscle use; Clear without wheezes via auscultation Card--no abnrml murmurs, rhythm reg and rate WNL Carotid pulses symmetric, without bruits Abdomen voluminous diffuse mid abdominal tenderness to deep palpation.  Small umbilical hernia palpated no noticeable masses no point  tenderness       Assessment & Plan:  Impression 1 persistent and now recurrent mid abdominal pain very reminiscent of same pain this winter which was because apparently by fat necrosis.  With worsening of symptoms and surgeon's recommendation we will press on with CT scan which I think definitely is a good idea.  Patient will be following up with Dr. Constance Haw unless a different etiology not in any general surgery realm is revealed  2.  Thrush discussed Dukes Magic mouthwash prescribed  Greater than 50% of this 25 minute face to face visit was spent in counseling and discussion and coordination of care regarding the above diagnosis/diagnosies

## 2018-11-26 LAB — BASIC METABOLIC PANEL
BUN/Creatinine Ratio: 16 (ref 9–20)
BUN: 19 mg/dL (ref 6–24)
CO2: 23 mmol/L (ref 20–29)
Calcium: 9.7 mg/dL (ref 8.7–10.2)
Chloride: 103 mmol/L (ref 96–106)
Creatinine, Ser: 1.19 mg/dL (ref 0.76–1.27)
GFR calc Af Amer: 79 mL/min/{1.73_m2} (ref >59)
GFR calc non Af Amer: 68 mL/min/{1.73_m2} (ref >59)
Glucose: 121 mg/dL — ABNORMAL HIGH (ref 65–99)
Potassium: 4.6 mmol/L (ref 3.5–5.2)
Sodium: 140 mmol/L (ref 134–144)

## 2018-11-26 LAB — FERRITIN: Ferritin: 7 ng/mL — ABNORMAL LOW (ref 30–400)

## 2018-11-26 LAB — PSA: Prostate Specific Ag, Serum: 0.3 ng/mL (ref 0.0–4.0)

## 2018-11-26 LAB — HEPATIC FUNCTION PANEL
ALT: 18 IU/L (ref 0–44)
AST: 19 IU/L (ref 0–40)
Albumin: 4.3 g/dL (ref 3.8–4.9)
Alkaline Phosphatase: 51 IU/L (ref 39–117)
Bilirubin Total: 0.3 mg/dL (ref 0.0–1.2)
Bilirubin, Direct: 0.16 mg/dL (ref 0.00–0.40)
Total Protein: 6.5 g/dL (ref 6.0–8.5)

## 2018-11-26 LAB — LIPID PANEL
Chol/HDL Ratio: 4.6 ratio (ref 0.0–5.0)
Cholesterol, Total: 161 mg/dL (ref 100–199)
HDL: 35 mg/dL — ABNORMAL LOW (ref >39)
LDL Calculated: 82 mg/dL (ref 0–99)
Triglycerides: 219 mg/dL — ABNORMAL HIGH (ref 0–149)
VLDL Cholesterol Cal: 44 mg/dL — ABNORMAL HIGH (ref 5–40)

## 2018-11-26 LAB — FOLATE: Folate: 8.8 ng/mL (ref >3.0)

## 2018-11-26 LAB — VITAMIN B12: Vitamin B-12: 233 pg/mL (ref 232–1245)

## 2018-11-30 ENCOUNTER — Encounter: Payer: Self-pay | Admitting: Family Medicine

## 2018-12-07 ENCOUNTER — Telehealth: Payer: Self-pay | Admitting: Family Medicine

## 2018-12-07 DIAGNOSIS — K654 Sclerosing mesenteritis: Secondary | ICD-10-CM

## 2018-12-07 DIAGNOSIS — R1084 Generalized abdominal pain: Secondary | ICD-10-CM

## 2018-12-07 NOTE — Telephone Encounter (Signed)
CT scan was DENIED by insurance, please see denial letter  In yellow box on wall  Please advise

## 2018-12-08 ENCOUNTER — Telehealth: Payer: Self-pay | Admitting: *Deleted

## 2018-12-08 NOTE — Telephone Encounter (Signed)
Ct scan denied. See letter in your box.

## 2018-12-19 NOTE — Telephone Encounter (Signed)
Referral to GI ordered in Epic. Left message to return call to notify patient.

## 2018-12-19 NOTE — Telephone Encounter (Signed)
Patient notified

## 2018-12-19 NOTE — Telephone Encounter (Signed)
Ok pts ct abd has been refused by his insur co, with ongoing abd pain, rec office visit with gastroenteroogist re persistent abd pain

## 2018-12-20 DIAGNOSIS — E119 Type 2 diabetes mellitus without complications: Secondary | ICD-10-CM | POA: Diagnosis not present

## 2018-12-20 DIAGNOSIS — E669 Obesity, unspecified: Secondary | ICD-10-CM | POA: Diagnosis not present

## 2018-12-20 DIAGNOSIS — Z713 Dietary counseling and surveillance: Secondary | ICD-10-CM | POA: Diagnosis not present

## 2018-12-20 DIAGNOSIS — Z6836 Body mass index (BMI) 36.0-36.9, adult: Secondary | ICD-10-CM | POA: Diagnosis not present

## 2018-12-28 ENCOUNTER — Telehealth: Payer: Self-pay | Admitting: Family Medicine

## 2018-12-28 ENCOUNTER — Encounter: Payer: Self-pay | Admitting: Family Medicine

## 2018-12-28 NOTE — Telephone Encounter (Signed)
Diflucan 200 mg daily for 7 days

## 2018-12-28 NOTE — Telephone Encounter (Signed)
Patient still having problems with Thrush.  Has been treated twice, once here and once from nurse at work and nothing is helping. Is there a different medication that will help?

## 2018-12-29 MED ORDER — FLUCONAZOLE 200 MG PO TABS: ORAL_TABLET | ORAL | 0 refills | Status: DC

## 2018-12-29 NOTE — Telephone Encounter (Signed)
Medication sent in and pt is aware  

## 2019-01-07 ENCOUNTER — Other Ambulatory Visit: Payer: Self-pay | Admitting: Internal Medicine

## 2019-01-11 ENCOUNTER — Other Ambulatory Visit: Payer: Self-pay | Admitting: *Deleted

## 2019-01-11 ENCOUNTER — Telehealth: Payer: Self-pay | Admitting: Family Medicine

## 2019-01-11 NOTE — Telephone Encounter (Signed)
No further tele rec, I may or may not be able to give him anything else, but I can see him if he wants to come in

## 2019-01-11 NOTE — Telephone Encounter (Signed)
Patient states finished antibiotic and tongue is still white. Please advise.

## 2019-01-11 NOTE — Telephone Encounter (Signed)
Pt states he has completed two courses of dukes magic mouthwash one from dr Richardson Landry and one from his pa at work and also done diflucan for 7 days last week. States his tongue is white and sometimes he can scrape it off and sometimes not. He thinks it is something coming up from his stomach? Tongue does not hurt or bother him just white. Wants to know if there is something else he can try or does he need visit.

## 2019-01-11 NOTE — Telephone Encounter (Signed)
Dukes mm q one tblspoon swish and spit qid 16 oz

## 2019-01-12 ENCOUNTER — Other Ambulatory Visit: Payer: Self-pay

## 2019-01-12 ENCOUNTER — Ambulatory Visit: Payer: BC Managed Care – PPO | Admitting: Family Medicine

## 2019-01-12 ENCOUNTER — Encounter: Payer: Self-pay | Admitting: Family Medicine

## 2019-01-12 VITALS — BP 140/76 | Temp 97.6°F | Wt 278.0 lb

## 2019-01-12 DIAGNOSIS — K121 Other forms of stomatitis: Secondary | ICD-10-CM

## 2019-01-12 NOTE — Telephone Encounter (Signed)
Patient scheduled office visit with Dr Richardson Landry to discuss

## 2019-01-12 NOTE — Progress Notes (Signed)
   Subjective:    Patient ID: Austin Woods, male    DOB: Feb 13, 1966, 53 y.o.   MRN: [id number]  HPI Pt here today due to having thrush still. Pt has had "white stuff" on tongue for 4 weeks. Pt states he has tried Diflucan for 7 days and magic mouthwash. Pt states he also saw his PA at work and she gave him some medications. Pt states he can brush his tongue and it will come off. Pt does get a dry mouth when eating.   See prior notes.  Patient frustrated about the whitish color to his tongue.  Has been through multiple therapies.  Still ongoing.  States at times he feels he can scrape some of this off Review of Systems No headache, no major weight loss or weight gain, no chest pain no back pain abdominal pain no change in bowel habits complete ROS otherwise negative     Objective:   Physical Exam Alert vitals stable, NAD. Blood pressure good on repeat. HEENT normal. Lungs clear. Heart regular rate and rhythm. Lateral tongue both sides slight white cast but not leukoplakia and not patchy and not inflamed and no evidence of glossitis in fact within normal limits       Assessment & Plan:  Impression normal whitish colored to tongue discussed recommend no further therapy.  If patient wishes to see oral surgeon or follow-up with his dentist he certainly can

## 2019-01-14 ENCOUNTER — Other Ambulatory Visit: Payer: Self-pay | Admitting: Family Medicine

## 2019-01-14 ENCOUNTER — Other Ambulatory Visit: Payer: Self-pay | Admitting: Cardiovascular Disease

## 2019-01-17 DIAGNOSIS — Z23 Encounter for immunization: Secondary | ICD-10-CM | POA: Diagnosis not present

## 2019-01-23 ENCOUNTER — Other Ambulatory Visit: Payer: Self-pay | Admitting: Cardiovascular Disease

## 2019-01-24 DIAGNOSIS — J45909 Unspecified asthma, uncomplicated: Secondary | ICD-10-CM | POA: Diagnosis not present

## 2019-01-24 DIAGNOSIS — Z008 Encounter for other general examination: Secondary | ICD-10-CM | POA: Diagnosis not present

## 2019-01-24 DIAGNOSIS — E119 Type 2 diabetes mellitus without complications: Secondary | ICD-10-CM | POA: Diagnosis not present

## 2019-01-24 DIAGNOSIS — Z719 Counseling, unspecified: Secondary | ICD-10-CM | POA: Diagnosis not present

## 2019-02-02 ENCOUNTER — Telehealth: Payer: Self-pay | Admitting: Internal Medicine

## 2019-02-02 NOTE — Telephone Encounter (Signed)
Virtual Visit Pre-Appointment Phone Call  "(Name), I am calling you today to discuss your upcoming appointment. We are currently trying to limit exposure to the virus that causes COVID-19 by seeing patients at home rather than in the office."  1. "What is the BEST phone number to call the day of the visit?" - include this in appointment notes  2. Do you have or have access to (through a family member/friend) a smartphone with video capability that we can use for your visit?" a. If yes - list this number in appt notes as cell (if different from BEST phone #) and list the appointment type as a VIDEO visit in appointment notes b. If no - list the appointment type as a PHONE visit in appointment notes  3. Confirm consent - "In the setting of the current Covid19 crisis, you are scheduled for a (phone or video) visit with your provider on (date) at (time).  Just as we do with many in-office visits, in order for you to participate in this visit, we must obtain consent.  If you'd like, I can send this to your mychart (if signed up) or email for you to review.  Otherwise, I can obtain your verbal consent now.  All virtual visits are billed to your insurance company just like a normal visit would be.  By agreeing to a virtual visit, we'd like you to understand that the technology does not allow for your provider to perform an examination, and thus may limit your provider's ability to fully assess your condition. If your provider identifies any concerns that need to be evaluated in person, we will make arrangements to do so.  Finally, though the technology is pretty good, we cannot assure that it will always work on either your or our end, and in the setting of a video visit, we may have to convert it to a phone-only visit.  In either situation, we cannot ensure that we have a secure connection.  Are you willing to proceed?" STAFF: Did the patient verbally acknowledge consent to telehealth visit? Document  YES/NO here: yes  4. Advise patient to be prepared - "Two hours prior to your appointment, go ahead and check your blood pressure, pulse, oxygen saturation, and your weight (if you have the equipment to check those) and write them all down. When your visit starts, your provider will ask you for this information. If you have an Apple Watch or Kardia device, please plan to have heart rate information ready on the day of your appointment. Please have a pen and paper handy nearby the day of the visit as well."  5. Give patient instructions for MyChart download to smartphone OR [email or web address] as below if video visit (depending on what platform provider is using)  6. Inform patient they will receive a phone call 15 minutes prior to their appointment time (may be from unknown caller ID) so they should be prepared to answer    Austin Woods has been deemed a candidate for a follow-up tele-health visit to limit community exposure during the Covid-19 pandemic. I spoke with the patient via phone to ensure availability of phone/video source, confirm preferred email & phone number, and discuss instructions and expectations.  I reminded Austin Woods to be prepared with any vital sign and/or heart rhythm information that could potentially be obtained via home monitoring, at the time of his visit. I reminded Austin Woods to expect a phone call prior to  his visit.  Austin Woods 02/02/2019 10:29 AM   INSTRUCTIONS FOR DOWNLOADING THE MYCHART APP TO SMARTPHONE  - The patient must first make sure to have activated MyChart and know their login information - If Apple, go to [employer] and type in MyChart in the search bar and download the app. If Android, ask patient to go to [employer] and type in [place] in the search bar and download the app. The app is free but as with any other app downloads, their phone may require them to verify saved payment information or  Apple/Android password.  - The patient will need to then log into the app with their MyChart username and password, and select Austin Woods as their healthcare provider to link the account. When it is time for your visit, go to the MyChart app, find appointments, and click Begin Video Visit. Be sure to Select Allow for your device to access the Microphone and Camera for your visit. You will then be connected, and your provider will be with you shortly.  **If they have any issues connecting, or need assistance please contact MyChart service desk [phone])**  **If using a computer, in order to ensure the best quality for their visit they will need to use either of the following Internet Browsers: [employer], or Google Chrome**  IF USING DOXIMITY or DOXY.ME - The patient will receive a link just prior to their visit by text.     FULL LENGTH CONSENT FOR TELE-HEALTH VISIT   I hereby voluntarily request, consent and authorize [facility] and its employed or contracted physicians, physician assistants, nurse practitioners or other licensed health care professionals (the Practitioner), to provide me with telemedicine health care services (the Services") as deemed necessary by the treating Practitioner. I acknowledge and consent to receive the Services by the Practitioner via telemedicine. I understand that the telemedicine visit will involve communicating with the Practitioner through live audiovisual communication technology and the disclosure of certain medical information by electronic transmission. I acknowledge that I have been given the opportunity to request an in-person assessment or other available alternative prior to the telemedicine visit and am voluntarily participating in the telemedicine visit.  I understand that I have the right to withhold or withdraw my consent to the use of telemedicine in the course of my care at any time, without affecting my right to future care  or treatment, and that the Practitioner or I may terminate the telemedicine visit at any time. I understand that I have the right to inspect all information obtained and/or recorded in the course of the telemedicine visit and may receive copies of available information for a reasonable fee.  I understand that some of the potential risks of receiving the Services via telemedicine include:   Delay or interruption in medical evaluation due to technological equipment failure or disruption;  Information transmitted may not be sufficient (e.g. poor resolution of images) to allow for appropriate medical decision making by the Practitioner; and/or   In rare instances, security protocols could fail, causing a breach of personal health information.  Furthermore, I acknowledge that it is my responsibility to provide information about my medical history, conditions and care that is complete and accurate to the best of my ability. I acknowledge that Practitioner's advice, recommendations, and/or decision may be based on factors not within their control, such as incomplete or inaccurate data provided by me or distortions of diagnostic images or specimens that may result from electronic transmissions. I  understand that the practice of medicine is not an exact science and that Practitioner makes no warranties or guarantees regarding treatment outcomes. I acknowledge that I will receive a copy of this consent concurrently upon execution via email to the email address I last provided but may also request a printed copy by calling the office of [facility].    I understand that my insurance will be billed for this visit.   I have read or had this consent read to me.  I understand the contents of this consent, which adequately explains the benefits and risks of the Services being provided via telemedicine.   I have been provided ample opportunity to ask questions regarding this consent and the Services and have had  my questions answered to my satisfaction.  I give my informed consent for the services to be provided through the use of telemedicine in my medical care  By participating in this telemedicine visit I agree to the above.

## 2019-02-07 DIAGNOSIS — Z6835 Body mass index (BMI) 35.0-35.9, adult: Secondary | ICD-10-CM | POA: Diagnosis not present

## 2019-02-07 DIAGNOSIS — E669 Obesity, unspecified: Secondary | ICD-10-CM | POA: Diagnosis not present

## 2019-02-07 DIAGNOSIS — Z7689 Persons encountering health services in other specified circumstances: Secondary | ICD-10-CM | POA: Diagnosis not present

## 2019-02-09 ENCOUNTER — Ambulatory Visit (INDEPENDENT_AMBULATORY_CARE_PROVIDER_SITE_OTHER): Payer: BC Managed Care – PPO | Admitting: Nurse Practitioner

## 2019-02-09 ENCOUNTER — Encounter (INDEPENDENT_AMBULATORY_CARE_PROVIDER_SITE_OTHER): Payer: Self-pay | Admitting: *Deleted

## 2019-02-09 ENCOUNTER — Other Ambulatory Visit: Payer: Self-pay

## 2019-02-09 ENCOUNTER — Encounter (INDEPENDENT_AMBULATORY_CARE_PROVIDER_SITE_OTHER): Payer: Self-pay | Admitting: Nurse Practitioner

## 2019-02-09 VITALS — BP 129/72 | HR 79 | Temp 97.9°F | Ht 73.0 in | Wt 273.3 lb

## 2019-02-09 DIAGNOSIS — K219 Gastro-esophageal reflux disease without esophagitis: Secondary | ICD-10-CM

## 2019-02-09 DIAGNOSIS — R1010 Upper abdominal pain, unspecified: Secondary | ICD-10-CM | POA: Diagnosis not present

## 2019-02-09 DIAGNOSIS — R110 Nausea: Secondary | ICD-10-CM | POA: Insufficient documentation

## 2019-02-09 DIAGNOSIS — D649 Anemia, unspecified: Secondary | ICD-10-CM | POA: Insufficient documentation

## 2019-02-09 DIAGNOSIS — R140 Abdominal distension (gaseous): Secondary | ICD-10-CM | POA: Diagnosis not present

## 2019-02-09 MED ORDER — FAMOTIDINE 20 MG PO TABS: 20.0000 mg | ORAL_TABLET | Freq: Every day | ORAL | 0 refills | Status: DC

## 2019-02-09 NOTE — Patient Instructions (Signed)
1. Complete the provided lab order  2. Schedule an abdominal sonogram  3. An upper endoscopy and colonoscopy to be scheduled after I review your lab results.   4. Gas x 1 tab by mouth twice daily. Phillip's bacteria probiotic once daily.  5. Famotidine 20mg  one tab at bed time. Continue Protonix 40mg  in the am.  6. Follow up in office after EGD and colonoscopy completed

## 2019-02-09 NOTE — Progress Notes (Signed)
Subjective:    Patient ID: Austin Woods, male    DOB: 29-Nov-1965, 53 y.o.   MRN: [id number]  HPI Austin Woods. Marsalis is a 53 year old male with a past medical history significant for asthma, hypertension, coronary artery disease 2017, coronary stent, cardiomyopathy, WPW, atrial fibrillation, LLE DVT on Xarelto and ASA, diabetes mellitus type 2, sleep apnea uses CPAP, colon polyps and GERD. S/P Nissen Fundoplication 20 years ago.  He presents today with complaints of having abdominal bloat and tightness which started one year ago.  Abdominal/pelvic CT 02/02/2018 showed an area of fat necrosis to the left lower abdomen and a small umbilical hernia containing fat.  He also has some nauseas in the early morning. Acid backs up in his mouth most morning. He doesn't feel heartburn just acid taste in his mouth. He has a white coating on his tongue. He was treated with Diflucan for possible thrush without improvement.  He takes Protonix in the morning.  He sometimes has difficulty swallowing pills. No difficulty swallowing food or liquids. He is passing a normal brown soft stool most days.  If he eats a salad or greasy foods he develops watery diarrhea.  No rectal bleeding or melena.  He underwent a colonoscopy 12/04/2015 which identified to 2 tubular adenomatous polyps and diverticulosis at the hepatic flexure.  He underwent an EGD prior to his Nissan fundoplication which showed GERD.  No family history of upper GI or colorectal cancer.  CBC Latest Ref Rng & Units 10/14/2018 12/13/2017 06/02/2017  WBC 4.0 - 10.5 K/uL 8.8 10.3 9.7  Hemoglobin 13.0 - 17.0 g/dL 11.8(L) 14.9 15.0  Hematocrit 39.0 - 52.0 % 38.4(L) 45.4 44.2  Platelets 150 - 400 K/uL 370 296 284   CMP Latest Ref Rng & Units 11/25/2018 10/14/2018 12/13/2017  Glucose 65 - 99 mg/dL 121(H) 106(H) 142(H)  BUN 6 - 24 mg/dL 19 23(H) 13  Creatinine 0.76 - 1.27 mg/dL 1.19 1.28(H) 1.17  Sodium 134 - 144 mmol/L 140 140 139  Potassium 3.5 - 5.2 mmol/L 4.6 4.4 4.2   Chloride 96 - 106 mmol/L 103 104 100  CO2 20 - 29 mmol/L _0 Calcium 8.7 - 10.2 mg/dL 9.7 9.1 9.7  Total Protein 6.0 - 8.5 g/dL 6.5 - 7.9  Total Bilirubin 0.0 - 1.2 mg/dL 0.3 - 1.4(H)  Alkaline Phos 39 - 117 IU/L 51 - 45  AST 0 - 40 IU/L 19 - 18  ALT 0 - 44 IU/L 18 - 22    An abdominal/pelvic CT was done in [facility] 02/02/2018:  1. 7 cm area of fat necrosis within the LOWER LEFT abdomen likely accounting for this patient's abdominal pain. No imaging follow-up of this area recommended. 2. Small umbilical hernia containing fat 3.  Aortic Atherosclerosis   Colonoscopy 12/04/2015:  - Two 4 to 5 mm tubular adenomatous polyps in the proximal sigmoid colon and at the hepatic flexure, removed with a cold snare. Resected and retrieved. Diverticulosis at the hepatic flexure. - External hemorrhoids -Repeat colonoscopy 5 years.   Past Medical History:  Diagnosis Date  . A-fib ([place])   . Arthritis   . Asthma   . Coronary artery disease   . Dysrhythmia   . GERD (gastroesophageal reflux disease)   . Headache   . High cholesterol   . History of DVT (deep vein thrombosis)    a. left leg following ablation for SVT  . Hypertension   . Persistent atrial fibrillation ([place])    a. Dx  08/2012, xarelto initiated; b. 09/2012 Tikosyn initiated -> DCCV -> Sinus c. PVI 03/2013 d. PVI 03/2014  . Sleep apnea    USES  CPAP   . Type 2 diabetes mellitus without complication, without long-term current use of insulin ([place]) 02/16/2018  . Wolff-Parkinson-White (WPW) syndrome    a. s/p RFCA 2001 by Dr Caryl Comes, pt reports complicated by post procedure DVT   Past Surgical History:  Procedure Laterality Date  . ATRIAL FIBRILLATION ABLATION  03/23/2013   PVI by DR Rayann Heman for afib  . ATRIAL FIBRILLATION ABLATION N/A 03/23/2013   Procedure: ATRIAL FIBRILLATION ABLATION;  Surgeon: Coralyn Mark, MD;  Location: [place] CATH LAB;  Service: Cardiovascular;  Laterality: N/A;  . ATRIAL FIBRILLATION ABLATION N/A  03/27/2014   PVI Dr Rayann Heman  . BACK SURGERY    . CARDIAC CATHETERIZATION N/A 02/03/2016   Procedure: Left Heart Cath and Coronary Angiography;  Surgeon: Nelva Bush, MD;  Location: [place] CV LAB;  Service: Cardiovascular;  Laterality: N/A;  . CARDIAC CATHETERIZATION N/A 02/03/2016   Procedure: Intravascular Pressure Wire/FFR Study;  Surgeon: Nelva Bush, MD;  Location: [place] CV LAB;  Service: Cardiovascular;  Laterality: N/A;  . CARDIAC CATHETERIZATION N/A 02/03/2016   Procedure: Coronary Stent Intervention;  Surgeon: Nelva Bush, MD;  Location: [place] CV LAB;  Service: Cardiovascular;  Laterality: N/A;  . CARDIAC SURGERY    . CARDIOVERSION N/A 08/25/2012   Procedure: CARDIOVERSION;  Surgeon: Thayer Headings, MD;  Location: [facility] ENDOSCOPY;  Service: Cardiovascular;  Laterality: N/A;  . CARDIOVERSION N/A 09/06/2012   Procedure: CARDIOVERSION/Bedside;  Surgeon: Carlena Bjornstad, MD;  Location: [place];  Service: Cardiovascular;  Laterality: N/A;  . CARDIOVERSION N/A 01/12/2014   Procedure: CARDIOVERSION;  Surgeon: Pixie Casino, MD;  Location: [facility] ENDOSCOPY;  Service: Cardiovascular;  Laterality: N/A;  . CARDIOVERSION N/A 01/30/2016   Procedure: CARDIOVERSION;  Surgeon: Sueanne Margarita, MD;  Location: [facility] ENDOSCOPY;  Service: Cardiovascular;  Laterality: N/A;  . CARDIOVERSION N/A 04/14/2016   Procedure: CARDIOVERSION;  Surgeon: Pixie Casino, MD;  Location: [facility] ENDOSCOPY;  Service: Cardiovascular;  Laterality: N/A;  . COLONOSCOPY N/A 12/04/2015   Procedure: COLONOSCOPY;  Surgeon: Rogene Houston, MD;  Location: [facility]O [facility];  Service: Endoscopy;  Laterality: N/A;  730  . CORONARY ANGIOPLASTY    . CORONARY ANGIOPLASTY WITH STENT PLACEMENT    . ELECTROPHYSIOLOGIC STUDY N/A 02/18/2016   Procedure: Atrial Fibrillation Ablation;  Surgeon: Thompson Grayer, MD;  Location: [place] CV LAB;  Service: Cardiovascular;  Laterality: N/A;  . INGUINAL HERNIA REPAIR Bilateral 06/04/2017    Procedure: OPEN BILATERAL INGUINAL HERNIA REPAIR;  Surgeon: Judeth Horn, MD;  Location: [place];  Service: General;  Laterality: Bilateral;  . LOOP RECORDER IMPLANT N/A 06/29/2014   Procedure: LOOP RECORDER IMPLANT;  Surgeon: Thompson Grayer, MD;  Location: [facility] CATH LAB;  Service: Cardiovascular;  Laterality: N/A;  . NOSE SURGERY     sleep apnea surgery  . PERIPHERAL VASCULAR CATHETERIZATION N/A 03/13/2016   Procedure: Thrombin Injection;  Surgeon: Serafina Mitchell, MD;  Location: [place] CV LAB;  Service: Cardiovascular;  Laterality: N/A;  . STOMACH SURGERY     reflux, fundoplication  . TEE WITHOUT CARDIOVERSION N/A 08/25/2012   Procedure: TRANSESOPHAGEAL ECHOCARDIOGRAM (TEE);  Surgeon: Thayer Headings, MD;  Location: [place];  Service: Cardiovascular;  Laterality: N/A;  . TEE WITHOUT CARDIOVERSION  03/23/2013   DR ROSS  . TEE WITHOUT CARDIOVERSION N/A 03/23/2013   Procedure: TRANSESOPHAGEAL ECHOCARDIOGRAM (TEE);  Surgeon: Nevin Bloodgood  Alcus Dad, MD;  Location: [facility] ENDOSCOPY;  Service: Cardiovascular;  Laterality: N/A;  . TEE WITHOUT CARDIOVERSION N/A 03/26/2014   Procedure: TRANSESOPHAGEAL ECHOCARDIOGRAM (TEE);  Surgeon: Josue Hector, MD;  Location: [facility] ENDOSCOPY;  Service: Cardiovascular;  Laterality: N/A;  . TONSILLECTOMY     Current Outpatient Medications on File Prior to Visit  Medication Sig Dispense Refill  . acetaminophen (TYLENOL) 500 MG tablet Take 1,000 mg by mouth every 6 (six) hours as needed for headache.    . albuterol (PROVENTIL HFA) 108 (90 Base) MCG/ACT inhaler Inhale 2 puffs into the lungs every 4 (four) hours as needed for wheezing or shortness of breath. 1 Inhaler 0  . atorvastatin (LIPITOR) 10 MG tablet Take 1 tablet (10 mg total) by mouth daily. 30 tablet 5  . azelastine (ASTELIN) 0.1 % nasal spray Place 2 sprays into both nostrils 2 (two) times daily. 30 mL 12  . blood glucose meter kit and supplies Dispense based on patient and insurance preference. Use to check blood sugar  once a day(FOR ICD-10 E10.9, E11.9). 1 each 0  . chlorzoxazone (PARAFON) 500 MG tablet Take 1 tablet (500 mg total) by mouth 3 (three) times daily as needed for muscle spasms. 36 tablet 2  . Cholecalciferol (VITAMIN D) 2000 UNITS CAPS Take 4,000 Units by mouth daily.     . cyanocobalamin 100 MCG tablet Take 100 mcg by mouth daily.    Marland Kitchen dofetilide (TIKOSYN) 500 MCG capsule TAKE 1 CAPSULE BY MOUTH TWO TIMES DAILY 180 capsule 3  . enalapril (VASOTEC) 10 MG tablet Take 1 tablet (10 mg total) by mouth 2 (two) times daily. 180 tablet 3  . fluconazole (DIFLUCAN) 200 MG tablet Take one tablet by mouth daily for 7 days (Patient not taking: Reported on 01/12/2019) 7 tablet 0  . Fluticasone-Salmeterol (ADVAIR DISKUS) 250-50 MCG/DOSE AEPB Inhale 1 puff into the lungs every 12 (twelve) hours. 60 each 0  . furosemide (LASIX) 40 MG tablet TAKE 1 TABLET BY MOUTH  DAILY 90 tablet 1  . hydrOXYzine (VISTARIL) 25 MG capsule Take 2 capsules (50 mg total) by mouth 3 (three) times daily as needed for anxiety. 30 capsule 0  . ketoconazole (NIZORAL) 2 % cream Apply 1 application topically 2 (two) times daily. 30 g 0  . magic mouthwash SOLN Take 15 mLs by mouth 4 (four) times daily. Swish and spit (Patient not taking: Reported on 01/12/2019) 480 mL 0  . MAGNESIUM PO Take 500 mg by mouth daily.     . metFORMIN (GLUCOPHAGE) 1000 MG tablet TAKE 1 TABLET BY MOUTH IN  THE MORNING FOR 7 DAYS THEN 2 TABLETS BY MOUTH IN THE  MORNING 180 tablet 0  . metoprolol succinate (TOPROL-XL) 100 MG 24 hr tablet Take 50 mg by mouth daily 45 tablet 3  . nitroGLYCERIN (NITROSTAT) 0.4 MG SL tablet DISSOLVE 1 TABLET UNDER THE TONGUE EVERY 5 MINUTES AS  NEEDED FOR CHEST PAIN. MAX  OF 3 TABLETS IN 15 MINUTES. CALL 911 IF PAIN PERSISTS. 75 tablet 3  . ondansetron (ZOFRAN ODT) 4 MG disintegrating tablet Take 1 tablet (4 mg total) by mouth every 8 (eight) hours as needed for nausea or vomiting. 20 tablet 0  . oxybutynin (DITROPAN) 5 MG tablet TAKE  ONE-HALF TABLET (2.61m) BY  MOUTH TWO TIMES DAILY AS  NEEDED FOR BLADDER SPASMS 30 tablet 5  . pantoprazole (PROTONIX) 40 MG tablet TAKE 1 TABLET (459m BY MOUTH  DAILY 90 tablet 2  . potassium chloride SA (K-DUR,KLOR-CON) 20  MEQ tablet TAKE 1 TABLET (47m) BY MOUTH  EVERY DAY 30 tablet 5  . PROCTO-MED HC 2.5 % rectal cream APPLY 3 TIMES DAILY AS  DIRECTED 90 g 0  . tiZANidine (ZANAFLEX) 4 MG capsule Take one three times a day as needed for spasms 30 capsule 0  . XARELTO 20 MG TABS tablet TAKE 1 TABLET BY MOUTH ONCE DAILY WITH SUPPER 30 tablet 0   No current facility-administered medications on file prior to visit.    No Known Allergies  Family History: Father CAD, DM, deceased age 55 Mother died at the age of 67cause unknown. 2 Brothers  one with DM, liver disease.   Social History: married,  Daughter age [id number] son age [id number] Quit smoking 8 years ago. No alcohol for 5 to 6 years. No drugs use.  He works the night shift as an [employer]   Review of Systems HPI, all other systems reviewed and are negative     Objective:   Physical Exam  BP 129/72   Pulse 79   Temp 97.9 F (36.6 C)   Ht _0  (1.854 m)   Wt 273 lb 4.8 oz (124 kg)   BMI 36.06 kg/m  General: 53year old male well-developed in no acute distress Eyes: Sclera nonicteric, conjunctiva pink Mouth: No ulcers or lesions Neck: Supple, no lymphadenopathy Heart: Regular rate and rhythm, no murmurs Lungs: Breath sounds clear throughout Abdomen: Protuberant, soft, nontender, positive bowel sounds all 4 quadrants, no HSM, diastasis recti present, umbilical hernia present Extremities: No edema Neuro: Alert and oriented x4, no focal deficits    Assessment & Plan:   89  53year old male with upper abdominal bloat/tightness with mild a.m. nausea -Abdominal sonogram to evaluate the gallbladder -CBC, CMP and CRP -Gas-X 1 tab twice daily, Phillips bacteria probiotic  2.  History of GERD, past Nissan fundoplication -EGD events  and risk discussed including risk with sedation, bleeding and perforation -Continue Protonix 40 mg in the morning.  Add Famotidine 20 mg at bedtime.  3.  Normocytic anemia.  Labs 10/14/2018 with a hemoglobin 11.8.  Hematocrit 38.4.  MCV 87.1.  8/21 ferritin 7.  Vitamin B12 233. -CBC, CMP, CRP, lipase, iron panel.  No obvious signs of GI bleeding.  Heme slides 11/18/2018 were negative.  -If the above labs confirm iron deficiency anemia the patient will require an EGD and colonoscopy with cardiac clearance.   4.  History of tubular adenomatous polyps  5.  Significant cardiac history including coronary artery disease, stent 2017, atrial fibrillation, DVT on Xarelto and ASA

## 2019-02-10 ENCOUNTER — Telehealth (INDEPENDENT_AMBULATORY_CARE_PROVIDER_SITE_OTHER): Payer: BC Managed Care – PPO | Admitting: Internal Medicine

## 2019-02-10 ENCOUNTER — Encounter: Payer: Self-pay | Admitting: Internal Medicine

## 2019-02-10 VITALS — BP 129/72 | HR 79 | Ht 73.0 in | Wt 273.0 lb

## 2019-02-10 DIAGNOSIS — I25118 Atherosclerotic heart disease of native coronary artery with other forms of angina pectoris: Secondary | ICD-10-CM | POA: Diagnosis not present

## 2019-02-10 DIAGNOSIS — I4819 Other persistent atrial fibrillation: Secondary | ICD-10-CM

## 2019-02-10 DIAGNOSIS — G4733 Obstructive sleep apnea (adult) (pediatric): Secondary | ICD-10-CM | POA: Diagnosis not present

## 2019-02-10 DIAGNOSIS — I10 Essential (primary) hypertension: Secondary | ICD-10-CM | POA: Diagnosis not present

## 2019-02-10 NOTE — Patient Instructions (Addendum)
Medication Instructions:  Continue all current medications.  Labwork: none  Testing/Procedures: none  Follow-Up: In office on 03/01/19 for EKG & loop explant.   Any Other Special Instructions Will Be Listed Below (If Applicable).  If you need a refill on your cardiac medications before your next appointment, please call your pharmacy.

## 2019-02-10 NOTE — Progress Notes (Signed)
Electrophysiology TeleHealth Note   Due to national recommendations of social distancing due to COVID 19, an audio/video telehealth visit is felt to be most appropriate for this patient at this time.  See MyChart message from today for the patient's consent to telehealth for [facility].   Date:  02/10/2019   ID:  Austin Woods, DOB 11/18/1965, MRN [id number]  Location: patient's home  Provider location:  [facility]  Evaluation Performed: Follow-up visit  PCP:  [name], MD   Electrophysiologist:  Dr Rayann Heman  Chief Complaint:  AF follow up  History of Present Illness:    Austin Woods is a 53 y.o. male who presents via telehealth conferencing today.  Since last being seen in our clinic, the patient reports doing relatively well.  He has been having some increased shortness of breath recently.  He saw [place]. Echo demonstrated normal LVEF.  He also has abdominal bloating and tightness with nausea.  Plan for EGD with GI.    Today, he denies symptoms of palpitations, chest pain,  lower extremity edema, dizziness, presyncope, or syncope.  The patient is otherwise without complaint today.  The patient denies symptoms of fevers, chills, cough, or new SOB worrisome for COVID 19.  Past Medical History:  Diagnosis Date  . A-fib ([place])   . Arthritis   . Asthma   . Coronary artery disease   . Dysrhythmia   . GERD (gastroesophageal reflux disease)   . Headache   . High cholesterol   . History of DVT (deep vein thrombosis)    a. left leg following ablation for SVT  . Hypertension   . Persistent atrial fibrillation ([place])    a. Dx 08/2012, xarelto initiated; b. 09/2012 Tikosyn initiated -> DCCV -> Sinus c. PVI 03/2013 d. PVI 03/2014  . Sleep apnea    USES  CPAP   . Type 2 diabetes mellitus without complication, without long-term current use of insulin ([place]) 02/16/2018  . Wolff-Parkinson-White (WPW) syndrome    a. s/p RFCA 2001 by Dr Caryl Comes, pt reports  complicated by post procedure DVT    Past Surgical History:  Procedure Laterality Date  . ATRIAL FIBRILLATION ABLATION  03/23/2013   PVI by DR Rayann Heman for afib  . ATRIAL FIBRILLATION ABLATION N/A 03/23/2013   Procedure: ATRIAL FIBRILLATION ABLATION;  Surgeon: Coralyn Mark, MD;  Location: [place] CATH LAB;  Service: Cardiovascular;  Laterality: N/A;  . ATRIAL FIBRILLATION ABLATION N/A 03/27/2014   PVI Dr Rayann Heman  . BACK SURGERY    . CARDIAC CATHETERIZATION N/A 02/03/2016   Procedure: Left Heart Cath and Coronary Angiography;  Surgeon: Nelva Bush, MD;  Location: [place] CV LAB;  Service: Cardiovascular;  Laterality: N/A;  . CARDIAC CATHETERIZATION N/A 02/03/2016   Procedure: Intravascular Pressure Wire/FFR Study;  Surgeon: Nelva Bush, MD;  Location: [place] CV LAB;  Service: Cardiovascular;  Laterality: N/A;  . CARDIAC CATHETERIZATION N/A 02/03/2016   Procedure: Coronary Stent Intervention;  Surgeon: Nelva Bush, MD;  Location: [place] CV LAB;  Service: Cardiovascular;  Laterality: N/A;  . CARDIAC SURGERY    . CARDIOVERSION N/A 08/25/2012   Procedure: CARDIOVERSION;  Surgeon: Thayer Headings, MD;  Location: [facility] ENDOSCOPY;  Service: Cardiovascular;  Laterality: N/A;  . CARDIOVERSION N/A 09/06/2012   Procedure: CARDIOVERSION/Bedside;  Surgeon: Carlena Bjornstad, MD;  Location: [place];  Service: Cardiovascular;  Laterality: N/A;  . CARDIOVERSION N/A 01/12/2014   Procedure: CARDIOVERSION;  Surgeon: Pixie Casino, MD;  Location: [facility]  ENDOSCOPY;  Service: Cardiovascular;  Laterality: N/A;  . CARDIOVERSION N/A 01/30/2016   Procedure: CARDIOVERSION;  Surgeon: Sueanne Margarita, MD;  Location: [place] ENDOSCOPY;  Service: Cardiovascular;  Laterality: N/A;  . CARDIOVERSION N/A 04/14/2016   Procedure: CARDIOVERSION;  Surgeon: Pixie Casino, MD;  Location: [facility] ENDOSCOPY;  Service: Cardiovascular;  Laterality: N/A;  . COLONOSCOPY N/A 12/04/2015   Procedure: COLONOSCOPY;  Surgeon: Rogene Houston,  MD;  [facility]O [facility];  Service: Endoscopy;  Laterality: N/A;  730  . CORONARY ANGIOPLASTY    . CORONARY ANGIOPLASTY WITH STENT PLACEMENT    . ELECTROPHYSIOLOGIC STUDY N/A 02/18/2016   Procedure: Atrial Fibrillation Ablation;  Surgeon: Thompson Grayer, MD;  Location: [place] CV LAB;  Service: Cardiovascular;  Laterality: N/A;  . INGUINAL HERNIA REPAIR Bilateral 06/04/2017   Procedure: OPEN BILATERAL INGUINAL HERNIA REPAIR;  Surgeon: Judeth Horn, MD;  Location: [place];  Service: General;  Laterality: Bilateral;  . LOOP RECORDER IMPLANT N/A 06/29/2014   Procedure: LOOP RECORDER IMPLANT;  Surgeon: Thompson Grayer, MD;  Location: [facility] CATH LAB;  Service: Cardiovascular;  Laterality: N/A;  . NOSE SURGERY     sleep apnea surgery  . PERIPHERAL VASCULAR CATHETERIZATION N/A 03/13/2016   Procedure: Thrombin Injection;  Surgeon: Serafina Mitchell, MD;  Location: [place] CV LAB;  Service: Cardiovascular;  Laterality: N/A;  . STOMACH SURGERY     reflux, fundoplication  . TEE WITHOUT CARDIOVERSION N/A 08/25/2012   Procedure: TRANSESOPHAGEAL ECHOCARDIOGRAM (TEE);  Surgeon: Thayer Headings, MD;  Location: [place];  Service: Cardiovascular;  Laterality: N/A;  . TEE WITHOUT CARDIOVERSION  03/23/2013   DR ROSS  . TEE WITHOUT CARDIOVERSION N/A 03/23/2013   Procedure: TRANSESOPHAGEAL ECHOCARDIOGRAM (TEE);  Surgeon: Fay Records, MD;  Location: [facility] ENDOSCOPY;  Service: Cardiovascular;  Laterality: N/A;  . TEE WITHOUT CARDIOVERSION N/A 03/26/2014   Procedure: TRANSESOPHAGEAL ECHOCARDIOGRAM (TEE);  Surgeon: Josue Hector, MD;  Location: [facility] ENDOSCOPY;  Service: Cardiovascular;  Laterality: N/A;  . TONSILLECTOMY      Current Outpatient Medications  Medication Sig Dispense Refill  . acetaminophen (TYLENOL) 500 MG tablet Take 1,000 mg by mouth every 6 (six) hours as needed for headache.    . albuterol (PROVENTIL HFA) 108 (90 Base) MCG/ACT inhaler Inhale 2 puffs into the lungs every 4 (four) hours as needed for  wheezing or shortness of breath. 1 Inhaler 0  . atorvastatin (LIPITOR) 10 MG tablet Take 1 tablet (10 mg total) by mouth daily. 30 tablet 5  . azelastine (ASTELIN) 0.1 % nasal spray Place 2 sprays into both nostrils 2 (two) times daily. 30 mL 12  . blood glucose meter kit and supplies Dispense based on patient and insurance preference. Use to check blood sugar once a day(FOR ICD-10 E10.9, E11.9). 1 each 0  . chlorzoxazone (PARAFON) 500 MG tablet Take 1 tablet (500 mg total) by mouth 3 (three) times daily as needed for muscle spasms. 36 tablet 2  . Cholecalciferol (VITAMIN D) 2000 UNITS CAPS Take 4,000 Units by mouth daily.     . cyanocobalamin 100 MCG tablet Take 100 mcg by mouth daily.    Marland Kitchen dofetilide (TIKOSYN) 500 MCG capsule TAKE 1 CAPSULE BY MOUTH TWO TIMES DAILY 180 capsule 3  . enalapril (VASOTEC) 10 MG tablet Take 1 tablet (10 mg total) by mouth 2 (two) times daily. 180 tablet 3  . famotidine (PEPCID) 20 MG tablet Take 1 tablet (20 mg total) by mouth at bedtime. 30 tablet 0  . fluconazole (DIFLUCAN) 200  MG tablet Take one tablet by mouth daily for 7 days 7 tablet 0  . Fluticasone-Salmeterol (ADVAIR DISKUS) 250-50 MCG/DOSE AEPB Inhale 1 puff into the lungs every 12 (twelve) hours. 60 each 0  . furosemide (LASIX) 40 MG tablet TAKE 1 TABLET BY MOUTH  DAILY 90 tablet 1  . hydrOXYzine (VISTARIL) 25 MG capsule Take 2 capsules (50 mg total) by mouth 3 (three) times daily as needed for anxiety. 30 capsule 0  . ketoconazole (NIZORAL) 2 % cream Apply 1 application topically 2 (two) times daily. 30 g 0  . magic mouthwash SOLN Take 15 mLs by mouth 4 (four) times daily. Swish and spit 480 mL 0  . MAGNESIUM PO Take 500 mg by mouth daily.     . metFORMIN (GLUCOPHAGE) 1000 MG tablet TAKE 1 TABLET BY MOUTH IN  THE MORNING FOR 7 DAYS THEN 2 TABLETS BY MOUTH IN THE  MORNING 180 tablet 0  . metoprolol succinate (TOPROL-XL) 100 MG 24 hr tablet Take 50 mg by mouth daily 45 tablet 3  . nitroGLYCERIN (NITROSTAT)  0.4 MG SL tablet DISSOLVE 1 TABLET UNDER THE TONGUE EVERY 5 MINUTES AS  NEEDED FOR CHEST PAIN. MAX  OF 3 TABLETS IN 15 MINUTES. CALL 911 IF PAIN PERSISTS. 75 tablet 3  . ondansetron (ZOFRAN ODT) 4 MG disintegrating tablet Take 1 tablet (4 mg total) by mouth every 8 (eight) hours as needed for nausea or vomiting. 20 tablet 0  . oxybutynin (DITROPAN) 5 MG tablet TAKE ONE-HALF TABLET (2.74m) BY  MOUTH TWO TIMES DAILY AS  NEEDED FOR BLADDER SPASMS 30 tablet 5  . pantoprazole (PROTONIX) 40 MG tablet TAKE 1 TABLET (425m BY MOUTH  DAILY 90 tablet 2  . potassium chloride SA (K-DUR,KLOR-CON) 20 MEQ tablet TAKE 1 TABLET (2014mBY MOUTH  EVERY DAY 30 tablet 5  . PROCTO-MED HC 2.5 % rectal cream APPLY 3 TIMES DAILY AS  DIRECTED 90 g 0  . tiZANidine (ZANAFLEX) 4 MG capsule Take one three times a day as needed for spasms 30 capsule 0  . XARELTO 20 MG TABS tablet TAKE 1 TABLET BY MOUTH ONCE DAILY WITH SUPPER 30 tablet 0   No current facility-administered medications for this visit.     Allergies:   Patient has no known allergies.   Social History:  The patient  reports that he quit smoking about 8 years ago. His smoking use included cigarettes. He started smoking about 15 years ago. He has a 7.00 pack-year smoking history. He has never used smokeless tobacco. He reports that he does not drink alcohol or use drugs.   Family History:  The patient's  family history includes Anemia in his mother; Aneurysm in his maternal grandmother; CAD (age of onset: 50)2n his father; Diabetes in his brother, father, mother, and paternal grandmother; Gout in his mother; Heart attack in his father; Stroke in his maternal grandfather and paternal grandfather; Transient ischemic attack in his father; Vascular Disease in his father.   ROS:  Please see the history of present illness.   All other systems are personally reviewed and negative.    Exam:    Vital Signs:  BP 129/72   Pulse 79   Ht _0  (1.854 m)   Wt 273 lb (123.8  kg)   BMI 36.02 kg/m   Well sounding and appearing, alert and conversant, regular work of breathing,  good skin color Eyes- anicteric, neuro- grossly intact, skin- no apparent rash or lesions or cyanosis, mouth- oral mucosa  is pink  Labs/Other Tests and Data Reviewed:    Recent Labs: 10/14/2018: Hemoglobin 11.8; Platelets 370 11/25/2018: ALT 18; BUN 19; Creatinine, Ser 1.19; Potassium 4.6; Sodium 140   Wt Readings from Last 3 Encounters:  02/10/19 273 lb (123.8 kg)  02/09/19 273 lb 4.8 oz (124 kg)  01/12/19 278 lb (126.1 kg)     ASSESSMENT & PLAN:    1.  Persistent atrial fibrillation Maintaining SR by symptoms Continue Tikosyn Continue Xarelto for CHADS2VASC of 2  2.  CAD No recent ischemic symptoms  3.  OSA Compliance with CPAP encouraged  4.  Obesity Body mass index is 36.02 kg/m.  5.  HTN Stable No change required today   Follow-up:  With me in the office for EKG and loop explant    Patient Risk:  after full review of this patients clinical status, I feel that they are at moderate risk at this time.  Today, I have spent 15 minutes with the patient with telehealth technology discussing arrhythmia management .    Army Fossa, MD  02/10/2019 9:50 AM     [facility] [address] [place] [address] [phone] (office) [phone] (fax)

## 2019-02-12 ENCOUNTER — Other Ambulatory Visit: Payer: Self-pay | Admitting: Family Medicine

## 2019-02-13 DIAGNOSIS — R1010 Upper abdominal pain, unspecified: Secondary | ICD-10-CM | POA: Diagnosis not present

## 2019-02-13 DIAGNOSIS — D649 Anemia, unspecified: Secondary | ICD-10-CM | POA: Diagnosis not present

## 2019-02-13 DIAGNOSIS — I10 Essential (primary) hypertension: Secondary | ICD-10-CM | POA: Diagnosis not present

## 2019-02-13 DIAGNOSIS — R110 Nausea: Secondary | ICD-10-CM | POA: Diagnosis not present

## 2019-02-14 LAB — CBC WITH DIFFERENTIAL/PLATELET
Absolute Monocytes: 845 {cells}/uL (ref 200–950)
Basophils Absolute: 93 {cells}/uL (ref 0–200)
Basophils Relative: 0.9 %
Eosinophils Absolute: 52 {cells}/uL (ref 15–500)
Eosinophils Relative: 0.5 %
HCT: 36.3 % — ABNORMAL LOW (ref 38.5–50.0)
Hemoglobin: 11.3 g/dL — ABNORMAL LOW (ref 13.2–17.1)
Lymphs Abs: 1936 {cells}/uL (ref 850–3900)
MCH: 25.6 pg — ABNORMAL LOW (ref 27.0–33.0)
MCHC: 31.1 g/dL — ABNORMAL LOW (ref 32.0–36.0)
MCV: 82.1 fL (ref 80.0–100.0)
MPV: 10.4 fL (ref 7.5–12.5)
Monocytes Relative: 8.2 %
Neutro Abs: 7375 {cells}/uL (ref 1500–7800)
Neutrophils Relative %: 71.6 %
Platelets: 463 Thousand/uL — ABNORMAL HIGH (ref 140–400)
RBC: 4.42 Million/uL (ref 4.20–5.80)
RDW: 14.9 % (ref 11.0–15.0)
Total Lymphocyte: 18.8 %
WBC: 10.3 Thousand/uL (ref 3.8–10.8)

## 2019-02-14 LAB — FERRITIN: Ferritin: 4 ng/mL — ABNORMAL LOW (ref 38–380)

## 2019-02-14 LAB — FOLATE: Folate: 9.1 ng/mL

## 2019-02-14 LAB — VITAMIN B12: Vitamin B-12: 265 pg/mL (ref 200–1100)

## 2019-02-14 LAB — IRON, TOTAL/TOTAL IRON BINDING CAP
%SAT: 4 % (calc) — ABNORMAL LOW (ref 20–48)
Iron: 23 ug/dL — ABNORMAL LOW (ref 50–180)
TIBC: 545 mcg/dL (calc) — ABNORMAL HIGH (ref 250–425)

## 2019-02-17 ENCOUNTER — Ambulatory Visit (HOSPITAL_COMMUNITY)
Admission: RE | Admit: 2019-02-17 | Discharge: 2019-02-17 | Disposition: A | Discharge status: Home / Self Care | Payer: BC Managed Care – PPO | Source: Ambulatory Visit | Attending: Nurse Practitioner | Admitting: Nurse Practitioner

## 2019-02-17 ENCOUNTER — Other Ambulatory Visit: Payer: Self-pay

## 2019-02-17 DIAGNOSIS — R110 Nausea: Secondary | ICD-10-CM

## 2019-02-17 DIAGNOSIS — R140 Abdominal distension (gaseous): Secondary | ICD-10-CM | POA: Diagnosis not present

## 2019-02-17 DIAGNOSIS — R1011 Right upper quadrant pain: Secondary | ICD-10-CM | POA: Diagnosis not present

## 2019-02-17 DIAGNOSIS — R1010 Upper abdominal pain, unspecified: Secondary | ICD-10-CM | POA: Insufficient documentation

## 2019-02-21 ENCOUNTER — Encounter (INDEPENDENT_AMBULATORY_CARE_PROVIDER_SITE_OTHER): Payer: Self-pay

## 2019-02-21 ENCOUNTER — Telehealth (INDEPENDENT_AMBULATORY_CARE_PROVIDER_SITE_OTHER): Payer: Self-pay | Admitting: Nurse Practitioner

## 2019-02-21 DIAGNOSIS — E669 Obesity, unspecified: Secondary | ICD-10-CM | POA: Diagnosis not present

## 2019-02-21 DIAGNOSIS — Z7689 Persons encountering health services in other specified circumstances: Secondary | ICD-10-CM | POA: Diagnosis not present

## 2019-02-21 DIAGNOSIS — Z6836 Body mass index (BMI) 36.0-36.9, adult: Secondary | ICD-10-CM | POA: Diagnosis not present

## 2019-02-21 NOTE — Telephone Encounter (Signed)
Patient called would

## 2019-02-21 NOTE — Telephone Encounter (Signed)
Patient called would like an explanation of test results - ph# [phone]

## 2019-02-23 ENCOUNTER — Telehealth (INDEPENDENT_AMBULATORY_CARE_PROVIDER_SITE_OTHER): Payer: Self-pay | Admitting: Nurse Practitioner

## 2019-02-23 ENCOUNTER — Other Ambulatory Visit (INDEPENDENT_AMBULATORY_CARE_PROVIDER_SITE_OTHER): Payer: Self-pay | Admitting: Nurse Practitioner

## 2019-02-23 DIAGNOSIS — D509 Iron deficiency anemia, unspecified: Secondary | ICD-10-CM

## 2019-02-23 DIAGNOSIS — Z86010 Personal history of colonic polyps: Secondary | ICD-10-CM

## 2019-02-23 MED ORDER — FERROUS SULFATE 325 (65 FE) MG PO TABS: 325.0000 mg | ORAL_TABLET | Freq: Every day | ORAL | 1 refills | Status: DC

## 2019-02-23 NOTE — Telephone Encounter (Signed)
Ann, please call pt to schedule an EGD and colonoscopy with [facility]. Patient needs procedure soon as he has iron deficiency anemia, history of heart disease. He is scheduled to see his cardiologist, Dr. Rayann Heman next week. Please call Dr. Wayna Chalet office to request a cardiac clearance to include ASA and Xarelto instructions prior to EGD and colonoscopy. Pt is aware of these instructions as well. I entered orders in Epic. thx

## 2019-02-23 NOTE — Telephone Encounter (Signed)
Tammy please contact patient in 4 weeks to have a repeat CBC, iron, iron saturation, TIBC and ferritin levels done. thx

## 2019-02-24 ENCOUNTER — Other Ambulatory Visit (INDEPENDENT_AMBULATORY_CARE_PROVIDER_SITE_OTHER): Payer: Self-pay | Admitting: *Deleted

## 2019-02-24 ENCOUNTER — Other Ambulatory Visit: Payer: Self-pay | Admitting: Cardiovascular Disease

## 2019-02-24 DIAGNOSIS — D649 Anemia, unspecified: Secondary | ICD-10-CM

## 2019-02-24 DIAGNOSIS — D509 Iron deficiency anemia, unspecified: Secondary | ICD-10-CM

## 2019-02-24 NOTE — Telephone Encounter (Signed)
Labs have been orderd and a letter will be sent to the patient as a reminder.

## 2019-02-24 NOTE — Telephone Encounter (Signed)
I spoke to pt regarding all results 11/19, see lab and sono notes

## 2019-02-27 ENCOUNTER — Other Ambulatory Visit (INDEPENDENT_AMBULATORY_CARE_PROVIDER_SITE_OTHER): Payer: Self-pay | Admitting: *Deleted

## 2019-02-27 DIAGNOSIS — D649 Anemia, unspecified: Secondary | ICD-10-CM

## 2019-02-27 DIAGNOSIS — D509 Iron deficiency anemia, unspecified: Secondary | ICD-10-CM

## 2019-03-01 ENCOUNTER — Ambulatory Visit (INDEPENDENT_AMBULATORY_CARE_PROVIDER_SITE_OTHER): Payer: BC Managed Care – PPO | Admitting: Internal Medicine

## 2019-03-01 ENCOUNTER — Telehealth: Payer: Self-pay | Admitting: Internal Medicine

## 2019-03-01 ENCOUNTER — Encounter: Payer: Self-pay | Admitting: Internal Medicine

## 2019-03-01 ENCOUNTER — Other Ambulatory Visit: Payer: Self-pay

## 2019-03-01 VITALS — BP 146/82 | HR 75 | Ht 73.0 in | Wt 280.0 lb

## 2019-03-01 DIAGNOSIS — G4733 Obstructive sleep apnea (adult) (pediatric): Secondary | ICD-10-CM | POA: Diagnosis not present

## 2019-03-01 DIAGNOSIS — I4819 Other persistent atrial fibrillation: Secondary | ICD-10-CM | POA: Diagnosis not present

## 2019-03-01 DIAGNOSIS — I25118 Atherosclerotic heart disease of native coronary artery with other forms of angina pectoris: Secondary | ICD-10-CM

## 2019-03-01 HISTORY — PX: OTHER SURGICAL HISTORY: SHX169

## 2019-03-01 NOTE — Telephone Encounter (Signed)
Pt says that endoscopy hasn't been scheduled yet and was wanting to know if Dr Rayann Heman would clear him for this and regarding holding Xarelto prior - pt aware that once endoscopy is scheduled that office should fax [place] a clearance form

## 2019-03-01 NOTE — Telephone Encounter (Signed)
Patient called stating that he is being scheduled for an endoscopy in the near future. Was told to contact our office in regards to his blood thinner.

## 2019-03-01 NOTE — Patient Instructions (Addendum)
Medication Instructions:  Continue all current medications.  Labwork: none  Testing/Procedures: none  Follow-Up: 6 months - AFib Clinic  1 year - Dr. Rayann Heman  Any Other Special Instructions Will Be Listed Below (If Applicable).  May take off big Tegaderm in the morning (24 hours).   May take off steri strips in 10 days .  Please do not get wet for a few days.   If you need a refill on your cardiac medications before your next appointment, please call your pharmacy.

## 2019-03-01 NOTE — Progress Notes (Signed)
PCP: [name], MD   Primary EP: Dr Garlan Fillers is a 53 y.o. male who presents today for routine electrophysiology followup.  Since last being seen in our clinic, the patient reports doing reasonably well.  No afib. Today, he denies symptoms of palpitations, chest pain, shortness of breath,  lower extremity edema, dizziness, presyncope, or syncope.  He is having ongoing issues with abdominal fullness and discomfort.  + iron deficiency anemia.  GI workup is ongoing.   The patient is otherwise without complaint today.   Past Medical History:  Diagnosis Date  . A-fib ([place])   . Arthritis   . Asthma   . Coronary artery disease   . Dysrhythmia   . GERD (gastroesophageal reflux disease)   . Headache   . High cholesterol   . History of DVT (deep vein thrombosis)    a. left leg following ablation for SVT  . Hypertension   . Persistent atrial fibrillation ([place])    a. Dx 08/2012, xarelto initiated; b. 09/2012 Tikosyn initiated -> DCCV -> Sinus c. PVI 03/2013 d. PVI 03/2014  . Sleep apnea    USES  CPAP   . Type 2 diabetes mellitus without complication, without long-term current use of insulin ([place]) 02/16/2018  . Wolff-Parkinson-White (WPW) syndrome    a. s/p RFCA 2001 by Dr Caryl Comes, pt reports complicated by post procedure DVT   Past Surgical History:  Procedure Laterality Date  . ATRIAL FIBRILLATION ABLATION  03/23/2013   PVI by DR Rayann Heman for afib  . ATRIAL FIBRILLATION ABLATION N/A 03/23/2013   Procedure: ATRIAL FIBRILLATION ABLATION;  Surgeon: Coralyn Mark, MD;  Location: [place] CATH LAB;  Service: Cardiovascular;  Laterality: N/A;  . ATRIAL FIBRILLATION ABLATION N/A 03/27/2014   PVI Dr Rayann Heman  . BACK SURGERY    . CARDIAC CATHETERIZATION N/A 02/03/2016   Procedure: Left Heart Cath and Coronary Angiography;  Surgeon: Nelva Bush, MD;  Location: [place] CV LAB;  Service: Cardiovascular;  Laterality: N/A;  . CARDIAC CATHETERIZATION N/A 02/03/2016   Procedure:  Intravascular Pressure Wire/FFR Study;  Surgeon: Nelva Bush, MD;  Location: [place] CV LAB;  Service: Cardiovascular;  Laterality: N/A;  . CARDIAC CATHETERIZATION N/A 02/03/2016   Procedure: Coronary Stent Intervention;  Surgeon: Nelva Bush, MD;  Location: [place] CV LAB;  Service: Cardiovascular;  Laterality: N/A;  . CARDIAC SURGERY    . CARDIOVERSION N/A 08/25/2012   Procedure: CARDIOVERSION;  Surgeon: Thayer Headings, MD;  Location: [facility] ENDOSCOPY;  Service: Cardiovascular;  Laterality: N/A;  . CARDIOVERSION N/A 09/06/2012   Procedure: CARDIOVERSION/Bedside;  Surgeon: Carlena Bjornstad, MD;  Location: [place];  Service: Cardiovascular;  Laterality: N/A;  . CARDIOVERSION N/A 01/12/2014   Procedure: CARDIOVERSION;  Surgeon: Pixie Casino, MD;  Location: [facility] ENDOSCOPY;  Service: Cardiovascular;  Laterality: N/A;  . CARDIOVERSION N/A 01/30/2016   Procedure: CARDIOVERSION;  Surgeon: Sueanne Margarita, MD;  Location: [facility] ENDOSCOPY;  Service: Cardiovascular;  Laterality: N/A;  . CARDIOVERSION N/A 04/14/2016   Procedure: CARDIOVERSION;  Surgeon: Pixie Casino, MD;  Location: [facility] ENDOSCOPY;  Service: Cardiovascular;  Laterality: N/A;  . COLONOSCOPY N/A 12/04/2015   Procedure: COLONOSCOPY;  Surgeon: Rogene Houston, MD;  [facility];  Service: Endoscopy;  Laterality: N/A;  730  . CORONARY ANGIOPLASTY    . CORONARY ANGIOPLASTY WITH STENT PLACEMENT    . ELECTROPHYSIOLOGIC STUDY N/A 02/18/2016   Procedure: Atrial Fibrillation Ablation;  Surgeon: Thompson Grayer, MD;  Location: [place] CV  LAB;  Service: Cardiovascular;  Laterality: N/A;  . INGUINAL HERNIA REPAIR Bilateral 06/04/2017   Procedure: OPEN BILATERAL INGUINAL HERNIA REPAIR;  Surgeon: Judeth Horn, MD;  Location: [place];  Service: General;  Laterality: Bilateral;  . LOOP RECORDER IMPLANT N/A 06/29/2014   Procedure: LOOP RECORDER IMPLANT;  Surgeon: Thompson Grayer, MD;  Location: [facility] CATH LAB;  Service: Cardiovascular;  Laterality: N/A;   . NOSE SURGERY     sleep apnea surgery  . PERIPHERAL VASCULAR CATHETERIZATION N/A 03/13/2016   Procedure: Thrombin Injection;  Surgeon: Serafina Mitchell, MD;  Location: [place] CV LAB;  Service: Cardiovascular;  Laterality: N/A;  . STOMACH SURGERY     reflux, fundoplication  . TEE WITHOUT CARDIOVERSION N/A 08/25/2012   Procedure: TRANSESOPHAGEAL ECHOCARDIOGRAM (TEE);  Surgeon: Thayer Headings, MD;  Location: [place];  Service: Cardiovascular;  Laterality: N/A;  . TEE WITHOUT CARDIOVERSION  03/23/2013   DR ROSS  . TEE WITHOUT CARDIOVERSION N/A 03/23/2013   Procedure: TRANSESOPHAGEAL ECHOCARDIOGRAM (TEE);  Surgeon: Fay Records, MD;  Location: [facility] ENDOSCOPY;  Service: Cardiovascular;  Laterality: N/A;  . TEE WITHOUT CARDIOVERSION N/A 03/26/2014   Procedure: TRANSESOPHAGEAL ECHOCARDIOGRAM (TEE);  Surgeon: Josue Hector, MD;  Location: [facility] ENDOSCOPY;  Service: Cardiovascular;  Laterality: N/A;  . TONSILLECTOMY      ROS- all systems are reviewed and negatives except as per HPI above  Current Outpatient Medications  Medication Sig Dispense Refill  . acetaminophen (TYLENOL) 500 MG tablet Take 1,000 mg by mouth every 6 (six) hours as needed for headache.    . albuterol (PROVENTIL HFA) 108 (90 Base) MCG/ACT inhaler Inhale 2 puffs into the lungs every 4 (four) hours as needed for wheezing or shortness of breath. 1 Inhaler 0  . atorvastatin (LIPITOR) 10 MG tablet Take 1 tablet (10 mg total) by mouth daily. 30 tablet 5  . azelastine (ASTELIN) 0.1 % nasal spray Place 2 sprays into both nostrils 2 (two) times daily. 30 mL 12  . blood glucose meter kit and supplies Dispense based on patient and insurance preference. Use to check blood sugar once a day(FOR ICD-10 E10.9, E11.9). 1 each 0  . chlorzoxazone (PARAFON) 500 MG tablet Take 1 tablet (500 mg total) by mouth 3 (three) times daily as needed for muscle spasms. 36 tablet 2  . Cholecalciferol (VITAMIN D) 2000 UNITS CAPS Take 4,000 Units by mouth  daily.     . cyanocobalamin 100 MCG tablet Take 100 mcg by mouth daily.    Marland Kitchen dofetilide (TIKOSYN) 500 MCG capsule TAKE 1 CAPSULE BY MOUTH TWO TIMES DAILY 180 capsule 3  . enalapril (VASOTEC) 10 MG tablet Take 1 tablet (10 mg total) by mouth 2 (two) times daily. 180 tablet 3  . famotidine (PEPCID) 20 MG tablet Take 1 tablet (20 mg total) by mouth at bedtime. 30 tablet 0  . ferrous sulfate 325 (65 FE) MG tablet Take 1 tablet (325 mg total) by mouth daily. 30 tablet 1  . Fluticasone-Salmeterol (ADVAIR DISKUS) 250-50 MCG/DOSE AEPB Inhale 1 puff into the lungs every 12 (twelve) hours. 60 each 0  . furosemide (LASIX) 40 MG tablet TAKE 1 TABLET BY MOUTH  DAILY 90 tablet 1  . hydrOXYzine (VISTARIL) 25 MG capsule Take 2 capsules (50 mg total) by mouth 3 (three) times daily as needed for anxiety. 30 capsule 0  . ketoconazole (NIZORAL) 2 % cream Apply 1 application topically 2 (two) times daily. 30 g 0  . MAGNESIUM PO Take 500 mg by mouth daily.     Marland Kitchen  metFORMIN (GLUCOPHAGE) 1000 MG tablet TAKE 1 TABLET BY MOUTH IN  THE MORNING FOR 7 DAYS THEN 2 TABLETS BY MOUTH IN THE  MORNING (Patient taking differently: 1,000 mg 2 (two) times daily with a meal. TAKE 1 TABLET BY MOUTH IN  THE MORNING FOR 7 DAYS THEN 2 TABLETS BY MOUTH IN THE  MORNING) 180 tablet 3  . metoprolol succinate (TOPROL-XL) 100 MG 24 hr tablet Take 50 mg by mouth daily 45 tablet 3  . nitroGLYCERIN (NITROSTAT) 0.4 MG SL tablet DISSOLVE 1 TABLET UNDER THE TONGUE EVERY 5 MINUTES AS  NEEDED FOR CHEST PAIN. MAX  OF 3 TABLETS IN 15 MINUTES. CALL 911 IF PAIN PERSISTS. 75 tablet 3  . ondansetron (ZOFRAN ODT) 4 MG disintegrating tablet Take 1 tablet (4 mg total) by mouth every 8 (eight) hours as needed for nausea or vomiting. 20 tablet 0  . oxybutynin (DITROPAN) 5 MG tablet TAKE ONE-HALF TABLET (2.5mg) BY  MOUTH TWO TIMES DAILY AS  NEEDED FOR BLADDER SPASMS 30 tablet 5  . pantoprazole (PROTONIX) 40 MG tablet TAKE 1 TABLET (40mg) BY MOUTH  DAILY 90 tablet 2  .  potassium chloride SA (K-DUR,KLOR-CON) 20 MEQ tablet TAKE 1 TABLET (20mg) BY MOUTH  EVERY DAY 30 tablet 5  . PROCTO-MED HC 2.5 % rectal cream APPLY 3 TIMES DAILY AS  DIRECTED 90 g 0  . tiZANidine (ZANAFLEX) 4 MG capsule Take one three times a day as needed for spasms 30 capsule 0  . XARELTO 20 MG TABS tablet TAKE 1 TABLET BY MOUTH ONCE DAILY WITH SUPPER 30 tablet 3  . magic mouthwash SOLN Take 15 mLs by mouth 4 (four) times daily. Swish and spit 480 mL 0   No current facility-administered medications for this visit.     Physical Exam: Vitals:   03/01/19 0846  BP: (!) 146/82  Pulse: 75  SpO2: 97%  Weight: 280 lb (127 kg)  Height: 6' 1" (1.854 m)    GEN- The patient is well appearing, alert and oriented x 3 today.   Head- normocephalic, atraumatic Eyes-  Sclera clear, conjunctiva pink Ears- hearing intact Oropharynx- clear Lungs- Clear to ausculation bilaterally, normal work of breathing Heart- Regular rate and rhythm, no murmurs, rubs or gallops, PMI not laterally displaced GI- soft, NT, ND, + BS Extremities- no clubbing, cyanosis, or edema  Wt Readings from Last 3 Encounters:  03/01/19 280 lb (127 kg)  02/10/19 273 lb (123.8 kg)  02/09/19 273 lb 4.8 oz (124 kg)  68  EKG tracing ordered today is personally reviewed and shows sinus rhythm 68 bpm, PR 148 msec, Qtc 449 msec  Assessment and Plan:  1. Persistent afib Maintaining sinus on tikosyn Qt is stable Labs are uptodate  2. OSA compliance with CPAP encouraged  3. CAD No ischemic symptoms  4. HTN Stable No change required today  5. Obesity Weight loss is advised  ILR is at EOS.  We discussed risks and benefits to ILR removal today.  He wishes to proceed.  Guadalupe Allred MD, FACC 03/01/2019 9:04 AM   PROCEDURES:   1. Implantable loop recorder explantation       DESCRIPTION OF PROCEDURE:  Informed written consent was obtained.  The patient required no sedation for the procedure today.   The patients left  chest was therefore prepped and draped in the usual sterile fashion.  The skin overlying the ILR monitor was infiltrated with lidocaine for local analgesia.  A 0.5-cm incision was made over the site.    The previously implanted ILR was exposed and removed using a combination of sharp and blunt dissection.  Steri- Strips and a sterile dressing were then applied. EBL<34m.  There were no early apparent complications.     CONCLUSIONS:   1. Successful explantation of a [employer]tronic Reveal LINQ implantable loop recorder   2. No early apparent complications.        JThompson GrayerMD, FNea Baptist Memorial Health11/25/2020 9:31 AM

## 2019-03-11 ENCOUNTER — Other Ambulatory Visit (INDEPENDENT_AMBULATORY_CARE_PROVIDER_SITE_OTHER): Payer: Self-pay | Admitting: Nurse Practitioner

## 2019-03-11 DIAGNOSIS — R110 Nausea: Secondary | ICD-10-CM

## 2019-03-11 DIAGNOSIS — K219 Gastro-esophageal reflux disease without esophagitis: Secondary | ICD-10-CM

## 2019-03-13 NOTE — Telephone Encounter (Signed)
Watching sch'd for cancellation

## 2019-03-21 ENCOUNTER — Other Ambulatory Visit (INDEPENDENT_AMBULATORY_CARE_PROVIDER_SITE_OTHER): Payer: Self-pay | Admitting: *Deleted

## 2019-03-21 ENCOUNTER — Telehealth (INDEPENDENT_AMBULATORY_CARE_PROVIDER_SITE_OTHER): Payer: Self-pay | Admitting: *Deleted

## 2019-03-21 ENCOUNTER — Encounter (INDEPENDENT_AMBULATORY_CARE_PROVIDER_SITE_OTHER): Payer: Self-pay | Admitting: *Deleted

## 2019-03-21 DIAGNOSIS — Z6835 Body mass index (BMI) 35.0-35.9, adult: Secondary | ICD-10-CM | POA: Diagnosis not present

## 2019-03-21 DIAGNOSIS — E669 Obesity, unspecified: Secondary | ICD-10-CM | POA: Diagnosis not present

## 2019-03-21 DIAGNOSIS — Z7689 Persons encountering health services in other specified circumstances: Secondary | ICD-10-CM | POA: Diagnosis not present

## 2019-03-21 DIAGNOSIS — I10 Essential (primary) hypertension: Secondary | ICD-10-CM | POA: Diagnosis not present

## 2019-03-21 DIAGNOSIS — D509 Iron deficiency anemia, unspecified: Secondary | ICD-10-CM | POA: Insufficient documentation

## 2019-03-21 DIAGNOSIS — Z86010 Personal history of colonic polyps: Secondary | ICD-10-CM | POA: Insufficient documentation

## 2019-03-21 MED ORDER — SUPREP BOWEL PREP KIT 17.5-3.13-1.6 GM/177ML PO SOLN: 1.0000 | Freq: Once | ORAL | 0 refills | Status: AC

## 2019-03-21 NOTE — Telephone Encounter (Signed)
Patient aware.

## 2019-03-21 NOTE — Telephone Encounter (Signed)
Patient needs suprep TCS/EGD sch'd 12/2 

## 2019-03-21 NOTE — Telephone Encounter (Signed)
That would be fine 

## 2019-03-21 NOTE — Telephone Encounter (Signed)
Patient is scheduled for colonoscopy/endoscopy 04/28/19 - we need him to stop his ASA & Xarelto 2 days prior to procedure -- please advise if ok to stop, thanks

## 2019-03-24 DIAGNOSIS — D509 Iron deficiency anemia, unspecified: Secondary | ICD-10-CM | POA: Diagnosis not present

## 2019-03-24 DIAGNOSIS — D649 Anemia, unspecified: Secondary | ICD-10-CM | POA: Diagnosis not present

## 2019-03-25 LAB — IRON,TIBC AND FERRITIN PANEL
%SAT: 5 % (calc) — ABNORMAL LOW (ref 20–48)
Ferritin: 4 ng/mL — ABNORMAL LOW (ref 38–380)
Iron: 27 ug/dL — ABNORMAL LOW (ref 50–180)
TIBC: 509 mcg/dL (calc) — ABNORMAL HIGH (ref 250–425)

## 2019-03-25 LAB — CBC
HCT: 34.2 % — ABNORMAL LOW (ref 38.5–50.0)
Hemoglobin: 10.8 g/dL — ABNORMAL LOW (ref 13.2–17.1)
MCH: 25.8 pg — ABNORMAL LOW (ref 27.0–33.0)
MCHC: 31.6 g/dL — ABNORMAL LOW (ref 32.0–36.0)
MCV: 81.6 fL (ref 80.0–100.0)
MPV: 10.3 fL (ref 7.5–12.5)
Platelets: 367 10*3/uL (ref 140–400)
RBC: 4.19 10*6/uL — ABNORMAL LOW (ref 4.20–5.80)
RDW: 14.5 % (ref 11.0–15.0)
WBC: 7.6 10*3/uL (ref 3.8–10.8)

## 2019-03-26 ENCOUNTER — Telehealth (INDEPENDENT_AMBULATORY_CARE_PROVIDER_SITE_OTHER): Payer: Self-pay | Admitting: Nurse Practitioner

## 2019-03-26 NOTE — Telephone Encounter (Signed)
Ann, pls contact Dr. Marthann Schiller office for cardiac clearance as previously ordered for EGD and colonoscopy thx

## 2019-03-27 NOTE — Telephone Encounter (Signed)
Please review Colleen's note about cardiac clearance, patient TCS/EGD are already scheduled for 04/28/19, cardiologist did give ok for patient to hold blood thinner prior to procedures

## 2019-03-27 NOTE — Telephone Encounter (Signed)
Ann, Dr. Jackalyn Lombard note does not specifically state cardiac clearance for procedure. Pls review this with Dr. Laural Golden and if Dr. Laural Golden reviews Dr. Jackalyn Lombard note and he is ok with proceeding with EGD and colonoscopy as scheduled then that is ok.

## 2019-03-27 NOTE — Telephone Encounter (Signed)
OV notes for Dr Rayann Heman from 02/10/19 & 03/01/19 are in [facility]

## 2019-03-28 NOTE — Telephone Encounter (Signed)
Saw Dr. Jackalyn Lombard note from 03/01/2019. I believe this is good enough. We can proceed with EGD and colonoscopy as planned.

## 2019-03-29 ENCOUNTER — Other Ambulatory Visit: Payer: Self-pay

## 2019-03-29 ENCOUNTER — Encounter: Payer: Self-pay | Admitting: Nurse Practitioner

## 2019-03-29 ENCOUNTER — Ambulatory Visit (INDEPENDENT_AMBULATORY_CARE_PROVIDER_SITE_OTHER): Payer: BC Managed Care – PPO | Admitting: Nurse Practitioner

## 2019-03-29 DIAGNOSIS — B9689 Other specified bacterial agents as the cause of diseases classified elsewhere: Secondary | ICD-10-CM | POA: Diagnosis not present

## 2019-03-29 DIAGNOSIS — J019 Acute sinusitis, unspecified: Secondary | ICD-10-CM | POA: Diagnosis not present

## 2019-03-29 MED ORDER — AMOXICILLIN-POT CLAVULANATE 875-125 MG PO TABS: 1.0000 | ORAL_TABLET | Freq: Two times a day (BID) | ORAL | 0 refills | Status: DC

## 2019-03-29 NOTE — Progress Notes (Signed)
  VIDEO VISIT Subjective:    Patient ID: Austin Woods, male    DOB: 08-02-1965, 53 y.o.   MRN: [id number]  Sinus Problem This is a new problem. Episode onset: 2 days. Associated symptoms include congestion, headaches, sinus pressure and a sore throat. (Drainage)      Review of Systems  HENT: Positive for congestion, sinus pressure and sore throat.   Neurological: Positive for headaches.   Virtual Visit via Video Note  I connected with Austin Woods on 03/29/19 at  3:20 PM EST by a video enabled telemedicine application and verified that I am speaking with the correct person using two identifiers.  Location: Patient: home Provider: office   I discussed the limitations of evaluation and management by telemedicine and the availability of in person appointments. The patient expressed understanding and agreed to proceed.  History of Present Illness: Presents for c/o sinus congestion and facial pressure for the past 2 days. Runny nose. Fullness in ears. Facial area headache. Occasional productive cough. Yellow to clear mucus. Sore throat. Fatigue. No fever. No change in taste or smell. Has upcoming endoscopy and colonoscopy on 1/22 with COVID testing prior.    Observations/Objective: Format  Patient present at home Provider present at office Consent for interaction obtained Coronavirus outbreak made virtual visit necessary  NAD. Alert, oriented.   Assessment and Plan: Acute bacterial rhinosinusitis  Meds ordered this encounter  Medications  . amoxicillin-clavulanate (AUGMENTIN) 875-125 MG tablet    Sig: Take 1 tablet by mouth 2 (two) times daily.    Dispense:  20 tablet    Refill:  0    Order Specific Question:   Supervising Provider    Answer:   Sallee Lange A [[id number]]     Follow Up Instructions: Start antibiotic as directed. OTC meds as directed. Warning signs reviewed. Call back if worsens or persists. Recommend earlier COVID testing if no improvement.     I  discussed the assessment and treatment plan with the patient. The patient was provided an opportunity to ask questions and all were answered. The patient agreed with the plan and demonstrated an understanding of the instructions.   The patient was advised to call back or seek an in-person evaluation if the symptoms worsen or if the condition fails to improve as anticipated.  I provided 15 minutes of non-face-to-face time during this encounter.        Objective:   Physical Exam        Assessment & Plan:

## 2019-04-04 ENCOUNTER — Ambulatory Visit (INDEPENDENT_AMBULATORY_CARE_PROVIDER_SITE_OTHER): Payer: BC Managed Care – PPO | Admitting: Family Medicine

## 2019-04-04 ENCOUNTER — Ambulatory Visit: Payer: BC Managed Care – PPO | Attending: Internal Medicine

## 2019-04-04 ENCOUNTER — Other Ambulatory Visit: Payer: Self-pay

## 2019-04-04 DIAGNOSIS — Z20822 Contact with and (suspected) exposure to covid-19: Secondary | ICD-10-CM

## 2019-04-04 DIAGNOSIS — Z20828 Contact with and (suspected) exposure to other viral communicable diseases: Secondary | ICD-10-CM | POA: Diagnosis not present

## 2019-04-04 DIAGNOSIS — J019 Acute sinusitis, unspecified: Secondary | ICD-10-CM | POA: Diagnosis not present

## 2019-04-04 DIAGNOSIS — B9689 Other specified bacterial agents as the cause of diseases classified elsewhere: Secondary | ICD-10-CM | POA: Diagnosis not present

## 2019-04-04 MED ORDER — DOXYCYCLINE HYCLATE 100 MG PO TABS: ORAL_TABLET | ORAL | 0 refills | Status: DC

## 2019-04-04 NOTE — Progress Notes (Signed)
   Subjective:  Audio  Patient ID: Austin Woods, male    DOB: [birth year], 53 y.o.   MRN: [id number]  Sinusitis This is a recurrent problem. The current episode started 1 to 4 weeks ago. There has been no fever. Associated symptoms include congestion, coughing and headaches. Past treatments include antibiotics (Mucinex DM). The treatment provided mild relief.   Virtual Visit via Video Note  I connected with [name] on 04/04/19 at 11:00 AM EST by a video enabled telemedicine application and verified that I am speaking with the correct person using two identifiers.  Location: Patient: home Provider: office   I discussed the limitations of evaluation and management by telemedicine and the availability of in person appointments. The patient expressed understanding and agreed to proceed.  History of Present Illness:    Observations/Objective:   Assessment and Plan:   Follow Up Instructions:    I discussed the assessment and treatment plan with the patient. The patient was provided an opportunity to ask questions and all were answered. The patient agreed with the plan and demonstrated an understanding of the instructions.   The patient was advised to call back or seek an in-person evaluation if the symptoms worsen or if the condition fails to improve as anticipated.  I provided 15 minutes of non-face-to-face time during this encounter.   Vicente Males, LPN   Please see prior notes.  Continues have substantial symptoms.  Minimal improvement on current antibiotic.  No one else sick at home Review of Systems  HENT: Positive for congestion.   Respiratory: Positive for cough.   Neurological: Positive for headaches.       Objective:   Physical Exam  Virtual      Assessment & Plan:  Impression persistent respiratory symptoms allergy.  Will change to a different antibiotic.  Symptom care discussed.  Warning signs discussed.  COVID-19 testing recommended patient to  proceed

## 2019-04-05 ENCOUNTER — Encounter: Payer: Self-pay | Admitting: Family Medicine

## 2019-04-05 LAB — NOVEL CORONAVIRUS, NAA: SARS-CoV-2, NAA: NOT DETECTED

## 2019-04-10 ENCOUNTER — Telehealth (INDEPENDENT_AMBULATORY_CARE_PROVIDER_SITE_OTHER): Payer: Self-pay | Admitting: Nurse Practitioner

## 2019-04-10 ENCOUNTER — Telehealth: Payer: Self-pay | Admitting: Family Medicine

## 2019-04-10 DIAGNOSIS — K219 Gastro-esophageal reflux disease without esophagitis: Secondary | ICD-10-CM

## 2019-04-10 DIAGNOSIS — R110 Nausea: Secondary | ICD-10-CM

## 2019-04-10 MED ORDER — CEFDINIR 300 MG PO CAPS: ORAL_CAPSULE | ORAL | 0 refills | Status: DC

## 2019-04-10 MED ORDER — PREDNISONE 20 MG PO TABS: ORAL_TABLET | ORAL | 0 refills | Status: DC

## 2019-04-10 NOTE — Telephone Encounter (Signed)
Addtnl antibiotic omnicef 300 bid ten d  Plus oral steroid taper this will do as good as shot for sinusistis  Pred 20 three qd for three d, two qd for threed, one qd for two d

## 2019-04-10 NOTE — Telephone Encounter (Signed)
Pt had virtual visit 12/29. He is still really congested. He states his head feels like a rock. His cough is not as bad though and no fever. He thinks he needs a steroid shot and would like Dr. Mariane Duval opinion on what he needs to do.   Austin Woods

## 2019-04-10 NOTE — Telephone Encounter (Signed)
Medication sent to pharmacy and pt is aware. Pt states he also needs a work note. Pt unsure how long he should stay out. Please advise. Thank you

## 2019-04-10 NOTE — Telephone Encounter (Signed)
This week

## 2019-04-10 NOTE — Telephone Encounter (Signed)
Pt notified. He said sent note through mychart. He said he has been out since dec23rd. So note should be dec 23rd - jan 9th. Return on the 10th. States he goes back on Sunday.

## 2019-04-10 NOTE — Telephone Encounter (Signed)
Please advise. Thank you

## 2019-04-11 ENCOUNTER — Encounter: Payer: Self-pay | Admitting: Family Medicine

## 2019-04-11 ENCOUNTER — Other Ambulatory Visit: Payer: Self-pay | Admitting: Family Medicine

## 2019-04-11 DIAGNOSIS — B3742 Candidal balanitis: Secondary | ICD-10-CM | POA: Diagnosis not present

## 2019-04-12 ENCOUNTER — Telehealth (INDEPENDENT_AMBULATORY_CARE_PROVIDER_SITE_OTHER): Payer: Self-pay | Admitting: *Deleted

## 2019-04-12 ENCOUNTER — Telehealth: Payer: Self-pay | Admitting: Family Medicine

## 2019-04-12 DIAGNOSIS — Z029 Encounter for administrative examinations, unspecified: Secondary | ICD-10-CM

## 2019-04-12 NOTE — Telephone Encounter (Signed)
Patient had FMLA faxed in today to be completed . In your yellow for review. Please fill in highlighted areas,date and sign.

## 2019-04-12 NOTE — Telephone Encounter (Signed)
Please advise. Thank you

## 2019-04-12 NOTE — Telephone Encounter (Signed)
Refill permitted by Dr.Rehman. Called to [facility] in [place].Patient will be having a EGD on 04/28/2019.

## 2019-04-14 ENCOUNTER — Other Ambulatory Visit (INDEPENDENT_AMBULATORY_CARE_PROVIDER_SITE_OTHER): Payer: Self-pay | Admitting: Gastroenterology

## 2019-04-14 DIAGNOSIS — R110 Nausea: Secondary | ICD-10-CM

## 2019-04-14 DIAGNOSIS — K219 Gastro-esophageal reflux disease without esophagitis: Secondary | ICD-10-CM

## 2019-04-14 MED ORDER — FAMOTIDINE 20 MG PO TABS: 20.0000 mg | ORAL_TABLET | Freq: Every day | ORAL | 6 refills | Status: DC

## 2019-04-14 NOTE — Telephone Encounter (Signed)
Thayer Headings, pls manage rx request thx

## 2019-04-14 NOTE — Progress Notes (Signed)
Electronic Rx refill for pepcid sent to pharmacy

## 2019-04-17 NOTE — Telephone Encounter (Signed)
Pepcid refilled electronically on 04/14/19. Thanks.

## 2019-04-24 NOTE — Patient Instructions (Signed)
RICHTER ROZEK  04/24/2019     @PREFPERIOPPHARMACY @   Your procedure is scheduled on  04/28/2019   [facility]  Call this number if you have problems the morning of surgery:  [phone]   Remember:  Follow the diet and prep instructions given to you by Dr Olevia Perches office.                     Take these medicines the morning of surgery with A SIP OF WATER  zanaflex or chlorzaxazone(if needed), enalapril, hydroxyzine, metoprolol, zofran(if needed), ditropan, protonix.    Do not wear jewelry, make-up or nail polish.  Do not wear lotions, powders, or perfumes. Please wear deodorant and brush our teeth.  Do not shave 48 hours prior to surgery.  Men may shave face and neck.  Do not bring valuables to the hospital.  [facility] is not responsible for any belongings or valuables.  Contacts, dentures or bridgework may not be worn into surgery.  Leave your suitcase in the car.  After surgery it may be brought to your room.  For patients admitted to the hospital, discharge time will be determined by your treatment team.  Patients discharged the day of surgery will not be allowed to drive home.   Name and phone number of your driver:   family Special instructions:  None  Please read over the following fact sheets that you were given. Anesthesia Post-op Instructions and Care and Recovery After Surgery       Upper Endoscopy, Adult, Care After This sheet gives you information about how to care for yourself after your procedure. Your health care provider may also give you more specific instructions. If you have problems or questions, contact your health care provider. What can I expect after the procedure? After the procedure, it is common to have:  A sore throat.  Mild stomach pain or discomfort.  Bloating.  Nausea. Follow these instructions at home:   Follow instructions from your health care provider about what to eat or drink after your  procedure.  Return to your normal activities as told by your health care provider. Ask your health care provider what activities are safe for you.  Take over-the-counter and prescription medicines only as told by your health care provider.  Do not drive for 24 hours if you were given a sedative during your procedure.  Keep all follow-up visits as told by your health care provider. This is important. Contact a health care provider if you have:  A sore throat that lasts longer than one day.  Trouble swallowing. Get help right away if:  You vomit blood or your vomit looks like coffee grounds.  You have: ? A fever. ? Bloody, black, or tarry stools. ? A severe sore throat or you cannot swallow. ? Difficulty breathing. ? Severe pain in your chest or abdomen. Summary  After the procedure, it is common to have a sore throat, mild stomach discomfort, bloating, and nausea.  Do not drive for 24 hours if you were given a sedative during the procedure.  Follow instructions from your health care provider about what to eat or drink after your procedure.  Return to your normal activities as told by your health care provider. This information is not intended to replace advice given to you by your health care provider. Make sure you discuss any questions you have with your health care  provider. Document Revised: 09/14/2017 Document Reviewed: 08/23/2017 Elsevier Patient Education  [place].  Colonoscopy, Adult, Care After This sheet gives you information about how to care for yourself after your procedure. Your health care provider may also give you more specific instructions. If you have problems or questions, contact your health care provider. What can I expect after the procedure? After the procedure, it is common to have:  A small amount of blood in your stool for 24 hours after the procedure.  Some gas.  Mild cramping or bloating of your abdomen. Follow these instructions  at home: Eating and drinking   Drink enough fluid to keep your urine pale yellow.  Follow instructions from your health care provider about eating or drinking restrictions.  Resume your normal diet as instructed by your health care provider. Avoid heavy or fried foods that are hard to digest. Activity  Rest as told by your health care provider.  Avoid sitting for a long time without moving. Get up to take short walks every 1-2 hours. This is important to improve blood flow and breathing. Ask for help if you feel weak or unsteady.  Return to your normal activities as told by your health care provider. Ask your health care provider what activities are safe for you. Managing cramping and bloating   Try walking around when you have cramps or feel bloated.  Apply heat to your abdomen as told by your health care provider. Use the heat source that your health care provider recommends, such as a moist heat pack or a heating pad. ? Place a towel between your skin and the heat source. ? Leave the heat on for 20-30 minutes. ? Remove the heat if your skin turns bright red. This is especially important if you are unable to feel pain, heat, or cold. You may have a greater risk of getting burned. General instructions  For the first 24 hours after the procedure: ? Do not drive or use machinery. ? Do not sign important documents. ? Do not drink alcohol. ? Do your regular daily activities at a slower pace than normal. ? Eat soft foods that are easy to digest.  Take over-the-counter and prescription medicines only as told by your health care provider.  Keep all follow-up visits as told by your health care provider. This is important. Contact a health care provider if:  You have blood in your stool 2-3 days after the procedure. Get help right away if you have:  More than a small spotting of blood in your stool.  Large blood clots in your stool.  Swelling of your abdomen.  Nausea or  vomiting.  A fever.  Increasing pain in your abdomen that is not relieved with medicine. Summary  After the procedure, it is common to have a small amount of blood in your stool. You may also have mild cramping and bloating of your abdomen.  For the first 24 hours after the procedure, do not drive or use machinery, sign important documents, or drink alcohol.  Get help right away if you have a lot of blood in your stool, nausea or vomiting, a fever, or increased pain in your abdomen. This information is not intended to replace advice given to you by your health care provider. Make sure you discuss any questions you have with your health care provider. Document Revised: 10/17/2018 Document Reviewed: 10/17/2018 Elsevier Patient Education  [place] After These instructions provide you with information about caring  for yourself after your procedure. Your health care provider may also give you more specific instructions. Your treatment has been planned according to current medical practices, but problems sometimes occur. Call your health care provider if you have any problems or questions after your procedure. What can I expect after the procedure? After your procedure, you may:  Feel sleepy for several hours.  Feel clumsy and have poor balance for several hours.  Feel forgetful about what happened after the procedure.  Have poor judgment for several hours.  Feel nauseous or vomit.  Have a sore throat if you had a breathing tube during the procedure. Follow these instructions at home: For at least 24 hours after the procedure:      Have a responsible adult stay with you. It is important to have someone help care for you until you are awake and alert.  Rest as needed.  Do not: ? Participate in activities in which you could fall or become injured. ? Drive. ? Use heavy machinery. ? Drink alcohol. ? Take sleeping pills or medicines that  cause drowsiness. ? Make important decisions or sign legal documents. ? Take care of children on your own. Eating and drinking  Follow the diet that is recommended by your health care provider.  If you vomit, drink water, juice, or soup when you can drink without vomiting.  Make sure you have little or no nausea before eating solid foods. General instructions  Take over-the-counter and prescription medicines only as told by your health care provider.  If you have sleep apnea, surgery and certain medicines can increase your risk for breathing problems. Follow instructions from your health care provider about wearing your sleep device: ? Anytime you are sleeping, including during daytime naps. ? While taking prescription pain medicines, sleeping medicines, or medicines that make you drowsy.  If you smoke, do not smoke without supervision.  Keep all follow-up visits as told by your health care provider. This is important. Contact a health care provider if:  You keep feeling nauseous or you keep vomiting.  You feel light-headed.  You develop a rash.  You have a fever. Get help right away if:  You have trouble breathing. Summary  For several hours after your procedure, you may feel sleepy and have poor judgment.  Have a responsible adult stay with you for at least 24 hours or until you are awake and alert. This information is not intended to replace advice given to you by your health care provider. Make sure you discuss any questions you have with your health care provider. Document Revised: 06/21/2017 Document Reviewed: 07/14/2015 Elsevier Patient Education  [place].

## 2019-04-26 ENCOUNTER — Encounter (HOSPITAL_COMMUNITY)
Admission: RE | Admit: 2019-04-26 | Discharge: 2019-04-26 | Disposition: A | Discharge status: Home / Self Care | Payer: BC Managed Care – PPO | Source: Ambulatory Visit | Attending: Internal Medicine | Admitting: Internal Medicine

## 2019-04-26 ENCOUNTER — Other Ambulatory Visit (HOSPITAL_COMMUNITY)
Admission: RE | Admit: 2019-04-26 | Discharge: 2019-04-26 | Disposition: A | Discharge status: Home / Self Care | Payer: BC Managed Care – PPO | Source: Ambulatory Visit | Attending: Internal Medicine | Admitting: Internal Medicine

## 2019-04-26 ENCOUNTER — Other Ambulatory Visit: Payer: Self-pay

## 2019-04-26 ENCOUNTER — Encounter (HOSPITAL_COMMUNITY): Payer: Self-pay

## 2019-04-26 DIAGNOSIS — D509 Iron deficiency anemia, unspecified: Secondary | ICD-10-CM

## 2019-04-26 DIAGNOSIS — Z860100 Personal history of colon polyps, unspecified: Secondary | ICD-10-CM

## 2019-04-26 DIAGNOSIS — Z01812 Encounter for preprocedural laboratory examination: Secondary | ICD-10-CM | POA: Diagnosis not present

## 2019-04-26 DIAGNOSIS — K649 Unspecified hemorrhoids: Secondary | ICD-10-CM | POA: Insufficient documentation

## 2019-04-26 HISTORY — DX: Anemia, unspecified: D64.9

## 2019-04-26 LAB — CBC WITH DIFFERENTIAL/PLATELET
Abs Immature Granulocytes: 0.02 10*3/uL (ref 0.00–0.07)
Basophils Absolute: 0.1 10*3/uL (ref 0.0–0.1)
Basophils Relative: 1 %
Eosinophils Absolute: 0.1 10*3/uL (ref 0.0–0.5)
Eosinophils Relative: 1 %
HCT: 36.8 % — ABNORMAL LOW (ref 39.0–52.0)
Hemoglobin: 11.3 g/dL — ABNORMAL LOW (ref 13.0–17.0)
Immature Granulocytes: 0 %
Lymphocytes Relative: 18 %
Lymphs Abs: 1.5 10*3/uL (ref 0.7–4.0)
MCH: 26.6 pg (ref 26.0–34.0)
MCHC: 30.7 g/dL (ref 30.0–36.0)
MCV: 86.6 fL (ref 80.0–100.0)
Monocytes Absolute: 0.7 10*3/uL (ref 0.1–1.0)
Monocytes Relative: 8 %
Neutro Abs: 6.4 10*3/uL (ref 1.7–7.7)
Neutrophils Relative %: 72 %
Platelets: 275 10*3/uL (ref 150–400)
RBC: 4.25 MIL/uL (ref 4.22–5.81)
RDW: 17.6 % — ABNORMAL HIGH (ref 11.5–15.5)
WBC: 8.7 10*3/uL (ref 4.0–10.5)
nRBC: 0 % (ref 0.0–0.2)

## 2019-04-26 LAB — BASIC METABOLIC PANEL
Anion gap: 12 (ref 5–15)
BUN: 16 mg/dL (ref 6–20)
CO2: 26 mmol/L (ref 22–32)
Calcium: 9 mg/dL (ref 8.9–10.3)
Chloride: 102 mmol/L (ref 98–111)
Creatinine, Ser: 1.2 mg/dL (ref 0.61–1.24)
GFR calc Af Amer: >60 mL/min (ref >60)
GFR calc non Af Amer: >60 mL/min (ref >60)
Glucose, Bld: 102 mg/dL — ABNORMAL HIGH (ref 70–99)
Potassium: 4.1 mmol/L (ref 3.5–5.1)
Sodium: 140 mmol/L (ref 135–145)

## 2019-04-26 LAB — SARS CORONAVIRUS 2 (TAT 6-24 HRS): SARS Coronavirus 2: NEGATIVE

## 2019-04-28 ENCOUNTER — Ambulatory Visit (HOSPITAL_COMMUNITY): Payer: BC Managed Care – PPO | Admitting: Anesthesiology

## 2019-04-28 ENCOUNTER — Encounter (HOSPITAL_COMMUNITY): Payer: Self-pay | Admitting: Internal Medicine

## 2019-04-28 ENCOUNTER — Ambulatory Visit (HOSPITAL_COMMUNITY)
Admission: RE | Admit: 2019-04-28 | Discharge: 2019-04-28 | Disposition: A | Discharge status: Home / Self Care | Payer: BC Managed Care – PPO | Attending: Internal Medicine | Admitting: Internal Medicine

## 2019-04-28 ENCOUNTER — Encounter (HOSPITAL_COMMUNITY)
Admission: RE | Disposition: A | Discharge status: Home / Self Care | Payer: Self-pay | Source: Home / Self Care | Attending: Internal Medicine

## 2019-04-28 DIAGNOSIS — K219 Gastro-esophageal reflux disease without esophagitis: Secondary | ICD-10-CM | POA: Diagnosis not present

## 2019-04-28 DIAGNOSIS — I2510 Atherosclerotic heart disease of native coronary artery without angina pectoris: Secondary | ICD-10-CM | POA: Diagnosis not present

## 2019-04-28 DIAGNOSIS — J45909 Unspecified asthma, uncomplicated: Secondary | ICD-10-CM | POA: Insufficient documentation

## 2019-04-28 DIAGNOSIS — Z8349 Family history of other endocrine, nutritional and metabolic diseases: Secondary | ICD-10-CM | POA: Insufficient documentation

## 2019-04-28 DIAGNOSIS — Z7951 Long term (current) use of inhaled steroids: Secondary | ICD-10-CM | POA: Diagnosis not present

## 2019-04-28 DIAGNOSIS — Z7984 Long term (current) use of oral hypoglycemic drugs: Secondary | ICD-10-CM | POA: Diagnosis not present

## 2019-04-28 DIAGNOSIS — K317 Polyp of stomach and duodenum: Secondary | ICD-10-CM | POA: Insufficient documentation

## 2019-04-28 DIAGNOSIS — Z7901 Long term (current) use of anticoagulants: Secondary | ICD-10-CM | POA: Insufficient documentation

## 2019-04-28 DIAGNOSIS — K5730 Diverticulosis of large intestine without perforation or abscess without bleeding: Secondary | ICD-10-CM | POA: Insufficient documentation

## 2019-04-28 DIAGNOSIS — Z955 Presence of coronary angioplasty implant and graft: Secondary | ICD-10-CM | POA: Diagnosis not present

## 2019-04-28 DIAGNOSIS — Z87891 Personal history of nicotine dependence: Secondary | ICD-10-CM | POA: Insufficient documentation

## 2019-04-28 DIAGNOSIS — Z86718 Personal history of other venous thrombosis and embolism: Secondary | ICD-10-CM | POA: Insufficient documentation

## 2019-04-28 DIAGNOSIS — D509 Iron deficiency anemia, unspecified: Secondary | ICD-10-CM | POA: Insufficient documentation

## 2019-04-28 DIAGNOSIS — D123 Benign neoplasm of transverse colon: Secondary | ICD-10-CM | POA: Insufficient documentation

## 2019-04-28 DIAGNOSIS — K644 Residual hemorrhoidal skin tags: Secondary | ICD-10-CM | POA: Insufficient documentation

## 2019-04-28 DIAGNOSIS — I456 Pre-excitation syndrome: Secondary | ICD-10-CM | POA: Insufficient documentation

## 2019-04-28 DIAGNOSIS — Z860100 Personal history of colon polyps, unspecified: Secondary | ICD-10-CM | POA: Insufficient documentation

## 2019-04-28 DIAGNOSIS — D122 Benign neoplasm of ascending colon: Secondary | ICD-10-CM | POA: Insufficient documentation

## 2019-04-28 DIAGNOSIS — Z79899 Other long term (current) drug therapy: Secondary | ICD-10-CM | POA: Insufficient documentation

## 2019-04-28 DIAGNOSIS — E7800 Pure hypercholesterolemia, unspecified: Secondary | ICD-10-CM | POA: Insufficient documentation

## 2019-04-28 DIAGNOSIS — D132 Benign neoplasm of duodenum: Secondary | ICD-10-CM | POA: Diagnosis not present

## 2019-04-28 DIAGNOSIS — M1990 Unspecified osteoarthritis, unspecified site: Secondary | ICD-10-CM | POA: Diagnosis not present

## 2019-04-28 DIAGNOSIS — K449 Diaphragmatic hernia without obstruction or gangrene: Secondary | ICD-10-CM

## 2019-04-28 DIAGNOSIS — Z8249 Family history of ischemic heart disease and other diseases of the circulatory system: Secondary | ICD-10-CM | POA: Insufficient documentation

## 2019-04-28 DIAGNOSIS — K635 Polyp of colon: Secondary | ICD-10-CM | POA: Diagnosis not present

## 2019-04-28 DIAGNOSIS — Z9889 Other specified postprocedural states: Secondary | ICD-10-CM | POA: Insufficient documentation

## 2019-04-28 DIAGNOSIS — Z833 Family history of diabetes mellitus: Secondary | ICD-10-CM | POA: Insufficient documentation

## 2019-04-28 DIAGNOSIS — I4891 Unspecified atrial fibrillation: Secondary | ICD-10-CM | POA: Diagnosis not present

## 2019-04-28 DIAGNOSIS — E119 Type 2 diabetes mellitus without complications: Secondary | ICD-10-CM | POA: Diagnosis not present

## 2019-04-28 DIAGNOSIS — G4730 Sleep apnea, unspecified: Secondary | ICD-10-CM | POA: Insufficient documentation

## 2019-04-28 DIAGNOSIS — Z7952 Long term (current) use of systemic steroids: Secondary | ICD-10-CM | POA: Diagnosis not present

## 2019-04-28 DIAGNOSIS — Z823 Family history of stroke: Secondary | ICD-10-CM | POA: Insufficient documentation

## 2019-04-28 DIAGNOSIS — I10 Essential (primary) hypertension: Secondary | ICD-10-CM | POA: Insufficient documentation

## 2019-04-28 DIAGNOSIS — R12 Heartburn: Secondary | ICD-10-CM

## 2019-04-28 HISTORY — PX: POLYPECTOMY: SHX5525

## 2019-04-28 HISTORY — PX: COLONOSCOPY WITH PROPOFOL: SHX5780

## 2019-04-28 HISTORY — PX: ESOPHAGOGASTRODUODENOSCOPY (EGD) WITH PROPOFOL: SHX5813

## 2019-04-28 LAB — GLUCOSE, CAPILLARY
Glucose-Capillary: 120 mg/dL — ABNORMAL HIGH (ref 70–99)
Glucose-Capillary: 169 mg/dL — ABNORMAL HIGH (ref 70–99)

## 2019-04-28 SURGERY — ESOPHAGOGASTRODUODENOSCOPY (EGD) WITH PROPOFOL
Anesthesia: General

## 2019-04-28 MED ORDER — PROPOFOL 10 MG/ML IV BOLUS
INTRAVENOUS | Status: AC
  Filled 2019-04-28: qty 20

## 2019-04-28 MED ORDER — GLUCAGON HCL RDNA (DIAGNOSTIC) 1 MG IJ SOLR
INTRAMUSCULAR | Status: DC | PRN
  Administered 2019-04-28: .2 mg via INTRAVENOUS

## 2019-04-28 MED ORDER — PROPOFOL 10 MG/ML IV BOLUS
INTRAVENOUS | Status: AC
  Filled 2019-04-28: qty 40

## 2019-04-28 MED ORDER — MIDAZOLAM HCL 2 MG/2ML IJ SOLN
INTRAMUSCULAR | Status: AC
  Filled 2019-04-28: qty 2

## 2019-04-28 MED ORDER — CHLORHEXIDINE GLUCONATE CLOTH 2 % EX PADS: 6.0000 | MEDICATED_PAD | Freq: Once | CUTANEOUS | Status: DC

## 2019-04-28 MED ORDER — GLUCAGON HCL RDNA (DIAGNOSTIC) 1 MG IJ SOLR
INTRAMUSCULAR | Status: AC
  Filled 2019-04-28: qty 1

## 2019-04-28 MED ORDER — STERILE WATER FOR IRRIGATION IR SOLN
Status: DC | PRN
  Administered 2019-04-28: 2.5 mL

## 2019-04-28 MED ORDER — EPHEDRINE 5 MG/ML INJ
INTRAVENOUS | Status: AC
  Filled 2019-04-28: qty 10

## 2019-04-28 MED ORDER — KETAMINE HCL 50 MG/5ML IJ SOSY
PREFILLED_SYRINGE | INTRAMUSCULAR | Status: AC
  Filled 2019-04-28: qty 5

## 2019-04-28 MED ORDER — EPHEDRINE SULFATE 50 MG/ML IJ SOLN
INTRAMUSCULAR | Status: DC | PRN
  Administered 2019-04-28 (×2): 10 mg via INTRAVENOUS
  Administered 2019-04-28 (×2): 15 mg via INTRAVENOUS

## 2019-04-28 MED ORDER — LACTATED RINGERS IV SOLN
Freq: Once | INTRAVENOUS | Status: AC
  Administered 2019-04-28: 07:00:00 1000 mL via INTRAVENOUS

## 2019-04-28 MED ORDER — LACTATED RINGERS IV SOLN: INTRAVENOUS | Status: DC | PRN

## 2019-04-28 MED ORDER — MIDAZOLAM HCL 5 MG/5ML IJ SOLN
INTRAMUSCULAR | Status: DC | PRN
  Administered 2019-04-28: 2 mg via INTRAVENOUS

## 2019-04-28 MED ORDER — KETAMINE HCL 10 MG/ML IJ SOLN
INTRAMUSCULAR | Status: DC | PRN
  Administered 2019-04-28: 20 mg via INTRAVENOUS
  Administered 2019-04-28: 30 mg via INTRAVENOUS

## 2019-04-28 MED ORDER — PROPOFOL 500 MG/50ML IV EMUL
INTRAVENOUS | Status: DC | PRN
  Administered 2019-04-28: 150 ug/kg/min via INTRAVENOUS

## 2019-04-28 MED ORDER — LIDOCAINE 2% (20 MG/ML) 5 ML SYRINGE
INTRAMUSCULAR | Status: AC
  Filled 2019-04-28: qty 5

## 2019-04-28 MED ORDER — LIDOCAINE HCL (CARDIAC) PF 100 MG/5ML IV SOSY
PREFILLED_SYRINGE | INTRAVENOUS | Status: DC | PRN
  Administered 2019-04-28: 100 mg via INTRAVENOUS

## 2019-04-28 NOTE — Anesthesia Preprocedure Evaluation (Addendum)
Anesthesia Evaluation  Patient identified by MRN, date of birth, ID band Patient awake    Reviewed: Allergy & Precautions, NPO status , Patient's Chart, lab work & pertinent test results, reviewed documented beta blocker date and time   History of Anesthesia Complications Negative for: history of anesthetic complications  Airway Mallampati: II  TM Distance: >3 FB Neck ROM: Full    Dental no notable dental hx.    Pulmonary asthma , sleep apnea and Continuous Positive Airway Pressure Ventilation , former smoker,    Pulmonary exam normal breath sounds clear to auscultation       Cardiovascular METS: 3 - Mets hypertension, Pt. on medications and Pt. on home beta blockers + angina + CAD, + Cardiac Stents and +CHF  Normal cardiovascular exam+ dysrhythmias  Rhythm:Regular Rate:Normal   1. The left ventricle has normal systolic function with an ejection fraction of 60-65%. The cavity size was normal. Left ventricular diastolic parameters were normal.  2. The right ventricle has normal systolic function. The cavity was normal. There is no increase in right ventricular wall thickness.  3. No evidence of mitral valve stenosis.  4. The aortic valve is tricuspid. No stenosis of the aortic valve.  5. The aortic root is normal in size and structure.  6. Pulmonary hypertension is indeterminate, inadequate TR jet. cath- Conclusions: 1. Mild to moderate LMCA and moderate to severe proximal/mid LAD and D1 disease. FFR of proximal/mid LAD stenosis was significant at 0.72 with greatest pressure drop occurring across this lesion. 2. Normal left ventricular filling pressure. 3. Successful FFR guided PCI to proximal/mid LAD with 0% residual stenosis and TIMI-3 flow. Prevention to mid LAD resulted in total occlusion of the ostium of D1. TIMI-3 flow was successfully restored with 30% residual stenosis using balloon angioplasty with kissing balloon  inflation.  Recommendations: 1. Dual antiplatelet therapy with aspirin and clopidogrel for at least a month, in addition to rivaroxaban or other anticoagulation. At that point, could consider discontinuation of aspirin if no ischemic symptoms or present. 2. Aggressive secondary prevention. 3. Restart heparin infusion for atrial fibrillation 2 hours after TR band has been deflated.  Nelva Bush, MD [facility]    [phone] Sinus  Rhythm  Low voltage in precordial leads.   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   -  Nonspecific T-abnormality.     Neuro/Psych  Headaches, negative psych ROS   GI/Hepatic Neg liver ROS, GERD  Medicated and Poorly Controlled,  Endo/Other  diabetes, Well Controlled, Type 2, Oral Hypoglycemic Agents  Renal/GU negative Renal ROS     Musculoskeletal  (+) Arthritis ,   Abdominal   Peds negative pediatric ROS (+)  Hematology  (+) anemia ,   Anesthesia Other Findings   Reproductive/Obstetrics                           Anesthesia Physical Anesthesia Plan  ASA: III  Anesthesia Plan: General   Post-op Pain Management:    Induction: Intravenous  PONV Risk Score and Plan: TIVA  Airway Management Planned: Nasal Cannula, Natural Airway and Simple Face Mask  Additional Equipment:   Intra-op Plan:   Post-operative Plan:   Informed Consent: I have reviewed the patients History and Physical, chart, labs and discussed the procedure including the risks, benefits and alternatives for the proposed anesthesia with the patient or authorized representative who has indicated his/her understanding and acceptance.     Dental advisory given  Plan Discussed with:  CRNA and Surgeon  Anesthesia Plan Comments:         Anesthesia Quick Evaluation

## 2019-04-28 NOTE — Discharge Instructions (Signed)
No aspirin or NSAIDs for 1 week. Resume Xarelto on the evening of 05/01/2019. Resume other medications as before. Resume usual diet. No driving for 24 hours. Physician will call with biopsy results. You have 3 clips placed.    Colon Polyps  Polyps are tissue growths inside the body. Polyps can grow in many places, including the large intestine (colon). A polyp may be a round bump or a mushroom-shaped growth. You could have one polyp or several. Most colon polyps are noncancerous (benign). However, some colon polyps can become cancerous over time. Finding and removing the polyps early can help prevent this. What are the causes? The exact cause of colon polyps is not known. What increases the risk? You are more likely to develop this condition if you:  Have a family history of colon cancer or colon polyps.  Are older than 63 or older than 45 if you are African American.  Have inflammatory bowel disease, such as ulcerative colitis or Crohn's disease.  Have certain hereditary conditions, such as: ? Familial adenomatous polyposis. ? Lynch syndrome. ? Turcot syndrome. ? Peutz-Jeghers syndrome.  Are overweight.  Smoke cigarettes.  Do not get enough exercise.  Drink too much alcohol.  Eat a diet that is high in fat and red meat and low in fiber.  Had childhood cancer that was treated with abdominal radiation. What are the signs or symptoms? Most polyps do not cause symptoms. If you have symptoms, they may include:  Blood coming from your rectum when having a bowel movement.  Blood in your stool. The stool may look dark red or black.  Abdominal pain.  A change in bowel habits, such as constipation or diarrhea. How is this diagnosed? This condition is diagnosed with a colonoscopy. This is a procedure in which a lighted, flexible scope is inserted into the anus and then passed into the colon to examine the area. Polyps are sometimes found when a colonoscopy is done as part of  routine cancer screening tests. How is this treated? Treatment for this condition involves removing any polyps that are found. Most polyps can be removed during a colonoscopy. Those polyps will then be tested for cancer. Additional treatment may be needed depending on the results of testing. Follow these instructions at home: Lifestyle  Maintain a healthy weight, or lose weight if recommended by your health care provider.  Exercise every day or as told by your health care provider.  Do not use any products that contain nicotine or tobacco, such as cigarettes and e-cigarettes. If you need help quitting, ask your health care provider.  If you drink alcohol, limit how much you have: ? 0-1 drink a day for women. ? 0-2 drinks a day for men.  Be aware of how much alcohol is in your drink. In the [place] one drink equals one 12 oz bottle of beer (355 mL), one 5 oz glass of wine (148 mL), or one 1 oz shot of hard liquor (44 mL). Eating and drinking   Eat foods that are high in fiber, such as fruits, vegetables, and whole grains.  Eat foods that are high in calcium and vitamin D, such as milk, cheese, yogurt, eggs, liver, fish, and broccoli.  Limit foods that are high in fat, such as fried foods and desserts.  Limit the amount of red meat and processed meat you eat, such as hot dogs, sausage, bacon, and lunch meats. General instructions  Keep all follow-up visits as told by your health care provider. This  is important. ? This includes having regularly scheduled colonoscopies. ? Talk to your health care provider about when you need a colonoscopy. Contact a health care provider if:  You have new or worsening bleeding during a bowel movement.  You have new or increased blood in your stool.  You have a change in bowel habits.  You lose weight for no known reason. Summary  Polyps are tissue growths inside the body. Polyps can grow in many places, including the colon.  Most colon polyps  are noncancerous (benign), but some can become cancerous over time.  This condition is diagnosed with a colonoscopy.  Treatment for this condition involves removing any polyps that are found. Most polyps can be removed during a colonoscopy. This information is not intended to replace advice given to you by your health care provider. Make sure you discuss any questions you have with your health care provider. Document Revised: 07/08/2017 Document Reviewed: 07/08/2017 Elsevier Patient Education  [place].

## 2019-04-28 NOTE — H&P (Signed)
Austin Woods is an 54 y.o. male.   Chief Complaint: Patient is here for esophagogastroduodenoscopy and colonoscopy. HPI: Patient is 54 year old Caucasian male with multiple medical problems including history of A. fib coronary artery disease diabetes mellitus who has chronic GERD with remote fundoplication as well as history of colonic adenomas who was found to have iron deficiency anemia recently.  He complains of intractable bloating.  He also complains of frequent heartburn and regurgitation all the way to his throat.  He denies dysphagia.  He denies abdominal pain.  He states his stools are soft but no melena or rectal bleeding reported. He has been off Xarelto for 2 days. Family history is negative for celiac disease and CRC.  States his brother has cirrhosis but he does not know as to the etiology.  His brother is diabetic.  Past Medical History:  Diagnosis Date  . A-fib ([place])   . Anemia    iron def anemia  . Arthritis   . Asthma   . Coronary artery disease   . Dysrhythmia    in and out Afib  . GERD (gastroesophageal reflux disease)   . Headache   . High cholesterol   . History of DVT (deep vein thrombosis)    a. left leg following ablation for SVT  . Hypertension   . Persistent atrial fibrillation ([place])    a. Dx 08/2012, xarelto initiated; b. 09/2012 Tikosyn initiated -> DCCV -> Sinus c. PVI 03/2013 d. PVI 03/2014  . Sleep apnea    USES  CPAP   . Type 2 diabetes mellitus without complication, without long-term current use of insulin ([place]) 02/16/2018  . Wolff-Parkinson-White (WPW) syndrome    a. s/p RFCA 2001 by Dr Caryl Comes, pt reports complicated by post procedure DVT    Past Surgical History:  Procedure Laterality Date  . ATRIAL FIBRILLATION ABLATION  03/23/2013   PVI by DR Rayann Heman for afib  . ATRIAL FIBRILLATION ABLATION N/A 03/23/2013   Procedure: ATRIAL FIBRILLATION ABLATION;  Surgeon: Coralyn Mark, MD;  Location: [facility] CATH LAB;  Service: Cardiovascular;  Laterality: N/A;   . ATRIAL FIBRILLATION ABLATION N/A 03/27/2014   PVI Dr Rayann Heman  . BACK SURGERY    . CARDIAC CATHETERIZATION N/A 02/03/2016   Procedure: Left Heart Cath and Coronary Angiography;  Surgeon: Nelva Bush, MD;  Location: [facility] CV LAB;  Service: Cardiovascular;  Laterality: N/A;  . CARDIAC CATHETERIZATION N/A 02/03/2016   Procedure: Intravascular Pressure Wire/FFR Study;  Surgeon: Nelva Bush, MD;  Location: [place] CV LAB;  Service: Cardiovascular;  Laterality: N/A;  . CARDIAC CATHETERIZATION N/A 02/03/2016   Procedure: Coronary Stent Intervention;  Surgeon: Nelva Bush, MD;  Location: [facility] CV LAB;  Service: Cardiovascular;  Laterality: N/A;  . CARDIAC SURGERY    . CARDIOVERSION N/A 08/25/2012   Procedure: CARDIOVERSION;  Surgeon: Thayer Headings, MD;  Location: [facility] ENDOSCOPY;  Service: Cardiovascular;  Laterality: N/A;  . CARDIOVERSION N/A 09/06/2012   Procedure: CARDIOVERSION/Bedside;  Surgeon: Carlena Bjornstad, MD;  Location: [place];  Service: Cardiovascular;  Laterality: N/A;  . CARDIOVERSION N/A 01/12/2014   Procedure: CARDIOVERSION;  Surgeon: Pixie Casino, MD;  Location: [facility]COPY;  Service: Cardiovascular;  Laterality: N/A;  . CARDIOVERSION N/A 01/30/2016   Procedure: CARDIOVERSION;  Surgeon: Sueanne Margarita, MD;  Location: [facility] ENDOSCOPY;  Service: Cardiovascular;  Laterality: N/A;  . CARDIOVERSION N/A 04/14/2016   Procedure: CARDIOVERSION;  Surgeon: Pixie Casino, MD;  Location: [place];  Service: Cardiovascular;  Laterality: N/A;  .  COLONOSCOPY N/A 12/04/2015   Procedure: COLONOSCOPY;  Surgeon: Rogene Houston, MD;  [facility];  Service: Endoscopy;  Laterality: N/A;  730  . CORONARY ANGIOPLASTY    . CORONARY ANGIOPLASTY WITH STENT PLACEMENT    . ELECTROPHYSIOLOGIC STUDY N/A 02/18/2016   Procedure: Atrial Fibrillation Ablation;  Surgeon: Thompson Grayer, MD;  Location: [place] CV LAB;  Service: Cardiovascular;  Laterality: N/A;  . implantable  loop recorder removal  03/01/2019   MDT Reveal LINQ removed in office  . INGUINAL HERNIA REPAIR Bilateral 06/04/2017   Procedure: OPEN BILATERAL INGUINAL HERNIA REPAIR;  Surgeon: Judeth Horn, MD;  Location: [place];  Service: General;  Laterality: Bilateral;  . LOOP RECORDER IMPLANT N/A 06/29/2014   Procedure: LOOP RECORDER IMPLANT;  Surgeon: Thompson Grayer, MD;  Location: [facility] CATH LAB;  Service: Cardiovascular;  Laterality: N/A;  . NOSE SURGERY     sleep apnea surgery  . PERIPHERAL VASCULAR CATHETERIZATION N/A 03/13/2016   Procedure: Thrombin Injection;  Surgeon: Serafina Mitchell, MD;  Location: [place] CV LAB;  Service: Cardiovascular;  Laterality: N/A;  . STOMACH SURGERY     reflux, fundoplication  . TEE WITHOUT CARDIOVERSION N/A 08/25/2012   Procedure: TRANSESOPHAGEAL ECHOCARDIOGRAM (TEE);  Surgeon: Thayer Headings, MD;  Location: [place];  Service: Cardiovascular;  Laterality: N/A;  . TEE WITHOUT CARDIOVERSION  03/23/2013   DR ROSS  . TEE WITHOUT CARDIOVERSION N/A 03/23/2013   Procedure: TRANSESOPHAGEAL ECHOCARDIOGRAM (TEE);  Surgeon: Fay Records, MD;  Location: [facility] ENDOSCOPY;  Service: Cardiovascular;  Laterality: N/A;  . TEE WITHOUT CARDIOVERSION N/A 03/26/2014   Procedure: TRANSESOPHAGEAL ECHOCARDIOGRAM (TEE);  Surgeon: Josue Hector, MD;  Location: [facility] ENDOSCOPY;  Service: Cardiovascular;  Laterality: N/A;  . TONSILLECTOMY      Family History  Problem Relation Age of Onset  . CAD Father 58  . Diabetes Father   . Heart attack Father   . Transient ischemic attack Father   . Vascular Disease Father   . Diabetes Mother   . Anemia Mother   . Gout Mother   . Aneurysm Maternal Grandmother   . Stroke Maternal Grandfather   . Diabetes Paternal Grandmother   . Stroke Paternal Grandfather   . Diabetes Brother    Social History:  reports that he quit smoking about 8 years ago. His smoking use included cigarettes. He started smoking about 15 years ago. He has a 7.00 pack-year  smoking history. He has never used smokeless tobacco. He reports that he does not drink alcohol or use drugs.  Allergies: No Known Allergies  Medications Prior to Admission  Medication Sig Dispense Refill  . acetaminophen (TYLENOL) 500 MG tablet Take 1,000 mg by mouth every 6 (six) hours as needed for moderate pain or headache.     . albuterol (PROVENTIL HFA) 108 (90 Base) MCG/ACT inhaler Inhale 2 puffs into the lungs every 4 (four) hours as needed for wheezing or shortness of breath. 1 Inhaler 0  . atorvastatin (LIPITOR) 10 MG tablet Take 1 tablet (10 mg total) by mouth daily. 30 tablet 5  . azelastine (ASTELIN) 0.1 % nasal spray Place 2 sprays into both nostrils 2 (two) times daily. 30 mL 12  . blood glucose meter kit and supplies Dispense based on patient and insurance preference. Use to check blood sugar once a day(FOR ICD-10 E10.9, E11.9). 1 each 0  . chlorzoxazone (PARAFON) 500 MG tablet Take 1 tablet (500 mg total) by mouth 3 (three) times daily as needed for muscle spasms.  36 tablet 2  . Cholecalciferol (VITAMIN D) 2000 UNITS CAPS Take 4,000 Units by mouth daily.     . cyanocobalamin 100 MCG tablet Take 100 mcg by mouth daily.    Marland Kitchen dofetilide (TIKOSYN) 500 MCG capsule TAKE 1 CAPSULE BY MOUTH TWO TIMES DAILY (Patient taking differently: Take 500 mcg by mouth 2 (two) times daily. ) 180 capsule 3  . enalapril (VASOTEC) 10 MG tablet Take 1 tablet (10 mg total) by mouth 2 (two) times daily. 180 tablet 3  . famotidine (PEPCID) 20 MG tablet Take 1 tablet (20 mg total) by mouth at bedtime. 30 tablet 6  . ferrous sulfate 325 (65 FE) MG tablet Take 1 tablet (325 mg total) by mouth daily. 30 tablet 1  . Fluticasone-Salmeterol (ADVAIR DISKUS) 250-50 MCG/DOSE AEPB Inhale 1 puff into the lungs every 12 (twelve) hours. 60 each 0  . furosemide (LASIX) 40 MG tablet TAKE 1 TABLET BY MOUTH  DAILY (Patient taking differently: Take 40 mg by mouth daily. ) 90 tablet 3  . hydrOXYzine (VISTARIL) 25 MG capsule  Take 2 capsules (50 mg total) by mouth 3 (three) times daily as needed for anxiety. 30 capsule 0  . ketoconazole (NIZORAL) 2 % cream Apply 1 application topically 2 (two) times daily. (Patient taking differently: Apply 1 application topically daily as needed for irritation. ) 30 g 0  . magic mouthwash SOLN Take 15 mLs by mouth 4 (four) times daily. Swish and spit 480 mL 0  . MAGNESIUM PO Take 500 mg by mouth daily.     . metFORMIN (GLUCOPHAGE) 1000 MG tablet TAKE 1 TABLET BY MOUTH IN  THE MORNING FOR 7 DAYS THEN 2 TABLETS BY MOUTH IN THE  MORNING (Patient taking differently: 1,000 mg 2 (two) times daily with a meal. TAKE 1 TABLET BY MOUTH IN  THE MORNING FOR 7 DAYS THEN 2 TABLETS BY MOUTH IN THE  MORNING) 180 tablet 3  . metoprolol succinate (TOPROL-XL) 50 MG 24 hr tablet Take 50 mg by mouth daily. Take with or immediately following a meal.    . ondansetron (ZOFRAN ODT) 4 MG disintegrating tablet Take 1 tablet (4 mg total) by mouth every 8 (eight) hours as needed for nausea or vomiting. 20 tablet 0  . oxybutynin (DITROPAN) 5 MG tablet TAKE ONE-HALF TABLET (2.34m) BY  MOUTH TWO TIMES DAILY AS  NEEDED FOR BLADDER SPASMS (Patient taking differently: Take 2.5 mg by mouth 2 (two) times daily as needed for bladder spasms. TAKE ONE-HALF TABLET (2.571m BY  MOUTH TWO TIMES DAILY AS  NEEDED FOR BLADDER SPASMS) 30 tablet 5  . pantoprazole (PROTONIX) 40 MG tablet TAKE 1 TABLET (4066mBY MOUTH  DAILY (Patient taking differently: Take 40 mg by mouth daily. ) 90 tablet 2  . potassium chloride SA (K-DUR,KLOR-CON) 20 MEQ tablet TAKE 1 TABLET (57m39mY MOUTH  EVERY DAY (Patient taking differently: Take 20 mEq by mouth daily. ) 30 tablet 5  . PROCTO-MED HC 2.5 % rectal cream APPLY 3 TIMES DAILY AS  DIRECTED (Patient taking differently: Place 1 application rectally 3 (three) times daily as needed for hemorrhoids. ) 90 g 0  . tiZANidine (ZANAFLEX) 4 MG capsule Take one three times a day as needed for spasms (Patient taking  differently: Take 4 mg by mouth 3 (three) times daily as needed for muscle spasms. ) 30 capsule 0  . XARELTO 20 MG TABS tablet TAKE 1 TABLET BY MOUTH ONCE DAILY WITH SUPPER (Patient taking differently: Take 20 mg by  mouth daily with supper. ) 30 tablet 3  . amoxicillin-clavulanate (AUGMENTIN) 875-125 MG tablet Take 1 tablet by mouth 2 (two) times daily. (Patient not taking: Reported on 04/20/2019) 20 tablet 0  . cefdinir (OMNICEF) 300 MG capsule Take one capsule po BID for 10 days (Patient not taking: Reported on 04/20/2019) 20 capsule 0  . doxycycline (VIBRA-TABS) 100 MG tablet Take one tablet po BID for 10 days (Patient not taking: Reported on 04/20/2019) 20 tablet 0  . metoprolol succinate (TOPROL-XL) 100 MG 24 hr tablet Take 50 mg by mouth daily (Patient not taking: Reported on 04/20/2019) 45 tablet 3  . nitroGLYCERIN (NITROSTAT) 0.4 MG SL tablet DISSOLVE 1 TABLET UNDER THE TONGUE EVERY 5 MINUTES AS  NEEDED FOR CHEST PAIN. MAX  OF 3 TABLETS IN 15 MINUTES. CALL 911 IF PAIN PERSISTS. (Patient taking differently: Place 0.4 mg under the tongue every 5 (five) minutes as needed for chest pain. ) 75 tablet 3  . predniSONE (DELTASONE) 20 MG tablet Take 3 tablets po for 3 days, then take 2 tablet po for 3 days, then take one tablet po for 2 days (Patient not taking: Reported on 04/20/2019) 17 tablet 0    Results for orders placed or performed during the hospital encounter of 04/26/19 (from the past 48 hour(s))  CBC with Differential/Platelet     Status: Abnormal   Collection Time: 04/26/19 10:15 AM  Result Value Ref Range   WBC 8.7 4.0 - 10.5 K/uL   RBC 4.25 4.22 - 5.81 MIL/uL   Hemoglobin 11.3 (L) 13.0 - 17.0 g/dL   HCT 36.8 (L) 39.0 - 52.0 %   MCV 86.6 80.0 - 100.0 fL   MCH 26.6 26.0 - 34.0 pg   MCHC 30.7 30.0 - 36.0 g/dL   RDW 17.6 (H) 11.5 - 15.5 %   Platelets 275 150 - 400 K/uL   nRBC 0.0 0.0 - 0.2 %   Neutrophils Relative % 72 %   Neutro Abs 6.4 1.7 - 7.7 K/uL   Lymphocytes Relative 18 %    Lymphs Abs 1.5 0.7 - 4.0 K/uL   Monocytes Relative 8 %   Monocytes Absolute 0.7 0.1 - 1.0 K/uL   Eosinophils Relative 1 %   Eosinophils Absolute 0.1 0.0 - 0.5 K/uL   Basophils Relative 1 %   Basophils Absolute 0.1 0.0 - 0.1 K/uL   Immature Granulocytes 0 %   Abs Immature Granulocytes 0.02 0.00 - 0.07 K/uL    Comment: Performed at [facility], [address], [place], [place] [address]  Basic metabolic panel     Status: Abnormal   Collection Time: 04/26/19 10:15 AM  Result Value Ref Range   Sodium 140 135 - 145 mmol/L   Potassium 4.1 3.5 - 5.1 mmol/L   Chloride 102 98 - 111 mmol/L   CO2 26 22 - 32 mmol/L   Glucose, Bld 102 (H) 70 - 99 mg/dL   BUN 16 6 - 20 mg/dL   Creatinine, Ser 1.20 0.61 - 1.24 mg/dL   Calcium 9.0 8.9 - 10.3 mg/dL   GFR calc non Af Amer >60 >60 mL/min   GFR calc Af Amer >60 >60 mL/min   Anion gap 12 5 - 15    Comment: Performed at [facility], [address], [place], [place] [address]   No results found.  Review of Systems  Blood pressure 138/73, pulse (!) 52, temperature 97.8 F (36.6 C), temperature source Oral, resp. rate 20, SpO2 99 %. Physical Exam  Constitutional: He appears well-developed and well-nourished.  HENT:  Mouth/Throat: Oropharynx is clear and moist.  Eyes: Conjunctivae are normal. No scleral icterus.  Neck: No thyromegaly present.  Cardiovascular: Normal rate, regular rhythm and normal heart sounds.  No murmur heard. Respiratory: Effort normal and breath sounds normal.  GI:  Abdomen is obese.  On palpation is soft and nontender with organomegaly or masses.  He has small umbilical hernia which is completely reducible.  Musculoskeletal:        General: No edema.  Lymphadenopathy:    He has no cervical adenopathy.  Neurological: He is alert.  Skin: Skin is warm and dry.  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     Assessment/Plan  Bloating and persistent symptoms of GERD despite therapy. History of  colonic adenomas. Iron deficiency anemia. Diagnostic esophagogastroduodenoscopy and colonoscopy.  Hildred Laser, MD 04/28/2019, 7:19 AM

## 2019-04-28 NOTE — Op Note (Addendum)
[facility] Patient Name: Austin Woods Procedure Date: 04/28/2019 7:16 AM MRN: [id number] Date of Birth: [birth year] Attending MD: Hildred Laser , MD CSN: [id number] Age: 54 Admit Type: Outpatient Procedure:                Upper GI endoscopy Indications:              Iron deficiency anemia, Heartburn and bloating Providers:                Hildred Laser, MD, Lurline Del, RN, Raphael Gibney,                            Technician Referring MD:             Grace Bushy. Luking, MD Medicines:                Propofol per Anesthesia Complications:            No immediate complications. Estimated Blood Loss:     Estimated blood loss was minimal. Procedure:                Pre-Anesthesia Assessment:                           - Prior to the procedure, a History and Physical                            was performed, and patient medications and                            allergies were reviewed. The patient's tolerance of                            previous anesthesia was also reviewed. The risks                            and benefits of the procedure and the sedation                            options and risks were discussed with the patient.                            All questions were answered, and informed consent                            was obtained. Prior Anticoagulants: The patient                            last took Xarelto (rivaroxaban) 3 days prior to the                            procedure. ASA Grade Assessment: III - A patient                            with severe systemic disease. After reviewing the  risks and benefits, the patient was deemed in                            satisfactory condition to undergo the procedure.                           After obtaining informed consent, the endoscope was                            passed under direct vision. Throughout the                            procedure, the patient's blood pressure, pulse, and                        oxygen saturations were monitored continuously. The                            [id number] scope was introduced through the                            mouth, and advanced to the third part of duodenum.                            The upper GI endoscopy was accomplished without                            difficulty. The patient tolerated the procedure                            well. Scope In: 7:34:33 AM Scope Out: 7:55:37 AM Total Procedure Duration: 0 hours 21 minutes 4 seconds  Findings:      The examined esophagus was normal.      The Z-line was regular and was found 41 cm from the incisors.      A 3 cm hiatal hernia was present.      Evidence of a Nissen fundoplication was found in the cardia. The wrap       appeared loose.      A few small sessile polyps with no bleeding and no stigmata of recent       bleeding were found in the gastric fundus and in the gastric body.      The exam of the stomach was otherwise normal.      The duodenal bulb was normal.      A single 10 mm sessile polyp with no bleeding was found in the second       portion of the duodenum. Area was successfully injected with 4 mL Orise       gel for lesion assessment, and this injection appeared to lift the       lesion adequately. The polyp was removed with a hot snare. Resection and       retrieval were complete. The pathology specimen was placed into Bottle       Number 1. To stop active bleeding, three hemostatic clips were       successfully placed (MR conditional). There was no bleeding at the end  of the procedure. Impression:               - Normal esophagus.                           - Z-line regular, 41 cm from the incisors.                           - 3 cm hiatal hernia.                           - A Nissen fundoplication was found. The wrap                            appears loose.                           - A few gastric polyps.                           - Normal  duodenal bulb.                           - A single duodenal polyp. Resected and retrieved.                            Injected. Clips (MR conditional) were placed. Moderate Sedation:      Per Anesthesia Care Recommendation:           - Patient has a contact number available for                            emergencies. The signs and symptoms of potential                            delayed complications were discussed with the                            patient. Return to normal activities tomorrow.                            Written discharge instructions were provided to the                            patient.                           - Resume previous diet today.                           - Continue present medications.                           - Resume Xarelto (rivaroxaban) at prior dose in 3                            days.                           -  Await pathology results. Procedure Code(s):        --- Professional ---                           [phone], Esophagogastroduodenoscopy, flexible,                            transoral; with removal of tumor(s), polyp(s), or                            other lesion(s) by snare technique Diagnosis Code(s):        --- Professional ---                           K44.9, Diaphragmatic hernia without obstruction or                            gangrene                           Z98.890, Other specified postprocedural states                           K31.7, Polyp of stomach and duodenum                           D50.9, Iron deficiency anemia, unspecified                           R12, Heartburn CPT copyright 2019 [employer]. All rights reserved. The codes documented in this report are preliminary and upon coder review may  be revised to meet current compliance requirements. Hildred Laser, MD Hildred Laser, MD 04/28/2019 8:29:46 AM This report has been signed electronically. Number of Addenda: 0

## 2019-04-28 NOTE — Anesthesia Postprocedure Evaluation (Signed)
Anesthesia Post Note  Patient: ELIGAH ANELLO  Procedure(s) Performed: ESOPHAGOGASTRODUODENOSCOPY (EGD) WITH PROPOFOL (N/A ) COLONOSCOPY WITH PROPOFOL (N/A ) POLYPECTOMY  Patient location during evaluation: PACU Anesthesia Type: MAC Level of consciousness: awake, oriented, awake and alert and patient cooperative Pain management: pain level controlled Vital Signs Assessment: post-procedure vital signs reviewed and stable Respiratory status: spontaneous breathing, respiratory function stable and nonlabored ventilation Postop Assessment: no apparent nausea or vomiting Anesthetic complications: no     Last Vitals:  Vitals:   04/28/19 0714  BP: 138/73  Pulse: (!) 52  Resp: 20  Temp: 36.6 C  SpO2: 99%    Last Pain:  Vitals:   04/28/19 0714  TempSrc: Oral                 Jennifier Smitherman

## 2019-04-28 NOTE — Op Note (Signed)
[facility] Patient Name: Austin Woods Procedure Date: 04/28/2019 7:58 AM MRN: [id number] Date of Birth: [birth year] Attending MD: Hildred Laser , MD CSN: [id number] Age: 54 Admit Type: Ambulatory Procedure:                Colonoscopy Indications:              Iron deficiency anemia; history of polyps. Providers:                Hildred Laser, MD, Lurline Del, RN, Raphael Gibney,                            Technician Referring MD:             Grace Bushy. Luking, MD Medicines:                Propofol per Anesthesia Complications:            No immediate complications. Estimated Blood Loss:     Estimated blood loss was minimal. Procedure:                Pre-Anesthesia Assessment:                           - Prior to the procedure, a History and Physical                            was performed, and patient medications and                            allergies were reviewed. The patient's tolerance of                            previous anesthesia was also reviewed. The risks                            and benefits of the procedure and the sedation                            options and risks were discussed with the patient.                            All questions were answered, and informed consent                            was obtained. Prior Anticoagulants: The patient                            last took Xarelto (rivaroxaban) 3 days prior to the                            procedure. ASA Grade Assessment: III - A patient                            with severe systemic disease. After reviewing the  risks and benefits, the patient was deemed in                            satisfactory condition to undergo the procedure.                           After obtaining informed consent, the colonoscope                            was passed under direct vision. Throughout the                            procedure, the patient's blood pressure, pulse, and               oxygen saturations were monitored continuously. The                            [id number]-[id number] scope was introduced through                            the anus and advanced to the the terminal ileum,                            with identification of the appendiceal orifice and                            IC valve. The colonoscopy was performed without                            difficulty. The patient tolerated the procedure                            well. The quality of the bowel preparation was                            good. The terminal ileum, ileocecal valve,                            appendiceal orifice, and rectum were photographed. Scope In: 8:01:53 AM Scope Out: 8:15:16 AM Scope Withdrawal Time: 0 hours 9 minutes 16 seconds  Total Procedure Duration: 0 hours 13 minutes 23 seconds  Findings:      Skin tags were found on perianal exam.      The terminal ileum appeared normal.      Two polyps were found in the splenic flexure and ascending colon. The       polyps were diminutive in size. These were biopsied with a cold forceps       for histology. The pathology specimen was placed into Bottle Number 2.      Two small-mouthed diverticula were found in the hepatic flexure.      The exam was otherwise normal throughout the examined colon.      External hemorrhoids were found during retroflexion. The hemorrhoids       were small. Impression:               -  Perianal skin tags found on perianal exam.                           - The examined portion of the ileum was normal.                           - Two diminutive polyps at the splenic flexure and                            in the ascending colon. Biopsied.                           - Diverticulosis at the hepatic flexure.                           - External hemorrhoids. Moderate Sedation:      Per Anesthesia Care Recommendation:           - Patient has a contact number available for                             emergencies. The signs and symptoms of potential                            delayed complications were discussed with the                            patient. Return to normal activities tomorrow.                            Written discharge instructions were provided to the                            patient.                           - Resume previous diet today.                           - Continue present medications.                           - Await pathology results.                           - Resume Xarelto (rivaroxaban) at prior dose in 3                            days.                           - Repeat colonoscopy is recommended. The                            colonoscopy date will be determined after pathology  results from today's exam become available for                            review. Procedure Code(s):        --- Professional ---                           [phone], Colonoscopy, flexible; with biopsy, single                            or multiple Diagnosis Code(s):        --- Professional ---                           K64.4, Residual hemorrhoidal skin tags                           K63.5, Polyp of colon                           D50.9, Iron deficiency anemia, unspecified                           K57.30, Diverticulosis of large intestine without                            perforation or abscess without bleeding CPT copyright 2019 [employer]. All rights reserved. The codes documented in this report are preliminary and upon coder review may  be revised to meet current compliance requirements. Hildred Laser, MD Hildred Laser, MD 04/28/2019 8:34:45 AM This report has been signed electronically. Number of Addenda: 0

## 2019-04-28 NOTE — Transfer of Care (Signed)
Immediate Anesthesia Transfer of Care Note  Patient: Austin Woods  Procedure(s) Performed: ESOPHAGOGASTRODUODENOSCOPY (EGD) WITH PROPOFOL (N/A ) COLONOSCOPY WITH PROPOFOL (N/A ) POLYPECTOMY  Patient Location: PACU  Anesthesia Type:MAC  Level of Consciousness: awake, alert , oriented and patient cooperative  Airway & Oxygen Therapy: Patient Spontanous Breathing  Post-op Assessment: Report given to RN, Post -op Vital signs reviewed and stable and Patient moving all extremities X 4  Post vital signs: Reviewed and stable  Last Vitals:  Vitals Value Taken Time  BP    Temp    Pulse 73 04/28/19 0823  Resp    SpO2 95 % 04/28/19 0823  Vitals shown include unvalidated device data.  Last Pain:  Vitals:   04/28/19 0714  TempSrc: Oral         Complications: No apparent anesthesia complications

## 2019-04-29 ENCOUNTER — Other Ambulatory Visit (INDEPENDENT_AMBULATORY_CARE_PROVIDER_SITE_OTHER): Payer: Self-pay | Admitting: Nurse Practitioner

## 2019-04-29 NOTE — Telephone Encounter (Signed)
Thayer Headings, pls manage rx request and follow up on repeat CBC, iron studies thx

## 2019-05-01 ENCOUNTER — Telehealth (INDEPENDENT_AMBULATORY_CARE_PROVIDER_SITE_OTHER): Payer: Self-pay | Admitting: Internal Medicine

## 2019-05-01 LAB — SURGICAL PATHOLOGY

## 2019-05-01 NOTE — Telephone Encounter (Signed)
Patient left message stating he had a procedure done on Friday, 1-22, he is feeling nausea - could not work last night and needs a note for work - please advise - [phone]

## 2019-05-01 NOTE — Telephone Encounter (Signed)
  Dr.Rehman said to give the patient the work note only for the night he missed. Lelon Frohlich will you please do a note for the patient.

## 2019-05-02 ENCOUNTER — Encounter (INDEPENDENT_AMBULATORY_CARE_PROVIDER_SITE_OTHER): Payer: Self-pay | Admitting: *Deleted

## 2019-05-02 NOTE — Telephone Encounter (Signed)
Patient is requesting a note for Sunday , Monday Tuesday and Wednesday night for work.  He states that he has had bad diarrhea since procedure. He started Imodium on 05/01/19 and is doing better, his job requires him contanly walking per the patient.  Per Dr.Rehman a noted for Sunday , Monday and Tuesday will be provided and no further days. Patient will need to have a OV if additional days are needed. Or he can see his PCP.  If patient states that he is running a fever he will need a GI Pathogen.  Patient was called and made aware.

## 2019-05-10 ENCOUNTER — Encounter: Payer: Self-pay | Admitting: Family Medicine

## 2019-05-11 ENCOUNTER — Ambulatory Visit (INDEPENDENT_AMBULATORY_CARE_PROVIDER_SITE_OTHER): Payer: BC Managed Care – PPO | Admitting: Family Medicine

## 2019-05-11 ENCOUNTER — Other Ambulatory Visit: Payer: Self-pay

## 2019-05-11 DIAGNOSIS — I10 Essential (primary) hypertension: Secondary | ICD-10-CM | POA: Diagnosis not present

## 2019-05-11 DIAGNOSIS — E7800 Pure hypercholesterolemia, unspecified: Secondary | ICD-10-CM

## 2019-05-11 DIAGNOSIS — E119 Type 2 diabetes mellitus without complications: Secondary | ICD-10-CM | POA: Diagnosis not present

## 2019-05-11 MED ORDER — POTASSIUM CHLORIDE CRYS ER 20 MEQ PO TBCR: EXTENDED_RELEASE_TABLET | ORAL | 1 refills | Status: DC

## 2019-05-11 MED ORDER — METFORMIN HCL 1000 MG PO TABS: 1000.0000 mg | ORAL_TABLET | Freq: Two times a day (BID) | ORAL | 1 refills | Status: DC

## 2019-05-11 MED ORDER — PANTOPRAZOLE SODIUM 40 MG PO TBEC: DELAYED_RELEASE_TABLET | ORAL | 1 refills | Status: DC

## 2019-05-11 MED ORDER — FUROSEMIDE 40 MG PO TABS: 40.0000 mg | ORAL_TABLET | Freq: Every day | ORAL | 1 refills | Status: DC

## 2019-05-11 MED ORDER — ATORVASTATIN CALCIUM 10 MG PO TABS: 10.0000 mg | ORAL_TABLET | Freq: Every day | ORAL | 5 refills | Status: DC

## 2019-05-11 NOTE — Progress Notes (Signed)
   Subjective:    Patient ID: Austin Woods, male    DOB: [birth year], 54 y.o.   MRN: [id number]  Diabetes He presents for his follow-up diabetic visit. He has type 2 diabetes mellitus. Risk factors for coronary artery disease include diabetes mellitus, dyslipidemia, hypertension and obesity. Current diabetic treatment includes oral agent (monotherapy). He is compliant with treatment all of the time. His weight is stable. He is following a diabetic diet.   Patient states he is still tired all the time- he was told his iron was low and went to GI for colonoscopy but he is still tired all the time.  Virtual Visit via Video Note  I connected with Austin Woods on 05/11/19 at  3:00 PM EST by a video enabled telemedicine application and verified that I am speaking with the correct person using two identifiers.  Location: Patient: home Provider: office   I discussed the limitations of evaluation and management by telemedicine and the availability of in person appointments. The patient expressed understanding and agreed to proceed.  History of Present Illness:    Observations/Objective:   Assessment and Plan:   Follow Up Instructions:    I discussed the assessment and treatment plan with the patient. The patient was provided an opportunity to ask questions and all were answered. The patient agreed with the plan and demonstrated an understanding of the instructions.   The patient was advised to call back or seek an in-person evaluation if the symptoms worsen or if the condition fails to improve as anticipated.  I provided 30 minutes of non-face-to-face time during this encounter.  Blood pressure medicine and blood pressure levels reviewed today with patient. Compliant with blood pressure medicine. States does not miss a dose. No obvious side effects. Blood pressure generally good when checked elsewhere. Watching salt intake.   Patient continues to take lipid medication regularly. No  obvious side effects from it. Generally does not miss a dose. Prior blood work results are reviewed with patient. Patient continues to work on fat intake in diet  Patient claims compliance with diabetes medication. No obvious side effects. Reports no substantial low sugar spells. Most numbers are generally in good range when checked fasting. Generally does not miss a dose of medication. Watching diabetic diet closely      Review of Systems No headache, no major weight loss or weight gain, no chest pain no back pain abdominal pain no change in bowel habits complete ROS otherwise negative     Objective:   Physical Exam Virtual       Assessment & Plan:  Impression #1 recent iron deficiency anemia diagnosed with GI work-up via Dr. Melony Overly.  Patient states physician discovered gastritis.  Patient on iron supplement now.  His hemoglobin is slowly rising.  2.  Hypertension apparent good control blood pressure is good when reviewed elsewhere  3.  Type 2 diabetes.  Glucose is good discussed.  Needs an A1c discussed will order blood work  4.  Hyperlipidemia.  Ongoing discussed maintain same dose  Follow-up in 6 months diet exercise discussed/further recommendations based on blood work

## 2019-05-12 ENCOUNTER — Encounter: Payer: Self-pay | Admitting: Family Medicine

## 2019-06-15 DIAGNOSIS — E669 Obesity, unspecified: Secondary | ICD-10-CM | POA: Diagnosis not present

## 2019-06-16 ENCOUNTER — Other Ambulatory Visit (INDEPENDENT_AMBULATORY_CARE_PROVIDER_SITE_OTHER): Payer: Self-pay | Admitting: *Deleted

## 2019-06-16 DIAGNOSIS — D509 Iron deficiency anemia, unspecified: Secondary | ICD-10-CM

## 2019-06-16 MED ORDER — IRON 325 (65 FE) MG PO TABS: 1.0000 | ORAL_TABLET | Freq: Every day | ORAL | 1 refills | Status: DC

## 2019-06-29 ENCOUNTER — Other Ambulatory Visit: Payer: Self-pay | Admitting: Internal Medicine

## 2019-06-30 NOTE — Telephone Encounter (Signed)
Prescription refill request for Xarelto received.   Last office visit: 03/01/2019 Weight: 121.6 kg Age: 54 y.o. Scr: 1.20 04/26/2019 CrCl: 118.2 ml/min  Prescription refill sent.

## 2019-07-21 ENCOUNTER — Ambulatory Visit (INDEPENDENT_AMBULATORY_CARE_PROVIDER_SITE_OTHER)
Admission: RE | Admit: 2019-07-21 | Discharge: 2019-07-21 | Disposition: A | Discharge status: Home / Self Care | Payer: BC Managed Care – PPO | Source: Ambulatory Visit

## 2019-07-21 DIAGNOSIS — J209 Acute bronchitis, unspecified: Secondary | ICD-10-CM

## 2019-07-21 DIAGNOSIS — J0100 Acute maxillary sinusitis, unspecified: Secondary | ICD-10-CM

## 2019-07-21 DIAGNOSIS — Z87891 Personal history of nicotine dependence: Secondary | ICD-10-CM

## 2019-07-21 MED ORDER — LEVOCETIRIZINE DIHYDROCHLORIDE 5 MG PO TABS: 5.0000 mg | ORAL_TABLET | Freq: Every evening | ORAL | 1 refills | Status: DC

## 2019-07-21 MED ORDER — AMOXICILLIN 875 MG PO TABS: 875.0000 mg | ORAL_TABLET | Freq: Two times a day (BID) | ORAL | 0 refills | Status: AC

## 2019-07-21 MED ORDER — ALBUTEROL SULFATE HFA 108 (90 BASE) MCG/ACT IN AERS: 2.0000 | INHALATION_SPRAY | Freq: Four times a day (QID) | RESPIRATORY_TRACT | 0 refills | Status: AC | PRN

## 2019-07-21 MED ORDER — PREDNISONE 10 MG (21) PO TBPK: ORAL_TABLET | Freq: Every day | ORAL | 0 refills | Status: AC

## 2019-07-21 NOTE — ED Provider Notes (Signed)
[facility] VIDEO VISITS    CSN: [id number] Arrival date & time: 07/21/19  1134      History   Chief Complaint No chief complaint on file.   HPI Austin Woods is a 54 y.o. male.   This visit was conducted via video and audio as a virtual visit.  Patient notified that if there is something that I think that we need to evaluate in person, we may not be able to treat over video visit.  This visit was conducted in compliance with COVID-19 distancing in place.  Patient reports he has a history of seasonal allergies.  Reports that the pollen lately has been giving him a hard time.  Reports that he has been using his rescue inhaler 3-4 times a day.  Reports that he is experiencing sinus tenderness and pressure to the maxillary sinuses.  Has been having headaches as well.  He is taken ibuprofen and has been taking an over-the-counter daily antihistamine.  Reports that this is no longer helping.  Reports that he is coughing up yellow thick phlegm.  Reports that his nose is not really running but that he is congested.  Reports that this has been going on for the last 6 days.  Denies any fever, nausea, vomiting, diarrhea, rash, other symptoms.  ROS per HPI  The history is provided by the patient.    Past Medical History:  Diagnosis Date  . A-fib ([place])   . Anemia    iron def anemia  . Arthritis   . Asthma   . Coronary artery disease   . Dysrhythmia    in and out Afib  . GERD (gastroesophageal reflux disease)   . Headache   . High cholesterol   . History of DVT (deep vein thrombosis)    a. left leg following ablation for SVT  . Hypertension   . Persistent atrial fibrillation ([place])    a. Dx 08/2012, xarelto initiated; b. 09/2012 Tikosyn initiated -> DCCV -> Sinus c. PVI 03/2013 d. PVI 03/2014  . Sleep apnea    USES  CPAP   . Type 2 diabetes mellitus without complication, without long-term current use of insulin ([place]) 02/16/2018  . Wolff-Parkinson-White (WPW) syndrome    a. s/p RFCA 2001  by Dr Caryl Comes, pt reports complicated by post procedure DVT    Patient Active Problem List   Diagnosis Date Noted  . Iron deficiency anemia 03/21/2019  . History of colonic polyps 03/21/2019  . Upper abdominal pain 02/09/2019  . Nausea without vomiting 02/09/2019  . Abdominal bloating 02/09/2019  . Anemia 02/09/2019  . Type 2 diabetes mellitus without complication, without long-term current use of insulin ([place]) 02/16/2018  . 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 Chest pain in adult 07/10/2016  . Atypical chest pain 07/10/2016  . Pseudoaneurysm following procedure ([place]) 03/03/2016  . A-fib ([place]) 02/18/2016  . Chest pain 01/30/2016  . Persistent atrial fibrillation ([place])   . Unstable angina pectoris ([place])   . Asthma, chronic 08/26/2014  . Palpitations 06/28/2014  . Atrial fibrillation with RVR ([place]) 02/02/2014  . Acute on chronic systolic CHF (congestive heart failure) ([place]) 02/02/2014  . Obstructive sleep apnea 10/12/2012  . WPW (Wolff-Parkinson-White syndrome) 09/05/2012  . Hypokalemia 09/05/2012  . Secondary cardiomyopathy ([place]) 09/03/2012  . Essential hypertension, benign 09/03/2012  . Fatigue 08/15/2012  . Morbid obesity ([place]) 08/15/2012  . Atrial fibrillation ([place]) 08/07/2012    Past Surgical History:  Procedure Laterality Date  . ATRIAL FIBRILLATION ABLATION  03/23/2013  PVI by DR Rayann Heman for afib  . ATRIAL FIBRILLATION ABLATION N/A 03/23/2013   Procedure: ATRIAL FIBRILLATION ABLATION;  Surgeon: Coralyn Mark, MD;  Location: [facility] CATH LAB;  Service: Cardiovascular;  Laterality: N/A;  . ATRIAL FIBRILLATION ABLATION N/A 03/27/2014   PVI Dr Rayann Heman  . BACK SURGERY    . CARDIAC CATHETERIZATION N/A 02/03/2016   Procedure: Left Heart Cath and Coronary Angiography;  Surgeon: Nelva Bush, MD;  Location: [facility] CV LAB;  Service: Cardiovascular;  Laterality: N/A;  . CARDIAC CATHETERIZATION N/A 02/03/2016   Procedure: Intravascular Pressure Wire/FFR Study;  Surgeon:  Nelva Bush, MD;  Location: [facility] CV LAB;  Service: Cardiovascular;  Laterality: N/A;  . CARDIAC CATHETERIZATION N/A 02/03/2016   Procedure: Coronary Stent Intervention;  Surgeon: Nelva Bush, MD;  Location: [facility] CV LAB;  Service: Cardiovascular;  Laterality: N/A;  . CARDIAC SURGERY    . CARDIOVERSION N/A 08/25/2012   Procedure: CARDIOVERSION;  Surgeon: Thayer Headings, MD;  Location: [facility] ENDOSCOPY;  Service: Cardiovascular;  Laterality: N/A;  . CARDIOVERSION N/A 09/06/2012   Procedure: CARDIOVERSION/Bedside;  Surgeon: Carlena Bjornstad, MD;  Location: [place] [facility];  Service: Cardiovascular;  Laterality: N/A;  . CARDIOVERSION N/A 01/12/2014   Procedure: CARDIOVERSION;  Surgeon: Pixie Casino, MD;  Location: [facility] ENDOSCOPY;  Service: Cardiovascular;  Laterality: N/A;  . CARDIOVERSION N/A 01/30/2016   Procedure: CARDIOVERSION;  Surgeon: Sueanne Margarita, MD;  Location: [facility] ENDOSCOPY;  Service: Cardiovascular;  Laterality: N/A;  . CARDIOVERSION N/A 04/14/2016   Procedure: CARDIOVERSION;  Surgeon: Pixie Casino, MD;  Location: [facility] ENDOSCOPY;  Service: Cardiovascular;  Laterality: N/A;  . COLONOSCOPY N/A 12/04/2015   Procedure: COLONOSCOPY;  Surgeon: Rogene Houston, MD;  [facility];  Service: Endoscopy;  Laterality: N/A;  730  . COLONOSCOPY WITH PROPOFOL N/A 04/28/2019   Procedure: COLONOSCOPY WITH PROPOFOL;  Surgeon: Rogene Houston, MD;  [facility];  Service: Endoscopy;  Laterality: N/A;  . CORONARY ANGIOPLASTY    . CORONARY ANGIOPLASTY WITH STENT PLACEMENT    . ELECTROPHYSIOLOGIC STUDY N/A 02/18/2016   Procedure: Atrial Fibrillation Ablation;  Surgeon: Thompson Grayer, MD;  Location: [facility] CV LAB;  Service: Cardiovascular;  Laterality: N/A;  . ESOPHAGOGASTRODUODENOSCOPY (EGD) WITH PROPOFOL N/A 04/28/2019   Procedure: ESOPHAGOGASTRODUODENOSCOPY (EGD) WITH PROPOFOL;  Surgeon: Rogene Houston, MD;  [facility];  Service: Endoscopy;  Laterality: N/A;  7:30   . implantable loop recorder removal  03/01/2019   MDT Reveal LINQ removed in office  . INGUINAL HERNIA REPAIR Bilateral 06/04/2017   Procedure: OPEN BILATERAL INGUINAL HERNIA REPAIR;  Surgeon: Judeth Horn, MD;  Location: [facility];  Service: General;  Laterality: Bilateral;  . LOOP RECORDER IMPLANT N/A 06/29/2014   Procedure: LOOP RECORDER IMPLANT;  Surgeon: Thompson Grayer, MD;  Location[facility] CATH LAB;  Service: Cardiovascular;  Laterality: N/A;  . NOSE SURGERY     sleep apnea surgery  . PERIPHERAL VASCULAR CATHETERIZATION N/A 03/13/2016   Procedure: Thrombin Injection;  Surgeon: Serafina Mitchell, MD;  Location: [facility] CV LAB;  Service: Cardiovascular;  Laterality: N/A;  . POLYPECTOMY  04/28/2019   Procedure: POLYPECTOMY;  Surgeon: Rogene Houston, MD;  [facility];  Service: Endoscopy;;  duodenum;splenic flexure;  . STOMACH SURGERY     reflux, fundoplication  . TEE WITHOUT CARDIOVERSION N/A 08/25/2012   Procedure: TRANSESOPHAGEAL ECHOCARDIOGRAM (TEE);  Surgeon: Thayer Headings, MD;  Location: [facility];  Service: Cardiovascular;  Laterality: N/A;  . TEE WITHOUT CARDIOVERSION  03/23/2013   DR ROSS  . TEE WITHOUT CARDIOVERSION N/A 03/23/2013   Procedure: TRANSESOPHAGEAL ECHOCARDIOGRAM (TEE);  Surgeon: Fay Records, MD;  Location: [facility] ENDOSCOPY;  Service: Cardiovascular;  Laterality: N/A;  . TEE WITHOUT CARDIOVERSION N/A 03/26/2014   Procedure: TRANSESOPHAGEAL ECHOCARDIOGRAM (TEE);  Surgeon: Josue Hector, MD;  Location: [facility] ENDOSCOPY;  Service: Cardiovascular;  Laterality: N/A;  . TONSILLECTOMY         Home Medications    Prior to Admission medications   Medication Sig Start Date End Date Taking? Authorizing Provider  acetaminophen (TYLENOL) 500 MG tablet Take 1,000 mg by mouth every 6 (six) hours as needed for moderate pain or headache.     [provider]  albuterol (VENTOLIN HFA) 108 (90 Base) MCG/ACT inhaler Inhale 2 puffs into the lungs every 6 (six) hours as  needed for wheezing or shortness of breath. 07/21/19   Faustino Congress, NP  amoxicillin (AMOXIL) 875 MG tablet Take 1 tablet (875 mg total) by mouth 2 (two) times daily for 10 days. 07/21/19 07/31/19  Faustino Congress, NP  atorvastatin (LIPITOR) 10 MG tablet Take 1 tablet (10 mg total) by mouth daily. 05/11/19   [name], MD  azelastine (ASTELIN) 0.1 % nasal spray Place 2 sprays into both nostrils 2 (two) times daily. 10/04/18   [name], MD  blood glucose meter kit and supplies Dispense based on patient and insurance preference. Use to check blood sugar once a day(FOR ICD-10 E10.9, E11.9). 02/16/18   [name], MD  chlorzoxazone (PARAFON) 500 MG tablet Take 1 tablet (500 mg total) by mouth 3 (three) times daily as needed for muscle spasms. 12/15/17   [name], MD  Cholecalciferol (VITAMIN D) 2000 UNITS CAPS Take 4,000 Units by mouth daily.     [provider]  cyanocobalamin 100 MCG tablet Take 100 mcg by mouth daily.    [provider]  dofetilide (TIKOSYN) 500 MCG capsule TAKE 1 CAPSULE BY MOUTH TWO TIMES DAILY Patient taking differently: Take 500 mcg by mouth 2 (two) times daily.  01/16/19   Allred, Jeneen Rinks, MD  enalapril (VASOTEC) 10 MG tablet Take 1 tablet (10 mg total) by mouth 2 (two) times daily. 05/29/16   Allred, Jeneen Rinks, MD  famotidine (PEPCID) 20 MG tablet Take 1 tablet (20 mg total) by mouth at bedtime. 04/14/19   Minus Liberty, PA-C  Ferrous Sulfate (IRON) 325 (65 Fe) MG TABS Take 1 tablet (325 mg total) by mouth daily. 06/16/19   Rehman, Mechele Dawley, MD  Fluticasone-Salmeterol (ADVAIR DISKUS) 250-50 MCG/DOSE AEPB Inhale 1 puff into the lungs every 12 (twelve) hours. 07/29/17   [name], MD  furosemide (LASIX) 40 MG tablet Take 1 tablet (40 mg total) by mouth daily. 05/11/19   [name], MD  hydrOXYzine (VISTARIL) 25 MG capsule Take 2 capsules (50 mg total) by mouth 3 (three) times daily as needed for anxiety. 07/11/16   Kathie Dike, MD  ketoconazole (NIZORAL) 2 % cream Apply 1 application topically 2 (two) times daily. Patient taking differently: Apply 1 application topically daily as needed for irritation.  12/24/17   [name], MD  levocetirizine (XYZAL) 5 MG tablet Take 1 tablet (5 mg total) by mouth every evening. 07/21/19   Faustino Congress, NP  MAGNESIUM PO Take 500 mg by mouth daily.     [provider]  metFORMIN (GLUCOPHAGE) 1000 MG tablet Take 1 tablet (1,000 mg total) by mouth 2 (two) times daily with  a meal. 05/11/19   [name], MD  metoprolol succinate (TOPROL-XL) 50 MG 24 hr tablet Take 50 mg by mouth daily. Take with or immediately following a meal.    [provider]  nitroGLYCERIN (NITROSTAT) 0.4 MG SL tablet DISSOLVE 1 TABLET UNDER THE TONGUE EVERY 5 MINUTES AS  NEEDED FOR CHEST PAIN. MAX  OF 3 TABLETS IN 15 MINUTES. CALL 911 IF PAIN PERSISTS. Patient taking differently: Place 0.4 mg under the tongue every 5 (five) minutes as needed for chest pain.  01/09/19   Herminio Commons, MD  ondansetron (ZOFRAN ODT) 4 MG disintegrating tablet Take 1 tablet (4 mg total) by mouth every 8 (eight) hours as needed for nausea or vomiting. 12/15/17   [name], MD  oxybutynin (DITROPAN) 5 MG tablet TAKE ONE-HALF TABLET (2.53m) BY  MOUTH TWO TIMES DAILY AS  NEEDED FOR BLADDER SPASMS Patient taking differently: Take 2.5 mg by mouth 2 (two) times daily as needed for bladder spasms. TAKE ONE-HALF TABLET (2.519m BY  MOUTH TWO TIMES DAILY AS  NEEDED FOR BLADDER SPASMS 07/29/17   LuMikey KirschnerMD  pantoprazole (PROTONIX) 40 MG tablet TAKE 1 TABLET (4041mBY MOUTH  DAILY 05/11/19   LukMikey KirschnerD  potassium chloride SA (KLOR-CON) 20 MEQ tablet TAKE 1 TABLET (81m27mY MOUTH  EVERY DAY 05/11/19   LukiMikey Kirschner  predniSONE (STERAPRED UNI-PAK 21 TAB) 10 MG (21) TBPK tablet Take by mouth daily for 6 days. Take 6 tablets on day 1, 5 tablets on day 2, 4 tablets on day 3, 3  tablets on day 4, 2 tablets on day 5, 1 tablet on day 6 07/21/19 07/27/19  MattFaustino Congress  PROCTO-MED HC [facility] % rectal cream APPLY 3 TIMES DAILY AS  DIRECTED Patient taking differently: Place 1 application rectally 3 (three) times daily as needed for hemorrhoids.  04/29/17   LukiMikey Kirschner  tiZANidine (ZANAFLEX) 4 MG capsule Take one three times a day as needed for spasms Patient taking differently: Take 4 mg by mouth 3 (three) times daily as needed for muscle spasms.  07/01/17   LukiMikey Kirschner  XARELTO 20 MG TABS tablet TAKE 1 TABLET BY MOUTH ONCE DAILY WITH SUPPER 06/30/19   AllrThompson Grayer    Family History Family History  Problem Relation Age of Onset  . CAD Father 50  63Diabetes Father   . Heart attack Father   . Transient ischemic attack Father   . Vascular Disease Father   . Diabetes Mother   . Anemia Mother   . Gout Mother   . Aneurysm Maternal Grandmother   . Stroke Maternal Grandfather   . Diabetes Paternal Grandmother   . Stroke Paternal Grandfather   . Diabetes Brother     Social History Social History   Tobacco Use  . Smoking status: Former Smoker    Packs/day: 1.00    Years: 7.00    Pack years: 7.00    Types: Cigarettes    Start date: 05/19/2003    Quit date: 08/06/2010    Years since quitting: 8.9  . Smokeless tobacco: Never Used  Substance Use Topics  . Alcohol use: No    Alcohol/week: 0.0 standard drinks  . Drug use: No     Allergies   Patient has no known allergies.   Review of Systems Review of Systems   Physical Exam Triage Vital Signs ED Triage Vitals  Enc Vitals Group  BP      Pulse      Resp      Temp      Temp src      SpO2      Weight      Height      Head Circumference      Peak Flow      Pain Score      Pain Loc      Pain Edu?      Excl. in [place]?    No data found.  Updated Vital Signs There were no vitals taken for this visit.  Visual Acuity Right Eye Distance:   Left Eye Distance:   Bilateral  Distance:    Right Eye Near:   Left Eye Near:    Bilateral Near:     Physical Exam No physical exam was able to be performed due to the nature of virtual visit.  UC Treatments / Results  Labs (all labs ordered are listed, but only abnormal results are displayed) Labs Reviewed - No data to display  EKG   Radiology No results found.  Procedures Procedures (including critical care time)  Medications Ordered in UC Medications - No data to display  Initial Impression / Assessment and Plan / UC Course  I have reviewed the triage vital signs and the nursing notes.  Pertinent labs & imaging results that were available during my care of the patient were reviewed by me and considered in my medical decision making (see chart for details).    Acute sinusitis/acute bronchitis: Presenting with cough, headache, sinus pain and pressure for the last 6 days.  Using albuterol inhaler 3-4 times a day.  Prescribed refill of albuterol inhaler.  Also prescribed a prednisone taper as well as Xyzal antihistamine regimen for patient to begin.  Lastly, prescribed amoxicillin 875 twice daily x10 days to treat sinusitis.  Patient instructed if he is not feeling better over the next 2 days, that he needs to come and be evaluated in person at primary care or with an urgent care near him.  Patient instructed that if he is having trouble swallowing, trouble breathing, other concerning symptoms that he is to follow-up with the ER.  Patient verbalizes understanding is in agreement with treatment plan.  10 minutes of nonface-to-face time spent on this encounter.  Final Clinical Impressions(s) / UC Diagnoses   Final diagnoses:  Acute non-recurrent maxillary sinusitis  Acute bronchitis, unspecified organism   Discharge Instructions   None    ED Prescriptions    Medication Sig Dispense Auth. Provider   levocetirizine (XYZAL) 5 MG tablet Take 1 tablet (5 mg total) by mouth every evening. 30 tablet Faustino Congress, NP   predniSONE (STERAPRED UNI-PAK 21 TAB) 10 MG (21) TBPK tablet Take by mouth daily for 6 days. Take 6 tablets on day 1, 5 tablets on day 2, 4 tablets on day 3, 3 tablets on day 4, 2 tablets on day 5, 1 tablet on day 6 21 tablet Faustino Congress, NP   amoxicillin (AMOXIL) 875 MG tablet Take 1 tablet (875 mg total) by mouth 2 (two) times daily for 10 days. 20 tablet Faustino Congress, NP   albuterol (VENTOLIN HFA) 108 (90 Base) MCG/ACT inhaler Inhale 2 puffs into the lungs every 6 (six) hours as needed for wheezing or shortness of breath. 8 g Faustino Congress, NP     PDMP not reviewed this encounter.   Faustino Congress, NP 07/21/19 1201

## 2019-07-21 NOTE — Discharge Instructions (Addendum)
You have a sinus infection and bronchitis.  I have sent in amoxicillin for you to take twice daily for 10 days.  Take all of the medication, even if you are feeling better.  I have sent in a refill of your albuterol inhaler as well as a prednisone taper for you to begin today.  I would also like for you to start taking levocetirizine daily as your antihistamine regimen.  If you are not feeling better over the next 2 days, you need to come in to be evaluated in person at urgent care near you or with your primary care provider.  If you are having trouble swallowing, breathing, or other concerning symptoms you her to follow-up with the ER for further evaluation and treatment.

## 2019-07-24 ENCOUNTER — Other Ambulatory Visit: Payer: Self-pay | Admitting: Cardiovascular Disease

## 2019-07-27 ENCOUNTER — Encounter: Payer: Self-pay | Admitting: Family Medicine

## 2019-08-01 ENCOUNTER — Encounter: Payer: Self-pay | Admitting: Family Medicine

## 2019-08-01 ENCOUNTER — Ambulatory Visit: Payer: BC Managed Care – PPO | Admitting: Family Medicine

## 2019-08-01 ENCOUNTER — Other Ambulatory Visit: Payer: Self-pay

## 2019-08-01 VITALS — BP 128/82 | HR 64 | Temp 97.3°F | Ht 73.0 in | Wt 270.0 lb

## 2019-08-01 DIAGNOSIS — M7022 Olecranon bursitis, left elbow: Secondary | ICD-10-CM | POA: Diagnosis not present

## 2019-08-01 MED ORDER — FLUCONAZOLE 150 MG PO TABS: 150.0000 mg | ORAL_TABLET | Freq: Once | ORAL | 0 refills | Status: AC

## 2019-08-01 MED ORDER — CEPHALEXIN 500 MG PO CAPS: 500.0000 mg | ORAL_CAPSULE | Freq: Three times a day (TID) | ORAL | 0 refills | Status: DC

## 2019-08-01 NOTE — Progress Notes (Signed)
Patient ID: Austin Woods, male    DOB: November 02, 1965, 54 y.o.   MRN: [id number]   Chief Complaint  Patient presents with  . left elbow swelling    since Sunday   Subjective:   HPI  Pt seen for left elbow swelling and pain.  H/o Afib, DM2, CAD, WPW syndrome. Pt having redness, pain on left elbow.  Not recalling injury or trauma to elbow. Pt is left handed.  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  Not leaning on elbow long periods of time.  No bite or abrasion to the area.  Denies fever.  Has not taken anything for this. No h/o bursitis in past.   Medical History Right[place] has a past medical history of A-fib ([place]), Anemia, Arthritis, Asthma, Coronary artery disease, Dysrhythmia, GERD (gastroesophageal reflux disease), Headache, High cholesterol, History of DVT (deep vein thrombosis), Hypertension, Persistent atrial fibrillation ([place]), Sleep apnea, Type 2 diabetes mellitus without complication, without long-term current use of insulin ([place]) (02/16/2018), and Wolff-Parkinson-White (WPW) syndrome.   Outpatient Encounter Medications as of 08/01/2019  Medication Sig  . acetaminophen (TYLENOL) 500 MG tablet Take 1,000 mg by mouth every 6 (six) hours as needed for moderate pain or headache.   . albuterol (VENTOLIN HFA) 108 (90 Base) MCG/ACT inhaler Inhale 2 puffs into the lungs every 6 (six) hours as needed for wheezing or shortness of breath.  Marland Kitchen atorvastatin (LIPITOR) 10 MG tablet Take 1 tablet (10 mg total) by mouth daily.  Marland Kitchen azelastine (ASTELIN) 0.1 % nasal spray Place 2 sprays into both nostrils 2 (two) times daily.  . blood glucose meter kit and supplies Dispense based on patient and insurance preference. Use to check blood sugar once a day(FOR ICD-10 E10.9, E11.9).  . cephALEXin (KEFLEX) 500 MG capsule Take 1 capsule (500 mg total) by mouth 3 (three) times daily.  . chlorzoxazone (PARAFON) 500 MG tablet Take 1 tablet (500 mg total) by mouth 3 (three) times daily as  needed for muscle spasms.  . Cholecalciferol (VITAMIN D) 2000 UNITS CAPS Take 4,000 Units by mouth daily.   . cyanocobalamin 100 MCG tablet Take 100 mcg by mouth daily.  Marland Kitchen dofetilide (TIKOSYN) 500 MCG capsule TAKE 1 CAPSULE BY MOUTH TWO TIMES DAILY (Patient taking differently: Take 500 mcg by mouth 2 (two) times daily. )  . enalapril (VASOTEC) 10 MG tablet Take 1 tablet (10 mg total) by mouth 2 (two) times daily.  . famotidine (PEPCID) 20 MG tablet Take 1 tablet (20 mg total) by mouth at bedtime.  . Ferrous Sulfate (IRON) 325 (65 Fe) MG TABS Take 1 tablet (325 mg total) by mouth daily.  . fluconazole (DIFLUCAN) 150 MG tablet Take 1 tablet (150 mg total) by mouth once for 1 dose. May repeat in 3 days if needed.  . Fluticasone-Salmeterol (ADVAIR DISKUS) 250-50 MCG/DOSE AEPB Inhale 1 puff into the lungs every 12 (twelve) hours.  . furosemide (LASIX) 40 MG tablet Take 1 tablet (40 mg total) by mouth daily.  . hydrOXYzine (VISTARIL) 25 MG capsule Take 2 capsules (50 mg total) by mouth 3 (three) times daily as needed for anxiety.  Marland Kitchen ketoconazole (NIZORAL) 2 % cream Apply 1 application topically 2 (two) times daily. (Patient taking differently: Apply 1 application topically daily as needed for irritation. )  . levocetirizine (XYZAL) 5 MG tablet Take 1 tablet (5 mg total) by mouth every evening.  Marland Kitchen MAGNESIUM PO Take 500 mg by mouth daily.   . metFORMIN (GLUCOPHAGE)  1000 MG tablet Take 1 tablet (1,000 mg total) by mouth 2 (two) times daily with a meal.  . metoprolol succinate (TOPROL-XL) 50 MG 24 hr tablet Take 50 mg by mouth daily. Take with or immediately following a meal.  . nitroGLYCERIN (NITROSTAT) 0.4 MG SL tablet Place 1 tablet (0.4 mg total) under the tongue every 5 (five) minutes as needed for chest pain.  Marland Kitchen ondansetron (ZOFRAN ODT) 4 MG disintegrating tablet Take 1 tablet (4 mg total) by mouth every 8 (eight) hours as needed for nausea or vomiting.  Marland Kitchen oxybutynin (DITROPAN) 5 MG tablet TAKE  ONE-HALF TABLET (2.58m) BY  MOUTH TWO TIMES DAILY AS  NEEDED FOR BLADDER SPASMS (Patient taking differently: Take 2.5 mg by mouth 2 (two) times daily as needed for bladder spasms. TAKE ONE-HALF TABLET (2.570m BY  MOUTH TWO TIMES DAILY AS  NEEDED FOR BLADDER SPASMS)  . pantoprazole (PROTONIX) 40 MG tablet TAKE 1 TABLET (4072mBY MOUTH  DAILY  . potassium chloride SA (KLOR-CON) 20 MEQ tablet TAKE 1 TABLET (34m52mY MOUTH  EVERY DAY  . PROCTO-MED HC 2.5 % rectal cream APPLY 3 TIMES DAILY AS  DIRECTED (Patient taking differently: Place 1 application rectally 3 (three) times daily as needed for hemorrhoids. )  . tiZANidine (ZANAFLEX) 4 MG capsule Take one three times a day as needed for spasms (Patient taking differently: Take 4 mg by mouth 3 (three) times daily as needed for muscle spasms. )  . XARELTO 20 MG TABS tablet TAKE 1 TABLET BY MOUTH ONCE DAILY WITH SUPPER   No facility-administered encounter medications on file as of 08/01/2019.     Review of Systems  Constitutional: Negative for chills and fever.  Cardiovascular: Negative for chest pain.  Musculoskeletal: Positive for joint swelling (left elbow).  Skin: Positive for color change. Negative for rash and wound.  Neurological: Negative for weakness.     Vitals BP 128/82   Pulse 64   Temp (!) 97.3 F (36.3 C) (Oral)   Ht 6' 1"  (1.854 m)   Wt 270 lb (122.5 kg)   BMI 35.62 kg/m   Objective:   Physical Exam Constitutional:      General: He is not in acute distress.    Appearance: Normal appearance. He is not ill-appearing or toxic-appearing.  HENT:     Head: Normocephalic and atraumatic.  Musculoskeletal:        General: Swelling (left elbow) and tenderness (left elbow) present. No deformity or signs of injury.     Comments: NVI, nml cap refill.  Dec rom with flexion of elbow on left.  Normal rom of shoulder, hand, arm.   Skin:    General: Skin is warm and dry.     Findings: Erythema (left elbow) present. No lesion or  rash.  Neurological:     General: No focal deficit present.     Mental Status: He is alert and oriented to person, place, and time.     Cranial Nerves: No cranial nerve deficit.     Sensory: No sensory deficit.     Motor: No weakness.      Assessment and Plan   1. Olecranon bursitis of left elbow - fluconazole (DIFLUCAN) 150 MG tablet; Take 1 tablet (150 mg total) by mouth once for 1 dose. May repeat in 3 days if needed.  Dispense: 2 tablet; Refill: 0 - cephALEXin (KEFLEX) 500 MG capsule; Take 1 capsule (500 mg total) by mouth 3 (three) times daily.  Dispense: 21 capsule; Refill: 0  Ice pack, rest, cushion to left elbow.  Pt requesting work note off for rest of week. tylenol prn for pain. Since blood thinners interacting with nsaids.  Also unable to take certain antibiotics due to his tikosyn heart medication. Gave keflex tid for 7 days.  Call or rto if worsening pain, redness, or not improving after 2-3 days of antibiotics.  Pt in agreement.  F/u prn.

## 2019-08-01 NOTE — Patient Instructions (Signed)
Elbow Bursitis Bursitis is swelling and pain at the tip of your elbow. This happens when fluid builds up in a sac under your skin (bursa). This may also be called olecranon bursitis. Follow these instructions at home: Medicines  Take over-the-counter and prescription medicines only as told by your doctor.  If you were prescribed an antibiotic, take it exactly as told by your doctor. Do not stop taking it even if you start to feel better. Managing pain, stiffness, and swelling   If told, put ice on your elbow: ? Put ice in a plastic bag. ? Place a towel between your skin and the bag. ? Leave the ice on for 20 minutes, 2-3 times a day.  If your bursitis is caused by an injury, follow instructions from your doctor about: ? Resting your elbow. ? Wearing a bandage.  Wear elbow pads or elbow wraps as needed. These help cushion your elbow. General instructions  Avoid any activities that cause elbow pain. Ask your doctor what activities are safe for you.  Keep all follow-up visits as told by your doctor. This is important. Contact a doctor if you have:  A fever.  Problems that do not get better with treatment.  Pain or swelling that: ? Gets worse. ? Goes away and then comes back.  Pus draining from your elbow. Get help right away if you have:  Trouble moving your arm, hand, or fingers. Summary  Bursitis is swelling and pain at the tip of the elbow.  You may need to take medicine or put ice on your elbow.  Contact your doctor if your problems do not get better with treatment. This information is not intended to replace advice given to you by your health care provider. Make sure you discuss any questions you have with your health care provider. Document Revised: 03/05/2017 Document Reviewed: 03/02/2017 Elsevier Patient Education  2020 Elsevier Inc.  

## 2019-08-07 ENCOUNTER — Emergency Department (HOSPITAL_COMMUNITY): Payer: BC Managed Care – PPO

## 2019-08-07 ENCOUNTER — Other Ambulatory Visit: Payer: Self-pay

## 2019-08-07 ENCOUNTER — Telehealth: Payer: Self-pay | Admitting: *Deleted

## 2019-08-07 ENCOUNTER — Encounter (HOSPITAL_COMMUNITY): Payer: Self-pay | Admitting: *Deleted

## 2019-08-07 ENCOUNTER — Emergency Department (HOSPITAL_COMMUNITY)
Admission: EM | Admit: 2019-08-07 | Discharge: 2019-08-07 | Disposition: A | Discharge status: Home / Self Care | Payer: BC Managed Care – PPO | Attending: Emergency Medicine | Admitting: Emergency Medicine

## 2019-08-07 DIAGNOSIS — E119 Type 2 diabetes mellitus without complications: Secondary | ICD-10-CM | POA: Insufficient documentation

## 2019-08-07 DIAGNOSIS — Z87891 Personal history of nicotine dependence: Secondary | ICD-10-CM | POA: Diagnosis not present

## 2019-08-07 DIAGNOSIS — R0602 Shortness of breath: Secondary | ICD-10-CM | POA: Diagnosis not present

## 2019-08-07 DIAGNOSIS — U071 COVID-19: Secondary | ICD-10-CM | POA: Diagnosis not present

## 2019-08-07 DIAGNOSIS — I2510 Atherosclerotic heart disease of native coronary artery without angina pectoris: Secondary | ICD-10-CM | POA: Insufficient documentation

## 2019-08-07 DIAGNOSIS — Z79899 Other long term (current) drug therapy: Secondary | ICD-10-CM | POA: Insufficient documentation

## 2019-08-07 DIAGNOSIS — Z7901 Long term (current) use of anticoagulants: Secondary | ICD-10-CM | POA: Insufficient documentation

## 2019-08-07 DIAGNOSIS — I110 Hypertensive heart disease with heart failure: Secondary | ICD-10-CM | POA: Insufficient documentation

## 2019-08-07 DIAGNOSIS — I5022 Chronic systolic (congestive) heart failure: Secondary | ICD-10-CM | POA: Insufficient documentation

## 2019-08-07 DIAGNOSIS — R7989 Other specified abnormal findings of blood chemistry: Secondary | ICD-10-CM | POA: Diagnosis not present

## 2019-08-07 DIAGNOSIS — Z7984 Long term (current) use of oral hypoglycemic drugs: Secondary | ICD-10-CM | POA: Diagnosis not present

## 2019-08-07 LAB — CBC WITH DIFFERENTIAL/PLATELET
Abs Immature Granulocytes: 0.03 10*3/uL (ref 0.00–0.07)
Basophils Absolute: 0 10*3/uL (ref 0.0–0.1)
Basophils Relative: 1 %
Eosinophils Absolute: 0 10*3/uL (ref 0.0–0.5)
Eosinophils Relative: 0 %
HCT: 46.8 % (ref 39.0–52.0)
Hemoglobin: 14.4 g/dL (ref 13.0–17.0)
Immature Granulocytes: 0 %
Lymphocytes Relative: 12 %
Lymphs Abs: 1 10*3/uL (ref 0.7–4.0)
MCH: 26.8 pg (ref 26.0–34.0)
MCHC: 30.8 g/dL (ref 30.0–36.0)
MCV: 87 fL (ref 80.0–100.0)
Monocytes Absolute: 0.5 10*3/uL (ref 0.1–1.0)
Monocytes Relative: 6 %
Neutro Abs: 7 10*3/uL (ref 1.7–7.7)
Neutrophils Relative %: 81 %
Platelets: 285 10*3/uL (ref 150–400)
RBC: 5.38 MIL/uL (ref 4.22–5.81)
RDW: 17 % — ABNORMAL HIGH (ref 11.5–15.5)
WBC: 8.6 10*3/uL (ref 4.0–10.5)
nRBC: 0 % (ref 0.0–0.2)

## 2019-08-07 LAB — COMPREHENSIVE METABOLIC PANEL
ALT: 28 U/L (ref 0–44)
AST: 27 U/L (ref 15–41)
Albumin: 4 g/dL (ref 3.5–5.0)
Alkaline Phosphatase: 37 U/L — ABNORMAL LOW (ref 38–126)
Anion gap: 15 (ref 5–15)
BUN: 30 mg/dL — ABNORMAL HIGH (ref 6–20)
CO2: 20 mmol/L — ABNORMAL LOW (ref 22–32)
Calcium: 9.1 mg/dL (ref 8.9–10.3)
Chloride: 100 mmol/L (ref 98–111)
Creatinine, Ser: 1.85 mg/dL — ABNORMAL HIGH (ref 0.61–1.24)
GFR calc Af Amer: 46 mL/min — ABNORMAL LOW (ref >60)
GFR calc non Af Amer: 40 mL/min — ABNORMAL LOW (ref >60)
Glucose, Bld: 145 mg/dL — ABNORMAL HIGH (ref 70–99)
Potassium: 4.9 mmol/L (ref 3.5–5.1)
Sodium: 135 mmol/L (ref 135–145)
Total Bilirubin: 0.9 mg/dL (ref 0.3–1.2)
Total Protein: 8 g/dL (ref 6.5–8.1)

## 2019-08-07 LAB — POC SARS CORONAVIRUS 2 AG -  ED: SARS Coronavirus 2 Ag: POSITIVE — AB

## 2019-08-07 MED ORDER — METHYLPREDNISOLONE SODIUM SUCC 125 MG IJ SOLR
125.0000 mg | Freq: Once | INTRAMUSCULAR | Status: AC
  Administered 2019-08-07: 16:00:00 125 mg via INTRAVENOUS
  Filled 2019-08-07: qty 2

## 2019-08-07 MED ORDER — ONDANSETRON HCL 4 MG/2ML IJ SOLN
4.0000 mg | Freq: Once | INTRAMUSCULAR | Status: AC
  Administered 2019-08-07: 16:00:00 4 mg via INTRAVENOUS
  Filled 2019-08-07: qty 2

## 2019-08-07 MED ORDER — BENZONATATE 100 MG PO CAPS
100.0000 mg | ORAL_CAPSULE | Freq: Three times a day (TID) | ORAL | 0 refills | Status: DC | PRN
Start: 2019-08-07 — End: 2019-10-30

## 2019-08-07 MED ORDER — ONDANSETRON 4 MG PO TBDP
4.0000 mg | ORAL_TABLET | Freq: Three times a day (TID) | ORAL | 0 refills | Status: DC | PRN
Start: 2019-08-07 — End: 2020-01-29

## 2019-08-07 NOTE — ED Provider Notes (Signed)
[facility] EMERGENCY DEPARTMENT Provider Note   CSN: [id number] Arrival date & time: 08/07/19  1130     History Chief Complaint  Patient presents with  . Shortness of Breath    Austin Woods is a 54 y.o. male with history of A. fib anticoagulated on Xarelto, coronary artery disease, GERD, hyperlipidemia, hypertension, type 2 diabetes mellitus presents for evaluation of acute onset, persistent and worsening Covid-like symptoms for 4 days.  Reports he had a close exposure to COVID-19 by his son-in-law.  Friday he developed symptoms and went to [facility] where he bought an at home test which was positive for Covid.  Since then he has had subjective fevers and chills, temperatures around 99 F.  He reports dyspnea on exertion, chest pain which is substernal, worsens with cough and deep inspiration and palpation of the chest.  Denies leg swelling, recent travel or surgeries, hemoptysis.  Has had nausea and vomiting as well as diarrhea but denies melena or hematochezia.  He has been able to keep down some food and fluids at home.  He has been using his albuterol inhaler with some temporary relief and taking Tylenol as well.  He is a former smoker.  The history is provided by the patient.       Past Medical History:  Diagnosis Date  . A-fib ([place])   . Anemia    iron def anemia  . Arthritis   . Asthma   . Coronary artery disease   . Dysrhythmia    in and out Afib  . GERD (gastroesophageal reflux disease)   . Headache   . High cholesterol   . History of DVT (deep vein thrombosis)    a. left leg following ablation for SVT  . Hypertension   . Persistent atrial fibrillation ([place])    a. Dx 08/2012, xarelto initiated; b. 09/2012 Tikosyn initiated -> DCCV -> Sinus c. PVI 03/2013 d. PVI 03/2014  . Sleep apnea    USES  CPAP   . Type 2 diabetes mellitus without complication, without long-term current use of insulin ([place]) 02/16/2018  . Wolff-Parkinson-White (WPW) syndrome    a. s/p RFCA 2001 by  Dr Caryl Comes, pt reports complicated by post procedure DVT    Patient Active Problem List   Diagnosis Date Noted  . Iron deficiency anemia 03/21/2019  . History of colonic polyps 03/21/2019  . Upper abdominal pain 02/09/2019  . Nausea without vomiting 02/09/2019  . Abdominal bloating 02/09/2019  . Anemia 02/09/2019  . Type 2 diabetes mellitus without complication, without long-term current use of insulin ([place]) 02/16/2018  . 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 Chest pain in adult 07/10/2016  . Atypical chest pain 07/10/2016  . Pseudoaneurysm following procedure ([place]) 03/03/2016  . A-fib ([place]) 02/18/2016  . Chest pain 01/30/2016  . Persistent atrial fibrillation ([place])   . Unstable angina pectoris ([place])   . Asthma, chronic 08/26/2014  . Palpitations 06/28/2014  . Atrial fibrillation with RVR ([place]) 02/02/2014  . Acute on chronic systolic CHF (congestive heart failure) ([place]) 02/02/2014  . Obstructive sleep apnea 10/12/2012  . WPW (Wolff-Parkinson-White syndrome) 09/05/2012  . Hypokalemia 09/05/2012  . 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 Essential hypertension, benign 09/03/2012  . Fatigue 08/15/2012  . Morbid obesity ([place]) 08/15/2012  . Atrial fibrillation ([place]) 08/07/2012    Past Surgical History:  Procedure Laterality Date  . ATRIAL FIBRILLATION ABLATION  03/23/2013   PVI by DR Rayann Heman for afib  . ATRIAL FIBRILLATION ABLATION N/A 03/23/2013  Procedure: ATRIAL FIBRILLATION ABLATION;  Surgeon: Coralyn Mark, MD;  Location: [place] CATH LAB;  Service: Cardiovascular;  Laterality: N/A;  . ATRIAL FIBRILLATION ABLATION N/A 03/27/2014   PVI Dr Rayann Heman  . BACK SURGERY    . CARDIAC CATHETERIZATION N/A 02/03/2016   Procedure: Left Heart Cath and Coronary Angiography;  Surgeon: Nelva Bush, MD;  Location: [place] CV LAB;  Service: Cardiovascular;  Laterality: N/A;  . CARDIAC CATHETERIZATION N/A 02/03/2016   Procedure: Intravascular Pressure Wire/FFR Study;  Surgeon:  Nelva Bush, MD;  Location: [place] CV LAB;  Service: Cardiovascular;  Laterality: N/A;  . CARDIAC CATHETERIZATION N/A 02/03/2016   Procedure: Coronary Stent Intervention;  Surgeon: Nelva Bush, MD;  Location: [place] CV LAB;  Service: Cardiovascular;  Laterality: N/A;  . CARDIAC SURGERY    . CARDIOVERSION N/A 08/25/2012   Procedure: CARDIOVERSION;  Surgeon: Thayer Headings, MD;  Location: [facility] ENDOSCOPY;  Service: Cardiovascular;  Laterality: N/A;  . CARDIOVERSION N/A 09/06/2012   Procedure: CARDIOVERSION/Bedside;  Surgeon: Carlena Bjornstad, MD;  Location: [place];  Service: Cardiovascular;  Laterality: N/A;  . CARDIOVERSION N/A 01/12/2014   Procedure: CARDIOVERSION;  Surgeon: Pixie Casino, MD;  Location: [facility] ENDOSCOPY;  Service: Cardiovascular;  Laterality: N/A;  . CARDIOVERSION N/A 01/30/2016   Procedure: CARDIOVERSION;  Surgeon: Sueanne Margarita, MD;  Location[facility] ENDOSCOPY;  Service: Cardiovascular;  Laterality: N/A;  . CARDIOVERSION N/A 04/14/2016   Procedure: CARDIOVERSION;  Surgeon: Pixie Casino, MD;  Location: [facility] ENDOSCOPY;  Service: Cardiovascular;  Laterality: N/A;  . COLONOSCOPY N/A 12/04/2015   Procedure: COLONOSCOPY;  Surgeon: Rogene Houston, MD;  [facility];  Service: Endoscopy;  Laterality: N/A;  730  . COLONOSCOPY WITH PROPOFOL N/A 04/28/2019   Procedure: COLONOSCOPY WITH PROPOFOL;  Surgeon: Rogene Houston, MD;  [facility];  Service: Endoscopy;  Laterality: N/A;  . CORONARY ANGIOPLASTY    . CORONARY ANGIOPLASTY WITH STENT PLACEMENT    . ELECTROPHYSIOLOGIC STUDY N/A 02/18/2016   Procedure: Atrial Fibrillation Ablation;  Surgeon: Thompson Grayer, MD;  Location: [place] CV LAB;  Service: Cardiovascular;  Laterality: N/A;  . ESOPHAGOGASTRODUODENOSCOPY (EGD) WITH PROPOFOL N/A 04/28/2019   Procedure: ESOPHAGOGASTRODUODENOSCOPY (EGD) WITH PROPOFOL;  Surgeon: Rogene Houston, MD;  [facility];  Service: Endoscopy;  Laterality: N/A;  7:30   . implantable loop recorder removal  03/01/2019   MDT Reveal LINQ removed in office  . INGUINAL HERNIA REPAIR Bilateral 06/04/2017   Procedure: OPEN BILATERAL INGUINAL HERNIA REPAIR;  Surgeon: Judeth Horn, MD;  Location: [place];  Service: General;  Laterality: Bilateral;  . LOOP RECORDER IMPLANT N/A 06/29/2014   Procedure: LOOP RECORDER IMPLANT;  Surgeon: Thompson Grayer, MD;  Location: [facility] CATH LAB;  Service: Cardiovascular;  Laterality: N/A;  . NOSE SURGERY     sleep apnea surgery  . PERIPHERAL VASCULAR CATHETERIZATION N/A 03/13/2016   Procedure: Thrombin Injection;  Surgeon: Serafina Mitchell, MD;  Location: [place] CV LAB;  Service: Cardiovascular;  Laterality: N/A;  . POLYPECTOMY  04/28/2019   Procedure: POLYPECTOMY;  Surgeon: Rogene Houston, MD;  [facility];  Service: Endoscopy;;  duodenum;splenic flexure;  . STOMACH SURGERY     reflux, fundoplication  . TEE WITHOUT CARDIOVERSION N/A 08/25/2012   Procedure: TRANSESOPHAGEAL ECHOCARDIOGRAM (TEE);  Surgeon: Thayer Headings, MD;  Location: [place];  Service: Cardiovascular;  Laterality: N/A;  . TEE WITHOUT CARDIOVERSION  03/23/2013   DR ROSS  . TEE WITHOUT CARDIOVERSION N/A 03/23/2013  Procedure: TRANSESOPHAGEAL ECHOCARDIOGRAM (TEE);  Surgeon: Fay Records, MD;  Location: [facility] ENDOSCOPY;  Service: Cardiovascular;  Laterality: N/A;  . TEE WITHOUT CARDIOVERSION N/A 03/26/2014   Procedure: TRANSESOPHAGEAL ECHOCARDIOGRAM (TEE);  Surgeon: Josue Hector, MD;  Location: [facility] ENDOSCOPY;  Service: Cardiovascular;  Laterality: N/A;  . TONSILLECTOMY         Family History  Problem Relation Age of Onset  . CAD Father 36  . Diabetes Father   . Heart attack Father   . Transient ischemic attack Father   . Vascular Disease Father   . Diabetes Mother   . Anemia Mother   . Gout Mother   . Aneurysm Maternal Grandmother   . Stroke Maternal Grandfather   . Diabetes Paternal Grandmother   . Stroke Paternal Grandfather   . Diabetes  Brother     Social History   Tobacco Use  . Smoking status: Former Smoker    Packs/day: 1.00    Years: 7.00    Pack years: 7.00    Types: Cigarettes    Start date: 05/19/2003    Quit date: 08/06/2010    Years since quitting: 9.0  . Smokeless tobacco: Never Used  Substance Use Topics  . Alcohol use: No    Alcohol/week: 0.0 standard drinks  . Drug use: No    Home Medications Prior to Admission medications   Medication Sig Start Date End Date Taking? Authorizing Provider  acetaminophen (TYLENOL) 500 MG tablet Take 1,000 mg by mouth every 6 (six) hours as needed for moderate pain or headache.    Yes [provider]  albuterol (VENTOLIN HFA) 108 (90 Base) MCG/ACT inhaler Inhale 2 puffs into the lungs every 6 (six) hours as needed for wheezing or shortness of breath. 07/21/19  Yes Faustino Congress, NP  atorvastatin (LIPITOR) 10 MG tablet Take 1 tablet (10 mg total) by mouth daily. 05/11/19  Yes [name], MD  azelastine (ASTELIN) 0.1 % nasal spray Place 2 sprays into both nostrils 2 (two) times daily. 10/04/18  Yes [name], MD  blood glucose meter kit and supplies Dispense based on patient and insurance preference. Use to check blood sugar once a day(FOR ICD-10 E10.9, E11.9). 02/16/18  Yes [name], MD  cephALEXin (KEFLEX) 500 MG capsule Take 1 capsule (500 mg total) by mouth 3 (three) times daily. 08/01/19  Yes Lovena Le, Malena M, DO  chlorzoxazone (PARAFON) 500 MG tablet Take 1 tablet (500 mg total) by mouth 3 (three) times daily as needed for muscle spasms. 12/15/17  Yes [name], MD  Cholecalciferol (VITAMIN D) 2000 UNITS CAPS Take 4,000 Units by mouth daily.    Yes [provider]  cyanocobalamin 100 MCG tablet Take 100 mcg by mouth daily.   Yes [provider]  dofetilide (TIKOSYN) 500 MCG capsule TAKE 1 CAPSULE BY MOUTH TWO TIMES DAILY Patient taking differently: Take 500 mcg by mouth 2 (two) times daily.  01/16/19  Yes Allred,  Jeneen Rinks, MD  enalapril (VASOTEC) 10 MG tablet Take 1 tablet (10 mg total) by mouth 2 (two) times daily. 05/29/16  Yes Allred, Jeneen Rinks, MD  famotidine (PEPCID) 20 MG tablet Take 1 tablet (20 mg total) by mouth at bedtime. 04/14/19  Yes Minus Liberty, PA-C  Ferrous Sulfate (IRON) 325 (65 Fe) MG TABS Take 1 tablet (325 mg total) by mouth daily. 06/16/19  Yes Rehman, Mechele Dawley, MD  fluconazole (DIFLUCAN) 150 MG tablet Take 150 mg by mouth 2 (two) times daily as needed. 08/01/19  Yes [provider]  fluticasone (FLONASE) 50 MCG/ACT nasal spray Place 1 spray into both nostrils daily.   Yes [provider]  Fluticasone-Salmeterol (ADVAIR DISKUS) 250-50 MCG/DOSE AEPB Inhale 1 puff into the lungs every 12 (twelve) hours. 07/29/17  Yes [name], MD  furosemide (LASIX) 40 MG tablet Take 1 tablet (40 mg total) by mouth daily. Patient taking differently: Take 40 mg by mouth daily as needed.  05/11/19  Yes [name], MD  hydrOXYzine (VISTARIL) 25 MG capsule Take 2 capsules (50 mg total) by mouth 3 (three) times daily as needed for anxiety. 07/11/16  Yes Kathie Dike, MD  ketoconazole (NIZORAL) 2 % cream Apply 1 application topically 2 (two) times daily. Patient taking differently: Apply 1 application topically daily as needed for irritation.  12/24/17  Yes [name], MD  levocetirizine (XYZAL) 5 MG tablet Take 1 tablet (5 mg total) by mouth every evening. 07/21/19  Yes Faustino Congress, NP  MAGNESIUM PO Take 500 mg by mouth daily.    Yes [provider]  metFORMIN (GLUCOPHAGE) 1000 MG tablet Take 1 tablet (1,000 mg total) by mouth 2 (two) times daily with a meal. Patient taking differently: Take 500 mg by mouth 2 (two) times daily with a meal.  05/11/19  Yes [name], MD  metoprolol tartrate (LOPRESSOR) 50 MG tablet Take 50 mg by mouth daily. 06/03/19  Yes [provider]  nitroGLYCERIN (NITROSTAT) 0.4 MG SL tablet Place 1 tablet (0.4 mg total) under  the tongue every 5 (five) minutes as needed for chest pain. 07/25/19  Yes Herminio Commons, MD  oxybutynin (DITROPAN) 5 MG tablet TAKE ONE-HALF TABLET (2.28m) BY  MOUTH TWO TIMES DAILY AS  NEEDED FOR BLADDER SPASMS Patient taking differently: Take 2.5 mg by mouth 2 (two) times daily as needed for bladder spasms. TAKE ONE-HALF TABLET (2.587m BY  MOUTH TWO TIMES DAILY AS  NEEDED FOR BLADDER SPASMS 07/29/17  Yes LuMikey KirschnerMD  pantoprazole (PROTONIX) 40 MG tablet TAKE 1 TABLET (4077mBY MOUTH  DAILY Patient taking differently: Take 40 mg by mouth daily. TAKE 1 TABLET (79m81mY MOUTH  DAILY 05/11/19  Yes LukiMikey Kirschner  potassium chloride SA (KLOR-CON) 20 MEQ tablet TAKE 1 TABLET (20mg65m MOUTH  EVERY DAY Patient taking differently: Take 20 mEq by mouth daily. TAKE 1 TABLET (20mg)91mMOUTH  EVERY DAY 05/11/19  Yes LukingMikey KirschnerPROCTO-MED HC 2.5[facility] rectal cream APPLY 3 TIMES DAILY AS  DIRECTED Patient taking differently: Place 1 application rectally 3 (three) times daily as needed for hemorrhoids.  04/29/17  Yes LukingMikey KirschnertiZANidine (ZANAFLEX) 4 MG capsule Take one three times a day as needed for spasms Patient taking differently: Take 4 mg by mouth 3 (three) times daily as needed for muscle spasms.  07/01/17  Yes LukingMikey KirschnerXARELTO 20 MG TABS tablet TAKE 1 TABLET BY MOUTH ONCE DAILY WITH SUPPER Patient taking differently: Take 20 mg by mouth daily.  06/30/19  Yes Allred, Elishua,Jeneen Rinksbenzonatate (TESSALON) 100 MG capsule Take 1 capsule (100 mg total) by mouth 3 (three) times daily as needed for cough. 08/07/19   Ramina Hulet A, PA-C  ondansetron (ZOFRAN ODT) 4 MG disintegrating tablet Take 1 tablet (4 mg total) by mouth every 8 (eight) hours as needed for nausea or vomiting. 08/07/19   Aries Kasa,Renita Papa    Allergies    Patient has  no known allergies.  Review of Systems   Review of Systems  Constitutional: Positive for chills and fever.  Respiratory: Positive  for cough and shortness of breath.   Cardiovascular: Positive for chest pain. Negative for leg swelling.  Gastrointestinal: Positive for diarrhea, nausea and vomiting.  All other systems reviewed and are negative.   Physical Exam Updated Vital Signs BP 109/74 (BP Location: Right Arm)   Pulse 75   Temp 99.2 F (37.3 C) (Oral)   Resp 19   SpO2 95%   Physical Exam Vitals and nursing note reviewed.  Constitutional:      General: He is not in acute distress.    Appearance: He is well-developed. He is obese.  HENT:     Head: Normocephalic and atraumatic.  Eyes:     General:        Right eye: No discharge.        Left eye: No discharge.     Conjunctiva/sclera: Conjunctivae normal.  Neck:     Vascular: No JVD.     Trachea: No tracheal deviation.  Cardiovascular:     Rate and Rhythm: Normal rate and regular rhythm.  Pulmonary:     Effort: Pulmonary effort is normal.     Comments: Speaking in full sentences without difficulty, SPO2 saturations 93 to 95% on room air.  Breath sounds globally diminished but no wheezes or rales Chest:     Chest wall: Tenderness present.     Comments: Diffuse anterior chest wall tenderness to palpation with no deformity, crepitus, ecchymosis or flail segment. Abdominal:     General: There is no distension.     Palpations: Abdomen is soft.  Musculoskeletal:     Cervical back: Neck supple.  Skin:    General: Skin is warm and dry.     Findings: No erythema.  Neurological:     Mental Status: He is alert.  Psychiatric:        Behavior: Behavior normal.     ED Results / Procedures / Treatments   Labs (all labs ordered are listed, but only abnormal results are displayed) Labs Reviewed  CBC WITH DIFFERENTIAL/PLATELET - Abnormal; Notable for the following components:      Result Value   RDW 17.0 (*)    All other components within normal limits  COMPREHENSIVE METABOLIC PANEL - Abnormal; Notable for the following components:   CO2 20 (*)     Glucose, Bld 145 (*)    BUN 30 (*)    Creatinine, Ser 1.85 (*)    Alkaline Phosphatase 37 (*)    GFR calc non Af Amer 40 (*)    GFR calc Af Amer 46 (*)    All other components within normal limits  POC SARS CORONAVIRUS 2 AG -  ED - Abnormal; Notable for the following components:   SARS Coronavirus 2 Ag POSITIVE (*)    All other components within normal limits    EKG None  Radiology DG Chest Port 1 View  Result Date: 08/07/2019 CLINICAL DATA:  Short of breath and weakness for 3 days. EXAM: PORTABLE CHEST 1 VIEW COMPARISON:  06/02/2017 FINDINGS: Decreased lung volumes. Cardiac enlargement. No pleural effusion or edema. No airspace densities identified. Review of the visualized osseous structures is unremarkable. IMPRESSION: 1. No acute findings. 2. Low lung volumes and cardiac enlargement. Electronically Signed   By: Kerby Moors M.D.   On: 08/07/2019 12:48    Procedures Procedures (including critical care time)  Medications Ordered in ED Medications  ondansetron [facility]) injection 4 mg (4 mg Intravenous Given 08/07/19 1537)  methylPREDNISolone sodium succinate (SOLU-MEDROL) 125 mg/2 mL injection 125 mg (125 mg Intravenous Given 08/07/19 1538)    ED Course  I have reviewed the triage vital signs and the nursing notes.  Pertinent labs & imaging results that were available during my care of the patient were reviewed by me and considered in my medical decision making (see chart for details).    MDM Rules/Calculators/A&P                      Austin Woods was evaluated in Emergency Department on 08/07/2019 for the symptoms described in the history of present illness. He was evaluated in the context of the global COVID-19 pandemic, which necessitated consideration that the patient might be at risk for infection with the SARS-CoV-2 virus that causes COVID-19. Institutional protocols and algorithms that pertain to the evaluation of patients at risk for COVID-19 are in a state of rapid  change based on information released by regulatory bodies including the CDC and federal and state organizations. These policies and algorithms were followed during the patient's care in the ED.  Patient presenting for evaluation of Covid symptoms.  Reports that he tested positive at home after purchasing an at home Covid testing kit from [facility].  My assessment he is afebrile, vital signs are stable.  He is nontoxic in appearance.  Speaking in full sentences without difficulty with no evidence of respiratory distress.  He has chest wall pain reproducible on palpation.  EKG shows normal sinus rhythm with no acute ischemic abnormalities.  I have a low suspicion of ACS/MI, as his symptoms appear more consistent with pain due to persistent cough.  His abdomen is soft and nontender with no rebound or guarding.  Doubt acute surgical abdominal pathology.  Chest x-ray in the ED shows no acute findings, no concern for PE, cardiac tamponade, pneumothorax, esophageal rupture, CHF, dissection.  Lab work reviewed by me shows no leukocytosis, no anemia, no metabolic derangements.  He has mild elevation in his BUN and creatinine likely in the setting of mild dehydration.  However he is hemodynamically stable and has been tolerating p.o. food and fluids at home for the most part.  He has tolerated sips of fluids here.  He was ambulated in the ED with stable SPO2 saturations.  We will discharge him home with close outpatient follow-up and refer him to the [facility] health at home program for Covid positive patients.  Discussed symptomatic management.  Discussed indications for return to the ED.  He has a pulse oximeter at home and reports his oxygen saturations have been 95% or greater there.  Patient verbalized understanding of and agreement with plan and is stable for discharge at this time.   Final Clinical Impression(s) / ED Diagnoses Final diagnoses:  COVID-19  Elevated serum creatinine    Rx / DC Orders ED Discharge  Orders         Ordered    ondansetron (ZOFRAN ODT) 4 MG disintegrating tablet  Every 8 hours PRN     08/07/19 1624    benzonatate (TESSALON) 100 MG capsule  3 times daily PRN     08/07/19 1624           Renita Papa, PA-C 08/07/19 1650    Milton Ferguson, MD 08/08/19 [phone]

## 2019-08-07 NOTE — ED Triage Notes (Signed)
Pt c/o sob and weakness x 3 days; pt c/o cough with sometimes productive

## 2019-08-07 NOTE — Telephone Encounter (Signed)
Patient calls and states he took an at home Covid test and the results were positive and he has a bad cough and patient is experiencing SOB. Advised patient to go to the ER for evaluation and treatment. Patient verbalized understanding.

## 2019-08-07 NOTE — ED Notes (Signed)
Pt ambulated well. O2 sats stayed in between 93-94%.

## 2019-08-07 NOTE — ED Notes (Signed)
Can of ginger ale just given to pt.

## 2019-08-07 NOTE — Discharge Instructions (Signed)
Your Covid test today was positive.  Please continue to quarantine for at least 10 days since her symptoms began.  You should not return to work until after 10 days if your symptoms are improving and if you have been fever free for 24 hours without the use of ibuprofen or Tylenol. You can take 1 to 2 tablets of Tylenol every 6 hours as needed for fever aches or pains.  Do not exceed more than 4000 mg of Tylenol daily.  Do not take NSAIDs such as ibuprofen, Advil, Aleve, or Motrin because you are on Xarelto. You can take Tessalon as needed for cough.  Continue to use your albuterol inhaler 1 to 2 puffs every 4-6 hours as needed for shortness of breath. You can take Zofran as needed for nausea and vomiting.  Let this medicine dissolve under your tongue and wait around 10 or 15 minutes before you have anything to eat or drink to get this medicine time to work [place] of fluids and get rest. Please continue to check your oxygen saturations with the pulse oximeter.  Anything below 92% is abnormal and requires evaluation. Your kidney function was mildly elevated today.  This is likely due to mild dehydration.  Please follow-up with your PCP for reevaluation of your symptoms and to recheck your kidney function to ensure improvement. Return to the emergency department if any concerning signs or symptoms develop such as low oxygen levels, severe shortness of breath, chest pains, loss of consciousness, persistent vomiting.

## 2019-08-08 ENCOUNTER — Telehealth: Payer: Self-pay | Admitting: Unknown Physician Specialty

## 2019-08-08 ENCOUNTER — Ambulatory Visit (HOSPITAL_COMMUNITY)
Admission: RE | Admit: 2019-08-08 | Discharge: 2019-08-08 | Disposition: A | Discharge status: Home / Self Care | Payer: BC Managed Care – PPO | Source: Ambulatory Visit | Attending: Pulmonary Disease | Admitting: Pulmonary Disease

## 2019-08-08 ENCOUNTER — Encounter: Payer: Self-pay | Admitting: Unknown Physician Specialty

## 2019-08-08 ENCOUNTER — Other Ambulatory Visit: Payer: Self-pay | Admitting: Unknown Physician Specialty

## 2019-08-08 DIAGNOSIS — E119 Type 2 diabetes mellitus without complications: Secondary | ICD-10-CM

## 2019-08-08 DIAGNOSIS — I10 Essential (primary) hypertension: Secondary | ICD-10-CM

## 2019-08-08 DIAGNOSIS — U071 COVID-19: Secondary | ICD-10-CM

## 2019-08-08 MED ORDER — ALBUTEROL SULFATE HFA 108 (90 BASE) MCG/ACT IN AERS: 2.0000 | INHALATION_SPRAY | Freq: Once | RESPIRATORY_TRACT | Status: DC | PRN

## 2019-08-08 MED ORDER — EPINEPHRINE 0.3 MG/0.3ML IJ SOAJ: 0.3000 mg | Freq: Once | INTRAMUSCULAR | Status: DC | PRN

## 2019-08-08 MED ORDER — SODIUM CHLORIDE 0.9 % IV SOLN: INTRAVENOUS | Status: DC | PRN

## 2019-08-08 MED ORDER — FAMOTIDINE IN NACL 20-0.9 MG/50ML-% IV SOLN: 20.0000 mg | Freq: Once | INTRAVENOUS | Status: DC | PRN

## 2019-08-08 MED ORDER — METHYLPREDNISOLONE SODIUM SUCC 125 MG IJ SOLR: 125.0000 mg | Freq: Once | INTRAMUSCULAR | Status: DC | PRN

## 2019-08-08 MED ORDER — DIPHENHYDRAMINE HCL 50 MG/ML IJ SOLN: 50.0000 mg | Freq: Once | INTRAMUSCULAR | Status: DC | PRN

## 2019-08-08 MED ORDER — SODIUM CHLORIDE 0.9 % IV SOLN
Freq: Once | INTRAVENOUS | Status: AC
  Filled 2019-08-08: qty 700

## 2019-08-08 NOTE — Telephone Encounter (Signed)
Reach out to pt, schedule outdoors o v at four pm thur afternoon, make that last visit of the day, thx

## 2019-08-08 NOTE — Telephone Encounter (Signed)
See below

## 2019-08-08 NOTE — Telephone Encounter (Signed)
  I connected by phone with Austin Woods on 08/08/2019 at 8:33 AM to discuss the potential use of an new treatment for mild to moderate COVID-19 viral infection in non-hospitalized patients.  This patient is a 55 y.o. male that meets the FDA criteria for Emergency Use Authorization of bamlanivimab/etesevimab or casirivimab/imdevimab.  Has a (+) direct SARS-CoV-2 viral test result  Has mild or moderate COVID-19   Is ? 56 years of age and weighs ? 40 kg  Is NOT hospitalized due to COVID-19  Is NOT requiring oxygen therapy or requiring an increase in baseline oxygen flow rate due to COVID-19  Is within 10 days of symptom onset  Has at least one of the high risk factor(s) for progression to severe COVID-19 and/or hospitalization as defined in EUA.  Specific high risk criteria : Diabetes   I have spoken and communicated the following to the patient or parent/caregiver:  1. FDA has authorized the emergency use of bamlanivimab/etesevimab and casirivimab\imdevimab for the treatment of mild to moderate COVID-19 in adults and pediatric patients with positive results of direct SARS-CoV-2 viral testing who are 71 years of age and older weighing at least 40 kg, and who are at high risk for progressing to severe COVID-19 and/or hospitalization.  2. The significant known and potential risks and benefits of bamlanivimab/etesevimab and casirivimab\imdevimab, and the extent to which such potential risks and benefits are unknown.  3. Information on available alternative treatments and the risks and benefits of those alternatives, including clinical trials.  4. Patients treated with bamlanivimab/etesevimab and casirivimab\imdevimab should continue to self-isolate and use infection control measures (e.g., wear mask, isolate, social distance, avoid sharing personal items, clean and disinfect "high touch" surfaces, and frequent handwashing) according to CDC guidelines.   5. The patient or parent/caregiver  has the option to accept or refuse bamlanivimab/etesevimab or casirivimab\imdevimab .  After reviewing this information with the patient, The patient agreed to proceed with receiving the bamlanimivab infusion and will be provided a copy of the Fact sheet prior to receiving the infusion.[name] 08/08/2019 8:33 AM

## 2019-08-08 NOTE — Telephone Encounter (Signed)
Called to discuss with patient about Covid symptoms and the use of bamlanivimab, a monoclonal antibody infusion for those with mild to moderate Covid symptoms and at a high risk of hospitalization.  Pt is qualified for this infusion at the [facility] due to Diabetes   Message left to call back  

## 2019-08-08 NOTE — Discharge Instructions (Signed)

## 2019-08-08 NOTE — Progress Notes (Signed)
  Diagnosis: COVID-19  Physician:Dr Joya Gaskins  Procedure: [facility] Med: bamlanivimab\etesevimab infusion - Provided patient with bamlanimivab\etesevimab fact sheet for patients, parents and caregivers prior to infusion.  Complications: No immediate complications noted.  Discharge: Discharged home   [place], [place] 08/08/2019

## 2019-08-10 ENCOUNTER — Encounter: Payer: Self-pay | Admitting: Family Medicine

## 2019-08-10 ENCOUNTER — Ambulatory Visit (INDEPENDENT_AMBULATORY_CARE_PROVIDER_SITE_OTHER): Payer: BC Managed Care – PPO | Admitting: Family Medicine

## 2019-08-10 ENCOUNTER — Other Ambulatory Visit: Payer: Self-pay

## 2019-08-10 DIAGNOSIS — U071 COVID-19: Secondary | ICD-10-CM

## 2019-08-10 MED ORDER — HYDROCODONE-HOMATROPINE 5-1.5 MG/5ML PO SYRP: 5.0000 mL | ORAL_SOLUTION | Freq: Four times a day (QID) | ORAL | 0 refills | Status: DC | PRN

## 2019-08-10 NOTE — Progress Notes (Signed)
   Subjective:    Patient ID: Austin Woods, male    DOB: May 26, 1965, 54 y.o.   MRN: [id number]  HPI  Patient arrives for a follow up on COVID. Patient states the cough is killing him and he has spells where he cough so long and hard he loses his breath.  Patient states overall his energy level has improved somewhat.  Also is having fairly severe headaches and these have improved somewhat also.  In addition appetite is improved  Oxygen saturations continued to maintain in the low to mid 90s.  No chest pain.  Severe cough.  Patient seen outside  Review of Systems See above    Objective:   Physical Exam Alert active.  No acute distress no noticeable tachypnea.  Heart regular rate and rhythm.  Lungs no crackles.  No wheezes auscultated       Assessment & Plan:  Impression COVID-19 infection.  Chest x-ray several days ago revealed no obvious pneumonia.  Clinically feels better than at that time though notes ongoing severe cough.  Utilizing albuterol regularly and encouraged to do that.  Will add Hycodan for cough.  Tessalon not helping.  Warning signs discussed carefully including oxygen saturation parameters  Greater than 50% of this 30 minute face to face visit was spent in counseling and discussion and coordination of care regarding the above diagnosis/diagnosies

## 2019-08-15 ENCOUNTER — Telehealth: Payer: Self-pay | Admitting: Family Medicine

## 2019-08-15 NOTE — Telephone Encounter (Signed)
Patient is to return to work tomorrow and is asking to be extended till Monday.

## 2019-08-16 ENCOUNTER — Encounter: Payer: Self-pay | Admitting: Family Medicine

## 2019-08-16 NOTE — Telephone Encounter (Signed)
sure

## 2019-08-16 NOTE — Telephone Encounter (Signed)
Letter done by Danae Chen

## 2019-08-21 ENCOUNTER — Telehealth: Payer: Self-pay | Admitting: Family Medicine

## 2019-08-21 DIAGNOSIS — Z029 Encounter for administrative examinations, unspecified: Secondary | ICD-10-CM

## 2019-08-21 NOTE — Telephone Encounter (Signed)
Please advise. Thank you

## 2019-08-21 NOTE — Telephone Encounter (Signed)
Patient had FMLA faxed over to be completed in your box to review and sign. Please fill in highlighted area on page 4.

## 2019-10-13 ENCOUNTER — Other Ambulatory Visit: Payer: Self-pay | Admitting: Cardiovascular Disease

## 2019-10-26 ENCOUNTER — Encounter (INDEPENDENT_AMBULATORY_CARE_PROVIDER_SITE_OTHER): Payer: Self-pay | Admitting: *Deleted

## 2019-10-27 ENCOUNTER — Other Ambulatory Visit: Payer: Self-pay | Admitting: *Deleted

## 2019-10-28 MED ORDER — POTASSIUM CHLORIDE CRYS ER 20 MEQ PO TBCR: EXTENDED_RELEASE_TABLET | ORAL | 1 refills | Status: DC

## 2019-10-30 ENCOUNTER — Ambulatory Visit
Admission: EM | Admit: 2019-10-30 | Discharge: 2019-10-30 | Disposition: A | Discharge status: Home / Self Care | Payer: BC Managed Care – PPO | Attending: Family Medicine | Admitting: Family Medicine

## 2019-10-30 ENCOUNTER — Other Ambulatory Visit: Payer: Self-pay

## 2019-10-30 ENCOUNTER — Encounter: Payer: Self-pay | Admitting: Emergency Medicine

## 2019-10-30 DIAGNOSIS — J209 Acute bronchitis, unspecified: Secondary | ICD-10-CM

## 2019-10-30 DIAGNOSIS — J0110 Acute frontal sinusitis, unspecified: Secondary | ICD-10-CM

## 2019-10-30 MED ORDER — BENZONATATE 100 MG PO CAPS
100.0000 mg | ORAL_CAPSULE | Freq: Three times a day (TID) | ORAL | 0 refills | Status: DC
Start: 2019-10-30 — End: 2020-01-29

## 2019-10-30 MED ORDER — PREDNISONE 10 MG (21) PO TBPK: ORAL_TABLET | Freq: Every day | ORAL | 0 refills | Status: AC

## 2019-10-30 MED ORDER — AZITHROMYCIN 250 MG PO TABS
250.0000 mg | ORAL_TABLET | Freq: Every day | ORAL | 0 refills | Status: DC
Start: 2019-10-30 — End: 2019-11-07

## 2019-10-30 NOTE — ED Provider Notes (Signed)
[facility]   [id number] 10/30/19 Arrival Time: 0905   SUBJECTIVE:  Austin Woods is a 54 y.o. male who presents with complaint of nasal congestion, post-nasal drainage, and a persistent cough. Onset abrupt approximately 4 days ago. Reports that he has been having sinus pain and pressure for the last week. Overall fatigued. SOB: mild. Wheezing: moderate. OTC treatment: cough and cold with no relief. Social History   Tobacco Use  Smoking Status Former Smoker  . Packs/day: 1.00  . Years: 7.00  . Pack years: 7.00  . Types: Cigarettes  . Start date: 05/19/2003  . Quit date: 08/06/2010  . Years since quitting: 9.2  Smokeless Tobacco Never Used    ROS: As per HPI.   OBJECTIVE:  Vitals:   10/30/19 0915 10/30/19 0917  BP:  (!) 145/85  Pulse:  57  Resp:  19  Temp:  98.1 F (36.7 C)  TempSrc:  Oral  SpO2:  98%  Weight: (!) 268 lb 15.4 oz (122 kg)   Height: 6' 1"  (1.854 m)      General appearance: alert; no distress HEENT: nasal congestion; clear runny nose; throat irritation secondary to cough; frontal sinus tenderness Neck: supple with LAD Lungs: unclear to auscultation bilaterally Skin: warm and dry Psychological: alert and cooperative; normal mood and affect  Results for orders placed or performed during the hospital encounter of 08/07/19  CBC with Differential  Result Value Ref Range   WBC 8.6 4.0 - 10.5 K/uL   RBC 5.38 4.22 - 5.81 MIL/uL   Hemoglobin 14.4 13.0 - 17.0 g/dL   HCT 46.8 39 - 52 %   MCV 87.0 80.0 - 100.0 fL   MCH 26.8 26.0 - 34.0 pg   MCHC 30.8 30.0 - 36.0 g/dL   RDW 17.0 (H) 11.5 - 15.5 %   Platelets 285 150 - 400 K/uL   nRBC 0.0 0.0 - 0.2 %   Neutrophils Relative % 81 %   Neutro Abs 7.0 1.7 - 7.7 K/uL   Lymphocytes Relative 12 %   Lymphs Abs 1.0 0.7 - 4.0 K/uL   Monocytes Relative 6 %   Monocytes Absolute 0.5 0 - 1 K/uL   Eosinophils Relative 0 %   Eosinophils Absolute 0.0 0 - 0 K/uL   Basophils Relative 1 %   Basophils  Absolute 0.0 0 - 0 K/uL   Immature Granulocytes 0 %   Abs Immature Granulocytes 0.03 0.00 - 0.07 K/uL  Comprehensive metabolic panel  Result Value Ref Range   Sodium 135 135 - 145 mmol/L   Potassium 4.9 3.5 - 5.1 mmol/L   Chloride 100 98 - 111 mmol/L   CO2 20 (L) 22 - 32 mmol/L   Glucose, Bld 145 (H) 70 - 99 mg/dL   BUN 30 (H) 6 - 20 mg/dL   Creatinine, Ser 1.85 (H) 0.61 - 1.24 mg/dL   Calcium 9.1 8.9 - 10.3 mg/dL   Total Protein 8.0 6.5 - 8.1 g/dL   Albumin 4.0 3.5 - 5.0 g/dL   AST 27 15 - 41 U/L   ALT 28 0 - 44 U/L   Alkaline Phosphatase 37 (L) 38 - 126 U/L   Total Bilirubin 0.9 0.3 - 1.2 mg/dL   GFR calc non Af Amer 40 (L) >60 mL/min   GFR calc Af Amer 46 (L) >60 mL/min   Anion gap 15 5 - 15  POC SARS Coronavirus 2 Ag-ED - Nasal Swab (BD Veritor Kit)  Result Value Ref Range  SARS Coronavirus 2 Ag POSITIVE (A) NEGATIVE    Labs Reviewed - No data to display  No results found.  No Known Allergies  Past Medical History:  Diagnosis Date  . A-fib (Early)   . Anemia    iron def anemia  . Arthritis   . Asthma   . Coronary artery disease   . Dysrhythmia    in and out Afib  . GERD (gastroesophageal reflux disease)   . Headache   . High cholesterol   . History of DVT (deep vein thrombosis)    a. left leg following ablation for SVT  . Hypertension   . Persistent atrial fibrillation ([place])    a. Dx 08/2012, xarelto initiated; b. 09/2012 Tikosyn initiated -> DCCV -> Sinus c. PVI 03/2013 d. PVI 03/2014  . Sleep apnea    USES  CPAP   . Type 2 diabetes mellitus without complication, without long-term current use of insulin ([place]) 02/16/2018  . Wolff-Parkinson-White (WPW) syndrome    a. s/p RFCA 2001 by Dr Caryl Comes, pt reports complicated by post procedure DVT   Social History   Socioeconomic History  . Marital status: Married    Spouse name: Not on file  . Number of children: 2  . Years of education: Not on file  . Highest education level: Not on file  Occupational History      Employer: [employer]  Tobacco Use  . Smoking status: Former Smoker    Packs/day: 1.00    Years: 7.00    Pack years: 7.00    Types: Cigarettes    Start date: 05/19/2003    Quit date: 08/06/2010    Years since quitting: 9.2  . Smokeless tobacco: Never Used  Vaping Use  . Vaping Use: Never used  Substance and Sexual Activity  . Alcohol use: No    Alcohol/week: 0.0 standard drinks  . Drug use: No  . Sexual activity: Not on file  Other Topics Concern  . Not on file  Social History Narrative   Pt lives in [place] with wife.  Works at [employer] of [employer]:   . Difficulty of Paying Living Expenses:   Food Insecurity:   . Worried About Charity fundraiser in the Last Year:   . Arboriculturist in the Last Year:   Transportation Needs:   . Film/video editor (Medical):   Marland Kitchen Lack of Transportation (Non-Medical):   Physical Activity:   . Days of Exercise per Week:   . Minutes of Exercise per Session:   Stress:   . Feeling of Stress :   Social Connections:   . Frequency of Communication with Friends and Family:   . Frequency of Social Gatherings with Friends and Family:   . Attends Religious Services:   . Active Member of Clubs or Organizations:   . Attends Archivist Meetings:   Marland Kitchen Marital Status:   Intimate Partner Violence:   . Fear of Current or Ex-Partner:   . Emotionally Abused:   Marland Kitchen Physically Abused:   . Sexually Abused:    Family History  Problem Relation Age of Onset  . CAD Father 39  . Diabetes Father   . Heart attack Father   . Transient ischemic attack Father   . Vascular Disease Father   . Diabetes Mother   . Anemia Mother   . Gout Mother   . Aneurysm Maternal Grandmother   . Stroke Maternal Grandfather   .  Diabetes Paternal Grandmother   . Stroke Paternal Grandfather   . Diabetes Brother      ASSESSMENT & PLAN:  1. Acute bronchitis, unspecified organism   2. Upper respiratory tract  infection, unspecified type     Meds ordered this encounter  Medications  . azithromycin (ZITHROMAX) 250 MG tablet    Sig: Take 1 tablet (250 mg total) by mouth daily. Take first 2 tablets together, then 1 every day until finished.    Dispense:  6 tablet    Refill:  0    Order Specific Question:   Supervising Provider    Answer:   Chase Picket [id number]  . predniSONE (STERAPRED UNI-PAK 21 TAB) 10 MG (21) TBPK tablet    Sig: Take by mouth daily for 6 days. Take 6 tablets on day 1, 5 tablets on day 2, 4 tablets on day 3, 3 tablets on day 4, 2 tablets on day 5, 1 tablet on day 6    Dispense:  21 tablet    Refill:  0    Order Specific Question:   Supervising Provider    Answer:   Chase Picket [id number]  . benzonatate (TESSALON) 100 MG capsule    Sig: Take 1 capsule (100 mg total) by mouth every 8 (eight) hours.    Dispense:  21 capsule    Refill:  0    Order Specific Question:   Supervising Provider    Answer:   Chase Picket [id number]    Acute sinusitis Acute bronchitis Prescribed prednisone taper Prescribed tessalon perles  Prescribed azithromycin  OTC symptom care as needed. Will plan f/u with PCP or here as needed.  Reviewed expectations re: course of current medical issues. Questions answered. Outlined signs and symptoms indicating need for more acute intervention. Patient verbalized understanding. After Visit Summary given.           Faustino Congress, NP 10/30/19 [phone]

## 2019-10-30 NOTE — Discharge Instructions (Signed)
I have sent in a prednisone taper for you to take for 6 days. 6 tablets on day one, 5 tablets on day two, 4 tablets on day three, 3 tablets on day four, 2 tablets on day five, and 1 tablet on day six.  I have sent in azithromycin for you as well. Take 2 tablets today and one tablet daily for the next 4 days  Follow up with this office or with primary care if you are not improving  Follow up in the ER for high fever, trouble swallowing, trouble breathing, other concerning symptoms

## 2019-10-30 NOTE — ED Triage Notes (Signed)
Cough, sinus pain and congestion since Friday. Pt had covid in May

## 2019-11-01 ENCOUNTER — Telehealth: Payer: Self-pay | Admitting: Emergency Medicine

## 2019-11-01 MED ORDER — HYDROCODONE-HOMATROPINE 5-1.5 MG/5ML PO SYRP: 5.0000 mL | ORAL_SOLUTION | Freq: Four times a day (QID) | ORAL | 0 refills | Status: DC | PRN

## 2019-11-01 NOTE — Telephone Encounter (Signed)
Patient requesting "stronger" cough medication.  Hycodan printed in office and left at front desk.  Patient to pick up later today.    Patient also requests another work note extension.  Informed we can do that today, but would not be responsible FMLA paperwork associated with missed time off.

## 2019-11-07 ENCOUNTER — Other Ambulatory Visit: Payer: Self-pay

## 2019-11-07 ENCOUNTER — Ambulatory Visit (INDEPENDENT_AMBULATORY_CARE_PROVIDER_SITE_OTHER): Payer: BC Managed Care – PPO | Admitting: Family Medicine

## 2019-11-07 DIAGNOSIS — R05 Cough: Secondary | ICD-10-CM

## 2019-11-07 DIAGNOSIS — J019 Acute sinusitis, unspecified: Secondary | ICD-10-CM | POA: Diagnosis not present

## 2019-11-07 DIAGNOSIS — R059 Cough, unspecified: Secondary | ICD-10-CM

## 2019-11-07 MED ORDER — METFORMIN HCL 1000 MG PO TABS: 1000.0000 mg | ORAL_TABLET | Freq: Two times a day (BID) | ORAL | 0 refills | Status: DC

## 2019-11-07 MED ORDER — METHYLPREDNISOLONE ACETATE 40 MG/ML IJ SUSP
40.0000 mg | Freq: Once | INTRAMUSCULAR | Status: AC
  Administered 2019-11-07: 40 mg via INTRAMUSCULAR

## 2019-11-07 MED ORDER — AMOXICILLIN-POT CLAVULANATE 875-125 MG PO TABS
1.0000 | ORAL_TABLET | Freq: Two times a day (BID) | ORAL | 0 refills | Status: DC
Start: 2019-11-07 — End: 2020-01-29

## 2019-11-07 MED ORDER — PREDNISONE 20 MG PO TABS: ORAL_TABLET | ORAL | 0 refills | Status: DC

## 2019-11-07 NOTE — Progress Notes (Signed)
   Subjective:    Patient ID: Austin Woods, male    DOB: 07-10-1965, 54 y.o.   MRN: [id number]  HPI Patient comes in today with complaints of coughing, congestion and sinus pressure x 1.5 weeks.  Patient reports completion on a steroid and antibiotic a few days ago with no improvement.  Rib pain from coughing.   This gentleman is having significant sinus pressure pain discomfort denies high fever chills Review of Systems  please see above    Objective:   Physical Exam  Eardrums normal sinuses tender throat normal lungs bronchial irritation with tight cough moving air well O2 sat 94%  Covid test taken patient not in respiratory distress    Assessment & Plan:  Acute rhinosinusitis Bronchial inflammation Depo-Medrol shot 40 mg Prednisone taper sent in Augmentin 875 twice daily 10 days If progressive symptoms or troubles to call back Return to work next week If patient sends FMLA we will fill out for the days she missed when he was at the urgent care as well as this week

## 2019-11-08 LAB — NOVEL CORONAVIRUS, NAA: SARS-CoV-2, NAA: NOT DETECTED

## 2019-11-08 LAB — SPECIMEN STATUS REPORT

## 2019-11-08 LAB — SARS-COV-2, NAA 2 DAY TAT

## 2019-11-09 ENCOUNTER — Telehealth: Payer: Self-pay | Admitting: Family Medicine

## 2019-11-09 DIAGNOSIS — Z029 Encounter for administrative examinations, unspecified: Secondary | ICD-10-CM

## 2019-11-09 NOTE — Telephone Encounter (Signed)
Patient had FMLA  Faxed over to be completed . I filled in what I could  In your folder to be completed.

## 2019-11-12 NOTE — Telephone Encounter (Signed)
His FMLA was completed thank you

## 2019-11-13 NOTE — Telephone Encounter (Signed)
Faxed over forms this morning

## 2019-11-15 ENCOUNTER — Other Ambulatory Visit (INDEPENDENT_AMBULATORY_CARE_PROVIDER_SITE_OTHER): Payer: Self-pay | Admitting: Gastroenterology

## 2019-11-15 DIAGNOSIS — K219 Gastro-esophageal reflux disease without esophagitis: Secondary | ICD-10-CM

## 2019-11-15 DIAGNOSIS — R110 Nausea: Secondary | ICD-10-CM

## 2019-11-16 ENCOUNTER — Other Ambulatory Visit: Payer: Self-pay

## 2019-11-16 MED ORDER — RIVAROXABAN 20 MG PO TABS: ORAL_TABLET | ORAL | 1 refills | Status: DC

## 2019-11-21 ENCOUNTER — Other Ambulatory Visit: Payer: Self-pay | Admitting: Internal Medicine

## 2019-12-13 ENCOUNTER — Telehealth: Payer: Self-pay | Admitting: Family Medicine

## 2019-12-13 NOTE — Telephone Encounter (Signed)
[facility] requesting refill on Furosemide 40 mg. Take one tablet po daily. Pt last seen 11/07/19 for cough. Please advise. Thank you

## 2019-12-13 NOTE — Telephone Encounter (Signed)
May have 90-day needs follow-up with Dr. Lovena Le later this fall

## 2019-12-14 NOTE — Telephone Encounter (Signed)
Please schedule and then route back to nurses 

## 2019-12-15 MED ORDER — FUROSEMIDE 40 MG PO TABS: 40.0000 mg | ORAL_TABLET | Freq: Every day | ORAL | 0 refills | Status: DC

## 2019-12-15 NOTE — Addendum Note (Signed)
Addended by: Vicente Males on: 12/15/2019 10:50 AM   Modules accepted: Orders

## 2019-12-15 NOTE — Telephone Encounter (Signed)
Patient scheduled appointment for 10/25

## 2019-12-15 NOTE — Telephone Encounter (Signed)
Med sent to pharmacy.

## 2019-12-18 ENCOUNTER — Other Ambulatory Visit: Payer: Self-pay | Admitting: *Deleted

## 2019-12-18 MED ORDER — PANTOPRAZOLE SODIUM 40 MG PO TBEC: DELAYED_RELEASE_TABLET | ORAL | 0 refills | Status: DC

## 2019-12-18 MED ORDER — ATORVASTATIN CALCIUM 10 MG PO TABS: 10.0000 mg | ORAL_TABLET | Freq: Every day | ORAL | 0 refills | Status: DC

## 2019-12-19 ENCOUNTER — Other Ambulatory Visit: Payer: Self-pay | Admitting: Internal Medicine

## 2019-12-19 NOTE — Telephone Encounter (Signed)
Pt due to see Dr Rayann Heman back 02/2020.  Last labs were done 08/07/19:  SCr 1.85  Hgb 14.4  Hct 46.8  Plts 285  Weight 122kg  Age 54  Based on above findings Xarelto 20mg  daily dose is appropriate.  Refill approved.

## 2019-12-24 ENCOUNTER — Other Ambulatory Visit: Payer: Self-pay | Admitting: Internal Medicine

## 2020-01-03 ENCOUNTER — Other Ambulatory Visit (INDEPENDENT_AMBULATORY_CARE_PROVIDER_SITE_OTHER): Payer: Self-pay | Admitting: Gastroenterology

## 2020-01-03 DIAGNOSIS — K219 Gastro-esophageal reflux disease without esophagitis: Secondary | ICD-10-CM

## 2020-01-03 DIAGNOSIS — R110 Nausea: Secondary | ICD-10-CM

## 2020-01-03 NOTE — Telephone Encounter (Signed)
Please advise 

## 2020-01-29 ENCOUNTER — Ambulatory Visit (INDEPENDENT_AMBULATORY_CARE_PROVIDER_SITE_OTHER): Payer: BC Managed Care – PPO | Admitting: Family Medicine

## 2020-01-29 ENCOUNTER — Other Ambulatory Visit: Payer: Self-pay

## 2020-01-29 ENCOUNTER — Encounter: Payer: Self-pay | Admitting: Family Medicine

## 2020-01-29 VITALS — BP 132/74 | HR 54 | Temp 97.9°F | Ht 73.0 in | Wt 257.0 lb

## 2020-01-29 DIAGNOSIS — Z23 Encounter for immunization: Secondary | ICD-10-CM

## 2020-01-29 DIAGNOSIS — D508 Other iron deficiency anemias: Secondary | ICD-10-CM

## 2020-01-29 DIAGNOSIS — K219 Gastro-esophageal reflux disease without esophagitis: Secondary | ICD-10-CM

## 2020-01-29 DIAGNOSIS — E119 Type 2 diabetes mellitus without complications: Secondary | ICD-10-CM | POA: Diagnosis not present

## 2020-01-29 DIAGNOSIS — I200 Unstable angina: Secondary | ICD-10-CM | POA: Diagnosis not present

## 2020-01-29 LAB — POCT GLYCOSYLATED HEMOGLOBIN (HGB A1C): Hemoglobin A1C: 5.7 % — AB (ref 4.0–5.6)

## 2020-01-29 MED ORDER — METFORMIN HCL 1000 MG PO TABS: 1000.0000 mg | ORAL_TABLET | Freq: Two times a day (BID) | ORAL | 1 refills | Status: DC

## 2020-01-29 MED ORDER — PANTOPRAZOLE SODIUM 40 MG PO TBEC: DELAYED_RELEASE_TABLET | ORAL | 1 refills | Status: DC

## 2020-01-29 NOTE — Progress Notes (Signed)
Patient ID: Austin Woods, male    DOB: Apr 24, 1965, 54 y.o.   MRN: [id number]   Chief Complaint  Patient presents with  . med check up  . Hypertension  . Diabetes   Subjective:    HPI med check up on DM and HTN.  Pt would like to get flu vaccine.   Dm2- doing well with medications.  a1c 5.7 today in office. Has been getting labs from his work clinic.  Didn't bring them today. Not having SEs with metformin.  Needing repeat colonoscopy and egd to f/u on anemia, hasn't done this year.  H/o Afib- doing well. Still taking xarelto.  Not having any chest pain, sob, or palpitations.  Iron def anemia-  Has h/o anemia and taking iron. Has h/o GI bleed in past. Seeing GI in [place].  Has h/o nissen fundoplication Has h/o edg and colonscopy in 1/21. Feeling dec energy.   Has seen black stools, but pt is taking iron tablets.  Results for orders placed or performed in visit on 01/29/20  POCT glycosylated hemoglobin (Hb A1C)  Result Value Ref Range   Hemoglobin A1C 5.7 (A) 4.0 - 5.6 %   HbA1c POC (<> result, manual entry)     HbA1c, POC (prediabetic range)     HbA1c, POC (controlled diabetic range)      Medical History Austin Woods has a past medical history of A-fib ([place]), Anemia, Arthritis, Asthma, Coronary artery disease, Dysrhythmia, GERD (gastroesophageal reflux disease), Headache, High cholesterol, History of DVT (deep vein thrombosis), Hypertension, Persistent atrial fibrillation ([place]), Sleep apnea, Type 2 diabetes mellitus without complication, without long-term current use of insulin ([place]) (02/16/2018), and Wolff-Parkinson-White (WPW) syndrome.   Outpatient Encounter Medications as of 01/29/2020  Medication Sig  . albuterol (VENTOLIN HFA) 108 (90 Base) MCG/ACT inhaler Inhale 2 puffs into the lungs every 6 (six) hours as needed for wheezing or shortness of breath.  Marland Kitchen aspirin EC 81 MG tablet Take 81 mg by mouth daily. Swallow whole.  Marland Kitchen atorvastatin (LIPITOR) 10 MG tablet  Take 1 tablet (10 mg total) by mouth daily.  Marland Kitchen azelastine (ASTELIN) 0.1 % nasal spray Place 2 sprays into both nostrils 2 (two) times daily.  . blood glucose meter kit and supplies Dispense based on patient and insurance preference. Use to check blood sugar once a day(FOR ICD-10 E10.9, E11.9).  Marland Kitchen Cholecalciferol (VITAMIN D) 2000 UNITS CAPS Take 4,000 Units by mouth daily.   Marland Kitchen dofetilide (TIKOSYN) 500 MCG capsule TAKE 1 CAPSULE BY MOUTH  TWICE DAILY  . enalapril (VASOTEC) 10 MG tablet Take 1 tablet (10 mg total) by mouth 2 (two) times daily.  . famotidine (PEPCID) 20 MG tablet TAKE 1 TABLET BY MOUTH AT BEDTIME  . Ferrous Sulfate (IRON) 325 (65 Fe) MG TABS Take 1 tablet (325 mg total) by mouth daily.  . fluticasone (FLONASE) 50 MCG/ACT nasal spray Place 1 spray into both nostrils daily.  . Fluticasone-Salmeterol (ADVAIR DISKUS) 250-50 MCG/DOSE AEPB Inhale 1 puff into the lungs every 12 (twelve) hours.  . furosemide (LASIX) 40 MG tablet Take 1 tablet (40 mg total) by mouth daily.  Marland Kitchen loratadine (CLARITIN) 10 MG tablet Take 10 mg by mouth daily.  Marland Kitchen MAGNESIUM PO Take 500 mg by mouth daily.   . metFORMIN (GLUCOPHAGE) 1000 MG tablet Take 1 tablet (1,000 mg total) by mouth 2 (two) times daily with a meal.  . metoprolol tartrate (LOPRESSOR) 50 MG tablet Take 50 mg by mouth daily.  . nitroGLYCERIN (NITROSTAT) 0.4 MG SL  tablet DISSOLVE 1 TABLET UNDER  TONGUE EVERY 5 MINUTES AS  NEEDED FOR CHEST PAIN MAX  OF 3 TABLETS IN 15 MINUTES. CALL 911 IF PAIN PERSISTS  . oxybutynin (DITROPAN) 5 MG tablet TAKE ONE-HALF TABLET (2.45m) BY  MOUTH TWO TIMES DAILY AS  NEEDED FOR BLADDER SPASMS (Patient taking differently: Take 2.5 mg by mouth 2 (two) times daily as needed for bladder spasms. TAKE ONE-HALF TABLET (2.581m BY  MOUTH TWO TIMES DAILY AS  NEEDED FOR BLADDER SPASMS)  . pantoprazole (PROTONIX) 40 MG tablet TAKE 1 TABLET (4082mBY MOUTH  DAILY  . potassium chloride SA (KLOR-CON) 20 MEQ tablet TAKE 1 TABLET (70m108mY  MOUTH  EVERY DAY  . vitamin B-12 (CYANOCOBALAMIN) 500 MCG tablet Take 500 mcg by mouth daily.  . XAAlveda ReasonsMG TABS tablet TAKE 1 TABLET BY MOUTH ONCE DAILY WITH SUPPER  . [DISCONTINUED] cyanocobalamin 100 MCG tablet Take 100 mcg by mouth daily.  . [DISCONTINUED] metFORMIN (GLUCOPHAGE) 1000 MG tablet Take 1 tablet (1,000 mg total) by mouth 2 (two) times daily with a meal.  . [DISCONTINUED] pantoprazole (PROTONIX) 40 MG tablet TAKE 1 TABLET (40mg48m MOUTH  DAILY  . hydrOXYzine (VISTARIL) 25 MG capsule Take 2 capsules (50 mg total) by mouth 3 (three) times daily as needed for anxiety. (Patient not taking: Reported on 01/29/2020)  . [DISCONTINUED] acetaminophen (TYLENOL) 500 MG tablet Take 1,000 mg by mouth every 6 (six) hours as needed for moderate pain or headache.   . [DISCONTINUED] amoxicillin-clavulanate (AUGMENTIN) 875-125 MG tablet Take 1 tablet by mouth 2 (two) times daily.  . [DISCONTINUED] benzonatate (TESSALON) 100 MG capsule Take 1 capsule (100 mg total) by mouth every 8 (eight) hours.  . [DISCONTINUED] chlorzoxazone (PARAFON) 500 MG tablet Take 1 tablet (500 mg total) by mouth 3 (three) times daily as needed for muscle spasms.  . [DISCONTINUED] HYDROcodone-homatropine (HYCODAN) 5-1.5 MG/5ML syrup Take 5 mLs by mouth every 6 (six) hours as needed for cough.  . [DISCONTINUED] HYDROcodone-homatropine (HYCODAN) 5-1.5 MG/5ML syrup Take 5 mLs by mouth every 6 (six) hours as needed for cough.  . [DISCONTINUED] ketoconazole (NIZORAL) 2 % cream Apply 1 application topically 2 (two) times daily. (Patient taking differently: Apply 1 application topically daily as needed for irritation. )  . [DISCONTINUED] levocetirizine (XYZAL) 5 MG tablet Take 1 tablet (5 mg total) by mouth every evening.  . [DISCONTINUED] ondansetron (ZOFRAN ODT) 4 MG disintegrating tablet Take 1 tablet (4 mg total) by mouth every 8 (eight) hours as needed for nausea or vomiting.  . [DISCONTINUED] predniSONE (DELTASONE) 20 MG  tablet 3qd for 3d then 2qd for 3d then 1qd for 3d  . [DISCONTINUED] PROCTO-MED HC 2.5 % rectal cream APPLY 3 TIMES DAILY AS  DIRECTED (Patient taking differently: Place 1 application rectally 3 (three) times daily as needed for hemorrhoids. )  . [DISCONTINUED] tiZANidine (ZANAFLEX) 4 MG capsule Take one three times a day as needed for spasms (Patient taking differently: Take 4 mg by mouth 3 (three) times daily as needed for muscle spasms. )   No facility-administered encounter medications on file as of 01/29/2020.     Review of Systems  Constitutional: Positive for fatigue. Negative for chills and fever.  HENT: Negative for congestion, rhinorrhea and sore throat.   Respiratory: Negative for cough, shortness of breath and wheezing.   Cardiovascular: Negative for chest pain and leg swelling.  Gastrointestinal: Negative for abdominal pain, diarrhea, nausea and vomiting.  Genitourinary: Negative for dysuria and frequency.  Skin: Negative  for rash.  Neurological: Negative for dizziness, weakness and headaches.     Vitals BP 132/74   Pulse (!) 54   Temp 97.9 F (36.6 C)   Ht 6' 1"  (1.854 m)   Wt 257 lb (116.6 kg)   SpO2 99%   BMI 33.91 kg/m   Objective:   Physical Exam Vitals and nursing note reviewed.  Constitutional:      General: He is not in acute distress.    Appearance: Normal appearance. He is not ill-appearing.  HENT:     Head: Normocephalic.     Nose: Nose normal. No congestion.     Mouth/Throat:     Mouth: Mucous membranes are moist.     Pharynx: No oropharyngeal exudate.  Eyes:     Extraocular Movements: Extraocular movements intact.     Conjunctiva/sclera: Conjunctivae normal.     Pupils: Pupils are equal, round, and reactive to light.  Cardiovascular:     Rate and Rhythm: Regular rhythm. Bradycardia present.     Pulses: Normal pulses.     Heart sounds: Normal heart sounds. No murmur heard.   Pulmonary:     Effort: Pulmonary effort is normal.     Breath  sounds: Normal breath sounds. No wheezing, rhonchi or rales.  Musculoskeletal:        General: Normal range of motion.     Right lower leg: No edema.     Left lower leg: No edema.  Skin:    General: Skin is warm and dry.     Coloration: Skin is pale.     Findings: Bruising (bilateral forearms) present. No rash.  Neurological:     General: No focal deficit present.     Mental Status: He is alert and oriented to person, place, and time.     Cranial Nerves: No cranial nerve deficit.  Psychiatric:        Mood and Affect: Mood normal.        Behavior: Behavior normal.        Thought Content: Thought content normal.        Judgment: Judgment normal.      Assessment and Plan   1. Diabetes mellitus without complication ([place]) - POCT glycosylated hemoglobin (Hb A1C) - CMP14+EGFR - Lipid panel; Future - metFORMIN (GLUCOPHAGE) 1000 MG tablet; Take 1 tablet (1,000 mg total) by mouth 2 (two) times daily with a meal.  Dispense: 180 tablet; Refill: 1 - Ambulatory referral to Ophthalmology  2. Other iron deficiency anemia - Ambulatory referral to Gastroenterology - CBC - Ferritin - Iron Binding Cap (TIBC)([facility]) - Iron  3. Unstable angina pectoris (HCC) - Lipid panel; Future  4. Need for vaccination - Flu Vaccine QUAD 6+ mos PF IM (Fluarix Quad PF)  5. Gastroesophageal reflux disease, unspecified whether esophagitis present - pantoprazole (PROTONIX) 40 MG tablet; TAKE 1 TABLET (38m) BY MOUTH  DAILY  Dispense: 90 tablet; Refill: 1    DM2- on Metformin- stable.  Cont meds.  htn- stable. Cont meds.  Iron def anemia- worsening. have pt rechck labs today.  Sent for referral back to gi for repeat egd and colonoscopy.  Cont iron tab daily.  afib- stable. Cont meds. Pt on xarelto and rechecking Hb today to see if needing any changes.  gerd- cont meds, protonix.  F/u 324mo

## 2020-01-30 LAB — CMP14+EGFR
ALT: 18 IU/L (ref 0–44)
AST: 16 IU/L (ref 0–40)
Albumin/Globulin Ratio: 2.2 (ref 1.2–2.2)
Albumin: 4.4 g/dL (ref 3.8–4.9)
Alkaline Phosphatase: 49 IU/L (ref 44–121)
BUN/Creatinine Ratio: 15 (ref 9–20)
BUN: 13 mg/dL (ref 6–24)
Bilirubin Total: 0.6 mg/dL (ref 0.0–1.2)
CO2: 23 mmol/L (ref 20–29)
Calcium: 9.6 mg/dL (ref 8.7–10.2)
Chloride: 105 mmol/L (ref 96–106)
Creatinine, Ser: 0.89 mg/dL (ref 0.76–1.27)
GFR calc Af Amer: 110 mL/min/{1.73_m2} (ref >59)
GFR calc non Af Amer: 96 mL/min/{1.73_m2} (ref >59)
Globulin, Total: 2 g/dL (ref 1.5–4.5)
Glucose: 113 mg/dL — ABNORMAL HIGH (ref 65–99)
Potassium: 4.9 mmol/L (ref 3.5–5.2)
Sodium: 143 mmol/L (ref 134–144)
Total Protein: 6.4 g/dL (ref 6.0–8.5)

## 2020-01-30 LAB — CBC
Hematocrit: 42.1 % (ref 37.5–51.0)
Hemoglobin: 13.5 g/dL (ref 13.0–17.7)
MCH: 28.4 pg (ref 26.6–33.0)
MCHC: 32.1 g/dL (ref 31.5–35.7)
MCV: 88 fL (ref 79–97)
Platelets: 357 10*3/uL (ref 150–450)
RBC: 4.76 x10E6/uL (ref 4.14–5.80)
RDW: 14 % (ref 11.6–15.4)
WBC: 7.6 10*3/uL (ref 3.4–10.8)

## 2020-01-30 LAB — FERRITIN: Ferritin: 12 ng/mL — ABNORMAL LOW (ref 30–400)

## 2020-01-30 LAB — IRON AND TIBC
Iron Saturation: 25 % (ref 15–55)
Iron: 115 ug/dL (ref 38–169)
Total Iron Binding Capacity: 462 ug/dL — ABNORMAL HIGH (ref 250–450)
UIBC: 347 ug/dL — ABNORMAL HIGH (ref 111–343)

## 2020-02-05 ENCOUNTER — Telehealth (INDEPENDENT_AMBULATORY_CARE_PROVIDER_SITE_OTHER): Payer: Self-pay | Admitting: *Deleted

## 2020-02-05 ENCOUNTER — Other Ambulatory Visit: Payer: Self-pay

## 2020-02-05 ENCOUNTER — Encounter (INDEPENDENT_AMBULATORY_CARE_PROVIDER_SITE_OTHER): Payer: Self-pay | Admitting: Gastroenterology

## 2020-02-05 ENCOUNTER — Ambulatory Visit (INDEPENDENT_AMBULATORY_CARE_PROVIDER_SITE_OTHER): Payer: BC Managed Care – PPO | Admitting: Gastroenterology

## 2020-02-05 ENCOUNTER — Other Ambulatory Visit (INDEPENDENT_AMBULATORY_CARE_PROVIDER_SITE_OTHER): Payer: Self-pay | Admitting: *Deleted

## 2020-02-05 ENCOUNTER — Other Ambulatory Visit (INDEPENDENT_AMBULATORY_CARE_PROVIDER_SITE_OTHER): Payer: Self-pay | Admitting: Gastroenterology

## 2020-02-05 ENCOUNTER — Encounter (INDEPENDENT_AMBULATORY_CARE_PROVIDER_SITE_OTHER): Payer: Self-pay | Admitting: *Deleted

## 2020-02-05 VITALS — BP 119/69 | HR 78 | Temp 98.6°F | Ht 73.0 in | Wt 260.6 lb

## 2020-02-05 DIAGNOSIS — K219 Gastro-esophageal reflux disease without esophagitis: Secondary | ICD-10-CM | POA: Diagnosis not present

## 2020-02-05 DIAGNOSIS — R110 Nausea: Secondary | ICD-10-CM

## 2020-02-05 DIAGNOSIS — Z7901 Long term (current) use of anticoagulants: Secondary | ICD-10-CM | POA: Diagnosis not present

## 2020-02-05 MED ORDER — PANTOPRAZOLE SODIUM 40 MG PO TBEC: 40.0000 mg | DELAYED_RELEASE_TABLET | Freq: Every day | ORAL | 3 refills | Status: DC

## 2020-02-05 NOTE — Progress Notes (Signed)
Patient profile: Austin Woods is a 54 y.o. male seen for evaluation of GERD. Last seen 02/2019 in office.   History of Present Illness: Austin Woods is seen today for follow up. He had an endoscopy and colonoscopy January 2021.   Reports chronic GERD over past few years but worse recently. He is currently on protonix 42m once a day and PRN pepcid qHS. GERD bothers him most at night. Tried sleeping on 2 pillows without improvement. Eats dinner at 8pm and lays flat around 10pm. No frequent nausea/vomiting.    Reports intermittent issues w/ loose stools and urgency after certain foods (last episode was after steak). Bowels alternate w/ formed stools. Denies lower abd pain but feels stomach stays tight but denies early satiety.   Reports exertional fatigue from low iron.    Non smoker. No frequent alcohol frequently. No nsaids.     Wt Readings from Last 3 Encounters:  02/05/20 260 lb 9.6 oz (118.2 kg)  01/29/20 257 lb (116.6 kg)  10/30/19 (!) 268 lb 15.4 oz (122 kg)     Last Colonoscopy: 04/2019-- Perianal skin tags found on perianal exam. - The examined portion of the ileum was normal. - Two diminutive polyps at the splenic flexure and in the ascending colon. Biopsied. - Diverticulosis at the hepatic flexure. - External hemorrhoids.   Last Endoscopy: 04/2019-- Normal esophagus. - Z-line regular, 41 cm from the incisors. - 3 cm hiatal hernia. - A Nissen fundoplication was found. The wrap appears loose. - A few gastric polyps. - Normal duodenal bulb. - A single duodenal polyp. Resected and retrieved. Injected. Clips (MR conditional) were placed.  SURGICAL PATHOLOGY  CASE: [id number]  PATIENT: JNovella Olive Surgical Pathology Report      Clinical History: iron deficiency anemia, history of colon polyps      FINAL MICROSCOPIC DIAGNOSIS:   A. DUODENUM, POLYPECTOMY:  - Duodenal adenoma (low-grade dysplasia)  - Negative for high-grade dysplasia or carcinoma     B. COLON, SPLENIC FLEXURE, ASCENDING, POLYPECTOMY:  - Tubular adenoma without high-grade dysplasia or malignancy  - Other fragment of polypoid colonic mucosa with no specific  histopathologic changes   Next EGD in 6 months and next colonoscopy in 7 years   Past Medical History:  Past Medical History:  Diagnosis Date  . A-fib ([place]   . Anemia    iron def anemia  . Arthritis   . Asthma   . Coronary artery disease   . Dysrhythmia    in and out Afib  . GERD (gastroesophageal reflux disease)   . Headache   . High cholesterol   . History of DVT (deep vein thrombosis)    a. left leg following ablation for SVT  . Hypertension   . Persistent atrial fibrillation ([place]    a. Dx 08/2012, xarelto initiated; b. 09/2012 Tikosyn initiated -> DCCV -> Sinus c. PVI 03/2013 d. PVI 03/2014  . Sleep apnea    USES  CPAP   . Type 2 diabetes mellitus without complication, without long-term current use of insulin ([place] 02/16/2018  . Wolff-Parkinson-White (WPW) syndrome    a. s/p RFCA 2001 by Dr KCaryl Comes pt reports complicated by post procedure DVT    Problem List: Patient Active Problem List   Diagnosis Date Noted  . Iron deficiency anemia 03/21/2019  . History of colonic polyps 03/21/2019  . Upper abdominal pain 02/09/2019  . Nausea without vomiting 02/09/2019  . Abdominal bloating 02/09/2019  . Anemia 02/09/2019  . Type 2  diabetes mellitus without complication, without long-term current use of insulin ([place]) 02/16/2018  . 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 Chest pain in adult 07/10/2016  . Atypical chest pain 07/10/2016  . Pseudoaneurysm following procedure ([place]) 03/03/2016  . A-fib ([place]) 02/18/2016  . Chest pain 01/30/2016  . Persistent atrial fibrillation ([place])   . Unstable angina pectoris ([place])   . Asthma, chronic 08/26/2014  . Palpitations 06/28/2014  . Atrial fibrillation with RVR ([place]) 02/02/2014  . Acute on chronic systolic CHF (congestive heart failure) ([place]) 02/02/2014   . Obstructive sleep apnea 10/12/2012  . WPW (Wolff-Parkinson-White syndrome) 09/05/2012  . Hypokalemia 09/05/2012  . Secondary cardiomyopathy ([place]) 09/03/2012  . Essential hypertension, benign 09/03/2012  . Fatigue 08/15/2012  . Morbid obesity ([place]) 08/15/2012  . Atrial fibrillation ([place]) 08/07/2012    Past Surgical History: Past Surgical History:  Procedure Laterality Date  . ATRIAL FIBRILLATION ABLATION  03/23/2013   PVI by DR Rayann Heman for afib  . ATRIAL FIBRILLATION ABLATION N/A 03/23/2013   Procedure: ATRIAL FIBRILLATION ABLATION;  Surgeon: Coralyn Mark, MD;  Location: [place] CATH LAB;  Service: Cardiovascular;  Laterality: N/A;  . ATRIAL FIBRILLATION ABLATION N/A 03/27/2014   PVI Dr Rayann Heman  . BACK SURGERY    . CARDIAC CATHETERIZATION N/A 02/03/2016   Procedure: Left Heart Cath and Coronary Angiography;  Surgeon: Nelva Bush, MD;  Location: [place] CV LAB;  Service: Cardiovascular;  Laterality: N/A;  . CARDIAC CATHETERIZATION N/A 02/03/2016   Procedure: Intravascular Pressure Wire/FFR Study;  Surgeon: Nelva Bush, MD;  Location: [place] CV LAB;  Service: Cardiovascular;  Laterality: N/A;  . CARDIAC CATHETERIZATION N/A 02/03/2016   Procedure: Coronary Stent Intervention;  Surgeon: Nelva Bush, MD;  Location: [place] CV LAB;  Service: Cardiovascular;  Laterality: N/A;  . CARDIAC SURGERY    . CARDIOVERSION N/A 08/25/2012   Procedure: CARDIOVERSION;  Surgeon: Thayer Headings, MD;  Location: [facility] ENDOSCOPY;  Service: Cardiovascular;  Laterality: N/A;  . CARDIOVERSION N/A 09/06/2012   Procedure: CARDIOVERSION/Bedside;  Surgeon: Carlena Bjornstad, MD;  Location: [place];  Service: Cardiovascular;  Laterality: N/A;  . CARDIOVERSION N/A 01/12/2014   Procedure: CARDIOVERSION;  Surgeon: Pixie Casino, MD;  Location: [facility] ENDOSCOPY;  Service: Cardiovascular;  Laterality: N/A;  . CARDIOVERSION N/A 01/30/2016   Procedure: CARDIOVERSION;  Surgeon: Sueanne Margarita, MD;  Location: [facility]  ENDOSCOPY;  Service: Cardiovascular;  Laterality: N/A;  . CARDIOVERSION N/A 04/14/2016   Procedure: CARDIOVERSION;  Surgeon: Pixie Casino, MD;  Location: [facility] ENDOSCOPY;  Service: Cardiovascular;  Laterality: N/A;  . COLONOSCOPY N/A 12/04/2015   Procedure: COLONOSCOPY;  Surgeon: Rogene Houston, MD;  [facility];  Service: Endoscopy;  Laterality: N/A;  730  . COLONOSCOPY WITH PROPOFOL N/A 04/28/2019   Procedure: COLONOSCOPY WITH PROPOFOL;  Surgeon: Rogene Houston, MD;  [facility];  Service: Endoscopy;  Laterality: N/A;  . CORONARY ANGIOPLASTY    . CORONARY ANGIOPLASTY WITH STENT PLACEMENT    . ELECTROPHYSIOLOGIC STUDY N/A 02/18/2016   Procedure: Atrial Fibrillation Ablation;  Surgeon: Thompson Grayer, MD;  Location: [place] CV LAB;  Service: Cardiovascular;  Laterality: N/A;  . ESOPHAGOGASTRODUODENOSCOPY (EGD) WITH PROPOFOL N/A 04/28/2019   Procedure: ESOPHAGOGASTRODUODENOSCOPY (EGD) WITH PROPOFOL;  Surgeon: Rogene Houston, MD;  [facility];  Service: Endoscopy;  Laterality: N/A;  7:30  . implantable loop recorder removal  03/01/2019   MDT Reveal LINQ removed in office  . INGUINAL HERNIA REPAIR Bilateral 06/04/2017   Procedure: OPEN  BILATERAL INGUINAL HERNIA REPAIR;  Surgeon: Judeth Horn, MD;  Location: [place];  Service: General;  Laterality: Bilateral;  . LOOP RECORDER IMPLANT N/A 06/29/2014   Procedure: LOOP RECORDER IMPLANT;  Surgeon: Thompson Grayer, MD;  Location: [facility] CATH LAB;  Service: Cardiovascular;  Laterality: N/A;  . NOSE SURGERY     sleep apnea surgery  . PERIPHERAL VASCULAR CATHETERIZATION N/A 03/13/2016   Procedure: Thrombin Injection;  Surgeon: Serafina Mitchell, MD;  Location: [place] CV LAB;  Service: Cardiovascular;  Laterality: N/A;  . POLYPECTOMY  04/28/2019   Procedure: POLYPECTOMY;  Surgeon: Rogene Houston, MD;  [facility];  Service: Endoscopy;;  duodenum;splenic flexure;  . STOMACH SURGERY     reflux, fundoplication  .  TEE WITHOUT CARDIOVERSION N/A 08/25/2012   Procedure: TRANSESOPHAGEAL ECHOCARDIOGRAM (TEE);  Surgeon: Thayer Headings, MD;  Location: [place];  Service: Cardiovascular;  Laterality: N/A;  . TEE WITHOUT CARDIOVERSION  03/23/2013   DR ROSS  . TEE WITHOUT CARDIOVERSION N/A 03/23/2013   Procedure: TRANSESOPHAGEAL ECHOCARDIOGRAM (TEE);  Surgeon: Fay Records, MD;  Location[facility] ENDOSCOPY;  Service: Cardiovascular;  Laterality: N/A;  . TEE WITHOUT CARDIOVERSION N/A 03/26/2014   Procedure: TRANSESOPHAGEAL ECHOCARDIOGRAM (TEE);  Surgeon: Josue Hector, MD;  Location: [facility] ENDOSCOPY;  Service: Cardiovascular;  Laterality: N/A;  . TONSILLECTOMY      Allergies: No Known Allergies    Home Medications:  Current Outpatient Medications:  .  albuterol (VENTOLIN HFA) 108 (90 Base) MCG/ACT inhaler, Inhale 2 puffs into the lungs every 6 (six) hours as needed for wheezing or shortness of breath., Disp: 8 g, Rfl: 0 .  aspirin EC 81 MG tablet, Take 81 mg by mouth daily. Swallow whole., Disp: , Rfl:  .  atorvastatin (LIPITOR) 10 MG tablet, Take 1 tablet (10 mg total) by mouth daily., Disp: 30 tablet, Rfl: 0 .  azelastine (ASTELIN) 0.1 % nasal spray, Place 2 sprays into both nostrils 2 (two) times daily., Disp: 30 mL, Rfl: 12 .  blood glucose meter kit and supplies, Dispense based on patient and insurance preference. Use to check blood sugar once a day(FOR ICD-10 E10.9, E11.9)., Disp: 1 each, Rfl: 0 .  Cholecalciferol (VITAMIN D) 2000 UNITS CAPS, Take 4,000 Units by mouth daily. , Disp: , Rfl:  .  dofetilide (TIKOSYN) 500 MCG capsule, TAKE 1 CAPSULE BY MOUTH  TWICE DAILY, Disp: 180 capsule, Rfl: 0 .  enalapril (VASOTEC) 10 MG tablet, Take 1 tablet (10 mg total) by mouth 2 (two) times daily., Disp: 180 tablet, Rfl: 3 .  famotidine (PEPCID) 20 MG tablet, TAKE 1 TABLET BY MOUTH AT BEDTIME, Disp: 30 tablet, Rfl: 0 .  Ferrous Sulfate (IRON) 325 (65 Fe) MG TABS, Take 1 tablet (325 mg total) by mouth daily., Disp: 30  tablet, Rfl: 1 .  fluticasone (FLONASE) 50 MCG/ACT nasal spray, Place 1 spray into both nostrils daily., Disp: , Rfl:  .  Fluticasone-Salmeterol (ADVAIR DISKUS) 250-50 MCG/DOSE AEPB, Inhale 1 puff into the lungs every 12 (twelve) hours., Disp: 60 each, Rfl: 0 .  furosemide (LASIX) 40 MG tablet, Take 1 tablet (40 mg total) by mouth daily., Disp: 90 tablet, Rfl: 0 .  hydrOXYzine (VISTARIL) 25 MG capsule, Take 2 capsules (50 mg total) by mouth 3 (three) times daily as needed for anxiety., Disp: 30 capsule, Rfl: 0 .  loratadine (CLARITIN) 10 MG tablet, Take 10 mg by mouth daily., Disp: , Rfl:  .  MAGNESIUM PO, Take 500 mg by mouth daily. , Disp: ,  Rfl:  .  metFORMIN (GLUCOPHAGE) 1000 MG tablet, Take 1 tablet (1,000 mg total) by mouth 2 (two) times daily with a meal., Disp: 180 tablet, Rfl: 1 .  metoprolol tartrate (LOPRESSOR) 50 MG tablet, Take 50 mg by mouth daily., Disp: , Rfl:  .  nitroGLYCERIN (NITROSTAT) 0.4 MG SL tablet, DISSOLVE 1 TABLET UNDER  TONGUE EVERY 5 MINUTES AS  NEEDED FOR CHEST PAIN MAX  OF 3 TABLETS IN 15 MINUTES. CALL 911 IF PAIN PERSISTS, Disp: 100 tablet, Rfl: 0 .  oxybutynin (DITROPAN) 5 MG tablet, TAKE ONE-HALF TABLET (2.98m) BY  MOUTH TWO TIMES DAILY AS  NEEDED FOR BLADDER SPASMS (Patient taking differently: Take 2.5 mg by mouth 2 (two) times daily as needed for bladder spasms. TAKE ONE-HALF TABLET (2.562m BY  MOUTH TWO TIMES DAILY AS  NEEDED FOR BLADDER SPASMS), Disp: 30 tablet, Rfl: 5 .  potassium chloride SA (KLOR-CON) 20 MEQ tablet, TAKE 1 TABLET (2064mBY MOUTH  EVERY DAY, Disp: 90 tablet, Rfl: 1 .  vitamin B-12 (CYANOCOBALAMIN) 500 MCG tablet, Take 500 mcg by mouth daily., Disp: , Rfl:  .  XARELTO 20 MG TABS tablet, TAKE 1 TABLET BY MOUTH ONCE DAILY WITH SUPPER, Disp: 30 tablet, Rfl: 6 .  pantoprazole (PROTONIX) 40 MG tablet, Take 1 tablet (40 mg total) by mouth daily., Disp: 180 tablet, Rfl: 3   Family History: family history includes Anemia in his mother; Aneurysm in his  maternal grandmother; CAD (age of onset: 50)27n his father; Diabetes in his brother, father, mother, and paternal grandmother; Gout in his mother; Heart attack in his father; Stroke in his maternal grandfather and paternal grandfather; Transient ischemic attack in his father; Vascular Disease in his father.    Social History:   reports that he quit smoking about 9 years ago. His smoking use included cigarettes. He started smoking about 16 years ago. He has a 7.00 pack-year smoking history. He has never used smokeless tobacco. He reports that he does not drink alcohol and does not use drugs.   Review of Systems: Constitutional: Denies weight loss/weight gain  Eyes: No changes in vision. ENT: No oral lesions, sore throat.  GI: see HPI.  Heme/Lymph: No easy bruising.  CV: No chest pain.  GU: No hematuria.  Integumentary: No rashes.  Neuro: No headaches.  Psych: No depression/anxiety.  Endocrine: No heat/cold intolerance.  Allergic/Immunologic: No urticaria.  Resp: No cough, SOB.  Musculoskeletal: No joint swelling.    Physical Examination: BP 119/69 (BP Location: Right Arm, Patient Position: Sitting, Cuff Size: Normal)   Pulse 78   Temp 98.6 F (37 C) (Oral)   Ht 6' 1" (1.854 m)   Wt 260 lb 9.6 oz (118.2 kg)   BMI 34.38 kg/m  Gen: NAD, alert and oriented x 4, overweight HEENT: PEERLA, EOMI, Neck: supple, no JVD Chest: CTA bilaterally, no wheezes, crackles, or other adventitious sounds CV: RRR, no m/g/c/r Abd: soft, NT, ND, +BS in all four quadrants; no HSM, guarding, ridigity, or rebound tenderness Ext: no edema, well perfused with 2+ pulses, Skin: no rash or lesions noted on observed skin Lymph: no noted LAD  Data Reviewed:  01/29/20-ferritin 12, iron 115, Hgb 13.5, CMP normal.    Assessment/Plan: Mr. SheSerfass a 54 68o. male seen for follow-up.  1.  History of duodenal adenoma-he is due for repeat 6-m35-monthoscopy and will schedule today.  Denies prior issues with  sedation.  2.  GERD-he is symptomatic on pantoprazole 40 mg once a day, will  try twice a day dosing until his EGD he can continue to use Pepcid as needed.  Diet modifications reviewed.  Due for repeat colonoscopy 2028  Will need to verify Xarelto can be held prior to procedure  Tylon was seen today for follow-up.  Diagnoses and all orders for this visit:  Chronic GERD  Long term current use of anticoagulant therapy  Other orders -     pantoprazole (PROTONIX) 40 MG tablet; Take 1 tablet (40 mg total) by mouth daily.     I personally performed the service, non-incident to. (WP)  Laurine Blazer, [facility]

## 2020-02-05 NOTE — Telephone Encounter (Signed)
02/05/20  Austin Woods 10/13/1965   Request for medication clearance to hold medication prior to procedure:    Procedure to be performed? Endoscopy   Procedure Date: 02/28/20    Medications that need to be held prior to procedure and how long?   Xarelto 2 days before procedure    Name of physician performing procedure? Dr Laural Golden     Anesthesia type? Monitored (propofol)   ______ Yes, the above patient can stop the medicine indicated above   ______ No, the above cannot stop the medicine indicated above    ______________________________     ______________________         Doctor Signature             Date   Please fax this back at [phone]. If you have questions I can be reached at [phone].  Thank you   GI

## 2020-02-05 NOTE — Telephone Encounter (Signed)
Patient with diagnosis of afib on Xarelto for anticoagulation.    Procedure: endoscopy Date of procedure: 02/28/20  CHA2DS2-VASc Score = 4  This indicates a 4.8% annual risk of stroke. The patient's score is based upon: CHF History: 1 HTN History: 1 Diabetes History: 1 Stroke History: 0 Vascular Disease History: 1 Age Score: 0 Gender Score: 0  Did also have single DVT in 2001.  CrCl >169mL/min Platelet count 357K  Per office protocol, patient can hold Xarelto for 2 days prior to procedure.

## 2020-02-05 NOTE — Telephone Encounter (Signed)
Clinical pharmacist to review 

## 2020-02-05 NOTE — Patient Instructions (Signed)
We are scheduling endoscopy for evaluation. We are increasing protonix to 40mg  twice a day - take 30 min before meals.

## 2020-02-05 NOTE — Telephone Encounter (Signed)
Please clarify if this is a pharmacy clearance only or combined pharmacy/medical clearance which will require a follow up visit.

## 2020-02-06 MED ORDER — PANTOPRAZOLE SODIUM 40 MG PO TBEC: 40.0000 mg | DELAYED_RELEASE_TABLET | Freq: Two times a day (BID) | ORAL | 1 refills | Status: DC

## 2020-02-06 NOTE — Telephone Encounter (Addendum)
Left voice message for the patient to give me a callback at the office to discuss holding his Xarelto prior to upcoming procedure

## 2020-02-06 NOTE — Telephone Encounter (Signed)
Per Laurine Blazer our PA pharmacy clearance. Thanks

## 2020-02-06 NOTE — Telephone Encounter (Signed)
Pt returned call to Ellison Carwin , he stated it he is only available to take calls after 4pm

## 2020-02-06 NOTE — Telephone Encounter (Signed)
Please inform the patient to hold Xarelto for 2 days prior to GI procedure and restart as soon as possible after the procedure.

## 2020-02-06 NOTE — Telephone Encounter (Signed)
Returned call to patient and informed him that he has been cleared from a cardiac standpoint and to hold his Xarelto for 2 days prior to procedure and to restart as soon as possible afterwards. He verbalized understanding and thanked me for calling.

## 2020-02-08 DIAGNOSIS — Z23 Encounter for immunization: Secondary | ICD-10-CM | POA: Diagnosis not present

## 2020-02-14 ENCOUNTER — Other Ambulatory Visit (INDEPENDENT_AMBULATORY_CARE_PROVIDER_SITE_OTHER): Payer: Self-pay | Admitting: Gastroenterology

## 2020-02-14 ENCOUNTER — Other Ambulatory Visit: Payer: Self-pay | Admitting: Internal Medicine

## 2020-02-14 DIAGNOSIS — R110 Nausea: Secondary | ICD-10-CM

## 2020-02-14 DIAGNOSIS — K219 Gastro-esophageal reflux disease without esophagitis: Secondary | ICD-10-CM

## 2020-02-15 NOTE — Telephone Encounter (Signed)
This is a [facility] pt °

## 2020-02-26 ENCOUNTER — Other Ambulatory Visit (INDEPENDENT_AMBULATORY_CARE_PROVIDER_SITE_OTHER): Payer: Self-pay

## 2020-02-26 DIAGNOSIS — K219 Gastro-esophageal reflux disease without esophagitis: Secondary | ICD-10-CM

## 2020-02-26 DIAGNOSIS — D132 Benign neoplasm of duodenum: Secondary | ICD-10-CM

## 2020-02-26 NOTE — Patient Instructions (Signed)
Austin Woods  02/26/2020     @PREFPERIOPPHARMACY @   Your procedure is scheduled on  02/28/2020.  Report to [facility] at  1100  A.M.  Call this number if you have problems the morning of surgery:  [phone]   Remember:  Follow the diet instructions given to you by the office.                      Take these medicines the morning of surgery with A SIP OF WATER  Tikosyn, vasotec, vistaril, claritin, metoprolol, protonix.    Do not wear jewelry, make-up or nail polish.  Do not wear lotions, powders, or perfumes. Please wear deodorant and brush your teeth.  Do not shave 48 hours prior to surgery.  Men may shave face and neck.  Do not bring valuables to the hospital.  [facility] is not responsible for any belongings or valuables.  Contacts, dentures or bridgework may not be worn into surgery.  Leave your suitcase in the car.  After surgery it may be brought to your room.  For patients admitted to the hospital, discharge time will be determined by your treatment team.  Patients discharged the day of surgery will not be allowed to drive home.   Name and phone number of your driver:   family Special instructions:   DO NOT smoke the morning of your procedure.  Please read over the following fact sheets that you were given. Anesthesia Post-op Instructions and Care and Recovery After Surgery       Upper Endoscopy, Adult, Care After This sheet gives you information about how to care for yourself after your procedure. Your health care provider may also give you more specific instructions. If you have problems or questions, contact your health care provider. What can I expect after the procedure? After the procedure, it is common to have:  A sore throat.  Mild stomach pain or discomfort.  Bloating.  Nausea. Follow these instructions at home:   Follow instructions from your health care provider about what to eat or drink after your procedure.  Return to your  normal activities as told by your health care provider. Ask your health care provider what activities are safe for you.  Take over-the-counter and prescription medicines only as told by your health care provider.  Do not drive for 24 hours if you were given a sedative during your procedure.  Keep all follow-up visits as told by your health care provider. This is important. Contact a health care provider if you have:  A sore throat that lasts longer than one day.  Trouble swallowing. Get help right away if:  You vomit blood or your vomit looks like coffee grounds.  You have: ? A fever. ? Bloody, black, or tarry stools. ? A severe sore throat or you cannot swallow. ? Difficulty breathing. ? Severe pain in your chest or abdomen. Summary  After the procedure, it is common to have a sore throat, mild stomach discomfort, bloating, and nausea.  Do not drive for 24 hours if you were given a sedative during the procedure.  Follow instructions from your health care provider about what to eat or drink after your procedure.  Return to your normal activities as told by your health care provider. This information is not intended to replace advice given to you by your health care provider. Make sure you discuss any questions you have with your health care provider. Document  Revised: 09/14/2017 Document Reviewed: 08/23/2017 Elsevier Patient Education  [place] After These instructions provide you with information about caring for yourself after your procedure. Your health care provider may also give you more specific instructions. Your treatment has been planned according to current medical practices, but problems sometimes occur. Call your health care provider if you have any problems or questions after your procedure. What can I expect after the procedure? After your procedure, you may:  Feel sleepy for several hours.  Feel clumsy and have poor  balance for several hours.  Feel forgetful about what happened after the procedure.  Have poor judgment for several hours.  Feel nauseous or vomit.  Have a sore throat if you had a breathing tube during the procedure. Follow these instructions at home: For at least 24 hours after the procedure:      Have a responsible adult stay with you. It is important to have someone help care for you until you are awake and alert.  Rest as needed.  Do not: ? Participate in activities in which you could fall or become injured. ? Drive. ? Use heavy machinery. ? Drink alcohol. ? Take sleeping pills or medicines that cause drowsiness. ? Make important decisions or sign legal documents. ? Take care of children on your own. Eating and drinking  Follow the diet that is recommended by your health care provider.  If you vomit, drink water, juice, or soup when you can drink without vomiting.  Make sure you have little or no nausea before eating solid foods. General instructions  Take over-the-counter and prescription medicines only as told by your health care provider.  If you have sleep apnea, surgery and certain medicines can increase your risk for breathing problems. Follow instructions from your health care provider about wearing your sleep device: ? Anytime you are sleeping, including during daytime naps. ? While taking prescription pain medicines, sleeping medicines, or medicines that make you drowsy.  If you smoke, do not smoke without supervision.  Keep all follow-up visits as told by your health care provider. This is important. Contact a health care provider if:  You keep feeling nauseous or you keep vomiting.  You feel light-headed.  You develop a rash.  You have a fever. Get help right away if:  You have trouble breathing. Summary  For several hours after your procedure, you may feel sleepy and have poor judgment.  Have a responsible adult stay with you for at least  24 hours or until you are awake and alert. This information is not intended to replace advice given to you by your health care provider. Make sure you discuss any questions you have with your health care provider. Document Revised: 06/21/2017 Document Reviewed: 07/14/2015 Elsevier Patient Education  [place].

## 2020-02-27 ENCOUNTER — Encounter (HOSPITAL_COMMUNITY): Payer: Self-pay

## 2020-02-27 ENCOUNTER — Other Ambulatory Visit: Payer: Self-pay

## 2020-02-27 ENCOUNTER — Other Ambulatory Visit (HOSPITAL_COMMUNITY)
Admission: RE | Admit: 2020-02-27 | Discharge: 2020-02-27 | Disposition: A | Discharge status: Home / Self Care | Payer: BC Managed Care – PPO | Source: Ambulatory Visit | Attending: Internal Medicine | Admitting: Internal Medicine

## 2020-02-27 ENCOUNTER — Encounter (HOSPITAL_COMMUNITY)
Admission: RE | Admit: 2020-02-27 | Discharge: 2020-02-27 | Disposition: A | Discharge status: Home / Self Care | Payer: BC Managed Care – PPO | Source: Ambulatory Visit | Attending: Internal Medicine | Admitting: Internal Medicine

## 2020-02-27 DIAGNOSIS — Z7984 Long term (current) use of oral hypoglycemic drugs: Secondary | ICD-10-CM | POA: Diagnosis not present

## 2020-02-27 DIAGNOSIS — Z87891 Personal history of nicotine dependence: Secondary | ICD-10-CM | POA: Diagnosis not present

## 2020-02-27 DIAGNOSIS — Z01812 Encounter for preprocedural laboratory examination: Secondary | ICD-10-CM | POA: Insufficient documentation

## 2020-02-27 DIAGNOSIS — Z20822 Contact with and (suspected) exposure to covid-19: Secondary | ICD-10-CM | POA: Insufficient documentation

## 2020-02-27 DIAGNOSIS — Z79899 Other long term (current) drug therapy: Secondary | ICD-10-CM | POA: Diagnosis not present

## 2020-02-27 DIAGNOSIS — K319 Disease of stomach and duodenum, unspecified: Secondary | ICD-10-CM | POA: Diagnosis not present

## 2020-02-27 DIAGNOSIS — Z7951 Long term (current) use of inhaled steroids: Secondary | ICD-10-CM | POA: Diagnosis not present

## 2020-02-27 DIAGNOSIS — D509 Iron deficiency anemia, unspecified: Secondary | ICD-10-CM | POA: Diagnosis not present

## 2020-02-27 DIAGNOSIS — K259 Gastric ulcer, unspecified as acute or chronic, without hemorrhage or perforation: Secondary | ICD-10-CM | POA: Diagnosis not present

## 2020-02-27 DIAGNOSIS — Z09 Encounter for follow-up examination after completed treatment for conditions other than malignant neoplasm: Secondary | ICD-10-CM | POA: Diagnosis not present

## 2020-02-27 DIAGNOSIS — K219 Gastro-esophageal reflux disease without esophagitis: Secondary | ICD-10-CM | POA: Diagnosis not present

## 2020-02-27 DIAGNOSIS — Z8719 Personal history of other diseases of the digestive system: Secondary | ICD-10-CM | POA: Diagnosis not present

## 2020-02-27 LAB — SARS CORONAVIRUS 2 (TAT 6-24 HRS): SARS Coronavirus 2: NEGATIVE

## 2020-02-28 ENCOUNTER — Encounter (HOSPITAL_COMMUNITY): Payer: Self-pay | Admitting: Internal Medicine

## 2020-02-28 ENCOUNTER — Ambulatory Visit (HOSPITAL_COMMUNITY): Payer: BC Managed Care – PPO | Admitting: Anesthesiology

## 2020-02-28 ENCOUNTER — Ambulatory Visit (HOSPITAL_COMMUNITY)
Admission: RE | Admit: 2020-02-28 | Discharge: 2020-02-28 | Disposition: A | Discharge status: Home / Self Care | Payer: BC Managed Care – PPO | Attending: Internal Medicine | Admitting: Internal Medicine

## 2020-02-28 ENCOUNTER — Encounter (HOSPITAL_COMMUNITY)
Admission: RE | Disposition: A | Discharge status: Home / Self Care | Payer: Self-pay | Source: Home / Self Care | Attending: Internal Medicine

## 2020-02-28 DIAGNOSIS — K259 Gastric ulcer, unspecified as acute or chronic, without hemorrhage or perforation: Secondary | ICD-10-CM | POA: Diagnosis not present

## 2020-02-28 DIAGNOSIS — Z79899 Other long term (current) drug therapy: Secondary | ICD-10-CM | POA: Insufficient documentation

## 2020-02-28 DIAGNOSIS — Z87891 Personal history of nicotine dependence: Secondary | ICD-10-CM | POA: Insufficient documentation

## 2020-02-28 DIAGNOSIS — D132 Benign neoplasm of duodenum: Secondary | ICD-10-CM

## 2020-02-28 DIAGNOSIS — K319 Disease of stomach and duodenum, unspecified: Secondary | ICD-10-CM | POA: Diagnosis not present

## 2020-02-28 DIAGNOSIS — Z7984 Long term (current) use of oral hypoglycemic drugs: Secondary | ICD-10-CM | POA: Diagnosis not present

## 2020-02-28 DIAGNOSIS — Z7951 Long term (current) use of inhaled steroids: Secondary | ICD-10-CM | POA: Diagnosis not present

## 2020-02-28 DIAGNOSIS — J449 Chronic obstructive pulmonary disease, unspecified: Secondary | ICD-10-CM | POA: Diagnosis not present

## 2020-02-28 DIAGNOSIS — K317 Polyp of stomach and duodenum: Secondary | ICD-10-CM | POA: Diagnosis not present

## 2020-02-28 DIAGNOSIS — I110 Hypertensive heart disease with heart failure: Secondary | ICD-10-CM | POA: Diagnosis not present

## 2020-02-28 DIAGNOSIS — Z20822 Contact with and (suspected) exposure to covid-19: Secondary | ICD-10-CM | POA: Insufficient documentation

## 2020-02-28 DIAGNOSIS — Z09 Encounter for follow-up examination after completed treatment for conditions other than malignant neoplasm: Secondary | ICD-10-CM | POA: Insufficient documentation

## 2020-02-28 DIAGNOSIS — Z8719 Personal history of other diseases of the digestive system: Secondary | ICD-10-CM | POA: Insufficient documentation

## 2020-02-28 DIAGNOSIS — D509 Iron deficiency anemia, unspecified: Secondary | ICD-10-CM | POA: Diagnosis not present

## 2020-02-28 DIAGNOSIS — K3189 Other diseases of stomach and duodenum: Secondary | ICD-10-CM | POA: Diagnosis not present

## 2020-02-28 DIAGNOSIS — K219 Gastro-esophageal reflux disease without esophagitis: Secondary | ICD-10-CM | POA: Insufficient documentation

## 2020-02-28 HISTORY — PX: ESOPHAGOGASTRODUODENOSCOPY (EGD) WITH PROPOFOL: SHX5813

## 2020-02-28 HISTORY — PX: BIOPSY: SHX5522

## 2020-02-28 LAB — GLUCOSE, CAPILLARY: Glucose-Capillary: 125 mg/dL — ABNORMAL HIGH (ref 70–99)

## 2020-02-28 SURGERY — ESOPHAGOGASTRODUODENOSCOPY (EGD) WITH PROPOFOL
Anesthesia: General

## 2020-02-28 MED ORDER — LACTATED RINGERS IV SOLN: INTRAVENOUS | Status: DC | PRN

## 2020-02-28 MED ORDER — STERILE WATER FOR IRRIGATION IR SOLN
Status: DC | PRN
  Administered 2020-02-28: 2.5 mL

## 2020-02-28 MED ORDER — CHLORHEXIDINE GLUCONATE CLOTH 2 % EX PADS: 6.0000 | MEDICATED_PAD | Freq: Once | CUTANEOUS | Status: DC

## 2020-02-28 MED ORDER — PROPOFOL 10 MG/ML IV BOLUS
INTRAVENOUS | Status: DC | PRN
  Administered 2020-02-28: 100 mg via INTRAVENOUS
  Administered 2020-02-28: 50 mg via INTRAVENOUS

## 2020-02-28 MED ORDER — PROPOFOL 500 MG/50ML IV EMUL
INTRAVENOUS | Status: DC | PRN
  Administered 2020-02-28: 150 ug/kg/min via INTRAVENOUS

## 2020-02-28 MED ORDER — LIDOCAINE VISCOUS HCL 2 % MT SOLN
15.0000 mL | Freq: Once | OROMUCOSAL | Status: AC
  Administered 2020-02-28: 15 mL via OROMUCOSAL

## 2020-02-28 MED ORDER — LIDOCAINE HCL (CARDIAC) PF 100 MG/5ML IV SOSY
PREFILLED_SYRINGE | INTRAVENOUS | Status: DC | PRN
  Administered 2020-02-28: 50 mg via INTRAVENOUS

## 2020-02-28 MED ORDER — LIDOCAINE VISCOUS HCL 2 % MT SOLN
OROMUCOSAL | Status: AC
  Filled 2020-02-28: qty 15

## 2020-02-28 MED ORDER — ESOMEPRAZOLE MAGNESIUM 40 MG PO CPDR: 40.0000 mg | DELAYED_RELEASE_CAPSULE | Freq: Two times a day (BID) | ORAL | 3 refills | Status: DC

## 2020-02-28 MED ORDER — LACTATED RINGERS IV SOLN
Freq: Once | INTRAVENOUS | Status: AC
  Administered 2020-02-28: 1000 mL via INTRAVENOUS

## 2020-02-28 NOTE — Discharge Instructions (Addendum)
Upper Endoscopy, Adult, Care After This sheet gives you information about how to care for yourself after your procedure. Your health care provider may also give you more specific instructions. If you have problems or questions, contact your health care provider. What can I expect after the procedure? After the procedure, it is common to have:  A sore throat.  Mild stomach pain or discomfort.  Bloating.  Nausea. Follow these instructions at home:   Follow instructions from your health care provider about what to eat or drink after your procedure.  Return to your normal activities as told by your health care provider. Ask your health care provider what activities are safe for you.  Take over-the-counter and prescription medicines only as told by your health care provider.  Do not drive for 24 hours if you were given a sedative during your procedure.  Keep all follow-up visits as told by your health care provider. This is important. Contact a health care provider if you have:  A sore throat that lasts longer than one day.  Trouble swallowing. Get help right away if:  You vomit blood or your vomit looks like coffee grounds.  You have: ? A fever. ? Bloody, black, or tarry stools. ? A severe sore throat or you cannot swallow. ? Difficulty breathing. ? Severe pain in your chest or abdomen. Summary  After the procedure, it is common to have a sore throat, mild stomach discomfort, bloating, and nausea.  Do not drive for 24 hours if you were given a sedative during the procedure.  Follow instructions from your health care provider about what to eat or drink after your procedure.  Return to your normal activities as told by your health care provider. This information is not intended to replace advice given to you by your health care provider. Make sure you discuss any questions you have with your health care provider. Document Revised: 09/14/2017 Document Reviewed:  08/23/2017 Elsevier Patient Education  [place]. Resume aspirin and Xarelto on 02/29/2020 Discontinue pantoprazole and famotidine. Begin Nexium/esomeprazole 40 mg by mouth 30 minutes before breakfast and evening meal. Resume other medications and diet as before. No driving for 24 hours. Physician will call with biopsy results.

## 2020-02-28 NOTE — Op Note (Signed)
[facility] Patient Name: Austin Woods Procedure Date: 02/28/2020 9:39 AM MRN: [id number] Date of Birth: 03/20/1966 Attending MD: Hildred Laser , MD CSN: [id number] Age: 54 Admit Type: Outpatient Procedure:                Upper GI endoscopy Indications:              Polyps in the duodenum, Follow-up of polyps in the                            duodenum Providers:                Hildred Laser, MD, Lurline Del, RN, Raphael Gibney,                            Technician Referring MD:             Lowell Guitar. Lovena Le, DO Medicines:                Propofol per Anesthesia Complications:            No immediate complications. Estimated Blood Loss:     Estimated blood loss was minimal. Procedure:                Pre-Anesthesia Assessment:                           - Prior to the procedure, a History and Physical                            was performed, and patient medications and                            allergies were reviewed. The patient's tolerance of                            previous anesthesia was also reviewed. The risks                            and benefits of the procedure and the sedation                            options and risks were discussed with the patient.                            All questions were answered, and informed consent                            was obtained. Prior Anticoagulants: The patient                            last took Xarelto (rivaroxaban) 3 days prior to the                            procedure. ASA Grade Assessment: III - A patient  with severe systemic disease. After reviewing the                            risks and benefits, the patient was deemed in                            satisfactory condition to undergo the procedure.                           After obtaining informed consent, the endoscope was                            passed under direct vision. Throughout the                            procedure, the  patient's blood pressure, pulse, and                            oxygen saturations were monitored continuously. The                            [id number]-H190 ([id number]) scope was introduced through the                            mouth, and advanced to the third part of duodenum.                            The upper GI endoscopy was accomplished without                            difficulty. The patient tolerated the procedure                            well. Scope In: 9:59:51 AM Scope Out: 10:12:28 AM Total Procedure Duration: 0 hours 12 minutes 37 seconds  Findings:      The hypopharynx was normal.      The examined esophagus was normal.      The Z-line was regular and was found 43 cm from the incisors.      A few dispersed erosions with no stigmata of recent bleeding were found       in the prepyloric region of the stomach. Biopsies were taken with a cold       forceps for histology. The pathology specimen was placed into Bottle       Number 1.      Loose fundal wrap otherwise normal examination of rest of the stomach.      The duodenal bulb and third portion of the duodenum were normal.      There is no endoscopic evidence of recurrent or residual polyp but small       scar noted at previous polypectomy site in the second portion of the       duodenum. Impression:               - Normal hypopharynx.                           -  Normal esophagus.                           - Z-line regular, 43 cm from the incisors.                           - Erosive gastropathy with no stigmata of recent                            bleeding. Biopsied.                           -Loose fundal wrap.                           - Normal duodenal bulb and third portion of the                            duodenum.                           -No evidence of residual or recurrent duodenal                            polyp. Moderate Sedation:      Per Anesthesia Care Recommendation:           - Patient has a contact  number available for                            emergencies. The signs and symptoms of potential                            delayed complications were discussed with the                            patient. Return to normal activities tomorrow.                            Written discharge instructions were provided to the                            patient.                           - Resume previous diet today.                           - Continue present medications except discontinue                            pantoprazole and famotidine.                           - Esomeprazole 40 mg by mouth 30 minutes before                            breakfast  and evening meal.                           - Resume Xarelto (rivaroxaban) tomorrow and                            previous antiplatelet medication tomorrow at prior                            doses.                           - Await pathology results. Procedure Code(s):        --- Professional ---                           [phone], Esophagogastroduodenoscopy, flexible,                            transoral; with biopsy, single or multiple Diagnosis Code(s):        --- Professional ---                           K31.89, Other diseases of stomach and duodenum                           K31.7, Polyp of stomach and duodenum CPT copyright 2019 American Medical Association. All rights reserved. The codes documented in this report are preliminary and upon coder review may  be revised to meet current compliance requirements. Hildred Laser, MD Hildred Laser, MD 02/28/2020 10:29:25 AM This report has been signed electronically. Number of Addenda: 0

## 2020-02-28 NOTE — Anesthesia Postprocedure Evaluation (Signed)
Anesthesia Post Note  Patient: Austin Woods  Procedure(s) Performed: ESOPHAGOGASTRODUODENOSCOPY (EGD) WITH PROPOFOL (N/A ) BIOPSY  Patient location during evaluation: PACU Anesthesia Type: General Level of consciousness: awake and alert and oriented Pain management: pain level controlled Vital Signs Assessment: post-procedure vital signs reviewed and stable Respiratory status: spontaneous breathing Cardiovascular status: blood pressure returned to baseline Postop Assessment: no headache and no apparent nausea or vomiting Anesthetic complications: no   No complications documented.   Last Vitals:  Vitals:   02/28/20 1030 02/28/20 1040  BP: 113/82 113/80  Pulse: (!) 52 (!) 54  Resp: (!) 22 20  Temp:  36.8 C  SpO2: 98% 95%    Last Pain:  Vitals:   02/28/20 1040  TempSrc: Oral  PainSc: 0-No pain                 Adelai Achey C Maleki Hippe

## 2020-02-28 NOTE — Transfer of Care (Signed)
Immediate Anesthesia Transfer of Care Note  Patient: Austin Woods  Procedure(s) Performed: ESOPHAGOGASTRODUODENOSCOPY (EGD) WITH PROPOFOL (N/A ) BIOPSY  Patient Location: PACU  Anesthesia Type:General  Level of Consciousness: drowsy  Airway & Oxygen Therapy: Patient Spontanous Breathing and Patient connected to nasal cannula oxygen  Post-op Assessment: Report given to RN and Post -op Vital signs reviewed and stable  Post vital signs: Reviewed and stable  Last Vitals:  Vitals Value Taken Time  BP    Temp    Pulse 49 02/28/20 1018  Resp 17 02/28/20 1018  SpO2 98 % 02/28/20 1018  Vitals shown include unvalidated device data.  Last Pain:  Vitals:   02/28/20 0835  TempSrc: Oral  PainSc: 0-No pain      Patients Stated Pain Goal: 8 (94/[id number])  Complications: No complications documented.

## 2020-02-28 NOTE — Anesthesia Preprocedure Evaluation (Signed)
Anesthesia Evaluation  Patient identified by MRN, date of birth, ID band Patient awake    Reviewed: Allergy & Precautions, NPO status , Patient's Chart, lab work & pertinent test results  History of Anesthesia Complications Negative for: history of anesthetic complications  Airway Mallampati: II  TM Distance: >3 FB Neck ROM: Full    Dental  (+) Dental Advisory Given, Teeth Intact   Pulmonary asthma , sleep apnea and Continuous Positive Airway Pressure Ventilation , neg COPD,  COPD inhaler, former smoker,    Pulmonary exam normal breath sounds clear to auscultation       Cardiovascular Exercise Tolerance: Good hypertension, Pt. on medications and Pt. on home beta blockers + angina + CAD, + Cardiac Stents, +CHF and + DVT  + dysrhythmias (wpw syndrome - ablation)  Rhythm:Regular Rate:Bradycardia - Systolic murmurs, - Diastolic murmurs, - Friction Rub, - Carotid Bruit, - Peripheral Edema and - Systolic Click Please let the patient know his echocardiogram showed normal pumping function of the heart with a preserved EF of 60-65%. No significant wall motion abnormalities or valve abnormalities. Given elevated creatinine earlier and reassuring echo results, would reduce Lasix to 20mg  daily.   Hgb A1c was checked as well as the patient requested this due to missing prior labs. Was elevated to 7.1 having been at 5.9 five months prior   Neuro/Psych  Headaches, negative psych ROS   GI/Hepatic GERD  Medicated,  Endo/Other  diabetes, Well Controlled, Type 2, Oral Hypoglycemic Agents  Renal/GU      Musculoskeletal  (+) Arthritis  (back sx),   Abdominal   Peds  Hematology  (+) anemia ,   Anesthesia Other Findings   Reproductive/Obstetrics                             Anesthesia Physical Anesthesia Plan  ASA: III  Anesthesia Plan: General   Post-op Pain Management:    Induction: Intravenous  PONV  Risk Score and Plan: TIVA  Airway Management Planned: Nasal Cannula, Natural Airway and Simple Face Mask  Additional Equipment:   Intra-op Plan:   Post-operative Plan: Extubation in OR  Informed Consent: I have reviewed the patients History and Physical, chart, labs and discussed the procedure including the risks, benefits and alternatives for the proposed anesthesia with the patient or authorized representative who has indicated his/her understanding and acceptance.     Dental advisory given  Plan Discussed with: CRNA and Surgeon  Anesthesia Plan Comments:         Anesthesia Quick Evaluation

## 2020-02-28 NOTE — Progress Notes (Signed)
   To Whom It May Concern :  Due to Mr. Austin Woods procedure today, he may return to work on    Nov.29th, 2021.      Sandria Senter

## 2020-02-28 NOTE — Anesthesia Procedure Notes (Signed)
Date/Time: 02/28/2020 10:01 AM Performed by: Orlie Dakin, CRNA Pre-anesthesia Checklist: Patient identified, Emergency Drugs available, Suction available and Patient being monitored Patient Re-evaluated:Patient Re-evaluated prior to induction Oxygen Delivery Method: Nasal cannula Induction Type: IV induction Placement Confirmation: positive ETCO2

## 2020-02-28 NOTE — H&P (Signed)
Austin Woods is an 54 y.o. male.   Chief Complaint: Patient is here for esophagogastroduodenoscopy. HPI: Patient is 54 year old Caucasian male who underwent esophagogastroduodenoscopy back in January 2021 for iron deficiency anemia bloating and symptoms of GERD.  He was found to have 10 mm tubular adenoma with low-grade dysplasia.  This polyp was hot snare after elevating with aloe view.  He is returning for the site to be examined to make sure there is no residual polyp.  He is now on PPI double dose and he still has regurgitation heartburn and mild intermittent epigastric pain.  He has not had emesis.  He denies melena.  He is watching his diet and trying to lose weight.  He says he has dropped from 300 pounds down to 250 pounds. Last aspirin and Xarelto dose was 3 days ago.  Past Medical History:  Diagnosis Date  . A-fib ([place])   . Anemia    iron def anemia  . Arthritis   . Asthma   . Coronary artery disease   . Dysrhythmia    in and out Afib  . GERD (gastroesophageal reflux disease)   . Headache   . High cholesterol   . History of DVT (deep vein thrombosis)    a. left leg following ablation for SVT  . Hypertension   . Persistent atrial fibrillation ([place])    a. Dx 08/2012, xarelto initiated; b. 09/2012 Tikosyn initiated -> DCCV -> Sinus c. PVI 03/2013 d. PVI 03/2014  . Sleep apnea    USES  CPAP   . Type 2 diabetes mellitus without complication, without long-term current use of insulin ([place]) 02/16/2018  . Wolff-Parkinson-White (WPW) syndrome    a. s/p RFCA 2001 by Dr Caryl Comes, pt reports complicated by post procedure DVT    Past Surgical History:  Procedure Laterality Date  . ATRIAL FIBRILLATION ABLATION  03/23/2013   PVI by DR Rayann Heman for afib  . ATRIAL FIBRILLATION ABLATION N/A 03/23/2013   Procedure: ATRIAL FIBRILLATION ABLATION;  Surgeon: Coralyn Mark, MD;  Location: [facility] CATH LAB;  Service: Cardiovascular;  Laterality: N/A;  . ATRIAL FIBRILLATION ABLATION N/A 03/27/2014   PVI  Dr Rayann Heman  . BACK SURGERY    . CARDIAC CATHETERIZATION N/A 02/03/2016   Procedure: Left Heart Cath and Coronary Angiography;  Surgeon: Nelva Bush, MD;  Location: [place] CV LAB;  Service: Cardiovascular;  Laterality: N/A;  . CARDIAC CATHETERIZATION N/A 02/03/2016   Procedure: Intravascular Pressure Wire/FFR Study;  Surgeon: Nelva Bush, MD;  Location: [place] CV LAB;  Service: Cardiovascular;  Laterality: N/A;  . CARDIAC CATHETERIZATION N/A 02/03/2016   Procedure: Coronary Stent Intervention;  Surgeon: Nelva Bush, MD;  Location: [place] CV LAB;  Service: Cardiovascular;  Laterality: N/A;  . CARDIAC SURGERY    . CARDIOVERSION N/A 08/25/2012   Procedure: CARDIOVERSION;  Surgeon: Thayer Headings, MD;  Location: [facility] ENDOSCOPY;  Service: Cardiovascular;  Laterality: N/A;  . CARDIOVERSION N/A 09/06/2012   Procedure: CARDIOVERSION/Bedside;  Surgeon: Carlena Bjornstad, MD;  Location: [place];  Service: Cardiovascular;  Laterality: N/A;  . CARDIOVERSION N/A 01/12/2014   Procedure: CARDIOVERSION;  Surgeon: Pixie Casino, MD;  Location: [facility] ENDOSCOPY;  Service: Cardiovascular;  Laterality: N/A;  . CARDIOVERSION N/A 01/30/2016   Procedure: CARDIOVERSION;  Surgeon: Sueanne Margarita, MD;  Location: [facility] ENDOSCOPY;  Service: Cardiovascular;  Laterality: N/A;  . CARDIOVERSION N/A 04/14/2016   Procedure: CARDIOVERSION;  Surgeon: Pixie Casino, MD;  Location: [place];  Service: Cardiovascular;  Laterality: N/A;  .  COLONOSCOPY N/A 12/04/2015   Procedure: COLONOSCOPY;  Surgeon: Rogene Houston, MD;  [facility];  Service: Endoscopy;  Laterality: N/A;  730  . COLONOSCOPY WITH PROPOFOL N/A 04/28/2019   Procedure: COLONOSCOPY WITH PROPOFOL;  Surgeon: Rogene Houston, MD;  [facility];  Service: Endoscopy;  Laterality: N/A;  . CORONARY ANGIOPLASTY    . CORONARY ANGIOPLASTY WITH STENT PLACEMENT    . ELECTROPHYSIOLOGIC STUDY N/A 02/18/2016   Procedure: Atrial Fibrillation  Ablation;  Surgeon: Thompson Grayer, MD;  Location: [place] CV LAB;  Service: Cardiovascular;  Laterality: N/A;  . ESOPHAGOGASTRODUODENOSCOPY (EGD) WITH PROPOFOL N/A 04/28/2019   Procedure: ESOPHAGOGASTRODUODENOSCOPY (EGD) WITH PROPOFOL;  Surgeon: Rogene Houston, MD;  [facility];  Service: Endoscopy;  Laterality: N/A;  7:30  . implantable loop recorder removal  03/01/2019   MDT Reveal LINQ removed in office  . INGUINAL HERNIA REPAIR Bilateral 06/04/2017   Procedure: OPEN BILATERAL INGUINAL HERNIA REPAIR;  Surgeon: Judeth Horn, MD;  Location: [place];  Service: General;  Laterality: Bilateral;  . LOOP RECORDER IMPLANT N/A 06/29/2014   Procedure: LOOP RECORDER IMPLANT;  Surgeon: Thompson Grayer, MD;  Location: [facility] CATH LAB;  Service: Cardiovascular;  Laterality: N/A;  . NOSE SURGERY     sleep apnea surgery  . PERIPHERAL VASCULAR CATHETERIZATION N/A 03/13/2016   Procedure: Thrombin Injection;  Surgeon: Serafina Mitchell, MD;  Location: [place] CV LAB;  Service: Cardiovascular;  Laterality: N/A;  . POLYPECTOMY  04/28/2019   Procedure: POLYPECTOMY;  Surgeon: Rogene Houston, MD;  [facility];  Service: Endoscopy;;  duodenum;splenic flexure;  . STOMACH SURGERY     reflux, fundoplication  . TEE WITHOUT CARDIOVERSION N/A 08/25/2012   Procedure: TRANSESOPHAGEAL ECHOCARDIOGRAM (TEE);  Surgeon: Thayer Headings, MD;  Location: [place];  Service: Cardiovascular;  Laterality: N/A;  . TEE WITHOUT CARDIOVERSION  03/23/2013   DR ROSS  . TEE WITHOUT CARDIOVERSION N/A 03/23/2013   Procedure: TRANSESOPHAGEAL ECHOCARDIOGRAM (TEE);  Surgeon: Fay Records, MD;  Location: [facility] ENDOSCOPY;  Service: Cardiovascular;  Laterality: N/A;  . TEE WITHOUT CARDIOVERSION N/A 03/26/2014   Procedure: TRANSESOPHAGEAL ECHOCARDIOGRAM (TEE);  Surgeon: Josue Hector, MD;  Location: [facility] ENDOSCOPY;  Service: Cardiovascular;  Laterality: N/A;  . TONSILLECTOMY      Family History  Problem Relation Age of Onset   . CAD Father 43  . Diabetes Father   . Heart attack Father   . Transient ischemic attack Father   . Vascular Disease Father   . Diabetes Mother   . Anemia Mother   . Gout Mother   . Aneurysm Maternal Grandmother   . Stroke Maternal Grandfather   . Diabetes Paternal Grandmother   . Stroke Paternal Grandfather   . Diabetes Brother    Social History:  reports that he quit smoking about 9 years ago. His smoking use included cigarettes. He started smoking about 16 years ago. He has a 7.00 pack-year smoking history. He has never used smokeless tobacco. He reports that he does not drink alcohol and does not use drugs.  Allergies: No Known Allergies  Medications Prior to Admission  Medication Sig Dispense Refill  . albuterol (VENTOLIN HFA) 108 (90 Base) MCG/ACT inhaler Inhale 2 puffs into the lungs every 6 (six) hours as needed for wheezing or shortness of breath. 8 g 0  . aspirin EC 81 MG tablet Take 81 mg by mouth daily. Swallow whole.    Marland Kitchen atorvastatin (LIPITOR) 10 MG tablet Take 1 tablet (  10 mg total) by mouth daily. 30 tablet 0  . blood glucose meter kit and supplies Dispense based on patient and insurance preference. Use to check blood sugar once a day(FOR ICD-10 E10.9, E11.9). 1 each 0  . Cholecalciferol (VITAMIN D) 2000 UNITS CAPS Take 4,000 Units by mouth daily.     Marland Kitchen dofetilide (TIKOSYN) 500 MCG capsule TAKE 1 CAPSULE BY MOUTH  TWICE DAILY (Patient taking differently: Take 500 mcg by mouth 2 (two) times daily. ) 180 capsule 0  . enalapril (VASOTEC) 10 MG tablet Take 1 tablet (10 mg total) by mouth 2 (two) times daily. 180 tablet 3  . famotidine (PEPCID) 20 MG tablet TAKE 1 TABLET BY MOUTH AT BEDTIME (Patient taking differently: Take 20 mg by mouth at bedtime. ) 30 tablet 0  . Ferrous Sulfate (IRON) 325 (65 Fe) MG TABS Take 1 tablet (325 mg total) by mouth daily. 30 tablet 1  . fluticasone (FLONASE) 50 MCG/ACT nasal spray Place 1 spray into both nostrils daily as needed for allergies.      . Fluticasone-Salmeterol (ADVAIR DISKUS) 250-50 MCG/DOSE AEPB Inhale 1 puff into the lungs every 12 (twelve) hours. 60 each 0  . furosemide (LASIX) 40 MG tablet Take 1 tablet (40 mg total) by mouth daily. 90 tablet 0  . hydrOXYzine (VISTARIL) 25 MG capsule Take 2 capsules (50 mg total) by mouth 3 (three) times daily as needed for anxiety. 30 capsule 0  . loratadine (CLARITIN) 10 MG tablet Take 10 mg by mouth daily as needed for allergies.     Marland Kitchen MAGNESIUM PO Take 500 mg by mouth daily.     . metFORMIN (GLUCOPHAGE) 1000 MG tablet Take 1 tablet (1,000 mg total) by mouth 2 (two) times daily with a meal. 180 tablet 1  . metoprolol tartrate (LOPRESSOR) 50 MG tablet Take 50 mg by mouth daily.    Marland Kitchen oxybutynin (DITROPAN) 5 MG tablet TAKE ONE-HALF TABLET (2.59m) BY  MOUTH TWO TIMES DAILY AS  NEEDED FOR BLADDER SPASMS (Patient taking differently: Take 2.5 mg by mouth 2 (two) times daily as needed for bladder spasms. TAKE ONE-HALF TABLET (2.516m BY  MOUTH TWO TIMES DAILY AS  NEEDED FOR BLADDER SPASMS) 30 tablet 5  . pantoprazole (PROTONIX) 40 MG tablet Take 1 tablet (40 mg total) by mouth 2 (two) times daily before a meal. 180 tablet 1  . potassium chloride SA (KLOR-CON) 20 MEQ tablet TAKE 1 TABLET (2039mBY MOUTH  EVERY DAY (Patient taking differently: Take 20 mEq by mouth daily. ) 90 tablet 1  . vitamin B-12 (CYANOCOBALAMIN) 500 MCG tablet Take 500 mcg by mouth daily.    . XAlveda Reasons MG TABS tablet TAKE 1 TABLET BY MOUTH ONCE DAILY WITH SUPPER (Patient taking differently: Take 20 mg by mouth daily with supper. ) 30 tablet 6  . azelastine (ASTELIN) 0.1 % nasal spray Place 2 sprays into both nostrils 2 (two) times daily. (Patient not taking: Reported on 02/26/2020) 30 mL 12  . nitroGLYCERIN (NITROSTAT) 0.4 MG SL tablet DISSOLVE 1 TABLET UNDER  TONGUE EVERY 5 MINUTES AS  NEEDED FOR CHEST PAIN MAX  OF 3 TABLETS IN 15 MINUTES. CALL 911 IF PAIN PERSISTS (Patient taking differently: Place 0.4 mg under the tongue  every 5 (five) minutes as needed for chest pain. ) 100 tablet 0    Results for orders placed or performed during the hospital encounter of 02/28/20 (from the past 48 hour(s))  Glucose, capillary     Status: Abnormal  Collection Time: 02/28/20  8:30 AM  Result Value Ref Range   Glucose-Capillary 125 (H) 70 - 99 mg/dL    Comment: Glucose reference range applies only to samples taken after fasting for at least 8 hours.   No results found.  Review of Systems  Blood pressure (!) 125/57, temperature 98 F (36.7 C), temperature source Oral, resp. rate 15, SpO2 100 %. Physical Exam HENT:     Mouth/Throat:     Mouth: Mucous membranes are moist.     Pharynx: Oropharynx is clear.  Eyes:     General: No scleral icterus.    Conjunctiva/sclera: Conjunctivae normal.  Cardiovascular:     Rate and Rhythm: Normal rate and regular rhythm.     Heart sounds: Normal heart sounds. No murmur heard.   Pulmonary:     Effort: Pulmonary effort is normal.     Breath sounds: Normal breath sounds.  Abdominal:     Comments: Abdomen is full.  He has small umbilical hernia.  Is completely reducible.  Abdomen is soft with mild midepigastric tenderness.  No organomegaly or masses.  Musculoskeletal:        General: No swelling.     Cervical back: Neck supple.  Skin:    General: Skin is warm and dry.     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  Neurological:     Mental Status: He is alert.      Assessment/Plan  History of duodenal adenoma low-grade dysplasia. Resistant symptoms of GERD despite therapy. Diagnostic and possibly therapeutic esophagogastroduodenoscopy(if residual polyp noted).  Hildred Laser, MD 02/28/2020, 9:51 AM

## 2020-03-01 ENCOUNTER — Other Ambulatory Visit: Payer: Self-pay

## 2020-03-01 LAB — SURGICAL PATHOLOGY

## 2020-03-03 ENCOUNTER — Other Ambulatory Visit: Payer: Self-pay | Admitting: Family Medicine

## 2020-03-05 ENCOUNTER — Encounter (HOSPITAL_COMMUNITY): Payer: Self-pay | Admitting: Internal Medicine

## 2020-03-10 ENCOUNTER — Other Ambulatory Visit: Payer: Self-pay | Admitting: Family Medicine

## 2020-03-11 ENCOUNTER — Other Ambulatory Visit: Payer: Self-pay | Admitting: *Deleted

## 2020-03-11 MED ORDER — ATORVASTATIN CALCIUM 10 MG PO TABS
10.0000 mg | ORAL_TABLET | Freq: Every day | ORAL | 4 refills | Status: DC
Start: 2020-03-11 — End: 2021-01-20

## 2020-03-15 DIAGNOSIS — Z23 Encounter for immunization: Secondary | ICD-10-CM | POA: Diagnosis not present

## 2020-03-18 ENCOUNTER — Other Ambulatory Visit: Payer: Self-pay | Admitting: Internal Medicine

## 2020-03-19 ENCOUNTER — Other Ambulatory Visit: Payer: Self-pay | Admitting: Internal Medicine

## 2020-03-19 NOTE — Telephone Encounter (Signed)
This is a [facility] pt °

## 2020-03-22 ENCOUNTER — Other Ambulatory Visit: Payer: Self-pay | Admitting: Internal Medicine

## 2020-04-02 ENCOUNTER — Other Ambulatory Visit: Payer: Self-pay

## 2020-04-02 ENCOUNTER — Encounter: Payer: Self-pay | Admitting: Family Medicine

## 2020-04-02 ENCOUNTER — Telehealth (INDEPENDENT_AMBULATORY_CARE_PROVIDER_SITE_OTHER): Payer: BC Managed Care – PPO | Admitting: Family Medicine

## 2020-04-02 VITALS — HR 78 | Temp 98.1°F | Resp 16

## 2020-04-02 DIAGNOSIS — J019 Acute sinusitis, unspecified: Secondary | ICD-10-CM

## 2020-04-02 DIAGNOSIS — B9689 Other specified bacterial agents as the cause of diseases classified elsewhere: Secondary | ICD-10-CM

## 2020-04-02 DIAGNOSIS — R059 Cough, unspecified: Secondary | ICD-10-CM

## 2020-04-02 MED ORDER — AMOXICILLIN-POT CLAVULANATE 875-125 MG PO TABS: 1.0000 | ORAL_TABLET | Freq: Two times a day (BID) | ORAL | 0 refills | Status: DC

## 2020-04-02 MED ORDER — BENZONATATE 100 MG PO CAPS: 100.0000 mg | ORAL_CAPSULE | Freq: Two times a day (BID) | ORAL | 0 refills | Status: DC | PRN

## 2020-04-02 NOTE — Progress Notes (Signed)
Patient ID: Austin Woods, male    DOB: 1965-08-05, 54 y.o.   MRN: [id number]   Chief Complaint  Patient presents with  . Cough   Subjective:  CC: cough and congestion   This is a new problem.  Presents today via video visit with a complaint of cough and congestion.  Symptoms started 3 days ago.  Also reports fatigue, sinus pain and pressure and dry nose.  Pertinent negatives include no fever, no chills, no chest pain, no shortness of breath, no ear pain, no abdominal pain.  Has tried over-the-counter sinus medication without relief.  Has no known Covid exposures.  Had a Covid infection in May 2020, has received second dose of Covid vaccine not due for booster yet.  Recommend Covid testing, he will come to the office in person later this morning.   cough and congestion. Started 3 days ago. Taking otc sinus meds. Has not had a covid test since symptoms started. No known exposure to covid.   Virtual Visit via Video Note  I connected with [name] on 04/02/20 at  9:00 AM EST by a video enabled telemedicine application and verified that I am speaking with the correct person using two identifiers.  Location: Patient: home Provider: office   I discussed the limitations of evaluation and management by telemedicine and the availability of in person appointments. The patient expressed understanding and agreed to proceed.  History of Present Illness:    Observations/Objective:   Assessment and Plan:   Follow Up Instructions:    I discussed the assessment and treatment plan with the patient. The patient was provided an opportunity to ask questions and all were answered. The patient agreed with the plan and demonstrated an understanding of the instructions.   The patient was advised to call back or seek an in-person evaluation if the symptoms worsen or if the condition fails to improve as anticipated.  I provided 12 minutes of non-face-to-face time during this  encounter.      Medical History Austin Woods has a past medical history of A-fib ([place]), Anemia, Arthritis, Asthma, Coronary artery disease, Dysrhythmia, GERD (gastroesophageal reflux disease), Headache, High cholesterol, History of DVT (deep vein thrombosis), Hypertension, Persistent atrial fibrillation ([place]), Sleep apnea, Type 2 diabetes mellitus without complication, without long-term current use of insulin ([place]) (02/16/2018), and Wolff-Parkinson-White (WPW) syndrome.   Outpatient Encounter Medications as of 04/02/2020  Medication Sig  . albuterol (VENTOLIN HFA) 108 (90 Base) MCG/ACT inhaler Inhale 2 puffs into the lungs every 6 (six) hours as needed for wheezing or shortness of breath.  Marland Kitchen amoxicillin-clavulanate (AUGMENTIN) 875-125 MG tablet Take 1 tablet by mouth 2 (two) times daily.  Marland Kitchen aspirin EC 81 MG tablet Take 1 tablet (81 mg total) by mouth daily. Swallow whole.  Marland Kitchen atorvastatin (LIPITOR) 10 MG tablet Take 1 tablet (10 mg total) by mouth daily.  . benzonatate (TESSALON) 100 MG capsule Take 1 capsule (100 mg total) by mouth 2 (two) times daily as needed for cough.  . blood glucose meter kit and supplies Dispense based on patient and insurance preference. Use to check blood sugar once a day(FOR ICD-10 E10.9, E11.9).  Marland Kitchen Cholecalciferol (VITAMIN D) 2000 UNITS CAPS Take 4,000 Units by mouth daily.   Marland Kitchen dofetilide (TIKOSYN) 500 MCG capsule TAKE 1 CAPSULE BY MOUTH  TWICE DAILY  . enalapril (VASOTEC) 10 MG tablet Take 1 tablet (10 mg total) by mouth 2 (two) times daily.  Marland Kitchen esomeprazole (NEXIUM) 40 MG capsule Take 1 capsule (40  mg total) by mouth 2 (two) times daily before a meal.  . Ferrous Sulfate (IRON) 325 (65 Fe) MG TABS Take 1 tablet (325 mg total) by mouth daily.  . fluticasone (FLONASE) 50 MCG/ACT nasal spray Place 1 spray into both nostrils daily as needed for allergies.   . Fluticasone-Salmeterol (ADVAIR DISKUS) 250-50 MCG/DOSE AEPB Inhale 1 puff into the lungs every 12 (twelve) hours.  .  furosemide (LASIX) 40 MG tablet TAKE 1 TABLET BY MOUTH  DAILY  . hydrOXYzine (VISTARIL) 25 MG capsule Take 2 capsules (50 mg total) by mouth 3 (three) times daily as needed for anxiety.  Marland Kitchen loratadine (CLARITIN) 10 MG tablet Take 10 mg by mouth daily as needed for allergies.   Marland Kitchen MAGNESIUM PO Take 500 mg by mouth daily.   . metFORMIN (GLUCOPHAGE) 1000 MG tablet Take 1 tablet (1,000 mg total) by mouth 2 (two) times daily with a meal.  . metoprolol tartrate (LOPRESSOR) 50 MG tablet Take 50 mg by mouth daily.  . nitroGLYCERIN (NITROSTAT) 0.4 MG SL tablet Place 1 tablet (0.4 mg total) under the tongue every 5 (five) minutes as needed for chest pain. Please schedule overdue appointment for future refills. 1st attempt. Thank you  . oxybutynin (DITROPAN) 5 MG tablet TAKE ONE-HALF TABLET (2.80m) BY  MOUTH TWO TIMES DAILY AS  NEEDED FOR BLADDER SPASMS (Patient taking differently: Take 2.5 mg by mouth 2 (two) times daily as needed for bladder spasms. TAKE ONE-HALF TABLET (2.513m BY  MOUTH TWO TIMES DAILY AS  NEEDED FOR BLADDER SPASMS)  . potassium chloride SA (KLOR-CON) 20 MEQ tablet TAKE 1 TABLET BY MOUTH  DAILY  . rivaroxaban (XARELTO) 20 MG TABS tablet Take 1 tablet (20 mg total) by mouth daily with supper.  . vitamin B-12 (CYANOCOBALAMIN) 500 MCG tablet Take 500 mcg by mouth daily.   No facility-administered encounter medications on file as of 04/02/2020.     Review of Systems  Constitutional: Positive for fatigue. Negative for chills and fever.  HENT: Positive for congestion, sinus pressure and sinus pain. Negative for ear pain.   Respiratory: Positive for cough. Negative for shortness of breath.   Cardiovascular: Negative for chest pain.  Gastrointestinal: Negative for abdominal pain.     Vitals Pulse 78   Temp 98.1 F (36.7 C)   Resp 16   SpO2 98%   Objective:   Physical Exam Vitals reviewed.  Constitutional:      General: He is not in acute distress.    Appearance: Normal  appearance.  HENT:     Nose: Nose normal.     Mouth/Throat:     Mouth: Mucous membranes are moist.     Pharynx: Oropharynx is clear.  Cardiovascular:     Rate and Rhythm: Normal rate and regular rhythm.     Heart sounds: Normal heart sounds.  Pulmonary:     Effort: Pulmonary effort is normal.     Breath sounds: Normal breath sounds.  Skin:    General: Skin is warm and dry.  Neurological:     Mental Status: He is alert. Mental status is at baseline.  Psychiatric:        Behavior: Behavior normal.      Assessment and Plan   1. Acute bacterial rhinosinusitis - amoxicillin-clavulanate (AUGMENTIN) 875-125 MG tablet; Take 1 tablet by mouth 2 (two) times daily.  Dispense: 20 tablet; Refill: 0 - Novel Coronavirus, NAA ([facility])  2. Cough - benzonatate (TESSALON) 100 MG capsule; Take 1 capsule (100 mg total) by  mouth 2 (two) times daily as needed for cough.  Dispense: 20 capsule; Refill: 0 - Novel Coronavirus, NAA ([facility])   Due to the report of sinus pain and pressure, will treat for acute rhinosinusitis.  He will come to the office later this morning for Covid testing, and physical exam.  He also would like something for cough.  Presented in person for assessment and Covid testing. He is going to pickup his medication today. Significant maxillary and frontal pain and pressure. Will continue with original treatment plan.  Covid-19 respiratory warning:  Covid-19 is a virus that causes hypoxia (low oxygen level in blood) in some people. If you develop any changes in your usual breathing pattern: difficulty catching your breath, more short winded with activity or with resting, or anything that concerns you about your breathing, do not hesitate to go to the emergency department immediately for evaluation. Please do not delay to get treatment.    Agrees with plan of care discussed today. Understands warning signs to seek further care: Chest pain, shortness of breath, any significant  change in health status.  If this Covid should be positive, COVID-19 respiratory warning as stated above given. Understands to follow-up if symptoms worsen or do not resolve, will notify once Covid results are available.  He will let us know if he needs [place].  This visit was a combination of a phone visit and an in person visit.  Pecolia Ades, FNP-C 04/02/2020

## 2020-04-04 ENCOUNTER — Other Ambulatory Visit (INDEPENDENT_AMBULATORY_CARE_PROVIDER_SITE_OTHER): Payer: Self-pay

## 2020-04-04 LAB — SARS-COV-2, NAA 2 DAY TAT

## 2020-04-04 LAB — NOVEL CORONAVIRUS, NAA: SARS-CoV-2, NAA: NOT DETECTED

## 2020-04-10 ENCOUNTER — Telehealth: Payer: Self-pay | Admitting: Family Medicine

## 2020-04-10 ENCOUNTER — Other Ambulatory Visit: Payer: Self-pay | Admitting: Family Medicine

## 2020-04-10 DIAGNOSIS — J019 Acute sinusitis, unspecified: Secondary | ICD-10-CM

## 2020-04-10 DIAGNOSIS — B9689 Other specified bacterial agents as the cause of diseases classified elsewhere: Secondary | ICD-10-CM

## 2020-04-10 MED ORDER — DOXYCYCLINE HYCLATE 100 MG PO TABS: 100.0000 mg | ORAL_TABLET | Freq: Two times a day (BID) | ORAL | 0 refills | Status: DC

## 2020-04-10 NOTE — Telephone Encounter (Signed)
Patient notified of abx

## 2020-04-10 NOTE — Telephone Encounter (Signed)
Sent another antibiotic to [facility]........Marland Kitchen

## 2020-04-10 NOTE — Progress Notes (Signed)
Recurrent sinusitis

## 2020-04-10 NOTE — Telephone Encounter (Signed)
Patient reports symptoms have not gotten any better since being seen. He is experiencing congestion and just feels that his head is stopped up. Tomorrow he will finish abx. Ok to leave message as he is at work.

## 2020-04-10 NOTE — Telephone Encounter (Signed)
Patient was seen 12/28 and still having cough and congestion and requesting another around of antibiotics called into [facility] please advise

## 2020-04-10 NOTE — Telephone Encounter (Signed)
Left message to return call to get more information 

## 2020-04-24 ENCOUNTER — Other Ambulatory Visit (INDEPENDENT_AMBULATORY_CARE_PROVIDER_SITE_OTHER): Payer: Self-pay | Admitting: Internal Medicine

## 2020-04-24 NOTE — Telephone Encounter (Signed)
Will need to follow up in clinic for further refills

## 2020-04-24 NOTE — Telephone Encounter (Signed)
Last seen 02/05/2020 by Thayer Headings for [place].

## 2020-05-09 ENCOUNTER — Other Ambulatory Visit (INDEPENDENT_AMBULATORY_CARE_PROVIDER_SITE_OTHER): Payer: Self-pay | Admitting: Gastroenterology

## 2020-05-10 ENCOUNTER — Encounter: Payer: Self-pay | Admitting: Internal Medicine

## 2020-05-10 ENCOUNTER — Ambulatory Visit (INDEPENDENT_AMBULATORY_CARE_PROVIDER_SITE_OTHER): Payer: BC Managed Care – PPO | Admitting: Internal Medicine

## 2020-05-10 ENCOUNTER — Encounter: Payer: Self-pay | Admitting: *Deleted

## 2020-05-10 VITALS — BP 146/86 | HR 65 | Ht 73.0 in | Wt 260.0 lb

## 2020-05-10 DIAGNOSIS — I4819 Other persistent atrial fibrillation: Secondary | ICD-10-CM | POA: Diagnosis not present

## 2020-05-10 DIAGNOSIS — R0602 Shortness of breath: Secondary | ICD-10-CM

## 2020-05-10 DIAGNOSIS — G4733 Obstructive sleep apnea (adult) (pediatric): Secondary | ICD-10-CM

## 2020-05-10 DIAGNOSIS — R0789 Other chest pain: Secondary | ICD-10-CM

## 2020-05-10 DIAGNOSIS — Z5181 Encounter for therapeutic drug level monitoring: Secondary | ICD-10-CM

## 2020-05-10 DIAGNOSIS — R079 Chest pain, unspecified: Secondary | ICD-10-CM | POA: Diagnosis not present

## 2020-05-10 DIAGNOSIS — I25118 Atherosclerotic heart disease of native coronary artery with other forms of angina pectoris: Secondary | ICD-10-CM | POA: Diagnosis not present

## 2020-05-10 DIAGNOSIS — Z79899 Other long term (current) drug therapy: Secondary | ICD-10-CM

## 2020-05-10 NOTE — Patient Instructions (Signed)
Medication Instructions:   Stop Aspirin.   Continue all other medications.    Labwork: none  Testing/Procedures:  Your physician has requested that you have an echocardiogram. Echocardiography is a painless test that uses sound waves to create images of your heart. It provides your doctor with information about the size and shape of your heart and how well your heart's chambers and valves are working. This procedure takes approximately one hour. There are no restrictions for this procedure.  Your physician has requested that you have a lexiscan myoview. For further information please visit [email or web address]. Please follow instruction sheet, as given.  Office will contact with results via phone or letter.    Follow-Up:  Katina Dung, NP - 2 months  6 months - [place]   Any Other Special Instructions Will Be Listed Below (If Applicable).  If you need a refill on your cardiac medications before your next appointment, please call your pharmacy.

## 2020-05-10 NOTE — Progress Notes (Signed)
PCP: Erven Colla, DO   Primary EP: Dr Garlan Fillers is a 55 y.o. male who presents today for routine electrophysiology followup.  Since last being seen in our clinic, the patient reports doing reasonably well.   He does not feel as if he is having afib.  + fatigue and SOB.  + chest tightness. Uses CPAP but does not feel well rested. Today, he denies symptoms of palpitations,  lower extremity edema, dizziness, presyncope, or syncope.  The patient is otherwise without complaint today.   Past Medical History:  Diagnosis Date  . A-fib ([place])   . Anemia    iron def anemia  . Arthritis   . Asthma   . Coronary artery disease   . Dysrhythmia    in and out Afib  . GERD (gastroesophageal reflux disease)   . Headache   . High cholesterol   . History of DVT (deep vein thrombosis)    a. left leg following ablation for SVT  . Hypertension   . Persistent atrial fibrillation ([place])    a. Dx 08/2012, xarelto initiated; b. 09/2012 Tikosyn initiated -> DCCV -> Sinus c. PVI 03/2013 d. PVI 03/2014  . Sleep apnea    USES  CPAP   . Type 2 diabetes mellitus without complication, without long-term current use of insulin ([place]) 02/16/2018  . Wolff-Parkinson-White (WPW) syndrome    a. s/p RFCA 2001 by Dr Caryl Comes, pt reports complicated by post procedure DVT   Past Surgical History:  Procedure Laterality Date  . ATRIAL FIBRILLATION ABLATION  03/23/2013   PVI by DR Rayann Heman for afib  . ATRIAL FIBRILLATION ABLATION N/A 03/23/2013   Procedure: ATRIAL FIBRILLATION ABLATION;  Surgeon: Coralyn Mark, MD;  Location: [place] CATH LAB;  Service: Cardiovascular;  Laterality: N/A;  . ATRIAL FIBRILLATION ABLATION N/A 03/27/2014   PVI Dr Rayann Heman  . BACK SURGERY    . BIOPSY  02/28/2020   Procedure: BIOPSY;  Surgeon: Rogene Houston, MD;  [facility];  Service: Endoscopy;;  antral(pre-pyloric)   . CARDIAC CATHETERIZATION N/A 02/03/2016   Procedure: Left Heart Cath and Coronary Angiography;   Surgeon: Nelva Bush, MD;  Location: [place] CV LAB;  Service: Cardiovascular;  Laterality: N/A;  . CARDIAC CATHETERIZATION N/A 02/03/2016   Procedure: Intravascular Pressure Wire/FFR Study;  Surgeon: Nelva Bush, MD;  Location: [place] CV LAB;  Service: Cardiovascular;  Laterality: N/A;  . CARDIAC CATHETERIZATION N/A 02/03/2016   Procedure: Coronary Stent Intervention;  Surgeon: Nelva Bush, MD;  Location: [place] CV LAB;  Service: Cardiovascular;  Laterality: N/A;  . CARDIAC SURGERY    . CARDIOVERSION N/A 08/25/2012   Procedure: CARDIOVERSION;  Surgeon: Thayer Headings, MD;  Location: [facility] ENDOSCOPY;  Service: Cardiovascular;  Laterality: N/A;  . CARDIOVERSION N/A 09/06/2012   Procedure: CARDIOVERSION/Bedside;  Surgeon: Carlena Bjornstad, MD;  Location: [place];  Service: Cardiovascular;  Laterality: N/A;  . CARDIOVERSION N/A 01/12/2014   Procedure: CARDIOVERSION;  Surgeon: Pixie Casino, MD;  Location: [facility] ENDOSCOPY;  Service: Cardiovascular;  Laterality: N/A;  . CARDIOVERSION N/A 01/30/2016   Procedure: CARDIOVERSION;  Surgeon: Sueanne Margarita, MD;  Location: [facility] ENDOSCOPY;  Service: Cardiovascular;  Laterality: N/A;  . CARDIOVERSION N/A 04/14/2016   Procedure: CARDIOVERSION;  Surgeon: Pixie Casino, MD;  Location: [facility] ENDOSCOPY;  Service: Cardiovascular;  Laterality: N/A;  . COLONOSCOPY N/A 12/04/2015   Procedure: COLONOSCOPY;  Surgeon: Rogene Houston, MD;  [facility];  Service: Endoscopy;  Laterality:  N/A;  730  . COLONOSCOPY WITH PROPOFOL N/A 04/28/2019   Procedure: COLONOSCOPY WITH PROPOFOL;  Surgeon: Rogene Houston, MD;  [facility];  Service: Endoscopy;  Laterality: N/A;  . CORONARY ANGIOPLASTY    . CORONARY ANGIOPLASTY WITH STENT PLACEMENT    . ELECTROPHYSIOLOGIC STUDY N/A 02/18/2016   Procedure: Atrial Fibrillation Ablation;  Surgeon: Thompson Grayer, MD;  Location: [place] CV LAB;  Service: Cardiovascular;  Laterality: N/A;  .  ESOPHAGOGASTRODUODENOSCOPY (EGD) WITH PROPOFOL N/A 04/28/2019   Procedure: ESOPHAGOGASTRODUODENOSCOPY (EGD) WITH PROPOFOL;  Surgeon: Rogene Houston, MD;  [facility];  Service: Endoscopy;  Laterality: N/A;  7:30  . ESOPHAGOGASTRODUODENOSCOPY (EGD) WITH PROPOFOL N/A 02/28/2020   Procedure: ESOPHAGOGASTRODUODENOSCOPY (EGD) WITH PROPOFOL;  Surgeon: Rogene Houston, MD;  [facility];  Service: Endoscopy;  Laterality: N/A;  1245  . implantable loop recorder removal  03/01/2019   MDT Reveal LINQ removed in office  . INGUINAL HERNIA REPAIR Bilateral 06/04/2017   Procedure: OPEN BILATERAL INGUINAL HERNIA REPAIR;  Surgeon: Judeth Horn, MD;  Location: [place];  Service: General;  Laterality: Bilateral;  . LOOP RECORDER IMPLANT N/A 06/29/2014   Procedure: LOOP RECORDER IMPLANT;  Surgeon: Thompson Grayer, MD;  Location: [facility] CATH LAB;  Service: Cardiovascular;  Laterality: N/A;  . NOSE SURGERY     sleep apnea surgery  . PERIPHERAL VASCULAR CATHETERIZATION N/A 03/13/2016   Procedure: Thrombin Injection;  Surgeon: Serafina Mitchell, MD;  Location: [place] CV LAB;  Service: Cardiovascular;  Laterality: N/A;  . POLYPECTOMY  04/28/2019   Procedure: POLYPECTOMY;  Surgeon: Rogene Houston, MD;  [facility];  Service: Endoscopy;;  duodenum;splenic flexure;  . STOMACH SURGERY     reflux, fundoplication  . TEE WITHOUT CARDIOVERSION N/A 08/25/2012   Procedure: TRANSESOPHAGEAL ECHOCARDIOGRAM (TEE);  Surgeon: Thayer Headings, MD;  Location: [place];  Service: Cardiovascular;  Laterality: N/A;  . TEE WITHOUT CARDIOVERSION  03/23/2013   DR ROSS  . TEE WITHOUT CARDIOVERSION N/A 03/23/2013   Procedure: TRANSESOPHAGEAL ECHOCARDIOGRAM (TEE);  Surgeon: Fay Records, MD;  Location: [facility] ENDOSCOPY;  Service: Cardiovascular;  Laterality: N/A;  . TEE WITHOUT CARDIOVERSION N/A 03/26/2014   Procedure: TRANSESOPHAGEAL ECHOCARDIOGRAM (TEE);  Surgeon: Josue Hector, MD;  Location: [facility] ENDOSCOPY;   Service: Cardiovascular;  Laterality: N/A;  . TONSILLECTOMY      ROS- all systems are reviewed and negatives except as per HPI above  Current Outpatient Medications  Medication Sig Dispense Refill  . albuterol (VENTOLIN HFA) 108 (90 Base) MCG/ACT inhaler Inhale 2 puffs into the lungs every 6 (six) hours as needed for wheezing or shortness of breath. 8 g 0  . aspirin EC 81 MG tablet Take 1 tablet (81 mg total) by mouth daily. Swallow whole. 30 tablet 11  . atorvastatin (LIPITOR) 10 MG tablet Take 1 tablet (10 mg total) by mouth daily. 30 tablet 4  . benzonatate (TESSALON) 100 MG capsule Take 1 capsule (100 mg total) by mouth 2 (two) times daily as needed for cough. 20 capsule 0  . blood glucose meter kit and supplies Dispense based on patient and insurance preference. Use to check blood sugar once a day(FOR ICD-10 E10.9, E11.9). 1 each 0  . Cholecalciferol (VITAMIN D) 2000 UNITS CAPS Take 4,000 Units by mouth daily.     Marland Kitchen dofetilide (TIKOSYN) 500 MCG capsule TAKE 1 CAPSULE BY MOUTH  TWICE DAILY 15 capsule 0  . doxycycline (VIBRA-TABS) 100 MG tablet Take 1 tablet (100 mg total)  by mouth 2 (two) times daily. 20 tablet 0  . enalapril (VASOTEC) 10 MG tablet Take 1 tablet (10 mg total) by mouth 2 (two) times daily. 180 tablet 3  . esomeprazole (NEXIUM) 40 MG capsule TAKE 1 CAPSULE BY MOUTH TWICE DAILY BEFORE A MEAL 60 capsule 5  . Ferrous Sulfate (IRON) 325 (65 Fe) MG TABS Take 1 tablet (325 mg total) by mouth daily. 30 tablet 1  . fluticasone (FLONASE) 50 MCG/ACT nasal spray Place 1 spray into both nostrils daily as needed for allergies.     . Fluticasone-Salmeterol (ADVAIR DISKUS) 250-50 MCG/DOSE AEPB Inhale 1 puff into the lungs every 12 (twelve) hours. 60 each 0  . furosemide (LASIX) 40 MG tablet TAKE 1 TABLET BY MOUTH  DAILY 90 tablet 0  . hydrOXYzine (VISTARIL) 25 MG capsule Take 2 capsules (50 mg total) by mouth 3 (three) times daily as needed for anxiety. 30 capsule 0  . loratadine  (CLARITIN) 10 MG tablet Take 10 mg by mouth daily as needed for allergies.     Marland Kitchen MAGNESIUM PO Take 500 mg by mouth daily.     . metFORMIN (GLUCOPHAGE) 1000 MG tablet Take 1 tablet (1,000 mg total) by mouth 2 (two) times daily with a meal. 180 tablet 1  . metoprolol tartrate (LOPRESSOR) 50 MG tablet Take 50 mg by mouth daily.    . nitroGLYCERIN (NITROSTAT) 0.4 MG SL tablet Place 1 tablet (0.4 mg total) under the tongue every 5 (five) minutes as needed for chest pain. Please schedule overdue appointment for future refills. 1st attempt. Thank you 25 tablet 0  . oxybutynin (DITROPAN) 5 MG tablet TAKE ONE-HALF TABLET (2.31m) BY  MOUTH TWO TIMES DAILY AS  NEEDED FOR BLADDER SPASMS (Patient taking differently: Take 2.5 mg by mouth 2 (two) times daily as needed for bladder spasms. TAKE ONE-HALF TABLET (2.561m BY  MOUTH TWO TIMES DAILY AS  NEEDED FOR BLADDER SPASMS) 30 tablet 5  . potassium chloride SA (KLOR-CON) 20 MEQ tablet TAKE 1 TABLET BY MOUTH  DAILY 90 tablet 0  . rivaroxaban (XARELTO) 20 MG TABS tablet Take 1 tablet (20 mg total) by mouth daily with supper.    . vitamin B-12 (CYANOCOBALAMIN) 500 MCG tablet Take 500 mcg by mouth daily.     No current facility-administered medications for this visit.    Physical Exam: Vitals:   05/10/20 1047  BP: (!) 146/86  Pulse: 65  SpO2: 97%  Weight: 260 lb (117.9 kg)  Height: 6' 1"  (1.854 m)    GEN- The patient is overweight appearing, alert and oriented x 3 today.   Head- normocephalic, atraumatic Eyes-  Sclera clear, conjunctiva pink Ears- hearing intact Oropharynx- clear Lungs- Clear to ausculation bilaterally, normal work of breathing Heart- Regular rate and rhythm, no murmurs, rubs or gallops, PMI not laterally displaced GI- soft, NT, ND, + BS Extremities- no clubbing, cyanosis, or edema  Wt Readings from Last 3 Encounters:  05/10/20 260 lb (117.9 kg)  02/27/20 253 lb (114.8 kg)  02/05/20 260 lb 9.6 oz (118.2 kg)    EKG tracing ordered  today is personally reviewed and shows sinus with PACs, qt 480 msec  Assessment and Plan:  1. Persistent afib Doing well with tikosyn Importance of close follow-up to avoid toxicity was discussed today Labs 10/21 reviewed  2. Obesity Body mass index is 34.3 kg/m. Lifestyle modification advised  3. HTN Stable No change required today  4. OSA Compliance with CPAP advised I have advised that he follow  closely with his prescribing physician as he may benefit from optimization  5. CAD Will obtain echo and myoview to evaluate SOB and chest discomfort  Risks, benefits and potential toxicities for medications prescribed and/or refilled reviewed with patient today.   Return to see Katina Dung in 2 months I will see in 6 months  Thompson Grayer MD, [facility] 05/10/2020 11:14 AM

## 2020-05-17 ENCOUNTER — Ambulatory Visit (HOSPITAL_COMMUNITY)
Admission: RE | Admit: 2020-05-17 | Discharge: 2020-05-17 | Disposition: A | Discharge status: Home / Self Care | Payer: BC Managed Care – PPO | Source: Ambulatory Visit | Attending: Internal Medicine | Admitting: Internal Medicine

## 2020-05-17 ENCOUNTER — Other Ambulatory Visit: Payer: Self-pay

## 2020-05-17 ENCOUNTER — Encounter (HOSPITAL_COMMUNITY)
Admission: RE | Admit: 2020-05-17 | Discharge: 2020-05-17 | Disposition: A | Discharge status: Home / Self Care | Payer: BC Managed Care – PPO | Source: Ambulatory Visit | Attending: Internal Medicine | Admitting: Internal Medicine

## 2020-05-17 DIAGNOSIS — R079 Chest pain, unspecified: Secondary | ICD-10-CM | POA: Diagnosis not present

## 2020-05-17 DIAGNOSIS — R0789 Other chest pain: Secondary | ICD-10-CM | POA: Diagnosis not present

## 2020-05-17 DIAGNOSIS — R0602 Shortness of breath: Secondary | ICD-10-CM | POA: Insufficient documentation

## 2020-05-17 LAB — NM MYOCAR MULTI W/SPECT W/WALL MOTION / EF
LV dias vol: 86 mL (ref 62–150)
LV sys vol: 30 mL
RATE: 0.32
SDS: 0
SRS: 0
SSS: 0
TID: 0.82

## 2020-05-17 MED ORDER — SODIUM CHLORIDE FLUSH 0.9 % IV SOLN
INTRAVENOUS | Status: AC
  Administered 2020-05-17: 10 mL via INTRAVENOUS
  Filled 2020-05-17: qty 10

## 2020-05-17 MED ORDER — REGADENOSON 0.4 MG/5ML IV SOLN
INTRAVENOUS | Status: AC
  Administered 2020-05-17: 0.4 mg via INTRAVENOUS
  Filled 2020-05-17: qty 5

## 2020-05-17 MED ORDER — TECHNETIUM TC 99M TETROFOSMIN IV KIT
10.0000 | PACK | Freq: Once | INTRAVENOUS | Status: AC | PRN
  Administered 2020-05-17: 10.7 via INTRAVENOUS

## 2020-05-17 MED ORDER — TECHNETIUM TC 99M TETROFOSMIN IV KIT
30.0000 | PACK | Freq: Once | INTRAVENOUS | Status: AC | PRN
  Administered 2020-05-17: 31.3 via INTRAVENOUS

## 2020-05-21 ENCOUNTER — Telehealth: Payer: Self-pay | Admitting: Internal Medicine

## 2020-05-21 NOTE — Telephone Encounter (Signed)
The patient has been notified of the result and verbalized understanding.  All questions (if any) were answered. Darrell Jewel, RN 05/21/2020 5:33 PM

## 2020-05-21 NOTE — Telephone Encounter (Signed)
   Pt is returning call to get stress test result  

## 2020-05-25 ENCOUNTER — Other Ambulatory Visit: Payer: Self-pay | Admitting: Family Medicine

## 2020-05-30 ENCOUNTER — Other Ambulatory Visit: Payer: Self-pay | Admitting: Internal Medicine

## 2020-05-30 ENCOUNTER — Other Ambulatory Visit: Payer: Self-pay | Admitting: Family Medicine

## 2020-05-30 DIAGNOSIS — E119 Type 2 diabetes mellitus without complications: Secondary | ICD-10-CM

## 2020-06-05 ENCOUNTER — Ambulatory Visit (INDEPENDENT_AMBULATORY_CARE_PROVIDER_SITE_OTHER): Payer: BC Managed Care – PPO

## 2020-06-05 DIAGNOSIS — R079 Chest pain, unspecified: Secondary | ICD-10-CM | POA: Diagnosis not present

## 2020-06-05 DIAGNOSIS — R0602 Shortness of breath: Secondary | ICD-10-CM

## 2020-06-05 LAB — ECHOCARDIOGRAM COMPLETE
AR max vel: 2.02 cm2
AV Area VTI: 2 cm2
AV Area mean vel: 1.84 cm2
AV Mean grad: 5 mmHg
AV Peak grad: 10.4 mmHg
Ao pk vel: 1.61 m/s
Area-P 1/2: 3.74 cm2
Calc EF: 59.9 %
S' Lateral: 3.01 cm
Single Plane A2C EF: 54.2 %
Single Plane A4C EF: 64.3 %

## 2020-06-17 ENCOUNTER — Other Ambulatory Visit (INDEPENDENT_AMBULATORY_CARE_PROVIDER_SITE_OTHER): Payer: Self-pay | Admitting: Internal Medicine

## 2020-06-17 DIAGNOSIS — D509 Iron deficiency anemia, unspecified: Secondary | ICD-10-CM

## 2020-06-17 NOTE — Telephone Encounter (Signed)
Last seen by Thayer Headings 02/04/2021 for [place]

## 2020-06-18 ENCOUNTER — Telehealth: Payer: BC Managed Care – PPO | Admitting: Physician Assistant

## 2020-06-18 ENCOUNTER — Telehealth: Payer: Self-pay | Admitting: Internal Medicine

## 2020-06-18 DIAGNOSIS — R112 Nausea with vomiting, unspecified: Secondary | ICD-10-CM

## 2020-06-18 DIAGNOSIS — Z79899 Other long term (current) drug therapy: Secondary | ICD-10-CM

## 2020-06-18 NOTE — Telephone Encounter (Signed)
Patient call back to get results.  

## 2020-06-18 NOTE — Progress Notes (Signed)
Based on what you shared with me, I feel your condition warrants further evaluation and I recommend that you be seen for a face to face office visit. In your case I am recommending a follow-up with your primary care provider as we are limited in what we can prescribe for vomiting via e-visit and the current choices (as well as a lot of medications) can interact poorly with your Tikosyn (dofetilide). I recommend you reach out to your primary care provider and/or cardiologist for advice on what to take for your symptoms.    NOTE: If you entered your credit card information for this eVisit, you will not be charged. You may see a "hold" on your card for the $35 but that hold will drop off and you will not have a charge processed.   If you are having a true medical emergency please call 911.      For an urgent face to face visit, [facility] has five urgent care centers for your convenience:     [facility] at  Get Driving Directions [phone] [address], [place] [address] . 10 am - 6pm Monday - Friday    [facility]) Get Driving Directions [phone] [address] [place], [place] [address] . 10 am to 8 pm Monday-Friday . 12 pm to 8 pm [facility] at [facility] Get Driving Directions [phone] [place], [place], [place] [address] . 8 am to 8 pm Monday-Friday . 9 am to 6 pm Saturday . 11 am to 6 pm Sunday     [facility] at [facility] Get Driving Directions  [phone] [address] [place], [place] [address] . 8 am to 8 pm Monday-Friday . 8 am to 4 pm [facility] [place] Get Driving Directions [phone] [address] [place], [place] [address] . 12 pm to 6 pm Monday-Friday      Your e-visit answers were reviewed by a board certified advanced  clinical practitioner to complete your personal care plan.  Thank you for using e-Visits.

## 2020-06-18 NOTE — Telephone Encounter (Signed)
The patient has been notified of the Echo result and verbalized understanding.  All questions (if any) were answered. Darrell Jewel, RN 06/18/2020 1:16 PM

## 2020-07-11 NOTE — Progress Notes (Signed)
Cardiology Office Note  Date: 07/12/2020   ID: Austin Woods, DOB Jan 22, 1966, MRN [id number]  PCP:  Erven Colla, DO  Cardiologist:  No primary care provider on file. Electrophysiologist:  Thompson Grayer, MD   Chief Complaint:   History of Present Illness: Austin Woods is a 55 y.o. male with a history of atrial fibrillation, secondary cardiomyopathy, WPW, HTN, Chronic systolic HF, OSA, DM2, morbid obesity.   Last seen by Dr. Rayann Heman on 05/10/2020 for history of persistent atrial fibrillation, CAD, OSA, chest pain, shortness of breath.  He presented for pain electrophysiology follow-up.  He reported doing reasonably well did not feel as if he was having atrial fibrillation it was positive for fatigue, shortness of breath, chest tightness.  He was using CPAP but did not feel well rested.  He denies any symptoms of palpitations, edema, dizziness, presyncope or syncope.  He was otherwise without complaint.he was doing well on Tikosyn.  Importance of close follow-up to avoid toxicity was discussed.  Hypertension was stable no changes to therapy.  Compliance with CPAP was advised.  Echocardiogram was ordered to evaluate shortness of breath and Myoview ordered for chest discomfort.  He is here for follow-up today after seeing Dr. Rayann Heman who ordered a follow-up echocardiogram and stress test secondary to his complaints of shortness of breath and chest discomfort.  Stress test was negative for ischemia and determined to be low risk.  Echocardiogram Demonstrated EF is 66 5%.  No WMA's.  Diastolic function normal.  No valvular abnormalities.  Today he is basically states he sort of gives out easily.  Has back problems and has had back surgeries in the past.  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  He has a grandson he spends a lot of time with.  He has OSA and has not been totally compliant with therapy.  He was advised by Dr. Rayann Heman to be more compliant with therapy.  He denies any symptoms  from Tikosyn use.  He states Tikosyn is the only thing that has kept his heart and rhythm.  He denies any anginal symptoms, orthostatic symptoms, CVA or TIA-like symptoms, PND, orthopnea, bleeding.  Denies any claudication-like symptoms, DVT or PE-like symptoms, lower extremity edema.   Past Medical History:  Diagnosis Date  . A-fib ([place])   . Anemia    iron def anemia  . Arthritis   . Asthma   . Coronary artery disease   . Dysrhythmia    in and out Afib  . GERD (gastroesophageal reflux disease)   . Headache   . High cholesterol   . History of DVT (deep vein thrombosis)    a. left leg following ablation for SVT  . Hypertension   . Persistent atrial fibrillation ([place])    a. Dx 08/2012, xarelto initiated; b. 09/2012 Tikosyn initiated -> DCCV -> Sinus c. PVI 03/2013 d. PVI 03/2014  . Sleep apnea    USES  CPAP   . Type 2 diabetes mellitus without complication, without long-term current use of insulin ([place]) 02/16/2018  . Wolff-Parkinson-White (WPW) syndrome    resolved s/p RFCA 2001 by Dr Caryl Comes    Past Surgical History:  Procedure Laterality Date  . ATRIAL FIBRILLATION ABLATION  03/23/2013   PVI by DR Rayann Heman for afib  . ATRIAL FIBRILLATION ABLATION N/A 03/23/2013   Procedure: ATRIAL FIBRILLATION ABLATION;  Surgeon: Coralyn Mark, MD;  Location: [place] CATH LAB;  Service: Cardiovascular;  Laterality: N/A;  . ATRIAL FIBRILLATION ABLATION N/A 03/27/2014  PVI Dr Rayann Heman  . BACK SURGERY    . BIOPSY  02/28/2020   Procedure: BIOPSY;  Surgeon: Rogene Houston, MD;  [facility];  Service: Endoscopy;;  antral(pre-pyloric)   . CARDIAC CATHETERIZATION N/A 02/03/2016   Procedure: Left Heart Cath and Coronary Angiography;  Surgeon: Nelva Bush, MD;  Location: [place] CV LAB;  Service: Cardiovascular;  Laterality: N/A;  . CARDIAC CATHETERIZATION N/A 02/03/2016   Procedure: Intravascular Pressure Wire/FFR Study;  Surgeon: Nelva Bush, MD;  Location: [place] CV LAB;   Service: Cardiovascular;  Laterality: N/A;  . CARDIAC CATHETERIZATION N/A 02/03/2016   Procedure: Coronary Stent Intervention;  Surgeon: Nelva Bush, MD;  Location: [place] CV LAB;  Service: Cardiovascular;  Laterality: N/A;  . CARDIAC SURGERY    . CARDIOVERSION N/A 08/25/2012   Procedure: CARDIOVERSION;  Surgeon: Thayer Headings, MD;  Location: [facility] ENDOSCOPY;  Service: Cardiovascular;  Laterality: N/A;  . CARDIOVERSION N/A 09/06/2012   Procedure: CARDIOVERSION/Bedside;  Surgeon: Carlena Bjornstad, MD;  Location: [place];  Service: Cardiovascular;  Laterality: N/A;  . CARDIOVERSION N/A 01/12/2014   Procedure: CARDIOVERSION;  Surgeon: Pixie Casino, MD;  Location: [facility] ENDOSCOPY;  Service: Cardiovascular;  Laterality: N/A;  . CARDIOVERSION N/A 01/30/2016   Procedure: CARDIOVERSION;  Surgeon: Sueanne Margarita, MD;  Location: [facility] ENDOSCOPY;  Service: Cardiovascular;  Laterality: N/A;  . CARDIOVERSION N/A 04/14/2016   Procedure: CARDIOVERSION;  Surgeon: Pixie Casino, MD;  Location: [facility] ENDOSCOPY;  Service: Cardiovascular;  Laterality: N/A;  . COLONOSCOPY N/A 12/04/2015   Procedure: COLONOSCOPY;  Surgeon: Rogene Houston, MD;  [facility];  Service: Endoscopy;  Laterality: N/A;  730  . COLONOSCOPY WITH PROPOFOL N/A 04/28/2019   Procedure: COLONOSCOPY WITH PROPOFOL;  Surgeon: Rogene Houston, MD;  [facility];  Service: Endoscopy;  Laterality: N/A;  . CORONARY ANGIOPLASTY    . CORONARY ANGIOPLASTY WITH STENT PLACEMENT    . ELECTROPHYSIOLOGIC STUDY N/A 02/18/2016   Procedure: Atrial Fibrillation Ablation;  Surgeon: Thompson Grayer, MD;  Location: [place] CV LAB;  Service: Cardiovascular;  Laterality: N/A;  . ESOPHAGOGASTRODUODENOSCOPY (EGD) WITH PROPOFOL N/A 04/28/2019   Procedure: ESOPHAGOGASTRODUODENOSCOPY (EGD) WITH PROPOFOL;  Surgeon: Rogene Houston, MD;  [facility];  Service: Endoscopy;  Laterality: N/A;  7:30  . ESOPHAGOGASTRODUODENOSCOPY (EGD) WITH PROPOFOL  N/A 02/28/2020   Procedure: ESOPHAGOGASTRODUODENOSCOPY (EGD) WITH PROPOFOL;  Surgeon: Rogene Houston, MD;  [facility];  Service: Endoscopy;  Laterality: N/A;  1245  . implantable loop recorder removal  03/01/2019   MDT Reveal LINQ removed in office  . INGUINAL HERNIA REPAIR Bilateral 06/04/2017   Procedure: OPEN BILATERAL INGUINAL HERNIA REPAIR;  Surgeon: Judeth Horn, MD;  Location: [place];  Service: General;  Laterality: Bilateral;  . LOOP RECORDER IMPLANT N/A 06/29/2014   Procedure: LOOP RECORDER IMPLANT;  Surgeon: Thompson Grayer, MD;  Location: [facility] CATH LAB;  Service: Cardiovascular;  Laterality: N/A;  . NOSE SURGERY     sleep apnea surgery  . PERIPHERAL VASCULAR CATHETERIZATION N/A 03/13/2016   Procedure: Thrombin Injection;  Surgeon: Serafina Mitchell, MD;  Location: [place] CV LAB;  Service: Cardiovascular;  Laterality: N/A;  . POLYPECTOMY  04/28/2019   Procedure: POLYPECTOMY;  Surgeon: Rogene Houston, MD;  [facility];  Service: Endoscopy;;  duodenum;splenic flexure;  . STOMACH SURGERY     reflux, fundoplication  . TEE WITHOUT CARDIOVERSION N/A 08/25/2012   Procedure: TRANSESOPHAGEAL ECHOCARDIOGRAM (TEE);  Surgeon: Thayer Headings, MD;  Location: [facility] ENDOSCOPY;  Service: Cardiovascular;  Laterality: N/A;  . TEE WITHOUT CARDIOVERSION  03/23/2013   DR ROSS  . TEE WITHOUT CARDIOVERSION N/A 03/23/2013   Procedure: TRANSESOPHAGEAL ECHOCARDIOGRAM (TEE);  Surgeon: Fay Records, MD;  Location: [facility] ENDOSCOPY;  Service: Cardiovascular;  Laterality: N/A;  . TEE WITHOUT CARDIOVERSION N/A 03/26/2014   Procedure: TRANSESOPHAGEAL ECHOCARDIOGRAM (TEE);  Surgeon: Josue Hector, MD;  Location: [facility] ENDOSCOPY;  Service: Cardiovascular;  Laterality: N/A;  . TONSILLECTOMY      Current Outpatient Medications  Medication Sig Dispense Refill  . albuterol (VENTOLIN HFA) 108 (90 Base) MCG/ACT inhaler Inhale 2 puffs into the lungs every 6 (six) hours as needed for wheezing or shortness of  breath. 8 g 0  . atorvastatin (LIPITOR) 10 MG tablet Take 1 tablet (10 mg total) by mouth daily. 30 tablet 4  . benzonatate (TESSALON) 100 MG capsule Take 1 capsule (100 mg total) by mouth 2 (two) times daily as needed for cough. 20 capsule 0  . blood glucose meter kit and supplies Dispense based on patient and insurance preference. Use to check blood sugar once a day(FOR ICD-10 E10.9, E11.9). 1 each 0  . Cholecalciferol (VITAMIN D) 2000 UNITS CAPS Take 4,000 Units by mouth daily.     Marland Kitchen dofetilide (TIKOSYN) 500 MCG capsule TAKE 1 CAPSULE BY MOUTH  TWICE DAILY 180 capsule 1  . enalapril (VASOTEC) 10 MG tablet Take 1 tablet (10 mg total) by mouth 2 (two) times daily. 180 tablet 3  . esomeprazole (NEXIUM) 40 MG capsule TAKE 1 CAPSULE BY MOUTH TWICE DAILY BEFORE A MEAL 60 capsule 5  . Ferrous Sulfate (IRON) 325 (65 Fe) MG TABS Take 1 tablet by mouth once daily 90 tablet 3  . fluticasone (FLONASE) 50 MCG/ACT nasal spray Place 1 spray into both nostrils daily as needed for allergies.     . Fluticasone-Salmeterol (ADVAIR DISKUS) 250-50 MCG/DOSE AEPB Inhale 1 puff into the lungs every 12 (twelve) hours. 60 each 0  . furosemide (LASIX) 40 MG tablet TAKE 1 TABLET BY MOUTH  DAILY 90 tablet 1  . hydrOXYzine (VISTARIL) 25 MG capsule Take 2 capsules (50 mg total) by mouth 3 (three) times daily as needed for anxiety. 30 capsule 0  . loratadine (CLARITIN) 10 MG tablet Take 10 mg by mouth daily as needed for allergies.     Marland Kitchen MAGNESIUM PO Take 500 mg by mouth daily.     . metFORMIN (GLUCOPHAGE) 1000 MG tablet TAKE 1 TABLET BY MOUTH  TWICE DAILY WITH MEALS 180 tablet 1  . metoprolol tartrate (LOPRESSOR) 50 MG tablet Take 50 mg by mouth daily.    . nitroGLYCERIN (NITROSTAT) 0.4 MG SL tablet DISSOLVE 1 TABLET UNDER THE TONGUE EVERY 5 MINUTES AS  NEEDED FOR CHEST PAIN. MAX  OF 3 TABLETS IN 15 MINUTES. CALL 911 IF PAIN PERSISTS. 25 tablet 1  . oxybutynin (DITROPAN) 5 MG tablet TAKE ONE-HALF TABLET (2.38m) BY  MOUTH TWO  TIMES DAILY AS  NEEDED FOR BLADDER SPASMS (Patient taking differently: Take 2.5 mg by mouth 2 (two) times daily as needed for bladder spasms. TAKE ONE-HALF TABLET (2.583m BY  MOUTH TWO TIMES DAILY AS  NEEDED FOR BLADDER SPASMS) 30 tablet 5  . potassium chloride SA (KLOR-CON) 20 MEQ tablet TAKE 1 TABLET BY MOUTH  DAILY 90 tablet 1  . rivaroxaban (XARELTO) 20 MG TABS tablet Take 1 tablet (20 mg total) by mouth daily with supper.    . vitamin B-12 (CYANOCOBALAMIN) 500 MCG tablet Take  500 mcg by mouth daily.     No current facility-administered medications for this visit.   Allergies:  Patient has no known allergies.   Social History: The patient  reports that he quit smoking about 9 years ago. His smoking use included cigarettes. He started smoking about 17 years ago. He has a 7.00 pack-year smoking history. He has never used smokeless tobacco. He reports that he does not drink alcohol and does not use drugs.   Family History: The patient's family history includes Anemia in his mother; Aneurysm in his maternal grandmother; CAD (age of onset: 37) in his father; Diabetes in his brother, father, mother, and paternal grandmother; Gout in his mother; Heart attack in his father; Stroke in his maternal grandfather and paternal grandfather; Transient ischemic attack in his father; Vascular Disease in his father.   ROS:  Please see the history of present illness. Otherwise, complete review of systems is positive for none.  All other systems are reviewed and negative.   Physical Exam: VS:  BP 116/62   Pulse 73   Ht _0  (1.854 m)   Wt 252 lb 6.4 oz (114.5 kg)   SpO2 95%   BMI 33.30 kg/m , BMI Body mass index is 33.3 kg/m.  Wt Readings from Last 3 Encounters:  07/12/20 252 lb 6.4 oz (114.5 kg)  05/10/20 260 lb (117.9 kg)  02/27/20 253 lb (114.8 kg)    General: Patient appears comfortable at rest. Neck: Supple, no elevated JVP or carotid bruits, no thyromegaly. Lungs: Clear to auscultation,  nonlabored breathing at rest. Cardiac: Regular rate and rhythm, no S3 or significant systolic murmur, no pericardial rub. Extremities: No pitting edema, distal pulses 2+. Skin: Warm and dry. Musculoskeletal: No kyphosis. Neuropsychiatric: Alert and oriented x3, affect grossly appropriate.  ECG:   Recent EKG May 10, 2020 sinus rhythm rate.  Premature atrial complexes rate of 60.  Recent Labwork: 01/29/2020: ALT 18; AST 16; BUN 13; Creatinine, Ser 0.89; Hemoglobin 13.5; Platelets 357; Potassium 4.9; Sodium 143     Component Value Date/Time   CHOL 161 11/25/2018 1443   TRIG 219 (H) 11/25/2018 1443   HDL 35 (L) 11/25/2018 1443   CHOLHDL 4.6 11/25/2018 1443   CHOLHDL 4.7 01/31/2016 0308   VLDL 56 (H) 01/31/2016 0308   LDLCALC 82 11/25/2018 1443    Other Studies Reviewed Today:  Carlton Adam Myoview 05/17/2020 Study Result  Narrative & Impression    The study is normal.  This is a low risk study.  The left ventricular ejection fraction is hyperdynamic (>65%).   Normal resting and stress perfusion. No ischemia or infarction EF 66%      Echocardiogram 06/05/2020  1. Left ventricular ejection fraction, by estimation, is 60 to 65%. The left ventricle has normal function. The left ventricle has no regional wall motion abnormalities. Left ventricular diastolic parameters were normal. The average left ventricular global longitudinal strain is - 18.0 %. The global longitudinal strain is normal. 2. Right ventricular systolic function is normal. The right ventricular size is normal. 3. The mitral valve is normal in structure. No evidence of mitral valve regurgitation. No evidence of mitral stenosis. 4. The aortic valve is tricuspid. Aortic valve regurgitation is not visualized. No aortic stenosis is present. Comparison(s): Echocardiogram done 10/14/18 showed an EF of 60-65%.  Assessment and Plan:  1. Chest discomfort   2. SOB (shortness of breath)   3. Persistent atrial  fibrillation ([place])    1. Chest discomfort Currently denies any chest discomfort.  We discussed the results of stress test which was determined to be low risk.  Continue sublingual nitroglycerin 0.4 mg as needed.  2. SOB (shortness of breath) Complains of some mild exertional fatigue and mild exertional dyspnea.  Recent follow-up echo as noted above.  EF 60 to 65%,  no WMA's.  Normal diastolic function.  No valvular abnormalities.  Continue metoprolol 50 mg daily  3. Persistent atrial fibrillation [facility]) Recent EKG May 10, 2020 sinus rhythm rate.  Premature atrial complexes rate of 60.  He is maintaining sinus rhythm on Tikosyn.  Continue Tikosyn 500 mcg p.o. twice daily.  Continue metoprolol 50 mg daily.  Continue Xarelto 20 mg p.o. daily  4.  Hyperlipidemia. Continue atorvastatin 10 mg daily.  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  Recent lipids showed total cholesterol 113, HDL 32, triglycerides 176, LDL 56.  5.  Hypertension BP well controlled on current therapy BP today 116/62.  Continue enalapril 10 mg p.o. twice daily.  Continue Lasix 40 mg p.o. daily.  Continue metoprolol 50 mg p.o. daily.  Continue potassium supplementation 20 mEq daily.  Medication Adjustments/Labs and Tests Ordered: Current medicines are reviewed at length with the patient today.  Concerns regarding medicines are outlined above.   Disposition: Follow-up with Dr. Harl Bowie or APP 6 months  Signed, Levell July, NP 07/12/2020 3:27 PM    [facility] [place], [place], [place] [address] Phone: [phone]; Fax: [phone]

## 2020-07-12 ENCOUNTER — Ambulatory Visit (INDEPENDENT_AMBULATORY_CARE_PROVIDER_SITE_OTHER): Payer: BC Managed Care – PPO | Admitting: Family Medicine

## 2020-07-12 ENCOUNTER — Other Ambulatory Visit: Payer: Self-pay

## 2020-07-12 ENCOUNTER — Encounter: Payer: Self-pay | Admitting: Family Medicine

## 2020-07-12 VITALS — BP 116/62 | HR 73 | Ht 73.0 in | Wt 252.4 lb

## 2020-07-12 DIAGNOSIS — I4819 Other persistent atrial fibrillation: Secondary | ICD-10-CM

## 2020-07-12 DIAGNOSIS — R0602 Shortness of breath: Secondary | ICD-10-CM

## 2020-07-12 DIAGNOSIS — R0789 Other chest pain: Secondary | ICD-10-CM | POA: Diagnosis not present

## 2020-07-12 DIAGNOSIS — E782 Mixed hyperlipidemia: Secondary | ICD-10-CM | POA: Diagnosis not present

## 2020-07-12 DIAGNOSIS — I10 Essential (primary) hypertension: Secondary | ICD-10-CM

## 2020-07-12 NOTE — Patient Instructions (Signed)
Medication Instructions:  Continue all current medications.   Labwork: none  Testing/Procedures: none  Follow-Up: 6 months   Any Other Special Instructions Will Be Listed Below (If Applicable).   If you need a refill on your cardiac medications before your next appointment, please call your pharmacy.  

## 2020-07-16 ENCOUNTER — Other Ambulatory Visit: Payer: Self-pay | Admitting: Internal Medicine

## 2020-07-17 NOTE — Telephone Encounter (Signed)
Prescription refill request for Xarelto received.  Indication: Last office visit:07/12/20 Weight:114 kg Age:55 Scr:0.96 (06/25/20) CrCl:137 ml.min

## 2020-10-21 ENCOUNTER — Other Ambulatory Visit: Payer: Self-pay | Admitting: Internal Medicine

## 2020-11-08 DIAGNOSIS — R0789 Other chest pain: Secondary | ICD-10-CM | POA: Diagnosis not present

## 2020-11-08 DIAGNOSIS — Z20822 Contact with and (suspected) exposure to covid-19: Secondary | ICD-10-CM | POA: Diagnosis not present

## 2020-11-08 DIAGNOSIS — M62830 Muscle spasm of back: Secondary | ICD-10-CM | POA: Diagnosis not present

## 2020-11-08 DIAGNOSIS — M549 Dorsalgia, unspecified: Secondary | ICD-10-CM | POA: Diagnosis not present

## 2020-11-12 DIAGNOSIS — S29011D Strain of muscle and tendon of front wall of thorax, subsequent encounter: Secondary | ICD-10-CM | POA: Diagnosis not present

## 2020-11-13 ENCOUNTER — Other Ambulatory Visit (INDEPENDENT_AMBULATORY_CARE_PROVIDER_SITE_OTHER): Payer: Self-pay | Admitting: Internal Medicine

## 2020-11-15 ENCOUNTER — Encounter: Payer: Self-pay | Admitting: Nurse Practitioner

## 2020-11-15 ENCOUNTER — Other Ambulatory Visit: Payer: Self-pay

## 2020-11-15 ENCOUNTER — Ambulatory Visit: Payer: BC Managed Care – PPO | Admitting: Nurse Practitioner

## 2020-11-15 VITALS — BP 120/69 | HR 96 | Ht 73.0 in | Wt 247.6 lb

## 2020-11-15 DIAGNOSIS — M940 Chondrocostal junction syndrome [Tietze]: Secondary | ICD-10-CM | POA: Diagnosis not present

## 2020-11-15 DIAGNOSIS — R0789 Other chest pain: Secondary | ICD-10-CM

## 2020-11-15 NOTE — Progress Notes (Signed)
   Subjective:    Patient ID: Austin Woods, male    DOB: 1965/10/14, 55 y.o.   MRN: [id number]  HPI  Patient arrives for follow up on back pain- patient was seen in urgent care and given muscle relaxer. Patient states he is doing better. No specific history of injury.  Was seen in urgent care who billed this as a worksite injury but patient states this occurred after work.  States he came home from work and fell asleep in the recliner.  No specific history of injury.  Began 1 week ago.  States he woke up from his nap and had localized pressure in the right upper chest wall in the right upper flank area.  Mainly hurts with movement or deep breath.  No fever.  No rash.  No cough.  Overall symptoms are much improved since the beginning, still having some discomfort.  Has been taking Tylenol for discomfort.  Was given muscle relaxants but states he stopped these since they did not help. No chest pain/ischemic type pain unusual shortness of breath.  States his work-up in urgent care was normal, diagnosed with atypical chest pain.      Objective:   Physical Exam NAD.  Alert, oriented.  Lungs clear.  Heart regular rate rhythm.  Normal ROM of the right shoulder without tenderness.  Tenderness in the right upper anterior chest wall with deep palpation.  Minimal tenderness noted in the right upper axillary area.  Skin is clear.  Today's Vitals   11/15/20 1124  BP: 120/69  Pulse: 96  Weight: 247 lb 9.6 oz (112.3 kg)  Height: '6\' 1"'$  (1.854 m)   Body mass index is 32.67 kg/m. Patient is brought a copy of his chest x-ray from urgent care which shows no acute changes.       Assessment & Plan:  Chest wall pain  Costochondritis Continue Tylenol as directed.  Reviewed symptomatic care and warning signs.  Patient to call back by the end of next week if no improvement, sooner if worse. Return if symptoms worsen or fail to improve, for Work excuse 8/8 - 8/21; RTW 8/22.

## 2020-11-19 ENCOUNTER — Ambulatory Visit: Payer: BC Managed Care – PPO | Admitting: Family Medicine

## 2020-11-22 ENCOUNTER — Telehealth: Payer: BC Managed Care – PPO | Admitting: Family Medicine

## 2020-11-22 ENCOUNTER — Telehealth: Payer: Self-pay | Admitting: Nurse Practitioner

## 2020-11-22 NOTE — Telephone Encounter (Signed)
Patient had FMLA faxed over on 8/19 but be completed . Work note is attached to form on your desk to be completed. He was seen on 8/12

## 2020-11-27 NOTE — Telephone Encounter (Signed)
Form completed on 8/20 and forwarded to [facility].

## 2020-12-06 ENCOUNTER — Encounter: Payer: Self-pay | Admitting: Internal Medicine

## 2020-12-06 ENCOUNTER — Other Ambulatory Visit: Payer: Self-pay

## 2020-12-06 ENCOUNTER — Ambulatory Visit (INDEPENDENT_AMBULATORY_CARE_PROVIDER_SITE_OTHER): Payer: BC Managed Care – PPO | Admitting: Internal Medicine

## 2020-12-06 VITALS — BP 143/79 | HR 56 | Ht 73.0 in | Wt 249.4 lb

## 2020-12-06 DIAGNOSIS — I10 Essential (primary) hypertension: Secondary | ICD-10-CM | POA: Diagnosis not present

## 2020-12-06 DIAGNOSIS — G4733 Obstructive sleep apnea (adult) (pediatric): Secondary | ICD-10-CM

## 2020-12-06 DIAGNOSIS — I4819 Other persistent atrial fibrillation: Secondary | ICD-10-CM | POA: Diagnosis not present

## 2020-12-06 NOTE — Patient Instructions (Signed)
Medication Instructions:  Continue all current medications.  Labwork: none  Testing/Procedures: none  Follow-Up: 6 months - afib clinc   Any Other Special Instructions Will Be Listed Below (If Applicable).   If you need a refill on your cardiac medications before your next appointment, please call your pharmacy.

## 2020-12-06 NOTE — Progress Notes (Signed)
PCP: Erven Colla, DO Primary Cardiologist: previously Dr Bronson Ing Primary EP: Dr Rayann Heman  Austin Woods is a 55 y.o. male who presents today for routine electrophysiology followup.  Since last being seen in our clinic, the patient reports doing reasonably well.  He has fatigue.  Diffuse myalgias and arm/hand tingling.  I have advised that he see primary care.   He has not seen them in quite some time.  Today, he denies symptoms of palpitations, chest pain, shortness of breath,  lower extremity edema, dizziness, presyncope, or syncope.  The patient is otherwise without complaint today.   Past Medical History:  Diagnosis Date   A-fib ([place])    Anemia    iron def anemia   Arthritis    Asthma    Coronary artery disease    Dysrhythmia    in and out Afib   GERD (gastroesophageal reflux disease)    Headache    High cholesterol    History of DVT (deep vein thrombosis)    a. left leg following ablation for SVT   Hypertension    Persistent atrial fibrillation ([place])    a. Dx 08/2012, xarelto initiated; b. 09/2012 Tikosyn initiated -> DCCV -> Sinus c. PVI 03/2013 d. PVI 03/2014   Sleep apnea    USES  CPAP    Type 2 diabetes mellitus without complication, without long-term current use of insulin ([place]) 02/16/2018   Wolff-Parkinson-White (WPW) syndrome    resolved s/p RFCA 2001 by Dr Caryl Comes   Past Surgical History:  Procedure Laterality Date   ATRIAL FIBRILLATION ABLATION  03/23/2013   PVI by DR Rayann Heman for afib   ATRIAL FIBRILLATION ABLATION N/A 03/23/2013   Procedure: ATRIAL FIBRILLATION ABLATION;  Surgeon: Coralyn Mark, MD;  Location: [facility] CATH LAB;  Service: Cardiovascular;  Laterality: N/A;   ATRIAL FIBRILLATION ABLATION N/A 03/27/2014   PVI Dr Norman Herrlich SURGERY     BIOPSY  02/28/2020   Procedure: BIOPSY;  Surgeon: Rogene Houston, MD;  [facility];  Service: Endoscopy;;  antral(pre-pyloric)    CARDIAC CATHETERIZATION N/A 02/03/2016   Procedure: Left Heart  Cath and Coronary Angiography;  Surgeon: Nelva Bush, MD;  Location: [facility] CV [facility];  Service: Cardiovascular;  Laterality: N/A;   CARDIAC CATHETERIZATION N/A 02/03/2016   Procedure: Intravascular Pressure Wire/FFR Study;  Surgeon: Nelva Bush, MD;  Location: [place] CV LAB;  Service: Cardiovascular;  Laterality: N/A;   CARDIAC CATHETERIZATION N/A 02/03/2016   Procedure: Coronary Stent Intervention;  Surgeon: Nelva Bush, MD;  Location: [place] [facility] LAB;  Service: Cardiovascular;  Laterality: N/A;   CARDIAC SURGERY     CARDIOVERSION N/A 08/25/2012   Procedure: CARDIOVERSION;  Surgeon: Thayer Headings, MD;  Location: [place];  Service: Cardiovascular;  Laterality: N/A;   CARDIOVERSION N/A 09/06/2012   Procedure: CARDIOVERSION/Bedside;  Surgeon: Carlena Bjornstad, MD;  Location: [place];  Service: Cardiovascular;  Laterality: N/A;   CARDIOVERSION N/A 01/12/2014   Procedure: CARDIOVERSION;  Surgeon: Pixie Casino, MD;  Location: [facility] ENDOSCOPY;  Service: Cardiovascular;  Laterality: N/A;   CARDIOVERSION N/A 01/30/2016   Procedure: CARDIOVERSION;  Surgeon: Sueanne Margarita, MD;  Location[facility] ENDOSCOPY;  Service: Cardiovascular;  Laterality: N/A;   CARDIOVERSION N/A 04/14/2016   Procedure: CARDIOVERSION;  Surgeon: Pixie Casino, MD;  Location: [facility] ENDOSCOPY;  Service: Cardiovascular;  Laterality: N/A;   COLONOSCOPY N/A 12/04/2015   Procedure: COLONOSCOPY;  Surgeon: Rogene Houston, MD;  [facility];  Service:  Endoscopy;  Laterality: N/A;  730   COLONOSCOPY WITH PROPOFOL N/A 04/28/2019   Procedure: COLONOSCOPY WITH PROPOFOL;  Surgeon: Rogene Houston, MD;  [facility];  Service: Endoscopy;  Laterality: N/A;   CORONARY ANGIOPLASTY     CORONARY ANGIOPLASTY WITH STENT PLACEMENT     ELECTROPHYSIOLOGIC STUDY N/A 02/18/2016   Procedure: Atrial Fibrillation Ablation;  Surgeon: Thompson Grayer, MD;  Location: [place] CV LAB;  Service: Cardiovascular;  Laterality: N/A;    ESOPHAGOGASTRODUODENOSCOPY (EGD) WITH PROPOFOL N/A 04/28/2019   Procedure: ESOPHAGOGASTRODUODENOSCOPY (EGD) WITH PROPOFOL;  Surgeon: Rogene Houston, MD;  [facility];  Service: Endoscopy;  Laterality: N/A;  7:30   ESOPHAGOGASTRODUODENOSCOPY (EGD) WITH PROPOFOL N/A 02/28/2020   Procedure: ESOPHAGOGASTRODUODENOSCOPY (EGD) WITH PROPOFOL;  Surgeon: Rogene Houston, MD;  [facility];  Service: Endoscopy;  Laterality: N/A;  1245   implantable loop recorder removal  03/01/2019   MDT Reveal LINQ removed in office   INGUINAL HERNIA REPAIR Bilateral 06/04/2017   Procedure: OPEN BILATERAL INGUINAL HERNIA REPAIR;  Surgeon: Judeth Horn, MD;  Location: [place];  Service: General;  Laterality: Bilateral;   LOOP RECORDER IMPLANT N/A 06/29/2014   Procedure: LOOP RECORDER IMPLANT;  Surgeon: Thompson Grayer, MD;  Location: [facility] CATH LAB;  Service: Cardiovascular;  Laterality: N/A;   NOSE SURGERY     sleep apnea surgery   PERIPHERAL VASCULAR CATHETERIZATION N/A 03/13/2016   Procedure: Thrombin Injection;  Surgeon: Serafina Mitchell, MD;  Location: [place] CV LAB;  Service: Cardiovascular;  Laterality: N/A;   POLYPECTOMY  04/28/2019   Procedure: POLYPECTOMY;  Surgeon: Rogene Houston, MD;  [facility];  Service: Endoscopy;;  duodenum;splenic flexure;   STOMACH SURGERY     reflux, fundoplication   TEE WITHOUT CARDIOVERSION N/A 08/25/2012   Procedure: TRANSESOPHAGEAL ECHOCARDIOGRAM (TEE);  Surgeon: Thayer Headings, MD;  Location: [place];  Service: Cardiovascular;  Laterality: N/A;   TEE WITHOUT CARDIOVERSION  03/23/2013   DR ROSS   TEE WITHOUT CARDIOVERSION N/A 03/23/2013   Procedure: TRANSESOPHAGEAL ECHOCARDIOGRAM (TEE);  Surgeon: Fay Records, MD;  Location: [facility] ENDOSCOPY;  Service: Cardiovascular;  Laterality: N/A;   TEE WITHOUT CARDIOVERSION N/A 03/26/2014   Procedure: TRANSESOPHAGEAL ECHOCARDIOGRAM (TEE);  Surgeon: Josue Hector, MD;  Location: [facility] ENDOSCOPY;  Service:  Cardiovascular;  Laterality: N/A;   TONSILLECTOMY      ROS- all systems are reviewed and negatives except as per HPI above  Current Outpatient Medications  Medication Sig Dispense Refill   albuterol (VENTOLIN HFA) 108 (90 Base) MCG/ACT inhaler Inhale 2 puffs into the lungs every 6 (six) hours as needed for wheezing or shortness of breath. 8 g 0   atorvastatin (LIPITOR) 10 MG tablet Take 1 tablet (10 mg total) by mouth daily. 30 tablet 4   benzonatate (TESSALON) 100 MG capsule Take 1 capsule (100 mg total) by mouth 2 (two) times daily as needed for cough. 20 capsule 0   blood glucose meter kit and supplies Dispense based on patient and insurance preference. Use to check blood sugar once a day(FOR ICD-10 E10.9, E11.9). 1 each 0   Cholecalciferol (VITAMIN D) 2000 UNITS CAPS Take 4,000 Units by mouth daily.      dofetilide (TIKOSYN) 500 MCG capsule TAKE 1 CAPSULE BY MOUTH  TWICE DAILY 180 capsule 3   enalapril (VASOTEC) 10 MG tablet Take 1 tablet (10 mg total) by mouth 2 (two) times daily. 180 tablet 3   esomeprazole (NEXIUM) 40 MG capsule TAKE 1  CAPSULE BY MOUTH TWICE DAILY BEFORE A MEAL 60 capsule 2   Ferrous Sulfate (IRON) 325 (65 Fe) MG TABS Take 1 tablet by mouth once daily 90 tablet 3   fluticasone (FLONASE) 50 MCG/ACT nasal spray Place 1 spray into both nostrils daily as needed for allergies.      Fluticasone-Salmeterol (ADVAIR DISKUS) 250-50 MCG/DOSE AEPB Inhale 1 puff into the lungs every 12 (twelve) hours. 60 each 0   furosemide (LASIX) 40 MG tablet TAKE 1 TABLET BY MOUTH  DAILY 90 tablet 1   hydrOXYzine (VISTARIL) 25 MG capsule Take 2 capsules (50 mg total) by mouth 3 (three) times daily as needed for anxiety. 30 capsule 0   loratadine (CLARITIN) 10 MG tablet Take 10 mg by mouth daily as needed for allergies.      MAGNESIUM PO Take 500 mg by mouth daily.      metFORMIN (GLUCOPHAGE) 1000 MG tablet TAKE 1 TABLET BY MOUTH  TWICE DAILY WITH MEALS 180 tablet 1   metoprolol tartrate  (LOPRESSOR) 50 MG tablet Take 50 mg by mouth daily.     nitroGLYCERIN (NITROSTAT) 0.4 MG SL tablet DISSOLVE 1 TABLET UNDER THE TONGUE EVERY 5 MINUTES AS  NEEDED FOR CHEST PAIN. MAX  OF 3 TABLETS IN 15 MINUTES. CALL 911 IF PAIN PERSISTS. 25 tablet 1   oxybutynin (DITROPAN) 5 MG tablet TAKE ONE-HALF TABLET (2.84m) BY  MOUTH TWO TIMES DAILY AS  NEEDED FOR BLADDER SPASMS (Patient taking differently: TAKE ONE-HALF TABLET (2.586m BY  MOUTH TWO TIMES DAILY AS  NEEDED FOR BLADDER SPASMS) 30 tablet 5   potassium chloride SA (KLOR-CON) 20 MEQ tablet TAKE 1 TABLET BY MOUTH  DAILY 90 tablet 1   vitamin B-12 (CYANOCOBALAMIN) 500 MCG tablet Take 500 mcg by mouth daily.     XARELTO 20 MG TABS tablet TAKE 1 TABLET BY MOUTH ONCE DAILY WITH SUPPER 30 tablet 5   No current facility-administered medications for this visit.    Physical Exam: Vitals:   12/06/20 1028  BP: (!) 143/79  Pulse: (!) 56  SpO2: 96%  Weight: 249 lb 6.4 oz (113.1 kg)  Height: 6' 1"  (1.854 m)    GEN- The patient is well appearing, alert and oriented x 3 today.   Head- normocephalic, atraumatic Eyes-  Sclera clear, conjunctiva pink Ears- hearing intact Oropharynx- clear Lungs- Clear to ausculation bilaterally, normal work of breathing Heart- Regular rate and rhythm, no murmurs, rubs or gallops, PMI not laterally displaced GI- soft, NT, ND, + BS Extremities- no clubbing, cyanosis, or edema MS- his calf muscles appear atrophyic  Wt Readings from Last 3 Encounters:  12/06/20 249 lb 6.4 oz (113.1 kg)  11/15/20 247 lb 9.6 oz (112.3 kg)  07/12/20 252 lb 6.4 oz (114.5 kg)   Echo and myoview are reviewed EF 60%, normal myoview Assessment and Plan:  Persistent afib Currently controlled with tiKellogg/22 reviewed Will follow-up closely in the AF clinic in 6 months  2. Obesity Body mass index is 32.9 kg/m. Lifestyle modification advised  3. HTN Stable No change required today  4. CAD No ischemic symptoms  5.  OSA Compliance with CPAP is advised  Risks, benefits and potential toxicities for medications prescribed and/or refilled reviewed with patient today.   AF clinic in 6 months  JaThompson GrayerD, FAHealthsouth Rehabilitation Hospital Of [facility]/05/2020 10:37 AM

## 2021-01-14 DIAGNOSIS — Z23 Encounter for immunization: Secondary | ICD-10-CM | POA: Diagnosis not present

## 2021-01-16 ENCOUNTER — Other Ambulatory Visit: Payer: Self-pay | Admitting: Internal Medicine

## 2021-01-17 ENCOUNTER — Other Ambulatory Visit: Payer: Self-pay | Admitting: Family Medicine

## 2021-01-17 NOTE — Telephone Encounter (Signed)
Xarelto 20mg  refill request received. Pt is 55 years old, weight-113.1kg, Crea-0.96 on 06/25/2020 via scanned labs, last seen by Dr. Rayann Heman on 12/06/2020, Diagnosis-Afib, CrCl-135.73ml/min; Dose is appropriate based on dosing criteria. Will send in refill to requested pharmacy.

## 2021-01-23 DIAGNOSIS — M5410 Radiculopathy, site unspecified: Secondary | ICD-10-CM | POA: Diagnosis not present

## 2021-01-23 DIAGNOSIS — Z8679 Personal history of other diseases of the circulatory system: Secondary | ICD-10-CM | POA: Diagnosis not present

## 2021-01-23 DIAGNOSIS — E119 Type 2 diabetes mellitus without complications: Secondary | ICD-10-CM | POA: Diagnosis not present

## 2021-01-23 DIAGNOSIS — I480 Paroxysmal atrial fibrillation: Secondary | ICD-10-CM | POA: Diagnosis not present

## 2021-01-23 DIAGNOSIS — E559 Vitamin D deficiency, unspecified: Secondary | ICD-10-CM | POA: Diagnosis not present

## 2021-01-24 ENCOUNTER — Other Ambulatory Visit: Payer: Self-pay | Admitting: Family Medicine

## 2021-01-28 ENCOUNTER — Other Ambulatory Visit: Payer: Self-pay | Admitting: Family Medicine

## 2021-01-28 DIAGNOSIS — E119 Type 2 diabetes mellitus without complications: Secondary | ICD-10-CM

## 2021-01-29 NOTE — Telephone Encounter (Signed)
Sent my chart message to schedule appointment 01/29/2021

## 2021-01-30 DIAGNOSIS — Z6833 Body mass index (BMI) 33.0-33.9, adult: Secondary | ICD-10-CM | POA: Diagnosis not present

## 2021-01-30 DIAGNOSIS — R1013 Epigastric pain: Secondary | ICD-10-CM | POA: Diagnosis not present

## 2021-01-30 DIAGNOSIS — K429 Umbilical hernia without obstruction or gangrene: Secondary | ICD-10-CM | POA: Diagnosis not present

## 2021-01-30 DIAGNOSIS — M6208 Separation of muscle (nontraumatic), other site: Secondary | ICD-10-CM | POA: Diagnosis not present

## 2021-02-05 NOTE — Telephone Encounter (Signed)
Send 2nd requesting to schedule appointment 02/05/21

## 2021-02-07 DIAGNOSIS — I700 Atherosclerosis of aorta: Secondary | ICD-10-CM | POA: Diagnosis not present

## 2021-02-07 DIAGNOSIS — K4090 Unilateral inguinal hernia, without obstruction or gangrene, not specified as recurrent: Secondary | ICD-10-CM | POA: Diagnosis not present

## 2021-02-07 DIAGNOSIS — K429 Umbilical hernia without obstruction or gangrene: Secondary | ICD-10-CM | POA: Diagnosis not present

## 2021-02-09 ENCOUNTER — Other Ambulatory Visit (INDEPENDENT_AMBULATORY_CARE_PROVIDER_SITE_OTHER): Payer: Self-pay | Admitting: Internal Medicine

## 2021-02-13 ENCOUNTER — Telehealth: Payer: Self-pay | Admitting: Internal Medicine

## 2021-02-13 DIAGNOSIS — K429 Umbilical hernia without obstruction or gangrene: Secondary | ICD-10-CM | POA: Diagnosis not present

## 2021-02-13 DIAGNOSIS — K4091 Unilateral inguinal hernia, without obstruction or gangrene, recurrent: Secondary | ICD-10-CM | POA: Diagnosis not present

## 2021-02-13 DIAGNOSIS — Z6833 Body mass index (BMI) 33.0-33.9, adult: Secondary | ICD-10-CM | POA: Diagnosis not present

## 2021-02-13 NOTE — Telephone Encounter (Signed)
   [facility] Pre-operative Risk Assessment    Request for surgical clearance:  What type of surgery is being performed? Open umbilical hernia repair and laparoscopic inguinal hernia repair  When is this surgery scheduled? tbd   What type of clearance is required (medical clearance vs. Pharmacy clearance to hold med vs. Both)? Medical clearance  Are there any medications that need to be held prior to surgery and how long?  rivaroxaban (XARELTO) 20 MG TABS tablet  Practice name and name of physician performing surgery? Dr. Rocco Serene   What is your office phone number [phone]    7.   What is your office fax number [phone]  8.   Anesthesia type (None, local, MAC, general) ? general   Austin Woods 02/13/2021, 2:18 PM  _________________________________________________________________   (provider comments below)

## 2021-02-13 NOTE — Telephone Encounter (Signed)
Patient with diagnosis of A Fib on Xarelto for anticoagulation.    Procedure: Open umbilical hernia repair and laparoscopic inguinal hernia repair   Date of procedure: TBD   CHA2DS2-VASc Score = 3  This indicates a 3.2% annual risk of stroke. The patient's score is based upon: CHF History: 1 HTN History: 1 Diabetes History: 1 Stroke History: 0 Vascular Disease History: 0 Age Score: 0 Gender Score: 0  CrCl 87 mL/min using adjusted body weight Platelet count overdue  Per office protocol, patient can hold Xarelto for 3 days prior to procedure.

## 2021-02-13 NOTE — Telephone Encounter (Signed)
Left voice mail to call back 

## 2021-02-18 DIAGNOSIS — K4091 Unilateral inguinal hernia, without obstruction or gangrene, recurrent: Secondary | ICD-10-CM | POA: Diagnosis not present

## 2021-02-18 DIAGNOSIS — I480 Paroxysmal atrial fibrillation: Secondary | ICD-10-CM | POA: Diagnosis not present

## 2021-02-18 DIAGNOSIS — K429 Umbilical hernia without obstruction or gangrene: Secondary | ICD-10-CM | POA: Diagnosis not present

## 2021-02-18 DIAGNOSIS — Z01818 Encounter for other preprocedural examination: Secondary | ICD-10-CM | POA: Diagnosis not present

## 2021-02-19 NOTE — Telephone Encounter (Signed)
Linsey is calling due to the requesting office advising they have not heard back in regards to this clearance. He is requesting a callback after 4 due to being at work.

## 2021-02-20 NOTE — Telephone Encounter (Signed)
   Primary Cardiologist: Thompson Grayer, MD  Chart reviewed as part of pre-operative protocol coverage. Given past medical history and time since last visit, based on ACC/AHA guidelines, Austin Woods would be at acceptable risk for the planned procedure without further cardiovascular testing.   His RCRI is a class III risk, 6.6% risk of major cardiac event.  Patient with diagnosis of A Fib on Xarelto for anticoagulation.     Procedure: Open umbilical hernia repair and laparoscopic inguinal hernia repair   Date of procedure: TBD     CHA2DS2-VASc Score = 3  This indicates a 3.2% annual risk of stroke. The patient's score is based upon: CHF History: 1 HTN History: 1 Diabetes History: 1 Stroke History: 0 Vascular Disease History: 0 Age Score: 0 Gender Score: 0   CrCl 87 mL/min using adjusted body weight Platelet count overdue   Per office protocol, patient can hold Xarelto for 3 days prior to procedure.  Patient was advised that if he develops new symptoms prior to surgery to contact our office to arrange a follow-up appointment.  He verbalized understanding.  I will route this recommendation to the requesting party via Epic fax function and remove from pre-op pool.  Please call with questions.  [name]. Rashi Granier NP-C    02/20/2021, 1:20 PM [place] [address] Office [phone] Fax [phone]

## 2021-03-03 DIAGNOSIS — E119 Type 2 diabetes mellitus without complications: Secondary | ICD-10-CM | POA: Diagnosis not present

## 2021-03-11 ENCOUNTER — Other Ambulatory Visit (INDEPENDENT_AMBULATORY_CARE_PROVIDER_SITE_OTHER): Payer: Self-pay | Admitting: Internal Medicine

## 2021-03-14 ENCOUNTER — Other Ambulatory Visit (INDEPENDENT_AMBULATORY_CARE_PROVIDER_SITE_OTHER): Payer: Self-pay | Admitting: Internal Medicine

## 2021-03-14 DIAGNOSIS — K4091 Unilateral inguinal hernia, without obstruction or gangrene, recurrent: Secondary | ICD-10-CM | POA: Diagnosis not present

## 2021-03-17 DIAGNOSIS — K4091 Unilateral inguinal hernia, without obstruction or gangrene, recurrent: Secondary | ICD-10-CM | POA: Diagnosis not present

## 2021-03-17 DIAGNOSIS — K420 Umbilical hernia with obstruction, without gangrene: Secondary | ICD-10-CM | POA: Diagnosis not present

## 2021-03-17 DIAGNOSIS — I2510 Atherosclerotic heart disease of native coronary artery without angina pectoris: Secondary | ICD-10-CM | POA: Diagnosis not present

## 2021-05-15 ENCOUNTER — Telehealth: Payer: Self-pay | Admitting: Internal Medicine

## 2021-05-15 NOTE — Telephone Encounter (Signed)
Says that he has sent over a long term care form on behalf of this pt that needs to be filled out.. please advise if this has been received and/or filled out.

## 2021-05-15 NOTE — Telephone Encounter (Signed)
Returned call.  Advised paperwork had not been received yet.  He will refax.

## 2021-05-16 NOTE — Telephone Encounter (Signed)
Pieter Partridge called again to check status of paperwork

## 2021-05-21 NOTE — Telephone Encounter (Signed)
Left detailed message for Pt requesting approval to complete documentation received from [employer].

## 2021-05-21 NOTE — Telephone Encounter (Signed)
Document has been completed.  Await call back from Pt to send.

## 2021-05-23 NOTE — Telephone Encounter (Signed)
Received confirmation from Pt via mychart to send documentation as requested.  Fax sent and confirmation received.  NO further action necessary.

## 2021-05-23 NOTE — Telephone Encounter (Signed)
Left detailed message on Pt's phone and wife's phone per DPR.  Advised would NOT be sending information to [employer] until VERBAL approval obtained from Pt or wife.  Advised to call back ASAP if information should be sent.

## 2021-05-25 ENCOUNTER — Other Ambulatory Visit: Payer: Self-pay | Admitting: Nurse Practitioner

## 2021-05-25 DIAGNOSIS — E119 Type 2 diabetes mellitus without complications: Secondary | ICD-10-CM

## 2021-05-29 NOTE — Telephone Encounter (Signed)
Sent my chart message to schedule appointment 05/29/21

## 2021-06-06 ENCOUNTER — Other Ambulatory Visit: Payer: Self-pay | Admitting: Family Medicine

## 2021-06-06 DIAGNOSIS — E119 Type 2 diabetes mellitus without complications: Secondary | ICD-10-CM

## 2021-06-10 ENCOUNTER — Other Ambulatory Visit: Payer: Self-pay | Admitting: Family Medicine

## 2021-06-10 DIAGNOSIS — E119 Type 2 diabetes mellitus without complications: Secondary | ICD-10-CM

## 2021-06-11 ENCOUNTER — Encounter (HOSPITAL_COMMUNITY): Payer: Self-pay | Admitting: Nurse Practitioner

## 2021-06-11 ENCOUNTER — Other Ambulatory Visit: Payer: Self-pay

## 2021-06-11 ENCOUNTER — Ambulatory Visit (HOSPITAL_COMMUNITY)
Admission: RE | Admit: 2021-06-11 | Discharge: 2021-06-11 | Disposition: A | Discharge status: Home / Self Care | Payer: BC Managed Care – PPO | Source: Ambulatory Visit | Attending: Nurse Practitioner | Admitting: Nurse Practitioner

## 2021-06-11 VITALS — BP 142/80 | HR 62 | Ht 73.0 in | Wt 250.0 lb

## 2021-06-11 DIAGNOSIS — Z7901 Long term (current) use of anticoagulants: Secondary | ICD-10-CM | POA: Insufficient documentation

## 2021-06-11 DIAGNOSIS — D6869 Other thrombophilia: Secondary | ICD-10-CM

## 2021-06-11 DIAGNOSIS — Z79899 Other long term (current) drug therapy: Secondary | ICD-10-CM | POA: Insufficient documentation

## 2021-06-11 DIAGNOSIS — I10 Essential (primary) hypertension: Secondary | ICD-10-CM | POA: Insufficient documentation

## 2021-06-11 DIAGNOSIS — I4819 Other persistent atrial fibrillation: Secondary | ICD-10-CM | POA: Insufficient documentation

## 2021-06-11 LAB — MAGNESIUM: Magnesium: 1.7 mg/dL (ref 1.7–2.4)

## 2021-06-11 LAB — BASIC METABOLIC PANEL
Anion gap: 9 (ref 5–15)
BUN: 20 mg/dL (ref 6–20)
CO2: 25 mmol/L (ref 22–32)
Calcium: 9.1 mg/dL (ref 8.9–10.3)
Chloride: 103 mmol/L (ref 98–111)
Creatinine, Ser: 1.23 mg/dL (ref 0.61–1.24)
GFR, Estimated: >60 mL/min (ref >60)
Glucose, Bld: 123 mg/dL — ABNORMAL HIGH (ref 70–99)
Potassium: 4.5 mmol/L (ref 3.5–5.1)
Sodium: 137 mmol/L (ref 135–145)

## 2021-06-11 MED ORDER — FUROSEMIDE 40 MG PO TABS: 40.0000 mg | ORAL_TABLET | ORAL | Status: AC | PRN

## 2021-06-11 NOTE — Progress Notes (Signed)
Primary Care Physician: No primary care provider on file. Referring Physician:Dr. Tc Kapusta is a 56 y.o. male with a h/o afib x 2 ablations, and maintaining SR on Tikosyn. He is here for Tikosyn surveillance. Qtc is stable. He is on xarelto for a CHA2DS2VASc  score of 3.  Today, he denies symptoms of palpitations, chest pain, shortness of breath, orthopnea, PND, lower extremity edema, dizziness, presyncope, syncope, or neurologic sequela. The patient is tolerating medications without difficulties and is otherwise without complaint today.   Past Medical History:  Diagnosis Date   A-fib ([place])    Anemia    iron def anemia   Arthritis    Asthma    Coronary artery disease    Dysrhythmia    in and out Afib   GERD (gastroesophageal reflux disease)    Headache    High cholesterol    History of DVT (deep vein thrombosis)    a. left leg following ablation for SVT   Hypertension    Persistent atrial fibrillation ([place])    a. Dx 08/2012, xarelto initiated; b. 09/2012 Tikosyn initiated -> DCCV -> Sinus c. PVI 03/2013 d. PVI 03/2014   Sleep apnea    USES  CPAP    Type 2 diabetes mellitus without complication, without long-term current use of insulin ([place]) 02/16/2018   Wolff-Parkinson-White (WPW) syndrome    resolved s/p RFCA 2001 by Dr Caryl Comes   Past Surgical History:  Procedure Laterality Date   ATRIAL FIBRILLATION ABLATION  03/23/2013   PVI by DR Rayann Heman for afib   ATRIAL FIBRILLATION ABLATION N/A 03/23/2013   Procedure: ATRIAL FIBRILLATION ABLATION;  Surgeon: Coralyn Mark, MD;  Location: [facility] CATH LAB;  Service: Cardiovascular;  Laterality: N/A;   ATRIAL FIBRILLATION ABLATION N/A 03/27/2014   PVI Dr Norman Herrlich SURGERY     BIOPSY  02/28/2020   Procedure: BIOPSY;  Surgeon: Rogene Houston, MD;  [facility];  Service: Endoscopy;;  antral(pre-pyloric)    CARDIAC CATHETERIZATION N/A 02/03/2016   Procedure: Left Heart Cath and Coronary Angiography;  Surgeon:  Nelva Bush, MD;  Location: [place] CV LAB;  Service: Cardiovascular;  Laterality: N/A;   CARDIAC CATHETERIZATION N/A 02/03/2016   Procedure: Intravascular Pressure Wire/FFR Study;  Surgeon: Nelva Bush, MD;  Location: [place] CV LAB;  Service: Cardiovascular;  Laterality: N/A;   CARDIAC CATHETERIZATION N/A 02/03/2016   Procedure: Coronary Stent Intervention;  Surgeon: Nelva Bush, MD;  Location: [place] CV LAB;  Service: Cardiovascular;  Laterality: N/A;   CARDIAC SURGERY     CARDIOVERSION N/A 08/25/2012   Procedure: CARDIOVERSION;  Surgeon: Thayer Headings, MD;  Location: [place];  Service: Cardiovascular;  Laterality: N/A;   CARDIOVERSION N/A 09/06/2012   Procedure: CARDIOVERSION/Bedside;  Surgeon: Carlena Bjornstad, MD;  Location: [place];  Service: Cardiovascular;  Laterality: N/A;   CARDIOVERSION N/A 01/12/2014   Procedure: CARDIOVERSION;  Surgeon: Pixie Casino, MD;  Location: [facility] ENDOSCOPY;  Service: Cardiovascular;  Laterality: N/A;   CARDIOVERSION N/A 01/30/2016   Procedure: CARDIOVERSION;  Surgeon: Sueanne Margarita, MD;  Location: [facility] ENDOSCOPY;  Service: Cardiovascular;  Laterality: N/A;   CARDIOVERSION N/A 04/14/2016   Procedure: CARDIOVERSION;  Surgeon: Pixie Casino, MD;  Location: [facility] ENDOSCOPY;  Service: Cardiovascular;  Laterality: N/A;   COLONOSCOPY N/A 12/04/2015   Procedure: COLONOSCOPY;  Surgeon: Rogene Houston, MD;  [facility];  Service: Endoscopy;  Laterality: N/A;  730   COLONOSCOPY WITH PROPOFOL  N/A 04/28/2019   Procedure: COLONOSCOPY WITH PROPOFOL;  Surgeon: Rogene Houston, MD;  [facility];  Service: Endoscopy;  Laterality: N/A;   CORONARY ANGIOPLASTY     CORONARY ANGIOPLASTY WITH STENT PLACEMENT     ELECTROPHYSIOLOGIC STUDY N/A 02/18/2016   Procedure: Atrial Fibrillation Ablation;  Surgeon: Thompson Grayer, MD;  Location: [place] CV LAB;  Service: Cardiovascular;  Laterality: N/A;   ESOPHAGOGASTRODUODENOSCOPY (EGD) WITH  PROPOFOL N/A 04/28/2019   Procedure: ESOPHAGOGASTRODUODENOSCOPY (EGD) WITH PROPOFOL;  Surgeon: Rogene Houston, MD;  [facility];  Service: Endoscopy;  Laterality: N/A;  7:30   ESOPHAGOGASTRODUODENOSCOPY (EGD) WITH PROPOFOL N/A 02/28/2020   Procedure: ESOPHAGOGASTRODUODENOSCOPY (EGD) WITH PROPOFOL;  Surgeon: Rogene Houston, MD;  [facility];  Service: Endoscopy;  Laterality: N/A;  1245   implantable loop recorder removal  03/01/2019   MDT Reveal LINQ removed in office   INGUINAL HERNIA REPAIR Bilateral 06/04/2017   Procedure: OPEN BILATERAL INGUINAL HERNIA REPAIR;  Surgeon: Judeth Horn, MD;  Location: [place];  Service: General;  Laterality: Bilateral;   LOOP RECORDER IMPLANT N/A 06/29/2014   Procedure: LOOP RECORDER IMPLANT;  Surgeon: Thompson Grayer, MD;  Location: [facility] CATH LAB;  Service: Cardiovascular;  Laterality: N/A;   NOSE SURGERY     sleep apnea surgery   PERIPHERAL VASCULAR CATHETERIZATION N/A 03/13/2016   Procedure: Thrombin Injection;  Surgeon: Serafina Mitchell, MD;  Location: [place] CV LAB;  Service: Cardiovascular;  Laterality: N/A;   POLYPECTOMY  04/28/2019   Procedure: POLYPECTOMY;  Surgeon: Rogene Houston, MD;  [facility];  Service: Endoscopy;;  duodenum;splenic flexure;   STOMACH SURGERY     reflux, fundoplication   TEE WITHOUT CARDIOVERSION N/A 08/25/2012   Procedure: TRANSESOPHAGEAL ECHOCARDIOGRAM (TEE);  Surgeon: Thayer Headings, MD;  Location: [place];  Service: Cardiovascular;  Laterality: N/A;   TEE WITHOUT CARDIOVERSION  03/23/2013   DR ROSS   TEE WITHOUT CARDIOVERSION N/A 03/23/2013   Procedure: TRANSESOPHAGEAL ECHOCARDIOGRAM (TEE);  Surgeon: Fay Records, MD;  Location: [facility] ENDOSCOPY;  Service: Cardiovascular;  Laterality: N/A;   TEE WITHOUT CARDIOVERSION N/A 03/26/2014   Procedure: TRANSESOPHAGEAL ECHOCARDIOGRAM (TEE);  Surgeon: Josue Hector, MD;  Location: [facility] ENDOSCOPY;  Service: Cardiovascular;  Laterality: N/A;    TONSILLECTOMY      Current Outpatient Medications  Medication Sig Dispense Refill   albuterol (VENTOLIN HFA) 108 (90 Base) MCG/ACT inhaler Inhale 2 puffs into the lungs every 6 (six) hours as needed for wheezing or shortness of breath. 8 g 0   atorvastatin (LIPITOR) 10 MG tablet TAKE 1 TABLET BY MOUTH  DAILY 60 tablet 2   benzonatate (TESSALON) 100 MG capsule Take 1 capsule (100 mg total) by mouth 2 (two) times daily as needed for cough. 20 capsule 0   blood glucose meter kit and supplies Dispense based on patient and insurance preference. Use to check blood sugar once a day(FOR ICD-10 E10.9, E11.9). 1 each 0   Cholecalciferol (VITAMIN D) 2000 UNITS CAPS Take 4,000 Units by mouth daily.      dofetilide (TIKOSYN) 500 MCG capsule TAKE 1 CAPSULE BY MOUTH  TWICE DAILY 180 capsule 3   enalapril (VASOTEC) 10 MG tablet Take 1 tablet (10 mg total) by mouth 2 (two) times daily. 180 tablet 3   esomeprazole (NEXIUM) 40 MG capsule Take 1 capsule (40 mg total) by mouth 2 (two) times daily before a meal. NEEDS OFFICE VISIT 60 capsule 0   Ferrous Sulfate (IRON) 325 (65  Fe) MG TABS Take 1 tablet by mouth once daily 90 tablet 3   fluticasone (FLONASE) 50 MCG/ACT nasal spray Place 1 spray into both nostrils daily as needed for allergies.      Fluticasone-Salmeterol (ADVAIR DISKUS) 250-50 MCG/DOSE AEPB Inhale 1 puff into the lungs every 12 (twelve) hours. 60 each 0   furosemide (LASIX) 40 MG tablet TAKE 1 TABLET BY MOUTH  DAILY 90 tablet 1   hydrOXYzine (VISTARIL) 25 MG capsule Take 2 capsules (50 mg total) by mouth 3 (three) times daily as needed for anxiety. 30 capsule 0   loratadine (CLARITIN) 10 MG tablet Take 10 mg by mouth daily as needed for allergies.      MAGNESIUM PO Take 500 mg by mouth daily.      metFORMIN (GLUCOPHAGE) 1000 MG tablet TAKE 1 TABLET BY MOUTH TWICE  DAILY WITH MEALS 30 tablet 0   metoprolol tartrate (LOPRESSOR) 50 MG tablet Take 50 mg by mouth daily.     nitroGLYCERIN (NITROSTAT) 0.4 MG  SL tablet DISSOLVE 1 TABLET UNDER THE TONGUE EVERY 5 MINUTES AS  NEEDED FOR CHEST PAIN. MAX  OF 3 TABLETS IN 15 MINUTES. CALL 911 IF PAIN PERSISTS. 25 tablet 1   oxybutynin (DITROPAN) 5 MG tablet TAKE ONE-HALF TABLET (2.93m) BY  MOUTH TWO TIMES DAILY AS  NEEDED FOR BLADDER SPASMS (Patient taking differently: TAKE ONE-HALF TABLET (2.579m BY  MOUTH TWO TIMES DAILY AS  NEEDED FOR BLADDER SPASMS) 30 tablet 5   potassium chloride SA (KLOR-CON) 20 MEQ tablet TAKE 1 TABLET BY MOUTH  DAILY 90 tablet 3   rivaroxaban (XARELTO) 20 MG TABS tablet TAKE 1 TABLET BY MOUTH ONCE DAILY WITH SUPPER 30 tablet 5   vitamin B-12 (CYANOCOBALAMIN) 500 MCG tablet Take 500 mcg by mouth daily.     No current facility-administered medications for this encounter.    No Known Allergies  Social History   Socioeconomic History   Marital status: Married    Spouse name: Not on file   Number of children: 2   Years of education: Not on file   Highest education level: Not on file  Occupational History    Employer: [employer]  Tobacco Use   Smoking status: Former    Packs/day: 1.00    Years: 7.00    Pack years: 7.00    Types: Cigarettes    Start date: 05/19/2003    Quit date: 08/06/2010    Years since quitting: 10.8   Smokeless tobacco: Never  Vaping Use   Vaping Use: Never used  Substance and Sexual Activity   Alcohol use: No    Alcohol/week: 0.0 standard drinks   Drug use: No   Sexual activity: Not on file  Other Topics Concern   Not on file  Social History Narrative   [place]  Works at [employer] on [employer]nsecurity: Not on file  Transportation Needs: Not on file  Physical Activity: Not on file  Stress: Not on file  Social Connections: Not on file  Intimate Partner Violence: Not on file    Family History  Problem Relation Age of Onset   CAD Father 5026 Diabetes Father    Heart attack Father    Transient ischemic  attack Father    Vascular Disease Father    Diabetes Mother    Anemia Mother    Gout Mother    Aneurysm  Maternal Grandmother    Stroke Maternal Grandfather    Diabetes Paternal Grandmother    Stroke Paternal Grandfather    Diabetes Brother     ROS- All systems are reviewed and negative except as per the HPI above  Physical Exam: Vitals:   06/11/21 1423  Weight: 113.4 kg  Height: 6' 1"  (1.854 m)   Wt Readings from Last 3 Encounters:  06/11/21 113.4 kg  12/06/20 113.1 kg  11/15/20 112.3 kg    Labs: Lab Results  Component Value Date   NA 143 01/29/2020   K 4.9 01/29/2020   CL 105 01/29/2020   CO2 23 01/29/2020   GLUCOSE 113 (H) 01/29/2020   BUN 13 01/29/2020   CREATININE 0.89 01/29/2020   CALCIUM 9.6 01/29/2020   MG 2.1 01/11/2018   Lab Results  Component Value Date   INR 1.04 06/02/2017   Lab Results  Component Value Date   CHOL 161 11/25/2018   HDL 35 (L) 11/25/2018   LDLCALC 82 11/25/2018   TRIG 219 (H) 11/25/2018     GEN- The patient is well appearing, alert and oriented x 3 today.   Head- normocephalic, atraumatic Eyes-  Sclera clear, conjunctiva pink Ears- hearing intact Oropharynx- clear Neck- supple, no JVP Lymph- no cervical lymphadenopathy Lungs- Clear to ausculation bilaterally, normal work of breathing Heart- Regular rate and rhythm, no murmurs, rubs or gallops, PMI not laterally displaced GI- soft, NT, ND, + BS Extremities- no clubbing, cyanosis, or edema MS- no significant deformity or atrophy Skin- no rash or lesion Psych- euthymic mood, full affect Neuro- strength and sensation are intact  EKG-NSR at 62 bpm, pr int 150 ms, qrs int 94 ms, qtc 432 ms     Assessment and Plan:  1. Afib  S/p 2 ablations  Maintaining  SR on Tikosyn Continue Tikosyn 500 mcg bid  Bmet/mag  2. CHA2DS2VASc score of 3 Continue xarelto 20 mg daily  3. HTN Stable  F/u in 6 months   Geroge Baseman. Martie Fulgham, [facility] [address] [place], [place] [address] [phone]

## 2021-06-13 ENCOUNTER — Encounter (HOSPITAL_COMMUNITY): Payer: Self-pay | Admitting: *Deleted

## 2021-06-13 ENCOUNTER — Encounter (HOSPITAL_COMMUNITY): Payer: Self-pay

## 2021-06-13 ENCOUNTER — Other Ambulatory Visit (HOSPITAL_COMMUNITY): Payer: Self-pay | Admitting: *Deleted

## 2021-06-13 DIAGNOSIS — I480 Paroxysmal atrial fibrillation: Secondary | ICD-10-CM

## 2021-06-21 ENCOUNTER — Other Ambulatory Visit: Payer: Self-pay | Admitting: Family Medicine

## 2021-07-01 ENCOUNTER — Other Ambulatory Visit: Payer: Self-pay | Admitting: Family Medicine

## 2021-07-01 DIAGNOSIS — E119 Type 2 diabetes mellitus without complications: Secondary | ICD-10-CM

## 2021-07-07 ENCOUNTER — Other Ambulatory Visit: Payer: Self-pay | Admitting: Internal Medicine

## 2021-07-07 NOTE — Telephone Encounter (Signed)
Prescription refill request for Xarelto received.  ?Indication:  Atrial Fib ?Last office visit: 06/11/21 ?Weight: 113.4kg ?Age: 56 ?Scr: 1.23 on 06/11/21 ?CrCl: 105 ? ?Based on above findings Xarelto '20mg'$  daily is the appropriate dose.  Refill approved. ? ?

## 2021-07-08 ENCOUNTER — Other Ambulatory Visit: Payer: Self-pay | Admitting: Nurse Practitioner

## 2021-07-18 ENCOUNTER — Other Ambulatory Visit: Payer: Self-pay | Admitting: Nurse Practitioner

## 2021-07-31 ENCOUNTER — Telehealth (HOSPITAL_COMMUNITY): Payer: Self-pay

## 2021-07-31 MED ORDER — MAGNESIUM OXIDE 400 MG PO CAPS: ORAL_CAPSULE | ORAL | 9 refills | Status: DC

## 2021-07-31 NOTE — Telephone Encounter (Signed)
Contacted patient and he states he is currently taking Magnesium- Bone, muscle extra strength 416mg - 1 tablet twice daily OTC. Advised patient to stop the OTC Magnesium and start taking Magnesium Oxide 40103m - Taking one tablet by mouth twice daily.[facility] [place] [facility] and he verbalized understanding.  ?

## 2021-07-31 NOTE — Telephone Encounter (Signed)
-----   Message from Sherran Needs, NP sent at 07/30/2021  4:37 PM EDT ----- ?Please confirm that he is taking magnesium correctly. Mag is still low at 1.8, 1.7 last time.  ?

## 2021-12-07 ENCOUNTER — Other Ambulatory Visit: Payer: Self-pay | Admitting: Internal Medicine

## 2021-12-09 NOTE — Telephone Encounter (Signed)
This is a A-Fib clinic pt 

## 2021-12-23 ENCOUNTER — Other Ambulatory Visit: Payer: Self-pay | Admitting: Nurse Practitioner

## 2021-12-23 DIAGNOSIS — R059 Cough, unspecified: Secondary | ICD-10-CM | POA: Diagnosis not present

## 2021-12-23 DIAGNOSIS — J019 Acute sinusitis, unspecified: Secondary | ICD-10-CM | POA: Diagnosis not present

## 2021-12-23 DIAGNOSIS — Z20828 Contact with and (suspected) exposure to other viral communicable diseases: Secondary | ICD-10-CM | POA: Diagnosis not present

## 2022-01-08 DIAGNOSIS — K219 Gastro-esophageal reflux disease without esophagitis: Secondary | ICD-10-CM | POA: Diagnosis not present

## 2022-01-08 DIAGNOSIS — Z20828 Contact with and (suspected) exposure to other viral communicable diseases: Secondary | ICD-10-CM | POA: Diagnosis not present

## 2022-01-08 DIAGNOSIS — J019 Acute sinusitis, unspecified: Secondary | ICD-10-CM | POA: Diagnosis not present

## 2022-01-08 DIAGNOSIS — Z9889 Other specified postprocedural states: Secondary | ICD-10-CM | POA: Diagnosis not present

## 2022-01-08 DIAGNOSIS — Z6832 Body mass index (BMI) 32.0-32.9, adult: Secondary | ICD-10-CM | POA: Diagnosis not present

## 2022-01-20 ENCOUNTER — Encounter (INDEPENDENT_AMBULATORY_CARE_PROVIDER_SITE_OTHER): Payer: Self-pay | Admitting: *Deleted

## 2022-01-21 DIAGNOSIS — Z23 Encounter for immunization: Secondary | ICD-10-CM | POA: Diagnosis not present

## 2022-01-22 DIAGNOSIS — K219 Gastro-esophageal reflux disease without esophagitis: Secondary | ICD-10-CM | POA: Diagnosis not present

## 2022-01-22 DIAGNOSIS — E119 Type 2 diabetes mellitus without complications: Secondary | ICD-10-CM | POA: Diagnosis not present

## 2022-01-22 DIAGNOSIS — Z13220 Encounter for screening for lipoid disorders: Secondary | ICD-10-CM | POA: Diagnosis not present

## 2022-01-28 DIAGNOSIS — Z9889 Other specified postprocedural states: Secondary | ICD-10-CM | POA: Diagnosis not present

## 2022-01-28 DIAGNOSIS — E559 Vitamin D deficiency, unspecified: Secondary | ICD-10-CM | POA: Diagnosis not present

## 2022-01-28 DIAGNOSIS — Z1389 Encounter for screening for other disorder: Secondary | ICD-10-CM | POA: Diagnosis not present

## 2022-01-28 DIAGNOSIS — E119 Type 2 diabetes mellitus without complications: Secondary | ICD-10-CM | POA: Diagnosis not present

## 2022-01-28 DIAGNOSIS — Z1331 Encounter for screening for depression: Secondary | ICD-10-CM | POA: Diagnosis not present

## 2022-01-28 DIAGNOSIS — K219 Gastro-esophageal reflux disease without esophagitis: Secondary | ICD-10-CM | POA: Diagnosis not present

## 2022-01-28 DIAGNOSIS — Z8679 Personal history of other diseases of the circulatory system: Secondary | ICD-10-CM | POA: Diagnosis not present

## 2022-01-28 DIAGNOSIS — I480 Paroxysmal atrial fibrillation: Secondary | ICD-10-CM | POA: Diagnosis not present

## 2022-01-30 ENCOUNTER — Other Ambulatory Visit: Payer: Self-pay | Admitting: Internal Medicine

## 2022-01-30 DIAGNOSIS — I4819 Other persistent atrial fibrillation: Secondary | ICD-10-CM

## 2022-01-30 NOTE — Telephone Encounter (Signed)
Xarelto '20mg'$  refill request received. Pt is 56 years old, weight-113.4kg, Crea-1.23 on 06/11/2021, last seen by Roderic Palau on 06/11/2021, Diagnosis-Afib, CrCl- 105 mL/min; Dose is appropriate based on dosing criteria. Will send in refill to requested pharmacy.

## 2022-02-03 ENCOUNTER — Ambulatory Visit (HOSPITAL_COMMUNITY)
Admission: RE | Admit: 2022-02-03 | Discharge: 2022-02-03 | Disposition: A | Discharge status: Home / Self Care | Payer: BC Managed Care – PPO | Source: Ambulatory Visit | Attending: Nurse Practitioner | Admitting: Nurse Practitioner

## 2022-02-03 ENCOUNTER — Encounter (HOSPITAL_COMMUNITY): Payer: Self-pay | Admitting: Nurse Practitioner

## 2022-02-03 VITALS — BP 160/84 | HR 63 | Ht 73.0 in | Wt 252.8 lb

## 2022-02-03 DIAGNOSIS — I10 Essential (primary) hypertension: Secondary | ICD-10-CM | POA: Diagnosis not present

## 2022-02-03 DIAGNOSIS — D6869 Other thrombophilia: Secondary | ICD-10-CM | POA: Diagnosis not present

## 2022-02-03 DIAGNOSIS — I4819 Other persistent atrial fibrillation: Secondary | ICD-10-CM | POA: Insufficient documentation

## 2022-02-03 DIAGNOSIS — I4891 Unspecified atrial fibrillation: Secondary | ICD-10-CM | POA: Diagnosis not present

## 2022-02-03 NOTE — Progress Notes (Signed)
Primary Care Physician: Wanita Chamberlain, PA-C Referring Physician:Dr. Garlan Fillers is a 56 y.o. male with a h/o afib x 2 ablations, and maintaining SR on Tikosyn. He is here for Tikosyn surveillance. Qtc is stable. He is on xarelto for a CHA2DS2VASc  score of 3.  F/u in afib clinic 02/03/22 for Tikosyn surveillance. He is in SR. He just had labs checked, his K+ was good at 4.6 but his mag was low at 1.4. He had stopped taking his mag but has just started it back. Will recheck in 10 days. Otherwise, no complaints today. Qt is stable. He does live in [place] and would appreciate seeing Dr. Myles Gip there in Dr. Jackalyn Lombard absence for future Tikosyn surveillance visits.   Today, he denies symptoms of palpitations, chest pain, shortness of breath, orthopnea, PND, lower extremity edema, dizziness, presyncope, syncope, or neurologic sequela. The patient is tolerating medications without difficulties and is otherwise without complaint today.   Past Medical History:  Diagnosis Date   A-fib ([place])    Anemia    iron def anemia   Arthritis    Asthma    Coronary artery disease    Dysrhythmia    in and out Afib   GERD (gastroesophageal reflux disease)    Headache    High cholesterol    History of DVT (deep vein thrombosis)    a. left leg following ablation for SVT   Hypertension    Persistent atrial fibrillation ([place])    a. Dx 08/2012, xarelto initiated; b. 09/2012 Tikosyn initiated -> DCCV -> Sinus c. PVI 03/2013 d. PVI 03/2014   Sleep apnea    USES  CPAP    Type 2 diabetes mellitus without complication, without long-term current use of insulin ([place]) 02/16/2018   Wolff-Parkinson-White (WPW) syndrome    resolved s/p RFCA 2001 by Dr Caryl Comes   Past Surgical History:  Procedure Laterality Date   ATRIAL FIBRILLATION ABLATION  03/23/2013   PVI by DR Rayann Heman for afib   ATRIAL FIBRILLATION ABLATION N/A 03/23/2013   Procedure: ATRIAL FIBRILLATION ABLATION;  Surgeon: Coralyn Mark, MD;   Location: [facility];  Service: Cardiovascular;  Laterality: N/A;   ATRIAL FIBRILLATION ABLATION N/A 03/27/2014   PVI Dr Norman Herrlich SURGERY     BIOPSY  02/28/2020   Procedure: BIOPSY;  Surgeon: Rogene Houston, MD;  [facility];  Service: Endoscopy;;  antral(pre-pyloric)    CARDIAC CATHETERIZATION N/A 02/03/2016   Procedure: Left Heart Cath and Coronary Angiography;  Surgeon: Nelva Bush, MD;  Location: [facility];  Service: Cardiovascular;  Laterality: N/A;   CARDIAC CATHETERIZATION N/A 02/03/2016   Procedure: Intravascular Pressure Wire/FFR Study;  Surgeon: Nelva Bush, MD;  Location: [facility];  Service: Cardiovascular;  Laterality: N/A;   CARDIAC CATHETERIZATION N/A 02/03/2016   Procedure: Coronary Stent Intervention;  Surgeon: Nelva Bush, MD;  Location: [facility];  Service: Cardiovascular;  Laterality: N/A;   CARDIAC SURGERY     CARDIOVERSION N/A 08/25/2012   Procedure: CARDIOVERSION;  Surgeon: Thayer Headings, MD;  Location: [facility] ENDOSCOPY;  Service: Cardiovascular;  Laterality: N/A;   CARDIOVERSION N/A 09/06/2012   Procedure: CARDIOVERSION/Bedside;  Surgeon: Carlena Bjornstad, MD;  Location: [facility];  Service: Cardiovascular;  Laterality: N/A;   CARDIOVERSION N/A 01/12/2014   Procedure: CARDIOVERSION;  Surgeon: Pixie Casino, MD;  Location: [facility] ENDOSCOPY;  Service: Cardiovascular;  Laterality: N/A;   CARDIOVERSION N/A 01/30/2016   Procedure:  CARDIOVERSION;  Surgeon: Sueanne Margarita, MD;  Location: [facility] ENDOSCOPY;  Service: Cardiovascular;  Laterality: N/A;   CARDIOVERSION N/A 04/14/2016   Procedure: CARDIOVERSION;  Surgeon: Pixie Casino, MD;  Location: [facility] ENDOSCOPY;  Service: Cardiovascular;  Laterality: N/A;   COLONOSCOPY N/A 12/04/2015   Procedure: COLONOSCOPY;  Surgeon: Rogene Houston, MD;  [facility];  Service: Endoscopy;  Laterality: N/A;  730   COLONOSCOPY WITH PROPOFOL N/A 04/28/2019   Procedure: COLONOSCOPY WITH  PROPOFOL;  Surgeon: Rogene Houston, MD;  [facility];  Service: Endoscopy;  Laterality: N/A;   CORONARY ANGIOPLASTY     CORONARY ANGIOPLASTY WITH STENT PLACEMENT     ELECTROPHYSIOLOGIC STUDY N/A 02/18/2016   Procedure: Atrial Fibrillation Ablation;  Surgeon: Thompson Grayer, MD;  Location: [facility] CV LAB;  Service: Cardiovascular;  Laterality: N/A;   ESOPHAGOGASTRODUODENOSCOPY (EGD) WITH PROPOFOL N/A 04/28/2019   Procedure: ESOPHAGOGASTRODUODENOSCOPY (EGD) WITH PROPOFOL;  Surgeon: Rogene Houston, MD;  [facility];  Service: Endoscopy;  Laterality: N/A;  7:30   ESOPHAGOGASTRODUODENOSCOPY (EGD) WITH PROPOFOL N/A 02/28/2020   Procedure: ESOPHAGOGASTRODUODENOSCOPY (EGD) WITH PROPOFOL;  Surgeon: Rogene Houston, MD;  [facility];  Service: Endoscopy;  Laterality: N/A;  1245   implantable loop recorder removal  03/01/2019   MDT Reveal LINQ removed in office   INGUINAL HERNIA REPAIR Bilateral 06/04/2017   Procedure: OPEN BILATERAL INGUINAL HERNIA REPAIR;  Surgeon: Judeth Horn, MD;  Location: [place];  Service: General;  Laterality: Bilateral;   LOOP RECORDER IMPLANT N/A 06/29/2014   Procedure: LOOP RECORDER IMPLANT;  Surgeon: Thompson Grayer, MD;  Location: [facility] CATH LAB;  Service: Cardiovascular;  Laterality: N/A;   NOSE SURGERY     sleep apnea surgery   PERIPHERAL VASCULAR CATHETERIZATION N/A 03/13/2016   Procedure: Thrombin Injection;  Surgeon: Serafina Mitchell, MD;  Location: [facility] CV LAB;  Service: Cardiovascular;  Laterality: N/A;   POLYPECTOMY  04/28/2019   Procedure: POLYPECTOMY;  Surgeon: Rogene Houston, MD;  [facility];  Service: Endoscopy;;  duodenum;splenic flexure;   STOMACH SURGERY     reflux, fundoplication   TEE WITHOUT CARDIOVERSION N/A 08/25/2012   Procedure: TRANSESOPHAGEAL ECHOCARDIOGRAM (TEE);  Surgeon: Thayer Headings, MD;  Location: [facility];  Service: Cardiovascular;  Laterality: N/A;   TEE WITHOUT CARDIOVERSION   03/23/2013   DR ROSS   TEE WITHOUT CARDIOVERSION N/A 03/23/2013   Procedure: TRANSESOPHAGEAL ECHOCARDIOGRAM (TEE);  Surgeon: Fay Records, MD;  Location: [facility] ENDOSCOPY;  Service: Cardiovascular;  Laterality: N/A;   TEE WITHOUT CARDIOVERSION N/A 03/26/2014   Procedure: TRANSESOPHAGEAL ECHOCARDIOGRAM (TEE);  Surgeon: Josue Hector, MD;  Location: [facility] ENDOSCOPY;  Service: Cardiovascular;  Laterality: N/A;   TONSILLECTOMY      Current Outpatient Medications  Medication Sig Dispense Refill   albuterol (VENTOLIN HFA) 108 (90 Base) MCG/ACT inhaler Inhale 2 puffs into the lungs every 6 (six) hours as needed for wheezing or shortness of breath. 8 g 0   atorvastatin (LIPITOR) 10 MG tablet TAKE 1 TABLET BY MOUTH  DAILY 60 tablet 2   atorvastatin (LIPITOR) 10 MG tablet 1 tablet Orally Once a day     azelastine (ASTELIN) 0.1 % nasal spray 1 puff in each nostril Nasally Twice a day     blood glucose meter kit and supplies Dispense based on patient and insurance preference. Use to check blood sugar once a day(FOR ICD-10 E10.9, E11.9). 1 each 0   Blood Glucose Monitoring Suppl (CONTOUR NEXT MONITOR)  w/Device KIT as directed per package instructions twice a day     Cholecalciferol (VITAMIN D) 2000 UNITS CAPS Take 4,000 Units by mouth daily.      Cyanocobalamin (VITAMIN B 12) 100 MCG LOZG Take 1 tablet by mouth every morning.     dofetilide (TIKOSYN) 500 MCG capsule TAKE 1 CAPSULE BY MOUTH TWICE  DAILY. Appointment Required For Further Refills [phone] 180 capsule 0   enalapril (VASOTEC) 10 MG tablet Take 1 tablet (10 mg total) by mouth 2 (two) times daily. 180 tablet 3   esomeprazole (NEXIUM) 40 MG capsule Take 1 capsule (40 mg total) by mouth 2 (two) times daily before a meal. NEEDS OFFICE VISIT 60 capsule 0   fluticasone (FLONASE) 50 MCG/ACT nasal spray Place 1 spray into both nostrils daily as needed for allergies.      Fluticasone-Salmeterol (ADVAIR DISKUS) 250-50 MCG/DOSE AEPB Inhale 1 puff into the  lungs every 12 (twelve) hours. 60 each 0   furosemide (LASIX) 40 MG tablet Take 1 tablet (40 mg total) by mouth as needed.     glucose blood (CONTOUR NEXT TEST) test strip as directed per package instructions In Vitro twice a day     hydrOXYzine (VISTARIL) 25 MG capsule Take 2 capsules (50 mg total) by mouth 3 (three) times daily as needed for anxiety. 30 capsule 0   loratadine (CLARITIN) 10 MG tablet Take 10 mg by mouth daily as needed for allergies.      Magnesium Oxide 400 MG CAPS Take one tablet by mouth twice daily 60 capsule 9   metFORMIN (GLUCOPHAGE) 1000 MG tablet TAKE 1 TABLET BY MOUTH TWICE  DAILY WITH MEALS 30 tablet 0   metoprolol tartrate (LOPRESSOR) 50 MG tablet Take 50 mg by mouth daily.     nitroGLYCERIN (NITROSTAT) 0.4 MG SL tablet DISSOLVE 1 TABLET UNDER THE TONGUE EVERY 5 MINUTES AS  NEEDED FOR CHEST PAIN. MAX  OF 3 TABLETS IN 15 MINUTES. CALL 911 IF PAIN PERSISTS. 25 tablet 1   oxybutynin (DITROPAN) 5 MG tablet TAKE ONE-HALF TABLET (2.64m) BY  MOUTH TWO TIMES DAILY AS  NEEDED FOR BLADDER SPASMS (Patient not taking: Reported on 06/11/2021) 30 tablet 5   potassium chloride SA (KLOR-CON) 20 MEQ tablet TAKE 1 TABLET BY MOUTH  DAILY 90 tablet 3   rivaroxaban (XARELTO) 20 MG TABS tablet TAKE 1 TABLET BY MOUTH ONCE DAILY WITH SUPPER 30 tablet 5   vitamin B-12 (CYANOCOBALAMIN) 500 MCG tablet Take 500 mcg by mouth daily.     No current facility-administered medications for this encounter.    No Known Allergies  Social History   Socioeconomic History   Marital status: Married    Spouse name: Not on file   Number of children: 2   Years of education: Not on file   Highest education level: Not on file  Occupational History    Employer: [employer]  Tobacco Use   Smoking status: Former    Packs/day: 1.00    Years: 7.00    Total pack years: 7.00    Types: Cigarettes    Start date: 05/19/2003    Quit date: 08/06/2010    Years since quitting: 11.5   Smokeless tobacco: Never  Vaping  Use   Vaping Use: Never used  Substance and Sexual Activity   Alcohol use: No    Alcohol/week: 0.0 standard drinks of alcohol   Drug use: No   Sexual activity: Not on file  Other Topics Concern   Not on file  Social History Narrative   Pt lives in [place] with wife.  Works at [employer] of [employer]: Not on [employer] Insecurity: Not on file  Transportation Needs: Not on file  Physical Activity: Not on file  Stress: Not on file  Social Connections: Not on file  Intimate Partner Violence: Not on file    Family History  Problem Relation Age of Onset   CAD Father 68   Diabetes Father    Heart attack Father    Transient ischemic attack Father    Vascular Disease Father    Diabetes Mother    Anemia Mother    Gout Mother    Aneurysm Maternal Grandmother    Stroke Maternal Grandfather    Diabetes Paternal Grandmother    Stroke Paternal Grandfather    Diabetes Brother     ROS- All systems are reviewed and negative except as per the HPI above  Physical Exam: There were no vitals filed for this visit.  Wt Readings from Last 3 Encounters:  06/11/21 113.4 kg  12/06/20 113.1 kg  11/15/20 112.3 kg    Labs: Lab Results  Component Value Date   NA 137 06/11/2021   K 4.5 06/11/2021   CL 103 06/11/2021   CO2 25 06/11/2021   GLUCOSE 123 (H) 06/11/2021   BUN 20 06/11/2021   CREATININE 1.23 06/11/2021   CALCIUM 9.1 06/11/2021   MG 1.7 06/11/2021   Lab Results  Component Value Date   INR 1.04 06/02/2017   Lab Results  Component Value Date   CHOL 161 11/25/2018   HDL 35 (L) 11/25/2018   LDLCALC 82 11/25/2018   TRIG 219 (H) 11/25/2018     GEN- The patient is well appearing, alert and oriented x 3 today.   Head- normocephalic, atraumatic Eyes-  Sclera clear, conjunctiva pink Ears- hearing intact Oropharynx- clear Neck- supple, no JVP Lymph- no cervical lymphadenopathy Lungs- Clear to ausculation bilaterally, normal work  of breathing Heart- Regular rate and rhythm, no murmurs, rubs or gallops, PMI not laterally displaced GI- soft, NT, ND, + BS Extremities- no clubbing, cyanosis, or edema MS- no significant deformity or atrophy Skin- no rash or lesion Psych- euthymic mood, full affect Neuro- strength and sensation are intact  EKG-Vent. rate 63 BPM PR interval 144 ms QRS duration 100 ms QT/QTcB 442/452 ms P-R-T axes 46 6 11 Normal sinus rhythm Increased R/S ratio in V1, consider early transition or posterior infarct Abnormal ECG When compared with ECG of 11-Jun-2021 14:51, PREVIOUS ECG IS PRESENT    Assessment and Plan:  1. Afib  S/p 2 ablations  Maintaining  SR on Tikosyn Continue Tikosyn 500 mcg bid  He will need a f/u Mag level in 10 days Pt will have drawn and sent here  2. CHA2DS2VASc score of 3 Continue xarelto 20 mg daily  3. HTN Elevated today Check  at home Avoid salt   F/u in 6 months   Butch Penny C. Kiera Hussey, [facility] [address] [place], [place] [address] [phone]

## 2022-03-11 DIAGNOSIS — Z20828 Contact with and (suspected) exposure to other viral communicable diseases: Secondary | ICD-10-CM | POA: Diagnosis not present

## 2022-03-11 DIAGNOSIS — J019 Acute sinusitis, unspecified: Secondary | ICD-10-CM | POA: Diagnosis not present

## 2022-03-11 DIAGNOSIS — Z6832 Body mass index (BMI) 32.0-32.9, adult: Secondary | ICD-10-CM | POA: Diagnosis not present

## 2022-03-11 DIAGNOSIS — R059 Cough, unspecified: Secondary | ICD-10-CM | POA: Diagnosis not present

## 2022-03-16 DIAGNOSIS — J069 Acute upper respiratory infection, unspecified: Secondary | ICD-10-CM | POA: Diagnosis not present

## 2022-03-16 DIAGNOSIS — Z6832 Body mass index (BMI) 32.0-32.9, adult: Secondary | ICD-10-CM | POA: Diagnosis not present

## 2022-03-16 DIAGNOSIS — R059 Cough, unspecified: Secondary | ICD-10-CM | POA: Diagnosis not present

## 2022-03-16 DIAGNOSIS — H6502 Acute serous otitis media, left ear: Secondary | ICD-10-CM | POA: Diagnosis not present

## 2022-03-23 ENCOUNTER — Encounter (INDEPENDENT_AMBULATORY_CARE_PROVIDER_SITE_OTHER): Payer: Self-pay | Admitting: Gastroenterology

## 2022-03-23 ENCOUNTER — Ambulatory Visit (INDEPENDENT_AMBULATORY_CARE_PROVIDER_SITE_OTHER): Payer: BC Managed Care – PPO | Admitting: Gastroenterology

## 2022-03-23 ENCOUNTER — Telehealth (INDEPENDENT_AMBULATORY_CARE_PROVIDER_SITE_OTHER): Payer: Self-pay | Admitting: *Deleted

## 2022-03-23 VITALS — BP 110/66 | HR 83 | Temp 97.5°F | Ht 73.0 in | Wt 246.6 lb

## 2022-03-23 DIAGNOSIS — K219 Gastro-esophageal reflux disease without esophagitis: Secondary | ICD-10-CM

## 2022-03-23 NOTE — Patient Instructions (Signed)
Schedule EGD If pursuing possible TIF, will need to proceed with pH impedance testing and manometry in the future Continue Nexium 40 mg twice a day, if not improving symptoms after a couple of months we can try another medication Can take Pepcid OTC as needed for breakthrough episodes of heartburn Discussed avoidance of eating within 2 hours of lying down to sleep and benefit of blocks to elevate head of bed. Also, will benefit from avoiding carbonated drinks/sodas or food that has tomatoes, spicy or greasy food.

## 2022-03-23 NOTE — Progress Notes (Unsigned)
Katrinka Blazing, M.D. Gastroenterology & Hepatology [facility] [address] [place], [place] [address]  Primary Care Physician: Dion Saucier, PA-C [address] [place] [address]  I will communicate my assessment and recommendations to the referring MD via EMR.  Problems: GERD History of duodenal adenoma  History of Present Illness: Austin Woods is a 56 y.o. male with past medical history of A-fib, asthma, iron-deficiency anemia, GERD s/p Nissen fundoplication, hypertension, diabetes, Wolff-Parkinson-White status post ablation, coronary artery disease, OSA, who presents for follow up of GERD.  The patient was last seen on 02/05/2020. At that time, the patient was advised to increase his pantoprazole to 40 mg twice a day.  States that he feels that he is presenting frequent regurgitation and heartburn in his chest, almost on a daily basis. States his symptoms are worse during the night as he reports he has had to sleep with three pillows. He reports he has had persistent dry cough for the last 6-8 weeks, which he thinks may be related to his uncontrolled GERD. He reports he had a Nissen as the cough was so severe it led to syncopal episodes. He is taking esomeprazole BID, usually takes it 2 hours before his meals.  He endorses his abdomen"feels tight" all the time but denies any abdominal pain.  The patient denies having any regular nausea, vomiting, fever, chills, dysphagia, hematochezia, melena, hematemesis, abdominal pain, diarrhea, jaundice, pruritus or weight loss.  He had a Nissen fundoplication close to 20 years ago.  Last EGD: 02/2020 Normal hypopharynx. - Normal esophagus. - Z-line regular, 43 cm from the incisors. - Erosive gastropathy with no stigmata of recent bleeding. Biopsied. -Loose fundal wrap. - Normal duodenal bulb and third portion of the duodenum. -No evidence of residual or recurrent duodenal  polyp.  Path: ANTRAL, PRE-PYLORIC EROSIONS, BIOPSY:  - Gastric antral mucosa showing marked nonspecific reactive gastropathy  with erosion  - Warthin Starry stain is negative for Helicobacter pylori   Last Colonoscopy: 04/2019 - Perianal skin tags found on perianal exam. - The examined portion of the ileum was normal. - Two diminutive polyps at the splenic flexure and in the ascending colon. Biopsied. - Diverticulosis at the hepatic flexure. - External hemorrhoids.  Path: COLON, SPLENIC FLEXURE, ASCENDING, POLYPECTOMY:  - Tubular adenoma without high-grade dysplasia or malignancy  - Other fragment of polypoid colonic mucosa with no specific  histopathologic changes   Recommended repeat in 7 years.  Past Medical History: Past Medical History:  Diagnosis Date   A-fib (HCC)    Anemia    iron def anemia   Arthritis    Asthma    Coronary artery disease    Dysrhythmia    in and out Afib   GERD (gastroesophageal reflux disease)    Headache    High cholesterol    History of DVT (deep vein thrombosis)    a. left leg following ablation for SVT   Hypertension    Persistent atrial fibrillation (HCC)    a. Dx 08/2012, xarelto initiated; b. 09/2012 Tikosyn initiated -> DCCV -> Sinus c. PVI 03/2013 d. PVI 03/2014   Sleep apnea    USES  CPAP    Type 2 diabetes mellitus without complication, without long-term current use of insulin (HCC) 02/16/2018   Wolff-Parkinson-White (WPW) syndrome    resolved s/p RFCA 2001 by Dr Graciela Husbands    Past Surgical History: Past Surgical History:  Procedure Laterality Date   ATRIAL FIBRILLATION ABLATION  03/23/2013  PVI by DR Johney Frame for afib   ATRIAL FIBRILLATION ABLATION N/A 03/23/2013   Procedure: ATRIAL FIBRILLATION ABLATION;  Surgeon: Gardiner Rhyme, MD;  Location: [facility] CATH LAB;  Service: Cardiovascular;  Laterality: N/A;   ATRIAL FIBRILLATION ABLATION N/A 03/27/2014   PVI Dr Allyne Gee SURGERY     BIOPSY  02/28/2020   Procedure: BIOPSY;   Surgeon: Malissa Hippo, MD;  [facility]: [facility] ENDO [facility];  Service: Endoscopy;;  antral(pre-pyloric)    CARDIAC CATHETERIZATION N/A 02/03/2016   Procedure: Left Heart Cath and Coronary Angiography;  Surgeon: Yvonne Kendall, MD;  Location: [facility] INVASIVE CV LAB;  Service: Cardiovascular;  Laterality: N/A;   CARDIAC CATHETERIZATION N/A 02/03/2016   Procedure: Intravascular Pressure Wire/FFR Study;  Surgeon: Yvonne Kendall, MD;  Location: [facility] INVASIVE CV LAB;  Service: Cardiovascular;  Laterality: N/A;   CARDIAC CATHETERIZATION N/A 02/03/2016   Procedure: Coronary Stent Intervention;  Surgeon: Yvonne Kendall, MD;  Location: [facility] INVASIVE CV LAB;  Service: Cardiovascular;  Laterality: N/A;   CARDIAC SURGERY     CARDIOVERSION N/A 08/25/2012   Procedure: CARDIOVERSION;  Surgeon: Vesta Mixer, MD;  Location: [facility] ENDOSCOPY;  Service: Cardiovascular;  Laterality: N/A;   CARDIOVERSION N/A 09/06/2012   Procedure: CARDIOVERSION/Bedside;  Surgeon: Luis Abed, MD;  Location: [facility] OR;  Service: Cardiovascular;  Laterality: N/A;   CARDIOVERSION N/A 01/12/2014   Procedure: CARDIOVERSION;  Surgeon: Chrystie Nose, MD;  Location: [facility] ENDOSCOPY;  Service: Cardiovascular;  Laterality: N/A;   CARDIOVERSION N/A 01/30/2016   Procedure: CARDIOVERSION;  Surgeon: Quintella Reichert, MD;  Location: [facility] ENDOSCOPY;  Service: Cardiovascular;  Laterality: N/A;   CARDIOVERSION N/A 04/14/2016   Procedure: CARDIOVERSION;  Surgeon: Chrystie Nose, MD;  Location: [facility] ENDOSCOPY;  Service: Cardiovascular;  Laterality: N/A;   COLONOSCOPY N/A 12/04/2015   Procedure: COLONOSCOPY;  Surgeon: Malissa Hippo, MD;  [facility];  Service: Endoscopy;  Laterality: N/A;  730   COLONOSCOPY WITH PROPOFOL N/A 04/28/2019   Procedure: COLONOSCOPY WITH PROPOFOL;  Surgeon: Malissa Hippo, MD;  [facility];  Service: Endoscopy;  Laterality: N/A;   CORONARY ANGIOPLASTY     CORONARY ANGIOPLASTY WITH STENT PLACEMENT      ELECTROPHYSIOLOGIC STUDY N/A 02/18/2016   Procedure: Atrial Fibrillation Ablation;  Surgeon: Hillis Range, MD;  Location: [facility] INVASIVE CV LAB;  Service: Cardiovascular;  Laterality: N/A;   ESOPHAGOGASTRODUODENOSCOPY (EGD) WITH PROPOFOL N/A 04/28/2019   Procedure: ESOPHAGOGASTRODUODENOSCOPY (EGD) WITH PROPOFOL;  Surgeon: Malissa Hippo, MD;  [facility];  Service: Endoscopy;  Laterality: N/A;  7:30   ESOPHAGOGASTRODUODENOSCOPY (EGD) WITH PROPOFOL N/A 02/28/2020   Procedure: ESOPHAGOGASTRODUODENOSCOPY (EGD) WITH PROPOFOL;  Surgeon: Malissa Hippo, MD;  [facility];  Service: Endoscopy;  Laterality: N/A;  1245   implantable loop recorder removal  03/01/2019   MDT Reveal LINQ removed in office   INGUINAL HERNIA REPAIR Bilateral 06/04/2017   Procedure: OPEN BILATERAL INGUINAL HERNIA REPAIR;  Surgeon: Jimmye Norman, MD;  Location: [facility] OR;  Service: General;  Laterality: Bilateral;   LOOP RECORDER IMPLANT N/A 06/29/2014   Procedure: LOOP RECORDER IMPLANT;  Surgeon: Hillis Range, MD;  Location: [facility] CATH LAB;  Service: Cardiovascular;  Laterality: N/A;   NOSE SURGERY     sleep apnea surgery   PERIPHERAL VASCULAR CATHETERIZATION N/A 03/13/2016   Procedure: Thrombin Injection;  Surgeon: Nada Libman, MD;  Location: [facility] INVASIVE CV LAB;  Service: Cardiovascular;  Laterality: N/A;   POLYPECTOMY  04/28/2019   Procedure: POLYPECTOMY;  Surgeon: Malissa Hippo, MD;  Location: [facility];  Service: Endoscopy;;  duodenum;splenic flexure;   STOMACH SURGERY     reflux, fundoplication   TEE WITHOUT CARDIOVERSION N/A 08/25/2012   Procedure: TRANSESOPHAGEAL ECHOCARDIOGRAM (TEE);  Surgeon: Vesta Mixer, MD;  Location: [facility] ENDOSCOPY;  Service: Cardiovascular;  Laterality: N/A;   TEE WITHOUT CARDIOVERSION  03/23/2013   DR ROSS   TEE WITHOUT CARDIOVERSION N/A 03/23/2013   Procedure: TRANSESOPHAGEAL ECHOCARDIOGRAM (TEE);  Surgeon: Pricilla Riffle, MD;  Location: [facility] ENDOSCOPY;  Service:  Cardiovascular;  Laterality: N/A;   TEE WITHOUT CARDIOVERSION N/A 03/26/2014   Procedure: TRANSESOPHAGEAL ECHOCARDIOGRAM (TEE);  Surgeon: Wendall Stade, MD;  Location: [facility] ENDOSCOPY;  Service: Cardiovascular;  Laterality: N/A;   TONSILLECTOMY      Family History: Family History  Problem Relation Age of Onset   CAD Father 70   Diabetes Father    Heart attack Father    Transient ischemic attack Father    Vascular Disease Father    Diabetes Mother    Anemia Mother    Gout Mother    Aneurysm Maternal Grandmother    Stroke Maternal Grandfather    Diabetes Paternal Grandmother    Stroke Paternal Grandfather    Diabetes Brother     Social History: Social History   Tobacco Use  Smoking Status Former   Packs/day: 1.00   Years: 7.00   Total pack years: 7.00   Types: Cigarettes   Start date: 05/19/2003   Quit date: 08/06/2010   Years since quitting: 11.6  Smokeless Tobacco Never   Social History   Substance and Sexual Activity  Alcohol Use No   Alcohol/week: 0.0 standard drinks of alcohol   Social History   Substance and Sexual Activity  Drug Use No    Allergies: No Known Allergies  Medications: Current Outpatient Medications  Medication Sig Dispense Refill   albuterol (VENTOLIN HFA) 108 (90 Base) MCG/ACT inhaler Inhale 2 puffs into the lungs every 6 (six) hours as needed for wheezing or shortness of breath. 8 g 0   atorvastatin (LIPITOR) 20 MG tablet Take 20 mg by mouth daily.     azelastine (ASTELIN) 0.1 % nasal spray 1 puff in each nostril Nasally Twice a day     blood glucose meter kit and supplies Dispense based on patient and insurance preference. Use to check blood sugar once a day(FOR ICD-10 E10.9, E11.9). 1 each 0   Blood Glucose Monitoring Suppl (CONTOUR NEXT MONITOR) w/Device KIT as directed per package instructions twice a day     Cholecalciferol (VITAMIN D) 2000 UNITS CAPS Take 4,000 Units by mouth daily.      dofetilide (TIKOSYN) 500 MCG capsule TAKE 1  CAPSULE BY MOUTH TWICE  DAILY. Appointment Required For Further Refills [phone] 180 capsule 0   enalapril (VASOTEC) 10 MG tablet Take 1 tablet (10 mg total) by mouth 2 (two) times daily. 180 tablet 3   esomeprazole (NEXIUM) 40 MG capsule Take 1 capsule (40 mg total) by mouth 2 (two) times daily before a meal. NEEDS OFFICE VISIT 60 capsule 0   fluticasone (FLONASE) 50 MCG/ACT nasal spray Place 1 spray into both nostrils daily as needed for allergies.      Fluticasone-Salmeterol (ADVAIR DISKUS) 250-50 MCG/DOSE AEPB Inhale 1 puff into the lungs every 12 (twelve) hours. 60 each 0   furosemide (LASIX) 40 MG tablet Take 1 tablet (40 mg total) by mouth as needed.     glucose blood (CONTOUR NEXT TEST) test strip  as directed per package instructions In Vitro twice a day     hydrOXYzine (VISTARIL) 25 MG capsule Take 2 capsules (50 mg total) by mouth 3 (three) times daily as needed for anxiety. 30 capsule 0   loratadine (CLARITIN) 10 MG tablet Take 10 mg by mouth daily as needed for allergies.      Magnesium Oxide 400 MG CAPS Take one tablet by mouth twice daily (Patient taking differently: Take one tablet by mouth daily) 60 capsule 9   meloxicam (MOBIC) 15 MG tablet Take 15 mg by mouth as needed.     metFORMIN (GLUCOPHAGE) 1000 MG tablet TAKE 1 TABLET BY MOUTH TWICE  DAILY WITH MEALS 30 tablet 0   metoprolol tartrate (LOPRESSOR) 50 MG tablet Take 50 mg by mouth daily.     nitroGLYCERIN (NITROSTAT) 0.4 MG SL tablet DISSOLVE 1 TABLET UNDER THE TONGUE EVERY 5 MINUTES AS  NEEDED FOR CHEST PAIN. MAX  OF 3 TABLETS IN 15 MINUTES. CALL 911 IF PAIN PERSISTS. 25 tablet 1   oxybutynin (DITROPAN) 5 MG tablet TAKE ONE-HALF TABLET (2.5mg ) BY  MOUTH TWO TIMES DAILY AS  NEEDED FOR BLADDER SPASMS 30 tablet 5   potassium chloride SA (KLOR-CON) 20 MEQ tablet TAKE 1 TABLET BY MOUTH  DAILY 90 tablet 3   rivaroxaban (XARELTO) 20 MG TABS tablet TAKE 1 TABLET BY MOUTH ONCE DAILY WITH SUPPER 30 tablet 5   vitamin B-12  (CYANOCOBALAMIN) 500 MCG tablet Take 500 mcg by mouth daily.     No current facility-administered medications for this visit.    Review of Systems: GENERAL: negative for malaise, night sweats HEENT: No changes in hearing or vision, no nose bleeds or other nasal problems. NECK: Negative for lumps, goiter, pain and significant neck swelling RESPIRATORY: Negative for cough, wheezing CARDIOVASCULAR: Negative for chest pain, leg swelling, palpitations, orthopnea GI: SEE HPI MUSCULOSKELETAL: Negative for joint pain or swelling, back pain, and muscle pain. SKIN: Negative for lesions, rash PSYCH: Negative for sleep disturbance, mood disorder and recent psychosocial stressors. HEMATOLOGY Negative for prolonged bleeding, bruising easily, and swollen nodes. ENDOCRINE: Negative for cold or heat intolerance, polyuria, polydipsia and goiter. NEURO: negative for tremor, gait imbalance, syncope and seizures. The remainder of the review of systems is noncontributory.   Physical Exam: BP 110/66 (BP Location: Right Arm, Patient Position: Sitting, Cuff Size: Small)   Pulse 83   Temp (!) 97.5 F (36.4 C) (Temporal)   Ht 6\' 1"  (1.854 m)   Wt 246 lb 9.6 oz (111.9 kg)   BMI 32.53 kg/m  GENERAL: The patient is AO x3, in no acute distress. HEENT: Head is normocephalic and atraumatic. EOMI are intact. Mouth is well hydrated and without lesions. NECK: Supple. No masses LUNGS: Clear to auscultation. No presence of rhonchi/wheezing/rales. Adequate chest expansion HEART: RRR, normal s1 and s2. ABDOMEN: Soft, nontender, no guarding, no peritoneal signs, and nondistended. BS +. No masses. EXTREMITIES: Without any cyanosis, clubbing, rash, lesions or edema. NEUROLOGIC: AOx3, no focal motor deficit. SKIN: no jaundice, no rashes  Imaging/Labs: as above  I personally reviewed and interpreted the available labs, imaging and endoscopic files.  Impression and Plan: Austin Woods is a 56 y.o. male coming for  follow up of ***   All questions were answered.      Katrinka Blazing, MD Gastroenterology and Hepatology [facility] Gastroenterology

## 2022-03-23 NOTE — Telephone Encounter (Signed)
Pharmacy, can you please comment on how long Xarelto can be held for upcoming procedure?  Thank you! 

## 2022-03-23 NOTE — Telephone Encounter (Signed)
    03/23/22  [name] [birth year]  What type of surgery is being performed? EGD  When is surgery scheduled? TBD  What type of clearance is required (medical or pharmacy to hold medication or both? Medication   Are there any medications that need to be held prior to surgery and how long? Xarelto x 2 days  Name of physician performing surgery?  Dr. Maylon Peppers [facility] Gastroenterology at [facility] Phone: [phone] Fax: [phone]  Anethesia type (none, local, MAC, general)? MAC

## 2022-03-23 NOTE — Telephone Encounter (Signed)
Patient with diagnosis of afib on Xarelto for anticoagulation.    Procedure: EGD Date of procedure: TBD  CHA2DS2-VASc Score = 4  This indicates a 4.8% annual risk of stroke. The patient's score is based upon: CHF History: 1 HTN History: 1 Diabetes History: 1 Stroke History: 0 Vascular Disease History: 1 Age Score: 0 Gender Score: 0   CrCl 64m/min using adjusted body weight Platelet count 275K  Per office protocol, patient can hold Xarelto for 2 days prior to procedure as requested.    **This guidance is not considered finalized until pre-operative APP has relayed final recommendations.**

## 2022-03-23 NOTE — H&P (View-Only) (Signed)
Zita Ozimek Castaneda, M.D. Gastroenterology & Hepatology [facility] [address] [place], [place] [address]  Primary Care Physician: Boles, Alyssa H, PA-C [address] [place] [address]  I will communicate my assessment and recommendations to the referring MD via EMR.  Problems: GERD History of duodenal adenoma  History of Present Illness: Austin Woods is a 56 y.o. male with past medical history of A-fib, asthma, iron-deficiency anemia, GERD s/p Nissen fundoplication, hypertension, diabetes, Wolff-Parkinson-White status post ablation, coronary artery disease, OSA, who presents for follow up of GERD.  The patient was last seen on 02/05/2020. At that time, the patient was advised to increase his pantoprazole to 40 mg twice a day.  States that he feels that he is presenting frequent regurgitation and heartburn in his chest, almost on a daily basis. States his symptoms are worse during the night as he reports he has had to sleep with three pillows. He reports he has had persistent dry cough for the last 6-8 weeks, which he thinks may be related to his uncontrolled GERD. He reports he had a Nissen as the cough was so severe it led to syncopal episodes. He is taking esomeprazole BID, usually takes it 2 hours before his meals.  He endorses his abdomen"feels tight" all the time but denies any abdominal pain.  The patient denies having any regular nausea, vomiting, fever, chills, dysphagia, hematochezia, melena, hematemesis, abdominal pain, diarrhea, jaundice, pruritus or weight loss.  He had a Nissen fundoplication close to 20 years ago.  Last EGD: 02/2020 Normal hypopharynx. - Normal esophagus. - Z-line regular, 43 cm from the incisors. - Erosive gastropathy with no stigmata of recent bleeding. Biopsied. -Loose fundal wrap. - Normal duodenal bulb and third portion of the duodenum. -No evidence of residual or recurrent duodenal  polyp.  Path: ANTRAL, PRE-PYLORIC EROSIONS, BIOPSY:  - Gastric antral mucosa showing marked nonspecific reactive gastropathy  with erosion  - Warthin Starry stain is negative for Helicobacter pylori   Last Colonoscopy: 04/2019 - Perianal skin tags found on perianal exam. - The examined portion of the ileum was normal. - Two diminutive polyps at the splenic flexure and in the ascending colon. Biopsied. - Diverticulosis at the hepatic flexure. - External hemorrhoids.  Path: COLON, SPLENIC FLEXURE, ASCENDING, POLYPECTOMY:  - Tubular adenoma without high-grade dysplasia or malignancy  - Other fragment of polypoid colonic mucosa with no specific  histopathologic changes   Recommended repeat in 7 years.  Past Medical History: Past Medical History:  Diagnosis Date   A-fib (HCC)    Anemia    iron def anemia   Arthritis    Asthma    Coronary artery disease    Dysrhythmia    in and out Afib   GERD (gastroesophageal reflux disease)    Headache    High cholesterol    History of DVT (deep vein thrombosis)    a. left leg following ablation for SVT   Hypertension    Persistent atrial fibrillation (HCC)    a. Dx 08/2012, xarelto initiated; b. 09/2012 Tikosyn initiated -> DCCV -> Sinus c. PVI 03/2013 d. PVI 03/2014   Sleep apnea    USES  CPAP    Type 2 diabetes mellitus without complication, without long-term current use of insulin (HCC) 02/16/2018   Wolff-Parkinson-White (WPW) syndrome    resolved s/p RFCA 2001 by Dr Klein    Past Surgical History: Past Surgical History:  Procedure Laterality Date   ATRIAL FIBRILLATION ABLATION  03/23/2013     PVI by DR ALLRED for afib   ATRIAL FIBRILLATION ABLATION N/A 03/23/2013   Procedure: ATRIAL FIBRILLATION ABLATION;  Surgeon: Ebon D Allred, MD;  [facility]: [facility];  Service: Cardiovascular;  Laterality: N/A;   ATRIAL FIBRILLATION ABLATION N/A 03/27/2014   PVI Dr Allred   BACK SURGERY     BIOPSY  02/28/2020   Procedure: BIOPSY;   Surgeon: Rehman, Najeeb U, MD;  [facility]: [facility];  Service: Endoscopy;;  antral(pre-pyloric)    CARDIAC CATHETERIZATION N/A 02/03/2016   Procedure: Left Heart Cath and Coronary Angiography;  Surgeon: Christopher End, MD;  [facility]: [facility] INVASIVE CV LAB;  Service: Cardiovascular;  Laterality: N/A;   CARDIAC CATHETERIZATION N/A 02/03/2016   Procedure: Intravascular Pressure Wire/FFR Study;  Surgeon: Christopher End, MD;  [facility];  Service: Cardiovascular;  Laterality: N/A;   CARDIAC CATHETERIZATION N/A 02/03/2016   Procedure: Coronary Stent Intervention;  Surgeon: Christopher End, MD;  [facility];  Service: Cardiovascular;  Laterality: N/A;   CARDIAC SURGERY     CARDIOVERSION N/A 08/25/2012   Procedure: CARDIOVERSION;  Surgeon: Philip J Nahser, MD;  [facility]: [facility];  Service: Cardiovascular;  Laterality: N/A;   CARDIOVERSION N/A 09/06/2012   Procedure: CARDIOVERSION/Bedside;  Surgeon: Jeffrey D Katz, MD;  [facility]: [facility] OR;  Service: Cardiovascular;  Laterality: N/A;   CARDIOVERSION N/A 01/12/2014   Procedure: CARDIOVERSION;  Surgeon: Kenneth C. Hilty, MD;  [facility];  Service: Cardiovascular;  Laterality: N/A;   CARDIOVERSION N/A 01/30/2016   Procedure: CARDIOVERSION;  Surgeon: Traci R Turner, MD;  [facility];  Service: Cardiovascular;  Laterality: N/A;   CARDIOVERSION N/A 04/14/2016   Procedure: CARDIOVERSION;  Surgeon: Kenneth C Hilty, MD;  [facility];  Service: Cardiovascular;  Laterality: N/A;   COLONOSCOPY N/A 12/04/2015   Procedure: COLONOSCOPY;  Surgeon: Najeeb U Rehman, MD;  [facility];  Service: Endoscopy;  Laterality: N/A;  730   COLONOSCOPY WITH PROPOFOL N/A 04/28/2019   Procedure: COLONOSCOPY WITH PROPOFOL;  Surgeon: Rehman, Najeeb U, MD;  [facility];  Service: Endoscopy;  Laterality: N/A;   CORONARY ANGIOPLASTY     CORONARY ANGIOPLASTY WITH STENT PLACEMENT      ELECTROPHYSIOLOGIC STUDY N/A 02/18/2016   Procedure: Atrial Fibrillation Ablation;  Surgeon: Nikki Allred, MD;  [facility];  Service: Cardiovascular;  Laterality: N/A;   ESOPHAGOGASTRODUODENOSCOPY (EGD) WITH PROPOFOL N/A 04/28/2019   Procedure: ESOPHAGOGASTRODUODENOSCOPY (EGD) WITH PROPOFOL;  Surgeon: Rehman, Najeeb U, MD;  [facility];  Service: Endoscopy;  Laterality: N/A;  7:30   ESOPHAGOGASTRODUODENOSCOPY (EGD) WITH PROPOFOL N/A 02/28/2020   Procedure: ESOPHAGOGASTRODUODENOSCOPY (EGD) WITH PROPOFOL;  Surgeon: Rehman, Najeeb U, MD;  [facility];  Service: Endoscopy;  Laterality: N/A;  1245   implantable loop recorder removal  03/01/2019   MDT Reveal LINQ removed in office   INGUINAL HERNIA REPAIR Bilateral 06/04/2017   Procedure: OPEN BILATERAL INGUINAL HERNIA REPAIR;  Surgeon: Wyatt, Damir, MD;  [facility]: [facility];  Service: General;  Laterality: Bilateral;   LOOP RECORDER IMPLANT N/A 06/29/2014   Procedure: LOOP RECORDER IMPLANT;  Surgeon: Bently Allred, MD;  [facility];  Service: Cardiovascular;  Laterality: N/A;   NOSE SURGERY     sleep apnea surgery   PERIPHERAL VASCULAR CATHETERIZATION N/A 03/13/2016   Procedure: Thrombin Injection;  Surgeon: Vance W Brabham, MD;  [facility];  Service: Cardiovascular;  Laterality: N/A;   POLYPECTOMY  04/28/2019   Procedure: POLYPECTOMY;    Surgeon: Rehman, Najeeb U, MD;  [facility];  Service: Endoscopy;;  duodenum;splenic flexure;   STOMACH SURGERY     reflux, fundoplication   TEE WITHOUT CARDIOVERSION N/A 08/25/2012   Procedure: TRANSESOPHAGEAL ECHOCARDIOGRAM (TEE);  Surgeon: Philip J Nahser, MD;  [facility];  Service: Cardiovascular;  Laterality: N/A;   TEE WITHOUT CARDIOVERSION  03/23/2013   DR ROSS   TEE WITHOUT CARDIOVERSION N/A 03/23/2013   Procedure: TRANSESOPHAGEAL ECHOCARDIOGRAM (TEE);  Surgeon: Paula V Ross, MD;  [facility];  Service:  Cardiovascular;  Laterality: N/A;   TEE WITHOUT CARDIOVERSION N/A 03/26/2014   Procedure: TRANSESOPHAGEAL ECHOCARDIOGRAM (TEE);  Surgeon: Peter C Nishan, MD;  [facility];  Service: Cardiovascular;  Laterality: N/A;   TONSILLECTOMY      Family History: Family History  Problem Relation Age of Onset   CAD Father 50   Diabetes Father    Heart attack Father    Transient ischemic attack Father    Vascular Disease Father    Diabetes Mother    Anemia Mother    Gout Mother    Aneurysm Maternal Grandmother    Stroke Maternal Grandfather    Diabetes Paternal Grandmother    Stroke Paternal Grandfather    Diabetes Brother     Social History: Social History   Tobacco Use  Smoking Status Former   Packs/day: 1.00   Years: 7.00   Total pack years: 7.00   Types: Cigarettes   Start date: 05/19/2003   Quit date: 08/06/2010   Years since quitting: 11.6  Smokeless Tobacco Never   Social History   Substance and Sexual Activity  Alcohol Use No   Alcohol/week: 0.0 standard drinks of alcohol   Social History   Substance and Sexual Activity  Drug Use No    Allergies: No Known Allergies  Medications: Current Outpatient Medications  Medication Sig Dispense Refill   albuterol (VENTOLIN HFA) 108 (90 Base) MCG/ACT inhaler Inhale 2 puffs into the lungs every 6 (six) hours as needed for wheezing or shortness of breath. 8 g 0   atorvastatin (LIPITOR) 20 MG tablet Take 20 mg by mouth daily.     azelastine (ASTELIN) 0.1 % nasal spray 1 puff in each nostril Nasally Twice a day     blood glucose meter kit and supplies Dispense based on patient and insurance preference. Use to check blood sugar once a day(FOR ICD-10 E10.9, E11.9). 1 each 0   Blood Glucose Monitoring Suppl (CONTOUR NEXT MONITOR) w/Device KIT as directed per package instructions twice a day     Cholecalciferol (VITAMIN D) 2000 UNITS CAPS Take 4,000 Units by mouth daily.      dofetilide (TIKOSYN) 500 MCG capsule TAKE 1  CAPSULE BY MOUTH TWICE  DAILY. Appointment Required For Further Refills [phone] 180 capsule 0   enalapril (VASOTEC) 10 MG tablet Take 1 tablet (10 mg total) by mouth 2 (two) times daily. 180 tablet 3   esomeprazole (NEXIUM) 40 MG capsule Take 1 capsule (40 mg total) by mouth 2 (two) times daily before a meal. NEEDS OFFICE VISIT 60 capsule 0   fluticasone (FLONASE) 50 MCG/ACT nasal spray Place 1 spray into both nostrils daily as needed for allergies.      Fluticasone-Salmeterol (ADVAIR DISKUS) 250-50 MCG/DOSE AEPB Inhale 1 puff into the lungs every 12 (twelve) hours. 60 each 0   furosemide (LASIX) 40 MG tablet Take 1 tablet (40 mg total) by mouth as needed.     glucose blood (CONTOUR NEXT TEST) test strip   as directed per package instructions In Vitro twice a day     hydrOXYzine (VISTARIL) 25 MG capsule Take 2 capsules (50 mg total) by mouth 3 (three) times daily as needed for anxiety. 30 capsule 0   loratadine (CLARITIN) 10 MG tablet Take 10 mg by mouth daily as needed for allergies.      Magnesium Oxide 400 MG CAPS Take one tablet by mouth twice daily (Patient taking differently: Take one tablet by mouth daily) 60 capsule 9   meloxicam (MOBIC) 15 MG tablet Take 15 mg by mouth as needed.     metFORMIN (GLUCOPHAGE) 1000 MG tablet TAKE 1 TABLET BY MOUTH TWICE  DAILY WITH MEALS 30 tablet 0   metoprolol tartrate (LOPRESSOR) 50 MG tablet Take 50 mg by mouth daily.     nitroGLYCERIN (NITROSTAT) 0.4 MG SL tablet DISSOLVE 1 TABLET UNDER THE TONGUE EVERY 5 MINUTES AS  NEEDED FOR CHEST PAIN. MAX  OF 3 TABLETS IN 15 MINUTES. CALL 911 IF PAIN PERSISTS. 25 tablet 1   oxybutynin (DITROPAN) 5 MG tablet TAKE ONE-HALF TABLET (2.5mg) BY  MOUTH TWO TIMES DAILY AS  NEEDED FOR BLADDER SPASMS 30 tablet 5   potassium chloride SA (KLOR-CON) 20 MEQ tablet TAKE 1 TABLET BY MOUTH  DAILY 90 tablet 3   rivaroxaban (XARELTO) 20 MG TABS tablet TAKE 1 TABLET BY MOUTH ONCE DAILY WITH SUPPER 30 tablet 5   vitamin B-12  (CYANOCOBALAMIN) 500 MCG tablet Take 500 mcg by mouth daily.     No current facility-administered medications for this visit.    Review of Systems: GENERAL: negative for malaise, night sweats HEENT: No changes in hearing or vision, no nose bleeds or other nasal problems. NECK: Negative for lumps, goiter, pain and significant neck swelling RESPIRATORY: Negative for cough, wheezing CARDIOVASCULAR: Negative for chest pain, leg swelling, palpitations, orthopnea GI: SEE HPI MUSCULOSKELETAL: Negative for joint pain or swelling, back pain, and muscle pain. SKIN: Negative for lesions, rash PSYCH: Negative for sleep disturbance, mood disorder and recent psychosocial stressors. HEMATOLOGY Negative for prolonged bleeding, bruising easily, and swollen nodes. ENDOCRINE: Negative for cold or heat intolerance, polyuria, polydipsia and goiter. NEURO: negative for tremor, gait imbalance, syncope and seizures. The remainder of the review of systems is noncontributory.   Physical Exam: BP 110/66 (BP Location: Right Arm, Patient Position: Sitting, Cuff Size: Small)   Pulse 83   Temp (!) 97.5 F (36.4 C) (Temporal)   Ht 6' 1" (1.854 m)   Wt 246 lb 9.6 oz (111.9 kg)   BMI 32.53 kg/m  GENERAL: The patient is AO x3, in no acute distress. HEENT: Head is normocephalic and atraumatic. EOMI are intact. Mouth is well hydrated and without lesions. NECK: Supple. No masses LUNGS: Clear to auscultation. No presence of rhonchi/wheezing/rales. Adequate chest expansion HEART: RRR, normal s1 and s2. ABDOMEN: Soft, nontender, no guarding, no peritoneal signs, and nondistended. BS +. No masses. EXTREMITIES: Without any cyanosis, clubbing, rash, lesions or edema. NEUROLOGIC: AOx3, no focal motor deficit. SKIN: no jaundice, no rashes  Imaging/Labs: as above  I personally reviewed and interpreted the available labs, imaging and endoscopic files.  Impression and Plan: Austin Woods is a 56 y.o. male coming for  follow up of ***   All questions were answered.      Jamya Starry Castaneda, MD Gastroenterology and Hepatology [facility]  

## 2022-03-24 NOTE — Telephone Encounter (Signed)
   Patient Name: Austin Woods  DOB: 1966/03/27 MRN: [id number]  Primary Cardiologist: Thompson Grayer, MD  Clinical pharmacists have reviewed the patient's past medical history, labs, and current medications as part of preoperative protocol coverage. The following recommendations have been made:   Patient with diagnosis of afib on Xarelto for anticoagulation.     Procedure: EGD Date of procedure: TBD   CHA2DS2-VASc Score = 4  This indicates a 4.8% annual risk of stroke. The patient's score is based upon: CHF History: 1 HTN History: 1 Diabetes History: 1 Stroke History: 0 Vascular Disease History: 1 Age Score: 0 Gender Score: 0   CrCl 36m/min using adjusted body weight Platelet count 275K   Per office protocol, patient can hold Xarelto for 2 days prior to procedure as requested.  Please resume Xarelto as soon as possible postprocedure, at the discretion of the surgeon.     I will route this recommendation to the requesting party via Epic fax function and remove from pre-op pool.  Please call with questions.  ELenna Sciara NP 03/24/2022, 10:51 AM

## 2022-04-03 NOTE — Addendum Note (Signed)
Addended by: Cheron Every on: 04/03/2022 10:31 AM   Modules accepted: Orders

## 2022-04-03 NOTE — Telephone Encounter (Signed)
Called pt. Scheduled for 1/11 at 730am. Aware to hold xarelto x 2 days. Instructions sent to [facility]

## 2022-04-03 NOTE — Telephone Encounter (Signed)
The following solutions for the service date entered do not require Pre-Authorization by [employer]. Please note that benefit limits, if applicable, will still be applied. Contact the health plan using the number on the back of the member's ID card if you have any questions regarding coverage or Pre-Authorization requirements.

## 2022-04-09 ENCOUNTER — Other Ambulatory Visit (HOSPITAL_COMMUNITY)
Admission: RE | Admit: 2022-04-09 | Discharge: 2022-04-09 | Disposition: A | Discharge status: Home / Self Care | Payer: BC Managed Care – PPO | Source: Ambulatory Visit | Attending: Gastroenterology | Admitting: Gastroenterology

## 2022-04-09 DIAGNOSIS — K219 Gastro-esophageal reflux disease without esophagitis: Secondary | ICD-10-CM | POA: Insufficient documentation

## 2022-04-09 LAB — BASIC METABOLIC PANEL
Anion gap: 9 (ref 5–15)
BUN: 20 mg/dL (ref 6–20)
CO2: 24 mmol/L (ref 22–32)
Calcium: 9 mg/dL (ref 8.9–10.3)
Chloride: 105 mmol/L (ref 98–111)
Creatinine, Ser: 1.44 mg/dL — ABNORMAL HIGH (ref 0.61–1.24)
GFR, Estimated: 56 mL/min — ABNORMAL LOW (ref >60)
Glucose, Bld: 152 mg/dL — ABNORMAL HIGH (ref 70–99)
Potassium: 4.5 mmol/L (ref 3.5–5.1)
Sodium: 138 mmol/L (ref 135–145)

## 2022-04-16 ENCOUNTER — Encounter (HOSPITAL_COMMUNITY): Payer: Self-pay | Admitting: Gastroenterology

## 2022-04-16 ENCOUNTER — Encounter (HOSPITAL_COMMUNITY)
Admission: RE | Disposition: A | Discharge status: Home / Self Care | Payer: Self-pay | Source: Home / Self Care | Attending: Gastroenterology

## 2022-04-16 ENCOUNTER — Ambulatory Visit (HOSPITAL_COMMUNITY): Payer: BC Managed Care – PPO | Admitting: Certified Registered Nurse Anesthetist

## 2022-04-16 ENCOUNTER — Ambulatory Visit (HOSPITAL_COMMUNITY)
Admission: RE | Admit: 2022-04-16 | Discharge: 2022-04-16 | Disposition: A | Discharge status: Home / Self Care | Payer: BC Managed Care – PPO | Attending: Gastroenterology | Admitting: Gastroenterology

## 2022-04-16 ENCOUNTER — Other Ambulatory Visit: Payer: Self-pay

## 2022-04-16 DIAGNOSIS — I509 Heart failure, unspecified: Secondary | ICD-10-CM | POA: Insufficient documentation

## 2022-04-16 DIAGNOSIS — I110 Hypertensive heart disease with heart failure: Secondary | ICD-10-CM | POA: Diagnosis not present

## 2022-04-16 DIAGNOSIS — Z7984 Long term (current) use of oral hypoglycemic drugs: Secondary | ICD-10-CM | POA: Diagnosis not present

## 2022-04-16 DIAGNOSIS — J45909 Unspecified asthma, uncomplicated: Secondary | ICD-10-CM | POA: Diagnosis not present

## 2022-04-16 DIAGNOSIS — Z9889 Other specified postprocedural states: Secondary | ICD-10-CM | POA: Insufficient documentation

## 2022-04-16 DIAGNOSIS — K3189 Other diseases of stomach and duodenum: Secondary | ICD-10-CM

## 2022-04-16 DIAGNOSIS — I2510 Atherosclerotic heart disease of native coronary artery without angina pectoris: Secondary | ICD-10-CM | POA: Insufficient documentation

## 2022-04-16 DIAGNOSIS — I4891 Unspecified atrial fibrillation: Secondary | ICD-10-CM | POA: Diagnosis not present

## 2022-04-16 DIAGNOSIS — Z87891 Personal history of nicotine dependence: Secondary | ICD-10-CM | POA: Insufficient documentation

## 2022-04-16 DIAGNOSIS — Z79899 Other long term (current) drug therapy: Secondary | ICD-10-CM | POA: Diagnosis not present

## 2022-04-16 DIAGNOSIS — K449 Diaphragmatic hernia without obstruction or gangrene: Secondary | ICD-10-CM | POA: Insufficient documentation

## 2022-04-16 DIAGNOSIS — G4733 Obstructive sleep apnea (adult) (pediatric): Secondary | ICD-10-CM | POA: Diagnosis not present

## 2022-04-16 DIAGNOSIS — C7A8 Other malignant neuroendocrine tumors: Secondary | ICD-10-CM | POA: Insufficient documentation

## 2022-04-16 DIAGNOSIS — K219 Gastro-esophageal reflux disease without esophagitis: Secondary | ICD-10-CM | POA: Insufficient documentation

## 2022-04-16 DIAGNOSIS — C7A1 Malignant poorly differentiated neuroendocrine tumors: Secondary | ICD-10-CM | POA: Diagnosis not present

## 2022-04-16 DIAGNOSIS — E119 Type 2 diabetes mellitus without complications: Secondary | ICD-10-CM | POA: Insufficient documentation

## 2022-04-16 HISTORY — PX: ESOPHAGOGASTRODUODENOSCOPY (EGD) WITH PROPOFOL: SHX5813

## 2022-04-16 HISTORY — PX: BIOPSY: SHX5522

## 2022-04-16 LAB — GLUCOSE, CAPILLARY: Glucose-Capillary: 144 mg/dL — ABNORMAL HIGH (ref 70–99)

## 2022-04-16 SURGERY — ESOPHAGOGASTRODUODENOSCOPY (EGD) WITH PROPOFOL
Anesthesia: General

## 2022-04-16 MED ORDER — STERILE WATER FOR IRRIGATION IR SOLN
Status: DC | PRN
Start: 2022-04-16 — End: 2022-04-16
  Administered 2022-04-16: 120 mL

## 2022-04-16 MED ORDER — LACTATED RINGERS IV SOLN: INTRAVENOUS | Status: DC | PRN

## 2022-04-16 MED ORDER — LIDOCAINE HCL (PF) 2 % IJ SOLN
INTRAMUSCULAR | Status: AC
  Filled 2022-04-16: qty 5

## 2022-04-16 MED ORDER — VONOPRAZAN FUMARATE 20 MG PO TABS: 1.0000 | ORAL_TABLET | Freq: Every day | ORAL | 0 refills | Status: DC

## 2022-04-16 MED ORDER — LIDOCAINE HCL (CARDIAC) PF 100 MG/5ML IV SOSY
PREFILLED_SYRINGE | INTRAVENOUS | Status: DC | PRN
  Administered 2022-04-16: 60 mg via INTRAVENOUS

## 2022-04-16 MED ORDER — PROPOFOL 10 MG/ML IV BOLUS
INTRAVENOUS | Status: AC
  Filled 2022-04-16: qty 40

## 2022-04-16 MED ORDER — PROPOFOL 10 MG/ML IV BOLUS
INTRAVENOUS | Status: DC | PRN
Start: 2022-04-16 — End: 2022-04-16
  Administered 2022-04-16: 30 mg via INTRAVENOUS
  Administered 2022-04-16: 40 mg via INTRAVENOUS
  Administered 2022-04-16: 100 mg via INTRAVENOUS
  Administered 2022-04-16: 50 mg via INTRAVENOUS

## 2022-04-16 NOTE — Transfer of Care (Signed)
Immediate Anesthesia Transfer of Care Note  Patient: Austin Woods  Procedure(s) Performed: ESOPHAGOGASTRODUODENOSCOPY (EGD) WITH PROPOFOL BIOPSY  Patient Location: Endoscopy Unit  Anesthesia Type:General  Level of Consciousness: awake  Airway & Oxygen Therapy: Patient Spontanous Breathing  Post-op Assessment: Report given to RN and Post -op Vital signs reviewed and stable  Post vital signs: Reviewed and stable  Last Vitals:  Vitals Value Taken Time  BP 102/50 04/16/22 0743  Temp 36.4 C 04/16/22 0743  Pulse 75 04/16/22 0743  Resp 19 04/16/22 0743  SpO2 96 % 04/16/22 0743    Last Pain:  Vitals:   04/16/22 0743  TempSrc: Oral  PainSc: 0-No pain      Patients Stated Pain Goal: 4 (83/07/35 4301)  Complications: No notable events documented.

## 2022-04-16 NOTE — Anesthesia Postprocedure Evaluation (Signed)
Anesthesia Post Note  Patient: Austin Woods  Procedure(s) Performed: ESOPHAGOGASTRODUODENOSCOPY (EGD) WITH PROPOFOL BIOPSY  Patient location during evaluation: Endoscopy Anesthesia Type: General Level of consciousness: awake and alert Pain management: pain level controlled Vital Signs Assessment: post-procedure vital signs reviewed and stable Respiratory status: spontaneous breathing, nonlabored ventilation, respiratory function stable and patient connected to nasal cannula oxygen Cardiovascular status: blood pressure returned to baseline and stable Postop Assessment: no apparent nausea or vomiting Anesthetic complications: no   There were no known notable events for this encounter.   Last Vitals:  Vitals:   04/16/22 0649 04/16/22 0743  BP: (!) 144/67 (!) 102/56  Pulse: 73 75  Resp: 14 19  Temp: 36.5 C 36.4 C  SpO2: 98% 96%    Last Pain:  Vitals:   04/16/22 0743  TempSrc: Oral  PainSc: 0-No pain                 [name]

## 2022-04-16 NOTE — Discharge Instructions (Signed)
You are being discharged to home.  Resume your previous diet.  We are waiting for your pathology results.  - Start Voquezna 20 mg qday for 12 weeks, then decrease to 10 mg daily. - Stop Nexium once you start Voquezna. - Restart Xarelto tomorrow

## 2022-04-16 NOTE — Op Note (Signed)
[facility] Patient Name: Austin Woods Procedure Date: 04/16/2022 7:16 AM MRN: [id number] Date of Birth: [birth year] Attending MD: Maylon Peppers , , [phone] CSN: [id number] Age: 57 Admit Type: Outpatient Procedure:                Upper GI endoscopy Indications:              Follow-up of gastro-esophageal reflux disease Providers:                Maylon Peppers, Lambert Mody, Kristine L.                            Risa Grill, Technician Referring MD:              Medicines:                Monitored Anesthesia Care Complications:            No immediate complications. Estimated Blood Loss:     Estimated blood loss: none. Estimated blood loss:                            none. Procedure:                Pre-Anesthesia Assessment:                           - Prior to the procedure, a History and Physical                            was performed, and patient medications, allergies                            and sensitivities were reviewed. The patient's                            tolerance of previous anesthesia was reviewed.                           - The risks and benefits of the procedure and the                            sedation options and risks were discussed with the                            patient. All questions were answered and informed                            consent was obtained.                           - ASA Grade Assessment: II - A patient with mild                            systemic disease.                           After obtaining informed consent, the endoscope was  passed under direct vision. Throughout the                            procedure, the patient's blood pressure, pulse, and                            oxygen saturations were monitored continuously. The                            [id number]-[id number] ([id number]) scope was introduced through the                            mouth, and advanced to the second part of duodenum.                             The upper GI endoscopy was accomplished without                            difficulty. The patient tolerated the procedure                            well. Scope In: 7:30:04 AM Scope Out: 7:40:15 AM Total Procedure Duration: 0 hours 10 minutes 11 seconds  Findings:      A 1 cm hiatal hernia was present.      Prior Nissen fundoplication was found at the gastroesophageal junction.       This was characterized by loose wrap. There was extra mucosal tissue at       the 6-7 position in the retroflexion.      The gastroesophageal flap valve was visualized endoscopically and       classified as Hill Grade II (fold present, opens with respiration). This       was borderline      A single 5 mm mucosal papule (nodule) with no bleeding and stigmata of       recent bleeding was found on the anterior wall of the stomach. This       easily bled with gentle touch. Query possible GIST? Biopsies were taken       with a cold forceps for histology.      The examined duodenum was normal. Impression:               - 1 cm hiatal hernia.                           - Nissen fundoplication was found, characterized by                            loose wrap.                           - Gastroesophageal flap valve classified as Hill                            Grade II (fold present, opens with respiration).                           -  A single mucosal papule (nodule) found in the                            stomach. Query GIST? Biopsied.                           - Normal examined duodenum. Moderate Sedation:      Per Anesthesia Care Recommendation:           - Discharge patient to home (ambulatory).                           - Resume previous diet.                           - Await pathology results.                           - Start Voquezna 20 mg qday for 12 weeks, then                            decrease to 10 mg daily.                           - Stop Nexium once you start  Voquezna.                           - Restart Xarelto tomorrow Procedure Code(s):        --- Professional ---                           [phone], Esophagogastroduodenoscopy, flexible,                            transoral; with biopsy, single or multiple Diagnosis Code(s):        --- Professional ---                           K44.9, Diaphragmatic hernia without obstruction or                            gangrene                           K31.89, Other diseases of stomach and duodenum                           K21.9, Gastro-esophageal reflux disease without                            esophagitis CPT copyright 2022 American Medical Association. All rights reserved. The codes documented in this report are preliminary and upon coder review may  be revised to meet current compliance requirements. Maylon Peppers, MD Maylon Peppers,  04/16/2022 7:58:27 AM This report has been signed electronically. Number of Addenda: 0

## 2022-04-16 NOTE — Interval H&P Note (Signed)
History and Physical Interval Note:  04/16/2022 7:22 AM  Austin Woods  has presented today for surgery, with the diagnosis of GERD.  The various methods of treatment have been discussed with the patient and family. After consideration of risks, benefits and other options for treatment, the patient has consented to  Procedure(s) with comments: ESOPHAGOGASTRODUODENOSCOPY (EGD) WITH PROPOFOL (N/A) - 730am, asa 1-2 as a surgical intervention.  The patient's history has been reviewed, patient examined, no change in status, stable for surgery.  I have reviewed the patient's chart and labs.  Questions were answered to the patient's satisfaction.     Maylon Peppers Mayorga

## 2022-04-16 NOTE — Anesthesia Preprocedure Evaluation (Signed)
Anesthesia Evaluation  Patient identified by MRN, date of birth, ID band Patient awake    Reviewed: Allergy & Precautions, NPO status , Patient's Chart, lab work & pertinent test results  History of Anesthesia Complications Negative for: history of anesthetic complications  Airway Mallampati: II  TM Distance: >3 FB Neck ROM: Full    Dental  (+) Dental Advisory Given, Teeth Intact   Pulmonary asthma , sleep apnea , neg COPD,  COPD inhaler, former smoker   Pulmonary exam normal breath sounds clear to auscultation       Cardiovascular Exercise Tolerance: Good hypertension, Pt. on medications and Pt. on home beta blockers + CAD and +CHF  + dysrhythmias (hx) Atrial Fibrillation  Rhythm:Regular Rate:Bradycardia - Systolic murmurs, - Diastolic murmurs, - Friction Rub, - Carotid Bruit, - Peripheral Edema and - Systolic Click Please let the patient know his echocardiogram showed normal pumping function of the heart with a preserved EF of 60-65%. No significant wall motion abnormalities or valve abnormalities. Given elevated creatinine earlier and reassuring echo results, would reduce Lasix to '20mg'$  daily.   Hgb A1c was checked as well as the patient requested this due to missing prior labs. Was elevated to 7.1 having been at 5.9 five months prior   Neuro/Psych  Headaches  negative psych ROS   GI/Hepatic Neg liver ROS,GERD  Medicated,,  Endo/Other  negative endocrine ROSdiabetes, Well Controlled, Type 2, Oral Hypoglycemic Agents    Renal/GU negative Renal ROS     Musculoskeletal  (+) Arthritis ,    Abdominal  (+) + obese  Peds  Hematology  (+) Blood dyscrasia, anemia   Anesthesia Other Findings   Reproductive/Obstetrics                             Anesthesia Physical Anesthesia Plan  ASA: 3  Anesthesia Plan: General   Post-op Pain Management:    Induction: Intravenous  PONV Risk Score and  Plan: TIVA and Propofol infusion  Airway Management Planned: Nasal Cannula, Natural Airway and Simple Face Mask  Additional Equipment:   Intra-op Plan:   Post-operative Plan: Extubation in OR  Informed Consent: I have reviewed the patients History and Physical, chart, labs and discussed the procedure including the risks, benefits and alternatives for the proposed anesthesia with the patient or authorized representative who has indicated his/her understanding and acceptance.     Dental advisory given  Plan Discussed with: CRNA  Anesthesia Plan Comments:         Anesthesia Quick Evaluation

## 2022-04-20 LAB — SURGICAL PATHOLOGY

## 2022-04-21 NOTE — Progress Notes (Signed)
other specified primary malignant

## 2022-04-22 ENCOUNTER — Other Ambulatory Visit: Payer: Self-pay | Admitting: Nurse Practitioner

## 2022-04-23 ENCOUNTER — Encounter (HOSPITAL_COMMUNITY): Payer: Self-pay | Admitting: Gastroenterology

## 2022-04-24 ENCOUNTER — Encounter: Payer: Self-pay | Admitting: Physician Assistant

## 2022-04-24 ENCOUNTER — Ambulatory Visit: Payer: BC Managed Care – PPO | Attending: Internal Medicine | Admitting: Internal Medicine

## 2022-04-24 VITALS — BP 142/82 | HR 63 | Ht 73.0 in | Wt 250.6 lb

## 2022-04-24 DIAGNOSIS — I4819 Other persistent atrial fibrillation: Secondary | ICD-10-CM

## 2022-04-24 DIAGNOSIS — I2510 Atherosclerotic heart disease of native coronary artery without angina pectoris: Secondary | ICD-10-CM | POA: Diagnosis not present

## 2022-04-24 DIAGNOSIS — E785 Hyperlipidemia, unspecified: Secondary | ICD-10-CM | POA: Insufficient documentation

## 2022-04-24 DIAGNOSIS — E782 Mixed hyperlipidemia: Secondary | ICD-10-CM

## 2022-04-24 MED ORDER — ATORVASTATIN CALCIUM 40 MG PO TABS: 40.0000 mg | ORAL_TABLET | Freq: Every day | ORAL | 3 refills | Status: AC

## 2022-04-24 NOTE — Patient Instructions (Signed)
Medication Instructions:   Increase Lipitor to 40 mg daily at night   *If you need a refill on your cardiac medications before your next appointment, please call your pharmacy*   Lab Work: Your physician recommends that you return for lab work in: Fasting (Lipids)   If you have labs (blood work) drawn today and your tests are completely normal, you will receive your results only by: [place] (if you have MyChart) OR A paper copy in the mail If you have any lab test that is abnormal or we need to change your treatment, we will call you to review the results.   Testing/Procedures: NONE     Follow-Up: At [facility], you and your health needs are our priority.  As part of our continuing mission to provide you with exceptional heart care, we have created designated Provider Care Teams.  These Care Teams include your primary Cardiologist (physician) and Advanced Practice Providers (APPs -  Physician Assistants and Nurse Practitioners) who all work together to provide you with the care you need, when you need it.  We recommend signing up for the patient portal called "MyChart".  Sign up information is provided on this After Visit Summary.  MyChart is used to connect with patients for Virtual Visits (Telemedicine).  Patients are able to view lab/test results, encounter notes, upcoming appointments, etc.  Non-urgent messages can be sent to your provider as well.   To learn more about what you can do with MyChart, go to [email or web address].    Your next appointment:   6 month(s)  Provider:   Claudina Lick, MD    Other Instructions Thank you for choosing [place]!

## 2022-04-24 NOTE — Progress Notes (Signed)
Cardiology Office Note  Date: 04/24/2022   ID: Austin Woods, DOB 1965/05/01, MRN [id number]  PCP:  Wanita Chamberlain, PA-C  Cardiologist:  Thompson Grayer, MD Electrophysiologist:  Thompson Grayer, MD   Reason for Office Visit: Follow-up of paroxysmal A-fib on Tikosyn and [facility]   History of Present Illness: Austin Woods is a 57 y.o. male known to have CAD s/p LAD PCI in 01/2016, paroxysmal atrial fibrillation s/p multiple ablations currently on Tikosyn and AC, HTN, HLD, OSA, COPD presented to cardiology clinic for follow-up visit.  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  But otherwise, overall doing great, denies any symptoms of chest pain, DOE, dizziness, lightness, syncope, palpitations and leg swelling. He is compliant with his medications and has no side effects.  Past Medical History:  Diagnosis Date   A-fib ([place])    Anemia    iron def anemia   Arthritis    Asthma    Coronary artery disease    Dysrhythmia    in and out Afib   GERD (gastroesophageal reflux disease)    Headache    High cholesterol    History of DVT (deep vein thrombosis)    a. left leg following ablation for SVT   Hypertension    Persistent atrial fibrillation ([place])    a. Dx 08/2012, xarelto initiated; b. 09/2012 Tikosyn initiated -> DCCV -> Sinus c. PVI 03/2013 d. PVI 03/2014   Sleep apnea    USES  CPAP    Type 2 diabetes mellitus without complication, without long-term current use of insulin ([place]) 02/16/2018   Wolff-Parkinson-White (WPW) syndrome    resolved s/p RFCA 2001 by Dr Caryl Comes    Past Surgical History:  Procedure Laterality Date   ATRIAL FIBRILLATION ABLATION  03/23/2013   PVI by DR Rayann Heman for afib   ATRIAL FIBRILLATION ABLATION N/A 03/23/2013   Procedure: ATRIAL FIBRILLATION ABLATION;  Surgeon: Coralyn Mark, MD;  Location: [place] CATH LAB;  Service: Cardiovascular;   Laterality: N/A;   ATRIAL FIBRILLATION ABLATION N/A 03/27/2014   PVI Dr Norman Herrlich SURGERY     BIOPSY  02/28/2020   Procedure: BIOPSY;  Surgeon: Rogene Houston, MD;  [facility];  Service: Endoscopy;;  antral(pre-pyloric)    BIOPSY  04/16/2022   Procedure: BIOPSY;  Surgeon: Harvel Quale, MD;  [facility];  Service: Gastroenterology;;   CARDIAC CATHETERIZATION N/A 02/03/2016   Procedure: Left Heart Cath and Coronary Angiography;  Surgeon: Nelva Bush, MD;  Location: [place] CV LAB;  Service: Cardiovascular;  Laterality: N/A;   CARDIAC CATHETERIZATION N/A 02/03/2016   Procedure: Intravascular Pressure Wire/FFR Study;  Surgeon: Nelva Bush, MD;  Location: [place] CV LAB;  Service: Cardiovascular;  Laterality: N/A;   CARDIAC CATHETERIZATION N/A 02/03/2016   Procedure: Coronary Stent Intervention;  Surgeon: Nelva Bush, MD;  Location: [place] CV LAB;  Service: Cardiovascular;  Laterality: N/A;   CARDIAC SURGERY     CARDIOVERSION N/A 08/25/2012   Procedure: CARDIOVERSION;  Surgeon: Thayer Headings, MD;  Location: [facility] ENDOSCOPY;  Service: Cardiovascular;  Laterality: N/A;   CARDIOVERSION N/A 09/06/2012   Procedure: CARDIOVERSION/Bedside;  Surgeon: Carlena Bjornstad, MD;  Location: [place];  Service: Cardiovascular;  Laterality: N/A;   CARDIOVERSION N/A 01/12/2014   Procedure: CARDIOVERSION;  Surgeon: Pixie Casino, MD;  Location: [facility] ENDOSCOPY;  Service: Cardiovascular;  Laterality: N/A;   CARDIOVERSION N/A 01/30/2016   Procedure: CARDIOVERSION;  Surgeon: Sueanne Margarita, MD;  Location: [place] ENDOSCOPY;  Service: Cardiovascular;  Laterality: N/A;   CARDIOVERSION N/A 04/14/2016   Procedure: CARDIOVERSION;  Surgeon: Pixie Casino, MD;  Location: [facility] ENDOSCOPY;  Service: Cardiovascular;  Laterality: N/A;   COLONOSCOPY N/A 12/04/2015   Procedure: COLONOSCOPY;  Surgeon: Rogene Houston, MD;  [facility];  Service: Endoscopy;  Laterality:  N/A;  730   COLONOSCOPY WITH PROPOFOL N/A 04/28/2019   Procedure: COLONOSCOPY WITH PROPOFOL;  Surgeon: Rogene Houston, MD;  [facility];  Service: Endoscopy;  Laterality: N/A;   CORONARY ANGIOPLASTY     CORONARY ANGIOPLASTY WITH STENT PLACEMENT     ELECTROPHYSIOLOGIC STUDY N/A 02/18/2016   Procedure: Atrial Fibrillation Ablation;  Surgeon: Thompson Grayer, MD;  Location: [place] CV LAB;  Service: Cardiovascular;  Laterality: N/A;   ESOPHAGOGASTRODUODENOSCOPY (EGD) WITH PROPOFOL N/A 04/28/2019   Procedure: ESOPHAGOGASTRODUODENOSCOPY (EGD) WITH PROPOFOL;  Surgeon: Rogene Houston, MD;  [facility];  Service: Endoscopy;  Laterality: N/A;  7:30   ESOPHAGOGASTRODUODENOSCOPY (EGD) WITH PROPOFOL N/A 02/28/2020   Procedure: ESOPHAGOGASTRODUODENOSCOPY (EGD) WITH PROPOFOL;  Surgeon: Rogene Houston, MD;  [facility];  Service: Endoscopy;  Laterality: N/A;  1245   ESOPHAGOGASTRODUODENOSCOPY (EGD) WITH PROPOFOL N/A 04/16/2022   Procedure: ESOPHAGOGASTRODUODENOSCOPY (EGD) WITH PROPOFOL;  Surgeon: Harvel Quale, MD;  [facility];  Service: Gastroenterology;  Laterality: N/A;  730am, asa 1-2   implantable loop recorder removal  03/01/2019   MDT Reveal LINQ removed in office   INGUINAL HERNIA REPAIR Bilateral 06/04/2017   Procedure: OPEN BILATERAL INGUINAL HERNIA REPAIR;  Surgeon: Judeth Horn, MD;  Location: [place];  Service: General;  Laterality: Bilateral;   LOOP RECORDER IMPLANT N/A 06/29/2014   Procedure: LOOP RECORDER IMPLANT;  Surgeon: Thompson Grayer, MD;  Location: [facility] CATH LAB;  Service: Cardiovascular;  Laterality: N/A;   NOSE SURGERY     sleep apnea surgery   PERIPHERAL VASCULAR CATHETERIZATION N/A 03/13/2016   Procedure: Thrombin Injection;  Surgeon: Serafina Mitchell, MD;  Location: [place] CV LAB;  Service: Cardiovascular;  Laterality: N/A;   POLYPECTOMY  04/28/2019   Procedure: POLYPECTOMY;  Surgeon: Rogene Houston, MD;  [facility];  Service: Endoscopy;;  duodenum;splenic flexure;   STOMACH SURGERY     reflux, fundoplication   TEE WITHOUT CARDIOVERSION N/A 08/25/2012   Procedure: TRANSESOPHAGEAL ECHOCARDIOGRAM (TEE);  Surgeon: Thayer Headings, MD;  Location: [place];  Service: Cardiovascular;  Laterality: N/A;   TEE WITHOUT CARDIOVERSION  03/23/2013   DR ROSS   TEE WITHOUT CARDIOVERSION N/A 03/23/2013   Procedure: TRANSESOPHAGEAL ECHOCARDIOGRAM (TEE);  Surgeon: Fay Records, MD;  Location: [facility] ENDOSCOPY;  Service: Cardiovascular;  Laterality: N/A;   TEE WITHOUT CARDIOVERSION N/A 03/26/2014   Procedure: TRANSESOPHAGEAL ECHOCARDIOGRAM (TEE);  Surgeon: Josue Hector, MD;  Location: [facility] ENDOSCOPY;  Service: Cardiovascular;  Laterality: N/A;   TONSILLECTOMY      Current Outpatient Medications  Medication Sig Dispense Refill   albuterol (VENTOLIN HFA) 108 (90 Base) MCG/ACT inhaler Inhale 2 puffs into the lungs every 6 (six) hours as needed for wheezing or shortness of breath. 8 g 0   atorvastatin (LIPITOR) 40 MG tablet Take 1 tablet (40 mg total) by mouth daily. 90 tablet 3   azelastine (ASTELIN) 0.1 % nasal spray 1 puff in each nostril Nasally Twice a day  blood glucose meter kit and supplies Dispense based on patient and insurance preference. Use to check blood sugar once a day(FOR ICD-10 E10.9, E11.9). 1 each 0   Blood Glucose Monitoring Suppl (CONTOUR NEXT MONITOR) w/Device KIT as directed per package instructions twice a day     Cholecalciferol (VITAMIN D) 2000 UNITS CAPS Take 4,000 Units by mouth daily.      dofetilide (TIKOSYN) 500 MCG capsule TAKE 1 CAPSULE BY MOUTH TWICE  DAILY 180 capsule 1   enalapril (VASOTEC) 10 MG tablet Take 1 tablet (10 mg total) by mouth 2 (two) times daily. 180 tablet 3   fluticasone (FLONASE) 50 MCG/ACT nasal spray Place 1 spray into both nostrils daily as needed for allergies.      Fluticasone-Salmeterol (ADVAIR DISKUS) 250-50 MCG/DOSE AEPB Inhale 1 puff into the lungs  every 12 (twelve) hours. 60 each 0   furosemide (LASIX) 40 MG tablet Take 1 tablet (40 mg total) by mouth as needed.     glucose blood (CONTOUR NEXT TEST) test strip as directed per package instructions In Vitro twice a day     hydrOXYzine (VISTARIL) 25 MG capsule Take 2 capsules (50 mg total) by mouth 3 (three) times daily as needed for anxiety. 30 capsule 0   loratadine (CLARITIN) 10 MG tablet Take 10 mg by mouth daily as needed for allergies.      Magnesium Oxide 400 MG CAPS Take one tablet by mouth twice daily (Patient taking differently: Take one tablet by mouth daily) 60 capsule 9   meloxicam (MOBIC) 15 MG tablet Take 15 mg by mouth as needed.     metFORMIN (GLUCOPHAGE) 1000 MG tablet TAKE 1 TABLET BY MOUTH TWICE  DAILY WITH MEALS 30 tablet 0   metoprolol tartrate (LOPRESSOR) 50 MG tablet Take 50 mg by mouth daily.     nitroGLYCERIN (NITROSTAT) 0.4 MG SL tablet DISSOLVE 1 TABLET UNDER THE TONGUE EVERY 5 MINUTES AS  NEEDED FOR CHEST PAIN. MAX  OF 3 TABLETS IN 15 MINUTES. CALL 911 IF PAIN PERSISTS. 25 tablet 1   oxybutynin (DITROPAN) 5 MG tablet TAKE ONE-HALF TABLET (2.'5mg'$ ) BY  MOUTH TWO TIMES DAILY AS  NEEDED FOR BLADDER SPASMS 30 tablet 5   pantoprazole (PROTONIX) 40 MG tablet Take 40 mg by mouth 2 (two) times daily.     potassium chloride SA (KLOR-CON) 20 MEQ tablet TAKE 1 TABLET BY MOUTH  DAILY 90 tablet 3   rivaroxaban (XARELTO) 20 MG TABS tablet TAKE 1 TABLET BY MOUTH ONCE DAILY WITH SUPPER 30 tablet 5   vitamin B-12 (CYANOCOBALAMIN) 500 MCG tablet Take 500 mcg by mouth daily.     No current facility-administered medications for this visit.   Allergies:  Patient has no known allergies.   Social History: The patient  reports that he quit smoking about 11 years ago. His smoking use included cigarettes. He started smoking about 18 years ago. He has a 7.00 pack-year smoking history. He has never used smokeless tobacco. He reports that he does not drink alcohol and does not use drugs.    Family History: The patient's family history includes Anemia in his mother; Aneurysm in his maternal grandmother; CAD (age of onset: 27) in his father; Diabetes in his brother, father, mother, and paternal grandmother; Gout in his mother; Heart attack in his father; Stroke in his maternal grandfather and paternal grandfather; Transient ischemic attack in his father; Vascular Disease in his father.   ROS:  Please see the history of present illness. Otherwise, complete  review of systems is positive for none.  All other systems are reviewed and negative.   Physical Exam: VS:  BP (!) 142/82 (BP Location: Left Arm, Patient Position: Sitting, Cuff Size: Normal)   Pulse 63   Ht '6\' 1"'$  (1.854 m)   Wt 250 lb 9.6 oz (113.7 kg)   SpO2 96%   BMI 33.06 kg/m , BMI Body mass index is 33.06 kg/m.  Wt Readings from Last 3 Encounters:  04/24/22 250 lb 9.6 oz (113.7 kg)  04/16/22 245 lb (111.1 kg)  03/23/22 246 lb 9.6 oz (111.9 kg)    General: Patient appears comfortable at rest. HEENT: Conjunctiva and lids normal, oropharynx clear with moist mucosa. Neck: Supple, no elevated JVP or carotid bruits, no thyromegaly. Lungs: Clear to auscultation, nonlabored breathing at rest. Cardiac: Regular rate and rhythm, no S3 or significant systolic murmur, no pericardial rub. Abdomen: Soft, nontender, no hepatomegaly, bowel sounds present, no guarding or rebound. Extremities: No pitting edema, distal pulses 2+. Skin: Warm and dry. Musculoskeletal: No kyphosis. Neuropsychiatric: Alert and oriented x3, affect grossly appropriate.  ECG:  An ECG dated 04/24/2022 was personally reviewed today and demonstrated:  Normal sinus rhythm, normal QTc interval  Recent Labwork: 06/11/2021: Magnesium 1.7 04/09/2022: BUN 20; Creatinine, Ser 1.44; Potassium 4.5; Sodium 138     Component Value Date/Time   CHOL 161 11/25/2018 1443   TRIG 219 (H) 11/25/2018 1443   HDL 35 (L) 11/25/2018 1443   CHOLHDL 4.6 11/25/2018 1443   CHOLHDL  4.7 01/31/2016 0308   VLDL 56 (H) 01/31/2016 0308   LDLCALC 82 11/25/2018 1443    Other Studies Reviewed Today:   Assessment and Plan: Patient is a 57 year old M known to have CAD s/p LAD PCI in 01/2016, paroxysmal atrial fibrillation s/p multiple ablations currently on Tikosyn and AC, HTN, HLD, OSA, COPD presented to cardiology clinic for follow-up visit.  # CAD s/p LAD PCI in 01/2016 with normal LVEF, currently angina free -Not on baby aspirin due to Xarelto use -Increase atorvastatin from 20 mg to 40 mg nightly -Continue metoprolol tartrate 50 mg once daily -Continue enalapril 10 mg twice daily -ER precautions for chest pain  # HLD, currently not at goal -Increase atorvastatin from 20 mg to 40 mg nightly. Goal LDL less than 70. Obtain lipid panel.  # Paroxysmal A-fib s/p multiple ablations -Continue Tikosyn 500 mcg twice daily (EKG today showed normal sinus rhythm and normal QTc interval) -Continue Xarelto 20 mg once daily with meals  I have spent a total of 33 minutes with patient reviewing chart, EKGs, labs and examining patient as well as establishing an assessment and plan that was discussed with the patient.  > 50% of time was spent in direct patient care.     Medication Adjustments/Labs and Tests Ordered: Current medicines are reviewed at length with the patient today.  Concerns regarding medicines are outlined above.   Tests Ordered: Orders Placed This Encounter  Procedures   Lipid panel   EKG 12-Lead    Medication Changes: Meds ordered this encounter  Medications   atorvastatin (LIPITOR) 40 MG tablet    Sig: Take 1 tablet (40 mg total) by mouth daily.    Dispense:  90 tablet    Refill:  3    Disposition:  Follow up  6 months  Signed, Seymour Pavlak Fidel Levy, MD, 04/24/2022 3:25 PM    [facility] at [facility] [address], [place], [place] [address]

## 2022-05-04 ENCOUNTER — Other Ambulatory Visit (INDEPENDENT_AMBULATORY_CARE_PROVIDER_SITE_OTHER): Payer: Self-pay | Admitting: *Deleted

## 2022-05-04 ENCOUNTER — Encounter (INDEPENDENT_AMBULATORY_CARE_PROVIDER_SITE_OTHER): Payer: Self-pay | Admitting: *Deleted

## 2022-05-04 DIAGNOSIS — D490 Neoplasm of unspecified behavior of digestive system: Secondary | ICD-10-CM

## 2022-05-06 ENCOUNTER — Telehealth: Payer: Self-pay | Admitting: Internal Medicine

## 2022-05-06 NOTE — Telephone Encounter (Signed)
Patient states he has a single extraction scheduled at his dentist office on Friday, 2/02. He states his dentist already advised him to hold the medication for 2 days prior (starting today), but he wanted to check with Dr. Dellia Cloud first to make sure this is alright.

## 2022-05-07 NOTE — Telephone Encounter (Signed)
Left a message for patient to call office back regarding Xarelto.

## 2022-05-07 NOTE — Telephone Encounter (Signed)
Patient notified via [place] and verbalized understanding.

## 2022-05-14 ENCOUNTER — Encounter (HOSPITAL_COMMUNITY): Payer: Self-pay | Admitting: *Deleted

## 2022-05-15 ENCOUNTER — Other Ambulatory Visit (HOSPITAL_COMMUNITY)
Admission: RE | Admit: 2022-05-15 | Discharge: 2022-05-15 | Disposition: A | Discharge status: Home / Self Care | Payer: BC Managed Care – PPO | Source: Ambulatory Visit | Attending: Internal Medicine | Admitting: Internal Medicine

## 2022-05-15 DIAGNOSIS — E782 Mixed hyperlipidemia: Secondary | ICD-10-CM | POA: Diagnosis not present

## 2022-05-15 LAB — LIPID PANEL
Cholesterol: 125 mg/dL (ref 0–200)
HDL: 33 mg/dL — ABNORMAL LOW (ref >40)
LDL Cholesterol: 64 mg/dL (ref 0–99)
Total CHOL/HDL Ratio: 3.8 RATIO
Triglycerides: 140 mg/dL (ref <150)
VLDL: 28 mg/dL (ref 0–40)

## 2022-05-18 ENCOUNTER — Telehealth: Payer: Self-pay

## 2022-05-18 NOTE — Telephone Encounter (Signed)
Patient notified and verbalized understanding. Patient had no questions or concerns at this time. PCP copied 

## 2022-05-18 NOTE — Telephone Encounter (Signed)
-----   Message from Chalmers Guest, MD sent at 05/18/2022 10:14 AM EST ----- LDL level is 64.  At goal.  Continue current statin therapy.

## 2022-05-20 ENCOUNTER — Ambulatory Visit (HOSPITAL_COMMUNITY)
Admission: RE | Admit: 2022-05-20 | Discharge: 2022-05-20 | Disposition: A | Discharge status: Home / Self Care | Payer: BC Managed Care – PPO | Source: Ambulatory Visit | Attending: Gastroenterology | Admitting: Gastroenterology

## 2022-05-20 DIAGNOSIS — C7A8 Other malignant neuroendocrine tumors: Secondary | ICD-10-CM | POA: Diagnosis not present

## 2022-05-20 DIAGNOSIS — D490 Neoplasm of unspecified behavior of digestive system: Secondary | ICD-10-CM | POA: Insufficient documentation

## 2022-05-20 MED ORDER — IOHEXOL 300 MG/ML  SOLN
100.0000 mL | Freq: Once | INTRAMUSCULAR | Status: AC | PRN
  Administered 2022-05-20: 100 mL via INTRAVENOUS

## 2022-05-21 ENCOUNTER — Other Ambulatory Visit (INDEPENDENT_AMBULATORY_CARE_PROVIDER_SITE_OTHER): Payer: Self-pay

## 2022-05-21 DIAGNOSIS — K219 Gastro-esophageal reflux disease without esophagitis: Secondary | ICD-10-CM

## 2022-05-21 MED ORDER — VOQUEZNA 20 MG PO TABS: 1.0000 | ORAL_TABLET | Freq: Every day | ORAL | 1 refills | Status: DC

## 2022-05-21 NOTE — Telephone Encounter (Signed)
Thanks, will refill medication.  Mindy, do you know what happened with the CT I ordered in the past? I don't think he is scheduled for this yet.  Thanks

## 2022-05-21 NOTE — Telephone Encounter (Signed)
Per [facility] they are needing a refill on Voquezna 20 mg. Patient last ov 03/23/2022, and Egd on 04/16/2022 per Egd note Start 20 mg daily for 12 weeks,then decrease to 10 mg daily. Please advise.

## 2022-05-22 ENCOUNTER — Telehealth (INDEPENDENT_AMBULATORY_CARE_PROVIDER_SITE_OTHER): Payer: Self-pay

## 2022-05-22 ENCOUNTER — Other Ambulatory Visit (INDEPENDENT_AMBULATORY_CARE_PROVIDER_SITE_OTHER): Payer: Self-pay

## 2022-05-22 NOTE — Telephone Encounter (Signed)
Thank you :)

## 2022-05-22 NOTE — Telephone Encounter (Signed)
I spoke with the patient and he says he is taking Voquezna, I advised if on Voquezna to stop ppi. Patient states understanding.

## 2022-05-22 NOTE — Telephone Encounter (Signed)
Thanks

## 2022-05-22 NOTE — Telephone Encounter (Signed)
Says he had done 05/20/22 but report not in yet

## 2022-06-01 ENCOUNTER — Other Ambulatory Visit: Payer: Self-pay

## 2022-06-01 ENCOUNTER — Telehealth: Payer: Self-pay

## 2022-06-01 ENCOUNTER — Other Ambulatory Visit (INDEPENDENT_AMBULATORY_CARE_PROVIDER_SITE_OTHER): Payer: Self-pay | Admitting: Gastroenterology

## 2022-06-01 DIAGNOSIS — D490 Neoplasm of unspecified behavior of digestive system: Secondary | ICD-10-CM

## 2022-06-01 DIAGNOSIS — D3A092 Benign carcinoid tumor of the stomach: Secondary | ICD-10-CM

## 2022-06-01 NOTE — Telephone Encounter (Signed)
-----   Message from [name]., MD sent at 06/01/2022  2:09 PM EST ----- DCM, Happy to be available for this procedure.  I have reviewed the CT scan.  Not clear why there would be question pneumatosis but if he is doing well and not having any symptoms then maybe this is just gastric folds and some question read from our radiology colleagues.  In either case, we can remove the gastric neuroendocrine tumor as well as evaluate for any lymph nodes. I recommend you obtain a chromogranin A and gastrin level (if possible off PPI) if not possible then at least get some baselines. I will have [name] work on scheduling the EUS with EMR. I am okay with the patient coming in to the procedure directly but if he wants to talk with me that is fine.  [name], Please work on scheduling this patient for EUS with EMR 75-minute case for gastric neuroendocrine tumor.  Okay for direct procedure unless he wants to see me in clinic. Please update Dr. Jenetta Downer and myself when he has been scheduled. GM ----- Message ----- From: Harvel Quale, MD Sent: 05/26/2022   5:20 PM EST To: [name]., MD  [name], I just received the results of the CT scan of this patient.  We discussed about him a few weeks ago, he has a small neuroendocrine tumor, no presence of metastasis anywhere.  He is interested in undergoing endoscopic mucosal resection.  Please help [place] with the removal of this tomorrow.  FYI, he would like to have his Nissen fundoplication redone in the future.  Do not think this will be an issue with his neuroendocrine tumor.  Thanks  [name] ----- Message ----- From: Buel Ream, Rad Results In Sent: 05/22/2022  10:00 PM EST To: Harvel Quale, MD

## 2022-06-01 NOTE — Telephone Encounter (Signed)
EUS EMR has been scheduled for 07/16/22 at 915 am with GM at [facility]   Left message on machine to call back

## 2022-06-01 NOTE — Telephone Encounter (Signed)
EUS scheduled, pt instructed and medications reviewed.  Patient instructions mailed to home.  Patient to call with any questions or concerns.  

## 2022-06-01 NOTE — Addendum Note (Signed)
Addended by: Harvel Quale on: 06/01/2022 02:19 PM   Modules accepted: Orders

## 2022-06-02 NOTE — Telephone Encounter (Signed)
Inbound call from patient returning call from previous note. Please advise.  Patient states he will cal back after 4 pm, when he gets off of work.  Thank you

## 2022-06-09 DIAGNOSIS — D3A092 Benign carcinoid tumor of the stomach: Secondary | ICD-10-CM | POA: Diagnosis not present

## 2022-06-12 ENCOUNTER — Other Ambulatory Visit (INDEPENDENT_AMBULATORY_CARE_PROVIDER_SITE_OTHER): Payer: Self-pay

## 2022-06-12 DIAGNOSIS — R140 Abdominal distension (gaseous): Secondary | ICD-10-CM

## 2022-06-12 DIAGNOSIS — K219 Gastro-esophageal reflux disease without esophagitis: Secondary | ICD-10-CM

## 2022-06-12 DIAGNOSIS — R1010 Upper abdominal pain, unspecified: Secondary | ICD-10-CM

## 2022-06-12 DIAGNOSIS — R110 Nausea: Secondary | ICD-10-CM

## 2022-06-12 MED ORDER — VOQUEZNA 10 MG PO TABS: 10.0000 mg | ORAL_TABLET | Freq: Every day | ORAL | 5 refills | Status: DC

## 2022-06-17 ENCOUNTER — Encounter (INDEPENDENT_AMBULATORY_CARE_PROVIDER_SITE_OTHER): Payer: Self-pay

## 2022-06-19 LAB — GASTRIN: Gastrin: 423 pg/mL — ABNORMAL HIGH (ref <=100)

## 2022-06-19 LAB — CHROMOGRANIN A, LC/MS/MS: Chromogranin A, LC/MS/MS: 1228 ng/mL — ABNORMAL HIGH (ref <311)

## 2022-06-22 ENCOUNTER — Encounter (INDEPENDENT_AMBULATORY_CARE_PROVIDER_SITE_OTHER): Payer: Self-pay | Admitting: Gastroenterology

## 2022-06-22 ENCOUNTER — Ambulatory Visit (INDEPENDENT_AMBULATORY_CARE_PROVIDER_SITE_OTHER): Payer: BC Managed Care – PPO | Admitting: Gastroenterology

## 2022-06-22 VITALS — BP 132/78 | HR 79 | Ht 73.0 in | Wt 244.4 lb

## 2022-06-22 DIAGNOSIS — D490 Neoplasm of unspecified behavior of digestive system: Secondary | ICD-10-CM | POA: Insufficient documentation

## 2022-06-22 DIAGNOSIS — D3A092 Benign carcinoid tumor of the stomach: Secondary | ICD-10-CM | POA: Diagnosis not present

## 2022-06-22 DIAGNOSIS — R110 Nausea: Secondary | ICD-10-CM | POA: Diagnosis not present

## 2022-06-22 DIAGNOSIS — R140 Abdominal distension (gaseous): Secondary | ICD-10-CM

## 2022-06-22 DIAGNOSIS — K219 Gastro-esophageal reflux disease without esophagitis: Secondary | ICD-10-CM | POA: Diagnosis not present

## 2022-06-22 MED ORDER — ONDANSETRON HCL 4 MG PO TABS: 4.0000 mg | ORAL_TABLET | Freq: Three times a day (TID) | ORAL | 1 refills | Status: DC | PRN

## 2022-06-22 MED ORDER — SIMETHICONE 80 MG PO TABS: 80.0000 mg | ORAL_TABLET | Freq: Four times a day (QID) | ORAL | 1 refills | Status: AC | PRN

## 2022-06-22 NOTE — Patient Instructions (Signed)
We will continue with Austin Woods for now I have sent simethicone 80mg  to take every 6 hours as needed for gas/bloating and zofran 4mg  to take up to every 8 hours as needed for nausea Please keep scheduled EUS with Dr. Rush Landmark in April, further recommendations to follow once this procedure is done

## 2022-06-22 NOTE — Progress Notes (Signed)
Referring Provider: Richelle Ito Primary Care Physician:  Richelle Ito Primary GI Physician: Jenetta Downer   Chief Complaint  Patient presents with   Gastroesophageal Reflux    Pt states acid reflux is getting worse. Bloating, unable to eat, acid coming up   HPI:   Austin Woods is a 57 y.o. male with past medical history of A-fib, asthma, iron-deficiency anemia, GERD s/p Nissen fundoplication, hypertension, diabetes, Wolff-Parkinson-White status post ablation, coronary artery disease, OSA,   Patient presenting today for follow up of GERD.  Last seen December 2023, at that time, having frequent heartburn, almost daily, persistent dry cough. Using esomperazole BID. Abdomen felt tight.   Recommended to continue nexium BID, ph impedence testing/manometry in the future. Schedule EGD, pepcid for breakthrough.  EGD as outlined below, patient started on Voquezna 20mg  daily x12 weeks then advised to decrease to 10mg  daily. Patient scheduled for EUS with Dr. Rush Landmark in April for findings of neuroendocrine tumor.   Present:  Patient states that he is having a lot of nausea since changing to voquezna. Reports appetite is not great. He feels full all the time. He notes that he is feeling very bloated and full of gas. He is feeling heartburn/acid regurgitation everyday. He is not taking anything for breakthrough symptoms. He notes that he ran out of the voquezna x2 weeks and took pantoprazole in the meantime. He did not notice much difference the time he was on voquezna. Was also off of voquezna for a week prior to gastrin and chromagranin levels (notably elevated on 3/5 at 423 and 1228 respectively)  He notes that he is having diarrhea everytime he eats. He is not taking anything for his diarrhea right now. No rectal bleeding or melena.   CT A/P 05/20/22: No acute intra-abdominal or pelvic pathology. No evidence of metastatic disease in the abdomen or pelvis. 2. Possible mild  gastric pneumatosis of indeterminate etiology and may be related to recent instrumentation. No inflammatory changes or gastric wall thickening. No free air or portal venous gas. 3. Fatty liver.  Last Colonoscopy: 04/2019 - Perianal skin tags found on perianal exam. - The examined portion of the ileum was normal. - Two diminutive polyps at the splenic flexure and in the ascending colon. Biopsied. - Diverticulosis at the hepatic flexure. - External hemorrhoids.   Path: COLON, SPLENIC FLEXURE, ASCENDING, POLYPECTOMY:  - Tubular adenoma without high-grade dysplasia or malignancy  - Other fragment of polypoid colonic mucosa with no specific  histopathologic changes    Recommended repeat in 7 years.  Last Endoscopy: - 1 cm hiatal hernia.                           - Nissen fundoplication was found, characterized by                            loose wrap.                           - Gastroesophageal flap valve classified as Hill                            Grade II (fold present, opens with respiration).                           -  A single mucosal papule (nodule) found in the                            stomach. Query GIST? Biopsied-neuroendocrine tumor                           - Normal examined duodenum.  Recommendations:    Past Medical History:  Diagnosis Date   A-fib ([place])    Anemia    iron def anemia   Arthritis    Asthma    Coronary artery disease    Dysrhythmia    in and out Afib   GERD (gastroesophageal reflux disease)    Headache    High cholesterol    History of DVT (deep vein thrombosis)    a. left leg following ablation for SVT   Hypertension    Persistent atrial fibrillation ([place])    a. Dx 08/2012, xarelto initiated; b. 09/2012 Tikosyn initiated -> DCCV -> Sinus c. PVI 03/2013 d. PVI 03/2014   Sleep apnea    USES  CPAP    Type 2 diabetes mellitus without complication, without long-term current use of insulin ([place]) 02/16/2018   Wolff-Parkinson-White (WPW) syndrome     resolved s/p RFCA 2001 by Dr Caryl Comes    Past Surgical History:  Procedure Laterality Date   ATRIAL FIBRILLATION ABLATION  03/23/2013   PVI by DR Rayann Heman for afib   ATRIAL FIBRILLATION ABLATION N/A 03/23/2013   Procedure: ATRIAL FIBRILLATION ABLATION;  Surgeon: Coralyn Mark, MD;  Location: [facility] CATH LAB;  Service: Cardiovascular;  Laterality: N/A;   ATRIAL FIBRILLATION ABLATION N/A 03/27/2014   PVI Dr Norman Herrlich SURGERY     BIOPSY  02/28/2020   Procedure: BIOPSY;  Surgeon: Rogene Houston, MD;  [facility];  Service: Endoscopy;;  antral(pre-pyloric)    BIOPSY  04/16/2022   Procedure: BIOPSY;  Surgeon: Harvel Quale, MD;  [facility];  Service: Gastroenterology;;   CARDIAC CATHETERIZATION N/A 02/03/2016   Procedure: Left Heart Cath and Coronary Angiography;  Surgeon: Nelva Bush, MD;  Location: [facility] CV LAB;  Service: Cardiovascular;  Laterality: N/A;   CARDIAC CATHETERIZATION N/A 02/03/2016   Procedure: Intravascular Pressure Wire/FFR Study;  Surgeon: Nelva Bush, MD;  Location: [place] CV LAB;  Service: Cardiovascular;  Laterality: N/A;   CARDIAC CATHETERIZATION N/A 02/03/2016   Procedure: Coronary Stent Intervention;  Surgeon: Nelva Bush, MD;  Location: [place] CV LAB;  Service: Cardiovascular;  Laterality: N/A;   CARDIAC SURGERY     CARDIOVERSION N/A 08/25/2012   Procedure: CARDIOVERSION;  Surgeon: Thayer Headings, MD;  Location: [facility];  Service: Cardiovascular;  Laterality: N/A;   CARDIOVERSION N/A 09/06/2012   Procedure: CARDIOVERSION/Bedside;  Surgeon: Carlena Bjornstad, MD;  Location: [facility];  Service: Cardiovascular;  Laterality: N/A;   CARDIOVERSION N/A 01/12/2014   Procedure: CARDIOVERSION;  Surgeon: Pixie Casino, MD;  Location: [facility] ENDOSCOPY;  Service: Cardiovascular;  Laterality: N/A;   CARDIOVERSION N/A 01/30/2016   Procedure: CARDIOVERSION;  Surgeon: Sueanne Margarita, MD;  Location: [facility] ENDOSCOPY;  Service:  Cardiovascular;  Laterality: N/A;   CARDIOVERSION N/A 04/14/2016   Procedure: CARDIOVERSION;  Surgeon: Pixie Casino, MD;  Location: [facility] ENDOSCOPY;  Service: Cardiovascular;  Laterality: N/A;   COLONOSCOPY N/A 12/04/2015   Procedure: COLONOSCOPY;  Surgeon: Rogene Houston, MD;  [facility];  Service: Endoscopy;  Laterality: N/A;  730   COLONOSCOPY WITH PROPOFOL N/A 04/28/2019   Procedure: COLONOSCOPY WITH PROPOFOL;  Surgeon: Rogene Houston, MD;  [facility];  Service: Endoscopy;  Laterality: N/A;   CORONARY ANGIOPLASTY     CORONARY ANGIOPLASTY WITH STENT PLACEMENT     ELECTROPHYSIOLOGIC STUDY N/A 02/18/2016   Procedure: Atrial Fibrillation Ablation;  Surgeon: Thompson Grayer, MD;  Location: [place] CV LAB;  Service: Cardiovascular;  Laterality: N/A;   ESOPHAGOGASTRODUODENOSCOPY (EGD) WITH PROPOFOL N/A 04/28/2019   Procedure: ESOPHAGOGASTRODUODENOSCOPY (EGD) WITH PROPOFOL;  Surgeon: Rogene Houston, MD;  [facility];  Service: Endoscopy;  Laterality: N/A;  7:30   ESOPHAGOGASTRODUODENOSCOPY (EGD) WITH PROPOFOL N/A 02/28/2020   Procedure: ESOPHAGOGASTRODUODENOSCOPY (EGD) WITH PROPOFOL;  Surgeon: Rogene Houston, MD;  [facility];  Service: Endoscopy;  Laterality: N/A;  1245   ESOPHAGOGASTRODUODENOSCOPY (EGD) WITH PROPOFOL N/A 04/16/2022   Procedure: ESOPHAGOGASTRODUODENOSCOPY (EGD) WITH PROPOFOL;  Surgeon: Harvel Quale, MD;  [facility];  Service: Gastroenterology;  Laterality: N/A;  730am, asa 1-2   implantable loop recorder removal  03/01/2019   MDT Reveal LINQ removed in office   INGUINAL HERNIA REPAIR Bilateral 06/04/2017   Procedure: OPEN BILATERAL INGUINAL HERNIA REPAIR;  Surgeon: Judeth Horn, MD;  Location: [facility];  Service: General;  Laterality: Bilateral;   LOOP RECORDER IMPLANT N/A 06/29/2014   Procedure: LOOP RECORDER IMPLANT;  Surgeon: Thompson Grayer, MD;  Location: [facility] CATH LAB;  Service: Cardiovascular;  Laterality:  N/A;   NOSE SURGERY     sleep apnea surgery   PERIPHERAL VASCULAR CATHETERIZATION N/A 03/13/2016   Procedure: Thrombin Injection;  Surgeon: Serafina Mitchell, MD;  Location: [place] CV LAB;  Service: Cardiovascular;  Laterality: N/A;   POLYPECTOMY  04/28/2019   Procedure: POLYPECTOMY;  Surgeon: Rogene Houston, MD;  [facility];  Service: Endoscopy;;  duodenum;splenic flexure;   STOMACH SURGERY     reflux, fundoplication   TEE WITHOUT CARDIOVERSION N/A 08/25/2012   Procedure: TRANSESOPHAGEAL ECHOCARDIOGRAM (TEE);  Surgeon: Thayer Headings, MD;  Location: [place];  Service: Cardiovascular;  Laterality: N/A;   TEE WITHOUT CARDIOVERSION  03/23/2013   DR ROSS   TEE WITHOUT CARDIOVERSION N/A 03/23/2013   Procedure: TRANSESOPHAGEAL ECHOCARDIOGRAM (TEE);  Surgeon: Fay Records, MD;  Location: [facility] ENDOSCOPY;  Service: Cardiovascular;  Laterality: N/A;   TEE WITHOUT CARDIOVERSION N/A 03/26/2014   Procedure: TRANSESOPHAGEAL ECHOCARDIOGRAM (TEE);  Surgeon: Josue Hector, MD;  Location: [facility] ENDOSCOPY;  Service: Cardiovascular;  Laterality: N/A;   TONSILLECTOMY      Current Outpatient Medications  Medication Sig Dispense Refill   albuterol (VENTOLIN HFA) 108 (90 Base) MCG/ACT inhaler Inhale 2 puffs into the lungs every 6 (six) hours as needed for wheezing or shortness of breath. 8 g 0   atorvastatin (LIPITOR) 40 MG tablet Take 1 tablet (40 mg total) by mouth daily. 90 tablet 3   azelastine (ASTELIN) 0.1 % nasal spray 1 puff in each nostril Nasally Twice a day     blood glucose meter kit and supplies Dispense based on patient and insurance preference. Use to check blood sugar once a day(FOR ICD-10 E10.9, E11.9). 1 each 0   Blood Glucose Monitoring Suppl (CONTOUR NEXT MONITOR) w/Device KIT as directed per package instructions twice a day     Cholecalciferol (VITAMIN D) 2000 UNITS CAPS Take 4,000 Units by mouth daily.      dofetilide (TIKOSYN) 500 MCG capsule TAKE 1 CAPSULE BY MOUTH TWICE   DAILY 180 capsule  1   enalapril (VASOTEC) 10 MG tablet Take 1 tablet (10 mg total) by mouth 2 (two) times daily. 180 tablet 3   fluticasone (FLONASE) 50 MCG/ACT nasal spray Place 1 spray into both nostrils daily as needed for allergies.      Fluticasone-Salmeterol (ADVAIR DISKUS) 250-50 MCG/DOSE AEPB Inhale 1 puff into the lungs every 12 (twelve) hours. 60 each 0   furosemide (LASIX) 40 MG tablet Take 1 tablet (40 mg total) by mouth as needed.     glucose blood (CONTOUR NEXT TEST) test strip as directed per package instructions In Vitro twice a day     hydrOXYzine (VISTARIL) 25 MG capsule Take 2 capsules (50 mg total) by mouth 3 (three) times daily as needed for anxiety. 30 capsule 0   loratadine (CLARITIN) 10 MG tablet Take 10 mg by mouth daily as needed for allergies.      Magnesium Oxide 400 MG CAPS Take one tablet by mouth twice daily (Patient taking differently: Take one tablet by mouth daily) 60 capsule 9   meloxicam (MOBIC) 15 MG tablet Take 15 mg by mouth as needed.     metFORMIN (GLUCOPHAGE) 1000 MG tablet TAKE 1 TABLET BY MOUTH TWICE  DAILY WITH MEALS 30 tablet 0   metoprolol tartrate (LOPRESSOR) 50 MG tablet Take 50 mg by mouth daily.     nitroGLYCERIN (NITROSTAT) 0.4 MG SL tablet DISSOLVE 1 TABLET UNDER THE TONGUE EVERY 5 MINUTES AS  NEEDED FOR CHEST PAIN. MAX  OF 3 TABLETS IN 15 MINUTES. CALL 911 IF PAIN PERSISTS. 25 tablet 1   oxybutynin (DITROPAN) 5 MG tablet TAKE ONE-HALF TABLET (2.5mg ) BY  MOUTH TWO TIMES DAILY AS  NEEDED FOR BLADDER SPASMS 30 tablet 5   potassium chloride SA (KLOR-CON) 20 MEQ tablet TAKE 1 TABLET BY MOUTH  DAILY 90 tablet 3   rivaroxaban (XARELTO) 20 MG TABS tablet TAKE 1 TABLET BY MOUTH ONCE DAILY WITH SUPPER 30 tablet 5   vitamin B-12 (CYANOCOBALAMIN) 500 MCG tablet Take 500 mcg by mouth daily.     Vonoprazan Fumarate (VOQUEZNA) 10 MG TABS Take 10 mg by mouth daily at 6 (six) AM. 30 tablet 5   No current facility-administered medications for this visit.     Allergies as of 06/22/2022   (No Known Allergies)    Family History  Problem Relation Age of Onset   CAD Father 6   Diabetes Father    Heart attack Father    Transient ischemic attack Father    Vascular Disease Father    Diabetes Mother    Anemia Mother    Gout Mother    Aneurysm Maternal Grandmother    Stroke Maternal Grandfather    Diabetes Paternal Grandmother    Stroke Paternal Grandfather    Diabetes Brother     Social History   Socioeconomic History   Marital status: Married    Spouse name: Not on file   Number of children: 2   Years of education: Not on file   Highest education level: Not on file  Occupational History    Employer: [employer]  Tobacco Use   Smoking status: Former    Packs/day: 1.00    Years: 7.00    Additional pack years: 0.00    Total pack years: 7.00    Types: Cigarettes    Start date: 05/19/2003    Quit date: 08/06/2010    Years since quitting: 11.8   Smokeless tobacco: Never  Vaping Use   Vaping Use: Never used  Substance and Sexual Activity   Alcohol use: No    Alcohol/week: 0.0 standard drinks of alcohol   Drug use: No   Sexual activity: Not on file  Other Topics Concern   Not on file  Social History Narrative   Pt lives in [place] with wife.  Works at [employer] of [employer]: Not on [employer] Insecurity: Not on file  Transportation Needs: Not on file  Physical Activity: Not on file  Stress: Not on file  Social Connections: Not on file    Review of systems General: negative for malaise, night sweats, fever, chills, weight loss Neck: Negative for lumps, goiter, pain and significant neck swelling Resp: Negative for cough, wheezing, dyspnea at rest CV: Negative for chest pain, leg swelling, palpitations, orthopnea GI: denies melena, hematochezia, constipation, dysphagia, odyonophagia, early satiety or unintentional weight loss. +nausea +diarrhea +gerd symptoms +bloating  MSK:  Negative for joint pain or swelling, back pain, and muscle pain. Derm: Negative for itching or rash Psych: Denies depression, anxiety, memory loss, confusion. No homicidal or suicidal ideation.  Heme: Negative for prolonged bleeding, bruising easily, and swollen nodes. Endocrine: Negative for cold or heat intolerance, polyuria, polydipsia and goiter. Neuro: negative for tremor, gait imbalance, syncope and seizures. The remainder of the review of systems is noncontributory.  Physical Exam: BP 132/78   Pulse 79   Ht 6\' 1"  (1.854 m)   Wt 244 lb 6.4 oz (110.9 kg)   BMI 32.24 kg/m  General:   Alert and oriented. No distress noted. Pleasant and cooperative.  Head:  Normocephalic and atraumatic. Eyes:  Conjuctiva clear without scleral icterus. Mouth:  Oral mucosa pink and moist. Good dentition. No lesions. Heart: Normal rate and rhythm, s1 and s2 heart sounds present.  Lungs: Clear lung sounds in all lobes. Respirations equal and unlabored. Abdomen:  +BS, soft, non-tender and non-distended. No rebound or guarding. No HSM or masses noted. Derm: No palmar erythema or jaundice Msk:  Symmetrical without gross deformities. Normal posture. Extremities:  Without edema. Neurologic:  Alert and  oriented x4 Psych:  Alert and cooperative. Normal mood and affect.  Invalid input(s): "6 MONTHS"   ASSESSMENT: EDON YELINEK is a 57 y.o. male presenting today for follow up of GERD/bloating/nausea.   Has not felt any improvement on voquezna, having diarrhea, bloating, nausea. 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 Rush Landmark in April. Symptoms could be due in part to NET and/or voquezna as it is known to cause GI upset/bloating/nausea.  At this time he just got a refill and does not wish to change PPI therapy. Will send simethicone 80mg  q6h prn for gas/bloating and  zofran 4mg  q8h PRN for nausea. Further recommendations to follow EUS.    PLAN:  Keep EUS appt in April  2.  Continue voquezna 10mg  daily  3. Simethicone 80mg  q6h  4. Zofran 4mg  Q8h PRN for nausea   All questions were answered, patient verbalized understanding and is in agreement with plan as outlined above.    Follow Up: 3 months   Austin Woods L. Alver Sorrow, MSN, APRN, AGNP-C Adult-Gerontology Nurse Practitioner [facility]  I have reviewed the note and agree with the APP's assessment as described in this progress note  Austin Peppers, MD Gastroenterology and Hepatology [facility] Gastroenterology

## 2022-06-23 ENCOUNTER — Telehealth (INDEPENDENT_AMBULATORY_CARE_PROVIDER_SITE_OTHER): Payer: Self-pay

## 2022-06-23 NOTE — Telephone Encounter (Signed)
I spoke with Austin Woods she say even though it is showing the patient was denied, they have reached out to the patient and set up shipment, which shipped out on 06/19/2022 for the Voquezna 10 mg, patient made aware the cost will be $50.00 and there is nothing further we need to do.   I spoke with [name] the drug rep whom has reached out to Justin the field access rep for this area she will have Austin Woods reach out to me.

## 2022-06-23 NOTE — Telephone Encounter (Signed)
I called Austin Woods the drug rep asked that she please call me to see what we need to do.

## 2022-06-23 NOTE — Telephone Encounter (Signed)
Dear Austin Woods, We have reviewed a prior authorization request for the member listed on this letter. Here is a summary of the decision and the next steps to take. What is a prior authorization? A prior authorization is an approval from the pharmacy benefit manager or plan to cover the cost of a medication. Why does my plan want me to get a prior authorization? A Safety: You may be taking the medication for a different condition than it was designed for. A Effectiveness: Your medication may not work as intended with other medications you take. A Cost control: Your plan may cover a lower-cost option that works the same way. SUMMARY OF PRIOR AUTHORIZATION DECISION DECISION: DENIED The U.S. Food and Drug Administration (FDA) has not approved this medication for your condition. REASON: This decision is based on your plan's drug coverage policy for this medication. Please see the DECISION NOTES AND DETAILS section for more information. DRUG NAME: Voquezna Tab 10mg  Austin Woods PATIENT INFO: Member ID: [id number] Case ID: [id number] PROVIDER: Maylon Peppers [place] [phone] QUESTIONS? We're here to help. Page 2 of 11 Visit [email or web address] or call the number on your member ID card. Case number: [id number] Talk with your provider about one of these options: 1. Your doctor can ask for a reconsideration of the decision. 2. Switch to another medication that's covered by the plan. NEXT STEPS: 3. You or your provider can appeal this decision. See APPEALS section of this notice. 4. You can ask for an external review of this decision. See OTHER

## 2022-07-09 ENCOUNTER — Encounter (HOSPITAL_COMMUNITY): Payer: Self-pay | Admitting: Gastroenterology

## 2022-07-16 ENCOUNTER — Ambulatory Visit (HOSPITAL_COMMUNITY)
Admission: RE | Admit: 2022-07-16 | Discharge: 2022-07-16 | Disposition: A | Discharge status: Home / Self Care | Payer: BC Managed Care – PPO | Source: Ambulatory Visit | Attending: Gastroenterology | Admitting: Gastroenterology

## 2022-07-16 ENCOUNTER — Encounter (HOSPITAL_COMMUNITY)
Admission: RE | Disposition: A | Discharge status: Home / Self Care | Payer: Self-pay | Source: Ambulatory Visit | Attending: Gastroenterology

## 2022-07-16 ENCOUNTER — Ambulatory Visit (HOSPITAL_COMMUNITY): Payer: BC Managed Care – PPO | Admitting: Anesthesiology

## 2022-07-16 ENCOUNTER — Encounter (HOSPITAL_COMMUNITY): Payer: Self-pay | Admitting: Gastroenterology

## 2022-07-16 DIAGNOSIS — Z6831 Body mass index (BMI) 31.0-31.9, adult: Secondary | ICD-10-CM | POA: Diagnosis not present

## 2022-07-16 DIAGNOSIS — I899 Noninfective disorder of lymphatic vessels and lymph nodes, unspecified: Secondary | ICD-10-CM

## 2022-07-16 DIAGNOSIS — K2289 Other specified disease of esophagus: Secondary | ICD-10-CM | POA: Diagnosis not present

## 2022-07-16 DIAGNOSIS — Z79899 Other long term (current) drug therapy: Secondary | ICD-10-CM | POA: Insufficient documentation

## 2022-07-16 DIAGNOSIS — I4891 Unspecified atrial fibrillation: Secondary | ICD-10-CM | POA: Insufficient documentation

## 2022-07-16 DIAGNOSIS — Z7901 Long term (current) use of anticoagulants: Secondary | ICD-10-CM | POA: Diagnosis not present

## 2022-07-16 DIAGNOSIS — J45909 Unspecified asthma, uncomplicated: Secondary | ICD-10-CM | POA: Diagnosis not present

## 2022-07-16 DIAGNOSIS — Z7984 Long term (current) use of oral hypoglycemic drugs: Secondary | ICD-10-CM | POA: Insufficient documentation

## 2022-07-16 DIAGNOSIS — E119 Type 2 diabetes mellitus without complications: Secondary | ICD-10-CM | POA: Diagnosis not present

## 2022-07-16 DIAGNOSIS — G4730 Sleep apnea, unspecified: Secondary | ICD-10-CM | POA: Insufficient documentation

## 2022-07-16 DIAGNOSIS — D3A8 Other benign neuroendocrine tumors: Secondary | ICD-10-CM | POA: Insufficient documentation

## 2022-07-16 DIAGNOSIS — Z87891 Personal history of nicotine dependence: Secondary | ICD-10-CM | POA: Insufficient documentation

## 2022-07-16 DIAGNOSIS — D490 Neoplasm of unspecified behavior of digestive system: Secondary | ICD-10-CM

## 2022-07-16 DIAGNOSIS — K219 Gastro-esophageal reflux disease without esophagitis: Secondary | ICD-10-CM | POA: Diagnosis not present

## 2022-07-16 DIAGNOSIS — I4819 Other persistent atrial fibrillation: Secondary | ICD-10-CM

## 2022-07-16 DIAGNOSIS — Z86718 Personal history of other venous thrombosis and embolism: Secondary | ICD-10-CM | POA: Diagnosis not present

## 2022-07-16 DIAGNOSIS — K3189 Other diseases of stomach and duodenum: Secondary | ICD-10-CM | POA: Diagnosis not present

## 2022-07-16 DIAGNOSIS — Z9889 Other specified postprocedural states: Secondary | ICD-10-CM

## 2022-07-16 DIAGNOSIS — I110 Hypertensive heart disease with heart failure: Secondary | ICD-10-CM | POA: Insufficient documentation

## 2022-07-16 DIAGNOSIS — I2510 Atherosclerotic heart disease of native coronary artery without angina pectoris: Secondary | ICD-10-CM | POA: Diagnosis not present

## 2022-07-16 DIAGNOSIS — K2970 Gastritis, unspecified, without bleeding: Secondary | ICD-10-CM | POA: Insufficient documentation

## 2022-07-16 DIAGNOSIS — K317 Polyp of stomach and duodenum: Secondary | ICD-10-CM | POA: Diagnosis not present

## 2022-07-16 DIAGNOSIS — C7A092 Malignant carcinoid tumor of the stomach: Secondary | ICD-10-CM | POA: Diagnosis not present

## 2022-07-16 HISTORY — PX: ESOPHAGOGASTRODUODENOSCOPY (EGD) WITH PROPOFOL: SHX5813

## 2022-07-16 HISTORY — PX: SUBMUCOSAL LIFTING INJECTION: SHX6855

## 2022-07-16 HISTORY — PX: ENDOSCOPIC MUCOSAL RESECTION: SHX6839

## 2022-07-16 HISTORY — PX: HEMOSTASIS CLIP PLACEMENT: SHX6857

## 2022-07-16 HISTORY — PX: BIOPSY: SHX5522

## 2022-07-16 HISTORY — PX: EUS: SHX5427

## 2022-07-16 LAB — GLUCOSE, CAPILLARY: Glucose-Capillary: 127 mg/dL — ABNORMAL HIGH (ref 70–99)

## 2022-07-16 SURGERY — ESOPHAGOGASTRODUODENOSCOPY (EGD) WITH PROPOFOL
Anesthesia: Monitor Anesthesia Care

## 2022-07-16 MED ORDER — SODIUM CHLORIDE 0.9 % IV SOLN: INTRAVENOUS | Status: DC

## 2022-07-16 MED ORDER — LACTATED RINGERS IV SOLN: INTRAVENOUS | Status: DC

## 2022-07-16 MED ORDER — RIVAROXABAN 20 MG PO TABS
20.0000 mg | ORAL_TABLET | Freq: Every day | ORAL | 5 refills | Status: DC
Start: 2022-07-19 — End: 2022-08-13

## 2022-07-16 MED ORDER — SUCRALFATE 1 GM/10ML PO SUSP: 1.0000 g | Freq: Two times a day (BID) | ORAL | 0 refills | Status: DC

## 2022-07-16 MED ORDER — PROPOFOL 500 MG/50ML IV EMUL
INTRAVENOUS | Status: DC | PRN
  Administered 2022-07-16: 120 ug/kg/min via INTRAVENOUS

## 2022-07-16 MED ORDER — LIDOCAINE 2% (20 MG/ML) 5 ML SYRINGE
INTRAMUSCULAR | Status: DC | PRN
  Administered 2022-07-16: 100 mg via INTRAVENOUS

## 2022-07-16 MED ORDER — PROPOFOL 10 MG/ML IV BOLUS
INTRAVENOUS | Status: DC | PRN
  Administered 2022-07-16: 10 mg via INTRAVENOUS
  Administered 2022-07-16 (×2): 20 mg via INTRAVENOUS

## 2022-07-16 MED ORDER — PROPOFOL 1000 MG/100ML IV EMUL
INTRAVENOUS | Status: AC
  Filled 2022-07-16: qty 100

## 2022-07-16 NOTE — H&P (Signed)
GASTROENTEROLOGY PROCEDURE H&P NOTE   Primary Care Physician: Dion Saucier, PA-C  HPI: Austin Woods is a 57 y.o. male who presents for EGD/EUS for evaluation and potential resection of gastric neuroendocrine tumor.  Past Medical History:  Diagnosis Date   A-fib    Anemia    iron def anemia   Arthritis    Asthma    Coronary artery disease    Dysrhythmia    in and out Afib   GERD (gastroesophageal reflux disease)    Headache    High cholesterol    History of DVT (deep vein thrombosis)    a. left leg following ablation for SVT   Hypertension    Persistent atrial fibrillation    a. Dx 08/2012, xarelto initiated; b. 09/2012 Tikosyn initiated -> DCCV -> Sinus c. PVI 03/2013 d. PVI 03/2014   Sleep apnea    USES  CPAP    Type 2 diabetes mellitus without complication, without long-term current use of insulin 02/16/2018   Wolff-Parkinson-White (WPW) syndrome    resolved s/p RFCA 2001 by Dr Graciela Husbands   Past Surgical History:  Procedure Laterality Date   ATRIAL FIBRILLATION ABLATION  03/23/2013   PVI by DR Johney Frame for afib   ATRIAL FIBRILLATION ABLATION N/A 03/23/2013   Procedure: ATRIAL FIBRILLATION ABLATION;  Surgeon: Gardiner Rhyme, MD;  Location: [facility] CATH LAB;  Service: Cardiovascular;  Laterality: N/A;   ATRIAL FIBRILLATION ABLATION N/A 03/27/2014   PVI Dr Allyne Gee SURGERY     BIOPSY  02/28/2020   Procedure: BIOPSY;  Surgeon: Malissa Hippo, MD;  Location: [facility]O [facility];  Service: Endoscopy;;  antral(pre-pyloric)    BIOPSY  04/16/2022   Procedure: BIOPSY;  Surgeon: Dolores Frame, MD;  [facility]: [facility];  Service: Gastroenterology;;   CARDIAC CATHETERIZATION N/A 02/03/2016   Procedure: Left Heart Cath and Coronary Angiography;  Surgeon: Yvonne Kendall, MD;  Location: [facility] INVASIVE CV LAB;  Service: Cardiovascular;  Laterality: N/A;   CARDIAC CATHETERIZATION N/A 02/03/2016   Procedure: Intravascular Pressure Wire/FFR Study;  Surgeon: Yvonne Kendall,  MD;  Location: [facility] INVASIVE CV LAB;  Service: Cardiovascular;  Laterality: N/A;   CARDIAC CATHETERIZATION N/A 02/03/2016   Procedure: Coronary Stent Intervention;  Surgeon: Yvonne Kendall, MD;  Location: [facility] INVASIVE CV LAB;  Service: Cardiovascular;  Laterality: N/A;   CARDIAC SURGERY     CARDIOVERSION N/A 08/25/2012   Procedure: CARDIOVERSION;  Surgeon: Vesta Mixer, MD;  Location: [facility] ENDOSCOPY;  Service: Cardiovascular;  Laterality: N/A;   CARDIOVERSION N/A 09/06/2012   Procedure: CARDIOVERSION/Bedside;  Surgeon: Luis Abed, MD;  Location: [facility] OR;  Service: Cardiovascular;  Laterality: N/A;   CARDIOVERSION N/A 01/12/2014   Procedure: CARDIOVERSION;  Surgeon: Chrystie Nose, MD;  Location: [facility] ENDOSCOPY;  Service: Cardiovascular;  Laterality: N/A;   CARDIOVERSION N/A 01/30/2016   Procedure: CARDIOVERSION;  Surgeon: Quintella Reichert, MD;  Location: [facility] ENDOSCOPY;  Service: Cardiovascular;  Laterality: N/A;   CARDIOVERSION N/A 04/14/2016   Procedure: CARDIOVERSION;  Surgeon: Chrystie Nose, MD;  Location: [facility] ENDOSCOPY;  Service: Cardiovascular;  Laterality: N/A;   COLONOSCOPY N/A 12/04/2015   Procedure: COLONOSCOPY;  Surgeon: Malissa Hippo, MD;  [facility];  Service: Endoscopy;  Laterality: N/A;  730   COLONOSCOPY WITH PROPOFOL N/A 04/28/2019   Procedure: COLONOSCOPY WITH PROPOFOL;  Surgeon: Malissa Hippo, MD;  [facility];  Service: Endoscopy;  Laterality: N/A;   CORONARY ANGIOPLASTY     CORONARY ANGIOPLASTY WITH  STENT PLACEMENT     ELECTROPHYSIOLOGIC STUDY N/A 02/18/2016   Procedure: Atrial Fibrillation Ablation;  Surgeon: Hillis Range, MD;  Location: [facility] INVASIVE CV LAB;  Service: Cardiovascular;  Laterality: N/A;   ESOPHAGOGASTRODUODENOSCOPY (EGD) WITH PROPOFOL N/A 04/28/2019   Procedure: ESOPHAGOGASTRODUODENOSCOPY (EGD) WITH PROPOFOL;  Surgeon: Malissa Hippo, MD;  [facility];  Service: Endoscopy;  Laterality: N/A;  7:30   ESOPHAGOGASTRODUODENOSCOPY  (EGD) WITH PROPOFOL N/A 02/28/2020   Procedure: ESOPHAGOGASTRODUODENOSCOPY (EGD) WITH PROPOFOL;  Surgeon: Malissa Hippo, MD;  [facility];  Service: Endoscopy;  Laterality: N/A;  1245   ESOPHAGOGASTRODUODENOSCOPY (EGD) WITH PROPOFOL N/A 04/16/2022   Procedure: ESOPHAGOGASTRODUODENOSCOPY (EGD) WITH PROPOFOL;  Surgeon: Dolores Frame, MD;  [facility]: [facility];  Service: Gastroenterology;  Laterality: N/A;  730am, asa 1-2   implantable loop recorder removal  03/01/2019   MDT Reveal LINQ removed in office   INGUINAL HERNIA REPAIR Bilateral 06/04/2017   Procedure: OPEN BILATERAL INGUINAL HERNIA REPAIR;  Surgeon: Jimmye Norman, MD;  Location: [facility] OR;  Service: General;  Laterality: Bilateral;   LOOP RECORDER IMPLANT N/A 06/29/2014   Procedure: LOOP RECORDER IMPLANT;  Surgeon: Hillis Range, MD;  Location: [facility] CATH LAB;  Service: Cardiovascular;  Laterality: N/A;   NOSE SURGERY     sleep apnea surgery   PERIPHERAL VASCULAR CATHETERIZATION N/A 03/13/2016   Procedure: Thrombin Injection;  Surgeon: Nada Libman, MD;  Location: [facility] INVASIVE CV LAB;  Service: Cardiovascular;  Laterality: N/A;   POLYPECTOMY  04/28/2019   Procedure: POLYPECTOMY;  Surgeon: Malissa Hippo, MD;  [facility];  Service: Endoscopy;;  duodenum;splenic flexure;   STOMACH SURGERY     reflux, fundoplication   TEE WITHOUT CARDIOVERSION N/A 08/25/2012   Procedure: TRANSESOPHAGEAL ECHOCARDIOGRAM (TEE);  Surgeon: Vesta Mixer, MD;  Location: [facility] ENDOSCOPY;  Service: Cardiovascular;  Laterality: N/A;   TEE WITHOUT CARDIOVERSION  03/23/2013   DR ROSS   TEE WITHOUT CARDIOVERSION N/A 03/23/2013   Procedure: TRANSESOPHAGEAL ECHOCARDIOGRAM (TEE);  Surgeon: Pricilla Riffle, MD;  Location: [facility] ENDOSCOPY;  Service: Cardiovascular;  Laterality: N/A;   TEE WITHOUT CARDIOVERSION N/A 03/26/2014   Procedure: TRANSESOPHAGEAL ECHOCARDIOGRAM (TEE);  Surgeon: Wendall Stade, MD;  Location: [facility] ENDOSCOPY;  Service:  Cardiovascular;  Laterality: N/A;   TONSILLECTOMY     Current Facility-Administered Medications  Medication Dose Route Frequency Provider Last Rate Last Admin   0.9 %  sodium chloride infusion   Intravenous Continuous Mansouraty, Netty Starring., MD       lactated ringers infusion   Intravenous Continuous Mansouraty, Netty Starring., MD 10 mL/hr at 07/16/22 0813 New Bag at 07/16/22 0813    Current Facility-Administered Medications:    0.9 %  sodium chloride infusion, , Intravenous, Continuous, Mansouraty, Netty Starring., MD   lactated ringers infusion, , Intravenous, Continuous, Mansouraty, Netty Starring., MD, Last Rate: 10 mL/hr at 07/16/22 0813, New Bag at 07/16/22 0813 No Known Allergies Family History  Problem Relation Age of Onset   CAD Father 30   Diabetes Father    Heart attack Father    Transient ischemic attack Father    Vascular Disease Father    Diabetes Mother    Anemia Mother    Gout Mother    Aneurysm Maternal Grandmother    Stroke Maternal Grandfather    Diabetes Paternal Grandmother    Stroke Paternal Grandfather    Diabetes Brother    Social History   Socioeconomic History   Marital status: Married    Spouse name:  Not on file   Number of children: 2   Years of education: Not on file   Highest education level: Not on file  Occupational History    Employer: [employer]  Tobacco Use   Smoking status: Former    Packs/day: 1.00    Years: 7.00    Additional pack years: 0.00    Total pack years: 7.00    Types: Cigarettes    Start date: 05/19/2003    Quit date: 08/06/2010    Years since quitting: 11.9   Smokeless tobacco: Never  Vaping Use   Vaping Use: Never used  Substance and Sexual Activity   Alcohol use: No    Alcohol/week: 0.0 standard drinks of alcohol   Drug use: No   Sexual activity: Not on file  Other Topics Concern   Not on file  Social History Narrative   Pt lives in [place] with wife.  Works at [employer]    Social Determinants of [employer]   Financial Resource  Strain: Not on BB&[employer] Corporationfile  Food Insecurity: Not on file  Transportation Needs: Not on file  Physical Activity: Not on file  Stress: Not on file  Social Connections: Not on file  Intimate Partner Violence: Not on file    Physical Exam: Today's Vitals   07/16/22 0808  BP: (!) 175/72  Pulse: (!) 58  Resp: 16  Temp: (!) 97.1 F (36.2 C)  TempSrc: Tympanic  SpO2: 98%  Weight: 108.9 kg  Height: 6\' 1"  (1.854 m)  PainSc: 0-No pain   Body mass index is 31.66 kg/m. GEN: NAD EYE: Sclerae anicteric ENT: MMM CV: Non-tachycardic GI: Soft, NT/ND NEURO:  Alert & Oriented x 3  Lab Results: No results for input(s): "WBC", "HGB", "HCT", "PLT" in the last 72 hours. BMET No results for input(s): "NA", "K", "CL", "CO2", "GLUCOSE", "BUN", "CREATININE", "CALCIUM" in the last 72 hours. LFT No results for input(s): "PROT", "ALBUMIN", "AST", "ALT", "ALKPHOS", "BILITOT", "BILIDIR", "IBILI" in the last 72 hours. PT/INR No results for input(s): "LABPROT", "INR" in the last 72 hours.   Impression / Plan: This is a 57 y.o.male who presents for EGD/EUS for evaluation and potential resection of gastric neuroendocrine tumor.  The risks of an EUS including intestinal perforation, bleeding, infection, aspiration, and medication effects were discussed as was the possibility it may not give a definitive diagnosis if a biopsy is performed.  When a biopsy of the pancreas is done as part of the EUS, there is an additional risk of pancreatitis at the rate of about 1-2%.  It was explained that procedure related pancreatitis is typically mild, although it can be severe and even life threatening, which is why we do not perform random pancreatic biopsies and only biopsy a lesion/area we feel is concerning enough to warrant the risk.  The risks and benefits of endoscopic evaluation/treatment were discussed with the patient and/or family; these include but are not limited to the risk of perforation, infection, bleeding,  missed lesions, lack of diagnosis, severe illness requiring hospitalization, as well as anesthesia and sedation related illnesses.  The patient's history has been reviewed, patient examined, no change in status, and deemed stable for procedure.  The patient and/or family is agreeable to proceed.    Corliss ParishGabriel Mansouraty, MD [facility] Advanced Endoscopy Office # [phone]

## 2022-07-16 NOTE — Transfer of Care (Signed)
Immediate Anesthesia Transfer of Care Note  Patient: KHYLIN KITTLER  Procedure(s) Performed: UPPER ENDOSCOPIC ULTRASOUND (EUS) RADIAL ENDOSCOPIC MUCOSAL RESECTION ESOPHAGOGASTRODUODENOSCOPY (EGD) WITH PROPOFOL SUBMUCOSAL LIFTING INJECTION HEMOSTASIS CLIP PLACEMENT BIOPSY  Patient Location: PACU  Anesthesia Type:MAC  Level of Consciousness: sedated  Airway & Oxygen Therapy: Patient Spontanous Breathing and Patient connected to face mask oxygen  Post-op Assessment: Report given to RN and Post -op Vital signs reviewed and stable  Post vital signs: Reviewed and stable  Last Vitals:  Vitals Value Taken Time  BP    Temp    Pulse    Resp    SpO2      Last Pain:  Vitals:   07/16/22 0808  TempSrc: Tympanic  PainSc: 0-No pain         Complications: No notable events documented.

## 2022-07-16 NOTE — Op Note (Signed)
[facility] Patient Name: Austin Woods Procedure Date: 07/16/2022 MRN: [id number] Attending MD: Corliss Parish , MD, [id number] Date of Birth: Feb 08, 1966 CSN: [id number] Age: 57 Admit Type: Outpatient Procedure:                Upper EUS Indications:              Gastric deformity on endoscopy/Subepithelial tumor                            versus extrinsic compression, Neuroendocrine Tumor Providers:                Corliss Parish, MD, Zoe Lan, RN, Harrington Challenger, Technician, Albertina Senegal. Alday CRNA, CRNA Referring MD:             Katrinka Blazing Medicines:                Monitored Anesthesia Care Complications:            No immediate complications. Estimated Blood Loss:     Estimated blood loss was minimal. Procedure:                Pre-Anesthesia Assessment:                           - Prior to the procedure, a History and Physical                            was performed, and patient medications and                            allergies were reviewed. The patient's tolerance of                            previous anesthesia was also reviewed. The risks                            and benefits of the procedure and the sedation                            options and risks were discussed with the patient.                            All questions were answered, and informed consent                            was obtained. Prior Anticoagulants: The patient has                            taken Xarelto (rivaroxaban), last dose was 2 days                            prior to procedure. ASA Grade Assessment: III - A  patient with severe systemic disease. After                            reviewing the risks and benefits, the patient was                            deemed in satisfactory condition to undergo the                            procedure.                           After obtaining informed consent, the  endoscope was                            passed under direct vision. Throughout the                            procedure, the patient's blood pressure, pulse, and                            oxygen saturations were monitored continuously. The                            [id number]-[id number] ([id number]) Olympus therapeutic endoscope                            was introduced through the mouth, and advanced to                            the second part of duodenum. The [id number]-[id number]                            ([id number]) Olympus radial ultrasound scope was                            introduced through the mouth, and advanced to the                            duodenum for ultrasound examination from the                            stomach and duodenum. The upper EUS was                            accomplished without difficulty. The patient                            tolerated the procedure. Scope In: Scope Out: Findings:      ENDOSCOPIC FINDING: :      No gross lesions were noted in the entire esophagus.      The Z-line was irregular and was found 40 cm from the incisors.      Evidence of a fundoplication was found in the cardia. The wrap appeared  loose. This was traversed.      Three 5 to 7 mm submucosal papules (nodules) with no bleeding and no       stigmata of recent bleeding were found on the anterior wall of the       gastric body (these were found between 50 and 52 cm from the incisors).       After the EUS was completed, preparations were made for attempt at       mucosal resection. Demarcation of the lesions was performed with       high-definition white light and narrow band imaging to clearly identify       the boundaries of the lesion. EverLift was injected to raise each       lesion. Band ligator and snare mucosal resection was performed of each       of the lesions. Resection and retrieval were complete. Resected tissue       margins were examined and clear of polyp tissue. To prevent  bleeding       post-intervention, 2 hemostatic clips were placed at each resection site       (six hemostatic clips in total (MR conditional)). Clip manufacturer:       [employer]. There was no bleeding during and at the end of the       procedure.      Diffuse moderate inflammation characterized by erosions and friability       was found in the gastric antrum.      No other gross lesions were noted in the entire examined stomach.       Biopsies were taken with a cold forceps for histology purposes to       evaluate and ensure no evidence of atrophic gastritis.      No gross lesions were noted in the duodenal bulb, in the first portion       of the duodenum and in the second portion of the duodenum.      ENDOSONOGRAPHIC FINDING: :      Endosonographic imaging in the entire stomach showed no mass or wall       thickening.      No malignant-appearing lymph nodes were visualized in the paracardial       region (level 16), left gastric region (level 17), gastrohepatic       ligament (level 18) and celiac region (level 20).      The celiac region was visualized. Impression:               EGD impression:                           - No gross lesions in the entire esophagus. Z-line                            irregular, 40 cm from the incisors.                           - A fundoplication was found. The wrap appears                            loose.                           - Three submucosal papules (  nodules) found in the                            anterior portion of the body of the stomach.                            Complete removal was accomplished of each of the                            nodules using lift/band EMR. Clips (MR conditional)                            were placed.                           - Antral gastropathy and gastritis. No other gross                            lesions in the entire stomach. Biopsied to evaluate                            for atrophic  gastritis.                           - No gross lesions in the duodenal bulb, in the                            first portion of the duodenum and in the second                            portion of the duodenum.                           EUS impression:                           - Evaluation of the gastric lining did not show                            overt evidence of significant                            subepithelial/submucosal lesions even at the areas                            of nodularity on close EUS evaluation.                           - No malignant-appearing lymph nodes were                            visualized in the paracardial region (level 16),                            left gastric region (level 17), gastrohepatic  ligament (level 18) and celiac region (level 20). Moderate Sedation:      Not Applicable - Patient had care per Anesthesia. Recommendation:           - The patient will be observed post-procedure,                            until all discharge criteria are met.                           - Discharge patient to home.                           - Patient has a contact number available for                            emergencies. The signs and symptoms of potential                            delayed complications were discussed with the                            patient. Return to normal activities tomorrow.                            Written discharge instructions were provided to the                            patient.                           - Full liquid diet today. If patient is doing well                            he can advance his diet tomorrow.                           - May continue Voquenza daily.                           - Initiate Carafate 1 g twice daily for 3 weeks.                           - May restart Xarelto on 4/14 (72 hours to decrease                            risk of post interventional bleeding from  multiple                            EMR's).                           - Await path results.                           - Observe patient's clinical course.                           -  Repeat the upper endoscopic ultrasound in 1 year                            for surveillance based on pathology results.                           - The findings and recommendations were discussed                            with the patient.                           - The findings and recommendations were discussed                            with the designated responsible adult. Procedure Code(s):        --- Professional ---                           [phone], Esophagogastroduodenoscopy, flexible,                            transoral; with endoscopic mucosal resection                           43237, Esophagogastroduodenoscopy, flexible,                            transoral; with endoscopic ultrasound examination                            limited to the esophagus, stomach or duodenum, and                            adjacent structures                           43239, 59, Esophagogastroduodenoscopy, flexible,                            transoral; with biopsy, single or multiple Diagnosis Code(s):        --- Professional ---                           K22.89, Other specified disease of esophagus                           Z98.890, Other specified postprocedural states                           K29.70, Gastritis, unspecified, without bleeding                           K31.89, Other diseases of stomach and duodenum                           I89.9, Noninfective disorder of lymphatic  vessels                            and lymph nodes, unspecified CPT copyright 2022 American Medical Association. All rights reserved. The codes documented in this report are preliminary and upon coder review may  be revised to meet current compliance requirements. Corliss Parish, MD 07/16/2022 10:30:38 AM Number of Addenda: 0

## 2022-07-16 NOTE — Anesthesia Preprocedure Evaluation (Addendum)
Anesthesia Evaluation  Patient identified by MRN, date of birth, ID band Patient awake    Reviewed: Allergy & Precautions, NPO status , Patient's Chart, lab work & pertinent test results, reviewed documented beta blocker date and time   Airway Mallampati: II  TM Distance: >3 FB Neck ROM: Full    Dental  (+) Teeth Intact, Dental Advisory Given   Pulmonary asthma , sleep apnea , former smoker   Pulmonary exam normal breath sounds clear to auscultation       Cardiovascular hypertension, Pt. on home beta blockers and Pt. on medications + CAD, +CHF and + DVT  Normal cardiovascular exam+ dysrhythmias Atrial Fibrillation  Rhythm:Regular Rate:Normal     Neuro/Psych  Headaches    GI/Hepatic Neg liver ROS,GERD  ,,gastric neuroendocrine tumor   Endo/Other  diabetes, Type 2, Oral Hypoglycemic Agents  Morbid obesity  Renal/GU negative Renal ROS     Musculoskeletal  (+) Arthritis ,    Abdominal   Peds  Hematology  (+) Blood dyscrasia (Xarelto)   Anesthesia Other Findings Day of surgery medications reviewed with the patient.  Reproductive/Obstetrics                             Anesthesia Physical Anesthesia Plan  ASA: 3  Anesthesia Plan: MAC   Post-op Pain Management:    Induction: Intravenous  PONV Risk Score and Plan: 1 and TIVA and Treatment may vary due to age or medical condition  Airway Management Planned: Natural Airway and Simple Face Mask  Additional Equipment:   Intra-op Plan:   Post-operative Plan:   Informed Consent: I have reviewed the patients History and Physical, chart, labs and discussed the procedure including the risks, benefits and alternatives for the proposed anesthesia with the patient or authorized representative who has indicated his/her understanding and acceptance.     Dental advisory given  Plan Discussed with: CRNA and Anesthesiologist  Anesthesia Plan  Comments:        Anesthesia Quick Evaluation

## 2022-07-17 NOTE — Anesthesia Postprocedure Evaluation (Signed)
Anesthesia Post Note  Patient: Austin Woods  Procedure(s) Performed: UPPER ENDOSCOPIC ULTRASOUND (EUS) RADIAL ENDOSCOPIC MUCOSAL RESECTION ESOPHAGOGASTRODUODENOSCOPY (EGD) WITH PROPOFOL SUBMUCOSAL LIFTING INJECTION HEMOSTASIS CLIP PLACEMENT BIOPSY     Patient location during evaluation: Endoscopy Anesthesia Type: MAC Level of consciousness: oriented, awake and alert and awake Pain management: pain level controlled Vital Signs Assessment: post-procedure vital signs reviewed and stable Respiratory status: spontaneous breathing, nonlabored ventilation, respiratory function stable and patient connected to nasal cannula oxygen Cardiovascular status: blood pressure returned to baseline and stable Postop Assessment: no headache, no backache and no apparent nausea or vomiting Anesthetic complications: no   No notable events documented.  Last Vitals:  Vitals:   07/16/22 1015 07/16/22 1025  BP: (!) 116/44 134/68  Pulse: 60 (!) 57  Resp: 17 19  Temp:    SpO2: 96% 94%    Last Pain:  Vitals:   07/16/22 1025  TempSrc:   PainSc: 0-No pain                 Collene Schlichter

## 2022-07-19 ENCOUNTER — Encounter (HOSPITAL_COMMUNITY): Payer: Self-pay | Admitting: Gastroenterology

## 2022-07-20 LAB — SURGICAL PATHOLOGY

## 2022-07-24 ENCOUNTER — Encounter: Payer: Self-pay | Admitting: Gastroenterology

## 2022-07-24 DIAGNOSIS — E119 Type 2 diabetes mellitus without complications: Secondary | ICD-10-CM | POA: Diagnosis not present

## 2022-07-24 DIAGNOSIS — E559 Vitamin D deficiency, unspecified: Secondary | ICD-10-CM | POA: Diagnosis not present

## 2022-07-24 DIAGNOSIS — R7989 Other specified abnormal findings of blood chemistry: Secondary | ICD-10-CM | POA: Diagnosis not present

## 2022-07-24 DIAGNOSIS — K219 Gastro-esophageal reflux disease without esophagitis: Secondary | ICD-10-CM | POA: Diagnosis not present

## 2022-07-27 ENCOUNTER — Other Ambulatory Visit: Payer: Self-pay

## 2022-07-27 DIAGNOSIS — D490 Neoplasm of unspecified behavior of digestive system: Secondary | ICD-10-CM

## 2022-07-27 DIAGNOSIS — R140 Abdominal distension (gaseous): Secondary | ICD-10-CM

## 2022-07-27 DIAGNOSIS — D3A092 Benign carcinoid tumor of the stomach: Secondary | ICD-10-CM

## 2022-07-27 DIAGNOSIS — R1010 Upper abdominal pain, unspecified: Secondary | ICD-10-CM

## 2022-07-27 DIAGNOSIS — R110 Nausea: Secondary | ICD-10-CM

## 2022-07-28 DIAGNOSIS — K219 Gastro-esophageal reflux disease without esophagitis: Secondary | ICD-10-CM | POA: Diagnosis not present

## 2022-07-28 DIAGNOSIS — Z23 Encounter for immunization: Secondary | ICD-10-CM | POA: Diagnosis not present

## 2022-07-28 DIAGNOSIS — J45909 Unspecified asthma, uncomplicated: Secondary | ICD-10-CM | POA: Diagnosis not present

## 2022-07-28 DIAGNOSIS — Z0001 Encounter for general adult medical examination with abnormal findings: Secondary | ICD-10-CM | POA: Diagnosis not present

## 2022-07-28 DIAGNOSIS — Z862 Personal history of diseases of the blood and blood-forming organs and certain disorders involving the immune mechanism: Secondary | ICD-10-CM | POA: Diagnosis not present

## 2022-07-29 NOTE — Progress Notes (Signed)
noted 

## 2022-08-12 ENCOUNTER — Other Ambulatory Visit: Payer: Self-pay | Admitting: Gastroenterology

## 2022-08-12 ENCOUNTER — Other Ambulatory Visit (INDEPENDENT_AMBULATORY_CARE_PROVIDER_SITE_OTHER): Payer: Self-pay | Admitting: Gastroenterology

## 2022-08-13 ENCOUNTER — Other Ambulatory Visit (HOSPITAL_COMMUNITY): Payer: Self-pay | Admitting: *Deleted

## 2022-08-13 ENCOUNTER — Other Ambulatory Visit: Payer: Self-pay | Admitting: *Deleted

## 2022-08-13 DIAGNOSIS — I4819 Other persistent atrial fibrillation: Secondary | ICD-10-CM

## 2022-08-13 MED ORDER — RIVAROXABAN 20 MG PO TABS
20.0000 mg | ORAL_TABLET | Freq: Every day | ORAL | 5 refills | Status: DC
Start: 2022-08-13 — End: 2022-08-13

## 2022-08-13 MED ORDER — RIVAROXABAN 20 MG PO TABS: 20.0000 mg | ORAL_TABLET | Freq: Every day | ORAL | 5 refills | Status: DC

## 2022-08-13 NOTE — Telephone Encounter (Signed)
Prescription refill request for Xarelto received.  Indication: AF Last office visit: 04/24/22  Lenord Carbo MD Weight: 113.7kg Age: 57 Scr: 1.44 on 04/09/22  Epic CrCl: 88.83  Based on above findings Xarelto 20mg  daily is the appropriate dose.  Refill approved.

## 2022-08-13 NOTE — Telephone Encounter (Signed)
Last seen 06/22/22

## 2022-08-26 ENCOUNTER — Telehealth (INDEPENDENT_AMBULATORY_CARE_PROVIDER_SITE_OTHER): Payer: Self-pay | Admitting: *Deleted

## 2022-08-26 ENCOUNTER — Other Ambulatory Visit (INDEPENDENT_AMBULATORY_CARE_PROVIDER_SITE_OTHER): Payer: Self-pay | Admitting: *Deleted

## 2022-08-26 DIAGNOSIS — D490 Neoplasm of unspecified behavior of digestive system: Secondary | ICD-10-CM

## 2022-08-26 DIAGNOSIS — D3A8 Other benign neuroendocrine tumors: Secondary | ICD-10-CM

## 2022-08-26 DIAGNOSIS — D3A092 Benign carcinoid tumor of the stomach: Secondary | ICD-10-CM

## 2022-08-26 DIAGNOSIS — R5383 Other fatigue: Secondary | ICD-10-CM | POA: Diagnosis not present

## 2022-08-26 NOTE — Telephone Encounter (Signed)
Noted thank you

## 2022-08-26 NOTE — Telephone Encounter (Signed)
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 Christa See on 07/16/22 pathology report.   Dr. Levon Hedger sent me the message to do follow up labs. Will send Dr. Thomas Hoff nurse Hilma Favors a message letting her know we will put in labs for pt to do at [facility].   Left message to return call with patient to discuss with him. Labs put in for [facility].

## 2022-08-26 NOTE — Progress Notes (Deleted)
  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 Christa See on 07/16/22 pathology report.   Dr. Levon Hedger sent me the message to do follow up labs. Will send Dr. Thomas Hoff nurse Hilma Favors a message letting her know we will put in labs for pt to do at [facility].   Left message to return call with patient to discuss with him. Labs put in for [facility].

## 2022-08-26 NOTE — Telephone Encounter (Signed)
Patient was notified to have bloodwork done ( gastrin and chromogranin A ) done at [facility]. He states he will do labs tomorrow.

## 2022-08-28 DIAGNOSIS — D3A8 Other benign neuroendocrine tumors: Secondary | ICD-10-CM | POA: Diagnosis not present

## 2022-08-28 DIAGNOSIS — D490 Neoplasm of unspecified behavior of digestive system: Secondary | ICD-10-CM | POA: Diagnosis not present

## 2022-08-31 LAB — CHROMOGRANIN A

## 2022-09-02 LAB — GASTRIN: Gastrin: 880 pg/mL — ABNORMAL HIGH (ref 0–115)

## 2022-09-08 NOTE — Telephone Encounter (Signed)
Patient called wanting to get the results from the recent lab work.

## 2022-09-09 NOTE — Telephone Encounter (Signed)
Left message on machine to call back  

## 2022-09-09 NOTE — Telephone Encounter (Signed)
Dr Meridee Score see the lab results in My Chart for the chromogranin A and Gastrin level.

## 2022-09-09 NOTE — Telephone Encounter (Signed)
The pt has been advised that the lab was not ordered by our doctor and he will need to call that office for results.  The pt has been advised of the information and verbalized understanding.

## 2022-09-09 NOTE — Telephone Encounter (Signed)
patient thanks Patty.  Thanks Patty. I think the elevated chromogranin level and now seeing the gastrin level. Plan had been for dotatate scan to be ordered. I am putting Dr. Levon Hedger and NP Jeanmarie Hubert on here. In the setting of his neuroendocrine tumor, makes sense for dotatate/Netspot scan to be performed next. I think they were going to order it, but if they have any issues we can certainly order it. 86-month EUS should be in the books or getting scheduled. Imaging study needs to be completed first. Thanks. GM

## 2022-09-09 NOTE — Telephone Encounter (Signed)
The pt has been advised that he will be contacted by Dr Levon Hedger to be set up for dototate scan.

## 2022-09-10 ENCOUNTER — Other Ambulatory Visit (INDEPENDENT_AMBULATORY_CARE_PROVIDER_SITE_OTHER): Payer: Self-pay | Admitting: Gastroenterology

## 2022-09-14 ENCOUNTER — Encounter (INDEPENDENT_AMBULATORY_CARE_PROVIDER_SITE_OTHER): Payer: Self-pay

## 2022-09-14 NOTE — Telephone Encounter (Signed)
Please see message below

## 2022-09-14 NOTE — Telephone Encounter (Signed)
Thanks for the message. He may be a candidate for redo Nissen fundoplication depending on the scan findings.  Hi Tanya, can you please schedule a PET Dotate scan? Dx: Gastric carcinoid.   He has also complained of recurrent hernia in his abdominal wall. Hi Ann, can you please refer the patient to general surgery at [facility]?  Diagnosis recurrent abdominal hernia  Thanks,   Katrinka Blazing, MD Gastroenterology and Hepatology [facility] Gastroenterology

## 2022-09-15 ENCOUNTER — Telehealth: Payer: Self-pay

## 2022-09-15 ENCOUNTER — Other Ambulatory Visit: Payer: Self-pay

## 2022-09-15 DIAGNOSIS — D490 Neoplasm of unspecified behavior of digestive system: Secondary | ICD-10-CM

## 2022-09-15 DIAGNOSIS — D3A8 Other benign neuroendocrine tumors: Secondary | ICD-10-CM

## 2022-09-15 NOTE — Telephone Encounter (Signed)
Referral placed in Epic, they will contact patient 

## 2022-09-15 NOTE — Telephone Encounter (Signed)
-----   Message from Lemar Lofty., MD sent at 09/15/2022  5:18 AM EDT ----- DCM, Thanks. Will see the dotatate scan. Will get him scheduled for 88-month follow-up EUS, pending what the results of the dotatate scan end up showing in case we have to change that date.  Koula Venier, Please get this patient on for our August/September dates for upper EUS.  Thanks. PM ----- Message ----- From: Dolores Frame, MD Sent: 09/14/2022  11:17 PM EDT To: Loretha Stapler, RN; Raquel Silvester, NP; #  Thanks Vicente Serene. Discussed with the patient the lab findings.  He is still having significant heartburn and will not be able to stop his medication.  Will proceed with Dotatate scan.  Will keep you updated about the findings. Thanks ----- Message ----- From: Lemar Lofty., MD Sent: 09/04/2022   5:45 AM EDT To: Loretha Stapler, RN; Raquel Jamahl, NP; #  CLC, With elevation, it certainly makes sense for [place] to go ahead and move forward with the dotatate scan.  I think the patient being on Voquneza is probably the reason why we are seeing these numbers still stay elevated, but I am not sure based on the symptoms he feels with whether he would be able to stop his medication for 2 weeks and then redo the chromogranin A level.  That is 1 way to approach this otherwise the second approach would be to go ahead and just order the dotatate scan. I am fine either way, you all know him better in regards to his GI symptoms.  Unless the chromogranin A is in the normal range after stopping Voquenza (if you choose that option) he will still need a dotatate scan. Let me all know what you decide. I am okay with you guys ordering and forward me the results. GM ----- Message ----- From: Raquel Demetruis, NP Sent: 09/02/2022   1:56 PM EDT To: Loretha Stapler, RN; Lemar Lofty., MD  Dr. Meridee Score,  Not sure if you all were following his repeat labs or not since we ordered but looking back at  your recommendations, appears you planned Netspot/Dotatate If chromagranin/gastrin levels more elevated on repeat which they are, Would you prefer to order this or would you like for me to?   Thanks!!  Chelsea L. Jeanmarie Hubert, MSN, APRN, AGNP-C Adult-Gerontology Nurse Practitioner [facility] Gastroenterology at [facility]  ----- Message ----- From: Interface, [facility] Lab Results In Sent: 08/31/2022   7:35 AM EDT To: Dolores Frame, MD

## 2022-09-15 NOTE — Addendum Note (Signed)
Addended by: Marlowe Shores on: 09/15/2022 04:47 PM   Modules accepted: Orders

## 2022-09-15 NOTE — Telephone Encounter (Signed)
EUS has been scheduled for 12/22/22 at 9 am at [facility] with GM    Left message on machine to call back    faxed request for Xarelto to Mallipeddi, Vishnu

## 2022-09-15 NOTE — Telephone Encounter (Signed)
Mallipeddi, Vishnu P, MD  Loretha Stapler, RN Hello, Xarelto can be held 3 days prior to the procedure and can safely resume after the procedure if no active bleeding. No Hx of CVA in the past, so does not need bridging.

## 2022-09-16 ENCOUNTER — Encounter (INDEPENDENT_AMBULATORY_CARE_PROVIDER_SITE_OTHER): Payer: Self-pay

## 2022-09-16 NOTE — Telephone Encounter (Signed)
EUS scheduled, pt instructed and medications reviewed.  Patient instructions mailed to home and sent to My Chart.  Patient to call with any questions or concerns.  He has been advised of the xarelto hold.

## 2022-09-16 NOTE — Telephone Encounter (Signed)
PET Dotate ordered and approved through insurance. Approval is valid from 09/15/22-10/14/22 Contacted [facility] but had to leave voicemail. Left detailed voicemail with pt name/dob/MRN/phone number.   Will send pt a my chart message letting him know that he may be getting a call from [place] [facility].

## 2022-09-17 NOTE — Telephone Encounter (Signed)
PET scan scheduled for 10/12/22 at 9:20am at [facility]

## 2022-09-23 ENCOUNTER — Other Ambulatory Visit (INDEPENDENT_AMBULATORY_CARE_PROVIDER_SITE_OTHER): Payer: Self-pay | Admitting: Gastroenterology

## 2022-09-23 NOTE — Telephone Encounter (Signed)
Last seen 06/22/22

## 2022-10-06 ENCOUNTER — Ambulatory Visit (INDEPENDENT_AMBULATORY_CARE_PROVIDER_SITE_OTHER): Payer: BC Managed Care – PPO | Admitting: Gastroenterology

## 2022-10-12 ENCOUNTER — Encounter (HOSPITAL_COMMUNITY)
Admission: RE | Admit: 2022-10-12 | Discharge: 2022-10-12 | Disposition: A | Discharge status: Home / Self Care | Payer: BC Managed Care – PPO | Source: Ambulatory Visit | Attending: Gastroenterology | Admitting: Gastroenterology

## 2022-10-12 DIAGNOSIS — D490 Neoplasm of unspecified behavior of digestive system: Secondary | ICD-10-CM | POA: Insufficient documentation

## 2022-10-12 DIAGNOSIS — R911 Solitary pulmonary nodule: Secondary | ICD-10-CM | POA: Diagnosis not present

## 2022-10-12 DIAGNOSIS — D3A092 Benign carcinoid tumor of the stomach: Secondary | ICD-10-CM | POA: Diagnosis not present

## 2022-10-12 MED ORDER — COPPER CU 64 DOTATATE 1 MCI/ML IV SOLN
4.0000 | Freq: Once | INTRAVENOUS | Status: AC
  Administered 2022-10-12: 4.013 via INTRAVENOUS

## 2022-10-15 ENCOUNTER — Ambulatory Visit (INDEPENDENT_AMBULATORY_CARE_PROVIDER_SITE_OTHER): Payer: BC Managed Care – PPO | Admitting: General Surgery

## 2022-10-15 ENCOUNTER — Encounter: Payer: Self-pay | Admitting: General Surgery

## 2022-10-15 VITALS — BP 117/73 | HR 64 | Temp 97.7°F | Resp 16 | Ht 73.0 in | Wt 241.0 lb

## 2022-10-15 DIAGNOSIS — R1031 Right lower quadrant pain: Secondary | ICD-10-CM | POA: Diagnosis not present

## 2022-10-15 NOTE — Progress Notes (Signed)
Austin Woods; [id number]; February 05, 1966   HPI Patient is a 57 year old white male who was referred to my care by Dr. Levon Hedger for evaluation and treatment of right groin pain.  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  He is waiting the results of the recent PET scan.  He states he has had 2 right inguinal hernia repairs done at another facility and is experiencing intermittent episodes of right groin pain.  It does not necessarily extend down his right inner thigh or scrotum.  It can be made worse with straining.  He has been placed on Mobic intermittently to help with the pain.  He has not noticed a bulge. Past Medical History:  Diagnosis Date   A-fib (HCC)    Anemia    iron def anemia   Arthritis    Asthma    Coronary artery disease    Dysrhythmia    in and out Afib   GERD (gastroesophageal reflux disease)    Headache    High cholesterol    History of DVT (deep vein thrombosis)    a. left leg following ablation for SVT   Hypertension    Persistent atrial fibrillation (HCC)    a. Dx 08/2012, xarelto initiated; b. 09/2012 Tikosyn initiated -> DCCV -> Sinus c. PVI 03/2013 d. PVI 03/2014   Sleep apnea    USES  CPAP    Type 2 diabetes mellitus without complication, without long-term current use of insulin (HCC) 02/16/2018   Wolff-Parkinson-White (WPW) syndrome    resolved s/p RFCA 2001 by Dr Graciela Husbands    Past Surgical History:  Procedure Laterality Date   ATRIAL FIBRILLATION ABLATION  03/23/2013   PVI by DR Johney Frame for afib   ATRIAL FIBRILLATION ABLATION N/A 03/23/2013   Procedure: ATRIAL FIBRILLATION ABLATION;  Surgeon: Gardiner Rhyme, MD;  Location: [facility] CATH LAB;  Service: Cardiovascular;  Laterality: N/A;   ATRIAL FIBRILLATION ABLATION N/A 03/27/2014   PVI Dr Allyne Gee SURGERY     BIOPSY  02/28/2020   Procedure: BIOPSY;  Surgeon: Malissa Hippo, MD;  Location: [facility] ENDO [facility];  Service: Endoscopy;;  antral(pre-pyloric)    BIOPSY  04/16/2022    Procedure: BIOPSY;  Surgeon: Dolores Frame, MD;  [facility]: [facility];  Service: Gastroenterology;;   BIOPSY  07/16/2022   Procedure: BIOPSY;  Surgeon: Lemar Lofty., MD;  Location: [facility] ENDOSCOPY;  Service: Gastroenterology;;   CARDIAC CATHETERIZATION N/A 02/03/2016   Procedure: Left Heart Cath and Coronary Angiography;  Surgeon: Yvonne Kendall, MD;  Location: [facility] INVASIVE CV LAB;  Service: Cardiovascular;  Laterality: N/A;   CARDIAC CATHETERIZATION N/A 02/03/2016   Procedure: Intravascular Pressure Wire/FFR Study;  Surgeon: Yvonne Kendall, MD;  Location: [facility] INVASIVE CV LAB;  Service: Cardiovascular;  Laterality: N/A;   CARDIAC CATHETERIZATION N/A 02/03/2016   Procedure: Coronary Stent Intervention;  Surgeon: Yvonne Kendall, MD;  Location: [facility] INVASIVE CV LAB;  Service: Cardiovascular;  Laterality: N/A;   CARDIAC SURGERY     CARDIOVERSION N/A 08/25/2012   Procedure: CARDIOVERSION;  Surgeon: Vesta Mixer, MD;  Location: [facility] ENDOSCOPY;  Service: Cardiovascular;  Laterality: N/A;   CARDIOVERSION N/A 09/06/2012   Procedure: CARDIOVERSION/Bedside;  Surgeon: Luis Abed, MD;  Location: [facility] OR;  Service: Cardiovascular;  Laterality: N/A;   CARDIOVERSION N/A 01/12/2014   Procedure: CARDIOVERSION;  Surgeon: Chrystie Nose, MD;  Location: [facility] ENDOSCOPY;  Service: Cardiovascular;  Laterality: N/A;   CARDIOVERSION N/A 01/30/2016   Procedure: CARDIOVERSION;  Surgeon: Quintella Reichert, MD;  Location: [facility] ENDOSCOPY;  Service: Cardiovascular;  Laterality: N/A;   CARDIOVERSION N/A 04/14/2016   Procedure: CARDIOVERSION;  Surgeon: Chrystie Nose, MD;  Location: [facility] ENDOSCOPY;  Service: Cardiovascular;  Laterality: N/A;   COLONOSCOPY N/A 12/04/2015   Procedure: COLONOSCOPY;  Surgeon: Malissa Hippo, MD;  [facility];  Service: Endoscopy;  Laterality: N/A;  730   COLONOSCOPY WITH PROPOFOL N/A 04/28/2019   Procedure: COLONOSCOPY WITH PROPOFOL;  Surgeon: Malissa Hippo, MD;   [facility];  Service: Endoscopy;  Laterality: N/A;   CORONARY ANGIOPLASTY     CORONARY ANGIOPLASTY WITH STENT PLACEMENT     ELECTROPHYSIOLOGIC STUDY N/A 02/18/2016   Procedure: Atrial Fibrillation Ablation;  Surgeon: Hillis Range, MD;  Location: [facility] INVASIVE CV LAB;  Service: Cardiovascular;  Laterality: N/A;   ENDOSCOPIC MUCOSAL RESECTION N/A 07/16/2022   Procedure: ENDOSCOPIC MUCOSAL RESECTION;  Surgeon: Meridee Score Netty Starring., MD;  Location: [facility] ENDOSCOPY;  Service: Gastroenterology;  Laterality: N/A;   ESOPHAGOGASTRODUODENOSCOPY (EGD) WITH PROPOFOL N/A 04/28/2019   Procedure: ESOPHAGOGASTRODUODENOSCOPY (EGD) WITH PROPOFOL;  Surgeon: Malissa Hippo, MD;  [facility];  Service: Endoscopy;  Laterality: N/A;  7:30   ESOPHAGOGASTRODUODENOSCOPY (EGD) WITH PROPOFOL N/A 02/28/2020   Procedure: ESOPHAGOGASTRODUODENOSCOPY (EGD) WITH PROPOFOL;  Surgeon: Malissa Hippo, MD;  [facility];  Service: Endoscopy;  Laterality: N/A;  1245   ESOPHAGOGASTRODUODENOSCOPY (EGD) WITH PROPOFOL N/A 04/16/2022   Procedure: ESOPHAGOGASTRODUODENOSCOPY (EGD) WITH PROPOFOL;  Surgeon: Dolores Frame, MD;  [facility];  Service: Gastroenterology;  Laterality: N/A;  730am, asa 1-2   ESOPHAGOGASTRODUODENOSCOPY (EGD) WITH PROPOFOL N/A 07/16/2022   Procedure: ESOPHAGOGASTRODUODENOSCOPY (EGD) WITH PROPOFOL;  Surgeon: Meridee Score Netty Starring., MD;  Location: [facility] ENDOSCOPY;  Service: Gastroenterology;  Laterality: N/A;   EUS N/A 07/16/2022   Procedure: UPPER ENDOSCOPIC ULTRASOUND (EUS) RADIAL;  Surgeon: Lemar Lofty., MD;  Location: [facility] ENDOSCOPY;  Service: Gastroenterology;  Laterality: N/A;   HEMOSTASIS CLIP PLACEMENT  07/16/2022   Procedure: HEMOSTASIS CLIP PLACEMENT;  Surgeon: Lemar Lofty., MD;  Location: [facility] ENDOSCOPY;  Service: Gastroenterology;;   implantable loop recorder removal  03/01/2019   MDT Reveal LINQ removed in office   INGUINAL HERNIA REPAIR  Bilateral 06/04/2017   Procedure: OPEN BILATERAL INGUINAL HERNIA REPAIR;  Surgeon: Jimmye Norman, MD;  Location: [facility] OR;  Service: General;  Laterality: Bilateral;   LOOP RECORDER IMPLANT N/A 06/29/2014   Procedure: LOOP RECORDER IMPLANT;  Surgeon: Hillis Range, MD;  Location: [facility] CATH LAB;  Service: Cardiovascular;  Laterality: N/A;   NOSE SURGERY     sleep apnea surgery   PERIPHERAL VASCULAR CATHETERIZATION N/A 03/13/2016   Procedure: Thrombin Injection;  Surgeon: Nada Libman, MD;  Location: [facility] INVASIVE CV LAB;  Service: Cardiovascular;  Laterality: N/A;   POLYPECTOMY  04/28/2019   Procedure: POLYPECTOMY;  Surgeon: Malissa Hippo, MD;  [facility];  Service: Endoscopy;;  duodenum;splenic flexure;   STOMACH SURGERY     reflux, fundoplication   SUBMUCOSAL LIFTING INJECTION  07/16/2022   Procedure: SUBMUCOSAL LIFTING INJECTION;  Surgeon: Lemar Lofty., MD;  Location: [facility] ENDOSCOPY;  Service: Gastroenterology;;   TEE WITHOUT CARDIOVERSION N/A 08/25/2012   Procedure: TRANSESOPHAGEAL ECHOCARDIOGRAM (TEE);  Surgeon: Vesta Mixer, MD;  Location: [facility] ENDOSCOPY;  Service: Cardiovascular;  Laterality: N/A;   TEE WITHOUT CARDIOVERSION  03/23/2013   DR ROSS   TEE WITHOUT CARDIOVERSION N/A 03/23/2013   Procedure: TRANSESOPHAGEAL ECHOCARDIOGRAM (TEE);  Surgeon: Pricilla Riffle, MD;  Location: [facility] ENDOSCOPY;  Service: Cardiovascular;  Laterality: N/A;   TEE WITHOUT CARDIOVERSION N/A 03/26/2014   Procedure: TRANSESOPHAGEAL ECHOCARDIOGRAM (TEE);  Surgeon: Wendall Stade, MD;  Location: [facility] ENDOSCOPY;  Service: Cardiovascular;  Laterality: N/A;   TONSILLECTOMY      Family History  Problem Relation Age of Onset   CAD Father 34   Diabetes Father    Heart attack Father    Transient ischemic attack Father    Vascular Disease Father    Diabetes Mother    Anemia Mother    Gout Mother    Aneurysm Maternal Grandmother    Stroke Maternal Grandfather    Diabetes Paternal Grandmother     Stroke Paternal Grandfather    Diabetes Brother     Current Outpatient Medications on File Prior to Visit  Medication Sig Dispense Refill   albuterol (VENTOLIN HFA) 108 (90 Base) MCG/ACT inhaler Inhale 2 puffs into the lungs every 6 (six) hours as needed for wheezing or shortness of breath. 8 g 0   atorvastatin (LIPITOR) 40 MG tablet Take 1 tablet (40 mg total) by mouth daily. 90 tablet 3   azelastine (ASTELIN) 0.1 % nasal spray Place 1 spray into both nostrils 2 (two) times daily as needed for rhinitis or allergies.     blood glucose meter kit and supplies Dispense based on patient and insurance preference. Use to check blood sugar once a day(FOR ICD-10 E10.9, E11.9). 1 each 0   Blood Glucose Monitoring Suppl (CONTOUR NEXT MONITOR) w/Device KIT as directed per package instructions twice a day     Cholecalciferol (VITAMIN D) 2000 UNITS CAPS Take 4,000 Units by mouth daily.      dofetilide (TIKOSYN) 500 MCG capsule TAKE 1 CAPSULE BY MOUTH TWICE  DAILY 180 capsule 1   enalapril (VASOTEC) 10 MG tablet Take 1 tablet (10 mg total) by mouth 2 (two) times daily. 180 tablet 3   fluticasone (FLONASE) 50 MCG/ACT nasal spray Place 1 spray into both nostrils daily as needed for allergies.      Fluticasone-Salmeterol (ADVAIR DISKUS) 250-50 MCG/DOSE AEPB Inhale 1 puff into the lungs every 12 (twelve) hours. 60 each 0   furosemide (LASIX) 40 MG tablet Take 1 tablet (40 mg total) by mouth as needed.     glucose blood (CONTOUR NEXT TEST) test strip as directed per package instructions In Vitro twice a day     hydrOXYzine (VISTARIL) 25 MG capsule Take 2 capsules (50 mg total) by mouth 3 (three) times daily as needed for anxiety. 30 capsule 0   loratadine (CLARITIN) 10 MG tablet Take 10 mg by mouth daily as needed for allergies.      Magnesium Oxide 400 MG CAPS Take one tablet by mouth twice daily (Patient taking differently: Take one tablet by mouth daily) 60 capsule 9   meloxicam (MOBIC) 15 MG tablet Take 15 mg  by mouth daily as needed for pain.     metFORMIN (GLUCOPHAGE) 1000 MG tablet TAKE 1 TABLET BY MOUTH TWICE  DAILY WITH MEALS 30 tablet 0   metoprolol tartrate (LOPRESSOR) 50 MG tablet Take 50 mg by mouth daily.     nitroGLYCERIN (NITROSTAT) 0.4 MG SL tablet DISSOLVE 1 TABLET UNDER THE TONGUE EVERY 5 MINUTES AS  NEEDED FOR CHEST PAIN. MAX  OF 3 TABLETS IN 15 MINUTES. CALL 911 IF PAIN PERSISTS. 25 tablet 1   ondansetron (ZOFRAN) 4 MG tablet TAKE 1 TABLET BY MOUTH EVERY 8 HOURS AS NEEDED FOR NAUSEA AND VOMITING 30 tablet  0   oxybutynin (DITROPAN) 5 MG tablet TAKE ONE-HALF TABLET (2.5mg ) BY  MOUTH TWO TIMES DAILY AS  NEEDED FOR BLADDER SPASMS 30 tablet 5   potassium chloride SA (KLOR-CON) 20 MEQ tablet TAKE 1 TABLET BY MOUTH  DAILY 90 tablet 3   rivaroxaban (XARELTO) 20 MG TABS tablet Take 1 tablet (20 mg total) by mouth daily with supper. 30 tablet 5   sucralfate (CARAFATE) 1 GM/10ML suspension Take 10 mLs (1 g total) by mouth 2 (two) times daily for 21 days. 420 mL 0   vitamin B-12 (CYANOCOBALAMIN) 500 MCG tablet Take 500 mcg by mouth daily.     Vonoprazan Fumarate (VOQUEZNA) 10 MG TABS Take 10 mg by mouth daily at 6 (six) AM. 30 tablet 5   Simethicone 80 MG TABS Take 1 tablet (80 mg total) by mouth every 6 (six) hours as needed. (Patient not taking: Reported on 07/15/2022) 60 tablet 1   No current facility-administered medications on file prior to visit.    No Known Allergies  Social History   Substance and Sexual Activity  Alcohol Use No   Alcohol/week: 0.0 standard drinks of alcohol    Social History   Tobacco Use  Smoking Status Former   Current packs/day: 0.00   Average packs/day: 1 pack/day for 7.2 years (7.2 ttl pk-yrs)   Types: Cigarettes   Start date: 05/19/2003   Quit date: 08/06/2010   Years since quitting: 12.2  Smokeless Tobacco Never    Review of Systems  Constitutional: Negative.   HENT:  Positive for sinus pain.   Eyes: Negative.   Respiratory:  Positive for  shortness of breath.   Cardiovascular: Negative.   Gastrointestinal:  Positive for abdominal pain and nausea.  Genitourinary: Negative.   Musculoskeletal:  Positive for back pain.  Skin: Negative.   Neurological:  Positive for sensory change.  Endo/Heme/Allergies: Negative.   Psychiatric/Behavioral: Negative.      Objective   Vitals:   10/15/22 0925  BP: 117/73  Pulse: 64  Resp: 16  Temp: 97.7 F (36.5 C)  SpO2: 96%    Physical Exam Vitals reviewed.  Constitutional:      Appearance: Normal appearance. He is not ill-appearing.  HENT:     Head: Normocephalic and atraumatic.  Cardiovascular:     Rate and Rhythm: Normal rate and regular rhythm.     Heart sounds: Normal heart sounds. No murmur heard.    No friction rub. No gallop.  Pulmonary:     Effort: Pulmonary effort is normal. No respiratory distress.     Breath sounds: Normal breath sounds. No stridor. No wheezing, rhonchi or rales.  Abdominal:     General: Bowel sounds are normal. There is no distension.     Palpations: Abdomen is soft. There is no mass.     Tenderness: There is abdominal tenderness. There is no guarding or rebound.     Hernia: No hernia is present.     Comments: Some point tenderness noted over the right internal ring.  I do not feel a discrete hernia.  Genitourinary:    Testes: Normal.  Skin:    General: Skin is warm and dry.  Neurological:     Mental Status: He is alert and oriented to person, place, and time.   Reports on epic reviewed CT scan of the abdomen done earlier this year was reviewed and no discrete hernia could be seen.  He may have some adipose tissue along the spermatic cord bilaterally.  Assessment  Right  groin pain, status post right inguinal herniorrhaphy x 2.  No discrete recurrent hernias noted.  This may be muscular strain. Plan  At this point, I do not recommend reexploration of the right groin region.  He does not have a discrete hernia defect to be addressed.  Should  he continue to have pain, neurolysis may be indicated.  Follow-up here as needed.

## 2022-10-17 DIAGNOSIS — J329 Chronic sinusitis, unspecified: Secondary | ICD-10-CM | POA: Diagnosis not present

## 2022-10-17 DIAGNOSIS — Z20828 Contact with and (suspected) exposure to other viral communicable diseases: Secondary | ICD-10-CM | POA: Diagnosis not present

## 2022-10-17 DIAGNOSIS — Z6832 Body mass index (BMI) 32.0-32.9, adult: Secondary | ICD-10-CM | POA: Diagnosis not present

## 2022-10-17 DIAGNOSIS — J40 Bronchitis, not specified as acute or chronic: Secondary | ICD-10-CM | POA: Diagnosis not present

## 2022-10-19 ENCOUNTER — Other Ambulatory Visit (INDEPENDENT_AMBULATORY_CARE_PROVIDER_SITE_OTHER): Payer: Self-pay | Admitting: *Deleted

## 2022-10-19 ENCOUNTER — Telehealth (INDEPENDENT_AMBULATORY_CARE_PROVIDER_SITE_OTHER): Payer: Self-pay

## 2022-10-19 NOTE — Telephone Encounter (Signed)
Please let him know that I will reach to him once I've discussed the imaging results with dr. Meridee Score Thanks

## 2022-10-19 NOTE — Telephone Encounter (Signed)
Patient made aware. He asked that if we call him to call after 4 pm.

## 2022-10-19 NOTE — Telephone Encounter (Signed)
Patient calling for PET Scan results from 10/12/2022.

## 2022-10-20 ENCOUNTER — Other Ambulatory Visit (INDEPENDENT_AMBULATORY_CARE_PROVIDER_SITE_OTHER): Payer: Self-pay | Admitting: Gastroenterology

## 2022-10-22 ENCOUNTER — Other Ambulatory Visit: Payer: Self-pay

## 2022-10-22 ENCOUNTER — Telehealth: Payer: Self-pay

## 2022-10-22 DIAGNOSIS — D490 Neoplasm of unspecified behavior of digestive system: Secondary | ICD-10-CM

## 2022-10-22 NOTE — Telephone Encounter (Signed)
Discussed with the patient regarding pet scan findings.  I discussed with Dr. Meridee Score that the next step will be to perform a repeat endoscopic ultrasound/esophagogastroduodenospy in August.  Patient is aware of this.

## 2022-10-22 NOTE — Telephone Encounter (Signed)
Appt for EGD EUS has been moved to 12/03/22 at 115 pm at [facility] with GM

## 2022-10-22 NOTE — Telephone Encounter (Signed)
-----   Message from [facility] sent at 10/21/2022  5:48 PM EDT ----- DCM, Not sure what to make of it but certainly we can evaluate this at time of follow-up EUS.  Andon Villard, Please let the patient reviewed the dotatate scan ordered by Dr. Levon Hedger.  There could be some persistence of disease that we need to try to see if we can remove versus is this a result of the acid medication that he is currently on. Lets plan to move forward with scheduling EGD/EUS for August (look at August 28 or August 29). Thanks. GM ----- Message ----- From: Dolores Frame, MD Sent: 10/21/2022   4:10 PM EDT To: Lemar Lofty., MD  [facility] you are doing well. I do not member if I send you this message before but just want to double check on this.  I received the results of the most recent PET scan that showed increased activity in the proximal stomach.  What are your thoughts?  Should I proceed with a repeat EGD to see if there is any more areas suggestive of other spots of neuroendocrine tumor or would like to evaluate this again with an endoscopic ultrasound? Thanks

## 2022-10-23 NOTE — Telephone Encounter (Signed)
The pt has been advised of the appt time and date change.   He also has been reminded to hold xarelto.  He will call if he has any question or concerns.

## 2022-10-26 ENCOUNTER — Encounter: Payer: Self-pay | Admitting: Cardiovascular Disease

## 2022-10-26 ENCOUNTER — Other Ambulatory Visit: Payer: Self-pay

## 2022-10-26 MED ORDER — DOFETILIDE 500 MCG PO CAPS: ORAL_CAPSULE | ORAL | 1 refills | Status: DC

## 2022-11-06 ENCOUNTER — Ambulatory Visit: Payer: BC Managed Care – PPO

## 2022-11-06 ENCOUNTER — Ambulatory Visit: Payer: BC Managed Care – PPO | Attending: Internal Medicine | Admitting: Internal Medicine

## 2022-11-06 ENCOUNTER — Encounter: Payer: Self-pay | Admitting: Internal Medicine

## 2022-11-06 ENCOUNTER — Ambulatory Visit: Payer: BC Managed Care – PPO | Admitting: Cardiovascular Disease

## 2022-11-06 VITALS — BP 118/76 | HR 58 | Ht 73.0 in | Wt 248.0 lb

## 2022-11-06 DIAGNOSIS — R42 Dizziness and giddiness: Secondary | ICD-10-CM | POA: Insufficient documentation

## 2022-11-06 DIAGNOSIS — Z01810 Encounter for preprocedural cardiovascular examination: Secondary | ICD-10-CM

## 2022-11-06 DIAGNOSIS — I4819 Other persistent atrial fibrillation: Secondary | ICD-10-CM | POA: Diagnosis not present

## 2022-11-06 NOTE — Patient Instructions (Signed)
Medication Instructions:  Your physician recommends that you continue on your current medications as directed. Please refer to the Current Medication list given to you today.  *If you need a refill on your cardiac medications before your next appointment, please call your pharmacy*   Lab Work: None If you have labs (blood work) drawn today and your tests are completely normal, you will receive your results only by: MyChart Message (if you have MyChart) OR A paper copy in the mail If you have any lab test that is abnormal or we need to change your treatment, we will call you to review the results.   Testing/Procedures: Zio Monitor-3 Days   Follow-Up: At [facility], you and your health needs are our priority.  As part of our continuing mission to provide you with exceptional heart care, we have created designated Provider Care Teams.  These Care Teams include your primary Cardiologist (physician) and Advanced Practice Providers (APPs -  Physician Assistants and Nurse Practitioners) who all work together to provide you with the care you need, when you need it.  We recommend signing up for the patient portal called "MyChart".  Sign up information is provided on this After Visit Summary.  MyChart is used to connect with patients for Virtual Visits (Telemedicine).  Patients are able to view lab/test results, encounter notes, upcoming appointments, etc.  Non-urgent messages can be sent to your provider as well.   To learn more about what you can do with MyChart, go to [email or web address].    Your next appointment:   10 month(s)  Provider:   Luane School, MD    Other Instructions Christena Deem- Long Term Monitor Instructions   Your physician has requested you wear your ZIO patch monitor___3____days.   This is a single patch monitor.  [employer] supplies one patch monitor per enrollment.  Additional stickers are not available.   Please do not apply patch if you will be having a  Nuclear Stress Test, Echocardiogram, Cardiac CT, MRI, or Chest Xray during the time frame you would be wearing the monitor. The patch cannot be worn during these tests.  You cannot remove and re-apply the ZIO XT patch monitor.   Your ZIO patch monitor will be sent USPS Priority mail from [facility] directly to your home address. The monitor may also be mailed to a PO BOX if home delivery is not available.   It may take 3-5 days to receive your monitor after you have been enrolled.   Once you have received you monitor, please review enclosed instructions.  Your monitor has already been registered assigning a specific monitor serial # to you.   Applying the monitor   Shave hair from upper left chest.   Hold abrader disc by orange tab.  Rub abrader in 40 strokes over left upper chest as indicated in your monitor instructions.   Clean area with 4 enclosed alcohol pads .  Use all pads to assure are is cleaned thoroughly.  Let dry.   Apply patch as indicated in monitor instructions.  Patch will be place under collarbone on left side of chest with arrow pointing upward.   Rub patch adhesive wings for 2 minutes.Remove white label marked "1".  Remove white label marked "2".  Rub patch adhesive wings for 2 additional minutes.   While looking in a mirror, press and release button in center of patch.  A small green light will flash 3-4 times .  This will be your only indicator the monitor  has been turned on.     Do not shower for the first 24 hours.  You may shower after the first 24 hours.   Press button if you feel a symptom. You will hear a small click.  Record Date, Time and Symptom in the Patient Log Book.   When you are ready to remove patch, follow instructions on last 2 pages of Patient Log Book.  Stick patch monitor onto last page of Patient Log Book.   Place Patient Log Book in [place] box.  Use locking tab on box and tape box closed securely.  The [employer] and [employer] has [employer] on it.  Please place in mailbox as soon as possible.  Your physician should have your test results approximately 7 days after the monitor has been mailed back to [facility].   Call [facility] Customer Care at [phone] if you have questions regarding your ZIO XT patch monitor.  Call them immediately if you see an orange light blinking on your monitor.   If your monitor falls off in less than 4 days contact our Monitor department at [phone].  If your monitor becomes loose or falls off after 4 days call [employer] at [phone] for suggestions on securing your monitor.

## 2022-11-06 NOTE — Progress Notes (Addendum)
Cardiology Office Note  Date: 11/06/2022   ID: Austin Woods, DOB Sep 23, 1965, MRN [id number]  PCP:  Dion Saucier, PA-C  Cardiologist:  Marjo Bicker, MD Electrophysiologist:  Hillis Range, MD (Inactive)    History of Present Illness: Austin Woods is a 57 y.o. male known to have CAD s/p LAD PCI in 01/2016, paroxysmal atrial fibrillation s/p multiple ablations currently on Tikosyn and AC, HTN, HLD, OSA, COPD presented to cardiology clinic for follow-up visit.  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  He is overall doing great, no symptoms of angina or DOE.  No orthopnea, PND.  He has constant dizziness and he also had a few falls.  EKG showed NSR and no conduction abnormalities.  Normal QTc interval..  Past Medical History:  Diagnosis Date   A-fib (HCC)    Anemia    iron def anemia   Arthritis    Asthma    Coronary artery disease    Dysrhythmia    in and out Afib   GERD (gastroesophageal reflux disease)    Headache    High cholesterol    History of DVT (deep vein thrombosis)    a. left leg following ablation for SVT   Hypertension    Persistent atrial fibrillation (HCC)    a. Dx 08/2012, xarelto initiated; b. 09/2012 Tikosyn initiated -> DCCV -> Sinus c. PVI 03/2013 d. PVI 03/2014   Sleep apnea    USES  CPAP    Type 2 diabetes mellitus without complication, without long-term current use of insulin (HCC) 02/16/2018   Wolff-Parkinson-White (WPW) syndrome    resolved s/p RFCA 2001 by Dr Graciela Husbands    Past Surgical History:  Procedure Laterality Date   ATRIAL FIBRILLATION ABLATION  03/23/2013   PVI by DR Johney Frame for afib   ATRIAL FIBRILLATION ABLATION N/A 03/23/2013   Procedure: ATRIAL FIBRILLATION ABLATION;  Surgeon: Gardiner Rhyme, MD;  [facility]: [facility] CATH LAB;  Service: Cardiovascular;  Laterality: N/A;   ATRIAL FIBRILLATION ABLATION N/A 03/27/2014   PVI Dr Allyne Gee SURGERY     BIOPSY  02/28/2020    Procedure: BIOPSY;  Surgeon: Malissa Hippo, MD;  [facility]: [facility] ENDO [facility];  Service: Endoscopy;;  antral(pre-pyloric)    BIOPSY  04/16/2022   Procedure: BIOPSY;  Surgeon: Dolores Frame, MD;  [facility] END[facility];  Service: Gastroenterology;;   BIOPSY  07/16/2022   Procedure: BIOPSY;  Surgeon: Lemar Lofty., MD;  Location: [facility] ENDOSCOPY;  Service: Gastroenterology;;   CARDIAC CATHETERIZATION N/A 02/03/2016   Procedure: Left Heart Cath and Coronary Angiography;  Surgeon: Yvonne Kendall, MD;  Location: [facility] INVASIVE CV LAB;  Service: Cardiovascular;  Laterality: N/A;   CARDIAC CATHETERIZATION N/A 02/03/2016   Procedure: Intravascular Pressure Wire/FFR Study;  Surgeon: Yvonne Kendall, MD;  Location: [facility] INVASIVE CV LAB;  Service: Cardiovascular;  Laterality: N/A;   CARDIAC CATHETERIZATION N/A 02/03/2016   Procedure: Coronary Stent Intervention;  Surgeon: Yvonne Kendall, MD;  Location: [facility] INVASIVE CV LAB;  Service: Cardiovascular;  Laterality: N/A;   CARDIAC SURGERY     CARDIOVERSION N/A 08/25/2012   Procedure: CARDIOVERSION;  Surgeon: Vesta Mixer, MD;  Location: [facility] ENDOSCOPY;  Service: Cardiovascular;  Laterality: N/A;   CARDIOVERSION N/A 09/06/2012   Procedure: CARDIOVERSION/Bedside;  Surgeon: Luis Abed, MD;  Location: [facility] OR;  Service: Cardiovascular;  Laterality: N/A;   CARDIOVERSION N/A 01/12/2014   Procedure: CARDIOVERSION;  Surgeon: Chrystie Nose, MD;  Location: [facility]  ENDOSCOPY;  Service: Cardiovascular;  Laterality: N/A;   CARDIOVERSION N/A 01/30/2016   Procedure: CARDIOVERSION;  Surgeon: Quintella Reichert, MD;  Location: [facility] ENDOSCOPY;  Service: Cardiovascular;  Laterality: N/A;   CARDIOVERSION N/A 04/14/2016   Procedure: CARDIOVERSION;  Surgeon: Chrystie Nose, MD;  Location: [facility] ENDOSCOPY;  Service: Cardiovascular;  Laterality: N/A;   COLONOSCOPY N/A 12/04/2015   Procedure: COLONOSCOPY;  Surgeon: Malissa Hippo, MD;  [facility];  Service: Endoscopy;   Laterality: N/A;  730   COLONOSCOPY WITH PROPOFOL N/A 04/28/2019   Procedure: COLONOSCOPY WITH PROPOFOL;  Surgeon: Malissa Hippo, MD;  [facility];  Service: Endoscopy;  Laterality: N/A;   CORONARY ANGIOPLASTY     CORONARY ANGIOPLASTY WITH STENT PLACEMENT     ELECTROPHYSIOLOGIC STUDY N/A 02/18/2016   Procedure: Atrial Fibrillation Ablation;  Surgeon: Hillis Range, MD;  Location: [facility] INVASIVE CV LAB;  Service: Cardiovascular;  Laterality: N/A;   ENDOSCOPIC MUCOSAL RESECTION N/A 07/16/2022   Procedure: ENDOSCOPIC MUCOSAL RESECTION;  Surgeon: Meridee Score Netty Starring., MD;  Location: [facility] ENDOSCOPY;  Service: Gastroenterology;  Laterality: N/A;   ESOPHAGOGASTRODUODENOSCOPY (EGD) WITH PROPOFOL N/A 04/28/2019   Procedure: ESOPHAGOGASTRODUODENOSCOPY (EGD) WITH PROPOFOL;  Surgeon: Malissa Hippo, MD;  [facility];  Service: Endoscopy;  Laterality: N/A;  7:30   ESOPHAGOGASTRODUODENOSCOPY (EGD) WITH PROPOFOL N/A 02/28/2020   Procedure: ESOPHAGOGASTRODUODENOSCOPY (EGD) WITH PROPOFOL;  Surgeon: Malissa Hippo, MD;  [facility];  Service: Endoscopy;  Laterality: N/A;  1245   ESOPHAGOGASTRODUODENOSCOPY (EGD) WITH PROPOFOL N/A 04/16/2022   Procedure: ESOPHAGOGASTRODUODENOSCOPY (EGD) WITH PROPOFOL;  Surgeon: Dolores Frame, MD;  [facility];  Service: Gastroenterology;  Laterality: N/A;  730am, asa 1-2   ESOPHAGOGASTRODUODENOSCOPY (EGD) WITH PROPOFOL N/A 07/16/2022   Procedure: ESOPHAGOGASTRODUODENOSCOPY (EGD) WITH PROPOFOL;  Surgeon: Meridee Score Netty Starring., MD;  Location: [facility] ENDOSCOPY;  Service: Gastroenterology;  Laterality: N/A;   EUS N/A 07/16/2022   Procedure: UPPER ENDOSCOPIC ULTRASOUND (EUS) RADIAL;  Surgeon: Lemar Lofty., MD;  Location: [facility] ENDOSCOPY;  Service: Gastroenterology;  Laterality: N/A;   HEMOSTASIS CLIP PLACEMENT  07/16/2022   Procedure: HEMOSTASIS CLIP PLACEMENT;  Surgeon: Lemar Lofty., MD;  Location: [facility] ENDOSCOPY;   Service: Gastroenterology;;   implantable loop recorder removal  03/01/2019   MDT Reveal LINQ removed in office   INGUINAL HERNIA REPAIR Bilateral 06/04/2017   Procedure: OPEN BILATERAL INGUINAL HERNIA REPAIR;  Surgeon: Jimmye Norman, MD;  Location: [facility] OR;  Service: General;  Laterality: Bilateral;   LOOP RECORDER IMPLANT N/A 06/29/2014   Procedure: LOOP RECORDER IMPLANT;  Surgeon: Hillis Range, MD;  Location: [facility] CATH LAB;  Service: Cardiovascular;  Laterality: N/A;   NOSE SURGERY     sleep apnea surgery   PERIPHERAL VASCULAR CATHETERIZATION N/A 03/13/2016   Procedure: Thrombin Injection;  Surgeon: Nada Libman, MD;  Location: [facility] INVASIVE CV LAB;  Service: Cardiovascular;  Laterality: N/A;   POLYPECTOMY  04/28/2019   Procedure: POLYPECTOMY;  Surgeon: Malissa Hippo, MD;  [facility];  Service: Endoscopy;;  duodenum;splenic flexure;   STOMACH SURGERY     reflux, fundoplication   SUBMUCOSAL LIFTING INJECTION  07/16/2022   Procedure: SUBMUCOSAL LIFTING INJECTION;  Surgeon: Lemar Lofty., MD;  Location: [facility] ENDOSCOPY;  Service: Gastroenterology;;   TEE WITHOUT CARDIOVERSION N/A 08/25/2012   Procedure: TRANSESOPHAGEAL ECHOCARDIOGRAM (TEE);  Surgeon: Vesta Mixer, MD;  Location: [facility] ENDOSCOPY;  Service: Cardiovascular;  Laterality: N/A;   TEE WITHOUT CARDIOVERSION  03/23/2013   DR ROSS  TEE WITHOUT CARDIOVERSION N/A 03/23/2013   Procedure: TRANSESOPHAGEAL ECHOCARDIOGRAM (TEE);  Surgeon: Pricilla Riffle, MD;  Location: [facility] ENDOSCOPY;  Service: Cardiovascular;  Laterality: N/A;   TEE WITHOUT CARDIOVERSION N/A 03/26/2014   Procedure: TRANSESOPHAGEAL ECHOCARDIOGRAM (TEE);  Surgeon: Wendall Stade, MD;  Location: [facility] ENDOSCOPY;  Service: Cardiovascular;  Laterality: N/A;   TONSILLECTOMY      Current Outpatient Medications  Medication Sig Dispense Refill   albuterol (VENTOLIN HFA) 108 (90 Base) MCG/ACT inhaler Inhale 2 puffs into the lungs every 6 (six) hours as needed for wheezing  or shortness of breath. 8 g 0   atorvastatin (LIPITOR) 40 MG tablet Take 1 tablet (40 mg total) by mouth daily. 90 tablet 3   azelastine (ASTELIN) 0.1 % nasal spray Place 1 spray into both nostrils 2 (two) times daily as needed for rhinitis or allergies.     blood glucose meter kit and supplies Dispense based on patient and insurance preference. Use to check blood sugar once a day(FOR ICD-10 E10.9, E11.9). 1 each 0   Blood Glucose Monitoring Suppl (CONTOUR NEXT MONITOR) w/Device KIT as directed per package instructions twice a day     Cholecalciferol (VITAMIN D) 2000 UNITS CAPS Take 4,000 Units by mouth daily.      dofetilide (TIKOSYN) 500 MCG capsule TAKE 1 CAPSULE BY MOUTH TWICE  DAILY 180 capsule 1   enalapril (VASOTEC) 10 MG tablet Take 1 tablet (10 mg total) by mouth 2 (two) times daily. 180 tablet 3   fluticasone (FLONASE) 50 MCG/ACT nasal spray Place 1 spray into both nostrils daily as needed for allergies.      Fluticasone-Salmeterol (ADVAIR DISKUS) 250-50 MCG/DOSE AEPB Inhale 1 puff into the lungs every 12 (twelve) hours. 60 each 0   furosemide (LASIX) 40 MG tablet Take 1 tablet (40 mg total) by mouth as needed.     glucose blood (CONTOUR NEXT TEST) test strip as directed per package instructions In Vitro twice a day     hydrOXYzine (VISTARIL) 25 MG capsule Take 2 capsules (50 mg total) by mouth 3 (three) times daily as needed for anxiety. 30 capsule 0   loratadine (CLARITIN) 10 MG tablet Take 10 mg by mouth daily as needed for allergies.      Magnesium Oxide 400 MG CAPS Take one tablet by mouth twice daily (Patient taking differently: Take one tablet by mouth daily) 60 capsule 9   meloxicam (MOBIC) 15 MG tablet Take 15 mg by mouth daily as needed for pain.     metFORMIN (GLUCOPHAGE) 1000 MG tablet TAKE 1 TABLET BY MOUTH TWICE  DAILY WITH MEALS 30 tablet 0   metoprolol tartrate (LOPRESSOR) 50 MG tablet Take 50 mg by mouth daily.     nitroGLYCERIN (NITROSTAT) 0.4 MG SL tablet DISSOLVE 1  TABLET UNDER THE TONGUE EVERY 5 MINUTES AS  NEEDED FOR CHEST PAIN. MAX  OF 3 TABLETS IN 15 MINUTES. CALL 911 IF PAIN PERSISTS. 25 tablet 1   ondansetron (ZOFRAN) 4 MG tablet TAKE 1 TABLET BY MOUTH EVERY 8 HOURS AS NEEDED FOR NAUSEA AND VOMITING 30 tablet 0   oxybutynin (DITROPAN) 5 MG tablet TAKE ONE-HALF TABLET (2.5mg ) BY  MOUTH TWO TIMES DAILY AS  NEEDED FOR BLADDER SPASMS 30 tablet 5   potassium chloride SA (KLOR-CON) 20 MEQ tablet TAKE 1 TABLET BY MOUTH  DAILY 90 tablet 3   rivaroxaban (XARELTO) 20 MG TABS tablet Take 1 tablet (20 mg total) by mouth daily with supper. 30 tablet 5   Simethicone  80 MG TABS Take 1 tablet (80 mg total) by mouth every 6 (six) hours as needed. 60 tablet 1   vitamin B-12 (CYANOCOBALAMIN) 500 MCG tablet Take 500 mcg by mouth daily.     Vonoprazan Fumarate (VOQUEZNA) 10 MG TABS Take 10 mg by mouth daily at 6 (six) AM. 30 tablet 5   No current facility-administered medications for this visit.   Allergies:  Patient has no known allergies.   Social History: The patient  reports that he quit smoking about 12 years ago. His smoking use included cigarettes. He started smoking about 19 years ago. He has a 7.2 pack-year smoking history. He has never used smokeless tobacco. He reports that he does not drink alcohol and does not use drugs.   Family History: The patient's family history includes Anemia in his mother; Aneurysm in his maternal grandmother; CAD (age of onset: 12) in his father; Diabetes in his brother, father, mother, and paternal grandmother; Gout in his mother; Heart attack in his father; Stroke in his maternal grandfather and paternal grandfather; Transient ischemic attack in his father; Vascular Disease in his father.   ROS:  Please see the history of present illness. Otherwise, complete review of systems is positive for none.  All other systems are reviewed and negative.   Physical Exam: VS:  BP 118/76   Ht 6\' 1"  (1.854 m)   Wt 248 lb (112.5 kg)   SpO2 97%    BMI 32.72 kg/m , BMI Body mass index is 32.72 kg/m.  Wt Readings from Last 3 Encounters:  11/06/22 248 lb (112.5 kg)  10/15/22 241 lb (109.3 kg)  07/16/22 240 lb (108.9 kg)    General: Patient appears comfortable at rest. HEENT: Conjunctiva and lids normal, oropharynx clear with moist mucosa. Neck: Supple, no elevated JVP or carotid bruits, no thyromegaly. Lungs: Clear to auscultation, nonlabored breathing at rest. Cardiac: Regular rate and rhythm, no S3 or significant systolic murmur, no pericardial rub. Abdomen: Soft, nontender, no hepatomegaly, bowel sounds present, no guarding or rebound. Extremities: No pitting edema, distal pulses 2+. Skin: Warm and dry. Musculoskeletal: No kyphosis. Neuropsychiatric: Alert and oriented x3, affect grossly appropriate.  ECG:  An ECG dated 04/24/2022 was personally reviewed today and demonstrated:  Normal sinus rhythm, normal QTc interval  Recent Labwork: 04/09/2022: BUN 20; Creatinine, Ser 1.44; Potassium 4.5; Sodium 138     Component Value Date/Time   CHOL 125 05/15/2022 0923   CHOL 161 11/25/2018 1443   TRIG 140 05/15/2022 0923   HDL 33 (L) 05/15/2022 0923   HDL 35 (L) 11/25/2018 1443   CHOLHDL 3.8 05/15/2022 0923   VLDL 28 05/15/2022 0923   LDLCALC 64 05/15/2022 0923   LDLCALC 82 11/25/2018 1443    Other Studies Reviewed Today:   Assessment and Plan: Patient is a 57 year old M known to have CAD s/p LAD PCI in 01/2016, paroxysmal atrial fibrillation s/p multiple ablations currently on Tikosyn and AC, HTN, HLD, OSA, COPD presented to cardiology clinic for follow-up visit.  #Preop cardiac risk stratification for EGD: Low risk, hold Xarelto for 2-3 days prior to the procedure and resume when stable from bleeding standpoint.  # Persistent dizziness -Patient has dizziness most of the time, had a few falls recently.  EKG showed NSR, normal QTc interval and no conduction abnormalities.  Will obtain 3-day event monitor.  Will give work  excuse for the time he wears heart monitor as he does strenuous activity for work.  # CAD s/p LAD PCI in 01/2016  with normal LVEF, currently angina free -Not on baby aspirin due to Xarelto use -Continue atorvastatin 40 mg nightly -Continue metoprolol tartrate 50 mg once daily -Continue enalapril 10 mg twice daily -ER precautions for chest pain  # HLD, at goal -Continue atorvastatin 40 mg nightly, goal LDL less than 70.  # Paroxysmal A-fib s/p multiple ablations # Loop recorder removed -EKG today showed NSR, normal QTc interval -Continue Tikosyn 500 mcg twice daily -Continue Xarelto 20 mg once daily with meals -Follow-up with EP in 9/24  I have spent a total of 33 minutes with patient reviewing chart, EKGs, labs and examining patient as well as establishing an assessment and plan that was discussed with the patient.  > 50% of time was spent in direct patient care.     Medication Adjustments/Labs and Tests Ordered: Current medicines are reviewed at length with the patient today.  Concerns regarding medicines are outlined above.   Tests Ordered: Orders Placed This Encounter  Procedures   EKG 12-Lead    Medication Changes: No orders of the defined types were placed in this encounter.   Disposition:  Follow up  10 months  Signed, Austin Sandt Verne Spurr, MD, 11/06/2022 10:26 AM    [facility] [address], [place], [place] [address]

## 2022-11-18 ENCOUNTER — Encounter (INDEPENDENT_AMBULATORY_CARE_PROVIDER_SITE_OTHER): Payer: Self-pay | Admitting: Gastroenterology

## 2022-11-23 ENCOUNTER — Other Ambulatory Visit (INDEPENDENT_AMBULATORY_CARE_PROVIDER_SITE_OTHER): Payer: Self-pay | Admitting: *Deleted

## 2022-11-23 DIAGNOSIS — R110 Nausea: Secondary | ICD-10-CM

## 2022-11-23 DIAGNOSIS — R1010 Upper abdominal pain, unspecified: Secondary | ICD-10-CM

## 2022-11-23 DIAGNOSIS — R140 Abdominal distension (gaseous): Secondary | ICD-10-CM

## 2022-11-23 DIAGNOSIS — K219 Gastro-esophageal reflux disease without esophagitis: Secondary | ICD-10-CM

## 2022-11-23 MED ORDER — VOQUEZNA 10 MG PO TABS
10.0000 mg | ORAL_TABLET | Freq: Every day | ORAL | 5 refills | Status: DC
Start: 2022-11-23 — End: 2023-05-04

## 2022-11-26 ENCOUNTER — Encounter (HOSPITAL_COMMUNITY): Payer: Self-pay | Admitting: Gastroenterology

## 2022-11-26 NOTE — Progress Notes (Signed)
Attempted to obtain medical history via telephone, unable to reach at this time. HIPAA compliant voicemail message left requesting return call to pre surgical testing department. 

## 2022-11-27 ENCOUNTER — Other Ambulatory Visit (INDEPENDENT_AMBULATORY_CARE_PROVIDER_SITE_OTHER): Payer: Self-pay | Admitting: Gastroenterology

## 2022-11-30 NOTE — Telephone Encounter (Signed)
Last seen 06/22/22

## 2022-12-02 ENCOUNTER — Encounter (HOSPITAL_COMMUNITY): Payer: Self-pay | Admitting: Registered Nurse

## 2022-12-02 ENCOUNTER — Telehealth: Payer: Self-pay

## 2022-12-02 DIAGNOSIS — R42 Dizziness and giddiness: Secondary | ICD-10-CM | POA: Diagnosis not present

## 2022-12-02 NOTE — Telephone Encounter (Signed)
The pt has been cancelled, cardiac clearance to be requested and rescheduled   Left message on machine to call back      Sorry for the late notice ,this pt just called me back and he is for tomorrow.  Hx: Afib, CAD, Asthma, DVT, Wolff-Parkinson-White syndrome (says resolved in history), cardiac ablations. Last office visit with cards was 8/2 where he did complain of some recent dizziness so he ordered a 3 day heart monitor, per patient hasnt worn yet, will put on after procedure. Patient said not having any new issues like chest pain/SOB.  Anesthesia Review: Yes  11:17 AM KG Bethel Born, PA-C I would get cardiac clearance due to pending study  11:36 AM You were added by Beatrix Shipper, RN. 11:47 AM CB Beatrix Shipper, RN Hey Tamaria Dunleavy- sorry for late notice this pt just called me back but he will need cards clearance  11:48 AM Lemar Lofty., MD was added by you. 11:49 AM Lemar Lofty., MD Thanks for letting us know  GM We'll get him rescheduled as able. Roselind Klus, need Cardiac clearnace.

## 2022-12-02 NOTE — Telephone Encounter (Signed)
I have also sent a message to the pt My Chart- he does view his messages

## 2022-12-02 NOTE — Progress Notes (Signed)
Austin Woods  [name] Cardiologist-Vishnu Mallipeddi MD  EKG-11/06/22 Echo-06/05/20 Cath- 2017 Stress-11/27/22 ICD/PM-n/a Blood thinner-Xarelto  GLP-1- n/a   Hx: Afib, CAD, Asthma, DVT, Wolff-Parkinson-White syndrome (says resolved in history), cardiac ablations. Last office visit with cards was 8/2 where he did complain of some recent dizziness so he ordered a 3 day heart monitor, per patient hasnt worn yet, will put on after procedure. Patient said not having any new issues like chest pain/SOB.  Anesthesia Review: Yes

## 2022-12-03 ENCOUNTER — Ambulatory Visit (HOSPITAL_COMMUNITY)
Admission: RE | Admit: 2022-12-03 | Payer: BC Managed Care – PPO | Source: Ambulatory Visit | Admitting: Gastroenterology

## 2022-12-03 ENCOUNTER — Telehealth (INDEPENDENT_AMBULATORY_CARE_PROVIDER_SITE_OTHER): Payer: Self-pay

## 2022-12-03 SURGERY — UPPER ENDOSCOPIC ULTRASOUND (EUS) RADIAL
Anesthesia: Monitor Anesthesia Care

## 2022-12-03 NOTE — Telephone Encounter (Signed)
I called the patient to discuss his concerns.  Unfortunately, he did not pick up the phone, I left a detailed voice message.  I explained to him that given the concerns for cardiac symptoms, he will be in his best interest to have this evaluated first and obtain clearance prior to proceeding with endoscopic procedures.  I explained that intraprocedural safety came first and it was important to make sure he will not have a cardiopulmonary decompensation during the procedure. Also explained that the areas showing increased activity in his PET scan will need to be further evaluated with the endoscopic ultrasound as recommended by Dr. Meridee Score, but we will refer him to oncology as well for comprehensive care.  Ann, can you please refer the patient to oncology at [facility]?  Diagnosis: Gastric neuroendocrine tumor  Thanks,   Katrinka Blazing, MD Gastroenterology and Hepatology [facility]astro[facility]

## 2022-12-03 NOTE — Telephone Encounter (Signed)
  UPPER ENDOSCOPIC ULTRASOUND (EUS) RADIAL (canceled) Procedure  Patient called upset says that Dr. Aaron Edelman cancelled this yesterday afternoon stating he needed cardiac clearance.   He wants to be referred to a cancer doctor as he says there were some spots on his pet scan. He also says there is something wrong with his prostate.    Please advise.

## 2022-12-08 NOTE — Telephone Encounter (Signed)
Thanks

## 2022-12-08 NOTE — Telephone Encounter (Signed)
Referral sent, they will contact patient with apt

## 2022-12-08 NOTE — Telephone Encounter (Signed)
Cardiac clearance has been sent to the pt cardiologist.  Will await response.

## 2022-12-11 ENCOUNTER — Ambulatory Visit: Payer: BC Managed Care – PPO | Attending: Cardiovascular Disease | Admitting: Cardiovascular Disease

## 2022-12-11 VITALS — BP 118/70 | HR 78 | Ht 73.0 in | Wt 252.4 lb

## 2022-12-11 DIAGNOSIS — I10 Essential (primary) hypertension: Secondary | ICD-10-CM

## 2022-12-11 DIAGNOSIS — I4819 Other persistent atrial fibrillation: Secondary | ICD-10-CM

## 2022-12-11 DIAGNOSIS — Z79899 Other long term (current) drug therapy: Secondary | ICD-10-CM

## 2022-12-11 NOTE — Addendum Note (Signed)
Addended by: Lesle Chris on: 12/11/2022 03:30 PM   Modules accepted: Orders

## 2022-12-11 NOTE — Progress Notes (Signed)
  Electrophysiology Office Note:    Date:  12/11/2022   ID:  Austin Woods, DOB 28-Sep-1965, MRN [id number]  PCP:  Antonietta Jewel   [facility] Providers Cardiologist:  Marjo Bicker, MD Electrophysiologist:  Maurice Small, MD     Referring MD: Dion Saucier, PA-C   History of Present Illness:    Austin Woods is a 57 y.o. male with a medical history significant for persistent atrial fibrillation, obesity, obstructive sleep apnea, who presents for electrophysiology follow-up.     Patient has a history of atrial fibrillation and has undergone ablation on 2 occasions.  He has since been started on Tikosyn and has been maintaining sinus rhythm.  Recalls that he was taken off Tikosyn for brief period in the past but immediately had recurrence of atrial fibrillation.  He is symptomatic with rapid rates and palpitations when he does have atrial fibrillation.      Today, he reports that he is doing very well from a rhythm standpoint.  He has not had any recurrence of rapid rates or palpitations while on Tikosyn.  EKGs/Labs/Other Studies Reviewed Today:    Echocardiogram:  TTE June 05, 2020 LVEF 60 to 65%.  Left atrium normal in size   Monitors:  Ordered by Dr. Jenene Slicker Results not available  Stress testing:  Reviewed in Epic  Advanced imaging:   Cardiac catherization   EKG:   EKG Interpretation Date/Time:  Friday December 11 2022 14:53:04 EDT Ventricular Rate:  62 PR Interval:  154 QRS Duration:  80 QT Interval:  456 QTC Calculation: 462 R Axis:   1  Text Interpretation: Normal sinus rhythm Possible Inferior infarct , age undetermined When compared with ECG of 06-Nov-2022 10:26, Premature atrial complexes are no longer Present Confirmed by York Pellant [phone]) on 12/11/2022 3:06:04 PM     Physical Exam:    VS:  BP 118/70   Pulse 78   Ht 6\' 1"  (1.854 m)   Wt 252 lb 6.4 oz (114.5 kg)   SpO2 98%   BMI 33.30 kg/m     Wt  Readings from Last 3 Encounters:  12/11/22 252 lb 6.4 oz (114.5 kg)  11/06/22 248 lb (112.5 kg)  10/15/22 241 lb (109.3 kg)     GEN: Well nourished, well developed in no acute distress CARDIAC: RRR, no murmurs, rubs, gallops RESPIRATORY:  Normal work of breathing MUSCULOSKELETAL: no edema    ASSESSMENT & PLAN:    Paroxysmal atrial fibrillation Status post AF ablation x2 Currently on Tikosyn, maintaining sinus rhythm   High risk medication-Tikosyn On 500 mcg twice daily EKG reviewed today; TC is within acceptable limits Will check BMET and Mg today  Secondary hypercoagulable state Continue Xarelto 20 mg with evening meal      Signed, Maurice Small, MD  12/11/2022 3:18 PM    [facility]

## 2022-12-11 NOTE — Patient Instructions (Addendum)
Medication Instructions:   Continue all current medications.   Labwork:  BMET, Mg - orders given Office will contact with results via phone, letter or mychart.     Testing/Procedures:  none  Follow-Up:  6 months   Any Other Special Instructions Will Be Listed Below (If Applicable).   If you need a refill on your cardiac medications before your next appointment, please call your pharmacy.

## 2022-12-12 DIAGNOSIS — R42 Dizziness and giddiness: Secondary | ICD-10-CM | POA: Diagnosis not present

## 2022-12-13 ENCOUNTER — Encounter: Payer: Self-pay | Admitting: Hematology

## 2022-12-13 NOTE — Progress Notes (Signed)
[facility] [address] [place] [address]   Clinic Day:  12/14/2022  Referring physician: Marguerita Woods, Austin Woods*  Patient Care Team: Austin Woods as PCP - General (Physician Assistant) Austin Bicker, MD as PCP - Cardiology (Cardiology) Austin Woods, Austin Gaudy, MD as PCP - Electrophysiology (Cardiology) Austin Massed, MD as Medical Oncologist (Medical Oncology)   ASSESSMENT & PLAN:   Assessment:  1.  Gastric carcinoid tumor: - Has history of GERD for 20+ years.  Underwent Nissen's fundoplication 20 years ago at [facility]. - No B symptoms.  No flushing.  Has wheezing from asthma and uses Advair inhaler daily.  Reports soft stools for the past 1 year. - EGD and gastric nodule biopsy (04/16/2022): Well-differentiated neuroendocrine tumor.  Transitional type mucosa with mild PPI effect in the stomach.  Ki-67 3-10%.  Overall grade 1 well-differentiated neuroendocrine tumor. - EGD/EUS (07/16/2022): 3 5-7 mm submucosal nodules with no bleeding and no stigmata of recent bleeding were found in the anterior wall of the gastric body (between 50-52 cm from incisors).  Status post endoscopic mucosal resection.  No malignant lymph nodes were visualized in the paracardial, left gastric and gastrohepatic ligament and celiac region by EUS. 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  Stomach nodule #3 biopsy shows fundic gland polyp with linear and focal nodular neuroendocrine cell hyperplasia.  No malignancy identified.  Stomach biopsy also showed gastric antral and oxyntic mucosa with features of reactive gastropathy, negative for H. pylori.  Ki-67 was less than 3%.  Tumor cells positive for synaptophysin and CD56. - Gastrin level: 880 (08/28/2022), 423 (06/09/2022) - Chromogranin A (06/09/2022): 1228, 1602 on 08/28/2022. - Dotatate  PET scan (10/12/2022): Increased uptake within the wall of the proximal stomach just below the GE junction with SUV 8.9.  No abdominal pelvic lymphadenopathy.  Increased uptake in the prostate gland with SUV 8.7.  New indeterminate nodule within the right lung base without increased uptake measuring 1 cm.  2.  Social/family history: - Lives at home with his wife.  Works as a Engineer, petroleum at [employer].  Quit smoking 12 years ago.  Smoked half pack per day for 3 years. - No family history of malignancy.   Plan:  1.  Gastric carcinoid: - I have reviewed scan images with the patient and his wife.  He underwent EMR on 07/15/2021 of 3 nodules.  I have also discussed biopsy results in detail. - Recommend repeating gastrin and chromogranin levels. - Will also obtain 24-hour urine for 5-HIAA. - Recommend CT of the chest with contrast in a month to follow-up on the right lung nodule. - He complained of right hip pain for the past 2 months, mostly on weightbearing, likely from arthritis.  No uptake on the PET scan in that region. - Recommend repeat EGD/EUS by Dr. Meridee Woods as the PET scan showed increased uptake within the wall of the proximal stomach. - Will also check PSA level due to increased uptake on the PET scan. - RTC 6 weeks for follow-up.   Orders Placed This Encounter  Procedures   CT Chest W Contrast    Standing Status:   Future    Standing Expiration Date:   12/14/2023    Order Specific Question:   If indicated for the ordered procedure, I authorize the administration of contrast media per Radiology protocol    Answer:  Yes    Order Specific Question:   Does the patient have a contrast media/X-ray dye allergy?    Answer:   No    Order Specific Question:   Preferred imaging location?    Answer:   [facility]    Order Specific Question:   Release to patient    Answer:   Immediate [1]   PSA    Standing Status:   Future    Number of Occurrences:   1    Standing  Expiration Date:   12/14/2023   5 HIAA, quantitative, urine, 24 hour    Standing Status:   Future    Standing Expiration Date:   12/14/2023   Chromogranin A    Standing Status:   Future    Number of Occurrences:   1    Standing Expiration Date:   12/14/2023   Gastrin    Standing Status:   Future    Number of Occurrences:   1    Standing Expiration Date:   12/14/2023   Basic metabolic panel    Standing Status:   Future    Number of Occurrences:   1    Standing Expiration Date:   12/14/2023   Magnesium    Standing Status:   Future    Number of Occurrences:   1    Standing Expiration Date:   12/14/2023      I,Austin Woods,acting as a scribe for Austin Massed, MD.,have documented all relevant documentation on the behalf of Austin Massed, MD,as directed by  Austin Massed, MD while in the presence of Austin Massed, MD.   I, Austin Massed MD, have reviewed the above documentation for accuracy and completeness, and I agree with the above.   Austin Massed, MD   9/9/20245:13 PM  CHIEF COMPLAINT/PURPOSE OF CONSULT:   Diagnosis: well differentiated neuroendocrine tumor   Cancer Staging  No matching staging information was found for the patient.    Prior Therapy: Endoscopic mucosal resection  Current Therapy: Surveillance   HISTORY OF PRESENT ILLNESS:   Oncology History   No history exists.      Austin Woods is a 57 y.o. male presenting to clinic today for evaluation of grade 1 NET at the request of Dr. Meridee Woods.  He has been followed intermittently for a history of GERD. His last EGD from 02/2020 showed only erosive gastropathy. He returned for follow up in 03/2022 with worsening symptoms, including frequent regurgitation, heartburn, cough, and a tight feeling in his abdomen. He underwent a repeat EGD on 04/16/22 under Dr. Levon Woods and was incidentally found to have a 5 mm mucosal papule on anterior stomach wall. Pathology from the nodule revealed  well-differentiated neuroendocrine tumor, with a Ki-67 of 3-10%. A staging CT A/P from 05/20/22 showed no evidence of intra-abdominal or pelvic pathology or metastatic disease.  He was taken for EGD with EUS on 07/16/22 under Dr. Meridee Woods. EGD showed three submucosal papules in anterior portion of body of stomach, which were completely removed. EUS showed no evidence of significant subepithelial/submucosal lesions or malignant-appearing lymph nodes. Pathology from the nodules showed grade 1 NET to only one of the nodules. A second showed only focal nodular neuroendocrine cell hyperplasia.  He underwent staging DOTATATE PET scan on 10/12/22 showing: nonspecific increased radiotracer uptake within wall of proximal stomach just below GE junction; no signs of tracer-avid nodal or solid organ metastasis; new indeterminate 1 cm nodule within right lung base without corresponding increased tracer uptake; increased radiotracer activity within prostate  gland.  He was scheduled for repeat EUS on 12/03/22, but the procedure was cancelled in order to obtain cardiac clearance.  Today, he states that he is doing well overall. His appetite level is at 100%. His energy level is at 40%.  PAST MEDICAL HISTORY:   Past Medical History: Past Medical History:  Diagnosis Date   A-fib (HCC)    Allergy    All my life   Anemia    iron def anemia   Arthritis    Asthma    Coronary artery disease    Dysrhythmia    in and out Afib   GERD (gastroesophageal reflux disease)    Headache    High cholesterol    History of DVT (deep vein thrombosis)    a. left leg following ablation for SVT   Hypertension    Persistent atrial fibrillation (HCC)    a. Dx 08/2012, xarelto initiated; b. 09/2012 Tikosyn initiated -> DCCV -> Sinus c. PVI 03/2013 d. 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   Type 2 diabetes mellitus without complication, without long-term current use of insulin (HCC) 02/16/2018    Wolff-Parkinson-White (WPW) syndrome    resolved s/p RFCA 2001 by Dr Graciela Husbands    Surgical History: Past Surgical History:  Procedure Laterality Date   ATRIAL FIBRILLATION ABLATION  03/23/2013   PVI by DR Johney Frame for afib   ATRIAL FIBRILLATION ABLATION N/A 03/23/2013   Procedure: ATRIAL FIBRILLATION ABLATION;  Surgeon: Gardiner Rhyme, MD;  Location: [facility] CATH LAB;  Service: Cardiovascular;  Laterality: N/A;   ATRIAL FIBRILLATION ABLATION N/A 03/27/2014   PVI Dr Allyne Gee SURGERY     BIOPSY  02/28/2020   Procedure: BIOPSY;  Surgeon: Malissa Hippo, MD;  Location: [facility] ENDO [facility];  Service: Endoscopy;;  antral(pre-pyloric)    BIOPSY  04/16/2022   Procedure: BIOPSY;  Surgeon: Dolores Frame, MD;  Location: [facility];  Service: Gastroenterology;;   BIOPSY  07/16/2022   Procedure: BIOPSY;  Surgeon: Lemar Lofty., MD;  Location: [facility];  Service: Gastroenterology;;   CARDIAC CATHETERIZATION N/A 02/03/2016   Procedure: Left Heart Cath and Coronary Angiography;  Surgeon: Yvonne Kendall, MD;  Location: [facility] INVASIVE CV LAB;  Service: Cardiovascular;  Laterality: N/A;   CARDIAC CATHETERIZATION N/A 02/03/2016   Procedure: Intravascular Pressure Wire/FFR Study;  Surgeon: Yvonne Kendall, MD;  Location: [facility] INVASIVE CV LAB;  Service: Cardiovascular;  Laterality: N/A;   CARDIAC CATHETERIZATION N/A 02/03/2016   Procedure: Coronary Stent Intervention;  Surgeon: Yvonne Kendall, MD;  Location: [facility] INVASIVE CV LAB;  Service: Cardiovascular;  Laterality: N/A;   CARDIAC SURGERY     CARDIOVERSION N/A 08/25/2012   Procedure: CARDIOVERSION;  Surgeon: Vesta Mixer, MD;  Location: [facility] ENDOSCOPY;  Service: Cardiovascular;  Laterality: N/A;   CARDIOVERSION N/A 09/06/2012   Procedure: CARDIOVERSION/Bedside;  Surgeon: Luis Abed, MD;  Location: [facility] OR;  Service: Cardiovascular;  Laterality: N/A;   CARDIOVERSION N/A 01/12/2014   Procedure: CARDIOVERSION;  Surgeon: Chrystie Nose,  MD;  Location: [facility] ENDOSCOPY;  Service: Cardiovascular;  Laterality: N/A;   CARDIOVERSION N/A 01/30/2016   Procedure: CARDIOVERSION;  Surgeon: Quintella Reichert, MD;  Location: [facility] ENDOSCOPY;  Service: Cardiovascular;  Laterality: N/A;   CARDIOVERSION N/A 04/14/2016   Procedure: CARDIOVERSION;  Surgeon: Chrystie Nose, MD;  Location: [facility] ENDOSCOPY;  Service: Cardiovascular;  Laterality: N/A;   COLONOSCOPY N/A 12/04/2015   Procedure: COLONOSCOPY;  Surgeon:  Malissa Hippo, MD;  [facility];  Service: Endoscopy;  Laterality: N/A;  730   COLONOSCOPY WITH PROPOFOL N/A 04/28/2019   Procedure: COLONOSCOPY WITH PROPOFOL;  Surgeon: Malissa Hippo, MD;  [facility]: [facility];  Service: Endoscopy;  Laterality: N/A;   CORONARY ANGIOPLASTY     CORONARY ANGIOPLASTY WITH STENT PLACEMENT     ELECTROPHYSIOLOGIC STUDY N/A 02/18/2016   Procedure: Atrial Fibrillation Ablation;  Surgeon: Hillis Range, MD;  Location: [facility] INVASIVE CV LAB;  Service: Cardiovascular;  Laterality: N/A;   ENDOSCOPIC MUCOSAL RESECTION N/A 07/16/2022   Procedure: ENDOSCOPIC MUCOSAL RESECTION;  Surgeon: Austin Woods Netty Starring., MD;  Location: [facility] ENDOSCOPY;  Service: Gastroenterology;  Laterality: N/A;   ESOPHAGOGASTRODUODENOSCOPY (EGD) WITH PROPOFOL N/A 04/28/2019   Procedure: ESOPHAGOGASTRODUODENOSCOPY (EGD) WITH PROPOFOL;  Surgeon: Malissa Hippo, MD;  [facility]: [facility];  Service: Endoscopy;  Laterality: N/A;  7:30   ESOPHAGOGASTRODUODENOSCOPY (EGD) WITH PROPOFOL N/A 02/28/2020   Procedure: ESOPHAGOGASTRODUODENOSCOPY (EGD) WITH PROPOFOL;  Surgeon: Malissa Hippo, MD;  [facility];  Service: Endoscopy;  Laterality: N/A;  1245   ESOPHAGOGASTRODUODENOSCOPY (EGD) WITH PROPOFOL N/A 04/16/2022   Procedure: ESOPHAGOGASTRODUODENOSCOPY (EGD) WITH PROPOFOL;  Surgeon: Dolores Frame, MD;  [facility]: [facility];  Service: Gastroenterology;  Laterality: N/A;  730am, asa 1-2   ESOPHAGOGASTRODUODENOSCOPY (EGD)  WITH PROPOFOL N/A 07/16/2022   Procedure: ESOPHAGOGASTRODUODENOSCOPY (EGD) WITH PROPOFOL;  Surgeon: Austin Woods Netty Starring., MD;  Location: [facility] ENDOSCOPY;  Service: Gastroenterology;  Laterality: N/A;   EUS N/A 07/16/2022   Procedure: UPPER ENDOSCOPIC ULTRASOUND (EUS) RADIAL;  Surgeon: Lemar Lofty., MD;  Location: [facility] ENDOSCOPY;  Service: Gastroenterology;  Laterality: N/A;   HEMOSTASIS CLIP PLACEMENT  07/16/2022   Procedure: HEMOSTASIS CLIP PLACEMENT;  Surgeon: Lemar Lofty., MD;  Location: [facility] ENDOSCOPY;  Service: Gastroenterology;;   HERNIA REPAIR     10 years   implantable loop recorder removal  03/01/2019   MDT Reveal LINQ removed in office   INGUINAL HERNIA REPAIR Bilateral 06/04/2017   Procedure: OPEN BILATERAL INGUINAL HERNIA REPAIR;  Surgeon: Jimmye Norman, MD;  Location: [facility] OR;  Service: General;  Laterality: Bilateral;   LOOP RECORDER IMPLANT N/A 06/29/2014   Procedure: LOOP RECORDER IMPLANT;  Surgeon: Hillis Range, MD;  Location: [facility] CATH LAB;  Service: Cardiovascular;  Laterality: N/A;   NOSE SURGERY     sleep apnea surgery   PERIPHERAL VASCULAR CATHETERIZATION N/A 03/13/2016   Procedure: Thrombin Injection;  Surgeon: Nada Libman, MD;  Location: [facility] INVASIVE CV LAB;  Service: Cardiovascular;  Laterality: N/A;   POLYPECTOMY  04/28/2019   Procedure: POLYPECTOMY;  Surgeon: Malissa Hippo, MD;  Location: [facility];  Service: Endoscopy;;  duodenum;splenic flexure;   SPINE SURGERY     10 years agao   STOMACH SURGERY     reflux, fundoplication   SUBMUCOSAL LIFTING INJECTION  07/16/2022   Procedure: SUBMUCOSAL LIFTING INJECTION;  Surgeon: Lemar Lofty., MD;  Location: [facility] ENDOSCOPY;  Service: Gastroenterology;;   TEE WITHOUT CARDIOVERSION N/A 08/25/2012   Procedure: TRANSESOPHAGEAL ECHOCARDIOGRAM (TEE);  Surgeon: Vesta Mixer, MD;  Location: [facility] ENDOSCOPY;  Service: Cardiovascular;  Laterality: N/A;   TEE WITHOUT CARDIOVERSION  03/23/2013   DR  ROSS   TEE WITHOUT CARDIOVERSION N/A 03/23/2013   Procedure: TRANSESOPHAGEAL ECHOCARDIOGRAM (TEE);  Surgeon: Pricilla Riffle, MD;  Location: [facility] ENDOSCOPY;  Service: Cardiovascular;  Laterality: N/A;   TEE WITHOUT CARDIOVERSION N/A 03/26/2014   Procedure: TRANSESOPHAGEAL ECHOCARDIOGRAM (TEE);  Surgeon: Wendall Stade, MD;  Location: [facility] ENDOSCOPY;  Service: Cardiovascular;  Laterality: N/A;   TONSILLECTOMY      Social History: Social History   Socioeconomic History   Marital status: Married    Spouse name: Not on file   Number of children: 2   Years of education: Not on file   Highest education level: Not on file  Occupational History    Employer: [employer]  Tobacco Use   Smoking status: Former    Current packs/day: 0.00    Average packs/day: 1 pack/day for 7.2 years (7.2 ttl pk-yrs)    Types: Cigarettes    Start date: 05/19/2003    Quit date: 08/11/2012    Years since quitting: 10.3   Smokeless tobacco: Never  Vaping Use   Vaping status: Never Used  Substance and Sexual Activity   Alcohol use: No   Drug use: No   Sexual activity: Yes    Birth control/protection: None  Other Topics Concern   Not on file  Social History Narrative   Pt lives in [place] with wife.  Works at [employer]: Not on [employer] Insecurity: Not on file  Transportation Needs: Not on file  Physical Activity: Not on file  Stress: Not on file  Social Connections: Not on file  Intimate Partner Violence: Not on file    Family History: Family History  Problem Relation Age of Onset   Diabetes Mother    Anemia Mother    Gout Mother    Kidney disease Mother    Obesity Mother    CAD Father 32   Diabetes Father    Heart attack Father    Transient ischemic attack Father    Vascular Disease Father    Heart disease Father    Hypertension Father    Obesity Father    Stroke Father    Diabetes Brother    Aneurysm Maternal Grandmother    Stroke  Maternal Grandfather    Diabetes Paternal Grandmother    Stroke Paternal Grandfather     Current Medications:  Current Outpatient Medications:    albuterol (VENTOLIN HFA) 108 (90 Base) MCG/ACT inhaler, Inhale 2 puffs into the lungs every 6 (six) hours as needed for wheezing or shortness of breath., Disp: 8 g, Rfl: 0   atorvastatin (LIPITOR) 40 MG tablet, Take 1 tablet (40 mg total) by mouth daily., Disp: 90 tablet, Rfl: 3   azelastine (ASTELIN) 0.1 % nasal spray, Place 1 spray into both nostrils 2 (two) times daily as needed for rhinitis or allergies., Disp: , Rfl:    blood glucose meter kit and supplies, Dispense based on patient and insurance preference. Use to check blood sugar once a day(FOR ICD-10 E10.9, E11.9)., Disp: 1 each, Rfl: 0   Blood Glucose Monitoring Suppl (CONTOUR NEXT MONITOR) w/Device KIT, as directed per package instructions twice a day, Disp: , Rfl:    budesonide-formoterol (SYMBICORT) 160-4.5 MCG/ACT inhaler, 1 puff as needed Inhalation every 4 hrs, Disp: , Rfl:    Cholecalciferol (VITAMIN D) 2000 UNITS CAPS, Take 4,000 Units by mouth daily. , Disp: , Rfl:    dofetilide (TIKOSYN) 500 MCG capsule, TAKE 1 CAPSULE BY MOUTH TWICE  DAILY, Disp: 180 capsule, Rfl: 1   enalapril (VASOTEC) 10 MG tablet, Take 1 tablet (10 mg total) by mouth 2 (two) times daily., Disp: 180 tablet, Rfl: 3   fluticasone (FLONASE) 50 MCG/ACT nasal spray, Place 1 spray into both nostrils daily as needed  for allergies. , Disp: , Rfl:    Fluticasone-Salmeterol (ADVAIR DISKUS) 250-50 MCG/DOSE AEPB, Inhale 1 puff into the lungs every 12 (twelve) hours., Disp: 60 each, Rfl: 0   furosemide (LASIX) 40 MG tablet, Take 1 tablet (40 mg total) by mouth as needed., Disp: , Rfl:    glucose blood (CONTOUR NEXT TEST) test strip, as directed per package instructions In Vitro twice a day, Disp: , Rfl:    hydrOXYzine (VISTARIL) 25 MG capsule, Take 2 capsules (50 mg total) by mouth 3 (three) times daily as needed for  anxiety., Disp: 30 capsule, Rfl: 0   loratadine (CLARITIN) 10 MG tablet, Take 10 mg by mouth daily as needed for allergies. , Disp: , Rfl:    Magnesium Oxide 400 MG CAPS, Take one tablet by mouth twice daily (Patient taking differently: Take one tablet by mouth daily), Disp: 60 capsule, Rfl: 9   meloxicam (MOBIC) 15 MG tablet, Take 15 mg by mouth daily as needed for pain., Disp: , Rfl:    metFORMIN (GLUCOPHAGE) 1000 MG tablet, TAKE 1 TABLET BY MOUTH TWICE  DAILY WITH MEALS, Disp: 30 tablet, Rfl: 0   metoprolol tartrate (LOPRESSOR) 50 MG tablet, Take 50 mg by mouth daily., Disp: , Rfl:    nitroGLYCERIN (NITROSTAT) 0.4 MG SL tablet, DISSOLVE 1 TABLET UNDER THE TONGUE EVERY 5 MINUTES AS  NEEDED FOR CHEST PAIN. MAX  OF 3 TABLETS IN 15 MINUTES. CALL 911 IF PAIN PERSISTS., Disp: 25 tablet, Rfl: 1   ondansetron (ZOFRAN) 4 MG tablet, TAKE 1 TABLET BY MOUTH EVERY 8 HOURS AS NEEDED FOR NAUSEA AND VOMITING, Disp: 30 tablet, Rfl: 0   oxybutynin (DITROPAN) 5 MG tablet, TAKE ONE-HALF TABLET (2.5mg ) BY  MOUTH TWO TIMES DAILY AS  NEEDED FOR BLADDER SPASMS, Disp: 30 tablet, Rfl: 5   potassium chloride SA (KLOR-CON) 20 MEQ tablet, TAKE 1 TABLET BY MOUTH  DAILY, Disp: 90 tablet, Rfl: 3   rivaroxaban (XARELTO) 20 MG TABS tablet, Take 1 tablet (20 mg total) by mouth daily with supper., Disp: 30 tablet, Rfl: 5   Simethicone 80 MG TABS, Take 1 tablet (80 mg total) by mouth every 6 (six) hours as needed., Disp: 60 tablet, Rfl: 1   vitamin B-12 (CYANOCOBALAMIN) 500 MCG tablet, Take 500 mcg by mouth daily., Disp: , Rfl:    Vonoprazan Fumarate (VOQUEZNA) 10 MG TABS, Take 10 mg by mouth daily at 6 (six) AM., Disp: 30 tablet, Rfl: 5   Allergies: No Known Allergies  REVIEW OF SYSTEMS:   Review of Systems  Constitutional:  Negative for chills, fatigue and fever.  HENT:   Negative for lump/mass, mouth sores, nosebleeds, sore throat and trouble swallowing.   Eyes:  Negative for eye problems.  Respiratory:  Positive for cough  and shortness of breath.   Cardiovascular:  Negative for chest pain, leg swelling and palpitations.  Gastrointestinal:  Negative for abdominal pain, constipation, diarrhea, nausea and vomiting.  Genitourinary:  Negative for bladder incontinence, difficulty urinating, dysuria, frequency, hematuria and nocturia.   Musculoskeletal:  Positive for arthralgias (Right hip). Negative for back pain, flank pain, myalgias and neck pain.  Skin:  Negative for itching and rash.  Neurological:  Positive for headaches. Negative for dizziness and numbness.  Hematological:  Does not bruise/bleed easily.  Psychiatric/Behavioral:  Negative for depression, sleep disturbance and suicidal ideas. The patient is not nervous/anxious.   All other systems reviewed and are negative.    VITALS:   Blood pressure 135/66, pulse (!) 59, temperature 98  F (36.7 C), temperature source Oral, resp. rate 18, height 6\' 1"  (1.854 m), weight 249 lb 6.4 oz (113.1 kg), SpO2 97%.  Wt Readings from Last 3 Encounters:  12/14/22 249 lb 6.4 oz (113.1 kg)  12/11/22 252 lb 6.4 oz (114.5 kg)  11/06/22 248 lb (112.5 kg)    Body mass index is 32.9 kg/m.  Performance status (ECOG): 0 - Asymptomatic  PHYSICAL EXAM:   Physical Exam Vitals and nursing note reviewed. Exam conducted with a chaperone present.  Constitutional:      Appearance: Normal appearance.  Cardiovascular:     Rate and Rhythm: Normal rate and regular rhythm.     Pulses: Normal pulses.     Heart sounds: Normal heart sounds.  Pulmonary:     Effort: Pulmonary effort is normal.     Breath sounds: Normal breath sounds.  Abdominal:     Palpations: Abdomen is soft. There is no hepatomegaly, splenomegaly or mass.     Tenderness: There is no abdominal tenderness.  Musculoskeletal:     Right lower leg: No edema.     Left lower leg: No edema.  Lymphadenopathy:     Cervical: No cervical adenopathy.     Right cervical: No superficial, deep or posterior cervical  adenopathy.    Left cervical: No superficial, deep or posterior cervical adenopathy.     Upper Body:     Right upper body: No supraclavicular or axillary adenopathy.     Left upper body: No supraclavicular or axillary adenopathy.  Neurological:     General: No focal deficit present.     Mental Status: He is alert and oriented to person, place, and time.  Psychiatric:        Mood and Affect: Mood normal.        Behavior: Behavior normal.     LABS:      Latest Ref Rng & Units 01/29/2020    9:30 AM 08/07/2019   12:50 PM 04/26/2019   10:15 AM  CBC  WBC 3.4 - 10.8 x10E3/uL 7.6  8.6  8.7   Hemoglobin 13.0 - 17.7 g/dL 78.4  69.6  29.5   Hematocrit 37.5 - 51.0 % 42.1  46.8  36.8   Platelets 150 - 450 x10E3/uL 357  285  275       Latest Ref Rng & Units 12/14/2022    9:07 AM 04/09/2022    3:24 PM 06/11/2021    3:00 PM  CMP  Glucose 70 - 99 mg/dL 284  132  440   BUN 6 - 20 mg/dL 18  20  20    Creatinine 0.61 - 1.24 mg/dL 1.02  7.25  3.66   Sodium 135 - 145 mmol/L 138  138  137   Potassium 3.5 - 5.1 mmol/L 4.4  4.5  4.5   Chloride 98 - 111 mmol/L 102  105  103   CO2 22 - 32 mmol/L 26  24  25    Calcium 8.9 - 10.3 mg/dL 9.1  9.0  9.1      No results found for: "CEA1", "CEA" / No results found for: "CEA1", "CEA" Lab Results  Component Value Date   PSA1 0.3 11/25/2018   No results found for: "YQI347" No results found for: "CAN125"  No results found for: "TOTALPROTELP", "ALBUMINELP", "A1GS", "A2GS", "BETS", "BETA2SER", "GAMS", "MSPIKE", "SPEI" Lab Results  Component Value Date   TIBC 462 (H) 01/29/2020   TIBC 509 (H) 03/24/2019   TIBC 545 (H) 02/13/2019   FERRITIN 12 (L)  01/29/2020   FERRITIN 4 (L) 03/24/2019   FERRITIN 4 (L) 02/13/2019   IRONPCTSAT 25 01/29/2020   IRONPCTSAT 5 (L) 03/24/2019   IRONPCTSAT 4 (L) 02/13/2019   No results found for: "LDH"   STUDIES:   No results found.

## 2022-12-14 ENCOUNTER — Inpatient Hospital Stay: Payer: BC Managed Care – PPO

## 2022-12-14 ENCOUNTER — Inpatient Hospital Stay: Payer: BC Managed Care – PPO | Attending: Hematology | Admitting: Hematology

## 2022-12-14 ENCOUNTER — Ambulatory Visit (INDEPENDENT_AMBULATORY_CARE_PROVIDER_SITE_OTHER): Payer: BC Managed Care – PPO | Admitting: Gastroenterology

## 2022-12-14 VITALS — BP 135/66 | HR 59 | Temp 98.0°F | Resp 18 | Ht 73.0 in | Wt 249.4 lb

## 2022-12-14 DIAGNOSIS — R911 Solitary pulmonary nodule: Secondary | ICD-10-CM | POA: Insufficient documentation

## 2022-12-14 DIAGNOSIS — Z87891 Personal history of nicotine dependence: Secondary | ICD-10-CM | POA: Diagnosis not present

## 2022-12-14 DIAGNOSIS — D3A092 Benign carcinoid tumor of the stomach: Secondary | ICD-10-CM | POA: Insufficient documentation

## 2022-12-14 DIAGNOSIS — Z79899 Other long term (current) drug therapy: Secondary | ICD-10-CM | POA: Diagnosis not present

## 2022-12-14 DIAGNOSIS — I4819 Other persistent atrial fibrillation: Secondary | ICD-10-CM

## 2022-12-14 DIAGNOSIS — I10 Essential (primary) hypertension: Secondary | ICD-10-CM

## 2022-12-14 LAB — BASIC METABOLIC PANEL
Anion gap: 10 (ref 5–15)
BUN: 18 mg/dL (ref 6–20)
CO2: 26 mmol/L (ref 22–32)
Calcium: 9.1 mg/dL (ref 8.9–10.3)
Chloride: 102 mmol/L (ref 98–111)
Creatinine, Ser: 1.06 mg/dL (ref 0.61–1.24)
GFR, Estimated: >60 mL/min (ref >60)
Glucose, Bld: 142 mg/dL — ABNORMAL HIGH (ref 70–99)
Potassium: 4.4 mmol/L (ref 3.5–5.1)
Sodium: 138 mmol/L (ref 135–145)

## 2022-12-14 LAB — MAGNESIUM: Magnesium: 1.6 mg/dL — ABNORMAL LOW (ref 1.7–2.4)

## 2022-12-14 LAB — PSA: Prostatic Specific Antigen: 0.36 ng/mL (ref 0.00–4.00)

## 2022-12-14 NOTE — Patient Instructions (Addendum)
[facility]  Discharge Instructions  You were seen and examined today by Dr. Ellin Saba. Dr. Ellin Saba is a medical oncologist, meaning that he specializes in the treatment of cancer diagnoses. Dr. Ellin Saba discussed your past medical history, family history of cancers, and the events that led to you being here today.  You were referred to Dr. 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 This is a very slow growing type of cancer.  Dr. Ellin Saba has recommended a repeat endoscopy with Dr. Meridee Score.  Dr. Ellin Saba has also recommended additional labs today as well as a 24 hour urine test.  You will also have a CT scan of your chest to check on the indeterminate lung nodule.  Follow-up as scheduled.  Thank you for choosing [facility] to provide your oncology and hematology care.   To afford each patient quality time with our provider, please arrive at least 15 minutes before your scheduled appointment time. You may need to reschedule your appointment if you arrive late (10 or more minutes). Arriving late affects you and other patients whose appointments are after yours.  Also, if you miss three or more appointments without notifying the office, you may be dismissed from the clinic at the provider's discretion.    Again, thank you for choosing [facility].  Our hope is that these requests will decrease the amount of time that you wait before being seen by our physicians.   If you have a lab appointment with the Cancer Center - please note that after April 8th, all labs will be drawn in the cancer center.  You do not have to check in or register with the main entrance as you have in the past but will complete your check-in at the cancer center.            _____________________________________________________________  Should you have questions after your visit to [facility], please contact our office at [phone] and follow the prompts.  Our office hours are 8:00 a.m. to 4:30 p.m. Monday - Thursday and 8:00 a.m. to 2:30 p.m. Friday.  Please note that voicemails left after 4:00 p.m. may not be returned until the following business day.  We are closed weekends and all major holidays.  You do have access to a nurse 24-7, just call the main number to the clinic [phone] and do not press any options, hold on the line and a nurse will answer the phone.    For prescription refill requests, have your pharmacy contact our office and allow 72 hours.    Masks are no longer required in the cancer centers. If you would like for your care team to wear a mask while they are taking care of you, please let them know. You may have one support person who is at least 59 years old accompany you for your appointments.

## 2022-12-15 LAB — GASTRIN: Gastrin: 664 pg/mL — ABNORMAL HIGH (ref 0–115)

## 2022-12-16 LAB — CHROMOGRANIN A: Chromogranin A (ng/mL): 1414 ng/mL — ABNORMAL HIGH (ref 0.0–101.8)

## 2022-12-19 ENCOUNTER — Other Ambulatory Visit: Payer: Self-pay | Admitting: Cardiovascular Disease

## 2022-12-21 ENCOUNTER — Encounter (INDEPENDENT_AMBULATORY_CARE_PROVIDER_SITE_OTHER): Payer: Self-pay

## 2022-12-21 ENCOUNTER — Telehealth: Payer: Self-pay

## 2022-12-21 NOTE — Telephone Encounter (Signed)
Attempted to reach patient to review magnesium usage, no answer. Left detailed message per DPR asking that he call back to confirm what he's taking at home.

## 2022-12-21 NOTE — Telephone Encounter (Signed)
Patient is returning call and states that he will be at work tomorrow until 4 p.m. and can take calls at that time and will get the information needed to give on the medication he is currently taking.

## 2022-12-21 NOTE — Telephone Encounter (Signed)
-----   Message from Maurice Small sent at 12/19/2022  7:58 AM EDT ----- It looks like he already has magnesium prescribed -- 400 mg of mag ox daily. Maybe we should just make sure he's taking it. If he is, he might need to take it twice a day (with food). ----- Message ----- From: Festus Holts, RN Sent: 12/18/2022   5:48 PM EDT To: Maurice Small, MD  I need an order please sir :-)  Thanks, Brayton Caves ----- Message ----- From: Maurice Small, MD Sent: 12/18/2022   4:04 PM EDT To: Festus Holts, RN  Looks like he could use magnesium supplementation. ----- Message ----- From: Leory Plowman, [facility] [place] Sent: 12/14/2022   9:36 AM EDT To: Maurice Small, MD

## 2022-12-22 ENCOUNTER — Encounter (INDEPENDENT_AMBULATORY_CARE_PROVIDER_SITE_OTHER): Payer: Self-pay

## 2022-12-22 ENCOUNTER — Ambulatory Visit (INDEPENDENT_AMBULATORY_CARE_PROVIDER_SITE_OTHER): Payer: BC Managed Care – PPO | Admitting: Gastroenterology

## 2022-12-22 NOTE — Telephone Encounter (Signed)
magnesium chloride (slowMag), and magnesium lactate and magnesium glycinate would have better bioavailability.  The sustain release tabs are better, but cost more.  Would probably need about 4 tablets daily (magnesium chloride)

## 2022-12-22 NOTE — Telephone Encounter (Signed)
PharmD comments sent to patient via MyChart as requested.

## 2022-12-22 NOTE — Telephone Encounter (Signed)
Returned call to patient who states that he is already taking Magnesium Oxide 400mg  twice daily without any missed doses. He states this is how he's always been and doesn't know why it's low. Advised that per [employer], the max daily dose is 800mg  daily, but I will ask PharmD team if there is a different magnesium formulation that is more readily absorbed. He would appreciate that and requests we send him a response via MyChart as this is the easiest way to communicate with him.

## 2022-12-23 ENCOUNTER — Other Ambulatory Visit: Payer: Self-pay

## 2022-12-23 ENCOUNTER — Telehealth: Payer: Self-pay

## 2022-12-23 DIAGNOSIS — R911 Solitary pulmonary nodule: Secondary | ICD-10-CM | POA: Diagnosis not present

## 2022-12-23 DIAGNOSIS — Z87891 Personal history of nicotine dependence: Secondary | ICD-10-CM | POA: Diagnosis not present

## 2022-12-23 DIAGNOSIS — D3A092 Benign carcinoid tumor of the stomach: Secondary | ICD-10-CM

## 2022-12-23 DIAGNOSIS — Z79899 Other long term (current) drug therapy: Secondary | ICD-10-CM | POA: Diagnosis not present

## 2022-12-23 NOTE — Telephone Encounter (Signed)
-----   Message from Nurse Mario Coronado P sent at 12/08/2022  3:47 PM EDT ----- Pt needs cardiac clearance and reschedule EUS when available.

## 2022-12-24 ENCOUNTER — Telehealth: Payer: Self-pay | Admitting: Internal Medicine

## 2022-12-24 NOTE — Telephone Encounter (Signed)
The pt was seen by Dr Nelly Laurence last week and was told cardiology has cleared  him for procedure. I have sent a form to Dr Nelly Laurence to complete giving the ok medically and for the ok to stop xarelto.  As soon as we get the response I will get the pt back on the schedule.

## 2022-12-24 NOTE — Telephone Encounter (Signed)
PT is calling to let us know that he got his cardiac clearance last week. Please advise.

## 2022-12-24 NOTE — Telephone Encounter (Signed)
Pt would like to know if he can schedule an Endoscopy or not. Please advise

## 2022-12-25 NOTE — Telephone Encounter (Signed)
Pt notified and verbalized understanding.

## 2022-12-28 NOTE — Telephone Encounter (Signed)
Waiting for clearance from cardiology

## 2022-12-29 ENCOUNTER — Telehealth: Payer: Self-pay | Admitting: Internal Medicine

## 2022-12-29 LAB — 5 HIAA, QUANTITATIVE, URINE, 24 HOUR
5-HIAA, Ur: 3.5 mg/L
5-HIAA,Quant.,24 Hr Urine: 5.4 mg/24 hr (ref 0.0–14.9)
Total Volume: 1550

## 2022-12-29 NOTE — Telephone Encounter (Signed)
EUS has been set up for 02/22/23 at 12 noon at [facility] with GM  Will call pt after clearance received

## 2022-12-29 NOTE — Telephone Encounter (Signed)
Called requesting office and obtained verbal clearance information      Pre-operative Risk Assessment    Patient Name: Austin Woods  DOB: [birth year] MRN: [id number]   DATE OF LAST VISIT: 12/11/22 WITH DR. Nelly Laurence   UPCOMING VISIT: 06/11/23 WITH DR. Nelly Laurence   Request for Surgical Clearance    Procedure:  Endoscopic ultrasound   Date of Surgery:  Clearance 02/22/23                                 Surgeon:  Dr. Huntley Estelle  Surgeon's Group or Practice Name:   Phone number:  [phone] Fax number:  [phone] ATT: Dr. Adam Phenix Mansouraty    Type of Clearance Requested:   - Pharmacy:  Hold Rivaroxaban (Xarelto) 2 days    Type of Anesthesia:  Moderate Anesthesia    Additional requests/questions:    [name]   12/29/2022, 4:29 PM

## 2022-12-29 NOTE — Telephone Encounter (Signed)
Follow Up:      Austin Woods is calling to check on the status of patient's clearance. They aree waiting on clearance to schedule pr procedure.l

## 2022-12-29 NOTE — Telephone Encounter (Signed)
I called and spoke with the office of Dr Nelly Laurence regarding the cardiac clearance. [facility] has taken a message and will send to the doc for an answer today.

## 2022-12-30 NOTE — Telephone Encounter (Signed)
Will route to pharm for input on anticoag.  Dr. Jenene Slicker gave OK to proceed in 9/19 ph note so plan to bundle once received.

## 2022-12-30 NOTE — Telephone Encounter (Signed)
EUS scheduled, pt instructed and medications reviewed.  Patient instructions mailed to home.  Patient to call with any questions or concerns.

## 2022-12-30 NOTE — Telephone Encounter (Signed)
     Primary Cardiologist: Marjo Bicker, MD  Chart reviewed as part of pre-operative protocol coverage. Given past medical history and time since last visit, based on ACC/AHA guidelines, Austin Woods would be at acceptable risk for the planned procedure without further cardiovascular testing.   Patient with diagnosis of afib on Xarelto for anticoagulation.     Procedure: endoscopic ultrasound Date of procedure: 02/22/23   CHA2DS2-VASc Score = 4  This indicates a 4.8% annual risk of stroke. The patient's score is based upon: CHF History: 1 HTN History: 1 Diabetes History: 1 Stroke History: 0 Vascular Disease History: 1 Age Score: 0 Gender Score: 0   CrCl 16mL/min using adjusted body weight Platelet count 397K   Per office protocol, patient can hold Xarelto for 2 days prior to procedure as requested.  I will route this recommendation to the requesting party via Epic fax function and remove from pre-op pool.  Please call with questions.  Thomasene Ripple. Langston Summerfield NP-C     12/30/2022, 8:23 AM [facility] [address] Office [phone] Fax [phone]

## 2022-12-30 NOTE — Telephone Encounter (Signed)
Patient with diagnosis of afib on Xarelto for anticoagulation.    Procedure: endoscopic ultrasound Date of procedure: 02/22/23  CHA2DS2-VASc Score = 4  This indicates a 4.8% annual risk of stroke. The patient's score is based upon: CHF History: 1 HTN History: 1 Diabetes History: 1 Stroke History: 0 Vascular Disease History: 1 Age Score: 0 Gender Score: 0   CrCl 26mL/min using adjusted body weight Platelet count 397K  Per office protocol, patient can hold Xarelto for 2 days prior to procedure as requested.    **This guidance is not considered finalized until pre-operative APP has relayed final recommendations.**

## 2023-01-01 ENCOUNTER — Other Ambulatory Visit (INDEPENDENT_AMBULATORY_CARE_PROVIDER_SITE_OTHER): Payer: Self-pay | Admitting: Gastroenterology

## 2023-01-07 ENCOUNTER — Other Ambulatory Visit (INDEPENDENT_AMBULATORY_CARE_PROVIDER_SITE_OTHER): Payer: Self-pay | Admitting: Gastroenterology

## 2023-01-15 ENCOUNTER — Ambulatory Visit (HOSPITAL_COMMUNITY)
Admission: RE | Admit: 2023-01-15 | Discharge: 2023-01-15 | Disposition: A | Discharge status: Home / Self Care | Payer: BC Managed Care – PPO | Source: Ambulatory Visit | Attending: Hematology | Admitting: Hematology

## 2023-01-15 DIAGNOSIS — D3A092 Benign carcinoid tumor of the stomach: Secondary | ICD-10-CM | POA: Diagnosis not present

## 2023-01-15 DIAGNOSIS — R911 Solitary pulmonary nodule: Secondary | ICD-10-CM | POA: Insufficient documentation

## 2023-01-15 DIAGNOSIS — I700 Atherosclerosis of aorta: Secondary | ICD-10-CM | POA: Diagnosis not present

## 2023-01-15 MED ORDER — IOHEXOL 300 MG/ML  SOLN
75.0000 mL | Freq: Once | INTRAMUSCULAR | Status: AC | PRN
  Administered 2023-01-15: 75 mL via INTRAVENOUS

## 2023-01-21 ENCOUNTER — Inpatient Hospital Stay: Payer: BC Managed Care – PPO | Attending: Hematology | Admitting: Hematology

## 2023-01-21 VITALS — BP 138/71 | HR 85 | Temp 97.8°F | Resp 18 | Ht 73.0 in | Wt 252.7 lb

## 2023-01-21 DIAGNOSIS — E119 Type 2 diabetes mellitus without complications: Secondary | ICD-10-CM | POA: Diagnosis not present

## 2023-01-21 DIAGNOSIS — Z79899 Other long term (current) drug therapy: Secondary | ICD-10-CM | POA: Diagnosis not present

## 2023-01-21 DIAGNOSIS — D3A092 Benign carcinoid tumor of the stomach: Secondary | ICD-10-CM | POA: Diagnosis not present

## 2023-01-21 DIAGNOSIS — K219 Gastro-esophageal reflux disease without esophagitis: Secondary | ICD-10-CM | POA: Diagnosis not present

## 2023-01-21 DIAGNOSIS — R5383 Other fatigue: Secondary | ICD-10-CM | POA: Diagnosis not present

## 2023-01-21 DIAGNOSIS — R7989 Other specified abnormal findings of blood chemistry: Secondary | ICD-10-CM | POA: Diagnosis not present

## 2023-01-21 NOTE — Patient Instructions (Signed)
[facility]  Discharge Instructions  You were seen and examined today by Dr. Ellin Saba.  Dr. Ellin Saba discussed your most recent lab work and CT scan which revealed that everything looks good and stable.  Follow-up as scheduled in 8 weeks.    Thank you for choosing [facility] to provide your oncology and hematology care.   To afford each patient quality time with our provider, please arrive at least 15 minutes before your scheduled appointment time. You may need to reschedule your appointment if you arrive late (10 or more minutes). Arriving late affects you and other patients whose appointments are after yours.  Also, if you miss three or more appointments without notifying the office, you may be dismissed from the clinic at the provider's discretion.    Again, thank you for choosing [facility].  Our hope is that these requests will decrease the amount of time that you wait before being seen by our physicians.   If you have a lab appointment with the Cancer Center - please note that after April 8th, all labs will be drawn in the cancer center.  You do not have to check in or register with the main entrance as you have in the past but will complete your check-in at the cancer center.            _____________________________________________________________  Should you have questions after your visit to [facility], please contact our office at [phone] and follow the prompts.  Our office hours are 8:00 a.m. to 4:30 p.m. Monday - Thursday and 8:00 a.m. to 2:30 p.m. Friday.  Please note that voicemails left after 4:00 p.m. may not be returned until the following business day.  We are closed weekends and all major holidays.  You do have access to a nurse 24-7, just call the main number to the clinic [phone] and do not press any options, hold on the line and a nurse will answer the phone.    For  prescription refill requests, have your pharmacy contact our office and allow 72 hours.    Masks are no longer required in the cancer centers. If you would like for your care team to wear a mask while they are taking care of you, please let them know. You may have one support person who is at least 59 years old accompany you for your appointments.

## 2023-01-21 NOTE — Progress Notes (Signed)
[facility] [address] [place] [address]   Clinic Day:  01/21/2023  Referring physician: Dion Saucier, PA-C  Patient Care Team: Antonietta Jewel as PCP - General (Physician Assistant) Marjo Bicker, MD as PCP - Cardiology (Cardiology) Mealor, Roberts Gaudy, MD as PCP - Electrophysiology (Cardiology) Doreatha Massed, MD as Medical Oncologist (Medical Oncology)   ASSESSMENT & PLAN:   Assessment:  1.  Gastric carcinoid tumor: - Has history of GERD for 20+ years.  Underwent Nissen's fundoplication 20 years ago at [facility]. - No B symptoms.  No flushing.  Has wheezing from asthma and uses Advair inhaler daily.  Reports soft stools for the past 1 year. - EGD and gastric nodule biopsy (04/16/2022): Well-differentiated neuroendocrine tumor.  Transitional type mucosa with mild PPI effect in the stomach.  Ki-67 3-10%.  Overall grade 1 well-differentiated neuroendocrine tumor. - EGD/EUS (07/16/2022): 3 5-7 mm submucosal nodules with no bleeding and no stigmata of recent bleeding were found in the anterior wall of the gastric body (between 50-52 cm from incisors).  Status post endoscopic mucosal resection.  No malignant lymph nodes were visualized in the paracardial, left gastric and gastrohepatic ligament and celiac region by EUS. 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  Stomach nodule #3 biopsy shows fundic gland polyp with linear and focal nodular neuroendocrine cell hyperplasia.  No malignancy identified.  Stomach biopsy also showed gastric antral and oxyntic mucosa with features of reactive gastropathy, negative for H. pylori.  Ki-67 was less than 3%.  Tumor cells positive for synaptophysin and CD56. - Gastrin level: 880 (08/28/2022), 423 (06/09/2022) - Chromogranin A (06/09/2022): 1228, 1602 on 08/28/2022. - Dotatate  PET scan (10/12/2022): Increased uptake within the wall of the proximal stomach just below the GE junction with SUV 8.9.  No abdominal pelvic lymphadenopathy.  Increased uptake in the prostate gland with SUV 8.7.  New indeterminate nodule within the right lung base without increased uptake measuring 1 cm.  2.  Social/family history: - Lives at home with his wife.  Works as a Engineer, petroleum at [employer].  Quit smoking 12 years ago.  Smoked half pack per day for 3 years. - No family history of malignancy.   Plan:  1.  Gastric carcinoid: - We reviewed results of 24-hour urine which showed normal 5-HIAA. - Chromogranin levels improved to 1414.  Gastrin level also improved to 664. - CT chest on 01/15/2023: Right lower lobe pulmonary nodule has resolved. - He will proceed with EUS and biopsy of the proximal gastric lesion on 02/22/2023 by Dr. Meridee Score. - RTC 8 weeks to discuss the pathology.   No orders of the defined types were placed in this encounter.     Alben Deeds Teague,acting as a Neurosurgeon for Doreatha Massed, MD.,have documented all relevant documentation on the behalf of Doreatha Massed, MD,as directed by  Doreatha Massed, MD while in the presence of Doreatha Massed, MD.   I, Doreatha Massed MD, have reviewed the above documentation for accuracy and completeness, and I agree with the above.    Doreatha Massed, MD   10/17/20247:00 PM  CHIEF COMPLAINT/PURPOSE OF CONSULT:   Diagnosis: well differentiated neuroendocrine tumor   Cancer Staging  No matching staging information was found for the patient.    Prior Therapy: Endoscopic mucosal resection  Current Therapy: Surveillance   HISTORY OF PRESENT ILLNESS:  Oncology History   No history exists.      Austin Woods is a 57 y.o. male presenting to clinic today for evaluation of grade 1 NET at the request of Dr. Meridee Score.  He has been followed intermittently for a history of GERD. His last EGD  from 02/2020 showed only erosive gastropathy. He returned for follow up in 03/2022 with worsening symptoms, including frequent regurgitation, heartburn, cough, and a tight feeling in his abdomen. He underwent a repeat EGD on 04/16/22 under Dr. Levon Hedger and was incidentally found to have a 5 mm mucosal papule on anterior stomach wall. Pathology from the nodule revealed well-differentiated neuroendocrine tumor, with a Ki-67 of 3-10%. A staging CT A/P from 05/20/22 showed no evidence of intra-abdominal or pelvic pathology or metastatic disease.  He was taken for EGD with EUS on 07/16/22 under Dr. Meridee Score. EGD showed three submucosal papules in anterior portion of body of stomach, which were completely removed. EUS showed no evidence of significant subepithelial/submucosal lesions or malignant-appearing lymph nodes. Pathology from the nodules showed grade 1 NET to only one of the nodules. A second showed only focal nodular neuroendocrine cell hyperplasia.  He underwent staging DOTATATE PET scan on 10/12/22 showing: nonspecific increased radiotracer uptake within wall of proximal stomach just below GE junction; no signs of tracer-avid nodal or solid organ metastasis; new indeterminate 1 cm nodule within right lung base without corresponding increased tracer uptake; increased radiotracer activity within prostate gland.  He was scheduled for repeat EUS on 12/03/22, but the procedure was cancelled in order to obtain cardiac clearance.  Today, he states that he is doing well overall. His appetite level is at 100%. His energy level is at 40%.  INTERVAL HISTORY:   Austin Woods is a 57 y.o. male presenting to the clinic today for follow-up of well differentiated NET. He was last seen by me on 12/14/22 in consultation.  Since his last visit, he underwent CT chest on 01/15/23 that found: no evidence of metastatic disease within the chest; right lower lobe pulmonary nodule seen on previous PET-CT has resolved, likely  inflammatory; and severe coronary artery atherosclerosis.  Today, he states that he is doing well overall. His appetite level is at 100%. His energy level is at 40%.   Of note, he has an EUS scheduled on 02/22/23 with Dr. Meridee Score.   PAST MEDICAL HISTORY:   Past Medical History: Past Medical History:  Diagnosis Date   A-fib (HCC)    Allergy    All my life   Anemia    iron def anemia   Arthritis    Asthma    Coronary artery disease    Dysrhythmia    in and out Afib   GERD (gastroesophageal reflux disease)    Headache    High cholesterol    History of DVT (deep vein thrombosis)    a. left leg following ablation for SVT   Hypertension    Persistent atrial fibrillation (HCC)    a. Dx 08/2012, xarelto initiated; b. 09/2012 Tikosyn initiated -> DCCV -> Sinus c. PVI 03/2013 d. 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   Type 2 diabetes mellitus without complication, without long-term current use of insulin (HCC) 02/16/2018   Wolff-Parkinson-White (WPW) syndrome    resolved s/p RFCA 2001 by Dr Graciela Husbands    Surgical History: Past Surgical History:  Procedure Laterality Date   ATRIAL FIBRILLATION ABLATION  03/23/2013   PVI by  DR Johney Frame for afib   ATRIAL FIBRILLATION ABLATION N/A 03/23/2013   Procedure: ATRIAL FIBRILLATION ABLATION;  Surgeon: Gardiner Rhyme, MD;  Location: [facility] CATH LAB;  Service: Cardiovascular;  Laterality: N/A;   ATRIAL FIBRILLATION ABLATION N/A 03/27/2014   PVI Dr Allyne Gee SURGERY     BIOPSY  02/28/2020   Procedure: BIOPSY;  Surgeon: Malissa Hippo, MD;  Location: [facility] ENDO SUITE;  Service: Endoscopy;;  antral(pre-pyloric)    BIOPSY  04/16/2022   Procedure: BIOPSY;  Surgeon: Dolores Frame, MD;  Location: [facility] ENDO [facility];  Service: Gastroenterology;;   BIOPSY  07/16/2022   Procedure: BIOPSY;  Surgeon: Lemar Lofty., MD;  Location: [facility] ENDOSCOPY;  Service: Gastroenterology;;   CARDIAC CATHETERIZATION N/A  02/03/2016   Procedure: Left Heart Cath and Coronary Angiography;  Surgeon: Yvonne Kendall, MD;  Location: [facility] INVASIVE CV LAB;  Service: Cardiovascular;  Laterality: N/A;   CARDIAC CATHETERIZATION N/A 02/03/2016   Procedure: Intravascular Pressure Wire/FFR Study;  Surgeon: Yvonne Kendall, MD;  Location: [facility] INVASIVE CV LAB;  Service: Cardiovascular;  Laterality: N/A;   CARDIAC CATHETERIZATION N/A 02/03/2016   Procedure: Coronary Stent Intervention;  Surgeon: Yvonne Kendall, MD;  Location: [facility] INVASIVE CV LAB;  Service: Cardiovascular;  Laterality: N/A;   CARDIAC SURGERY     CARDIOVERSION N/A 08/25/2012   Procedure: CARDIOVERSION;  Surgeon: Vesta Mixer, MD;  Location: [facility] ENDOSCOPY;  Service: Cardiovascular;  Laterality: N/A;   CARDIOVERSION N/A 09/06/2012   Procedure: CARDIOVERSION/Bedside;  Surgeon: Luis Abed, MD;  Location: [facility] OR;  Service: Cardiovascular;  Laterality: N/A;   CARDIOVERSION N/A 01/12/2014   Procedure: CARDIOVERSION;  Surgeon: Chrystie Nose, MD;  Location: [facility] ENDOSCOPY;  Service: Cardiovascular;  Laterality: N/A;   CARDIOVERSION N/A 01/30/2016   Procedure: CARDIOVERSION;  Surgeon: Quintella Reichert, MD;  Location: [facility] ENDOSCOPY;  Service: Cardiovascular;  Laterality: N/A;   CARDIOVERSION N/A 04/14/2016   Procedure: CARDIOVERSION;  Surgeon: Chrystie Nose, MD;  Location: [facility] ENDOSCOPY;  Service: Cardiovascular;  Laterality: N/A;   COLONOSCOPY N/A 12/04/2015   Procedure: COLONOSCOPY;  Surgeon: Malissa Hippo, MD;  Location: [facility];  Service: Endoscopy;  Laterality: N/A;  730   COLONOSCOPY WITH PROPOFOL N/A 04/28/2019   Procedure: COLONOSCOPY WITH PROPOFOL;  Surgeon: Malissa Hippo, MD;  Location: [facility];  Service: Endoscopy;  Laterality: N/A;   CORONARY ANGIOPLASTY     CORONARY ANGIOPLASTY WITH STENT PLACEMENT     ELECTROPHYSIOLOGIC STUDY N/A 02/18/2016   Procedure: Atrial Fibrillation Ablation;  Surgeon: Hillis Range, MD;  Location: [facility] INVASIVE CV LAB;   Service: Cardiovascular;  Laterality: N/A;   ENDOSCOPIC MUCOSAL RESECTION N/A 07/16/2022   Procedure: ENDOSCOPIC MUCOSAL RESECTION;  Surgeon: Meridee Score Netty Starring., MD;  Location: [facility] ENDOSCOPY;  Service: Gastroenterology;  Laterality: N/A;   ESOPHAGOGASTRODUODENOSCOPY (EGD) WITH PROPOFOL N/A 04/28/2019   Procedure: ESOPHAGOGASTRODUODENOSCOPY (EGD) WITH PROPOFOL;  Surgeon: Malissa Hippo, MD;  [facility];  Service: Endoscopy;  Laterality: N/A;  7:30   ESOPHAGOGASTRODUODENOSCOPY (EGD) WITH PROPOFOL N/A 02/28/2020   Procedure: ESOPHAGOGASTRODUODENOSCOPY (EGD) WITH PROPOFOL;  Surgeon: Malissa Hippo, MD;  [facility]: [facility];  Service: Endoscopy;  Laterality: N/A;  1245   ESOPHAGOGASTRODUODENOSCOPY (EGD) WITH PROPOFOL N/A 04/16/2022   Procedure: ESOPHAGOGASTRODUODENOSCOPY (EGD) WITH PROPOFOL;  Surgeon: Dolores Frame, MD;  [facility]: [facility];  Service: Gastroenterology;  Laterality: N/A;  730am, asa 1-2   ESOPHAGOGASTRODUODENOSCOPY (EGD) WITH PROPOFOL N/A 07/16/2022   Procedure: ESOPHAGOGASTRODUODENOSCOPY (EGD) WITH PROPOFOL;  Surgeon: Meridee Score,  Netty Starring., MD;  Location: [facility] ENDOSCOPY;  Service: Gastroenterology;  Laterality: N/A;   EUS N/A 07/16/2022   Procedure: UPPER ENDOSCOPIC ULTRASOUND (EUS) RADIAL;  Surgeon: Lemar Lofty., MD;  Location: [facility] ENDOSCOPY;  Service: Gastroenterology;  Laterality: N/A;   HEMOSTASIS CLIP PLACEMENT  07/16/2022   Procedure: HEMOSTASIS CLIP PLACEMENT;  Surgeon: Lemar Lofty., MD;  Location: [facility] ENDOSCOPY;  Service: Gastroenterology;;   HERNIA REPAIR     10 years   implantable loop recorder removal  03/01/2019   MDT Reveal LINQ removed in office   INGUINAL HERNIA REPAIR Bilateral 06/04/2017   Procedure: OPEN BILATERAL INGUINAL HERNIA REPAIR;  Surgeon: Jimmye Norman, MD;  Location: [facility] OR;  Service: General;  Laterality: Bilateral;   LOOP RECORDER IMPLANT N/A 06/29/2014   Procedure: LOOP RECORDER IMPLANT;   Surgeon: Hillis Range, MD;  Location: [facility] CATH LAB;  Service: Cardiovascular;  Laterality: N/A;   NOSE SURGERY     sleep apnea surgery   PERIPHERAL VASCULAR CATHETERIZATION N/A 03/13/2016   Procedure: Thrombin Injection;  Surgeon: Nada Libman, MD;  Location: [facility] INVASIVE CV LAB;  Service: Cardiovascular;  Laterality: N/A;   POLYPECTOMY  04/28/2019   Procedure: POLYPECTOMY;  Surgeon: Malissa Hippo, MD;  Location: [facility];  Service: Endoscopy;;  duodenum;splenic flexure;   SPINE SURGERY     10 years agao   STOMACH SURGERY     reflux, fundoplication   SUBMUCOSAL LIFTING INJECTION  07/16/2022   Procedure: SUBMUCOSAL LIFTING INJECTION;  Surgeon: Lemar Lofty., MD;  Location: [facility] ENDOSCOPY;  Service: Gastroenterology;;   TEE WITHOUT CARDIOVERSION N/A 08/25/2012   Procedure: TRANSESOPHAGEAL ECHOCARDIOGRAM (TEE);  Surgeon: Vesta Mixer, MD;  Location: [facility] ENDOSCOPY;  Service: Cardiovascular;  Laterality: N/A;   TEE WITHOUT CARDIOVERSION  03/23/2013   DR ROSS   TEE WITHOUT CARDIOVERSION N/A 03/23/2013   Procedure: TRANSESOPHAGEAL ECHOCARDIOGRAM (TEE);  Surgeon: Pricilla Riffle, MD;  Location: [facility] ENDOSCOPY;  Service: Cardiovascular;  Laterality: N/A;   TEE WITHOUT CARDIOVERSION N/A 03/26/2014   Procedure: TRANSESOPHAGEAL ECHOCARDIOGRAM (TEE);  Surgeon: Wendall Stade, MD;  Location: [facility] ENDOSCOPY;  Service: Cardiovascular;  Laterality: N/A;   TONSILLECTOMY      Social History: Social History   Socioeconomic History   Marital status: Married    Spouse name: Not on file   Number of children: 2   Years of education: Not on file   Highest education level: Not on file  Occupational History    Employer: [employer]  Tobacco Use   Smoking status: Former    Current packs/day: 0.00    Average packs/day: 1 pack/day for 7.2 years (7.2 ttl pk-yrs)    Types: Cigarettes    Start date: 05/19/2003    Quit date: 08/11/2012    Years since quitting: 10.4   Smokeless tobacco: Never  Vaping  Use   Vaping status: Never Used  Substance and Sexual Activity   Alcohol use: No   Drug use: No   Sexual activity: Yes    Birth control/protection: None  Other Topics Concern   Not on file  Social History Narrative   Pt lives in [place] with wife.  Works at [employer]: Not on [employer] Insecurity: Not on file  Transportation Needs: Not on file  Physical Activity: Not on file  Stress: Not on file  Social Connections: Not on file  Intimate Partner Violence: Not on file    Family History: Family  History  Problem Relation Age of Onset   Diabetes Mother    Anemia Mother    Gout Mother    Kidney disease Mother    Obesity Mother    CAD Father 5   Diabetes Father    Heart attack Father    Transient ischemic attack Father    Vascular Disease Father    Heart disease Father    Hypertension Father    Obesity Father    Stroke Father    Diabetes Brother    Aneurysm Maternal Grandmother    Stroke Maternal Grandfather    Diabetes Paternal Grandmother    Stroke Paternal Grandfather     Current Medications:  Current Outpatient Medications:    albuterol (VENTOLIN HFA) 108 (90 Base) MCG/ACT inhaler, Inhale 2 puffs into the lungs every 6 (six) hours as needed for wheezing or shortness of breath., Disp: 8 g, Rfl: 0   atorvastatin (LIPITOR) 40 MG tablet, Take 1 tablet (40 mg total) by mouth daily., Disp: 90 tablet, Rfl: 3   azelastine (ASTELIN) 0.1 % nasal spray, Place 1 spray into both nostrils 2 (two) times daily as needed for rhinitis or allergies., Disp: , Rfl:    blood glucose meter kit and supplies, Dispense based on patient and insurance preference. Use to check blood sugar once a day(FOR ICD-10 E10.9, E11.9)., Disp: 1 each, Rfl: 0   Blood Glucose Monitoring Suppl (CONTOUR NEXT MONITOR) w/Device KIT, as directed per package instructions twice a day, Disp: , Rfl:    budesonide-formoterol (SYMBICORT) 160-4.5 MCG/ACT inhaler,  1 puff as needed Inhalation every 4 hrs, Disp: , Rfl:    cetirizine (ZYRTEC) 10 MG tablet, Take 10 mg by mouth daily., Disp: , Rfl:    Cholecalciferol (VITAMIN D) 2000 UNITS CAPS, Take 4,000 Units by mouth daily. , Disp: , Rfl:    dofetilide (TIKOSYN) 500 MCG capsule, TAKE 1 CAPSULE BY MOUTH TWICE  DAILY, Disp: 180 capsule, Rfl: 1   enalapril (VASOTEC) 10 MG tablet, Take 1 tablet (10 mg total) by mouth 2 (two) times daily., Disp: 180 tablet, Rfl: 3   fluticasone (FLONASE) 50 MCG/ACT nasal spray, Place 1 spray into both nostrils daily as needed for allergies. , Disp: , Rfl:    Fluticasone-Salmeterol (ADVAIR DISKUS) 250-50 MCG/DOSE AEPB, Inhale 1 puff into the lungs every 12 (twelve) hours., Disp: 60 each, Rfl: 0   furosemide (LASIX) 40 MG tablet, Take 1 tablet (40 mg total) by mouth as needed., Disp: , Rfl:    glucose blood (CONTOUR NEXT TEST) test strip, as directed per package instructions In Vitro twice a day, Disp: , Rfl:    hydrOXYzine (VISTARIL) 25 MG capsule, Take 2 capsules (50 mg total) by mouth 3 (three) times daily as needed for anxiety., Disp: 30 capsule, Rfl: 0   loratadine (CLARITIN) 10 MG tablet, Take 10 mg by mouth daily as needed for allergies. , Disp: , Rfl:    Magnesium Oxide 400 MG CAPS, Take one tablet by mouth twice daily (Patient taking differently: Take one tablet by mouth daily), Disp: 60 capsule, Rfl: 9   meloxicam (MOBIC) 15 MG tablet, Take 15 mg by mouth daily as needed for pain., Disp: , Rfl:    metFORMIN (GLUCOPHAGE) 1000 MG tablet, TAKE 1 TABLET BY MOUTH TWICE  DAILY WITH MEALS, Disp: 30 tablet, Rfl: 0   metoprolol tartrate (LOPRESSOR) 50 MG tablet, Take 50 mg by mouth daily., Disp: , Rfl:    nitroGLYCERIN (NITROSTAT) 0.4 MG SL tablet, DISSOLVE 1 TABLET UNDER  THE TONGUE EVERY 5 MINUTES AS  NEEDED FOR CHEST PAIN. MAX  OF 3 TABLETS IN 15 MINUTES. CALL 911 IF PAIN PERSISTS., Disp: 25 tablet, Rfl: 1   ondansetron (ZOFRAN) 4 MG tablet, TAKE 1 TABLET BY MOUTH EVERY 8 HOURS AS  NEEDED FOR NAUSEA AND VOMITING, Disp: 30 tablet, Rfl: 0   oxybutynin (DITROPAN) 5 MG tablet, TAKE ONE-HALF TABLET (2.5mg ) BY  MOUTH TWO TIMES DAILY AS  NEEDED FOR BLADDER SPASMS, Disp: 30 tablet, Rfl: 5   pantoprazole (PROTONIX) 40 MG tablet, Take 40 mg by mouth 2 (two) times daily., Disp: , Rfl:    potassium chloride SA (KLOR-CON) 20 MEQ tablet, TAKE 1 TABLET BY MOUTH  DAILY, Disp: 90 tablet, Rfl: 3   rivaroxaban (XARELTO) 20 MG TABS tablet, Take 1 tablet (20 mg total) by mouth daily with supper., Disp: 30 tablet, Rfl: 5   Simethicone 80 MG TABS, Take 1 tablet (80 mg total) by mouth every 6 (six) hours as needed., Disp: 60 tablet, Rfl: 1   vitamin B-12 (CYANOCOBALAMIN) 500 MCG tablet, Take 500 mcg by mouth daily., Disp: , Rfl:    Vonoprazan Fumarate (VOQUEZNA) 10 MG TABS, Take 10 mg by mouth daily at 6 (six) AM., Disp: 30 tablet, Rfl: 5   magnesium chloride (SLOW-MAG) 64 MG TBEC SR tablet, 2 tablets Orally Once a day, Disp: , Rfl:    Allergies: No Known Allergies  REVIEW OF SYSTEMS:   Review of Systems  Constitutional:  Negative for chills, fatigue and fever.  HENT:   Negative for lump/mass, mouth sores, nosebleeds, sore throat and trouble swallowing.   Eyes:  Negative for eye problems.  Respiratory:  Positive for shortness of breath. Negative for cough.   Cardiovascular:  Positive for chest pain and palpitations. Negative for leg swelling.  Gastrointestinal:  Positive for constipation, diarrhea and nausea. Negative for abdominal pain and vomiting.  Genitourinary:  Negative for bladder incontinence, difficulty urinating, dysuria, frequency, hematuria and nocturia.   Musculoskeletal:  Negative for arthralgias, back pain, flank pain, myalgias and neck pain.  Skin:  Negative for itching and rash.  Neurological:  Positive for headaches. Negative for dizziness and numbness.  Hematological:  Does not bruise/bleed easily.  Psychiatric/Behavioral:  Negative for depression, sleep disturbance and  suicidal ideas. The patient is not nervous/anxious.   All other systems reviewed and are negative.    VITALS:   Blood pressure 138/71, pulse 85, temperature 97.8 F (36.6 C), temperature source Oral, resp. rate 18, height 6\' 1"  (1.854 m), weight 252 lb 11.2 oz (114.6 kg), SpO2 99%.  Wt Readings from Last 3 Encounters:  01/21/23 252 lb 11.2 oz (114.6 kg)  12/14/22 249 lb 6.4 oz (113.1 kg)  12/11/22 252 lb 6.4 oz (114.5 kg)    Body mass index is 33.34 kg/m.  Performance status (ECOG): 0 - Asymptomatic  PHYSICAL EXAM:   Physical Exam Vitals and nursing note reviewed. Exam conducted with a chaperone present.  Constitutional:      Appearance: Normal appearance.  Cardiovascular:     Rate and Rhythm: Normal rate and regular rhythm.     Pulses: Normal pulses.     Heart sounds: Normal heart sounds.  Pulmonary:     Effort: Pulmonary effort is normal.     Breath sounds: Normal breath sounds.  Abdominal:     Palpations: Abdomen is soft. There is no hepatomegaly, splenomegaly or mass.     Tenderness: There is no abdominal tenderness.  Musculoskeletal:     Right lower leg:  No edema.     Left lower leg: No edema.  Lymphadenopathy:     Cervical: No cervical adenopathy.     Right cervical: No superficial, deep or posterior cervical adenopathy.    Left cervical: No superficial, deep or posterior cervical adenopathy.     Upper Body:     Right upper body: No supraclavicular or axillary adenopathy.     Left upper body: No supraclavicular or axillary adenopathy.  Neurological:     General: No focal deficit present.     Mental Status: He is alert and oriented to person, place, and time.  Psychiatric:        Mood and Affect: Mood normal.        Behavior: Behavior normal.     LABS:      Latest Ref Rng & Units 01/29/2020    9:30 AM 08/07/2019   12:50 PM 04/26/2019   10:15 AM  CBC  WBC 3.4 - 10.8 x10E3/uL 7.6  8.6  8.7   Hemoglobin 13.0 - 17.7 g/dL 16.1  09.6  04.5   Hematocrit 37.5  - 51.0 % 42.1  46.8  36.8   Platelets 150 - 450 x10E3/uL 357  285  275       Latest Ref Rng & Units 12/14/2022    9:07 AM 04/09/2022    3:24 PM 06/11/2021    3:00 PM  CMP  Glucose 70 - 99 mg/dL 409  811  914   BUN 6 - 20 mg/dL 18  20  20    Creatinine 0.61 - 1.24 mg/dL 7.82  9.56  2.13   Sodium 135 - 145 mmol/L 138  138  137   Potassium 3.5 - 5.1 mmol/L 4.4  4.5  4.5   Chloride 98 - 111 mmol/L 102  105  103   CO2 22 - 32 mmol/L 26  24  25    Calcium 8.9 - 10.3 mg/dL 9.1  9.0  9.1      No results found for: "CEA1", "CEA" / No results found for: "CEA1", "CEA" Lab Results  Component Value Date   PSA1 0.3 11/25/2018   No results found for: "YQM578" No results found for: "CAN125"  No results found for: "TOTALPROTELP", "ALBUMINELP", "A1GS", "A2GS", "BETS", "BETA2SER", "GAMS", "MSPIKE", "SPEI" Lab Results  Component Value Date   TIBC 462 (H) 01/29/2020   TIBC 509 (H) 03/24/2019   TIBC 545 (H) 02/13/2019   FERRITIN 12 (L) 01/29/2020   FERRITIN 4 (L) 03/24/2019   FERRITIN 4 (L) 02/13/2019   IRONPCTSAT 25 01/29/2020   IRONPCTSAT 5 (L) 03/24/2019   IRONPCTSAT 4 (L) 02/13/2019   No results found for: "LDH"   STUDIES:   CT Chest W Contrast  Result Date: 01/20/2023 CLINICAL DATA:  Follow up pulmonary nodule. History of gastric carcinoid tumor. * Tracking Code: BO * EXAM: CT CHEST WITH CONTRAST TECHNIQUE: Multidetector CT imaging of the chest was performed during intravenous contrast administration. RADIATION DOSE REDUCTION: This exam was performed according to the departmental dose-optimization program which includes automated exposure control, adjustment of the mA and/or kV according to patient size and/or use of iterative reconstruction technique. CONTRAST:  75mL OMNIPAQUE IOHEXOL 300 MG/ML  SOLN COMPARISON:  DOTATATE PET-CT 10/12/2022. CT abdomen and pelvis 05/20/2022 and chest CT 07/10/2016. FINDINGS: Cardiovascular: Severe coronary artery atherosclerosis with lesser involvement of the  aorta and great vessels. Probable calcifications of the aortic valve. No acute vascular findings are seen. The heart size is normal. There is no pericardial effusion. Mediastinum/Nodes: There  are no enlarged mediastinal, hilar or axillary lymph nodes. The thyroid gland, trachea and esophagus demonstrate no significant findings. Lungs/Pleura: No pleural effusion or pneumothorax. The lungs are clear. The right lower lobe nodule seen on previous PET-CT has resolved. Upper abdomen: Surgical clips are noted along the lesser curvature of the stomach. The visualized upper abdomen is otherwise unremarkable. Musculoskeletal/Chest wall: There is no chest wall mass or suspicious osseous finding. Unless specific follow-up recommendations are mentioned in the findings or impression sections, no imaging follow-up of any mentioned incidental findings is recommended. IMPRESSION: 1. No evidence of metastatic disease within the chest. The right lower lobe pulmonary nodule seen on previous PET-CT has resolved, likely inflammatory. 2. Severe coronary artery atherosclerosis. 3.  Aortic Atherosclerosis (ICD10-I70.0). Electronically Signed   By: Carey Bullocks M.D.   On: 01/20/2023 16:26

## 2023-01-26 ENCOUNTER — Telehealth: Payer: Self-pay

## 2023-01-26 NOTE — Telephone Encounter (Signed)
Patient informed that his FMLA for [employer] had been completed and faxed to the company. Fax confirmation received. Copy of documents mailed to patient as requested. No further concerns at this time.

## 2023-01-28 DIAGNOSIS — Z8679 Personal history of other diseases of the circulatory system: Secondary | ICD-10-CM | POA: Diagnosis not present

## 2023-01-28 DIAGNOSIS — Z23 Encounter for immunization: Secondary | ICD-10-CM | POA: Diagnosis not present

## 2023-01-28 DIAGNOSIS — K219 Gastro-esophageal reflux disease without esophagitis: Secondary | ICD-10-CM | POA: Diagnosis not present

## 2023-01-28 DIAGNOSIS — E119 Type 2 diabetes mellitus without complications: Secondary | ICD-10-CM | POA: Diagnosis not present

## 2023-01-29 ENCOUNTER — Telehealth: Payer: Self-pay

## 2023-01-29 DIAGNOSIS — Z01810 Encounter for preprocedural cardiovascular examination: Secondary | ICD-10-CM | POA: Insufficient documentation

## 2023-01-29 NOTE — Telephone Encounter (Signed)
-----   Message from Vishnu P Mallipeddi sent at 01/29/2023 11:13 AM EDT ----- Low risk, hold xarelto for 2-3 days prior to the procedure and resume when stable from bleeding standpoint. ----- Message ----- From: Loretha Stapler, RN Sent: 12/08/2022   3:46 PM EDT To: Marjo Bicker, MD

## 2023-01-29 NOTE — Telephone Encounter (Signed)
The pt has been advised of the ok to hold xarelto.

## 2023-02-02 ENCOUNTER — Other Ambulatory Visit (INDEPENDENT_AMBULATORY_CARE_PROVIDER_SITE_OTHER): Payer: Self-pay | Admitting: Gastroenterology

## 2023-02-08 ENCOUNTER — Other Ambulatory Visit (HOSPITAL_COMMUNITY): Payer: Self-pay | Admitting: Physician Assistant

## 2023-02-08 DIAGNOSIS — I4819 Other persistent atrial fibrillation: Secondary | ICD-10-CM

## 2023-02-09 ENCOUNTER — Other Ambulatory Visit: Payer: Self-pay

## 2023-02-09 DIAGNOSIS — I4819 Other persistent atrial fibrillation: Secondary | ICD-10-CM

## 2023-02-09 MED ORDER — RIVAROXABAN 20 MG PO TABS: 20.0000 mg | ORAL_TABLET | Freq: Every day | ORAL | 5 refills | Status: DC

## 2023-02-09 NOTE — Telephone Encounter (Signed)
Patient is asking for a refill.

## 2023-02-09 NOTE — Telephone Encounter (Signed)
Prescription refill request for Xarelto received.  Indication:afib Last office visit:9/24 Weight:114.6  kg Age:57 Scr:1.06  9/24 CrCl:121.63  ml/min  Prescription refilled

## 2023-02-15 ENCOUNTER — Encounter (HOSPITAL_COMMUNITY): Payer: Self-pay | Admitting: Gastroenterology

## 2023-02-15 NOTE — Progress Notes (Signed)
Attempted to obtain medical history for pre op call via telephone, unable to reach at this time. HIPAA compliant voicemail message left requesting return call to pre surgical testing department.

## 2023-02-22 ENCOUNTER — Ambulatory Visit (HOSPITAL_COMMUNITY): Payer: Self-pay

## 2023-02-22 ENCOUNTER — Encounter (HOSPITAL_COMMUNITY): Payer: Self-pay | Admitting: Gastroenterology

## 2023-02-22 ENCOUNTER — Ambulatory Visit (HOSPITAL_COMMUNITY)
Admission: RE | Admit: 2023-02-22 | Discharge: 2023-02-22 | Disposition: A | Discharge status: Home / Self Care | Payer: BC Managed Care – PPO | Attending: Gastroenterology | Admitting: Gastroenterology

## 2023-02-22 ENCOUNTER — Encounter (HOSPITAL_COMMUNITY)
Admission: RE | Disposition: A | Discharge status: Home / Self Care | Payer: Self-pay | Source: Home / Self Care | Attending: Gastroenterology

## 2023-02-22 ENCOUNTER — Other Ambulatory Visit: Payer: Self-pay

## 2023-02-22 DIAGNOSIS — Z7901 Long term (current) use of anticoagulants: Secondary | ICD-10-CM | POA: Insufficient documentation

## 2023-02-22 DIAGNOSIS — Z79899 Other long term (current) drug therapy: Secondary | ICD-10-CM | POA: Diagnosis not present

## 2023-02-22 DIAGNOSIS — J45909 Unspecified asthma, uncomplicated: Secondary | ICD-10-CM | POA: Diagnosis not present

## 2023-02-22 DIAGNOSIS — R935 Abnormal findings on diagnostic imaging of other abdominal regions, including retroperitoneum: Secondary | ICD-10-CM | POA: Diagnosis not present

## 2023-02-22 DIAGNOSIS — K2940 Chronic atrophic gastritis without bleeding: Secondary | ICD-10-CM | POA: Diagnosis not present

## 2023-02-22 DIAGNOSIS — I2510 Atherosclerotic heart disease of native coronary artery without angina pectoris: Secondary | ICD-10-CM | POA: Insufficient documentation

## 2023-02-22 DIAGNOSIS — K3189 Other diseases of stomach and duodenum: Secondary | ICD-10-CM | POA: Insufficient documentation

## 2023-02-22 DIAGNOSIS — Z7984 Long term (current) use of oral hypoglycemic drugs: Secondary | ICD-10-CM | POA: Diagnosis not present

## 2023-02-22 DIAGNOSIS — E119 Type 2 diabetes mellitus without complications: Secondary | ICD-10-CM | POA: Insufficient documentation

## 2023-02-22 DIAGNOSIS — K2970 Gastritis, unspecified, without bleeding: Secondary | ICD-10-CM | POA: Diagnosis not present

## 2023-02-22 DIAGNOSIS — K9189 Other postprocedural complications and disorders of digestive system: Secondary | ICD-10-CM | POA: Insufficient documentation

## 2023-02-22 DIAGNOSIS — Q399 Congenital malformation of esophagus, unspecified: Secondary | ICD-10-CM | POA: Diagnosis not present

## 2023-02-22 DIAGNOSIS — D3A8 Other benign neuroendocrine tumors: Secondary | ICD-10-CM

## 2023-02-22 DIAGNOSIS — K2950 Unspecified chronic gastritis without bleeding: Secondary | ICD-10-CM | POA: Insufficient documentation

## 2023-02-22 DIAGNOSIS — I509 Heart failure, unspecified: Secondary | ICD-10-CM | POA: Diagnosis not present

## 2023-02-22 DIAGNOSIS — G4730 Sleep apnea, unspecified: Secondary | ICD-10-CM | POA: Insufficient documentation

## 2023-02-22 DIAGNOSIS — I110 Hypertensive heart disease with heart failure: Secondary | ICD-10-CM | POA: Insufficient documentation

## 2023-02-22 DIAGNOSIS — I4819 Other persistent atrial fibrillation: Secondary | ICD-10-CM

## 2023-02-22 DIAGNOSIS — I4891 Unspecified atrial fibrillation: Secondary | ICD-10-CM | POA: Diagnosis not present

## 2023-02-22 DIAGNOSIS — I899 Noninfective disorder of lymphatic vessels and lymph nodes, unspecified: Secondary | ICD-10-CM | POA: Diagnosis not present

## 2023-02-22 DIAGNOSIS — Z87891 Personal history of nicotine dependence: Secondary | ICD-10-CM | POA: Insufficient documentation

## 2023-02-22 DIAGNOSIS — K317 Polyp of stomach and duodenum: Secondary | ICD-10-CM | POA: Diagnosis not present

## 2023-02-22 DIAGNOSIS — K319 Disease of stomach and duodenum, unspecified: Secondary | ICD-10-CM | POA: Diagnosis not present

## 2023-02-22 HISTORY — PX: ENDOSCOPIC MUCOSAL RESECTION: SHX6839

## 2023-02-22 HISTORY — PX: ESOPHAGOGASTRODUODENOSCOPY: SHX5428

## 2023-02-22 HISTORY — PX: BIOPSY: SHX5522

## 2023-02-22 HISTORY — PX: EUS: SHX5427

## 2023-02-22 HISTORY — PX: HEMOSTASIS CLIP PLACEMENT: SHX6857

## 2023-02-22 HISTORY — PX: SUBMUCOSAL LIFTING INJECTION: SHX6855

## 2023-02-22 LAB — GLUCOSE, CAPILLARY: Glucose-Capillary: 122 mg/dL — ABNORMAL HIGH (ref 70–99)

## 2023-02-22 SURGERY — UPPER ENDOSCOPIC ULTRASOUND (EUS) RADIAL
Anesthesia: Monitor Anesthesia Care

## 2023-02-22 MED ORDER — LIDOCAINE 2% (20 MG/ML) 5 ML SYRINGE
INTRAMUSCULAR | Status: DC | PRN
  Administered 2023-02-22 (×2): 100 mg via INTRAVENOUS

## 2023-02-22 MED ORDER — PROPOFOL 10 MG/ML IV BOLUS
INTRAVENOUS | Status: DC | PRN
  Administered 2023-02-22 (×6): 20 mg via INTRAVENOUS

## 2023-02-22 MED ORDER — SODIUM CHLORIDE 0.9 % IV SOLN: INTRAVENOUS | Status: DC | PRN

## 2023-02-22 MED ORDER — PROPOFOL 1000 MG/100ML IV EMUL
INTRAVENOUS | Status: AC
  Filled 2023-02-22: qty 100

## 2023-02-22 MED ORDER — PROPOFOL 500 MG/50ML IV EMUL
INTRAVENOUS | Status: DC | PRN
  Administered 2023-02-22: 125 ug/kg/min via INTRAVENOUS

## 2023-02-22 MED ORDER — PROPOFOL 500 MG/50ML IV EMUL
INTRAVENOUS | Status: AC
Start: 2023-02-22 — End: ?
  Filled 2023-02-22: qty 50

## 2023-02-22 MED ORDER — RIVAROXABAN 20 MG PO TABS: 20.0000 mg | ORAL_TABLET | Freq: Every day | ORAL | Status: DC

## 2023-02-22 MED ORDER — SUCRALFATE 1 G PO TABS: 1.0000 g | ORAL_TABLET | Freq: Two times a day (BID) | ORAL | 0 refills | Status: DC

## 2023-02-22 NOTE — Anesthesia Preprocedure Evaluation (Signed)
Anesthesia Evaluation  Patient identified by MRN, date of birth, ID band Patient awake    Reviewed: Allergy & Precautions, NPO status , Patient's Chart, lab work & pertinent test results, reviewed documented beta blocker date and time   Airway Mallampati: II  TM Distance: >3 FB Neck ROM: Full    Dental no notable dental hx. (+) Teeth Intact, Dental Advisory Given   Pulmonary asthma , sleep apnea , former smoker   Pulmonary exam normal breath sounds clear to auscultation       Cardiovascular hypertension, Pt. on home beta blockers and Pt. on medications + CAD, +CHF and + DVT  Normal cardiovascular exam+ dysrhythmias Atrial Fibrillation  Rhythm:Regular Rate:Normal     Neuro/Psych  Headaches    GI/Hepatic Neg liver ROS,GERD  ,,gastric neuroendocrine tumor   Endo/Other  diabetes, Type 2, Oral Hypoglycemic Agents  Class 3 obesity  Renal/GU negative Renal ROS  negative genitourinary   Musculoskeletal  (+) Arthritis ,    Abdominal  (+) + obese  Peds  Hematology  (+) Blood dyscrasia (Xarelto)   Anesthesia Other Findings Day of surgery medications reviewed with the patient.  Reproductive/Obstetrics                             Anesthesia Physical Anesthesia Plan  ASA: 3  Anesthesia Plan: MAC   Post-op Pain Management:    Induction: Intravenous  PONV Risk Score and Plan: 1 and TIVA, Treatment may vary due to age or medical condition and Propofol infusion  Airway Management Planned: Natural Airway and Mask  Additional Equipment: None  Intra-op Plan:   Post-operative Plan:   Informed Consent: I have reviewed the patients History and Physical, chart, labs and discussed the procedure including the risks, benefits and alternatives for the proposed anesthesia with the patient or authorized representative who has indicated his/her understanding and acceptance.     Dental advisory  given  Plan Discussed with: CRNA  Anesthesia Plan Comments:        Anesthesia Quick Evaluation

## 2023-02-22 NOTE — H&P (Signed)
GASTROENTEROLOGY PROCEDURE H&P NOTE   Primary Care Physician: Dion Saucier, PA-C  HPI: Austin Woods is a 57 y.o. male who presents for Upper EUS for evaluation and history of gastric NET and abnormal DOTATATE scan.  Past Medical History:  Diagnosis Date   A-fib [facility])    Allergy    All my life   Anemia    iron def anemia   Arthritis    Asthma    Coronary artery disease    Dysrhythmia    in and out Afib   GERD (gastroesophageal reflux disease)    Headache    High cholesterol    History of DVT (deep vein thrombosis)    a. left leg following ablation for SVT   Hypertension    Persistent atrial fibrillation (HCC)    a. Dx 08/2012, xarelto initiated; b. 09/2012 Tikosyn initiated -> DCCV -> Sinus c. PVI 03/2013 d. 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   Type 2 diabetes mellitus without complication, without long-term current use of insulin (HCC) 02/16/2018   Wolff-Parkinson-White (WPW) syndrome    resolved s/p RFCA 2001 by Dr Graciela Husbands   Past Surgical History:  Procedure Laterality Date   ATRIAL FIBRILLATION ABLATION  03/23/2013   PVI by DR Johney Frame for afib   ATRIAL FIBRILLATION ABLATION N/A 03/23/2013   Procedure: ATRIAL FIBRILLATION ABLATION;  Surgeon: Gardiner Rhyme, MD;  Location: [facility] CATH LAB;  Service: Cardiovascular;  Laterality: N/A;   ATRIAL FIBRILLATION ABLATION N/A 03/27/2014   PVI Dr Allyne Gee SURGERY     BIOPSY  02/28/2020   Procedure: BIOPSY;  Surgeon: Malissa Hippo, MD;  Location: [facility] ENDO [facility];  Service: Endoscopy;;  antral(pre-pyloric)    BIOPSY  04/16/2022   Procedure: BIOPSY;  Surgeon: Dolores Frame, MD;  [facility]: [facility];  Service: Gastroenterology;;   BIOPSY  07/16/2022   Procedure: BIOPSY;  Surgeon: Lemar Lofty., MD;  Location: [facility];  Service: Gastroenterology;;   CARDIAC CATHETERIZATION N/A 02/03/2016   Procedure: Left Heart Cath and Coronary Angiography;  Surgeon:  Yvonne Kendall, MD;  Location: [facility] INVASIVE CV LAB;  Service: Cardiovascular;  Laterality: N/A;   CARDIAC CATHETERIZATION N/A 02/03/2016   Procedure: Intravascular Pressure Wire/FFR Study;  Surgeon: Yvonne Kendall, MD;  Location: [facility] INVASIVE CV LAB;  Service: Cardiovascular;  Laterality: N/A;   CARDIAC CATHETERIZATION N/A 02/03/2016   Procedure: Coronary Stent Intervention;  Surgeon: Yvonne Kendall, MD;  Location: [facility] INVASIVE CV LAB;  Service: Cardiovascular;  Laterality: N/A;   CARDIAC SURGERY     CARDIOVERSION N/A 08/25/2012   Procedure: CARDIOVERSION;  Surgeon: Vesta Mixer, MD;  Location: [facility] ENDOSCOPY;  Service: Cardiovascular;  Laterality: N/A;   CARDIOVERSION N/A 09/06/2012   Procedure: CARDIOVERSION/Bedside;  Surgeon: Luis Abed, MD;  Location: [facility] OR;  Service: Cardiovascular;  Laterality: N/A;   CARDIOVERSION N/A 01/12/2014   Procedure: CARDIOVERSION;  Surgeon: Chrystie Nose, MD;  Location: [facility] ENDOSCOPY;  Service: Cardiovascular;  Laterality: N/A;   CARDIOVERSION N/A 01/30/2016   Procedure: CARDIOVERSION;  Surgeon: Quintella Reichert, MD;  Location: [facility] ENDOSCOPY;  Service: Cardiovascular;  Laterality: N/A;   CARDIOVERSION N/A 04/14/2016   Procedure: CARDIOVERSION;  Surgeon: Chrystie Nose, MD;  Location: [facility] ENDOSCOPY;  Service: Cardiovascular;  Laterality: N/A;   COLONOSCOPY N/A 12/04/2015   Procedure: COLONOSCOPY;  Surgeon: Malissa Hippo, MD;  [facility];  Service: Endoscopy;  Laterality: N/A;  730   COLONOSCOPY WITH PROPOFOL N/A 04/28/2019   Procedure: COLONOSCOPY WITH PROPOFOL;  Surgeon: Malissa Hippo, MD;  [facility];  Service: Endoscopy;  Laterality: N/A;   CORONARY ANGIOPLASTY     CORONARY ANGIOPLASTY WITH STENT PLACEMENT     ELECTROPHYSIOLOGIC STUDY N/A 02/18/2016   Procedure: Atrial Fibrillation Ablation;  Surgeon: Hillis Range, MD;  Location: [facility] INVASIVE CV LAB;  Service: Cardiovascular;  Laterality: N/A;   ENDOSCOPIC MUCOSAL RESECTION  N/A 07/16/2022   Procedure: ENDOSCOPIC MUCOSAL RESECTION;  Surgeon: Meridee Score Netty Starring., MD;  Location: [facility] ENDOSCOPY;  Service: Gastroenterology;  Laterality: N/A;   ESOPHAGOGASTRODUODENOSCOPY (EGD) WITH PROPOFOL N/A 04/28/2019   Procedure: ESOPHAGOGASTRODUODENOSCOPY (EGD) WITH PROPOFOL;  Surgeon: Malissa Hippo, MD;  [facility];  Service: Endoscopy;  Laterality: N/A;  7:30   ESOPHAGOGASTRODUODENOSCOPY (EGD) WITH PROPOFOL N/A 02/28/2020   Procedure: ESOPHAGOGASTRODUODENOSCOPY (EGD) WITH PROPOFOL;  Surgeon: Malissa Hippo, MD;  [facility];  Service: Endoscopy;  Laterality: N/A;  1245   ESOPHAGOGASTRODUODENOSCOPY (EGD) WITH PROPOFOL N/A 04/16/2022   Procedure: ESOPHAGOGASTRODUODENOSCOPY (EGD) WITH PROPOFOL;  Surgeon: Dolores Frame, MD;  [facility];  Service: Gastroenterology;  Laterality: N/A;  730am, asa 1-2   ESOPHAGOGASTRODUODENOSCOPY (EGD) WITH PROPOFOL N/A 07/16/2022   Procedure: ESOPHAGOGASTRODUODENOSCOPY (EGD) WITH PROPOFOL;  Surgeon: Meridee Score Netty Starring., MD;  Location: [facility] ENDOSCOPY;  Service: Gastroenterology;  Laterality: N/A;   EUS N/A 07/16/2022   Procedure: UPPER ENDOSCOPIC ULTRASOUND (EUS) RADIAL;  Surgeon: Lemar Lofty., MD;  Location: [facility] ENDOSCOPY;  Service: Gastroenterology;  Laterality: N/A;   HEMOSTASIS CLIP PLACEMENT  07/16/2022   Procedure: HEMOSTASIS CLIP PLACEMENT;  Surgeon: Lemar Lofty., MD;  Location: [facility] ENDOSCOPY;  Service: Gastroenterology;;   HERNIA REPAIR     10 years   implantable loop recorder removal  03/01/2019   MDT Reveal LINQ removed in office   INGUINAL HERNIA REPAIR Bilateral 06/04/2017   Procedure: OPEN BILATERAL INGUINAL HERNIA REPAIR;  Surgeon: Jimmye Norman, MD;  Location: [facility] OR;  Service: General;  Laterality: Bilateral;   LOOP RECORDER IMPLANT N/A 06/29/2014   Procedure: LOOP RECORDER IMPLANT;  Surgeon: Hillis Range, MD;  Location: [facility] CATH LAB;  Service: Cardiovascular;   Laterality: N/A;   NOSE SURGERY     sleep apnea surgery   PERIPHERAL VASCULAR CATHETERIZATION N/A 03/13/2016   Procedure: Thrombin Injection;  Surgeon: Nada Libman, MD;  Location: [facility] INVASIVE CV LAB;  Service: Cardiovascular;  Laterality: N/A;   POLYPECTOMY  04/28/2019   Procedure: POLYPECTOMY;  Surgeon: Malissa Hippo, MD;  [facility];  Service: Endoscopy;;  duodenum;splenic flexure;   SPINE SURGERY     10 years agao   STOMACH SURGERY     reflux, fundoplication   SUBMUCOSAL LIFTING INJECTION  07/16/2022   Procedure: SUBMUCOSAL LIFTING INJECTION;  Surgeon: Lemar Lofty., MD;  Location: [facility] ENDOSCOPY;  Service: Gastroenterology;;   TEE WITHOUT CARDIOVERSION N/A 08/25/2012   Procedure: TRANSESOPHAGEAL ECHOCARDIOGRAM (TEE);  Surgeon: Vesta Mixer, MD;  Location: [facility] ENDOSCOPY;  Service: Cardiovascular;  Laterality: N/A;   TEE WITHOUT CARDIOVERSION  03/23/2013   DR ROSS   TEE WITHOUT CARDIOVERSION N/A 03/23/2013   Procedure: TRANSESOPHAGEAL ECHOCARDIOGRAM (TEE);  Surgeon: Pricilla Riffle, MD;  Location: [facility] ENDOSCOPY;  Service: Cardiovascular;  Laterality: N/A;   TEE WITHOUT CARDIOVERSION N/A 03/26/2014   Procedure: TRANSESOPHAGEAL ECHOCARDIOGRAM (TEE);  Surgeon: Wendall Stade, MD;  Location: [facility] ENDOSCOPY;  Service: Cardiovascular;  Laterality: N/A;   TONSILLECTOMY  No current facility-administered medications for this encounter.   No current facility-administered medications for this encounter. No Known Allergies Family History  Problem Relation Age of Onset   Diabetes Mother    Anemia Mother    Gout Mother    Kidney disease Mother    Obesity Mother    CAD Father 43   Diabetes Father    Heart attack Father    Transient ischemic attack Father    Vascular Disease Father    Heart disease Father    Hypertension Father    Obesity Father    Stroke Father    Diabetes Brother    Aneurysm Maternal Grandmother    Stroke Maternal Grandfather    Diabetes  Paternal Grandmother    Stroke Paternal Grandfather    Social History   Socioeconomic History   Marital status: Married    Spouse name: Not on file   Number of children: 2   Years of education: Not on file   Highest education level: Not on file  Occupational History    Employer: [employer]  Tobacco Use   Smoking status: Former    Current packs/day: 0.00    Average packs/day: 1 pack/day for 7.2 years (7.2 ttl pk-yrs)    Types: Cigarettes    Start date: 05/19/2003    Quit date: 08/11/2012    Years since quitting: 10.5   Smokeless tobacco: Never  Vaping Use   Vaping status: Never Used  Substance and Sexual Activity   Alcohol use: No   Drug use: No   Sexual activity: Yes    Birth control/protection: None  Other Topics Concern   Not on file  Social History Narrative   Pt lives in [place] with wife.  Works at [employer]: Not on [employer] Insecurity: Not on file  Transportation Needs: Not on file  Physical Activity: Not on file  Stress: Not on file  Social Connections: Not on file  Intimate Partner Violence: Not on file    Physical Exam: Today's Vitals   02/15/23 1634  Weight: 114 kg   Body mass index is 33.16 kg/m. GEN: NAD EYE: Sclerae anicteric ENT: MMM CV: Non-tachycardic GI: Soft, NT/ND NEURO:  Alert & Oriented x 3  Lab Results: No results for input(s): "WBC", "HGB", "HCT", "PLT" in the last 72 hours. BMET No results for input(s): "NA", "K", "CL", "CO2", "GLUCOSE", "BUN", "CREATININE", "CALCIUM" in the last 72 hours. LFT No results for input(s): "PROT", "ALBUMIN", "AST", "ALT", "ALKPHOS", "BILITOT", "BILIDIR", "IBILI" in the last 72 hours. PT/INR No results for input(s): "LABPROT", "INR" in the last 72 hours.   Impression / Plan: This is a 57 y.o.male who presents for Upper EUS for evaluation and history of gastric NET and abnormal DOTATATE scan.  The risks of an EUS including intestinal  perforation, bleeding, infection, aspiration, and medication effects were discussed as was the possibility it may not give a definitive diagnosis if a biopsy is performed.  When a biopsy of the pancreas is done as part of the EUS, there is an additional risk of pancreatitis at the rate of about 1-2%.  It was explained that procedure related pancreatitis is typically mild, although it can be severe and even life threatening, which is why we do not perform random pancreatic biopsies and only biopsy a lesion/area we feel is concerning enough to warrant the risk.  The risks and benefits of endoscopic evaluation/treatment were discussed with  the patient and/or family; these include but are not limited to the risk of perforation, infection, bleeding, missed lesions, lack of diagnosis, severe illness requiring hospitalization, as well as anesthesia and sedation related illnesses.  The patient's history has been reviewed, patient examined, no change in status, and deemed stable for procedure.  The patient and/or family is agreeable to proceed.    Corliss Parish, MD [facility] Advanced [facility]oscopy Office # [phone]

## 2023-02-22 NOTE — Op Note (Signed)
[facility] Patient Name: Austin Woods Procedure Date: 02/22/2023 MRN: [id number] Attending MD: Corliss Parish , MD, [id number] Date of Birth: [birth year] CSN: [id number] Age: 57 Admit Type: Outpatient Procedure:                Upper EUS Indications:              Gastric deformity on endoscopy/Subepithelial tumor                            versus extrinsic compression, Abnormal abdominal                            PET scan, Neuroendocrine Tumor Providers:                Corliss Parish, MD, Weston Settle, RN, Norman Clay, RN, Beryle Beams, Technician, Harrington Challenger,                            Technician, Leroy Libman, CRNA Referring MD:             Doreatha Massed, MD, Katrinka Blazing Medicines:                Monitored Anesthesia Care Complications:            No immediate complications. Estimated Blood Loss:     Estimated blood loss was minimal. Procedure:                Pre-Anesthesia Assessment:                           - Prior to the procedure, a History and Physical                            was performed, and patient medications and                            allergies were reviewed. The patient's tolerance of                            previous anesthesia was also reviewed. The risks                            and benefits of the procedure and the sedation                            options and risks were discussed with the patient.                            All questions were answered, and informed consent                            was obtained. Prior Anticoagulants: The patient has  taken Xarelto (rivaroxaban), last dose was 2 days                            prior to procedure. ASA Grade Assessment: III - A                            patient with severe systemic disease. After                            reviewing the risks and benefits, the patient was                            deemed in  satisfactory condition to undergo the                            procedure.                           After obtaining informed consent, the endoscope was                            passed under direct vision. Throughout the                            procedure, the patient's blood pressure, pulse, and                            oxygen saturations were monitored continuously. The                            [id number]-[id number] ([id number] Olympus therapeutic endoscope                            was introduced through the mouth, and advanced to                            the second part of duodenum. The [id number]-[id number]                            ([id number]) Olympus radial ultrasound scope was                            introduced through the mouth, and advanced to the                            stomach for ultrasound examination from the                            esophagus and stomach. The upper EUS was                            accomplished without difficulty. The patient  tolerated the procedure. Scope In: Scope Out: Findings:      ENDOSCOPIC FINDING: :      No gross mucosal lesions were noted in the entire esophagus.      The distal esophagus was moderately tortuous.      The Z-line was regular and was found 41 cm from the incisors.      Evidence of a prior Nissen fundoplication was found in the cardia. This       was characterized by a non-intact appearance.      Three 3 to 7 mm sessile polyps with no bleeding and no stigmata of       recent bleeding were found in the cardia, on the greater curvature of       the gastric body and on the posterior wall of the stomach. After the EUS       was completed, preparations were made for mucosal resection. Demarcation       of the lesion was performed with high-definition white light and narrow       band imaging to clearly identify the boundaries of the lesion. EverLift       was injected to raise the lesion. Band ligator and snare  mucosal       resection was performed. Resection and retrieval were complete. Resected       tissue margins were examined and clear of polyp tissue. To prevent       bleeding after mucosal resection, two hemostatic clips were successfully       placed (MR conditional). Clip manufacturer: [employer]. There was       no bleeding during, or at the end, of the procedure. Preparations were       made for mucosal resection. Demarcation of the lesion was performed with       high-definition white light and narrow band imaging to clearly identify       the boundaries of the lesion. EverLift was injected to raise the lesion.       Band ligator and snare mucosal resection was performed. Resection and       retrieval were complete. Resected tissue margins were examined and clear       of polyp tissue. To prevent bleeding after mucosal resection, two       hemostatic clips were successfully placed (MR conditional). Clip       manufacturer: [employer]. There was no bleeding during, or at the       end, of the procedure. Preparations were made for mucosal resection.       Demarcation of the lesion was performed with high-definition white light       and narrow band imaging to clearly identify the boundaries of the       lesion. EverLift was injected to raise the lesion. Band ligator and       snare mucosal resection was performed. Resection and retrieval were       complete. Resected tissue margins were examined and clear of polyp       tissue. To prevent bleeding after mucosal resection, three hemostatic       clips were successfully placed (MR conditional). Clip manufacturer:       [employer]. There was no bleeding at the end of the procedure.      Localized moderate inflammation characterized by congestion (edema) and       erosions was found in the gastric antrum.      An endoclip was  found on the anterior wall of the stomach.      Diffuse granular mucosa was found in the entire  examined stomach.       Biopsies were taken with a cold forceps for histology.      No gross lesions were noted in the duodenal bulb, in the first portion       of the duodenum and in the second portion of the duodenum.      ENDOSONOGRAPHIC FINDING: :      Endosonographic images of the stomach were unremarkable. No masses and       no wall thickening were identified.      Endosonographic imaging in the visualized portion of the liver showed no       mass.      No malignant-appearing lymph nodes were visualized in the celiac region       (level 20), peripancreatic region and porta hepatis region.      The celiac region was visualized. Impression:               EGD Impression:                           - No gross mucosal lesions in the entire esophagus.                            Tortuous esophagus distally. Z-line regular, 41 cm                            from the incisors.                           - A Nissen fundoplication was found, characterized                            by a non-intact appearance.                           - Three gastric polyps. Resected and retrieved.                            Clips (MR conditional) were placed. Clip                            manufacturer: [employer].                           - Erosive gastritis in antrum.                           - Granular gastric mucosa throughout. Biopsied.                           - Hemoclip on anterior body from prior resection.                           - No gross lesions in the duodenal bulb, in the  first portion of the duodenum and in the second                            portion of the duodenum.                           EUS Impression:                           - Endosonographic images of the stomach were                            unremarkable.                           - No malignant-appearing lymph nodes were                            visualized in the celiac region (level  20),                            peripancreatic region and porta hepatis region. Moderate Sedation:      Not Applicable - Patient had care per Anesthesia. Recommendation:           - The patient will be observed post-procedure,                            until all discharge criteria are met.                           - Discharge patient to home.                           - Patient has a contact number available for                            emergencies. The signs and symptoms of potential                            delayed complications were discussed with the                            patient. Return to normal activities tomorrow.                            Written discharge instructions were provided to the                            patient.                           - Full liquid diet today. ADAT thereafter.                           - Observe patient's clinical course.                           -  Continue PCAB dosing.                           - Carafate 1 g BID x 14 days to increase healing.                           - Restart Xarelto on 11/20 to decrease risk of                            bleeding.                           - Await path results.                           - Based on results will consider repeat timing of                            EGD vs EUS with primary GI and Oncology.                           - The findings and recommendations were discussed                            with the patient. Procedure Code(s):        --- Professional ---                           [id number], Esophagogastroduodenoscopy, flexible,                            transoral; with endoscopic mucosal resection                           43237, Esophagogastroduodenoscopy, flexible,                            transoral; with endoscopic ultrasound examination                            limited to the esophagus, stomach or duodenum, and                            adjacent structures                            43239, 59, Esophagogastroduodenoscopy, flexible,                            transoral; with biopsy, single or multiple Diagnosis Code(s):        --- Professional ---                           Q39.9, Congenital malformation of esophagus,                            unspecified  K91.89, Other postprocedural complications and                            disorders of digestive system                           K31.7, Polyp of stomach and duodenum                           K29.70, Gastritis, unspecified, without bleeding                           K31.89, Other diseases of stomach and duodenum                           I89.9, Noninfective disorder of lymphatic vessels                            and lymph nodes, unspecified                           R93.5, Abnormal findings on diagnostic imaging of                            other abdominal regions, including retroperitoneum CPT copyright 2022 [employer]. All rights reserved. The codes documented in this report are preliminary and upon coder review may  be revised to meet current compliance requirements. Corliss Parish, MD 02/22/2023 12:27:01 PM Number of Addenda: 0

## 2023-02-22 NOTE — Discharge Instructions (Signed)

## 2023-02-22 NOTE — Transfer of Care (Signed)
Immediate Anesthesia Transfer of Care Note  Patient: NEIZAN HEINO  Procedure(s) Performed: UPPER ENDOSCOPIC ULTRASOUND (EUS) RADIAL ESOPHAGOGASTRODUODENOSCOPY (EGD) BIOPSY SUBMUCOSAL LIFTING INJECTION ENDOSCOPIC MUCOSAL RESECTION HEMOSTASIS CLIP PLACEMENT  Patient Location: Endoscopy Unit  Anesthesia Type:MAC  Level of Consciousness: drowsy  Airway & Oxygen Therapy: Patient Spontanous Breathing and Patient connected to face mask oxygen  Post-op Assessment: Report given to RN and Post -op Vital signs reviewed and stable  Post vital signs: Reviewed and stable  Last Vitals:  Vitals Value Taken Time  BP 164/128 02/22/23 1220  Temp    Pulse 52 02/22/23 1220  Resp 18 02/22/23 1220  SpO2 100 % 02/22/23 1220  Vitals shown include unfiled device data.  Last Pain:  Vitals:   02/22/23 0949  TempSrc: Temporal  PainSc: 0-No pain         Complications: No notable events documented.

## 2023-02-23 NOTE — Anesthesia Postprocedure Evaluation (Signed)
Anesthesia Post Note  Patient: Austin Woods  Procedure(s) Performed: UPPER ENDOSCOPIC ULTRASOUND (EUS) RADIAL ESOPHAGOGASTRODUODENOSCOPY (EGD) BIOPSY SUBMUCOSAL LIFTING INJECTION ENDOSCOPIC MUCOSAL RESECTION HEMOSTASIS CLIP PLACEMENT     Patient location during evaluation: Endoscopy Anesthesia Type: MAC Level of consciousness: awake Pain management: pain level controlled Vital Signs Assessment: post-procedure vital signs reviewed and stable Respiratory status: spontaneous breathing Cardiovascular status: stable Postop Assessment: no apparent nausea or vomiting Anesthetic complications: no  No notable events documented.  Last Vitals:  Vitals:   02/22/23 1250 02/22/23 1254  BP: (!) 151/70   Pulse: 66 (!) 48  Resp: 14 12  Temp:    SpO2: 99% 100%    Last Pain:  Vitals:   02/22/23 1254  TempSrc:   PainSc: 0-No pain                 Caren Macadam

## 2023-02-24 LAB — SURGICAL PATHOLOGY

## 2023-02-25 ENCOUNTER — Encounter (HOSPITAL_COMMUNITY): Payer: Self-pay | Admitting: Gastroenterology

## 2023-02-26 ENCOUNTER — Encounter: Payer: Self-pay | Admitting: Gastroenterology

## 2023-03-13 ENCOUNTER — Other Ambulatory Visit (INDEPENDENT_AMBULATORY_CARE_PROVIDER_SITE_OTHER): Payer: Self-pay | Admitting: Gastroenterology

## 2023-03-24 ENCOUNTER — Inpatient Hospital Stay: Payer: BC Managed Care – PPO | Attending: Hematology | Admitting: Hematology

## 2023-03-24 VITALS — BP 128/64 | HR 58 | Temp 98.8°F | Resp 18 | Ht 73.0 in | Wt 246.4 lb

## 2023-03-24 DIAGNOSIS — D3A092 Benign carcinoid tumor of the stomach: Secondary | ICD-10-CM | POA: Insufficient documentation

## 2023-03-24 DIAGNOSIS — Z87891 Personal history of nicotine dependence: Secondary | ICD-10-CM | POA: Insufficient documentation

## 2023-03-24 DIAGNOSIS — Z79899 Other long term (current) drug therapy: Secondary | ICD-10-CM | POA: Diagnosis not present

## 2023-03-24 NOTE — Progress Notes (Signed)
[facility] [address] [place] [address]    Clinic Day:  03/24/2023  Referring physician: Dion Saucier, PA-C  Patient Care Team: Antonietta Jewel as PCP - General (Physician Assistant) Marjo Bicker, MD as PCP - Cardiology (Cardiology) Mealor, Roberts Gaudy, MD as PCP - Electrophysiology (Cardiology) Doreatha Massed, MD as Medical Oncologist (Medical Oncology)   ASSESSMENT & PLAN:   Assessment: 1.  Gastric carcinoid tumor: - Has history of GERD for 20+ years.  Underwent Nissen's fundoplication 20 years ago at [facility]. - No B symptoms.  No flushing.  Has wheezing from asthma and uses Advair inhaler daily.  Reports soft stools for the past 1 year. - EGD and gastric nodule biopsy (04/16/2022): Well-differentiated neuroendocrine tumor.  Transitional type mucosa with mild PPI effect in the stomach.  Ki-67 3-10%.  Overall grade 1 well-differentiated neuroendocrine tumor. - EGD/EUS (07/16/2022): 3 5-7 mm submucosal nodules with no bleeding and no stigmata of recent bleeding were found in the anterior wall of the gastric body (between 50-52 cm from incisors).  Status post endoscopic mucosal resection.  No malignant lymph nodes were visualized in the paracardial, left gastric and gastrohepatic ligament and celiac region by EUS. 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  Stomach nodule #3 biopsy shows fundic gland polyp with linear and focal nodular neuroendocrine cell hyperplasia.  No malignancy identified.  Stomach biopsy also showed gastric antral and oxyntic mucosa with features of reactive gastropathy, negative for H. pylori.  Ki-67 was less than 3%.  Tumor cells positive for synaptophysin and CD56. - Gastrin level: 880 (08/28/2022), 423 (06/09/2022) - Chromogranin A (06/09/2022): 1228, 1602 on 08/28/2022. - Dotatate  PET scan (10/12/2022): Increased uptake within the wall of the proximal stomach just below the GE junction with SUV 8.9.  No abdominal pelvic lymphadenopathy.  Increased uptake in the prostate gland with SUV 8.7.  New indeterminate nodule within the right lung base without increased uptake measuring 1 cm.   2.  Social/family history: - Lives at home with his wife.  Works as a Engineer, petroleum at [employer].  Quit smoking 12 years ago.  Smoked half pack per day for 3 years. - No family history of malignancy.    Plan: 1.  Gastric carcinoid: - He underwent EGD/EUS and biopsy on 02/22/2023. - I reviewed pathology which did not show neuroendocrine tumor.  It showed hyperplastic polyp. - Recommend follow-up in 6 months with repeat dotatate scan, gastrin and chromogranin levels.    No orders of the defined types were placed in this encounter.     I,Katie Daubenspeck,acting as a Neurosurgeon for Doreatha Massed, MD.,have documented all relevant documentation on the behalf of Doreatha Massed, MD,as directed by  Doreatha Massed, MD while in the presence of Doreatha Massed, MD.   I, Doreatha Massed MD, have reviewed the above documentation for accuracy and completeness, and I agree with the above.   Katie Daubenspeck   12/18/20247:45 AM  CHIEF COMPLAINT:   Diagnosis: gastric carcinoid tumor  Cancer Staging  No matching staging information was found for the patient.    Prior Therapy: endoscopic mucosal resection, 07/16/22  Current Therapy:  surveillance   HISTORY OF PRESENT ILLNESS:   Oncology History   No history exists.     INTERVAL HISTORY:   Shuaib is a 57 y.o. male presenting to clinic today for follow up  of gastric carcinoid tumor. He was last seen by me on 01/21/23.  Since his last visit, he underwent surveillance EUS on 02/22/23 under Dr. Meridee Score. Pathology from stomach showed no evidence of NET.  Today, he states that he is doing well overall. His  appetite level is at 100%. His energy level is at 60%.  PAST MEDICAL HISTORY:   Past Medical History: Past Medical History:  Diagnosis Date   A-fib (HCC)    Allergy    All my life   Anemia    iron def anemia   Arthritis    Asthma    Coronary artery disease    Dysrhythmia    in and out Afib   GERD (gastroesophageal reflux disease)    Headache    High cholesterol    History of DVT (deep vein thrombosis)    a. left leg following ablation for SVT   Hypertension    Persistent atrial fibrillation (HCC)    a. Dx 08/2012, xarelto initiated; b. 09/2012 Tikosyn initiated -> DCCV -> Sinus c. PVI 03/2013 d. 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   Type 2 diabetes mellitus without complication, without long-term current use of insulin (HCC) 02/16/2018   Wolff-Parkinson-White (WPW) syndrome    resolved s/p RFCA 2001 by Dr Graciela Husbands    Surgical History: Past Surgical History:  Procedure Laterality Date   ATRIAL FIBRILLATION ABLATION  03/23/2013   PVI by DR Johney Frame for afib   ATRIAL FIBRILLATION ABLATION N/A 03/23/2013   Procedure: ATRIAL FIBRILLATION ABLATION;  Surgeon: Gardiner Rhyme, MD;  Location: [facility] CATH LAB;  Service: Cardiovascular;  Laterality: N/A;   ATRIAL FIBRILLATION ABLATION N/A 03/27/2014   PVI Dr Allyne Gee SURGERY     BIOPSY  02/28/2020   Procedure: BIOPSY;  Surgeon: Malissa Hippo, MD;  Location: [facility] ENDO SU[facility];  Service: Endoscopy;;  antral(pre-pyloric)    BIOPSY  04/16/2022   Procedure: BIOPSY;  Surgeon: Dolores Frame, MD;  [facility]: [facility];  Service: Gastroenterology;;   BIOPSY  07/16/2022   Procedure: BIOPSY;  Surgeon: Lemar Lofty., MD;  Location: [facility] ENDOSCOPY;  Service: Gastroenterology;;   BIOPSY  02/22/2023   Procedure: BIOPSY;  Surgeon: Lemar Lofty., MD;  Location: [facility] ENDOSCOPY;  Service: Gastroenterology;;   CARDIAC CATHETERIZATION N/A 02/03/2016   Procedure: Left Heart Cath and  Coronary Angiography;  Surgeon: Yvonne Kendall, MD;  Location: [facility] INVASIVE CV LAB;  Service: Cardiovascular;  Laterality: N/A;   CARDIAC CATHETERIZATION N/A 02/03/2016   Procedure: Intravascular Pressure Wire/FFR Study;  Surgeon: Yvonne Kendall, MD;  Location: [facility] INVASIVE CV LAB;  Service: Cardiovascular;  Laterality: N/A;   CARDIAC CATHETERIZATION N/A 02/03/2016   Procedure: Coronary Stent Intervention;  Surgeon: Yvonne Kendall, MD;  Location: [facility] INVASIVE CV LAB;  Service: Cardiovascular;  Laterality: N/A;   CARDIAC SURGERY     CARDIOVERSION N/A 08/25/2012   Procedure: CARDIOVERSION;  Surgeon: Vesta Mixer, MD;  Location: [facility] ENDOSCOPY;  Service: Cardiovascular;  Laterality: N/A;   CARDIOVERSION N/A 09/06/2012   Procedure: CARDIOVERSION/Bedside;  Surgeon: Luis Abed, MD;  Location: [facility] OR;  Service: Cardiovascular;  Laterality: N/A;   CARDIOVERSION N/A 01/12/2014   Procedure: CARDIOVERSION;  Surgeon: Chrystie Nose, MD;  Location: [facility] ENDOSCOPY;  Service: Cardiovascular;  Laterality: N/A;   CARDIOVERSION N/A 01/30/2016   Procedure: CARDIOVERSION;  Surgeon: Quintella Reichert, MD;  Location: [facility] ENDOSCOPY;  Service: Cardiovascular;  Laterality: N/A;  CARDIOVERSION N/A 04/14/2016   Procedure: CARDIOVERSION;  Surgeon: Chrystie Nose, MD;  Location: [facility] ENDOSCOPY;  Service: Cardiovascular;  Laterality: N/A;   COLONOSCOPY N/A 12/04/2015   Procedure: COLONOSCOPY;  Surgeon: Malissa Hippo, MD;  [facility]: [facility];  Service: Endoscopy;  Laterality: N/A;  730   COLONOSCOPY WITH PROPOFOL N/A 04/28/2019   Procedure: COLONOSCOPY WITH PROPOFOL;  Surgeon: Malissa Hippo, MD;  [facility]: [facility];  Service: Endoscopy;  Laterality: N/A;   CORONARY ANGIOPLASTY     CORONARY ANGIOPLASTY WITH STENT PLACEMENT     ELECTROPHYSIOLOGIC STUDY N/A 02/18/2016   Procedure: Atrial Fibrillation Ablation;  Surgeon: Hillis Range, MD;  Location: [facility] INVASIVE CV LAB;  Service: Cardiovascular;  Laterality: N/A;    ENDOSCOPIC MUCOSAL RESECTION N/A 07/16/2022   Procedure: ENDOSCOPIC MUCOSAL RESECTION;  Surgeon: Meridee Score Netty Starring., MD;  Location: [facility] ENDOSCOPY;  Service: Gastroenterology;  Laterality: N/A;   ENDOSCOPIC MUCOSAL RESECTION  02/22/2023   Procedure: ENDOSCOPIC MUCOSAL RESECTION;  Surgeon: Meridee Score Netty Starring., MD;  Location: [facility] ENDOSCOPY;  Service: Gastroenterology;;   ESOPHAGOGASTRODUODENOSCOPY N/A 02/22/2023   Procedure: ESOPHAGOGASTRODUODENOSCOPY (EGD);  Surgeon: Lemar Lofty., MD;  Location: [facility] ENDOSCOPY;  Service: Gastroenterology;  Laterality: N/A;   ESOPHAGOGASTRODUODENOSCOPY (EGD) WITH PROPOFOL N/A 04/28/2019   Procedure: ESOPHAGOGASTRODUODENOSCOPY (EGD) WITH PROPOFOL;  Surgeon: Malissa Hippo, MD;  [facility];  Service: Endoscopy;  Laterality: N/A;  7:30   ESOPHAGOGASTRODUODENOSCOPY (EGD) WITH PROPOFOL N/A 02/28/2020   Procedure: ESOPHAGOGASTRODUODENOSCOPY (EGD) WITH PROPOFOL;  Surgeon: Malissa Hippo, MD;  [facility];  Service: Endoscopy;  Laterality: N/A;  1245   ESOPHAGOGASTRODUODENOSCOPY (EGD) WITH PROPOFOL N/A 04/16/2022   Procedure: ESOPHAGOGASTRODUODENOSCOPY (EGD) WITH PROPOFOL;  Surgeon: Dolores Frame, MD;  [facility];  Service: Gastroenterology;  Laterality: N/A;  730am, asa 1-2   ESOPHAGOGASTRODUODENOSCOPY (EGD) WITH PROPOFOL N/A 07/16/2022   Procedure: ESOPHAGOGASTRODUODENOSCOPY (EGD) WITH PROPOFOL;  Surgeon: Meridee Score Netty Starring., MD;  Location: [facility] ENDOSCOPY;  Service: Gastroenterology;  Laterality: N/A;   EUS N/A 07/16/2022   Procedure: UPPER ENDOSCOPIC ULTRASOUND (EUS) RADIAL;  Surgeon: Lemar Lofty., MD;  Location: [facility] ENDOSCOPY;  Service: Gastroenterology;  Laterality: N/A;   EUS N/A 02/22/2023   Procedure: UPPER ENDOSCOPIC ULTRASOUND (EUS) RADIAL;  Surgeon: Lemar Lofty., MD;  Location: [facility] ENDOSCOPY;  Service: Gastroenterology;  Laterality: N/A;   HEMOSTASIS CLIP PLACEMENT   07/16/2022   Procedure: HEMOSTASIS CLIP PLACEMENT;  Surgeon: Lemar Lofty., MD;  Location: [facility] ENDOSCOPY;  Service: Gastroenterology;;   HEMOSTASIS CLIP PLACEMENT  02/22/2023   Procedure: HEMOSTASIS CLIP PLACEMENT;  Surgeon: Lemar Lofty., MD;  Location: [facility] ENDOSCOPY;  Service: Gastroenterology;;   HERNIA REPAIR     10 years   implantable loop recorder removal  03/01/2019   MDT Reveal LINQ removed in office   INGUINAL HERNIA REPAIR Bilateral 06/04/2017   Procedure: OPEN BILATERAL INGUINAL HERNIA REPAIR;  Surgeon: Jimmye Norman, MD;  Location: [facility] OR;  Service: General;  Laterality: Bilateral;   LOOP RECORDER IMPLANT N/A 06/29/2014   Procedure: LOOP RECORDER IMPLANT;  Surgeon: Hillis Range, MD;  Location: [facility] CATH LAB;  Service: Cardiovascular;  Laterality: N/A;   NOSE SURGERY     sleep apnea surgery   PERIPHERAL VASCULAR CATHETERIZATION N/A 03/13/2016   Procedure: Thrombin Injection;  Surgeon: Nada Libman, MD;  Location: [facility] INVASIVE CV LAB;  Service: Cardiovascular;  Laterality: N/A;   POLYPECTOMY  04/28/2019   Procedure: POLYPECTOMY;  Surgeon: Malissa Hippo, MD;  [facility];  Service: Endoscopy;;  duodenum;splenic flexure;   SPINE SURGERY     10 years agao   STOMACH SURGERY     reflux, fundoplication   SUBMUCOSAL LIFTING INJECTION  07/16/2022   Procedure: SUBMUCOSAL LIFTING INJECTION;  Surgeon: Lemar Lofty., MD;  Location: [facility] ENDOSCOPY;  Service: Gastroenterology;;   SUBMUCOSAL LIFTING INJECTION  02/22/2023   Procedure: SUBMUCOSAL LIFTING INJECTION;  Surgeon: Lemar Lofty., MD;  Location: [facility] ENDOSCOPY;  Service: Gastroenterology;;   TEE WITHOUT CARDIOVERSION N/A 08/25/2012   Procedure: TRANSESOPHAGEAL ECHOCARDIOGRAM (TEE);  Surgeon: Vesta Mixer, MD;  Location: [facility] ENDOSCOPY;  Service: Cardiovascular;  Laterality: N/A;   TEE WITHOUT CARDIOVERSION  03/23/2013   DR ROSS   TEE WITHOUT CARDIOVERSION N/A 03/23/2013   Procedure:  TRANSESOPHAGEAL ECHOCARDIOGRAM (TEE);  Surgeon: Pricilla Riffle, MD;  Location: [facility] ENDOSCOPY;  Service: Cardiovascular;  Laterality: N/A;   TEE WITHOUT CARDIOVERSION N/A 03/26/2014   Procedure: TRANSESOPHAGEAL ECHOCARDIOGRAM (TEE);  Surgeon: Wendall Stade, MD;  Location: [facility] ENDOSCOPY;  Service: Cardiovascular;  Laterality: N/A;   TONSILLECTOMY      Social History: Social History   Socioeconomic History   Marital status: Married    Spouse name: Not on file   Number of children: 2   Years of education: Not on file   Highest education level: Not on file  Occupational History    Employer: [employer]  Tobacco Use   Smoking status: Former    Current packs/day: 0.00    Average packs/day: 1 pack/day for 7.2 years (7.2 ttl pk-yrs)    Types: Cigarettes    Start date: 05/19/2003    Quit date: 08/11/2012    Years since quitting: 10.6   Smokeless tobacco: Never  Vaping Use   Vaping status: Never Used  Substance and Sexual Activity   Alcohol use: No   Drug use: No   Sexual activity: Yes    Birth control/protection: None  Other Topics Concern   Not on file  Social History Narrative   Pt lives in [place] with wife.  Works at [employer]: Not on [employer] Insecurity: Not on file  Transportation Needs: Not on file  Physical Activity: Not on file  Stress: Not on file  Social Connections: Not on file  Intimate Partner Violence: Not on file    Family History: Family History  Problem Relation Age of Onset   Diabetes Mother    Anemia Mother    Gout Mother    Kidney disease Mother    Obesity Mother    CAD Father 39   Diabetes Father    Heart attack Father    Transient ischemic attack Father    Vascular Disease Father    Heart disease Father    Hypertension Father    Obesity Father    Stroke Father    Diabetes Brother    Aneurysm Maternal Grandmother    Stroke Maternal Grandfather    Diabetes Paternal Grandmother    Stroke  Paternal Grandfather     Current Medications:  Current Outpatient Medications:    albuterol (VENTOLIN HFA) 108 (90 Base) MCG/ACT inhaler, Inhale 2 puffs into the lungs every 6 (six) hours as needed for wheezing or shortness of breath., Disp: 8 g, Rfl: 0   atorvastatin (LIPITOR) 40 MG tablet, Take 1 tablet (40 mg total) by mouth daily., Disp: 90 tablet, Rfl: 3   azelastine (ASTELIN) 0.1 % nasal spray, Place 1 spray into both  nostrils 2 (two) times daily as needed for rhinitis or allergies., Disp: , Rfl:    blood glucose meter kit and supplies, Dispense based on patient and insurance preference. Use to check blood sugar once a day(FOR ICD-10 E10.9, E11.9)., Disp: 1 each, Rfl: 0   Blood Glucose Monitoring Suppl (CONTOUR NEXT MONITOR) w/Device KIT, as directed per package instructions twice a day, Disp: , Rfl:    budesonide-formoterol (SYMBICORT) 160-4.5 MCG/ACT inhaler, 1 puff as needed Inhalation every 4 hrs, Disp: , Rfl:    cetirizine (ZYRTEC) 10 MG tablet, Take 10 mg by mouth daily., Disp: , Rfl:    Cholecalciferol (VITAMIN D) 2000 UNITS CAPS, Take 4,000 Units by mouth daily. , Disp: , Rfl:    dofetilide (TIKOSYN) 500 MCG capsule, TAKE 1 CAPSULE BY MOUTH TWICE  DAILY, Disp: 180 capsule, Rfl: 1   enalapril (VASOTEC) 10 MG tablet, Take 1 tablet (10 mg total) by mouth 2 (two) times daily., Disp: 180 tablet, Rfl: 3   fluticasone (FLONASE) 50 MCG/ACT nasal spray, Place 1 spray into both nostrils daily as needed for allergies. , Disp: , Rfl:    Fluticasone-Salmeterol (ADVAIR DISKUS) 250-50 MCG/DOSE AEPB, Inhale 1 puff into the lungs every 12 (twelve) hours., Disp: 60 each, Rfl: 0   furosemide (LASIX) 40 MG tablet, Take 1 tablet (40 mg total) by mouth as needed., Disp: , Rfl:    glucose blood (CONTOUR NEXT TEST) test strip, as directed per package instructions In Vitro twice a day, Disp: , Rfl:    hydrOXYzine (VISTARIL) 25 MG capsule, Take 2 capsules (50 mg total) by mouth 3 (three) times daily as  needed for anxiety., Disp: 30 capsule, Rfl: 0   loratadine (CLARITIN) 10 MG tablet, Take 10 mg by mouth daily as needed for allergies. , Disp: , Rfl:    magnesium chloride (SLOW-MAG) 64 MG TBEC SR tablet, 2 tablets Orally Once a day, Disp: , Rfl:    Magnesium Oxide 400 MG CAPS, Take one tablet by mouth twice daily (Patient taking differently: Take one tablet by mouth daily), Disp: 60 capsule, Rfl: 9   meloxicam (MOBIC) 15 MG tablet, Take 15 mg by mouth daily as needed for pain., Disp: , Rfl:    metFORMIN (GLUCOPHAGE) 1000 MG tablet, TAKE 1 TABLET BY MOUTH TWICE  DAILY WITH MEALS, Disp: 30 tablet, Rfl: 0   metoprolol tartrate (LOPRESSOR) 50 MG tablet, Take 50 mg by mouth daily., Disp: , Rfl:    nitroGLYCERIN (NITROSTAT) 0.4 MG SL tablet, DISSOLVE 1 TABLET UNDER THE TONGUE EVERY 5 MINUTES AS  NEEDED FOR CHEST PAIN. MAX  OF 3 TABLETS IN 15 MINUTES. CALL 911 IF PAIN PERSISTS., Disp: 25 tablet, Rfl: 1   ondansetron (ZOFRAN) 4 MG tablet, TAKE 1 TABLET BY MOUTH EVERY 8 HOURS AS NEEDED FOR NAUSEA AND VOMITING, Disp: 30 tablet, Rfl: 0   oxybutynin (DITROPAN) 5 MG tablet, TAKE ONE-HALF TABLET (2.5mg ) BY  MOUTH TWO TIMES DAILY AS  NEEDED FOR BLADDER SPASMS, Disp: 30 tablet, Rfl: 5   pantoprazole (PROTONIX) 40 MG tablet, Take 40 mg by mouth 2 (two) times daily., Disp: , Rfl:    potassium chloride SA (KLOR-CON) 20 MEQ tablet, TAKE 1 TABLET BY MOUTH  DAILY, Disp: 90 tablet, Rfl: 3   rivaroxaban (XARELTO) 20 MG TABS tablet, Take 1 tablet (20 mg total) by mouth daily with supper., Disp: , Rfl:    Simethicone 80 MG TABS, Take 1 tablet (80 mg total) by mouth every 6 (six) hours as needed., Disp: 60  tablet, Rfl: 1   sucralfate (CARAFATE) 1 g tablet, Take 1 tablet (1 g total) by mouth 2 (two) times daily., Disp: 30 tablet, Rfl: 0   vitamin B-12 (CYANOCOBALAMIN) 500 MCG tablet, Take 500 mcg by mouth daily., Disp: , Rfl:    Vonoprazan Fumarate (VOQUEZNA) 10 MG TABS, Take 10 mg by mouth daily at 6 (six) AM., Disp: 30  tablet, Rfl: 5   Allergies: No Known Allergies  REVIEW OF SYSTEMS:   Review of Systems  Constitutional:  Negative for chills, fatigue and fever.  HENT:   Negative for lump/mass, mouth sores, nosebleeds, sore throat and trouble swallowing.   Eyes:  Negative for eye problems.  Respiratory:  Positive for cough and shortness of breath.   Cardiovascular:  Negative for chest pain, leg swelling and palpitations.  Gastrointestinal:  Positive for constipation, diarrhea and nausea. Negative for abdominal pain and vomiting.  Genitourinary:  Negative for bladder incontinence, difficulty urinating, dysuria, frequency, hematuria and nocturia.   Musculoskeletal:  Negative for arthralgias, back pain, flank pain, myalgias and neck pain.  Skin:  Negative for itching and rash.  Neurological:  Positive for numbness. Negative for dizziness and headaches.  Hematological:  Does not bruise/bleed easily.  Psychiatric/Behavioral:  Negative for depression, sleep disturbance and suicidal ideas. The patient is not nervous/anxious.   All other systems reviewed and are negative.    VITALS:   There were no vitals taken for this visit.  Wt Readings from Last 3 Encounters:  02/22/23 238 lb (108 kg)  01/21/23 252 lb 11.2 oz (114.6 kg)  12/14/22 249 lb 6.4 oz (113.1 kg)    There is no height or weight on file to calculate BMI.  Performance status (ECOG): 1 - Symptomatic but completely ambulatory  PHYSICAL EXAM:   Physical Exam Vitals and nursing note reviewed. Exam conducted with a chaperone present.  Constitutional:      Appearance: Normal appearance.  Cardiovascular:     Rate and Rhythm: Normal rate and regular rhythm.     Pulses: Normal pulses.     Heart sounds: Normal heart sounds.  Pulmonary:     Effort: Pulmonary effort is normal.     Breath sounds: Normal breath sounds.  Abdominal:     Palpations: Abdomen is soft. There is no hepatomegaly, splenomegaly or mass.     Tenderness: There is no  abdominal tenderness.  Musculoskeletal:     Right lower leg: No edema.     Left lower leg: No edema.  Lymphadenopathy:     Cervical: No cervical adenopathy.     Right cervical: No superficial, deep or posterior cervical adenopathy.    Left cervical: No superficial, deep or posterior cervical adenopathy.     Upper Body:     Right upper body: No supraclavicular or axillary adenopathy.     Left upper body: No supraclavicular or axillary adenopathy.  Neurological:     General: No focal deficit present.     Mental Status: He is alert and oriented to person, place, and time.  Psychiatric:        Mood and Affect: Mood normal.        Behavior: Behavior normal.     LABS:      Latest Ref Rng & Units 01/29/2020    9:30 AM 08/07/2019   12:50 PM 04/26/2019   10:15 AM  CBC  WBC 3.4 - 10.8 x10E3/uL 7.6  8.6  8.7   Hemoglobin 13.0 - 17.7 g/dL 38.7  56.4  11.3  Hematocrit 37.5 - 51.0 % 42.1  46.8  36.8   Platelets 150 - 450 x10E3/uL 357  285  275       Latest Ref Rng & Units 12/14/2022    9:07 AM 04/09/2022    3:24 PM 06/11/2021    3:00 PM  CMP  Glucose 70 - 99 mg/dL 119  147  829   BUN 6 - 20 mg/dL 18  20  20    Creatinine 0.61 - 1.24 mg/dL 5.62  1.30  8.65   Sodium 135 - 145 mmol/L 138  138  137   Potassium 3.5 - 5.1 mmol/L 4.4  4.5  4.5   Chloride 98 - 111 mmol/L 102  105  103   CO2 22 - 32 mmol/L 26  24  25    Calcium 8.9 - 10.3 mg/dL 9.1  9.0  9.1      No results found for: "CEA1", "CEA" / No results found for: "CEA1", "CEA" Lab Results  Component Value Date   PSA1 0.3 11/25/2018   No results found for: "HQI696" No results found for: "CAN125"  No results found for: "TOTALPROTELP", "ALBUMINELP", "A1GS", "A2GS", "BETS", "BETA2SER", "GAMS", "MSPIKE", "SPEI" Lab Results  Component Value Date   TIBC 462 (H) 01/29/2020   TIBC 509 (H) 03/24/2019   TIBC 545 (H) 02/13/2019   FERRITIN 12 (L) 01/29/2020   FERRITIN 4 (L) 03/24/2019   FERRITIN 4 (L) 02/13/2019   IRONPCTSAT 25  01/29/2020   IRONPCTSAT 5 (L) 03/24/2019   IRONPCTSAT 4 (L) 02/13/2019   No results found for: "LDH"   STUDIES:   No results found.

## 2023-03-24 NOTE — Patient Instructions (Addendum)
[facility] at [facility] Discharge Instructions   You were seen and examined today by Dr. Ellin Saba.  He reviewed the results of you biopsy which did not show any cancer.   We will see you back in 6 months. We will repeat labs and a scan prior to this visit.    Thank you for choosing [facility] at [facility] to provide your oncology and hematology care.  To afford each patient quality time with our provider, please arrive at least 15 minutes before your scheduled appointment time.   If you have a lab appointment with the Cancer Center please come in thru the Main Entrance and check in at the main information desk.  You need to re-schedule your appointment should you arrive 10 or more minutes late.  We strive to give you quality time with our providers, and arriving late affects you and other patients whose appointments are after yours.  Also, if you no show three or more times for appointments you may be dismissed from the clinic at the providers discretion.     Again, thank you for choosing [facility].  Our hope is that these requests will decrease the amount of time that you wait before being seen by our physicians.       _____________________________________________________________  Should you have questions after your visit to [facility], please contact our office at [phone] and follow the prompts.  Our office hours are 8:00 a.m. and 4:30 p.m. Monday - Friday.  Please note that voicemails left after 4:00 p.m. may not be returned until the following business day.  We are closed weekends and major holidays.  You do have access to a nurse 24-7, just call the main number to the clinic [phone] and do not press any options, hold on the line and a nurse will answer the phone.    For prescription refill requests, have your pharmacy contact our office and allow 72 hours.    Due to Covid, you will need to wear a  mask upon entering the hospital. If you do not have a mask, a mask will be given to you at the Main Entrance upon arrival. For doctor visits, patients may have 1 support person age 57 or older with them. For treatment visits, patients can not have anyone with them due to social distancing guidelines and our immunocompromised population.

## 2023-03-25 ENCOUNTER — Other Ambulatory Visit: Payer: Self-pay | Admitting: Internal Medicine

## 2023-04-06 ENCOUNTER — Other Ambulatory Visit (INDEPENDENT_AMBULATORY_CARE_PROVIDER_SITE_OTHER): Payer: Self-pay | Admitting: Gastroenterology

## 2023-05-01 ENCOUNTER — Ambulatory Visit
Admission: EM | Admit: 2023-05-01 | Discharge: 2023-05-01 | Disposition: A | Discharge status: Home / Self Care | Payer: BC Managed Care – PPO | Attending: Family Medicine | Admitting: Family Medicine

## 2023-05-01 DIAGNOSIS — J4541 Moderate persistent asthma with (acute) exacerbation: Secondary | ICD-10-CM

## 2023-05-01 DIAGNOSIS — J069 Acute upper respiratory infection, unspecified: Secondary | ICD-10-CM

## 2023-05-01 MED ORDER — DEXAMETHASONE SODIUM PHOSPHATE 10 MG/ML IJ SOLN
10.0000 mg | Freq: Once | INTRAMUSCULAR | Status: AC
  Administered 2023-05-01: 10 mg via INTRAMUSCULAR

## 2023-05-01 MED ORDER — PROMETHAZINE-DM 6.25-15 MG/5ML PO SYRP
5.0000 mL | ORAL_SOLUTION | Freq: Four times a day (QID) | ORAL | 0 refills | Status: DC | PRN
Start: 2023-05-01 — End: 2023-07-20

## 2023-05-01 NOTE — ED Provider Notes (Signed)
[facility]    CSN: [id number] Arrival date & time: 05/01/23  5784      History   Chief Complaint No chief complaint on file.   HPI Austin Woods is a 58 y.o. male.   Patient presenting today with 3-day history of chest congestion, cough, muscular spasms in his back, nasal congestion, watery eyes.  Denies fever, chills, chest pain, shortness of breath, abdominal pain, nausea vomiting or diarrhea.  History of significant seasonal allergies and asthma on consistent daily regimen for both.  No known sick contacts recently.    Past Medical History:  Diagnosis Date   A-fib [facility])    Allergy    All my life   Anemia    iron def anemia   Arthritis    Asthma    Coronary artery disease    Dysrhythmia    in and out Afib   GERD (gastroesophageal reflux disease)    Headache    High cholesterol    History of DVT (deep vein thrombosis)    a. left leg following ablation for SVT   Hypertension    Persistent atrial fibrillation (HCC)    a. Dx 08/2012, xarelto initiated; b. 09/2012 Tikosyn initiated -> DCCV -> Sinus c. PVI 03/2013 d. 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 06/22/2022   CAD (coronary artery disease) 04/24/2022   HLD (hyperlipidemia) 04/24/2022   GERD (gastroesophageal reflux disease) 03/23/2022   Iron deficiency anemia 03/21/2019   History of colonic polyps 03/21/2019   Upper abdominal pain 02/09/2019   Nausea without vomiting 02/09/2019   Abdominal  bloating 02/09/2019   Anemia 02/09/2019   Type 2 diabetes mellitus without complication, without long-term current use of insulin (HCC) 02/16/2018   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   Chest pain in adult 07/10/2016   Atypical chest pain 07/10/2016   Pseudoaneurysm following procedure (HCC) 03/03/2016   A-fib (HCC) 02/18/2016   Chest pain 01/30/2016   Persistent atrial fibrillation (HCC)    Unstable angina pectoris (HCC)    Asthma, chronic 08/26/2014   Palpitations 06/28/2014   Atrial fibrillation with RVR (HCC) 02/02/2014   Acute on chronic systolic CHF (congestive heart failure) (HCC) 02/02/2014   Obstructive sleep apnea 10/12/2012   WPW (Wolff-Parkinson-White syndrome) 09/05/2012   Hypokalemia 09/05/2012   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   Essential hypertension, benign 09/03/2012   Fatigue 08/15/2012   Morbid obesity (HCC) 08/15/2012   Atrial fibrillation (HCC) 08/07/2012    Past Surgical History:  Procedure Laterality Date   ATRIAL FIBRILLATION ABLATION  03/23/2013   PVI by DR Johney Frame for afib   ATRIAL FIBRILLATION ABLATION N/A 03/23/2013   Procedure: ATRIAL FIBRILLATION ABLATION;  Surgeon: Gardiner Rhyme, MD;  Location: [facility] CATH LAB;  Service: Cardiovascular;  Laterality: N/A;   ATRIAL FIBRILLATION ABLATION N/A 03/27/2014   PVI Dr Allyne Gee SURGERY     BIOPSY  02/28/2020   Procedure: BIOPSY;  Surgeon: Malissa Hippo, MD;  Location: [facility];  Service: Endoscopy;;  antral(pre-pyloric)    BIOPSY  04/16/2022   Procedure: BIOPSY;  Surgeon: Dolores Frame, MD;  [facility]: [facility];  Service: Gastroenterology;;   BIOPSY  07/16/2022   Procedure: BIOPSY;  Surgeon: Lemar Lofty., MD;  Location: [facility] ENDOSCOPY;  Service: Gastroenterology;;   BIOPSY  02/22/2023   Procedure: BIOPSY;  Surgeon: Lemar Lofty., MD;  Location: [facility] ENDOSCOPY;  Service: Gastroenterology;;   CARDIAC CATHETERIZATION N/A 02/03/2016   Procedure:  Left Heart Cath and Coronary Angiography;  Surgeon: Yvonne Kendall, MD;  Location: [facility] INVASIVE CV LAB;  Service: Cardiovascular;  Laterality: N/A;   CARDIAC CATHETERIZATION N/A 02/03/2016   Procedure: Intravascular Pressure Wire/FFR Study;  Surgeon: Yvonne Kendall, MD;  Location: [facility] INVASIVE CV LAB;  Service: Cardiovascular;  Laterality: N/A;   CARDIAC CATHETERIZATION N/A 02/03/2016   Procedure: Coronary Stent Intervention;  Surgeon: Yvonne Kendall, MD;  Location: [facility] INVASIVE CV LAB;  Service: Cardiovascular;  Laterality: N/A;   CARDIAC SURGERY     CARDIOVERSION N/A 08/25/2012   Procedure: CARDIOVERSION;  Surgeon: Vesta Mixer, MD;  Location: [facility] ENDOSCOPY;  Service: Cardiovascular;  Laterality: N/A;   CARDIOVERSION N/A 09/06/2012   Procedure: CARDIOVERSION/Bedside;  Surgeon: Luis Abed, MD;  Location: [facility] OR;  Service: Cardiovascular;  Laterality: N/A;   CARDIOVERSION N/A 01/12/2014   Procedure: CARDIOVERSION;  Surgeon: Chrystie Nose, MD;  Location: [facility] ENDOSCOPY;  Service: Cardiovascular;  Laterality: N/A;   CARDIOVERSION N/A 01/30/2016   Procedure: CARDIOVERSION;  Surgeon: Quintella Reichert, MD;  Location: [facility] ENDOSCOPY;  Service: Cardiovascular;  Laterality: N/A;   CARDIOVERSION N/A 04/14/2016   Procedure: CARDIOVERSION;  Surgeon: Chrystie Nose, MD;  Location: [facility] ENDOSCOPY;  Service: Cardiovascular;  Laterality: N/A;   COLONOSCOPY N/A 12/04/2015   Procedure: COLONOSCOPY;  Surgeon: Malissa Hippo, MD;  [facility];  Service: Endoscopy;  Laterality: N/A;  730   COLONOSCOPY WITH PROPOFOL N/A 04/28/2019   Procedure: COLONOSCOPY WITH PROPOFOL;  Surgeon: Malissa Hippo, MD;  [facility];  Service: Endoscopy;  Laterality: N/A;   CORONARY ANGIOPLASTY     CORONARY ANGIOPLASTY WITH STENT PLACEMENT     ELECTROPHYSIOLOGIC STUDY N/A 02/18/2016   Procedure: Atrial Fibrillation Ablation;  Surgeon: Hillis Range, MD;  Location: [facility] INVASIVE CV LAB;  Service:  Cardiovascular;  Laterality: N/A;   ENDOSCOPIC MUCOSAL RESECTION N/A 07/16/2022   Procedure: ENDOSCOPIC MUCOSAL RESECTION;  Surgeon: Meridee Score Netty Starring., MD;  Location: [facility] ENDOSCOPY;  Service: Gastroenterology;  Laterality: N/A;   ENDOSCOPIC MUCOSAL RESECTION  02/22/2023   Procedure: ENDOSCOPIC MUCOSAL RESECTION;  Surgeon: Meridee Score Netty Starring., MD;  Location: [facility] ENDOSCOPY;  Service: Gastroenterology;;   ESOPHAGOGASTRODUODENOSCOPY N/A 02/22/2023   Procedure: ESOPHAGOGASTRODUODENOSCOPY (EGD);  Surgeon: Lemar Lofty., MD;  Location: [facility] ENDOSCOPY;  Service: Gastroenterology;  Laterality: N/A;   ESOPHAGOGASTRODUODENOSCOPY (EGD) WITH PROPOFOL N/A 04/28/2019   Procedure: ESOPHAGOGASTRODUODENOSCOPY (EGD) WITH PROPOFOL;  Surgeon: Malissa Hippo, MD;  [facility];  Service: Endoscopy;  Laterality: N/A;  7:30   ESOPHAGOGASTRODUODENOSCOPY (EGD) WITH PROPOFOL N/A 02/28/2020   Procedure: ESOPHAGOGASTRODUODENOSCOPY (EGD) WITH PROPOFOL;  Surgeon: Malissa Hippo, MD;  [facility];  Service: Endoscopy;  Laterality: N/A;  1245   ESOPHAGOGASTRODUODENOSCOPY (EGD) WITH PROPOFOL N/A 04/16/2022   Procedure: ESOPHAGOGASTRODUODENOSCOPY (EGD) WITH PROPOFOL;  Surgeon: Dolores Frame, MD;  [facility];  Service: Gastroenterology;  Laterality: N/A;  730am, asa 1-2   ESOPHAGOGASTRODUODENOSCOPY (EGD) WITH PROPOFOL N/A 07/16/2022   Procedure: ESOPHAGOGASTRODUODENOSCOPY (  EGD) WITH PROPOFOL;  Surgeon: Mansouraty, Netty Starring., MD;  Location: [facility] ENDOSCOPY;  Service: Gastroenterology;  Laterality: N/A;   EUS N/A 07/16/2022   Procedure: UPPER ENDOSCOPIC ULTRASOUND (EUS) RADIAL;  Surgeon: Lemar Lofty., MD;  Location: [facility] ENDOSCOPY;  Service: Gastroenterology;  Laterality: N/A;   EUS N/A 02/22/2023   Procedure: UPPER ENDOSCOPIC ULTRASOUND (EUS) RADIAL;  Surgeon: Lemar Lofty., MD;  Location: [facility] ENDOSCOPY;  Service: Gastroenterology;  Laterality: N/A;    HEMOSTASIS CLIP PLACEMENT  07/16/2022   Procedure: HEMOSTASIS CLIP PLACEMENT;  Surgeon: Lemar Lofty., MD;  Location: [facility] ENDOSCOPY;  Service: Gastroenterology;;   HEMOSTASIS CLIP PLACEMENT  02/22/2023   Procedure: HEMOSTASIS CLIP PLACEMENT;  Surgeon: Lemar Lofty., MD;  Location: [facility] ENDOSCOPY;  Service: Gastroenterology;;   HERNIA REPAIR     10 years   implantable loop recorder removal  03/01/2019   MDT Reveal LINQ removed in office   INGUINAL HERNIA REPAIR Bilateral 06/04/2017   Procedure: OPEN BILATERAL INGUINAL HERNIA REPAIR;  Surgeon: Jimmye Norman, MD;  Location: [facility] OR;  Service: General;  Laterality: Bilateral;   LOOP RECORDER IMPLANT N/A 06/29/2014   Procedure: LOOP RECORDER IMPLANT;  Surgeon: Hillis Range, MD;  Location: [facility] CATH LAB;  Service: Cardiovascular;  Laterality: N/A;   NOSE SURGERY     sleep apnea surgery   PERIPHERAL VASCULAR CATHETERIZATION N/A 03/13/2016   Procedure: Thrombin Injection;  Surgeon: Nada Libman, MD;  Location: [facility] INVASIVE CV LAB;  Service: Cardiovascular;  Laterality: N/A;   POLYPECTOMY  04/28/2019   Procedure: POLYPECTOMY;  Surgeon: Malissa Hippo, MD;  [facility];  Service: Endoscopy;;  duodenum;splenic flexure;   SPINE SURGERY     10 years agao   STOMACH SURGERY     reflux, fundoplication   SUBMUCOSAL LIFTING INJECTION  07/16/2022   Procedure: SUBMUCOSAL LIFTING INJECTION;  Surgeon: Lemar Lofty., MD;  Location: [facility] ENDOSCOPY;  Service: Gastroenterology;;   SUBMUCOSAL LIFTING INJECTION  02/22/2023   Procedure: SUBMUCOSAL LIFTING INJECTION;  Surgeon: Lemar Lofty., MD;  Location: [facility] ENDOSCOPY;  Service: Gastroenterology;;   TEE WITHOUT CARDIOVERSION N/A 08/25/2012   Procedure: TRANSESOPHAGEAL ECHOCARDIOGRAM (TEE);  Surgeon: Vesta Mixer, MD;  Location: [facility] ENDOSCOPY;  Service: Cardiovascular;  Laterality: N/A;   TEE WITHOUT CARDIOVERSION  03/23/2013   DR ROSS   TEE WITHOUT CARDIOVERSION  N/A 03/23/2013   Procedure: TRANSESOPHAGEAL ECHOCARDIOGRAM (TEE);  Surgeon: Pricilla Riffle, MD;  Location: [facility] ENDOSCOPY;  Service: Cardiovascular;  Laterality: N/A;   TEE WITHOUT CARDIOVERSION N/A 03/26/2014   Procedure: TRANSESOPHAGEAL ECHOCARDIOGRAM (TEE);  Surgeon: Wendall Stade, MD;  Location: [facility] ENDOSCOPY;  Service: Cardiovascular;  Laterality: N/A;   TONSILLECTOMY         Home Medications    Prior to Admission medications   Medication Sig Start Date End Date Taking? Authorizing Provider  promethazine-dextromethorphan (PROMETHAZINE-DM) 6.25-15 MG/5ML syrup Take 5 mLs by mouth 4 (four) times daily as needed. 05/01/23  Yes Particia Nearing, PA-C  albuterol (VENTOLIN HFA) 108 (90 Base) MCG/ACT inhaler Inhale 2 puffs into the lungs every 6 (six) hours as needed for wheezing or shortness of breath. 07/21/19   Moshe Cipro, FNP  atorvastatin (LIPITOR) 40 MG tablet Take 1 tablet (40 mg total) by mouth daily. 04/24/22 04/19/23  Mallipeddi, Vishnu P, MD  azelastine (ASTELIN) 0.1 % nasal spray Place 1 spray into both nostrils 2 (two) times daily as needed for rhinitis or allergies.    [provider]  blood glucose meter kit and supplies  Dispense based on patient and insurance preference. Use to check blood sugar once a day(FOR ICD-10 E10.9, E11.9). 02/16/18   Merlyn Albert, MD  Blood Glucose Monitoring Suppl (CONTOUR NEXT MONITOR) w/Device KIT as directed per package instructions twice a day 05/27/20   [provider]  budesonide-formoterol (SYMBICORT) 160-4.5 MCG/ACT inhaler 1 puff as needed Inhalation every 4 hrs 09/29/22   [provider]  cetirizine (ZYRTEC) 10 MG tablet Take 10 mg by mouth daily. 01/07/23   [provider]  Cholecalciferol (VITAMIN D) 2000 UNITS CAPS Take 4,000 Units by mouth daily.     [provider]  dofetilide (TIKOSYN) 500 MCG capsule TAKE 1 CAPSULE BY MOUTH TWICE  DAILY 03/25/23   Mallipeddi, Vishnu P, MD  enalapril  (VASOTEC) 10 MG tablet Take 1 tablet (10 mg total) by mouth 2 (two) times daily. 05/29/16   Allred, Fayrene Fearing, MD  fluticasone (FLONASE) 50 MCG/ACT nasal spray Place 1 spray into both nostrils daily as needed for allergies.     [provider]  Fluticasone-Salmeterol (ADVAIR DISKUS) 250-50 MCG/DOSE AEPB Inhale 1 puff into the lungs every 12 (twelve) hours. 07/29/17   Merlyn Albert, MD  furosemide (LASIX) 40 MG tablet Take 1 tablet (40 mg total) by mouth as needed. 06/11/21   Newman Nip, NP  glucose blood (CONTOUR NEXT TEST) test strip as directed per package instructions In Vitro twice a day 05/27/20   [provider]  hydrOXYzine (VISTARIL) 25 MG capsule Take 2 capsules (50 mg total) by mouth 3 (three) times daily as needed for anxiety. 07/11/16   Erick Blinks, MD  loratadine (CLARITIN) 10 MG tablet Take 10 mg by mouth daily as needed for allergies.     [provider]  Magnesium Oxide 250 MG TABS 1 tablet Orally Four times a day 02/02/23   [provider]  meloxicam (MOBIC) 15 MG tablet Take 15 mg by mouth daily as needed for pain. 02/01/22   [provider]  metFORMIN (GLUCOPHAGE) 1000 MG tablet TAKE 1 TABLET BY MOUTH TWICE  DAILY WITH MEALS 05/29/21   Everlene Other G, DO  metoprolol tartrate (LOPRESSOR) 50 MG tablet Take 50 mg by mouth daily. 06/03/19   [provider]  nitroGLYCERIN (NITROSTAT) 0.4 MG SL tablet DISSOLVE 1 TABLET UNDER THE TONGUE EVERY 5 MINUTES AS  NEEDED FOR CHEST PAIN. MAX  OF 3 TABLETS IN 15 MINUTES. CALL 911 IF PAIN PERSISTS. 05/30/20   Allred, Fayrene Fearing, MD  ondansetron (ZOFRAN) 4 MG tablet TAKE 1 TABLET BY MOUTH EVERY 8 HOURS AS NEEDED FOR NAUSEA AND VOMITING 04/08/23   Carlan, Chelsea L, NP  oxybutynin (DITROPAN) 5 MG tablet TAKE ONE-HALF TABLET (2.5mg ) BY  MOUTH TWO TIMES DAILY AS  NEEDED FOR BLADDER SPASMS 07/29/17   Merlyn Albert, MD  potassium chloride SA (KLOR-CON) 20 MEQ tablet TAKE 1 TABLET BY MOUTH  DAILY 01/24/21    Tommie Sams, DO  rivaroxaban (XARELTO) 20 MG TABS tablet Take 1 tablet (20 mg total) by mouth daily with supper. 02/24/23   Mansouraty, Netty Starring., MD  Simethicone 80 MG TABS Take 1 tablet (80 mg total) by mouth every 6 (six) hours as needed. 06/22/22   Carlan, Chelsea L, NP  vitamin B-12 (CYANOCOBALAMIN) 500 MCG tablet Take 500 mcg by mouth daily.    [provider]  Vonoprazan Fumarate (VOQUEZNA) 10 MG TABS Take 10 mg by mouth daily at 6 (six) AM. 11/23/22   Dolores Frame, MD  Family History Family History  Problem Relation Age of Onset   Diabetes Mother    Anemia Mother    Gout Mother    Kidney disease Mother    Obesity Mother    CAD Father 13   Diabetes Father    Heart attack Father    Transient ischemic attack Father    Vascular Disease Father    Heart disease Father    Hypertension Father    Obesity Father    Stroke Father    Diabetes Brother    Aneurysm Maternal Grandmother    Stroke Maternal Grandfather    Diabetes Paternal Grandmother    Stroke Paternal Grandfather     Social History Social History   Tobacco Use   Smoking status: Former    Current packs/day: 0.00    Average packs/day: 1 pack/day for 7.2 years (7.2 ttl pk-yrs)    Types: Cigarettes    Start date: 05/19/2003    Quit date: 08/11/2012    Years since quitting: 10.7   Smokeless tobacco: Never  Vaping Use   Vaping status: Never Used  Substance Use Topics   Alcohol use: No   Drug use: No     Allergies   Patient has no known allergies.   Review of Systems Review of Systems Per HPI  Physical Exam Triage Vital Signs ED Triage Vitals [05/01/23 1030]  Encounter Vitals Group     BP 124/79     Systolic BP Percentile      Diastolic BP Percentile      Pulse Rate 67     Resp 18     Temp 98.3 F (36.8 C)     Temp Source Oral     SpO2 98 %     Weight      Height      Head Circumference      Peak Flow      Pain Score 4     Pain Loc      Pain Education      Exclude  from Growth Chart    No data found.  Updated Vital Signs BP 124/79 (BP Location: Right Arm)   Pulse 67   Temp 98.3 F (36.8 C) (Oral)   Resp 18   SpO2 98%   Visual Acuity Right Eye Distance:   Left Eye Distance:   Bilateral Distance:    Right Eye Near:   Left Eye Near:    Bilateral Near:     Physical Exam Vitals and nursing note reviewed.  Constitutional:      Appearance: He is well-developed.  HENT:     Head: Atraumatic.     Right Ear: External ear normal.     Left Ear: External ear normal.     Nose: Rhinorrhea present.     Mouth/Throat:     Pharynx: Posterior oropharyngeal erythema present. No oropharyngeal exudate.  Eyes:     Conjunctiva/sclera: Conjunctivae normal.     Pupils: Pupils are equal, round, and reactive to light.  Cardiovascular:     Rate and Rhythm: Normal rate.  Pulmonary:     Effort: Pulmonary effort is normal. No respiratory distress.     Breath sounds: No wheezing or rales.  Musculoskeletal:        General: Normal range of motion.     Cervical back: Normal range of motion and neck supple.  Lymphadenopathy:     Cervical: No cervical adenopathy.  Skin:    General: Skin is warm and dry.  Neurological:  Mental Status: He is alert and oriented to person, place, and time.  Psychiatric:        Behavior: Behavior normal.      UC Treatments / Results  Labs (all labs ordered are listed, but only abnormal results are displayed) Labs Reviewed - No data to display  EKG   Radiology No results found.  Procedures Procedures (including critical care time)  Medications Ordered in UC Medications  dexamethasone (DECADRON) injection 10 mg (10 mg Intramuscular Given 05/01/23 1139)    Initial Impression / Assessment and Plan / UC Course  I have reviewed the triage vital signs and the nursing notes.  Pertinent labs & imaging results that were available during my care of the patient were reviewed by me and considered in my medical decision  making (see chart for details).     Suspect viral respiratory infection causing an asthma exacerbation.  Treat with IM Decadron, Phenergan DM, continued asthma and allergy regimen, supportive over-the-counter medications and home care.  Return for worsening symptoms. Final Clinical Impressions(s) / UC Diagnoses   Final diagnoses:  Viral URI with cough  Moderate persistent asthma with acute exacerbation     Discharge Instructions      We have given you a steroid injection today and I have prescribed a cough syrup.  Continue your allergy and asthma regimen and you may also take Coricidin HBP, plain Mucinex, Flonase, and use sinus rinses and humidifiers.  Follow-up for worsening symptoms.    ED Prescriptions     Medication Sig Dispense Auth. Provider   promethazine-dextromethorphan (PROMETHAZINE-DM) 6.25-15 MG/5ML syrup Take 5 mLs by mouth 4 (four) times daily as needed. 100 mL Particia Nearing, [place]      PDMP not reviewed this encounter.   Particia Nearing, [place] 05/01/23 1221

## 2023-05-01 NOTE — Discharge Instructions (Signed)
We have given you a steroid injection today and I have prescribed a cough syrup.  Continue your allergy and asthma regimen and you may also take Coricidin HBP, plain Mucinex, Flonase, and use sinus rinses and humidifiers.  Follow-up for worsening symptoms.

## 2023-05-01 NOTE — ED Triage Notes (Signed)
Pt reports he has chest congestion, cough, back spasms, "green snot", and eyes watering x 3 days

## 2023-05-04 ENCOUNTER — Other Ambulatory Visit (INDEPENDENT_AMBULATORY_CARE_PROVIDER_SITE_OTHER): Payer: Self-pay | Admitting: *Deleted

## 2023-05-04 DIAGNOSIS — R110 Nausea: Secondary | ICD-10-CM

## 2023-05-04 DIAGNOSIS — R1010 Upper abdominal pain, unspecified: Secondary | ICD-10-CM

## 2023-05-04 DIAGNOSIS — K219 Gastro-esophageal reflux disease without esophagitis: Secondary | ICD-10-CM

## 2023-05-04 DIAGNOSIS — R140 Abdominal distension (gaseous): Secondary | ICD-10-CM

## 2023-05-04 MED ORDER — VOQUEZNA 10 MG PO TABS: 10.0000 mg | ORAL_TABLET | Freq: Every day | ORAL | 5 refills | Status: DC

## 2023-05-25 ENCOUNTER — Other Ambulatory Visit (INDEPENDENT_AMBULATORY_CARE_PROVIDER_SITE_OTHER): Payer: Self-pay | Admitting: Gastroenterology

## 2023-05-25 DIAGNOSIS — Z13220 Encounter for screening for lipoid disorders: Secondary | ICD-10-CM | POA: Diagnosis not present

## 2023-05-25 DIAGNOSIS — Z1329 Encounter for screening for other suspected endocrine disorder: Secondary | ICD-10-CM | POA: Diagnosis not present

## 2023-05-25 DIAGNOSIS — E119 Type 2 diabetes mellitus without complications: Secondary | ICD-10-CM | POA: Diagnosis not present

## 2023-05-25 NOTE — Telephone Encounter (Signed)
Last seen 06/22/22

## 2023-06-01 ENCOUNTER — Encounter (INDEPENDENT_AMBULATORY_CARE_PROVIDER_SITE_OTHER): Payer: Self-pay

## 2023-06-01 ENCOUNTER — Ambulatory Visit (INDEPENDENT_AMBULATORY_CARE_PROVIDER_SITE_OTHER): Payer: BC Managed Care – PPO | Admitting: Gastroenterology

## 2023-06-10 DIAGNOSIS — R7989 Other specified abnormal findings of blood chemistry: Secondary | ICD-10-CM | POA: Diagnosis not present

## 2023-06-10 DIAGNOSIS — Z6832 Body mass index (BMI) 32.0-32.9, adult: Secondary | ICD-10-CM | POA: Diagnosis not present

## 2023-06-10 DIAGNOSIS — J4520 Mild intermittent asthma, uncomplicated: Secondary | ICD-10-CM | POA: Diagnosis not present

## 2023-06-10 DIAGNOSIS — G4730 Sleep apnea, unspecified: Secondary | ICD-10-CM | POA: Diagnosis not present

## 2023-06-10 DIAGNOSIS — Z20828 Contact with and (suspected) exposure to other viral communicable diseases: Secondary | ICD-10-CM | POA: Diagnosis not present

## 2023-06-10 DIAGNOSIS — E119 Type 2 diabetes mellitus without complications: Secondary | ICD-10-CM | POA: Diagnosis not present

## 2023-06-10 DIAGNOSIS — K219 Gastro-esophageal reflux disease without esophagitis: Secondary | ICD-10-CM | POA: Diagnosis not present

## 2023-06-11 ENCOUNTER — Ambulatory Visit: Payer: BC Managed Care – PPO | Attending: Cardiovascular Disease | Admitting: Cardiovascular Disease

## 2023-06-11 ENCOUNTER — Encounter: Payer: Self-pay | Admitting: Cardiovascular Disease

## 2023-06-11 VITALS — BP 122/64 | HR 63 | Ht 73.0 in | Wt 245.0 lb

## 2023-06-11 DIAGNOSIS — I4819 Other persistent atrial fibrillation: Secondary | ICD-10-CM | POA: Diagnosis not present

## 2023-06-11 NOTE — Patient Instructions (Addendum)
 Medication Instructions:  Continue all current medications.  Labwork: none  Testing/Procedures: none  Follow-Up: 3 months   Any Other Special Instructions Will Be Listed Below (If Applicable).  If you need a refill on your cardiac medications before your next appointment, please call your pharmacy.

## 2023-06-11 NOTE — Progress Notes (Signed)
  Electrophysiology Office Note:    Date:  06/11/2023   ID:  DEMAURION DICIOCCIO, DOB 1965-07-27, MRN [id number]  PCP:  Antonietta Jewel   [facility] Providers Cardiologist:  Marjo Bicker, MD Electrophysiologist:  Maurice Small, MD     Referring MD: Dion Saucier, PA-C   History of Present Illness:    Austin Woods is a 58 y.o. male with a medical history significant for persistent atrial fibrillation, obesity, obstructive sleep apnea, who presents for electrophysiology follow-up.     Patient has a history of atrial fibrillation and has undergone ablation on 2 occasions.  He has since been started on Tikosyn and has been maintaining sinus rhythm.  Recalls that he was taken off Tikosyn for brief period in the past but immediately had recurrence of atrial fibrillation.  He is symptomatic with rapid rates and palpitations when he does have atrial fibrillation.      Today, he reports that he is doing very well from a rhythm standpoint.  He has not had any recurrence of rapid rates or palpitations while on Tikosyn.  EKGs/Labs/Other Studies Reviewed Today:    Echocardiogram:  TTE June 05, 2020 LVEF 60 to 65%.  Left atrium normal in size   Monitors:  01/14/2023   Patch wear time was for 3 days and 14 hours.   Normal sinus rhythm predominantly ranging from 45 to 162 bpm with an average HR 70 bpm.   2 brief runs of SVT, fastest lasting 1.8 seconds and the longest lasting 4.3 seconds.  No ventricular arrhythmias.   No AV block or pauses.   4.8 % PAC burden and <1% PVC burden.   No patient triggered events.  Stress testing:  Reviewed in Epic  Advanced imaging:   Cardiac catherization   EKG:   EKG Interpretation Date/Time:  Friday June 11 2023 13:20:48 EST Ventricular Rate:  63 PR Interval:  144 QRS Duration:  88 QT Interval:  472 QTC Calculation: 483 R Axis:   -2  Text Interpretation: Sinus rhythm with Premature atrial complexes Prolonged  QT When compared with ECG of 11-Dec-2022 14:53, Premature atrial complexes are now Present Confirmed by York Pellant [phone]) on 06/11/2023 1:31:08 PM     Physical Exam:    VS:  BP 122/64   Pulse 63   Ht 6\' 1"  (1.854 m)   Wt 245 lb (111.1 kg)   SpO2 97%   BMI 32.32 kg/m     Wt Readings from Last 3 Encounters:  06/11/23 245 lb (111.1 kg)  03/24/23 246 lb 6.4 oz (111.8 kg)  02/22/23 238 lb (108 kg)     GEN: Well nourished, well developed in no acute distress CARDIAC: RRR, no murmurs, rubs, gallops RESPIRATORY:  Normal work of breathing MUSCULOSKELETAL: no edema    ASSESSMENT & PLAN:    Paroxysmal atrial fibrillation Status post AF ablation x2 Currently on Tikosyn, maintaining sinus rhythm   High risk medication-Tikosyn On 500 mcg twice daily EKG reviewed today; QTc is within acceptable limits CMP 06/10/23 reviewed today -- Cr elevated -- repeat check is pending. We will follow-up with him next week to ensure the renal function is stable If his renal function is worsening, we will need to consider reducing the dose; ECG is ok today.  Secondary hypercoagulable state Continue Xarelto 20 mg with evening meal      Signed, Maurice Small, MD  06/11/2023 1:53 PM    [facility]

## 2023-06-15 ENCOUNTER — Telehealth: Payer: Self-pay | Admitting: *Deleted

## 2023-06-15 DIAGNOSIS — Z79899 Other long term (current) drug therapy: Secondary | ICD-10-CM

## 2023-06-15 DIAGNOSIS — I10 Essential (primary) hypertension: Secondary | ICD-10-CM

## 2023-06-15 DIAGNOSIS — R7989 Other specified abnormal findings of blood chemistry: Secondary | ICD-10-CM

## 2023-06-15 NOTE — Telephone Encounter (Signed)
 Please see labs dated 06/10/2023 (CM[facility]) - will see under "[facility]" tab - labs from pcp.  Not sure if you can see that tab or not.  Creatinine - 1.71  eGFR - 45

## 2023-06-17 NOTE — Telephone Encounter (Signed)
 Left message to return call

## 2023-06-24 ENCOUNTER — Other Ambulatory Visit (INDEPENDENT_AMBULATORY_CARE_PROVIDER_SITE_OTHER): Payer: Self-pay | Admitting: Gastroenterology

## 2023-06-24 NOTE — Telephone Encounter (Signed)
 We will refill today,but patient will need an office visit for any further refills.

## 2023-06-24 NOTE — Telephone Encounter (Signed)
 Left message to return call

## 2023-07-02 ENCOUNTER — Encounter: Payer: Self-pay | Admitting: *Deleted

## 2023-07-02 NOTE — Addendum Note (Signed)
 Addended by: Lesle Chris on: 07/02/2023 03:17 PM   Modules accepted: Orders

## 2023-07-02 NOTE — Telephone Encounter (Signed)
 Left message to return call x 3.  Will send mychart message as well.

## 2023-07-02 NOTE — Telephone Encounter (Signed)
 Per Dr. Nelly Laurence OV on 06/11/23 -   High risk medication-Tikosyn On 500 mcg twice daily EKG reviewed today; QTc is within acceptable limits CMP 06/10/23 reviewed today -- Cr elevated -- repeat check is pending. We will follow-up with him next week to ensure the renal function is stable If his renal function is worsening, we will need to consider reducing the dose; ECG is ok today.   Spoke with patient in regards to labs - stated that he had his labs the day before [facility] visit at his pcp office.  This lab was the CMP from 06/10/2023 - no recheck pending from his pcp.   Will give him order for BMET - he will come by one day next week to pick up order & do a current lab.

## 2023-07-07 DIAGNOSIS — R519 Headache, unspecified: Secondary | ICD-10-CM | POA: Diagnosis not present

## 2023-07-07 DIAGNOSIS — J329 Chronic sinusitis, unspecified: Secondary | ICD-10-CM | POA: Diagnosis not present

## 2023-07-07 DIAGNOSIS — R110 Nausea: Secondary | ICD-10-CM | POA: Diagnosis not present

## 2023-07-07 DIAGNOSIS — Z6832 Body mass index (BMI) 32.0-32.9, adult: Secondary | ICD-10-CM | POA: Diagnosis not present

## 2023-07-07 DIAGNOSIS — Z20828 Contact with and (suspected) exposure to other viral communicable diseases: Secondary | ICD-10-CM | POA: Diagnosis not present

## 2023-07-20 ENCOUNTER — Ambulatory Visit (INDEPENDENT_AMBULATORY_CARE_PROVIDER_SITE_OTHER): Payer: BC Managed Care – PPO | Admitting: Gastroenterology

## 2023-07-20 ENCOUNTER — Encounter (INDEPENDENT_AMBULATORY_CARE_PROVIDER_SITE_OTHER): Payer: Self-pay | Admitting: Gastroenterology

## 2023-07-20 VITALS — BP 116/62 | HR 64 | Temp 97.5°F | Ht 73.0 in | Wt 244.0 lb

## 2023-07-20 DIAGNOSIS — R7989 Other specified abnormal findings of blood chemistry: Secondary | ICD-10-CM | POA: Diagnosis not present

## 2023-07-20 DIAGNOSIS — K219 Gastro-esophageal reflux disease without esophagitis: Secondary | ICD-10-CM | POA: Diagnosis not present

## 2023-07-20 DIAGNOSIS — Z85020 Personal history of malignant carcinoid tumor of stomach: Secondary | ICD-10-CM

## 2023-07-20 DIAGNOSIS — D490 Neoplasm of unspecified behavior of digestive system: Secondary | ICD-10-CM

## 2023-07-20 DIAGNOSIS — Z87891 Personal history of nicotine dependence: Secondary | ICD-10-CM

## 2023-07-20 DIAGNOSIS — I10 Essential (primary) hypertension: Secondary | ICD-10-CM | POA: Diagnosis not present

## 2023-07-20 DIAGNOSIS — Z79899 Other long term (current) drug therapy: Secondary | ICD-10-CM | POA: Diagnosis not present

## 2023-07-20 MED ORDER — DEXLANSOPRAZOLE 60 MG PO CPDR: 60.0000 mg | DELAYED_RELEASE_CAPSULE | Freq: Every day | ORAL | 1 refills | Status: DC

## 2023-07-20 NOTE — Progress Notes (Addendum)
 Referring Provider: Antonietta Jewel Primary Care Physician:  Antonietta Jewel Primary GI Physician: Dr. Levon Hedger   Chief Complaint  Patient presents with   Follow-up    Patient here today for a follow up on his Austin Woods. Patient is taking voquezna 10 mg daily. This is not controlling his symptoms. He has tried pantoprazole,Esomeprazole, famotidine,omeprazole. Has not tried dexlansoprazole,zegrid, nor lansoprazole. He is not able to do TIF, per patient.   HPI:   Austin Woods is a 58 y.o. male with past medical history of A-fib, asthma, iron-deficiency anemia, GERD s/p Nissen fundoplication, hypertension, diabetes, Wolff-Parkinson-White status post ablation, coronary artery disease, OSA  Patient presenting today for:  Follow up of GERD  Last seen march 2024, at that time patient having nausea since changing to voquezna. Appetite not great.  Some bloating early satiety.  Having heartburn/regurgitation daily.  Ran out of Voquezna for 2 weeks and took protonix In the meantime. Also having diarrhea with every meal   Recommended to keep schedule EUS in April, continue voquezna 10mg  daily, simethicone 80mg  daily for gas, zofran PRN for nausea  EGD/EUS in April 2024, no gross lesions, 3 submucosal papules in atnerior portion of stomach, antral gastropathy and gastritis, no lesion in duodenum, EUS no overt lesions seen..  Patient advised to continue voquezna, start carafate, repeat EUS 1 year  Was also advised to have PET dotate scan and referall to gen surg for hernia repair   PET 10/12/22  Increased radiotracer uptake is identified within the wall of the proximal stomach just below the GE junction, nonspecific. Correlate with endoscopic findings. No signs of tracer avid nodal metastasis or solid organ metastasis. New indeterminate nodule within the right lung base without corresponding increased tracer uptake measures 1.0 cm. Recommend follow-up imaging in 3 months with repeat  diagnostic CT of the chest to confirm persistence.  Increased radiotracer activity within the prostate gland is identified with SUV max of 8.70. Recommend correlation with PSA values.  Advised to have repeat EUS after PET  EGD/EUS 02/12/23: EGD Impression:  - No gross mucosal lesions in the entire esophagus. Tortuous esophagus distally. Z-line regular, 41 cm   from the incisors. - A Nissen fundoplication was found, characterized by a non-intact appearance. - Three gastric polyps. Resected and retrieved. Clips (MR conditional) were placed.  - Erosive gastritis in antrum. - Granular gastric mucosa throughout. Biopsied. - Hemoclip on anterior body from prior resection. - No gross lesions in the duodenal bulb, in the first portion of the duodenum and in the second  portion of the duodenum. EUS Impression:  - Endosonographic images of the stomach were unremarkable. - No malignant-appearing lymph nodes were    visualized in the celiac region (level 20),  peripancreatic region and porta hepatis region. A. STOMACH, CARDIA, GREATER CURVE, POSTERIOR, POLYPECTOMY:  -  Antral/oxyntic type mucosa with foveolar hyperplasia and lamina  propria edema consistent with- Hyperplastic polyp.  - No dysplasia or malignancy.  In the background of mild to moderate  chronic inactive gastritis and linear and nodular neuroendocrine cell  hyperplasia.  B. STOMACH, RANDOM, BIOPSY:  -  Antral/oxyntic mucosa with chemical/reactive gastropathy and mild to  moderate chronic inactive gastritis with mild linear and focal nodular  neuroendocrine cell hyperplasia and evidence of glandular dropout  suggestive of atrophy.   Patient saw oncology in December who recommended repeat PET dotate, gastrin and chromgranin again in 6 months  Repeat EUS 1 year (nov 2025)  Present:  Doing voquezna 10mg   daily, does not feel this is doing much for him. He has daily acid regurgitation. He notes that he thinks this is due to previous  NF wrap being undone. Denies dysphagia. He has a frequent cough but also thinks pollen is contributing to this. Appetite is okay, he tries to eat when he is hungry. He has some nausea but no vomiting due to previous wrap. He is taking gas pills PRN. Has had some diminished enamel on his teeth which he thinks is secondary to his GERD.   He has upcoming labs/imaging again with Dr. Kirtland Bouchard in June. States he was told he could have NF repaired by Oncology.   No red flag symptoms. Patient denies melena, hematochezia, vomiting, diarrhea, constipation, dysphagia, odyonophagia, early satiety or weight loss.   CT A/P 05/20/22: No acute intra-abdominal or pelvic pathology. No evidence of metastatic disease in the abdomen or pelvis. 2. Possible mild gastric pneumatosis of indeterminate etiology and may be related to recent instrumentation. No inflammatory changes or gastric wall thickening. No free air or portal venous gas. 3. Fatty liver.   Last Colonoscopy: 04/2019 - Perianal skin tags found on perianal exam. - The examined portion of the ileum was normal. - Two diminutive polyps at the splenic flexure and in the ascending colon. Biopsied. - Diverticulosis at the hepatic flexure. - External hemorrhoids.   Path: COLON, SPLENIC FLEXURE, ASCENDING, POLYPECTOMY:  - Tubular adenoma without high-grade dysplasia or malignancy  - Other fragment of polypoid colonic mucosa with no specific  histopathologic changes    Recommended repeat in 7 years.   Last Endoscopy: - 1 cm hiatal hernia.                           - Nissen fundoplication was found, characterized by                            loose wrap.                           - Gastroesophageal flap valve classified as Hill                            Grade II (fold present, opens with respiration).                           - A single mucosal papule (nodule) found in the                            stomach. Query GIST? Biopsied-neuroendocrine tumor                            - Normal examined duodenum.   Filed Weights   07/20/23 1040  Weight: 244 lb (110.7 kg)     Past Medical History:  Diagnosis Date   A-fib (HCC)    Allergy    All my life   Anemia    iron def anemia   Arthritis    Asthma    Coronary artery disease    Dysrhythmia    in and out Afib   GERD (gastroesophageal reflux disease)    Headache    High cholesterol    History of DVT (deep vein thrombosis)  a. left leg following ablation for SVT   Hypertension    Persistent atrial fibrillation (HCC)    a. Dx 08/2012, xarelto initiated; b. 09/2012 Tikosyn initiated -> DCCV -> Sinus c. PVI 03/2013 d. 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   Type 2 diabetes mellitus without complication, without long-term current use of insulin (HCC) 02/16/2018   Wolff-Parkinson-White (WPW) syndrome    resolved s/p RFCA 2001 by Dr Graciela Husbands    Past Surgical History:  Procedure Laterality Date   ATRIAL FIBRILLATION ABLATION  03/23/2013   PVI by DR Johney Frame for afib   ATRIAL FIBRILLATION ABLATION N/A 03/23/2013   Procedure: ATRIAL FIBRILLATION ABLATION;  Surgeon: Gardiner Rhyme, MD;  Location: [facility] CATH LAB;  Service: Cardiovascular;  Laterality: N/A;   ATRIAL FIBRILLATION ABLATION N/A 03/27/2014   PVI Dr Allyne Gee SURGERY     BIOPSY  02/28/2020   Procedure: BIOPSY;  Surgeon: Malissa Hippo, MD;  Location: [facility];  Service: Endoscopy;;  antral(pre-pyloric)    BIOPSY  04/16/2022   Procedure: BIOPSY;  Surgeon: Dolores Frame, MD;  [facility]: [facility];  Service: Gastroenterology;;   BIOPSY  07/16/2022   Procedure: BIOPSY;  Surgeon: Lemar Lofty., MD;  Location: [facility];  Service: Gastroenterology;;   BIOPSY  02/22/2023   Procedure: BIOPSY;  Surgeon: Lemar Lofty., MD;  Location: [facility]OSCOPY;  Service: Gastroenterology;;   CARDIAC CATHETERIZATION N/A 02/03/2016   Procedure: Left Heart Cath and Coronary  Angiography;  Surgeon: Yvonne Kendall, MD;  Location: [facility] INVASIVE CV LAB;  Service: Cardiovascular;  Laterality: N/A;   CARDIAC CATHETERIZATION N/A 02/03/2016   Procedure: Intravascular Pressure Wire/FFR Study;  Surgeon: Yvonne Kendall, MD;  Location: [facility] INVASIVE CV LAB;  Service: Cardiovascular;  Laterality: N/A;   CARDIAC CATHETERIZATION N/A 02/03/2016   Procedure: Coronary Stent Intervention;  Surgeon: Yvonne Kendall, MD;  Location: [facility] INVASIVE CV LAB;  Service: Cardiovascular;  Laterality: N/A;   CARDIAC SURGERY     CARDIOVERSION N/A 08/25/2012   Procedure: CARDIOVERSION;  Surgeon: Vesta Mixer, MD;  Location: [facility] ENDOSCOPY;  Service: Cardiovascular;  Laterality: N/A;   CARDIOVERSION N/A 09/06/2012   Procedure: CARDIOVERSION/Bedside;  Surgeon: Luis Abed, MD;  Location: [facility] OR;  Service: Cardiovascular;  Laterality: N/A;   CARDIOVERSION N/A 01/12/2014   Procedure: CARDIOVERSION;  Surgeon: Chrystie Nose, MD;  Location: [facility] ENDOSCOPY;  Service: Cardiovascular;  Laterality: N/A;   CARDIOVERSION N/A 01/30/2016   Procedure: CARDIOVERSION;  Surgeon: Quintella Reichert, MD;  Location: [facility] ENDOSCOPY;  Service: Cardiovascular;  Laterality: N/A;   CARDIOVERSION N/A 04/14/2016   Procedure: CARDIOVERSION;  Surgeon: Chrystie Nose, MD;  Location: [facility] ENDOSCOPY;  Service: Cardiovascular;  Laterality: N/A;   COLONOSCOPY N/A 12/04/2015   Procedure: COLONOSCOPY;  Surgeon: Malissa Hippo, MD;  [facility];  Service: Endoscopy;  Laterality: N/A;  730   COLONOSCOPY WITH PROPOFOL N/A 04/28/2019   Procedure: COLONOSCOPY WITH PROPOFOL;  Surgeon: Malissa Hippo, MD;  [facility];  Service: Endoscopy;  Laterality: N/A;   CORONARY ANGIOPLASTY     CORONARY ANGIOPLASTY WITH STENT PLACEMENT     ELECTROPHYSIOLOGIC STUDY N/A 02/18/2016   Procedure: Atrial Fibrillation Ablation;  Surgeon: Hillis Range, MD;  Location: [facility] INVASIVE CV LAB;  Service: Cardiovascular;  Laterality: N/A;    ENDOSCOPIC MUCOSAL RESECTION N/A 07/16/2022   Procedure: ENDOSCOPIC MUCOSAL RESECTION;  Surgeon: Meridee Score Netty Starring., MD;  Location:  [facility] ENDOSCOPY;  Service: Gastroenterology;  Laterality: N/A;   ENDOSCOPIC MUCOSAL RESECTION  02/22/2023   Procedure: ENDOSCOPIC MUCOSAL RESECTION;  Surgeon: Brice Campi Albino Alu., MD;  Location: [facility] ENDOSCOPY;  Service: Gastroenterology;;   ESOPHAGOGASTRODUODENOSCOPY N/A 02/22/2023   Procedure: ESOPHAGOGASTRODUODENOSCOPY (EGD);  Surgeon: Normie Becton., MD;  Location: [facility];  Service: Gastroenterology;  Laterality: N/A;   ESOPHAGOGASTRODUODENOSCOPY (EGD) WITH PROPOFOL N/A 04/28/2019   Procedure: ESOPHAGOGASTRODUODENOSCOPY (EGD) WITH PROPOFOL;  Surgeon: Ruby Corporal, MD;  [facility];  Service: Endoscopy;  Laterality: N/A;  7:30   ESOPHAGOGASTRODUODENOSCOPY (EGD) WITH PROPOFOL N/A 02/28/2020   Procedure: ESOPHAGOGASTRODUODENOSCOPY (EGD) WITH PROPOFOL;  Surgeon: Ruby Corporal, MD;  [facility];  Service: Endoscopy;  Laterality: N/A;  1245   ESOPHAGOGASTRODUODENOSCOPY (EGD) WITH PROPOFOL N/A 04/16/2022   Procedure: ESOPHAGOGASTRODUODENOSCOPY (EGD) WITH PROPOFOL;  Surgeon: Urban Garden, MD;  [facility];  Service: Gastroenterology;  Laterality: N/A;  730am, asa 1-2   ESOPHAGOGASTRODUODENOSCOPY (EGD) WITH PROPOFOL N/A 07/16/2022   Procedure: ESOPHAGOGASTRODUODENOSCOPY (EGD) WITH PROPOFOL;  Surgeon: Brice Campi Albino Alu., MD;  Location: [facility] ENDOSCOPY;  Service: Gastroenterology;  Laterality: N/A;   EUS N/A 07/16/2022   Procedure: UPPER ENDOSCOPIC ULTRASOUND (EUS) RADIAL;  Surgeon: Normie Becton., MD;  Location: [facility] ENDOSCOPY;  Service: Gastroenterology;  Laterality: N/A;   EUS N/A 02/22/2023   Procedure: UPPER ENDOSCOPIC ULTRASOUND (EUS) RADIAL;  Surgeon: Normie Becton., MD;  Location: [facility] ENDOSCOPY;  Service: Gastroenterology;  Laterality: N/A;   HEMOSTASIS CLIP PLACEMENT   07/16/2022   Procedure: HEMOSTASIS CLIP PLACEMENT;  Surgeon: Normie Becton., MD;  Location: [facility]OS[facility];  Service: Gastroenterology;;   HEMOSTASIS CLIP PLACEMENT  02/22/2023   Procedure: HEMOSTASIS CLIP PLACEMENT;  Surgeon: Normie Becton., MD;  Location: [facility] ENDOSCOPY;  Service: Gastroenterology;;   HERNIA REPAIR     10 years   implantable loop recorder removal  03/01/2019   MDT Reveal LINQ removed in office   INGUINAL HERNIA REPAIR Bilateral 06/04/2017   Procedure: OPEN BILATERAL INGUINAL HERNIA REPAIR;  Surgeon: Jerryl Morin, MD;  Location: [facility] OR;  Service: General;  Laterality: Bilateral;   LOOP RECORDER IMPLANT N/A 06/29/2014   Procedure: LOOP RECORDER IMPLANT;  Surgeon: Jolly Needle, MD;  Location: [facility] CATH LAB;  Service: Cardiovascular;  Laterality: N/A;   NOSE SURGERY     sleep apnea surgery   PERIPHERAL VASCULAR CATHETERIZATION N/A 03/13/2016   Procedure: Thrombin Injection;  Surgeon: Margherita Shell, MD;  Location: [facility] INVASIVE CV LAB;  Service: Cardiovascular;  Laterality: N/A;   POLYPECTOMY  04/28/2019   Procedure: POLYPECTOMY;  Surgeon: Ruby Corporal, MD;  [facility]: [facility];  Service: Endoscopy;;  duodenum;splenic flexure;   SPINE SURGERY     10 years agao   STOMACH SURGERY     reflux, fundoplication   SUBMUCOSAL LIFTING INJECTION  07/16/2022   Procedure: SUBMUCOSAL LIFTING INJECTION;  Surgeon: Normie Becton., MD;  Location: [facility];  Service: Gastroenterology;;   SUBMUCOSAL LIFTING INJECTION  02/22/2023   Procedure: SUBMUCOSAL LIFTING INJECTION;  Surgeon: Normie Becton., MD;  Location: [facility] ENDOSCOPY;  Service: Gastroenterology;;   TEE WITHOUT CARDIOVERSION N/A 08/25/2012   Procedure: TRANSESOPHAGEAL ECHOCARDIOGRAM (TEE);  Surgeon: Lake Pilgrim, MD;  Location: [facility] ENDOSCOPY;  Service: Cardiovascular;  Laterality: N/A;   TEE WITHOUT CARDIOVERSION  03/23/2013   DR ROSS   TEE WITHOUT CARDIOVERSION N/A 03/23/2013   Procedure:  TRANSESOPHAGEAL ECHOCARDIOGRAM (TEE);  Surgeon: Elmyra Haggard, MD;  Location: [facility] ENDOSCOPY;  Service: Cardiovascular;  Laterality:  N/A;   TEE WITHOUT CARDIOVERSION N/A 03/26/2014   Procedure: TRANSESOPHAGEAL ECHOCARDIOGRAM (TEE);  Surgeon: Wendall Stade, MD;  Location: [facility] ENDOSCOPY;  Service: Cardiovascular;  Laterality: N/A;   TONSILLECTOMY      Current Outpatient Medications  Medication Sig Dispense Refill   albuterol (VENTOLIN HFA) 108 (90 Base) MCG/ACT inhaler Inhale 2 puffs into the lungs every 6 (six) hours as needed for wheezing or shortness of breath. 8 g 0   atorvastatin (LIPITOR) 40 MG tablet Take 1 tablet (40 mg total) by mouth daily. 90 tablet 3   azelastine (ASTELIN) 0.1 % nasal spray Place 1 spray into both nostrils 2 (two) times daily as needed for rhinitis or allergies.     blood glucose meter kit and supplies Dispense based on patient and insurance preference. Use to check blood sugar once a day(FOR ICD-10 E10.9, E11.9). 1 each 0   Blood Glucose Monitoring Suppl (CONTOUR NEXT MONITOR) w/Device KIT as directed per package instructions twice a day     budesonide-formoterol (SYMBICORT) 160-4.5 MCG/ACT inhaler 1 puff as needed Inhalation every 4 hrs     cetirizine (ZYRTEC) 10 MG tablet Take 10 mg by mouth daily.     Cholecalciferol (VITAMIN D) 2000 UNITS CAPS Take 4,000 Units by mouth daily.      dofetilide (TIKOSYN) 500 MCG capsule TAKE 1 CAPSULE BY MOUTH TWICE  DAILY 180 capsule 3   enalapril (VASOTEC) 10 MG tablet Take 1 tablet (10 mg total) by mouth 2 (two) times daily. 180 tablet 3   fluticasone (FLONASE) 50 MCG/ACT nasal spray Place 1 spray into both nostrils daily as needed for allergies.      Fluticasone-Salmeterol (ADVAIR DISKUS) 250-50 MCG/DOSE AEPB Inhale 1 puff into the lungs every 12 (twelve) hours. 60 each 0   furosemide (LASIX) 40 MG tablet Take 1 tablet (40 mg total) by mouth as needed.     glipiZIDE (GLUCOTROL XL) 5 MG 24 hr tablet Take 5 mg by mouth daily.      glucose blood (CONTOUR NEXT TEST) test strip as directed per package instructions In Vitro twice a day     hydrOXYzine (VISTARIL) 25 MG capsule Take 2 capsules (50 mg total) by mouth 3 (three) times daily as needed for anxiety. 30 capsule 0   loratadine (CLARITIN) 10 MG tablet Take 10 mg by mouth daily as needed for allergies.      Magnesium Oxide 250 MG TABS 1 tablet Orally Four times a day     meloxicam (MOBIC) 15 MG tablet Take 15 mg by mouth daily as needed for pain.     metFORMIN (GLUCOPHAGE) 1000 MG tablet TAKE 1 TABLET BY MOUTH TWICE  DAILY WITH MEALS 30 tablet 0   metoprolol tartrate (LOPRESSOR) 50 MG tablet Take 50 mg by mouth daily.     nitroGLYCERIN (NITROSTAT) 0.4 MG SL tablet DISSOLVE 1 TABLET UNDER THE TONGUE EVERY 5 MINUTES AS  NEEDED FOR CHEST PAIN. MAX  OF 3 TABLETS IN 15 MINUTES. CALL 911 IF PAIN PERSISTS. 25 tablet 1   ondansetron (ZOFRAN) 4 MG tablet TAKE 1 TABLET BY MOUTH EVERY 8 HOURS AS NEEDED FOR NAUSEA AND VOMITING 30 tablet 0   oxybutynin (DITROPAN) 5 MG tablet TAKE ONE-HALF TABLET (2.5mg ) BY  MOUTH TWO TIMES DAILY AS  NEEDED FOR BLADDER SPASMS 30 tablet 5   potassium chloride SA (KLOR-CON) 20 MEQ tablet TAKE 1 TABLET BY MOUTH  DAILY 90 tablet 3   rivaroxaban (XARELTO) 20 MG TABS tablet Take 1 tablet (20  mg total) by mouth daily with supper.     Simethicone 80 MG TABS Take 1 tablet (80 mg total) by mouth every 6 (six) hours as needed. 60 tablet 1   vitamin B-12 (CYANOCOBALAMIN) 500 MCG tablet Take 500 mcg by mouth daily.     Vonoprazan Fumarate (VOQUEZNA) 10 MG TABS Take 10 mg by mouth daily at 6 (six) AM. 30 tablet 5   No current facility-administered medications for this visit.    Allergies as of 07/20/2023   (No Known Allergies)    Social History   Socioeconomic History   Marital status: Married    Spouse name: Not on file   Number of children: 2   Years of education: Not on file   Highest education level: Not on file  Occupational History    Employer:  [employer]  Tobacco Use   Smoking status: Former    Current packs/day: 0.00    Average packs/day: 1 pack/day for 7.2 years (7.2 ttl pk-yrs)    Types: Cigarettes    Start date: 05/19/2003    Quit date: 08/11/2012    Years since quitting: 10.9   Smokeless tobacco: Never  Vaping Use   Vaping status: Never Used  Substance and Sexual Activity   Alcohol use: No   Drug use: No   Sexual activity: Yes    Birth control/protection: None  Other Topics Concern   Not on file  Social History Narrative   Pt lives in [place] with wife.  Works at [employer]: Not on [employer] Insecurity: Not on file  Transportation Needs: Not on file  Physical Activity: Not on file  Stress: Not on file  Social Connections: Not on file    Review of systems General: negative for malaise, night sweats, fever, chills, weight loss Neck: Negative for lumps, goiter, pain and significant neck swelling Resp: Negative for cough, wheezing, dyspnea at rest CV: Negative for chest pain, leg swelling, palpitations, orthopnea GI: denies melena, hematochezia, nausea, vomiting, diarrhea, constipation, dysphagia, odyonophagia, early satiety or unintentional weight loss. +nausea +GERD symptoms  The remainder of the review of systems is noncontributory.  Physical Exam: BP 116/62 (BP Location: Left Arm, Patient Position: Sitting, Cuff Size: Normal)   Pulse 64   Temp (!) 97.5 F (36.4 C) (Temporal)   Ht 6\' 1"  (1.854 m)   Wt 244 lb (110.7 kg)   BMI 32.19 kg/m  General:   Alert and oriented. No distress noted. Pleasant and cooperative.  Head:  Normocephalic and atraumatic. Eyes:  Conjuctiva clear without scleral icterus. Mouth:  Oral mucosa pink and moist. Good dentition. No lesions. Heart: Normal rate and rhythm, s1 and s2 heart sounds present.  Lungs: Clear lung sounds in all lobes. Respirations equal and unlabored. Abdomen:  +BS, soft, non-tender and non-distended. No  rebound or guarding. No HSM or masses noted. Derm: No palmar erythema or jaundice Msk:  Symmetrical without gross deformities. Normal posture. Extremities:  Without edema. Neurologic:  Alert and  oriented x4 Psych:  Alert and cooperative. Normal mood and affect.  Invalid input(s): "6 MONTHS"   ASSESSMENT: Austin Woods is a 58 y.o. male presenting today for follow up of GERD   Patient with history of [facility] location 20+ years ago, recent upper endoscopy with nonintact wrap noted.  He continues to have reflux symptoms without regurgitation and heartburn daily on voquezna 10mg  daily.  Has never really felt any improvement on voquezna.  He has tried failed multiple other PPIs in the past.  Recommend stopping the close not answering Dexilant 60 mg daily for now.  We did discuss referral back to general surgery for possible redo of Nissen fundoplication as he reports he was cleared by the eye oncology to have this done.  Previous procedure was done just, will discuss with Dr. Sammi Crick on his recommendations and referral to either surgery or [facility] for this.  Recommend good reflux precautions as well.  History of gastric carcinoid tumor, currently following with oncology and has repeat labs and imaging with him again in June.  Last EUS done in November without findings of ongoing malignancy.  He has had continued elevation of gastrin and chromogranin A levels.  Most recent PET scan in July 2024 showed increased uptake within the wall of proximal stomach prostate gland.  Was recommended to likely need repeat EUS around November 2025, will follow oncology recommendations regarding this as well.   PLAN:  -continue to follow with oncology -repeat EUS likely in November 2025 -stop voquezna -start dexilant 60 mg daily  -discuss referral for redo of NF with Dr. Sammi Crick  -good reflux precautions   All questions were answered, patient verbalized understanding and is in agreement with plan as outlined  above.   Follow Up: 4 months   Lorren Splawn L. Adrien Alberta, MSN, APRN, AGNP-C Adult-Gerontology Nurse Practitioner [facility]  I have reviewed the note and agree with the APP's assessment as described in this progress note  As he has had successful endoscopic management of observable type carcinoid, with most recent EUS without presence of mucosal/submucosal lesions, will proceed with referral for evaluation for re-do Nissen evaluation.  Samantha Cress, MD Gastroenterology and Hepatology [facility] Gastroenterology

## 2023-07-20 NOTE — Patient Instructions (Signed)
 Please stop voquezna Start dexilant 60mg  daily, take this 30 minutes prior to eating in the morning Stay upright 2-3 hours after eating prior to laying down and be mindful of greasy, spicy, tomato/citrus based foods, caffeine, chocolate and alcohol as these can worsen reflux symptoms I will discuss referral to surgeon regarding repair of nissen fundoplication with Dr. Sammi Crick and let you know his recommendation on surgeon  Follow up 4 months  It was a pleasure to see you today. I want to create trusting relationships with patients and provide genuine, compassionate, and quality care. I truly value your feedback! please be on the lookout for a survey regarding your visit with me today. I appreciate your input about our visit and your time in completing this!    Magalie Almon L. Harding Thomure, MSN, APRN, AGNP-C Adult-Gerontology Nurse Practitioner [facility] at [facility]

## 2023-07-21 ENCOUNTER — Telehealth: Payer: Self-pay | Admitting: Cardiovascular Disease

## 2023-07-21 LAB — BASIC METABOLIC PANEL WITH GFR
BUN/Creatinine Ratio: 13 (ref 10–24)
BUN: 18 mg/dL (ref 8–27)
CO2: 25 mmol/L (ref 20–29)
Calcium: 9.5 mg/dL (ref 8.6–10.2)
Chloride: 101 mmol/L (ref 96–106)
Creatinine, Ser: 1.34 mg/dL — ABNORMAL HIGH (ref 0.76–1.27)
Glucose: 80 mg/dL (ref 70–99)
Potassium: 5.4 mmol/L — ABNORMAL HIGH (ref 3.5–5.2)
Sodium: 140 mmol/L (ref 134–144)
eGFR: 61 mL/min/{1.73_m2} (ref >59)

## 2023-07-21 NOTE — Telephone Encounter (Signed)
 Pt returning call for lab results

## 2023-07-21 NOTE — Telephone Encounter (Signed)
 Spoke with patient and informed him his potassium was elevated at 5.4 on recent labs. Per protocol instructed him to hold his potassium supplement and reduce intake of potassium-rich foods until further advisement from Dr. Arlester Ladd.

## 2023-07-26 ENCOUNTER — Encounter: Payer: Self-pay | Admitting: Cardiovascular Disease

## 2023-07-27 ENCOUNTER — Other Ambulatory Visit: Payer: Self-pay

## 2023-07-27 NOTE — Progress Notes (Unsigned)
 Potassium supplement discontinued per Dr. Arlester Ladd due to elevated potassium on recent lab from 07/20/23.

## 2023-07-28 ENCOUNTER — Telehealth: Payer: Self-pay | Admitting: Cardiovascular Disease

## 2023-07-28 NOTE — Telephone Encounter (Signed)
 Patient returned RN's call regarding results.

## 2023-07-29 NOTE — Telephone Encounter (Signed)
 Lab results already discussed with patient, no new orders. Patient aware to stop potassium per Dr. Arlester Ladd.  Patient expressed appreciation for follow-up.

## 2023-08-03 ENCOUNTER — Other Ambulatory Visit: Payer: Self-pay | Admitting: Cardiovascular Disease

## 2023-08-03 DIAGNOSIS — I4819 Other persistent atrial fibrillation: Secondary | ICD-10-CM

## 2023-08-04 NOTE — Telephone Encounter (Signed)
 Pt last saw Dr Arlester Ladd 06/11/23, last labs 07/20/23 Creat 1.34, age 58, weight 110.7kg, CrCl 91.79, based on CrCl pt is on appropriate dosage of Xarelto  20mg  every day.  Will refill rx.

## 2023-08-11 DIAGNOSIS — J019 Acute sinusitis, unspecified: Secondary | ICD-10-CM | POA: Diagnosis not present

## 2023-08-11 DIAGNOSIS — Z6832 Body mass index (BMI) 32.0-32.9, adult: Secondary | ICD-10-CM | POA: Diagnosis not present

## 2023-08-11 DIAGNOSIS — Z20828 Contact with and (suspected) exposure to other viral communicable diseases: Secondary | ICD-10-CM | POA: Diagnosis not present

## 2023-09-09 NOTE — Progress Notes (Unsigned)
 Cardiology Office Note    Date:  09/10/2023  ID:  Austin Woods, DOB Jul 19, 1965, MRN [id number] Cardiologist: Austin Pointer, MD   EP: Dr. Arlester Woods  History of Present Illness:    Austin Woods is a 58 y.o. 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  He was examined by Dr. 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 He reported having constant dizziness but denied any specific anginal symptoms. A 3-day monitor was recommended for further assessment. This showed predominantly normal sinus rhythm with an average heart rate of 70 bpm. He did have 2 episodes of SVT with the longest lasting for 4.3 seconds and a 4.8% PAC burden and less than 1% PVC burden.  Most recent visit with Dr. Arlester Woods was in 06/2023 and he was overall doing well at that time and denied any recent palpitations. QTc was stable by repeat EKG and he was continued on Tikosyn  500 mcg twice daily along with Lopressor  50 mg daily (unclear why taking once daily) and Xarelto  20 mg daily. Follow-up labs in the interim did show hyperkalemia with K+ at 5.4 and potassium supplementation was stopped.  In talking with the patient today, he reports overall feeling well since his last office visit. He denies any recent chest pain or dyspnea on exertion. No specific palpitations, orthopnea, PND or pitting edema. Says he has a difficult time using his CPAP at night due to intermittent GERD. Only takes Lasix  as needed and uses once every few months. He is due for follow-up labs next week. Remains on Xarelto  for anticoagulation with no reports of active bleeding.  Studies Reviewed:    EKG: EKG is not ordered today. EKG from 06/11/2023 is reviewed and shows normal sinus rhythm, heart rate 63 with PAC's and prolonged QT at 472 ms.   NST: 05/2020 The study is normal. This is a low risk study. The left ventricular ejection fraction is hyperdynamic (>65%).   Normal resting and stress perfusion. No ischemia or infarction EF 66%   Echocardiogram: 06/2020 IMPRESSIONS     1. Left ventricular ejection fraction, by estimation, is 60 to 65%. The  left ventricle has normal function. The left ventricle has no regional  wall motion abnormalities. Left ventricular diastolic parameters were  normal. The average left ventricular  global longitudinal strain is -18.0 %. The global longitudinal strain is  normal.   2. Right ventricular systolic function is normal. The right ventricular  size is normal.   3. The mitral valve is normal in structure. No evidence of mitral valve  regurgitation. No evidence of mitral stenosis.   4. The aortic valve is tricuspid. Aortic valve regurgitation is not  visualized. No aortic stenosis is present.   Comparison(s): Echocardiogram done 10/14/18 showed an EF of 60-65%.   Event Monitor: 12/2022   Patch wear time was for 3 days and 14 hours.   Normal sinus rhythm predominantly ranging from 45 to 162 bpm with an average HR 70 bpm.   2 brief runs of SVT, fastest lasting 1.8 seconds and the longest lasting 4.3 seconds.  No ventricular arrhythmias.   No AV block or pauses.   4.8 %  PAC burden and <1% PVC burden.   No patient triggered events.  Risk Assessment/Calculations:    CHA2DS2-VASc Score = 4   This indicates a 4.8% annual risk of stroke. The patient's score is based upon: CHF History: 1 HTN History: 1 Diabetes History: 1 Stroke History: 0 Vascular Disease History: 1 Age Score: 0 Gender Score: 0   Physical Exam:   VS:  BP 122/80 (BP Location: Right Arm, Cuff Size: Normal)   Pulse 90   Ht 6\' 1"  (1.854 m)   Wt 245 lb (111.1 kg)    SpO2 97%   BMI 32.32 kg/m    Wt Readings from Last 3 Encounters:  09/10/23 245 lb (111.1 kg)  07/20/23 244 lb (110.7 kg)  06/11/23 245 lb (111.1 kg)     GEN: Well nourished, well developed male appearing in no acute distress NECK: No JVD; No carotid bruits CARDIAC: RRR, no murmurs, rubs, gallops RESPIRATORY:  Clear to auscultation without rales, wheezing or rhonchi  ABDOMEN: Appears non-distended. No obvious abdominal masses. EXTREMITIES: No clubbing or cyanosis. No pitting edema.  Distal pedal pulses are 2+ bilaterally.   Assessment and Plan:   1. Coronary artery disease involving native coronary artery of native heart without angina pectoris - He underwent PCI to the LAD in 2017 and most recent ischemic evaluation was a low-risk NST in 05/2020 with no evidence of ischemia. - He denies any recent anginal symptoms. Continue Atorvastatin  40 mg daily. He is not on ASA given the need for anticoagulation.  2. Persistent Atrial Fibrillation/Current use of long term anticoagulation - He denies any recent palpitations and is in normal sinus rhythm by examination today. He is on Tikosyn  and followed by Dr. Arlester Woods. Remains on Tikosyn  500 mcg twice daily and has follow-up next week. He is due for follow-up labs with Heme/Onc next week and will add a repeat BMET and Mg at that time given recent hyperkalemia. He is also listed as taking Lopressor  50 mg daily but believes he is taking this as twice daily but unsure of the exact dose. Encouraged him to bring his medications to his office visit next week to clarify. - He is on Xarelto  20 mg daily for anticoagulation which is the appropriate dose given his calculated CrCl of 92 mL/min.  3. Essential hypertension, benign - BP is well-controlled at 122/80 during today's visit. Continue current medical therapy with Amlodipine 5 mg daily and Enalapril  10 mg twice daily. He is unsure if he is taking Lopressor  or Toprol -XL and at what dose. I encouraged him to  bring his medications to his office visit next week so we can clarify this.   4. Mixed hyperlipidemia - LDL was 64 when checked in 05/2022. Will request most recent FLP from PCP. Continue current medical therapy with Atorvastatin  40 mg daily.  Signed, Dorma Gash, PA-C

## 2023-09-10 ENCOUNTER — Encounter: Admitting: Cardiovascular Disease

## 2023-09-10 ENCOUNTER — Encounter (HOSPITAL_COMMUNITY)
Admission: RE | Admit: 2023-09-10 | Discharge: 2023-09-10 | Disposition: A | Discharge status: Home / Self Care | Source: Ambulatory Visit | Attending: Hematology | Admitting: Hematology

## 2023-09-10 ENCOUNTER — Encounter: Payer: Self-pay | Admitting: Student

## 2023-09-10 ENCOUNTER — Ambulatory Visit: Attending: Student | Admitting: Student

## 2023-09-10 VITALS — BP 122/80 | HR 90 | Ht 73.0 in | Wt 245.0 lb

## 2023-09-10 DIAGNOSIS — I2510 Atherosclerotic heart disease of native coronary artery without angina pectoris: Secondary | ICD-10-CM | POA: Diagnosis not present

## 2023-09-10 DIAGNOSIS — I4819 Other persistent atrial fibrillation: Secondary | ICD-10-CM

## 2023-09-10 DIAGNOSIS — Z7901 Long term (current) use of anticoagulants: Secondary | ICD-10-CM

## 2023-09-10 DIAGNOSIS — I10 Essential (primary) hypertension: Secondary | ICD-10-CM | POA: Diagnosis not present

## 2023-09-10 DIAGNOSIS — C7A092 Malignant carcinoid tumor of the stomach: Secondary | ICD-10-CM | POA: Diagnosis not present

## 2023-09-10 DIAGNOSIS — D3A092 Benign carcinoid tumor of the stomach: Secondary | ICD-10-CM | POA: Insufficient documentation

## 2023-09-10 DIAGNOSIS — Z79899 Other long term (current) drug therapy: Secondary | ICD-10-CM

## 2023-09-10 DIAGNOSIS — E782 Mixed hyperlipidemia: Secondary | ICD-10-CM

## 2023-09-10 MED ORDER — COPPER CU 64 DOTATATE 1 MCI/ML IV SOLN: 4.0000 | Freq: Once | INTRAVENOUS | Status: DC

## 2023-09-10 NOTE — Patient Instructions (Addendum)
 Medication Instructions:  Your physician recommends that you continue on your current medications as directed. Please refer to the Current Medication list given to you today.  *If you need a refill on your cardiac medications before your next appointment, please call your pharmacy*  Lab Work: Your physician recommends that you return for lab work in: (BMET, Mg)   If you have labs (blood work) drawn today and your tests are completely normal, you will receive your results only by: MyChart Message (if you have MyChart) OR A paper copy in the mail If you have any lab test that is abnormal or we need to change your treatment, we will call you to review the results.  Testing/Procedures: NONE   Follow-Up: At [facility], you and your health needs are our priority.  As part of our continuing mission to provide you with exceptional heart care, our providers are all part of one team.  This team includes your primary Cardiologist (physician) and Advanced Practice Providers or APPs (Physician Assistants and Nurse Practitioners) who all work together to provide you with the care you need, when you need it.  Your next appointment:   6 month(s)  Provider:   You may see Vishnu P Mallipeddi, MD or one of the following Advanced Practice Providers on your designated Care Team:   [place], PA-C  [place], [place] Theotis Flake, [place]     We recommend signing up for the patient portal called "MyChart".  Sign up information is provided on this After Visit Summary.  MyChart is used to connect with patients for Virtual Visits (Telemedicine).  Patients are able to view lab/test results, encounter notes, upcoming appointments, etc.  Non-urgent messages can be sent to your provider as well.   To learn more about what you can do with MyChart, go to [email or web address].   Other Instructions Thank you for choosing [facility]!

## 2023-09-15 ENCOUNTER — Inpatient Hospital Stay: Payer: BC Managed Care – PPO | Attending: Hematology

## 2023-09-15 DIAGNOSIS — R978 Other abnormal tumor markers: Secondary | ICD-10-CM | POA: Diagnosis not present

## 2023-09-15 DIAGNOSIS — Z79899 Other long term (current) drug therapy: Secondary | ICD-10-CM | POA: Insufficient documentation

## 2023-09-15 DIAGNOSIS — D3A092 Benign carcinoid tumor of the stomach: Secondary | ICD-10-CM | POA: Diagnosis not present

## 2023-09-15 LAB — HEPATIC FUNCTION PANEL
ALT: 17 U/L (ref 0–44)
AST: 21 U/L (ref 15–41)
Albumin: 3.7 g/dL (ref 3.5–5.0)
Alkaline Phosphatase: 41 U/L (ref 38–126)
Bilirubin, Direct: 0.2 mg/dL (ref 0.0–0.2)
Indirect Bilirubin: 1 mg/dL — ABNORMAL HIGH (ref 0.3–0.9)
Total Bilirubin: 1.2 mg/dL (ref 0.0–1.2)
Total Protein: 6.6 g/dL (ref 6.5–8.1)

## 2023-09-16 ENCOUNTER — Ambulatory Visit: Attending: Cardiovascular Disease | Admitting: Cardiovascular Disease

## 2023-09-16 ENCOUNTER — Encounter: Payer: Self-pay | Admitting: Cardiovascular Disease

## 2023-09-16 VITALS — BP 134/68 | HR 86 | Ht 73.0 in | Wt 241.0 lb

## 2023-09-16 DIAGNOSIS — I4819 Other persistent atrial fibrillation: Secondary | ICD-10-CM

## 2023-09-16 MED ORDER — DOFETILIDE 250 MCG PO CAPS: 250.0000 ug | ORAL_CAPSULE | Freq: Two times a day (BID) | ORAL | 6 refills | Status: DC

## 2023-09-16 NOTE — Patient Instructions (Signed)
 Medication Instructions:  DECREASE Tikosyn  to 250 mcg twice daily  *If you need a refill on your cardiac medications before your next appointment, please call your pharmacy*  Follow-Up: At [facility], you and your health needs are our priority.  As part of our continuing mission to provide you with exceptional heart care, our providers are all part of one team.  This team includes your primary Cardiologist (physician) and Advanced Practice Providers or APPs (Physician Assistants and Nurse Practitioners) who all work together to provide you with the care you need, when you need it.  Your next appointment:   1 month(s)  Provider:   You will follow up in the Atrial Fibrillation Clinic located at [facility]. Your provider will be: Clint R. Fenton, PA-C or Minnie Amber, PA-C

## 2023-09-16 NOTE — Progress Notes (Signed)
  Electrophysiology Office Note:    Date:  09/16/2023   ID:  Cherylynn Cosier, DOB 1966/03/03, MRN [id number]  PCP:  Pauleen Borne   [facility] Providers Cardiologist:  Lasalle Pointer, MD Electrophysiologist:  Efraim Grange, MD     Referring MD: Sarrah Cure, PA-C   History of Present Illness:    Austin Woods is a 58 y.o. male with a medical history significant for persistent atrial fibrillation, obesity, obstructive sleep apnea, who presents for electrophysiology follow-up.     Patient has a history of atrial fibrillation and has undergone ablation on 2 occasions.  He has since been started on Tikosyn  and has been maintaining sinus rhythm.  Recalls that he was taken off Tikosyn  for brief period in the past but immediately had recurrence of atrial fibrillation.  He is symptomatic with rapid rates and palpitations when he does have atrial fibrillation.      Today, he reports that he is doing very well from a rhythm standpoint.  He has not had any recurrence of rapid rates or palpitations while on Tikosyn . No changes from his perspective since our last visit.  EKGs/Labs/Other Studies Reviewed Today:    Echocardiogram:  TTE June 05, 2020 LVEF 60 to 65%.  Left atrium normal in size   Monitors:  01/14/2023   Patch wear time was for 3 days and 14 hours.   Normal sinus rhythm predominantly ranging from 45 to 162 bpm with an average HR 70 bpm.   2 brief runs of SVT, fastest lasting 1.8 seconds and the longest lasting 4.3 seconds.  No ventricular arrhythmias.   No AV block or pauses.   4.8 % PAC burden and <1% PVC burden.   No patient triggered events.  Stress testing:  Reviewed in Epic  Advanced imaging:   Cardiac catherization   EKG:         Physical Exam:    VS:  BP 134/68   Pulse 86   Ht 6' 1 (1.854 m)   Wt 241 lb (109.3 kg)   SpO2 96%   BMI 31.80 kg/m     Wt Readings from Last 3 Encounters:  09/16/23 241 lb (109.3 kg)   09/10/23 245 lb (111.1 kg)  07/20/23 244 lb (110.7 kg)     GEN: Well nourished, well developed in no acute distress CARDIAC: RRR, no murmurs, rubs, gallops RESPIRATORY:  Normal work of breathing MUSCULOSKELETAL: no edema    ASSESSMENT & PLAN:    Paroxysmal atrial fibrillation Status post AF ablation x2 Currently on Tikosyn , maintaining sinus rhythm   High risk medication-Tikosyn  On 500 mcg twice daily EKG reviewed today; QTc is within acceptable limits BMP 07/20/23 reviewed today -- Cr elevated -- repeat check is pending. ECG looks prolonged to me today though the machine is reading Qtc of 478 I am decreasing the Tikosyn  dose to 250 mcg Pt will follow up in AF clinic in about a month for monitoring  Secondary hypercoagulable state Continue Xarelto  20 mg with evening meal      Signed, Efraim Grange, MD  09/16/2023 4:28 PM    [facility]

## 2023-09-17 ENCOUNTER — Telehealth: Payer: Self-pay

## 2023-09-17 NOTE — Telephone Encounter (Signed)
 Attempted to contact patient, left message to call office. Patient had an appt with Dr Arlester Ladd and left without making recommended 1 month follow up appt at the AF clinic.

## 2023-09-17 NOTE — Telephone Encounter (Signed)
 Spoke with patient, follow up appt at AF clinic scheduled for 7/16 at 3:30 pm. No further needs at this time

## 2023-09-22 ENCOUNTER — Inpatient Hospital Stay: Payer: BC Managed Care – PPO | Admitting: Hematology

## 2023-09-28 ENCOUNTER — Inpatient Hospital Stay (HOSPITAL_BASED_OUTPATIENT_CLINIC_OR_DEPARTMENT_OTHER): Admitting: Hematology

## 2023-09-28 VITALS — BP 118/88 | HR 74 | Temp 97.5°F | Resp 18 | Wt 242.9 lb

## 2023-09-28 DIAGNOSIS — Z79899 Other long term (current) drug therapy: Secondary | ICD-10-CM | POA: Diagnosis not present

## 2023-09-28 DIAGNOSIS — R978 Other abnormal tumor markers: Secondary | ICD-10-CM | POA: Diagnosis not present

## 2023-09-28 DIAGNOSIS — D3A092 Benign carcinoid tumor of the stomach: Secondary | ICD-10-CM | POA: Diagnosis not present

## 2023-09-28 NOTE — Progress Notes (Signed)
 [facility] [address] [place] [address]    Clinic Day:  09/28/2023  Referring physician: Vida Woods DEL, PA-C  Patient Care Team: Austin Woods DEL Austin Woods as PCP - General (Physician Assistant) Austin Diannah SQUIBB, MD as PCP - Cardiology (Cardiology) Mealor, Austin BRAVO, MD as PCP - Electrophysiology (Cardiology) Austin Hai, MD as Medical Oncologist (Medical Oncology)   ASSESSMENT & PLAN:   Assessment: 1.  Gastric carcinoid tumor: - Has history of GERD for 20+ years.  Underwent Nissen's fundoplication 20 years ago at [facility]. - No B symptoms.  No flushing.  Has wheezing from asthma and uses Advair inhaler daily.  Reports soft stools for the past 1 year. - EGD and gastric nodule biopsy (04/16/2022): Well-differentiated neuroendocrine tumor.  Transitional type mucosa with mild PPI effect in the stomach.  Ki-67 3-10%.  Overall grade 1 well-differentiated neuroendocrine tumor. - EGD/EUS (07/16/2022): 3 5-7 mm submucosal nodules with no bleeding and no stigmata of recent bleeding were found in the anterior wall of the gastric body (between 50-52 cm from incisors).  Status post endoscopic mucosal resection.  No malignant lymph nodes were visualized in the paracardial, left gastric and gastrohepatic ligament and celiac region by EUS. 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  Stomach nodule #3 biopsy shows fundic gland polyp with linear and focal nodular neuroendocrine cell hyperplasia.  No malignancy identified.  Stomach biopsy also showed gastric antral and oxyntic mucosa with features of reactive gastropathy, negative for H. pylori.  Ki-67 was less than 3%.  Tumor cells positive for synaptophysin and CD56. - Gastrin level: 880 (08/28/2022), 423 (06/09/2022) - Chromogranin A (06/09/2022): 1228, 1602 on 08/28/2022. - Dotatate  PET scan (10/12/2022): Increased uptake within the wall of the proximal stomach just below the GE junction with SUV 8.9.  No abdominal pelvic lymphadenopathy.  Increased uptake in the prostate gland with SUV 8.7.  New indeterminate nodule within the right lung base without increased uptake measuring 1 cm.   2.  Social/family history: - Lives at home with his wife.  Works as a Engineer, petroleum at [employer].  Quit smoking 12 years ago.  Smoked half pack per day for 3 years. - No family history of malignancy.    Plan: 1.  Gastric carcinoid: - Last underwent EGD/EUS and biopsy on 02/22/2023.  Pathology did not show neuroendocrine tumor.  It showed hyperplastic polyp. - He denies any abdominal pain, flushing/wheezing/diarrhea. - He was started on dexlansoprazole  on 07/20/2023. - I reviewed labs from 09/15/2023: Gastrin level increased to 1800 from 600 previously.  Chromogranin level is stable at 1440.  I think elevated gastrin level is from dexlansoprazole . - Reviewed images of dotatate PET scan from 09/10/2023: A focus of radio activity in the distal gastric body, indeterminate. - Recommend follow-up in 6 months with repeat tumor markers and PET scan.  If there is any worsening, will consider referral to Dr. Wilhelmenia for EGD/EUS.  If there is no worsening, may switch to dotatate PET scan once a year with intermittent tumor markers.    Orders Placed This Encounter  Procedures   NM PET DOTATATE SKULL BASE TO MID THIGH    Standing Status:   Future    Expected Date:   03/29/2024    Expiration Date:   06/27/2024    If indicated for the ordered procedure, I authorize the administration of a radiopharmaceutical  per Radiology protocol:   Yes    Preferred imaging location?:   [facility]    Release to patient:   Immediate   Chromogranin A    Standing Status:   Future    Expected Date:   03/29/2024    Expiration Date:   06/27/2024   Gastrin    Standing Status:   Future    Expected Date:   03/29/2024     Expiration Date:   06/27/2024   Hepatic function panel    Standing Status:   Future    Expected Date:   03/29/2024    Expiration Date:   06/27/2024      Austin Woods,acting as a scribe for Austin Stands, MD.,have documented all relevant documentation on the behalf of Austin Stands, MD,as directed by  Austin Stands, MD while in the presence of Austin Stands, MD.  I, Austin Stands MD, have reviewed the above documentation for accuracy and completeness, and I agree with the above.    Austin Stands, MD   6/24/20254:40 PM  CHIEF COMPLAINT:   Diagnosis: gastric carcinoid tumor   Cancer Staging  No matching staging information was found for the patient.    Prior Therapy: endoscopic mucosal resection, 07/16/22  Current Therapy:  surveillance   HISTORY OF PRESENT ILLNESS:   Oncology History   No history exists.     INTERVAL HISTORY:   Austin Woods is a 58 y.o. male presenting to clinic today for follow up of gastric carcinoid tumor. He was last seen by me on 03/24/23.  Since his last visit, he underwent PET dotatate on 09/10/23 that found: Partial gastrectomy for carcinoid tumor of the proximal stomach. A focus of radiotracer activity in the distal gastric body is indeterminate. No evidence of metastatic adenopathy in the upper abdomen. No evidence of liver metastasis. No distant metastatic disease.  Today, he states that he is doing well overall. His appetite level is at 100%. His energy level is at 50%. Austin Woods states he was recently started on dexilant  in April 2025 by his GI NP. He does not have follow-up with Dr. Wilhelmenia. He reports he was also started on a separate medication for gas prescribed by Dr. Wilhelmenia, though he cannot remember the name of the medication.   He denies any flushing or wheezing. Austin Woods notes occasional diarrhea he attributes to his diet, not any medication.    PAST MEDICAL HISTORY:   Past Medical History: Past  Medical History:  Diagnosis Date   A-fib (HCC)    Allergy    All my life   Anemia    iron  def anemia   Arthritis    Asthma    Coronary artery disease    Dysrhythmia    in and out Afib   GERD (gastroesophageal reflux disease)    Headache    High cholesterol    History of DVT (deep vein thrombosis)    a. left leg following ablation for SVT   Hypertension    Persistent atrial fibrillation (HCC)    a. Dx 08/2012, xarelto  initiated; b. 09/2012 Tikosyn  initiated -> DCCV -> Sinus c. PVI 03/2013 d. 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   Type 2 diabetes mellitus without complication, without long-term current use of insulin (HCC) 02/16/2018   Wolff-Parkinson-White (WPW) syndrome    resolved s/p RFCA 2001 by Dr Fernande    Surgical History: Past Surgical History:  Procedure Laterality Date  ATRIAL FIBRILLATION ABLATION  03/23/2013   PVI by DR KELSIE for afib   ATRIAL FIBRILLATION ABLATION N/A 03/23/2013   Procedure: ATRIAL FIBRILLATION ABLATION;  Surgeon: Lynwood JONETTA KELSIE, MD;  Location: [facility] CATH LAB;  Service: Cardiovascular;  Laterality: N/A;   ATRIAL FIBRILLATION ABLATION N/A 03/27/2014   PVI Dr KELSIE SANES SURGERY     BIOPSY  02/28/2020   Procedure: BIOPSY;  Surgeon: [name], MD;  Location: [facility];  Service: Endoscopy;;  antral(pre-pyloric)    BIOPSY  04/16/2022   Procedure: BIOPSY;  Surgeon: [name], MD;  Location: [facility];  Service: Gastroenterology;;   BIOPSY  07/16/2022   Procedure: BIOPSY;  Surgeon: [name], MD;  Location: [facility];  Service: Gastroenterology;;   BIOPSY  02/22/2023   Procedure: BIOPSY;  Surgeon: [name], MD;  Location: [facility];  Service: Gastroenterology;;   CARDIAC CATHETERIZATION N/A 02/03/2016   Procedure: Left Heart Cath and Coronary Angiography;  Surgeon: [name], MD;  Location: [facility] INVASIVE CV LAB;  Service: Cardiovascular;   Laterality: N/A;   CARDIAC CATHETERIZATION N/A 02/03/2016   Procedure: Intravascular Pressure Wire/FFR Study;  Surgeon: [name], MD;  Location: [facility] INVASIVE CV LAB;  Service: Cardiovascular;  Laterality: N/A;   CARDIAC CATHETERIZATION N/A 02/03/2016   Procedure: Coronary Stent Intervention;  Surgeon: [name], MD;  Location: [facility] INVASIVE CV LAB;  Service: Cardiovascular;  Laterality: N/A;   CARDIAC SURGERY     CARDIOVERSION N/A 08/25/2012   Procedure: CARDIOVERSION;  Surgeon: [name], MD;  Location: [facility] ENDOSCOPY;  Service: Cardiovascular;  Laterality: N/A;   CARDIOVERSION N/A 09/06/2012   Procedure: CARDIOVERSION/Bedside;  Surgeon: [name], MD;  Location: [facility] OR;  Service: Cardiovascular;  Laterality: N/A;   CARDIOVERSION N/A 01/12/2014   Procedure: CARDIOVERSION;  Surgeon: [name], MD;  Location: [facility] ENDOSCOPY;  Service: Cardiovascular;  Laterality: N/A;   CARDIOVERSION N/A 01/30/2016   Procedure: CARDIOVERSION;  Surgeon: [name], MD;  Location: [facility] ENDOSCOPY;  Service: Cardiovascular;  Laterality: N/A;   CARDIOVERSION N/A 04/14/2016   Procedure: CARDIOVERSION;  Surgeon: [name], MD;  Location: [facility] ENDOSCOPY;  Service: Cardiovascular;  Laterality: N/A;   COLONOSCOPY N/A 12/04/2015   Procedure: COLONOSCOPY;  Surgeon: Claudis PENNER Golda, MD;  [facility]: [facility];  Service: Endoscopy;  Laterality: N/A;  730   COLONOSCOPY WITH PROPOFOL  N/A 04/28/2019   Procedure: COLONOSCOPY WITH PROPOFOL ;  Surgeon: [name], MD;  [facility]: [facility];  Service: Endoscopy;  Laterality: N/A;   CORONARY ANGIOPLASTY     CORONARY ANGIOPLASTY WITH STENT PLACEMENT     ELECTROPHYSIOLOGIC STUDY N/A 02/18/2016   Procedure: Atrial Fibrillation Ablation;  Surgeon: Lynwood KELSIE, MD;  Location: [facility] INVASIVE CV LAB;  Service: Cardiovascular;  Laterality: N/A;   ENDOSCOPIC MUCOSAL RESECTION N/A 07/16/2022   Procedure: ENDOSCOPIC MUCOSAL RESECTION;  Surgeon:  [name], MD;  Location: [facility] ENDOSCOPY;  Service: Gastroenterology;  Laterality: N/A;   ENDOSCOPIC MUCOSAL RESECTION  02/22/2023   Procedure: ENDOSCOPIC MUCOSAL RESECTION;  Surgeon: [name], MD;  Location: [facility] ENDOSCOPY;  Service: Gastroenterology;;   ESOPHAGOGASTRODUODENOSCOPY N/A 02/22/2023   Procedure: ESOPHAGOGASTRODUODENOSCOPY (EGD);  Surgeon: [name], MD;  Location: [facility];  Service: Gastroenterology;  Laterality: N/A;   ESOPHAGOGASTRODUODENOSCOPY (EGD) WITH PROPOFOL  N/A 04/28/2019   Procedure: ESOPHAGOGASTRODUODENOSCOPY (EGD) WITH PROPOFOL ;  Surgeon: [name], MD;  [facility]: [facility];  Service: Endoscopy;  Laterality: N/A;  7:30   ESOPHAGOGASTRODUODENOSCOPY (EGD) WITH  PROPOFOL  N/A 02/28/2020   Procedure: ESOPHAGOGASTRODUODENOSCOPY (EGD) WITH PROPOFOL ;  Surgeon: [name], MD;  [facility]: [facility];  Service: Endoscopy;  Laterality: N/A;  1245   ESOPHAGOGASTRODUODENOSCOPY (EGD) WITH PROPOFOL  N/A 04/16/2022   Procedure: ESOPHAGOGASTRODUODENOSCOPY (EGD) WITH PROPOFOL ;  Surgeon: [name], MD;  [facility]: [facility];  Service: Gastroenterology;  Laterality: N/A;  730am, asa 1-2   ESOPHAGOGASTRODUODENOSCOPY (EGD) WITH PROPOFOL  N/A 07/16/2022   Procedure: ESOPHAGOGASTRODUODENOSCOPY (EGD) WITH PROPOFOL ;  Surgeon: [name], MD;  Location: [facility];  Service: Gastroenterology;  Laterality: N/A;   EUS N/A 07/16/2022   Procedure: UPPER ENDOSCOPIC ULTRASOUND (EUS) RADIAL;  Surgeon: [name], MD;  Location: [facility] ENDOS[facility];  Service: Gastroenterology;  Laterality: N/A;   EUS N/A 02/22/2023   Procedure: UPPER ENDOSCOPIC ULTRASOUND (EUS) RADIAL;  Surgeon: [name], MD;  Location: [facility];  Service: Gastroenterology;  Laterality: N/A;   HEMOSTASIS CLIP PLACEMENT  07/16/2022   Procedure: HEMOSTASIS CLIP PLACEMENT;  Surgeon: [name], MD;  Location: [facility];  Service: Gastroenterology;;   HEMOSTASIS CLIP PLACEMENT  02/22/2023   Procedure: HEMOSTASIS CLIP PLACEMENT;  Surgeon: [name], MD;  Location: [facility];  Service: Gastroenterology;;   HERNIA REPAIR     10 years   implantable loop recorder removal  03/01/2019   MDT Reveal LINQ removed in office   INGUINAL HERNIA REPAIR Bilateral 06/04/2017   Procedure: OPEN BILATERAL INGUINAL HERNIA REPAIR;  Surgeon: Kimble Agent, MD;  Location: [facility] OR;  Service: General;  Laterality: Bilateral;   LOOP RECORDER IMPLANT N/A 06/29/2014   Procedure: LOOP RECORDER IMPLANT;  Surgeon: Agent Rakers, MD;  Location: [facility] CATH LAB;  Service: Cardiovascular;  Laterality: N/A;   NOSE SURGERY     sleep apnea surgery   PERIPHERAL VASCULAR CATHETERIZATION N/A 03/13/2016   Procedure: Thrombin  Injection;  Surgeon: Gaile LELON New, MD;  Location: [facility] INVASIVE CV LAB;  Service: Cardiovascular;  Laterality: N/A;   POLYPECTOMY  04/28/2019   Procedure: POLYPECTOMY;  Surgeon: [name], MD;  Location: [facility];  Service: Endoscopy;;  duodenum;splenic flexure;   SPINE SURGERY     10 years agao   STOMACH SURGERY     reflux, fundoplication   SUBMUCOSAL LIFTING INJECTION  07/16/2022   Procedure: SUBMUCOSAL LIFTING INJECTION;  Surgeon: [name], MD;  Location: [facility];  Service: Gastroenterology;;   SUBMUCOSAL LIFTING INJECTION  02/22/2023   Procedure: SUBMUCOSAL LIFTING INJECTION;  Surgeon: [name], MD;  Location: [facility];  Service: Gastroenterology;;   TEE WITHOUT CARDIOVERSION N/A 08/25/2012   Procedure: TRANSESOPHAGEAL ECHOCARDIOGRAM (TEE);  Surgeon: [name], MD;  Location[facility] ENDOSCOPY;  Service: Cardiovascular;  Laterality: N/A;   TEE WITHOUT CARDIOVERSION  03/23/2013   DR ROSS   TEE WITHOUT CARDIOVERSION N/A 03/23/2013   Procedure: TRANSESOPHAGEAL ECHOCARDIOGRAM (TEE);  Surgeon: Vina LULLA Gull, MD;  Location: [facility] ENDOSCOPY;  Service:  Cardiovascular;  Laterality: N/A;   TEE WITHOUT CARDIOVERSION N/A 03/26/2014   Procedure: TRANSESOPHAGEAL ECHOCARDIOGRAM (TEE);  Surgeon: Maude JAYSON Emmer, MD;  Location: [facility] ENDOSCOPY;  Service: Cardiovascular;  Laterality: N/A;   TONSILLECTOMY      Social History: Social History   Socioeconomic History   Marital status: Married    Spouse name: Not on file   Number of children: 2   Years of education: Not on file   Highest education level: Not on file  Occupational History    Employer: [employer]  Tobacco Use   Smoking status: Former  Current packs/day: 0.00    Average packs/day: 1 pack/day for 7.2 years (7.2 ttl pk-yrs)    Types: Cigarettes    Start date: 05/19/2003    Quit date: 08/11/2012    Years since quitting: 11.1   Smokeless tobacco: Never  Vaping Use   Vaping status: Never Used  Substance and Sexual Activity   Alcohol use: No   Drug use: No   Sexual activity: Yes    Birth control/protection: None  Other Topics Concern   Not on file  Social History Narrative   Pt lives in [place] with wife.  Works at [employer]: Not on [employer] Insecurity: Not on file  Transportation Needs: Not on file  Physical Activity: Not on file  Stress: Not on file  Social Connections: Not on file  Intimate Partner Violence: Not on file    Family History: Family History  Problem Relation Age of Onset   Diabetes Mother    Anemia Mother    Gout Mother    Kidney disease Mother    Obesity Mother    CAD Father 14   Diabetes Father    Heart attack Father    Transient ischemic attack Father    Vascular Disease Father    Heart disease Father    Hypertension Father    Obesity Father    Stroke Father    Diabetes Brother    Aneurysm Maternal Grandmother    Stroke Maternal Grandfather    Diabetes Paternal Grandmother    Stroke Paternal Grandfather     Current Medications:  Current Outpatient Medications:    albuterol  (VENTOLIN   HFA) 108 (90 Base) MCG/ACT inhaler, Inhale 2 puffs into the lungs every 6 (six) hours as needed for wheezing or shortness of breath., Disp: 8 g, Rfl: 0   amLODipine (NORVASC) 5 MG tablet, Take 5 mg by mouth daily., Disp: , Rfl:    atorvastatin  (LIPITOR) 40 MG tablet, Take 1 tablet (40 mg total) by mouth daily., Disp: 90 tablet, Rfl: 3   azelastine  (ASTELIN ) 0.1 % nasal spray, Place 1 spray into both nostrils 2 (two) times daily as needed for rhinitis or allergies., Disp: , Rfl:    benzonatate  (TESSALON ) 100 MG capsule, Take 100 mg by mouth., Disp: , Rfl:    blood glucose meter kit and supplies, Dispense based on patient and insurance preference. Use to check blood sugar once a day(FOR ICD-10 E10.9, E11.9)., Disp: 1 each, Rfl: 0   Blood Glucose Monitoring Suppl (CONTOUR NEXT MONITOR) w/Device KIT, as directed per package instructions twice a day, Disp: , Rfl:    budesonide-formoterol  (SYMBICORT) 160-4.5 MCG/ACT inhaler, 1 puff as needed Inhalation every 4 hrs, Disp: , Rfl:    cetirizine (ZYRTEC) 10 MG tablet, Take 10 mg by mouth daily., Disp: , Rfl:    Cholecalciferol  (VITAMIN D ) 2000 UNITS CAPS, Take 4,000 Units by mouth daily. , Disp: , Rfl:    dexlansoprazole  (DEXILANT ) 60 MG capsule, Take 1 capsule (60 mg total) by mouth daily., Disp: 90 capsule, Rfl: 1   dofetilide  (TIKOSYN ) 250 MCG capsule, Take 1 capsule (250 mcg total) by mouth 2 (two) times daily., Disp: 60 capsule, Rfl: 6   enalapril  (VASOTEC ) 10 MG tablet, Take 1 tablet (10 mg total) by mouth 2 (two) times daily., Disp: 180 tablet, Rfl: 3   fluticasone  (FLONASE) 50 MCG/ACT nasal spray, Place 1 spray into both nostrils daily as needed for allergies. , Disp: ,  Rfl:    Fluticasone -Salmeterol (ADVAIR DISKUS) 250-50 MCG/DOSE AEPB, Inhale 1 puff into the lungs every 12 (twelve) hours., Disp: 60 each, Rfl: 0   furosemide  (LASIX ) 40 MG tablet, Take 1 tablet (40 mg total) by mouth as needed., Disp: , Rfl:    glipiZIDE (GLUCOTROL XL) 5 MG 24 hr  tablet, Take 5 mg by mouth daily., Disp: , Rfl:    glucose blood (CONTOUR NEXT TEST) test strip, as directed per package instructions In Vitro twice a day, Disp: , Rfl:    hydrOXYzine  (VISTARIL ) 25 MG capsule, Take 2 capsules (50 mg total) by mouth 3 (three) times daily as needed for anxiety., Disp: 30 capsule, Rfl: 0   loratadine (CLARITIN) 10 MG tablet, Take 10 mg by mouth daily as needed for allergies. , Disp: , Rfl:    Magnesium  Oxide 250 MG TABS, 1 tablet Orally Four times a day, Disp: , Rfl:    meloxicam  (MOBIC ) 15 MG tablet, Take 15 mg by mouth daily as needed for pain., Disp: , Rfl:    metFORMIN  (GLUCOPHAGE ) 1000 MG tablet, TAKE 1 TABLET BY MOUTH TWICE  DAILY WITH MEALS, Disp: 30 tablet, Rfl: 0   metoprolol  tartrate (LOPRESSOR ) 50 MG tablet, Take 50 mg by mouth daily., Disp: , Rfl:    nitroGLYCERIN  (NITROSTAT ) 0.4 MG SL tablet, DISSOLVE 1 TABLET UNDER THE TONGUE EVERY 5 MINUTES AS  NEEDED FOR CHEST PAIN. MAX  OF 3 TABLETS IN 15 MINUTES. CALL 911 IF PAIN PERSISTS., Disp: 25 tablet, Rfl: 1   ondansetron  (ZOFRAN ) 4 MG tablet, TAKE 1 TABLET BY MOUTH EVERY 8 HOURS AS NEEDED FOR NAUSEA AND VOMITING, Disp: 30 tablet, Rfl: 0   oxybutynin  (DITROPAN ) 5 MG tablet, TAKE ONE-HALF TABLET (2.5mg ) BY  MOUTH TWO TIMES DAILY AS  NEEDED FOR BLADDER SPASMS, Disp: 30 tablet, Rfl: 5   rivaroxaban  (XARELTO ) 20 MG TABS tablet, TAKE 1 TABLET BY MOUTH DAILY WITH SUPPER, Disp: 30 tablet, Rfl: 6   Simethicone  80 MG TABS, Take 1 tablet (80 mg total) by mouth every 6 (six) hours as needed., Disp: 60 tablet, Rfl: 1   vitamin B-12 (CYANOCOBALAMIN ) 500 MCG tablet, Take 500 mcg by mouth daily., Disp: , Rfl:    Allergies: No Known Allergies  REVIEW OF SYSTEMS:   Review of Systems  Constitutional:  Negative for chills, fatigue and fever.  HENT:   Negative for lump/mass, mouth sores, nosebleeds, sore throat and trouble swallowing.   Eyes:  Negative for eye problems.  Respiratory:  Positive for cough and shortness of  breath.   Cardiovascular:  Positive for chest pain (occasional). Negative for leg swelling and palpitations.  Gastrointestinal:  Positive for constipation, diarrhea and nausea. Negative for abdominal pain and vomiting.  Genitourinary:  Negative for bladder incontinence, difficulty urinating, dysuria, frequency, hematuria and nocturia.   Musculoskeletal:  Negative for arthralgias, back pain, flank pain, myalgias and neck pain.  Skin:  Negative for itching and rash.  Neurological:  Positive for dizziness, headaches and numbness (and tingling in hands and feet).  Hematological:  Does not bruise/bleed easily.  Psychiatric/Behavioral:  Negative for depression, sleep disturbance and suicidal ideas. The patient is not nervous/anxious.   All other systems reviewed and are negative.    VITALS:   Blood pressure 118/88, pulse 74, temperature (!) 97.5 F (36.4 C), temperature source Oral, resp. rate 18, weight 242 lb 15.2 oz (110.2 kg), SpO2 100%.  Wt Readings from Last 3 Encounters:  09/28/23 242 lb 15.2 oz (110.2 kg)  09/16/23 241  lb (109.3 kg)  09/10/23 245 lb (111.1 kg)    Body mass index is 32.05 kg/m.  Performance status (ECOG): 1 - Symptomatic but completely ambulatory  PHYSICAL EXAM:   Physical Exam Vitals and nursing note reviewed. Exam conducted with a chaperone present.  Constitutional:      Appearance: Normal appearance.   Cardiovascular:     Rate and Rhythm: Normal rate and regular rhythm.     Pulses: Normal pulses.     Heart sounds: Normal heart sounds.  Pulmonary:     Effort: Pulmonary effort is normal.     Breath sounds: Normal breath sounds.  Abdominal:     Palpations: Abdomen is soft. There is no hepatomegaly, splenomegaly or mass.     Tenderness: There is no abdominal tenderness.   Musculoskeletal:     Right lower leg: No edema.     Left lower leg: No edema.  Lymphadenopathy:     Cervical: No cervical adenopathy.     Right cervical: No superficial, deep or  posterior cervical adenopathy.    Left cervical: No superficial, deep or posterior cervical adenopathy.     Upper Body:     Right upper body: No supraclavicular or axillary adenopathy.     Left upper body: No supraclavicular or axillary adenopathy.   Neurological:     General: No focal deficit present.     Mental Status: He is alert and oriented to person, place, and time.   Psychiatric:        Mood and Affect: Mood normal.        Behavior: Behavior normal.     LABS:      Latest Ref Rng & Units 01/29/2020    9:30 AM 08/07/2019   12:50 PM 04/26/2019   10:15 AM  CBC  WBC 3.4 - 10.8 x10E3/uL 7.6  8.6  8.7   Hemoglobin 13.0 - 17.7 g/dL 86.4  85.5  88.6   Hematocrit 37.5 - 51.0 % 42.1  46.8  36.8   Platelets 150 - 450 x10E3/uL 357  285  275       Latest Ref Rng & Units 09/15/2023    2:20 PM 07/20/2023   11:35 AM 12/14/2022    9:07 AM  CMP  Glucose 70 - 99 mg/dL  80  857   BUN 8 - 27 mg/dL  18  18   Creatinine 9.23 - 1.27 mg/dL  8.65  8.93   Sodium 865 - 144 mmol/L  140  138   Potassium 3.5 - 5.2 mmol/L  5.4  4.4   Chloride 96 - 106 mmol/L  101  102   CO2 20 - 29 mmol/L  25  26   Calcium  8.6 - 10.2 mg/dL  9.5  9.1   Total Protein 6.5 - 8.1 g/dL 6.6     Total Bilirubin 0.0 - 1.2 mg/dL 1.2     Alkaline Phos 38 - 126 U/L 41     AST 15 - 41 U/L 21     ALT 0 - 44 U/L 17        No results found for: CEA1, CEA / No results found for: CEA1, CEA Lab Results  Component Value Date   PSA1 0.3 11/25/2018   No results found for: CAN199 No results found for: CAN125  No results found for: TOTALPROTELP, ALBUMINELP, A1GS, A2GS, BETS, BETA2SER, GAMS, MSPIKE, SPEI Lab Results  Component Value Date   TIBC 462 (H) 01/29/2020   TIBC 509 (H) 03/24/2019   TIBC  545 (H) 02/13/2019   FERRITIN 12 (L) 01/29/2020   FERRITIN 4 (L) 03/24/2019   FERRITIN 4 (L) 02/13/2019   IRONPCTSAT 25 01/29/2020   IRONPCTSAT 5 (L) 03/24/2019   IRONPCTSAT 4 (L) 02/13/2019   No  results found for: LDH   STUDIES:   NM PET DOTATATE SKULL BASE TO MID THIGH Result Date: 09/10/2023 CLINICAL DATA:  Gastric carcinoid tumor.  Surveillance exam EXAM: NUCLEAR MEDICINE PET SKULL BASE TO THIGH TECHNIQUE: 4.9 mCi copper  64 DOTATATE was injected intravenously. Full-ring PET imaging was performed from the skull base to thigh after the radiotracer. CT data was obtained and used for attenuation correction and anatomic localization. COMPARISON:  DOTATATE PET scan 10/12/2022 FINDINGS: NECK No radiotracer activity in neck lymph nodes. Incidental CT findings: None CHEST No radiotracer accumulation within mediastinal or hilar lymph nodes. No suspicious pulmonary nodules on the CT scan. Incidental CT finding:None ABDOMEN/PELVIS Postsurgical change consistent with partial gastrectomy. The previous seen intense radiotracer activity in the gastric fundus has been resected. A focus of residual activity is present in the distal gastric body (image 103) with SUV max equal 13. No radiotracer avid gastrohepatic ligament lymph nodes. No abnormal radiotracer activity in the liver. No mesenteric implants. No additional abnormal which is activity in the bowel or pancreas. Physiologic activity noted in the liver, spleen, adrenal glands and kidneys. Incidental CT findings:None SKELETON No focal activity to suggest skeletal metastasis. Incidental CT findings:None IMPRESSION: 1. 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 A focus of radiotracer activity in the distal gastric body is indeterminate 2. No evidence of metastatic adenopathy in the upper abdomen. 3. No evidence of liver metastasis. 4. No distant metastatic disease. Electronically Signed   By: Jackquline Boxer M.D.   On: 09/10/2023 12:40

## 2023-09-28 NOTE — Patient Instructions (Addendum)
 [facility]  Discharge Instructions  You were seen and examined today by Dr. Rogers.  Dr. Rogers discussed your most recent lab work and PET scan which revealed everything looks good and stable except your gastrin was elevated and the PET scan was not very impressive. Dr. Katragadda thinks that the Dexilant  may be causing the Gastrin marker to be elevated.   Dr. Katragadda will continue to watch your levels. Because of the one questionable area on the scan we will repeat the scan in 6 months this time. If everything looks good then will start back with once a year scan.  Follow-up as scheduled.    Thank you for choosing  [facility] to provide your oncology and hematology care.   To afford each patient quality time with our provider, please arrive at least 15 minutes before your scheduled appointment time. You may need to reschedule your appointment if you arrive late (10 or more minutes). Arriving late affects you and other patients whose appointments are after yours.  Also, if you miss three or more appointments without notifying the office, you may be dismissed from the clinic at the provider's discretion.    Again, thank you for choosing [facility].  Our hope is that these requests will decrease the amount of time that you wait before being seen by our physicians.   If you have a lab appointment with the Cancer Center - please note that after April 8th, all labs will be drawn in the cancer center.  You do not have to check in or register with the main entrance as you have in the past but will complete your check-in at the cancer center.            _____________________________________________________________  Should you have questions after your visit to [facility], please contact our office at [phone] and follow the prompts.  Our office hours are 8:00 a.m. to 4:30 p.m. Monday - Thursday and  8:00 a.m. to 2:30 p.m. Friday.  Please note that voicemails left after 4:00 p.m. may not be returned until the following business day.  We are closed weekends and all major holidays.  You do have access to a nurse 24-7, just call the main number to the clinic [phone] and do not press any options, hold on the line and a nurse will answer the phone.    For prescription refill requests, have your pharmacy contact our office and allow 72 hours.    Masks are no longer required in the cancer centers. If you would like for your care team to wear a mask while they are taking care of you, please let them know. You may have one support person who is at least 60 years old accompany you for your appointments.

## 2023-10-04 DIAGNOSIS — Z1329 Encounter for screening for other suspected endocrine disorder: Secondary | ICD-10-CM | POA: Diagnosis not present

## 2023-10-04 DIAGNOSIS — Z13220 Encounter for screening for lipoid disorders: Secondary | ICD-10-CM | POA: Diagnosis not present

## 2023-10-04 DIAGNOSIS — E119 Type 2 diabetes mellitus without complications: Secondary | ICD-10-CM | POA: Diagnosis not present

## 2023-10-04 DIAGNOSIS — R7989 Other specified abnormal findings of blood chemistry: Secondary | ICD-10-CM | POA: Diagnosis not present

## 2023-10-12 DIAGNOSIS — G4730 Sleep apnea, unspecified: Secondary | ICD-10-CM | POA: Diagnosis not present

## 2023-10-12 DIAGNOSIS — E119 Type 2 diabetes mellitus without complications: Secondary | ICD-10-CM | POA: Diagnosis not present

## 2023-10-12 DIAGNOSIS — K219 Gastro-esophageal reflux disease without esophagitis: Secondary | ICD-10-CM | POA: Diagnosis not present

## 2023-10-12 DIAGNOSIS — J4520 Mild intermittent asthma, uncomplicated: Secondary | ICD-10-CM | POA: Diagnosis not present

## 2023-10-12 DIAGNOSIS — Z6831 Body mass index (BMI) 31.0-31.9, adult: Secondary | ICD-10-CM | POA: Diagnosis not present

## 2023-10-20 ENCOUNTER — Encounter (HOSPITAL_COMMUNITY): Payer: Self-pay | Admitting: Physician Assistant

## 2023-10-20 ENCOUNTER — Ambulatory Visit (HOSPITAL_COMMUNITY)
Admission: RE | Admit: 2023-10-20 | Discharge: 2023-10-20 | Disposition: A | Discharge status: Home / Self Care | Source: Ambulatory Visit | Attending: Physician Assistant | Admitting: Physician Assistant

## 2023-10-20 VITALS — BP 128/82 | HR 65 | Ht 73.0 in | Wt 235.6 lb

## 2023-10-20 DIAGNOSIS — D6869 Other thrombophilia: Secondary | ICD-10-CM | POA: Diagnosis not present

## 2023-10-20 DIAGNOSIS — Z79899 Other long term (current) drug therapy: Secondary | ICD-10-CM | POA: Diagnosis not present

## 2023-10-20 DIAGNOSIS — Z5181 Encounter for therapeutic drug level monitoring: Secondary | ICD-10-CM | POA: Diagnosis not present

## 2023-10-20 DIAGNOSIS — I4819 Other persistent atrial fibrillation: Secondary | ICD-10-CM

## 2023-10-20 NOTE — Progress Notes (Addendum)
 Primary Care Physician: Vida Mardy DEL, PA-C Primary Cardiologist: Diannah SHAUNNA Maywood, MD Electrophysiologist: Eulas FORBES Furbish, MD  Referring Physician: Dr Kelsie Lynwood LITTIE [place] is a 58 y.o. male with a history of OSA, CAD, HLD, HTN, DM, WPW, atrial fibrillation who presents for follow up in the [facility].  He is s/p WPW ablation in 2001 and afib ablation in 2014 and 2015. The patient has been maintained on dofetilide  for rhythm control. Patient is on Xarelto  for stroke prevention. He was seen by Dr Furbish on 09/16/23 and his dofetilide  was decreased due to prolonged QT.    Patient presents today for follow up for atrial fibrillation and dofetilide  monitoring. He reports that he has done well since his last visit. He denies any interim symptoms of afib. No bleeding issues on anticoagulation.   Today, he denies symptoms of palpitations, chest pain, shortness of breath, orthopnea, PND, lower extremity edema, dizziness, presyncope, syncope, bleeding, or neurologic sequela. The patient is tolerating medications without difficulties and is otherwise without complaint today.    Atrial Fibrillation Risk Factors:  he does have symptoms or diagnosis of sleep apnea. he does not have a history of rheumatic fever.   Atrial Fibrillation Management history:  Previous antiarrhythmic drugs: dofetilide   Previous cardioversions: several, most recently 2018 Previous ablations: WPW 2001, 2014, 2015 Anticoagulation history: Xarelto   ROS- All systems are reviewed and negative except as per the HPI above.  Past Medical History:  Diagnosis Date   A-fib [facility])    Allergy    All my life   Anemia    iron  def anemia   Arthritis    Asthma    Coronary artery disease    Dysrhythmia    in and out Afib   GERD (gastroesophageal reflux disease)    Headache    High cholesterol    History of DVT (deep vein thrombosis)    a. left leg following ablation for SVT    Hypertension    Persistent atrial fibrillation (HCC)    a. Dx 08/2012, xarelto  initiated; b. 09/2012 Tikosyn  initiated -> DCCV -> Sinus c. PVI 03/2013 d. 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 8 g 0   amLODipine (NORVASC) 5 MG tablet Take 5 mg by mouth daily.     atorvastatin  (LIPITOR) 40 MG tablet Take 1 tablet (40 mg total) by mouth daily. 90 tablet 3   azelastine  (ASTELIN ) 0.1 % nasal spray Place 1 spray into both nostrils 2 (two) times daily as needed for rhinitis or allergies.     benzonatate  (TESSALON ) 100 MG capsule Take 100 mg by mouth.     blood glucose meter kit and supplies Dispense based on patient and insurance preference. Use to check blood sugar once a day(FOR ICD-10 E10.9, E11.9). 1 each 0   Blood Glucose Monitoring Suppl (CONTOUR NEXT MONITOR) w/Device KIT as directed per package instructions twice a day     budesonide-formoterol  (SYMBICORT) 160-4.5 MCG/ACT inhaler 1 puff as needed Inhalation every 4  hrs     cetirizine (ZYRTEC) 10 MG tablet Take 10 mg by mouth daily.     Cholecalciferol  (VITAMIN D ) 2000 UNITS CAPS Take 4,000 Units by mouth daily.      dexlansoprazole  (DEXILANT ) 60 MG capsule Take 1 capsule (60 mg total) by mouth daily. 90 capsule 1   dofetilide  (TIKOSYN ) 250 MCG capsule Take 1 capsule (250 mcg total) by mouth 2 (two) times daily. 60 capsule 6   enalapril  (VASOTEC ) 10 MG tablet Take 1 tablet (10 mg total) by mouth 2 (two) times daily. 180 tablet 3   fluticasone  (FLONASE) 50 MCG/ACT nasal spray Place 1 spray into both nostrils daily  as needed for allergies.      Fluticasone -Salmeterol (ADVAIR DISKUS) 250-50 MCG/DOSE AEPB Inhale 1 puff into the lungs every 12 (twelve) hours. 60 each 0   furosemide  (LASIX ) 40 MG tablet Take 1 tablet (40 mg total) by mouth as needed.     glipiZIDE (GLUCOTROL XL) 5 MG 24 hr tablet Take 5 mg by mouth daily.     glucose blood (CONTOUR NEXT TEST) test strip as directed per package instructions In Vitro twice a day     hydrOXYzine  (VISTARIL ) 25 MG capsule Take 2 capsules (50 mg total) by mouth 3 (three) times daily as needed for anxiety. 30 capsule 0   loratadine (CLARITIN) 10 MG tablet Take 10 mg by mouth daily as needed for allergies.      Magnesium  Oxide 250 MG TABS 1 tablet Orally Four times a day     meloxicam  (MOBIC ) 15 MG tablet Take 15 mg by mouth daily as needed for pain.     metFORMIN  (GLUCOPHAGE ) 1000 MG tablet TAKE 1 TABLET BY MOUTH TWICE  DAILY WITH MEALS 30 tablet 0   metoprolol  tartrate (LOPRESSOR ) 50 MG tablet Take 50 mg by mouth daily.     MOUNJARO 2.5 MG/0.5ML Pen Inject 2.5 mg into the skin once a week.     nitroGLYCERIN  (NITROSTAT ) 0.4 MG SL tablet DISSOLVE 1 TABLET UNDER THE TONGUE EVERY 5 MINUTES AS  NEEDED FOR CHEST PAIN. MAX  OF 3 TABLETS IN 15 MINUTES. CALL 911 IF PAIN PERSISTS. 25 tablet 1   ondansetron  (ZOFRAN ) 4 MG tablet TAKE 1 TABLET BY MOUTH EVERY 8 HOURS AS NEEDED FOR NAUSEA AND VOMITING 30 tablet 0   oxybutynin  (DITROPAN ) 5 MG tablet TAKE ONE-HALF TABLET (2.5mg ) BY  MOUTH TWO TIMES DAILY AS  NEEDED FOR BLADDER SPASMS 30 tablet 5   rivaroxaban  (XARELTO ) 20 MG TABS tablet TAKE 1 TABLET BY MOUTH DAILY WITH SUPPER 30 tablet 6   Simethicone  80 MG TABS Take 1 tablet (80 mg total) by mouth every 6 (six) hours as needed. 60 tablet 1   vitamin B-12 (CYANOCOBALAMIN ) 500 MCG tablet Take 500 mcg by mouth daily.     No current facility-administered medications for this encounter.    Physical Exam: BP 128/82   Pulse 65   Ht 6' 1 (1.854 m)   Wt 106.9 kg   BMI 31.08 kg/m    GEN: Well nourished, well developed in no acute distress CARDIAC: Regular rate and rhythm, no murmurs, rubs, gallops RESPIRATORY:  Clear to auscultation without rales, wheezing or rhonchi  ABDOMEN: Soft, non-tender, non-distended EXTREMITIES:  No edema; No deformity   Wt Readings from Last 3 Encounters:  10/20/23 106.9 kg  09/28/23 110.2 kg  09/16/23 109.3 kg     EKG today demonstrates  SR Vent. rate 65 BPM PR interval 132 ms QRS duration 94  ms QT/QTcB 456/474 ms   Echo 06/05/20 demonstrated   1. Left ventricular ejection fraction, by estimation, is 60 to 65%. The  left ventricle has normal function. The left ventricle has no regional  wall motion abnormalities. Left ventricular diastolic parameters were  normal. The average left ventricular global longitudinal strain is -18.0 %. The global longitudinal strain is normal.   2. Right ventricular systolic function is normal. The right ventricular  size is normal.   3. The mitral valve is normal in structure. No evidence of mitral valve  regurgitation. No evidence of mitral stenosis.   4. The aortic valve is tricuspid. Aortic valve regurgitation is not  visualized. No aortic stenosis is present.   Comparison(s): Echocardiogram done 10/14/18 showed an EF of 60-65%.    CHA2DS2-VASc Score = 4  The patient's score is based upon: CHF History: 1 HTN History: 1 Diabetes History: 1 Stroke History: 0 Vascular Disease History: 1 Age Score: 0 Gender Score: 0       ASSESSMENT AND PLAN: Persistent Atrial Fibrillation (ICD10:  I48.19) The patient's CHA2DS2-VASc score is 4, indicating a 4.8% annual risk of stroke.   Patient appears to be maintaining SR. QT improved (458 ms measured manually) Continue dofetilide  250 mcg BID Continue Lopressor  50 mg daily Continue Xarelto  20 mg daily  Secondary Hypercoagulable State (ICD10:  D68.69) The patient is at significant risk for stroke/thromboembolism based upon his CHA2DS2-VASc Score of  4.  Continue Rivaroxaban  (Xarelto ). No bleeding issues.   High Risk Medication Monitoring (ICD 10: Z79.899) QT interval on ECG acceptable for dofetilide  monitoring. Patient had recent labs at his PCP, will request copy.   CAD No anginal symptoms Followed by Dr Stacia  HTN Stable on current regimen  OSA  Encouraged nightly CPAP   Follow up with Dr Nancey in 6 months.       [facility] [address] [place], [place] [address] [phone]

## 2023-10-22 ENCOUNTER — Other Ambulatory Visit (HOSPITAL_COMMUNITY): Payer: Self-pay | Admitting: *Deleted

## 2023-11-01 DIAGNOSIS — Z6830 Body mass index (BMI) 30.0-30.9, adult: Secondary | ICD-10-CM | POA: Diagnosis not present

## 2023-11-01 DIAGNOSIS — E119 Type 2 diabetes mellitus without complications: Secondary | ICD-10-CM | POA: Diagnosis not present

## 2023-11-01 DIAGNOSIS — I480 Paroxysmal atrial fibrillation: Secondary | ICD-10-CM | POA: Diagnosis not present

## 2023-11-01 DIAGNOSIS — Z20828 Contact with and (suspected) exposure to other viral communicable diseases: Secondary | ICD-10-CM | POA: Diagnosis not present

## 2023-11-05 ENCOUNTER — Other Ambulatory Visit: Payer: Self-pay | Admitting: Medical Genetics

## 2023-11-18 ENCOUNTER — Other Ambulatory Visit

## 2023-11-18 DIAGNOSIS — Z006 Encounter for examination for normal comparison and control in clinical research program: Secondary | ICD-10-CM

## 2023-11-22 ENCOUNTER — Ambulatory Visit (INDEPENDENT_AMBULATORY_CARE_PROVIDER_SITE_OTHER): Admitting: Gastroenterology

## 2023-11-22 ENCOUNTER — Encounter (INDEPENDENT_AMBULATORY_CARE_PROVIDER_SITE_OTHER): Payer: Self-pay | Admitting: Gastroenterology

## 2023-11-22 VITALS — BP 102/63 | HR 79 | Temp 97.9°F | Ht 73.0 in | Wt 227.8 lb

## 2023-11-22 DIAGNOSIS — D490 Neoplasm of unspecified behavior of digestive system: Secondary | ICD-10-CM

## 2023-11-22 DIAGNOSIS — Z87891 Personal history of nicotine dependence: Secondary | ICD-10-CM | POA: Diagnosis not present

## 2023-11-22 DIAGNOSIS — Z85020 Personal history of malignant carcinoid tumor of stomach: Secondary | ICD-10-CM

## 2023-11-22 DIAGNOSIS — K219 Gastro-esophageal reflux disease without esophagitis: Secondary | ICD-10-CM

## 2023-11-22 MED ORDER — DEXLANSOPRAZOLE 60 MG PO CPDR: 60.0000 mg | DELAYED_RELEASE_CAPSULE | Freq: Every day | ORAL | 1 refills | Status: AC

## 2023-11-22 NOTE — Progress Notes (Signed)
 Referring Provider: Vida Mardy VEAR DEVONNA Primary Care Physician:  Vida Mardy VEAR, PA-C Primary GI Physician: Dr. Eartha   Chief Complaint  Patient presents with   Follow-up    Pt arrives for follow up. Pt states he is not doing any better. Pt is ready for surgery.    HPI:   Austin Woods is a 58 y.o. male with past medical history of  A-fib, asthma, iron -deficiency anemia, GERD s/p Nissen fundoplication, hypertension, diabetes, Wolff-Parkinson-White status post ablation, coronary artery disease, OSA   Patient presenting today for:  Follow up of GERD History of gastric carcinoid tumor  Last seen April, at that time doing voquezna  10mg  daily, no improvement. Daily acid regurgitation, cough. Some nausea but no vomiting. Taking gas pills PRN. Upcoming imaging with oncology  Recommended to continue to follow with oncology, repeat EUS November, stop voquezna , start dexilant , referral to [facility] for NF redo  Recent PET scan 09/10/23  Partial gastrectomy for carcinoid tumor of the proximal stomach. A focus of radiotracer activity in the distal gastric body is indeterminate 2. No evidence of metastatic adenopathy in the upper abdomen. 3. No evidence of liver metastasis. 4. No distant metastatic disease.  Chromagranin A 1441, Gastrin 1808  Seen by oncology on 6/24, Elevated gastrin thought secondary to dexilant , recommended repeat tumor markers, PET in 6 months, if any worsening, referral to Dr. Tommye for EGD/EUS, if no worsening consider dotate PET/tumor markers yearly  Present: States GERD persisted on dexilant  60mg , maybe slightly better. Has a cough for the past few days so thinks it may be sinus issues. He has started on mounjaro and feels his stomach issues has improved some. Appetite down some on mounjaro, less bubbling in his stomach. Has not heard anything from [facility] regarding referral for redo of his nissen. He has follow up with oncology in December. States he did not  realize he had a tumor in his stomach until his last visit with oncology. No dysphagia. Has lost about 8 pounds since starting mounjaro. Has upcoming evaluation with his endocrinologist coming up.   No red flag symptoms. Patient denies melena, hematochezia, nausea, vomiting, diarrhea, constipation, dysphagia, odyonophagia, early satiety or weight loss.   Pertinent history: EGD/EUS in April 2024, no gross lesions, 3 submucosal papules in atnerior portion of stomach, antral gastropathy and gastritis, no lesion in duodenum, EUS no overt lesions seen..   Patient advised to continue voquezna , start carafate , repeat EUS 1 year   Was also advised to have PET dotate scan and referall to gen surg for hernia repair    PET 10/12/22  Increased radiotracer uptake is identified within the wall of the proximal stomach just below the GE junction, nonspecific. Correlate with endoscopic findings. No signs of tracer avid nodal metastasis or solid organ metastasis. New indeterminate nodule within the right lung base without corresponding increased tracer uptake measures 1.0 cm. Recommend follow-up imaging in 3 months with repeat diagnostic CT of the chest to confirm persistence.  Increased radiotracer activity within the prostate gland is identified with SUV max of 8.70. Recommend correlation with PSA values.   Advised to have repeat EUS after PET   EGD/EUS 02/12/23: EGD Impression:  - No gross mucosal lesions in the entire esophagus. Tortuous esophagus distally. Z-line regular, 41 cm   from the incisors. - A Nissen fundoplication was found, characterized by a non-intact appearance. - Three gastric polyps. Resected and retrieved. Clips (MR conditional) were placed.  - Erosive gastritis in antrum. - Granular gastric mucosa  throughout. Biopsied. - Hemoclip on anterior body from prior resection. - No gross lesions in the duodenal bulb, in the first portion of the duodenum and in the second  portion of the  duodenum. EUS Impression:  - Endosonographic images of the stomach were unremarkable. - No malignant-appearing lymph nodes were    visualized in the celiac region (level 20),  peripancreatic region and porta hepatis region. A. STOMACH, CARDIA, GREATER CURVE, POSTERIOR, POLYPECTOMY:  -  Antral/oxyntic type mucosa with foveolar hyperplasia and lamina  propria edema consistent with- Hyperplastic polyp.  - No dysplasia or malignancy.  In the background of mild to moderate  chronic inactive gastritis and linear and nodular neuroendocrine cell  hyperplasia.  B. STOMACH, RANDOM, BIOPSY:  -  Antral/oxyntic mucosa with chemical/reactive gastropathy and mild to  moderate chronic inactive gastritis with mild linear and focal nodular  neuroendocrine cell hyperplasia and evidence of glandular dropout  suggestive of atrophy.    Patient saw oncology in December who recommended repeat PET dotate, gastrin and chromgranin again in 6 months   Repeat EUS 1 year (nov 2025)   CT A/P 05/20/22: No acute intra-abdominal or pelvic pathology. No evidence of metastatic disease in the abdomen or pelvis. 2. Possible mild gastric pneumatosis of indeterminate etiology and may be related to recent instrumentation. No inflammatory changes or gastric wall thickening. No free air or portal venous gas. 3. Fatty liver.   Last Colonoscopy: 04/2019 - Perianal skin tags found on perianal exam. - The examined portion of the ileum was normal. - Two diminutive polyps at the splenic flexure and in the ascending colon. Biopsied. - Diverticulosis at the hepatic flexure. - External hemorrhoids.   Path: COLON, SPLENIC FLEXURE, ASCENDING, POLYPECTOMY:  - Tubular adenoma without high-grade dysplasia or malignancy  - Other fragment of polypoid colonic mucosa with no specific  histopathologic changes    Recommended repeat in 7 years.   Last Endoscopy: 04/2022- 1 cm hiatal hernia.                           - Nissen  fundoplication was found, characterized by                            loose wrap.                           - Gastroesophageal flap valve classified as Hill                            Grade II (fold present, opens with respiration).                           - A single mucosal papule (nodule) found in the                            stomach. Query GIST? Biopsied-neuroendocrine tumor                           - Normal examined duodenum.   Filed Weights   11/22/23 1038  Weight: 227 lb 12.8 oz (103.3 kg)     Past Medical History:  Diagnosis Date   A-fib [facility])    Allergy    All my  life   Anemia    iron  def anemia   Arthritis    Asthma    Coronary artery disease    Dysrhythmia    in and out Afib   GERD (gastroesophageal reflux disease)    Headache    High cholesterol    History of DVT (deep vein thrombosis)    a. left leg following ablation for SVT   Hypertension    Persistent atrial fibrillation (HCC)    a. Dx 08/2012, xarelto  initiated; b. 09/2012 Tikosyn  initiated -> DCCV -> Sinus c. PVI 03/2013 d. 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   Type 2 diabetes mellitus without complication, without long-term current use of insulin (HCC) 02/16/2018   Wolff-Parkinson-White (WPW) syndrome    resolved s/p RFCA 2001 by Dr Fernande    Past Surgical History:  Procedure Laterality Date   ATRIAL FIBRILLATION ABLATION  03/23/2013   PVI by DR KELSIE for afib   ATRIAL FIBRILLATION ABLATION N/A 03/23/2013   Procedure: ATRIAL FIBRILLATION ABLATION;  Surgeon: Lynwood JONETTA KELSIE, MD;  Location: [facility] CATH LAB;  Service: Cardiovascular;  Laterality: N/A;   ATRIAL FIBRILLATION ABLATION N/A 03/27/2014   PVI Dr KELSIE SANES SURGERY     BIOPSY  02/28/2020   Procedure: BIOPSY;  Surgeon: [name], MD;  Location: [facility] ENDO [facility];  Service: Endoscopy;;  antral(pre-pyloric)    BIOPSY  04/16/2022   Procedure: BIOPSY;  Surgeon: [name], MD;  Location: [facility]  END[facility];  Service: Gastroenterology;;   BIOPSY  07/16/2022   Procedure: BIOPSY;  Surgeon: [name], MD;  Location: [facility];  Service: Gastroenterology;;   BIOPSY  02/22/2023   Procedure: BIOPSY;  Surgeon: [name], MD;  Location: [facility];  Service: Gastroenterology;;   CARDIAC CATHETERIZATION N/A 02/03/2016   Procedure: Left Heart Cath and Coronary Angiography;  Surgeon: [name], MD;  Location: [facility] INVASIVE CV LAB;  Service: Cardiovascular;  Laterality: N/A;   CARDIAC CATHETERIZATION N/A 02/03/2016   Procedure: Intravascular Pressure Wire/FFR Study;  Surgeon: [name], MD;  Location: [facility] INVASIVE CV LAB;  Service: Cardiovascular;  Laterality: N/A;   CARDIAC CATHETERIZATION N/A 02/03/2016   Procedure: Coronary Stent Intervention;  Surgeon: [name], MD;  Location: [facility] INVASIVE CV LAB;  Service: Cardiovascular;  Laterality: N/A;   CARDIAC SURGERY     CARDIOVERSION N/A 08/25/2012   Procedure: CARDIOVERSION;  Surgeon: [name], MD;  Location: [facility] ENDOSCOPY;  Service: Cardiovascular;  Laterality: N/A;   CARDIOVERSION N/A 09/06/2012   Procedure: CARDIOVERSION/Bedside;  Surgeon: [name], MD;  Location: [facility] OR;  Service: Cardiovascular;  Laterality: N/A;   CARDIOVERSION N/A 01/12/2014   Procedure: CARDIOVERSION;  Surgeon: [name], MD;  Location: [facility] ENDOSCOPY;  Service: Cardiovascular;  Laterality: N/A;   CARDIOVERSION N/A 01/30/2016   Procedure: CARDIOVERSION;  Surgeon: [name], MD;  Location: [facility] ENDOSCOPY;  Service: Cardiovascular;  Laterality: N/A;   CARDIOVERSION N/A 04/14/2016   Procedure: CARDIOVERSION;  Surgeon: [name], MD;  Location: [facility] ENDOSCOPY;  Service: Cardiovascular;  Laterality: N/A;   COLONOSCOPY N/A 12/04/2015   Procedure: COLONOSCOPY;  Surgeon: Claudis PENNER Golda, MD;  [facility];  Service: Endoscopy;  Laterality: N/A;  730   COLONOSCOPY WITH PROPOFOL  N/A 04/28/2019    Procedure: COLONOSCOPY WITH PROPOFOL ;  Surgeon: [name], MD;  [facility];  Service: Endoscopy;  Laterality: N/A;   CORONARY ANGIOPLASTY  CORONARY ANGIOPLASTY WITH STENT PLACEMENT     ELECTROPHYSIOLOGIC STUDY N/A 02/18/2016   Procedure: Atrial Fibrillation Ablation;  Surgeon: [name], MD;  Location: [facility] INVASIVE CV LAB;  Service: Cardiovascular;  Laterality: N/A;   ENDOSCOPIC MUCOSAL RESECTION N/A 07/16/2022   Procedure: ENDOSCOPIC MUCOSAL RESECTION;  Surgeon: [name], MD;  Location: [facility] ENDOSCOPY;  Service: Gastroenterology;  Laterality: N/A;   ENDOSCOPIC MUCOSAL RESECTION  02/22/2023   Procedure: ENDOSCOPIC MUCOSAL RESECTION;  Surgeon: [name], MD;  Location: [facility] ENDOSCOPY;  Service: Gastroenterology;;   ESOPHAGOGASTRODUODENOSCOPY N/A 02/22/2023   Procedure: ESOPHAGOGASTRODUODENOSCOPY (EGD);  Surgeon: [name], MD;  Location: [facility];  Service: Gastroenterology;  Laterality: N/A;   ESOPHAGOGASTRODUODENOSCOPY (EGD) WITH PROPOFOL  N/A 04/28/2019   Procedure: ESOPHAGOGASTRODUODENOSCOPY (EGD) WITH PROPOFOL ;  Surgeon: [name], MD;  [facility];  Service: Endoscopy;  Laterality: N/A;  7:30   ESOPHAGOGASTRODUODENOSCOPY (EGD) WITH PROPOFOL  N/A 02/28/2020   Procedure: ESOPHAGOGASTRODUODENOSCOPY (EGD) WITH PROPOFOL ;  Surgeon: [name], MD;  [facility];  Service: Endoscopy;  Laterality: N/A;  1245   ESOPHAGOGASTRODUODENOSCOPY (EGD) WITH PROPOFOL  N/A 04/16/2022   Procedure: ESOPHAGOGASTRODUODENOSCOPY (EGD) WITH PROPOFOL ;  Surgeon: [name], MD;  [facility];  Service: Gastroenterology;  Laterality: N/A;  730am, asa 1-2   ESOPHAGOGASTRODUODENOSCOPY (EGD) WITH PROPOFOL  N/A 07/16/2022   Procedure: ESOPHAGOGASTRODUODENOSCOPY (EGD) WITH PROPOFOL ;  Surgeon: [name], MD;  Location: [facility] ENDOS[facility];  Service: Gastroenterology;  Laterality: N/A;   EUS N/A  07/16/2022   Procedure: UPPER ENDOSCOPIC ULTRASOUND (EUS) RADIAL;  Surgeon: [name], MD;  Location: [facility] ENDOSCOPY;  Service: Gastroenterology;  Laterality: N/A;   EUS N/A 02/22/2023   Procedure: UPPER ENDOSCOPIC ULTRASOUND (EUS) RADIAL;  Surgeon: [name], MD;  Location: [facility] ENDOS[facility];  Service: Gastroenterology;  Laterality: N/A;   HEMOSTASIS CLIP PLACEMENT  07/16/2022   Procedure: HEMOSTASIS CLIP PLACEMENT;  Surgeon: [name], MD;  Location: [facility];  Service: Gastroenterology;;   HEMOSTASIS CLIP PLACEMENT  02/22/2023   Procedure: HEMOSTASIS CLIP PLACEMENT;  Surgeon: [name], MD;  Location: [facility]OS[facility];  Service: Gastroenterology;;   HERNIA REPAIR     10 years   implantable loop recorder removal  03/01/2019   MDT Reveal LINQ removed in office   INGUINAL HERNIA REPAIR Bilateral 06/04/2017   Procedure: OPEN BILATERAL INGUINAL HERNIA REPAIR;  Surgeon: Kimble Lynwood, MD;  Location: [facility] OR;  Service: General;  Laterality: Bilateral;   LOOP RECORDER IMPLANT N/A 06/29/2014   Procedure: LOOP RECORDER IMPLANT;  Surgeon: [name], MD;  Location: [facility] CATH LAB;  Service: Cardiovascular;  Laterality: N/A;   NOSE SURGERY     sleep apnea surgery   PERIPHERAL VASCULAR CATHETERIZATION N/A 03/13/2016   Procedure: Thrombin  Injection;  Surgeon: Gaile LELON New, MD;  Location: [facility] INVASIVE CV LAB;  Service: Cardiovascular;  Laterality: N/A;   POLYPECTOMY  04/28/2019   Procedure: POLYPECTOMY;  Surgeon: [name], MD;  [facility];  Service: Endoscopy;;  duodenum;splenic flexure;   SPINE SURGERY     10 years agao   STOMACH SURGERY     reflux, fundoplication   SUBMUCOSAL LIFTING INJECTION  07/16/2022   Procedure: SUBMUCOSAL LIFTING INJECTION;  Surgeon: [name], MD;  Location: [facility];  Service: Gastroenterology;;   SUBMUCOSAL LIFTING INJECTION  02/22/2023   Procedure: SUBMUCOSAL LIFTING INJECTION;   Surgeon: [name], MD;  Location: [facility];  Service: Gastroenterology;;   TEE WITHOUT CARDIOVERSION N/A 08/25/2012   Procedure: TRANSESOPHAGEAL  ECHOCARDIOGRAM (TEE);  Surgeon: [name], MD;  Location: [facility] ENDOSCOPY;  Service: Cardiovascular;  Laterality: N/A;   TEE WITHOUT CARDIOVERSION  03/23/2013   DR ROSS   TEE WITHOUT CARDIOVERSION N/A 03/23/2013   Procedure: TRANSESOPHAGEAL ECHOCARDIOGRAM (TEE);  Surgeon: Vina LULLA Gull, MD;  Location: [facility] ENDOSCOPY;  Service: Cardiovascular;  Laterality: N/A;   TEE WITHOUT CARDIOVERSION N/A 03/26/2014   Procedure: TRANSESOPHAGEAL ECHOCARDIOGRAM (TEE);  Surgeon: Maude JAYSON Emmer, MD;  Location: [facility] ENDOSCOPY;  Service: Cardiovascular;  Laterality: N/A;   TONSILLECTOMY      Current Outpatient Medications  Medication Sig Dispense Refill   albuterol  (VENTOLIN  HFA) 108 (90 Base) MCG/ACT inhaler Inhale 2 puffs into the lungs every 6 (six) hours as needed for wheezing or shortness of breath. 8 g 0   amLODipine (NORVASC) 5 MG tablet Take 5 mg by mouth daily.     atorvastatin  (LIPITOR) 40 MG tablet Take 1 tablet (40 mg total) by mouth daily. 90 tablet 3   azelastine  (ASTELIN ) 0.1 % nasal spray Place 1 spray into both nostrils 2 (two) times daily as needed for rhinitis or allergies.     benzonatate  (TESSALON ) 100 MG capsule Take 100 mg by mouth.     blood glucose meter kit and supplies Dispense based on patient and insurance preference. Use to check blood sugar once a day(FOR ICD-10 E10.9, E11.9). 1 each 0   Blood Glucose Monitoring Suppl (CONTOUR NEXT MONITOR) w/Device KIT as directed per package instructions twice a day     budesonide-formoterol  (SYMBICORT) 160-4.5 MCG/ACT inhaler 1 puff as needed Inhalation every 4 hrs     cetirizine (ZYRTEC) 10 MG tablet Take 10 mg by mouth daily.     Cholecalciferol  (VITAMIN D ) 2000 UNITS CAPS Take 4,000 Units by mouth daily.      dexlansoprazole  (DEXILANT ) 60 MG capsule Take 1 capsule (60 mg total) by  mouth daily. 90 capsule 1   dofetilide  (TIKOSYN ) 250 MCG capsule Take 1 capsule (250 mcg total) by mouth 2 (two) times daily. 60 capsule 6   enalapril  (VASOTEC ) 10 MG tablet Take 1 tablet (10 mg total) by mouth 2 (two) times daily. 180 tablet 3   fluticasone  (FLONASE) 50 MCG/ACT nasal spray Place 1 spray into both nostrils daily as needed for allergies.      Fluticasone -Salmeterol (ADVAIR DISKUS) 250-50 MCG/DOSE AEPB Inhale 1 puff into the lungs every 12 (twelve) hours. 60 each 0   furosemide  (LASIX ) 40 MG tablet Take 1 tablet (40 mg total) by mouth as needed.     glipiZIDE (GLUCOTROL XL) 5 MG 24 hr tablet Take 5 mg by mouth daily.     glucose blood (CONTOUR NEXT TEST) test strip as directed per package instructions In Vitro twice a day     hydrOXYzine  (VISTARIL ) 25 MG capsule Take 2 capsules (50 mg total) by mouth 3 (three) times daily as needed for anxiety. 30 capsule 0   loratadine (CLARITIN) 10 MG tablet Take 10 mg by mouth daily as needed for allergies.      Magnesium  Oxide 250 MG TABS 1 tablet Orally Four times a day     meloxicam  (MOBIC ) 15 MG tablet Take 15 mg by mouth daily as needed for pain.     metFORMIN  (GLUCOPHAGE ) 1000 MG tablet TAKE 1 TABLET BY MOUTH TWICE  DAILY WITH MEALS 30 tablet 0   metoprolol  tartrate (LOPRESSOR ) 50 MG tablet Take 50 mg by mouth daily.     MOUNJARO 2.5 MG/0.5ML Pen Inject 2.5 mg into the  skin once a week.     nitroGLYCERIN  (NITROSTAT ) 0.4 MG SL tablet DISSOLVE 1 TABLET UNDER THE TONGUE EVERY 5 MINUTES AS  NEEDED FOR CHEST PAIN. MAX  OF 3 TABLETS IN 15 MINUTES. CALL 911 IF PAIN PERSISTS. 25 tablet 1   ondansetron  (ZOFRAN ) 4 MG tablet TAKE 1 TABLET BY MOUTH EVERY 8 HOURS AS NEEDED FOR NAUSEA AND VOMITING 30 tablet 0   oxybutynin  (DITROPAN ) 5 MG tablet TAKE ONE-HALF TABLET (2.5mg ) BY  MOUTH TWO TIMES DAILY AS  NEEDED FOR BLADDER SPASMS 30 tablet 5   rivaroxaban  (XARELTO ) 20 MG TABS tablet TAKE 1 TABLET BY MOUTH DAILY WITH SUPPER 30 tablet 6   Simethicone  80 MG  TABS Take 1 tablet (80 mg total) by mouth every 6 (six) hours as needed. 60 tablet 1   vitamin B-12 (CYANOCOBALAMIN ) 500 MCG tablet Take 500 mcg by mouth daily.     No current facility-administered medications for this visit.    Allergies as of 11/22/2023   (No Known Allergies)    Social History   Socioeconomic History   Marital status: Married    Spouse name: Not on file   Number of children: 2   Years of education: Not on file   Highest education level: Not on file  Occupational History    Employer: [employer]  Tobacco Use   Smoking status: Former    Current packs/day: 0.00    Average packs/day: 1 pack/day for 7.2 years (7.2 ttl pk-yrs)    Types: Cigarettes    Start date: 05/19/2003    Quit date: 08/11/2012    Years since quitting: 11.2   Smokeless tobacco: Never   Tobacco comments:    Former smoker 10/20/23  Vaping Use   Vaping status: Never Used  Substance and Sexual Activity   Alcohol  use: No   Drug use: No   Sexual activity: Yes    Birth control/protection: None  Other Topics Concern   Not on file  Social History Narrative   Pt lives in [place] with wife.  Works at [employer]: Not on [employer] Insecurity: Not on file  Transportation Needs: Not on file  Physical Activity: Not on file  Stress: Not on file  Social Connections: Not on file    Review of systems General: negative for malaise, night sweats, fever, chills, weight loss Neck: Negative for lumps, goiter, pain and significant neck swelling Resp: Negative for cough, wheezing, dyspnea at rest CV: Negative for chest pain, leg swelling, palpitations, orthopnea GI: denies melena, hematochezia, nausea, vomiting, diarrhea, constipation, dysphagia, odyonophagia, early satiety or unintentional weight loss. +GERD symptoms  MSK: Negative for joint pain or swelling, back pain, and muscle pain. Derm: Negative for itching or rash Psych: Denies depression, anxiety,  memory loss, confusion. No homicidal or suicidal ideation.  Heme: Negative for prolonged bleeding, bruising easily, and swollen nodes. Endocrine: Negative for cold or heat intolerance, polyuria, polydipsia and goiter. Neuro: negative for tremor, gait imbalance, syncope and seizures. The remainder of the review of systems is noncontributory.  Physical Exam: BP 102/63   Pulse 79   Temp 97.9 F (36.6 C)   Ht 6' 1 (1.854 m)   Wt 227 lb 12.8 oz (103.3 kg)   BMI 30.05 kg/m  General:   Alert and oriented. No distress noted. Pleasant and cooperative.  Head:  Normocephalic and atraumatic. Eyes:  Conjuctiva clear without scleral icterus. Mouth:  Oral mucosa pink and  moist. Good dentition. No lesions. Heart: Normal rate and rhythm, s1 and s2 heart sounds present.  Lungs: Clear lung sounds in all lobes. Respirations equal and unlabored. Abdomen:  +BS, soft, non-tender and non-distended. No rebound or guarding. No HSM or masses noted. Derm: No palmar erythema or jaundice Msk:  Symmetrical without gross deformities. Normal posture. Extremities:  Without edema. Neurologic:  Alert and  oriented x4 Psych:  Alert and cooperative. Normal mood and affect.  Invalid input(s): 6 MONTHS   ASSESSMENT: Austin Woods is a 58 y.o. male presenting today for follow up of GERD and history of gastric carcinoid tumor  GERD: history of Nissen fundoplication 20+ years ago, relatively recent upper endoscopy with nonintact wrap noted. He continues to have reflux symptoms on dexilant  though maybe slightly improved. He denies dysphagia or odynophagia. Did not do well on voquezna . He was referred back to [facility] after last visit to have evaluation for possible redo of hi NF but states he has never heard anything from them. We will continue on dexilant  60mg  daily for now and check on referral to [facility].   Gastric carcinoid tumor:  currently following with oncology with recent labs with gastrin elevated, though  thought possibly secondary to use of dexilant , PET scan with  A focus of radiotracer activity in the distal gastric body that was indeterminate, No evidence of metastatic adenopathy in the upper abdomen, No evidence of liver metastasis, No distant metastatic disease. Chromagranin A remains elevated but stable at 1441 (last 1414), oncology recommended repeat labs and imaging in 6 months.  Last EUS done in November without findings of ongoing malignancy.   Was recommended to likely need repeat EUS around November 2025. We will touch base with [facility] regarding recall for EUS/EGD to ensure they have him on their list, should continue to follow with oncology for serial PET/tumor markers at their recommendation.    PLAN:  -check on [facility] referral -ensure on recall for repeat EGD/EUS in November -continue dexilant  60mg  daily  -continue to follow with oncology   All questions were answered, patient verbalized understanding and is in agreement with plan as outlined above.    Follow Up: 4 months   Zayn Selley L. Mariette, MSN, APRN, AGNP-C Adult-Gerontology Nurse Practitioner [facility]  I have reviewed the note and agree with the APP's assessment as described in this progress note  Discussed case with Dr. Wilhelmenia.  He reviewed the images from most recent PET scan and recommended proceeding with esophagogastroduodenospy prior to scheduling endoscopic ultrasound.  I spoke with the patient and he is in agreement to proceed with esophagogastroduodenospy, which will be scheduled.  Toribio Fortune, MD Gastroenterology and Hepatology [facility] Gastroenterology

## 2023-11-22 NOTE — H&P (View-Only) (Signed)
 Referring Provider: Vida Mardy Woods DEVONNA Primary Care Physician:  Austin Mardy VEAR, PA-C Primary GI Physician: Dr. Eartha   Chief Complaint  Patient presents with   Follow-up    Pt arrives for follow up. Pt states he is not doing any better. Pt is ready for surgery.    HPI:   Austin Woods is a 58 y.o. male with past medical history of  A-fib, asthma, iron -deficiency anemia, GERD s/p Nissen fundoplication, hypertension, diabetes, Wolff-Parkinson-White status post ablation, coronary artery disease, OSA   Patient presenting today for:  Follow up of GERD History of gastric carcinoid tumor  Last seen April, at that time doing voquezna  10mg  daily, no improvement. Daily acid regurgitation, cough. Some nausea but no vomiting. Taking gas pills PRN. Upcoming imaging with oncology  Recommended to continue to follow with oncology, repeat EUS November, stop voquezna , start dexilant , referral to [facility] for NF redo  Recent PET scan 09/10/23  Partial gastrectomy for carcinoid tumor of the proximal stomach. A focus of radiotracer activity in the distal gastric body is indeterminate 2. No evidence of metastatic adenopathy in the upper abdomen. 3. No evidence of liver metastasis. 4. No distant metastatic disease.  Chromagranin A 1441, Gastrin 1808  Seen by oncology on 6/24, Elevated gastrin thought secondary to dexilant , recommended repeat tumor markers, PET in 6 months, if any worsening, referral to Dr. Tommye for EGD/EUS, if no worsening consider dotate PET/tumor markers yearly  Present: States GERD persisted on dexilant  60mg , maybe slightly better. Has a cough for the past few days so thinks it may be sinus issues. He has started on mounjaro and feels his stomach issues has improved some. Appetite down some on mounjaro, less bubbling in his stomach. Has not heard anything from [facility] regarding referral for redo of his nissen. He has follow up with oncology in December. States he did not  realize he had a tumor in his stomach until his last visit with oncology. No dysphagia. Has lost about 8 pounds since starting mounjaro. Has upcoming evaluation with his endocrinologist coming up.   No red flag symptoms. Patient denies melena, hematochezia, nausea, vomiting, diarrhea, constipation, dysphagia, odyonophagia, early satiety or weight loss.   Pertinent history: EGD/EUS in April 2024, no gross lesions, 3 submucosal papules in atnerior portion of stomach, antral gastropathy and gastritis, no lesion in duodenum, EUS no overt lesions seen..   Patient advised to continue voquezna , start carafate , repeat EUS 1 year   Was also advised to have PET dotate scan and referall to gen surg for hernia repair    PET 10/12/22  Increased radiotracer uptake is identified within the wall of the proximal stomach just below the GE junction, nonspecific. Correlate with endoscopic findings. No signs of tracer avid nodal metastasis or solid organ metastasis. New indeterminate nodule within the right lung base without corresponding increased tracer uptake measures 1.0 cm. Recommend follow-up imaging in 3 months with repeat diagnostic CT of the chest to confirm persistence.  Increased radiotracer activity within the prostate gland is identified with SUV max of 8.70. Recommend correlation with PSA values.   Advised to have repeat EUS after PET   EGD/EUS 02/12/23: EGD Impression:  - No gross mucosal lesions in the entire esophagus. Tortuous esophagus distally. Z-line regular, 41 cm   from the incisors. - A Nissen fundoplication was found, characterized by a non-intact appearance. - Three gastric polyps. Resected and retrieved. Clips (MR conditional) were placed.  - Erosive gastritis in antrum. - Granular gastric mucosa  throughout. Biopsied. - Hemoclip on anterior body from prior resection. - No gross lesions in the duodenal bulb, in the first portion of the duodenum and in the second  portion of the  duodenum. EUS Impression:  - Endosonographic images of the stomach were unremarkable. - No malignant-appearing lymph nodes were    visualized in the celiac region (level 20),  peripancreatic region and porta hepatis region. A. STOMACH, CARDIA, GREATER CURVE, POSTERIOR, POLYPECTOMY:  -  Antral/oxyntic type mucosa with foveolar hyperplasia and lamina  propria edema consistent with- Hyperplastic polyp.  - No dysplasia or malignancy.  In the background of mild to moderate  chronic inactive gastritis and linear and nodular neuroendocrine cell  hyperplasia.  B. STOMACH, RANDOM, BIOPSY:  -  Antral/oxyntic mucosa with chemical/reactive gastropathy and mild to  moderate chronic inactive gastritis with mild linear and focal nodular  neuroendocrine cell hyperplasia and evidence of glandular dropout  suggestive of atrophy.    Patient saw oncology in December who recommended repeat PET dotate, gastrin and chromgranin again in 6 months   Repeat EUS 1 year (nov 2025)   CT A/P 05/20/22: No acute intra-abdominal or pelvic pathology. No evidence of metastatic disease in the abdomen or pelvis. 2. Possible mild gastric pneumatosis of indeterminate etiology and may be related to recent instrumentation. No inflammatory changes or gastric wall thickening. No free air or portal venous gas. 3. Fatty liver.   Last Colonoscopy: 04/2019 - Perianal skin tags found on perianal exam. - The examined portion of the ileum was normal. - Two diminutive polyps at the splenic flexure and in the ascending colon. Biopsied. - Diverticulosis at the hepatic flexure. - External hemorrhoids.   Path: COLON, SPLENIC FLEXURE, ASCENDING, POLYPECTOMY:  - Tubular adenoma without high-grade dysplasia or malignancy  - Other fragment of polypoid colonic mucosa with no specific  histopathologic changes    Recommended repeat in 7 years.   Last Endoscopy: 04/2022- 1 cm hiatal hernia.                           - Nissen  fundoplication was found, characterized by                            loose wrap.                           - Gastroesophageal flap valve classified as Hill                            Grade II (fold present, opens with respiration).                           - A single mucosal papule (nodule) found in the                            stomach. Query GIST? Biopsied-neuroendocrine tumor                           - Normal examined duodenum.   Filed Weights   11/22/23 1038  Weight: 227 lb 12.8 oz (103.3 kg)     Past Medical History:  Diagnosis Date   A-fib [facility])    Allergy    All my  life   Anemia    iron  def anemia   Arthritis    Asthma    Coronary artery disease    Dysrhythmia    in and out Afib   GERD (gastroesophageal reflux disease)    Headache    High cholesterol    History of DVT (deep vein thrombosis)    a. left leg following ablation for SVT   Hypertension    Persistent atrial fibrillation (HCC)    a. Dx 08/2012, xarelto  initiated; b. 09/2012 Tikosyn  initiated -> DCCV -> Sinus c. PVI 03/2013 d. 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   Type 2 diabetes mellitus without complication, without long-term current use of insulin (HCC) 02/16/2018   Wolff-Parkinson-White (WPW) syndrome    resolved s/p RFCA 2001 by Dr Fernande    Past Surgical History:  Procedure Laterality Date   ATRIAL FIBRILLATION ABLATION  03/23/2013   PVI by DR KELSIE for afib   ATRIAL FIBRILLATION ABLATION N/A 03/23/2013   Procedure: ATRIAL FIBRILLATION ABLATION;  Surgeon: Lynwood JONETTA KELSIE, MD;  Location: [facility] CATH LAB;  Service: Cardiovascular;  Laterality: N/A;   ATRIAL FIBRILLATION ABLATION N/A 03/27/2014   PVI Dr KELSIE SANES SURGERY     BIOPSY  02/28/2020   Procedure: BIOPSY;  Surgeon: [name], MD;  Location: [facility] END[facility];  Service: Endoscopy;;  antral(pre-pyloric)    BIOPSY  04/16/2022   Procedure: BIOPSY;  Surgeon: Austin Woods Angelia Sieving, MD;  Location: [facility]  ENDO [facility];  Service: Gastroenterology;;   BIOPSY  07/16/2022   Procedure: BIOPSY;  Surgeon: [name], MD;  Location: [facility];  Service: Gastroenterology;;   BIOPSY  02/22/2023   Procedure: BIOPSY;  Surgeon: [name], MD;  Location: [facility];  Service: Gastroenterology;;   CARDIAC CATHETERIZATION N/A 02/03/2016   Procedure: Left Heart Cath and Coronary Angiography;  Surgeon: [name], MD;  Location: [facility] INVASIVE CV LAB;  Service: Cardiovascular;  Laterality: N/A;   CARDIAC CATHETERIZATION N/A 02/03/2016   Procedure: Intravascular Pressure Wire/FFR Study;  Surgeon: [name], MD;  Location: [facility] INVASIVE CV LAB;  Service: Cardiovascular;  Laterality: N/A;   CARDIAC CATHETERIZATION N/A 02/03/2016   Procedure: Coronary Stent Intervention;  Surgeon: [name], MD;  Location: [facility] INVASIVE CV LAB;  Service: Cardiovascular;  Laterality: N/A;   CARDIAC SURGERY     CARDIOVERSION N/A 08/25/2012   Procedure: CARDIOVERSION;  Surgeon: [name], MD;  Location: [facility] ENDOSCOPY;  Service: Cardiovascular;  Laterality: N/A;   CARDIOVERSION N/A 09/06/2012   Procedure: CARDIOVERSION/Bedside;  Surgeon: [name], MD;  Location: [facility] OR;  Service: Cardiovascular;  Laterality: N/A;   CARDIOVERSION N/A 01/12/2014   Procedure: CARDIOVERSION;  Surgeon: [name], MD;  Location: [facility] ENDOSCOPY;  Service: Cardiovascular;  Laterality: N/A;   CARDIOVERSION N/A 01/30/2016   Procedure: CARDIOVERSION;  Surgeon: [name], MD;  Location: [facility] ENDOSCOPY;  Service: Cardiovascular;  Laterality: N/A;   CARDIOVERSION N/A 04/14/2016   Procedure: CARDIOVERSION;  Surgeon: [name], MD;  Location: [facility] ENDOSCOPY;  Service: Cardiovascular;  Laterality: N/A;   COLONOSCOPY N/A 12/04/2015   Procedure: COLONOSCOPY;  Surgeon: Claudis PENNER Golda, MD;  [facility];  Service: Endoscopy;  Laterality: N/A;  730   COLONOSCOPY WITH PROPOFOL  N/A 04/28/2019    Procedure: COLONOSCOPY WITH PROPOFOL ;  Surgeon: [name], MD;  [facility];  Service: Endoscopy;  Laterality: N/A;   CORONARY ANGIOPLASTY  CORONARY ANGIOPLASTY WITH STENT PLACEMENT     ELECTROPHYSIOLOGIC STUDY N/A 02/18/2016   Procedure: Atrial Fibrillation Ablation;  Surgeon: [name], MD;  Location: [facility] INVASIVE CV LAB;  Service: Cardiovascular;  Laterality: N/A;   ENDOSCOPIC MUCOSAL RESECTION N/A 07/16/2022   Procedure: ENDOSCOPIC MUCOSAL RESECTION;  Surgeon: [name], MD;  Location: [facility] ENDOSCOPY;  Service: Gastroenterology;  Laterality: N/A;   ENDOSCOPIC MUCOSAL RESECTION  02/22/2023   Procedure: ENDOSCOPIC MUCOSAL RESECTION;  Surgeon: [name], MD;  Location: [facility] ENDOSCOPY;  Service: Gastroenterology;;   ESOPHAGOGASTRODUODENOSCOPY N/A 02/22/2023   Procedure: ESOPHAGOGASTRODUODENOSCOPY (EGD);  Surgeon: [name], MD;  Location: [facility];  Service: Gastroenterology;  Laterality: N/A;   ESOPHAGOGASTRODUODENOSCOPY (EGD) WITH PROPOFOL  N/A 04/28/2019   Procedure: ESOPHAGOGASTRODUODENOSCOPY (EGD) WITH PROPOFOL ;  Surgeon: [name], MD;  [facility];  Service: Endoscopy;  Laterality: N/A;  7:30   ESOPHAGOGASTRODUODENOSCOPY (EGD) WITH PROPOFOL  N/A 02/28/2020   Procedure: ESOPHAGOGASTRODUODENOSCOPY (EGD) WITH PROPOFOL ;  Surgeon: [name], MD;  [facility];  Service: Endoscopy;  Laterality: N/A;  1245   ESOPHAGOGASTRODUODENOSCOPY (EGD) WITH PROPOFOL  N/A 04/16/2022   Procedure: ESOPHAGOGASTRODUODENOSCOPY (EGD) WITH PROPOFOL ;  Surgeon: Austin Woods Angelia Sieving, MD;  [facility];  Service: Gastroenterology;  Laterality: N/A;  730am, asa 1-2   ESOPHAGOGASTRODUODENOSCOPY (EGD) WITH PROPOFOL  N/A 07/16/2022   Procedure: ESOPHAGOGASTRODUODENOSCOPY (EGD) WITH PROPOFOL ;  Surgeon: [name], MD;  Location: [facility] ENDOSCOPY;  Service: Gastroenterology;  Laterality: N/A;   EUS N/A  07/16/2022   Procedure: UPPER ENDOSCOPIC ULTRASOUND (EUS) RADIAL;  Surgeon: [name], MD;  Location: [facility] ENDOSCOPY;  Service: Gastroenterology;  Laterality: N/A;   EUS N/A 02/22/2023   Procedure: UPPER ENDOSCOPIC ULTRASOUND (EUS) RADIAL;  Surgeon: [name], MD;  Location: [facility] ENDOS[facility];  Service: Gastroenterology;  Laterality: N/A;   HEMOSTASIS CLIP PLACEMENT  07/16/2022   Procedure: HEMOSTASIS CLIP PLACEMENT;  Surgeon: [name], MD;  Location: [facility];  Service: Gastroenterology;;   HEMOSTASIS CLIP PLACEMENT  02/22/2023   Procedure: HEMOSTASIS CLIP PLACEMENT;  Surgeon: [name], MD;  Location: [facility] ENDOS[facility];  Service: Gastroenterology;;   HERNIA REPAIR     10 years   implantable loop recorder removal  03/01/2019   MDT Reveal LINQ removed in office   INGUINAL HERNIA REPAIR Bilateral 06/04/2017   Procedure: OPEN BILATERAL INGUINAL HERNIA REPAIR;  Surgeon: Kimble Lynwood, MD;  Location: [facility] OR;  Service: General;  Laterality: Bilateral;   LOOP RECORDER IMPLANT N/A 06/29/2014   Procedure: LOOP RECORDER IMPLANT;  Surgeon: [name], MD;  Location: [facility] CATH LAB;  Service: Cardiovascular;  Laterality: N/A;   NOSE SURGERY     sleep apnea surgery   PERIPHERAL VASCULAR CATHETERIZATION N/A 03/13/2016   Procedure: Thrombin  Injection;  Surgeon: Gaile LELON New, MD;  Location: [facility] INVASIVE CV LAB;  Service: Cardiovascular;  Laterality: N/A;   POLYPECTOMY  04/28/2019   Procedure: POLYPECTOMY;  Surgeon: [name], MD;  [facility]: [facility];  Service: Endoscopy;;  duodenum;splenic flexure;   SPINE SURGERY     10 years agao   STOMACH SURGERY     reflux, fundoplication   SUBMUCOSAL LIFTING INJECTION  07/16/2022   Procedure: SUBMUCOSAL LIFTING INJECTION;  Surgeon: [name], MD;  Location: [facility];  Service: Gastroenterology;;   SUBMUCOSAL LIFTING INJECTION  02/22/2023   Procedure: SUBMUCOSAL LIFTING INJECTION;   Surgeon: [name], MD;  Location: [facility];  Service: Gastroenterology;;   TEE WITHOUT CARDIOVERSION N/A 08/25/2012   Procedure: TRANSESOPHAGEAL  ECHOCARDIOGRAM (TEE);  Surgeon: [name], MD;  Location: [facility] ENDOSCOPY;  Service: Cardiovascular;  Laterality: N/A;   TEE WITHOUT CARDIOVERSION  03/23/2013   DR ROSS   TEE WITHOUT CARDIOVERSION N/A 03/23/2013   Procedure: TRANSESOPHAGEAL ECHOCARDIOGRAM (TEE);  Surgeon: Vina LULLA Gull, MD;  Location: [facility] ENDOSCOPY;  Service: Cardiovascular;  Laterality: N/A;   TEE WITHOUT CARDIOVERSION N/A 03/26/2014   Procedure: TRANSESOPHAGEAL ECHOCARDIOGRAM (TEE);  Surgeon: Maude JAYSON Emmer, MD;  Location: [facility] ENDOSCOPY;  Service: Cardiovascular;  Laterality: N/A;   TONSILLECTOMY      Current Outpatient Medications  Medication Sig Dispense Refill   albuterol  (VENTOLIN  HFA) 108 (90 Base) MCG/ACT inhaler Inhale 2 puffs into the lungs every 6 (six) hours as needed for wheezing or shortness of breath. 8 g 0   amLODipine (NORVASC) 5 MG tablet Take 5 mg by mouth daily.     atorvastatin  (LIPITOR) 40 MG tablet Take 1 tablet (40 mg total) by mouth daily. 90 tablet 3   azelastine  (ASTELIN ) 0.1 % nasal spray Place 1 spray into both nostrils 2 (two) times daily as needed for rhinitis or allergies.     benzonatate  (TESSALON ) 100 MG capsule Take 100 mg by mouth.     blood glucose meter kit and supplies Dispense based on patient and insurance preference. Use to check blood sugar once a day(FOR ICD-10 E10.9, E11.9). 1 each 0   Blood Glucose Monitoring Suppl (CONTOUR NEXT MONITOR) w/Device KIT as directed per package instructions twice a day     budesonide-formoterol  (SYMBICORT) 160-4.5 MCG/ACT inhaler 1 puff as needed Inhalation every 4 hrs     cetirizine (ZYRTEC) 10 MG tablet Take 10 mg by mouth daily.     Cholecalciferol  (VITAMIN D ) 2000 UNITS CAPS Take 4,000 Units by mouth daily.      dexlansoprazole  (DEXILANT ) 60 MG capsule Take 1 capsule (60 mg total) by  mouth daily. 90 capsule 1   dofetilide  (TIKOSYN ) 250 MCG capsule Take 1 capsule (250 mcg total) by mouth 2 (two) times daily. 60 capsule 6   enalapril  (VASOTEC ) 10 MG tablet Take 1 tablet (10 mg total) by mouth 2 (two) times daily. 180 tablet 3   fluticasone  (FLONASE) 50 MCG/ACT nasal spray Place 1 spray into both nostrils daily as needed for allergies.      Fluticasone -Salmeterol (ADVAIR DISKUS) 250-50 MCG/DOSE AEPB Inhale 1 puff into the lungs every 12 (twelve) hours. 60 each 0   furosemide  (LASIX ) 40 MG tablet Take 1 tablet (40 mg total) by mouth as needed.     glipiZIDE (GLUCOTROL XL) 5 MG 24 hr tablet Take 5 mg by mouth daily.     glucose blood (CONTOUR NEXT TEST) test strip as directed per package instructions In Vitro twice a day     hydrOXYzine  (VISTARIL ) 25 MG capsule Take 2 capsules (50 mg total) by mouth 3 (three) times daily as needed for anxiety. 30 capsule 0   loratadine (CLARITIN) 10 MG tablet Take 10 mg by mouth daily as needed for allergies.      Magnesium  Oxide 250 MG TABS 1 tablet Orally Four times a day     meloxicam  (MOBIC ) 15 MG tablet Take 15 mg by mouth daily as needed for pain.     metFORMIN  (GLUCOPHAGE ) 1000 MG tablet TAKE 1 TABLET BY MOUTH TWICE  DAILY WITH MEALS 30 tablet 0   metoprolol  tartrate (LOPRESSOR ) 50 MG tablet Take 50 mg by mouth daily.     MOUNJARO 2.5 MG/0.5ML Pen Inject 2.5 mg into the  skin once a week.     nitroGLYCERIN  (NITROSTAT ) 0.4 MG SL tablet DISSOLVE 1 TABLET UNDER THE TONGUE EVERY 5 MINUTES AS  NEEDED FOR CHEST PAIN. MAX  OF 3 TABLETS IN 15 MINUTES. CALL 911 IF PAIN PERSISTS. 25 tablet 1   ondansetron  (ZOFRAN ) 4 MG tablet TAKE 1 TABLET BY MOUTH EVERY 8 HOURS AS NEEDED FOR NAUSEA AND VOMITING 30 tablet 0   oxybutynin  (DITROPAN ) 5 MG tablet TAKE ONE-HALF TABLET (2.5mg ) BY  MOUTH TWO TIMES DAILY AS  NEEDED FOR BLADDER SPASMS 30 tablet 5   rivaroxaban  (XARELTO ) 20 MG TABS tablet TAKE 1 TABLET BY MOUTH DAILY WITH SUPPER 30 tablet 6   Simethicone  80 MG  TABS Take 1 tablet (80 mg total) by mouth every 6 (six) hours as needed. 60 tablet 1   vitamin B-12 (CYANOCOBALAMIN ) 500 MCG tablet Take 500 mcg by mouth daily.     No current facility-administered medications for this visit.    Allergies as of 11/22/2023   (No Known Allergies)    Social History   Socioeconomic History   Marital status: Married    Spouse name: Not on file   Number of children: 2   Years of education: Not on file   Highest education level: Not on file  Occupational History    Employer: [employer]  Tobacco Use   Smoking status: Former    Current packs/day: 0.00    Average packs/day: 1 pack/day for 7.2 years (7.2 ttl pk-yrs)    Types: Cigarettes    Start date: 05/19/2003    Quit date: 08/11/2012    Years since quitting: 11.2   Smokeless tobacco: Never   Tobacco comments:    Former smoker 10/20/23  Vaping Use   Vaping status: Never Used  Substance and Sexual Activity   Alcohol  use: No   Drug use: No   Sexual activity: Yes    Birth control/protection: None  Other Topics Concern   Not on file  Social History Narrative   Pt lives in [place] with wife.  Works at [employer]: Not on [employer] Insecurity: Not on file  Transportation Needs: Not on file  Physical Activity: Not on file  Stress: Not on file  Social Connections: Not on file    Review of systems General: negative for malaise, night sweats, fever, chills, weight loss Neck: Negative for lumps, goiter, pain and significant neck swelling Resp: Negative for cough, wheezing, dyspnea at rest CV: Negative for chest pain, leg swelling, palpitations, orthopnea GI: denies melena, hematochezia, nausea, vomiting, diarrhea, constipation, dysphagia, odyonophagia, early satiety or unintentional weight loss. +GERD symptoms  MSK: Negative for joint pain or swelling, back pain, and muscle pain. Derm: Negative for itching or rash Psych: Denies depression, anxiety,  memory loss, confusion. No homicidal or suicidal ideation.  Heme: Negative for prolonged bleeding, bruising easily, and swollen nodes. Endocrine: Negative for cold or heat intolerance, polyuria, polydipsia and goiter. Neuro: negative for tremor, gait imbalance, syncope and seizures. The remainder of the review of systems is noncontributory.  Physical Exam: BP 102/63   Pulse 79   Temp 97.9 F (36.6 C)   Ht 6' 1 (1.854 m)   Wt 227 lb 12.8 oz (103.3 kg)   BMI 30.05 kg/m  General:   Alert and oriented. No distress noted. Pleasant and cooperative.  Head:  Normocephalic and atraumatic. Eyes:  Conjuctiva clear without scleral icterus. Mouth:  Oral mucosa pink and  moist. Good dentition. No lesions. Heart: Normal rate and rhythm, s1 and s2 heart sounds present.  Lungs: Clear lung sounds in all lobes. Respirations equal and unlabored. Abdomen:  +BS, soft, non-tender and non-distended. No rebound or guarding. No HSM or masses noted. Derm: No palmar erythema or jaundice Msk:  Symmetrical without gross deformities. Normal posture. Extremities:  Without edema. Neurologic:  Alert and  oriented x4 Psych:  Alert and cooperative. Normal mood and affect.  Invalid input(s): 6 MONTHS   ASSESSMENT: Austin Woods is a 58 y.o. male presenting today for follow up of GERD and history of gastric carcinoid tumor  GERD: history of Nissen fundoplication 20+ years ago, relatively recent upper endoscopy with nonintact wrap noted. He continues to have reflux symptoms on dexilant  though maybe slightly improved. He denies dysphagia or odynophagia. Did not do well on voquezna . He was referred back to [facility] after last visit to have evaluation for possible redo of hi NF but states he has never heard anything from them. We will continue on dexilant  60mg  daily for now and check on referral to [facility].   Gastric carcinoid tumor:  currently following with oncology with recent labs with gastrin elevated, though  thought possibly secondary to use of dexilant , PET scan with  A focus of radiotracer activity in the distal gastric body that was indeterminate, No evidence of metastatic adenopathy in the upper abdomen, No evidence of liver metastasis, No distant metastatic disease. Chromagranin A remains elevated but stable at 1441 (last 1414), oncology recommended repeat labs and imaging in 6 months.  Last EUS done in November without findings of ongoing malignancy.   Was recommended to likely need repeat EUS around November 2025. We will touch base with [facility] regarding recall for EUS/EGD to ensure they have him on their list, should continue to follow with oncology for serial PET/tumor markers at their recommendation.    PLAN:  -check on [facility] referral -ensure on recall for repeat EGD/EUS in November -continue dexilant  60mg  daily  -continue to follow with oncology   All questions were answered, patient verbalized understanding and is in agreement with plan as outlined above.    Follow Up: 4 months   Zayn Selley L. Mariette, MSN, APRN, AGNP-C Adult-Gerontology Nurse Practitioner [facility]  I have reviewed the note and agree with the APP's assessment as described in this progress note  Discussed case with Dr. Wilhelmenia.  He reviewed the images from most recent PET scan and recommended proceeding with esophagogastroduodenospy prior to scheduling endoscopic ultrasound.  I spoke with the patient and he is in agreement to proceed with esophagogastroduodenospy, which will be scheduled.  Toribio Fortune, MD Gastroenterology and Hepatology [facility] Gastroenterology

## 2023-11-22 NOTE — Patient Instructions (Signed)
 We will follow up on referral to [facility] regarding redo of your nissen fundoplication We will also touch base with Dr. Wilhelmenia to ensure you are on their recall list for repeat EGD/EUS in November Continue to follow with oncology Continue dexilant  60mg  daily  Follow up 4 months  It was a pleasure to see you today. I want to create trusting relationships with patients and provide genuine, compassionate, and quality care. I truly value your feedback! please be on the lookout for a survey regarding your visit with me today. I appreciate your input about our visit and your time in completing this!    Nicola Quesnell L. Thatiana Renbarger, MSN, APRN, AGNP-C Adult-Gerontology Nurse Practitioner [facility] at [facility]

## 2023-11-23 ENCOUNTER — Telehealth: Payer: Self-pay | Admitting: *Deleted

## 2023-11-23 NOTE — Telephone Encounter (Signed)
-----   Message from [facility] sent at 11/23/2023  5:12 PM EDT ----- Team, I reviewed the dotatate scan from June 2025 ordered by [place]. No evidence of any abnormality on that. I recommend doing an endoscopy this year before we do EUS. Based on those results, could consider EUS next year. Let me know what the endoscopy shows if something else is found or concerning. Thanks. GM ----- Message ----- From: Claudene Naomie SAILOR, RN Sent: 11/23/2023   1:06 PM EDT To: Aloha Wilhelmenia Raddle., MD  Dr Wilhelmenia-  Can you review and see if appropriate for patient to have repeat EUS 2025 as requested by [facility]? I do not currently see a recall in the system and your 2024 note indicates you are unsure that patient needs repeat EUS for every re-evaluation.  Thanks ----- Message ----- From: Blanca Jenkins DEL Sent: 11/22/2023  11:25 AM EDT To: Odetta LITTIE Curly, RN  Walterine Odetta, our NP [facility] wanted to reach out to you guys to make sure you have this patient on recall for repeat EUS in November 2025 or do I need to send you a message closer to that time. Thanks, Jenkins

## 2023-11-24 ENCOUNTER — Telehealth: Payer: Self-pay | Admitting: *Deleted

## 2023-11-24 DIAGNOSIS — Z01818 Encounter for other preprocedural examination: Secondary | ICD-10-CM

## 2023-11-24 NOTE — Telephone Encounter (Signed)
 Pt has been scheduled tele preop appt 12/03/23, labs to be done 11/26/23 BMET/CBC.   Med rec and consent are done.

## 2023-11-24 NOTE — Telephone Encounter (Signed)
 Patient needs CBC and BMET for pharmacy recommendations concerning Xarelto . Then need to have APP weigh in as he was seen last in July of 2025.

## 2023-11-24 NOTE — Telephone Encounter (Signed)
 Pt has been scheduled tele preop appt 12/03/23, labs to be done 11/26/23 BMET/CBC.   Med rec and consent are done.     Patient Consent for Virtual Visit        Austin Woods has provided verbal consent on 11/24/2023 for a virtual visit (video or telephone).   CONSENT FOR VIRTUAL VISIT FOR:  Austin Woods  By participating in this virtual visit I agree to the following:  I hereby voluntarily request, consent and authorize [facility] and its employed or contracted physicians, physician assistants, nurse practitioners or other licensed health care professionals (the Practitioner), to provide me with telemedicine health care services (the "Services) as deemed necessary by the treating Practitioner. I acknowledge and consent to receive the Services by the Practitioner via telemedicine. I understand that the telemedicine visit will involve communicating with the Practitioner through live audiovisual communication technology and the disclosure of certain medical information by electronic transmission. I acknowledge that I have been given the opportunity to request an in-person assessment or other available alternative prior to the telemedicine visit and am voluntarily participating in the telemedicine visit.  I understand that I have the right to withhold or withdraw my consent to the use of telemedicine in the course of my care at any time, without affecting my right to future care or treatment, and that the Practitioner or I may terminate the telemedicine visit at any time. I understand that I have the right to inspect all information obtained and/or recorded in the course of the telemedicine visit and may receive copies of available information for a reasonable fee.  I understand that some of the potential risks of receiving the Services via telemedicine include:  Delay or interruption in medical evaluation due to technological equipment failure or disruption; Information transmitted may  not be sufficient (e.g. poor resolution of images) to allow for appropriate medical decision making by the Practitioner; and/or  In rare instances, security protocols could fail, causing a breach of personal health information.  Furthermore, I acknowledge that it is my responsibility to provide information about my medical history, conditions and care that is complete and accurate to the best of my ability. I acknowledge that Practitioner's advice, recommendations, and/or decision may be based on factors not within their control, such as incomplete or inaccurate data provided by me or distortions of diagnostic images or specimens that may result from electronic transmissions. I understand that the practice of medicine is not an exact science and that Practitioner makes no warranties or guarantees regarding treatment outcomes. I acknowledge that a copy of this consent can be made available to me via my patient portal [facility] MyChart), or I can request a printed copy by calling the office of [facility].    I understand that my insurance will be billed for this visit.   I have read or had this consent read to me. I understand the contents of this consent, which adequately explains the benefits and risks of the Services being provided via telemedicine.  I have been provided ample opportunity to ask questions regarding this consent and the Services and have had my questions answered to my satisfaction. I give my informed consent for the services to be provided through the use of telemedicine in my medical care

## 2023-11-24 NOTE — Telephone Encounter (Signed)
   Name: Austin Woods  DOB: [birth year]  MRN: [id number]  Primary Cardiologist: Diannah SHAUNNA Maywood, MD   Preoperative team, please contact this patient and set up a phone call appointment for further preoperative risk assessment. Please obtain consent and complete medication review. Thank you for your help.  I confirm that guidance regarding antiplatelet and oral anticoagulation therapy has been completed and, if necessary, noted below.  Pharmacy still needs to weigh in after labs are completed concerning Xarelto  hold.  I also confirmed the patient resides in the state of [place] . As per [facility] Medical Board telemedicine laws, the patient must reside in the state in which the provider is licensed.   Lamarr Satterfield, NP 11/24/2023, 4:41 PM [facility]

## 2023-11-24 NOTE — Addendum Note (Signed)
 Addended by: Talishia Betzler M on: 11/24/2023 05:05 PM   Modules accepted: Orders

## 2023-11-24 NOTE — Telephone Encounter (Signed)
  Request for patient to stop medication prior to procedure or is needing cleareance  11/24/23  Austin Woods 17-Mar-1966  What type of surgery is being performed? esophagogastroduodenospy   When is surgery scheduled? TBD  What type of clearance is required (medical or pharmacy to hold medication or both? medication  Are there any medications that need to be held prior to surgery and how long? Xarelto  x 2 days  Name of physician performing surgery?  [facility] at [facility]: [phone], option 5 Fax: [phone]  Anesthesia type (none, local, MAC, general)? MAC   ? Yes ? No Patient can hold medication as requested   Signature: ___________________________

## 2023-11-24 NOTE — Telephone Encounter (Signed)
 Clearance sent   Message Received: Today [name], MD  Mansouraty, Aloha Raddle., MD; Mariette Mitzie CROME, NP; Jeanell Graeme RAMAN, CMA; Gaylene Madelin CROME, LPN Thanks Aloha, appreciate your input.  I spoke with the patient today and he is in agreement to proceed first with esophagogastroduodenospy. Mindy/Etter Royall, can you please schedule an esophagogastroduodenospy? Dx: Gastric carcinoid follow-up. Room: 3.  Needs clearance to hold Xarelto .  Mounjaro per protocol.  Thanks,  Sieving Eartha, MD Gastroenterology and Hepatology [facility]       Previous Messages    ----- Message ----- From: [name], MD Sent: 11/23/2023   5:13 PM EDT To: Naomie LOISE Sharps, RN; Jenkins VEAR [place]; Chelsea L*  Team, I reviewed the dotatate scan from June 2025 ordered by [place]. No evidence of any abnormality on that. I recommend doing an endoscopy this year before we do EUS. Based on those results, could consider EUS next year. Let me know what the endoscopy shows if something else is found or concerning. Thanks. GM ----- Message ----- From: Sharps Naomie LOISE, RN Sent: 11/23/2023   1:06 PM EDT To: Aloha Wilhelmenia Raddle., MD  Dr Wilhelmenia-  Can you review and see if appropriate for patient to have repeat EUS 2025 as requested by  GI? I do not currently see a recall in the system and your 2024 note indicates you are unsure that patient needs repeat EUS for every re-evaluation.  Thanks ----- Message ----- From: [place] Jenkins VEAR Sent: 11/22/2023  11:25 AM EDT To: Odetta CROME Curly, RN  Walterine Odetta, our NP [facility] wanted to reach out to you guys to make sure you have this patient on recall for repeat EUS in November 2025 or do I need to send you a message closer to that time. Thanks, Jenkins

## 2023-11-26 ENCOUNTER — Other Ambulatory Visit (INDEPENDENT_AMBULATORY_CARE_PROVIDER_SITE_OTHER): Payer: Self-pay | Admitting: Gastroenterology

## 2023-11-26 LAB — GENECONNECT MOLECULAR SCREEN: Genetic Analysis Overall Interpretation: NEGATIVE

## 2023-11-26 NOTE — Telephone Encounter (Signed)
 Pt has been scheduled tele preop appt 12/03/23

## 2023-11-27 LAB — BASIC METABOLIC PANEL WITH GFR
BUN/Creatinine Ratio: 19 (ref 10–24)
BUN: 21 mg/dL (ref 8–27)
CO2: 19 mmol/L — ABNORMAL LOW (ref 20–29)
Calcium: 9 mg/dL (ref 8.6–10.2)
Chloride: 106 mmol/L (ref 96–106)
Creatinine, Ser: 1.1 mg/dL (ref 0.76–1.27)
Glucose: 246 mg/dL — ABNORMAL HIGH (ref 70–99)
Potassium: 4.7 mmol/L (ref 3.5–5.2)
Sodium: 143 mmol/L (ref 134–144)
eGFR: 77 mL/min/1.73 (ref >59)

## 2023-11-27 LAB — MAGNESIUM: Magnesium: 1.6 mg/dL (ref 1.6–2.3)

## 2023-11-29 NOTE — Telephone Encounter (Signed)
 Patient with diagnosis of afib on Xarelto  for anticoagulation.    Procedure: esophagogastroduodenospy  Date of procedure: TBD   CHA2DS2-VASc Score = 4   This indicates a 4.8% annual risk of stroke. The patient's score is based upon: CHF History: 1 HTN History: 1 Diabetes History: 1 Stroke History: 0 Vascular Disease History: 1 Age Score: 0 Gender Score: 0      CrCl 90 ml/min   Platelet count : Will not change clinical recommendation  Patient has not had an Afib/aflutter ablation within the last 3 months or DCCV within the last 30 days  Per office protocol, patient can hold Xarelto  for 2 days prior to procedure.    **This guidance is not considered finalized until pre-operative APP has relayed final recommendations.**

## 2023-12-03 ENCOUNTER — Ambulatory Visit: Attending: Cardiology

## 2023-12-03 DIAGNOSIS — Z01810 Encounter for preprocedural cardiovascular examination: Secondary | ICD-10-CM

## 2023-12-03 NOTE — Progress Notes (Signed)
 Virtual Visit via Telephone Note   Because of Austin Woods co-morbid illnesses, he is at least at moderate risk for complications without adequate follow up.  This format is felt to be most appropriate for this patient at this time.  Due to technical limitations with video connection (technology), today's appointment will be conducted as an audio only telehealth visit, and OCTAVIUS SHIN verbally agreed to proceed in this manner.   All issues noted in this document were discussed and addressed.  No physical exam could be performed with this format.  Evaluation Performed:  Preoperative cardiovascular risk assessment _____________   Date:  12/03/2023   Patient ID:  Austin Woods, DOB September 06, 1965, MRN [id number] Patient Location:  Home Provider location:   Office  Primary Care Provider:  Vida Mardy DEL, PA-C Primary Cardiologist:  Diannah SHAUNNA Maywood, MD  Chief Complaint / Patient Profile   58 y.o. 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  History of Present Illness    Austin Woods is a 58 y.o. male who presents via audio/video conferencing for a telehealth visit today.  Pt was last seen in cardiology clinic on 09/10/2023 by [name], PA. At that time [name] was doing well since previous office visit and was taking Lasix  as needed.  He was noted to also have well-controlled blood pressure.  The patient is now pending procedure as outlined above. Since his last visit, he has been doing well with no new cardiac complaints.  He denies chest pain, shortness of breath, lower extremity edema, fatigue, palpitations, melena, hematuria, hemoptysis, diaphoresis, weakness,  presyncope, syncope, orthopnea, and PND.    Past Medical History    Past Medical History:  Diagnosis Date   A-fib [facility])    Allergy    All my life   Anemia    iron  def anemia   Arthritis    Asthma    Coronary artery disease    Dysrhythmia    in and out Afib   GERD (gastroesophageal reflux disease)    Headache    High cholesterol    History of DVT (deep vein thrombosis)    a. left leg following ablation for SVT   Hypertension    Persistent atrial fibrillation (HCC)    a. Dx 08/2012, xarelto  initiated; b. 09/2012 Tikosyn  initiated -> DCCV -> Sinus c. PVI 03/2013 d. 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   Type 2 diabetes mellitus without complication, without long-term current use of insulin (HCC) 02/16/2018   Wolff-Parkinson-White (WPW) syndrome    resolved s/p RFCA 2001 by Dr Fernande   Past Surgical History:  Procedure Laterality Date   ATRIAL FIBRILLATION ABLATION  03/23/2013   PVI by DR KELSIE for afib   ATRIAL FIBRILLATION ABLATION N/A 03/23/2013   Procedure: ATRIAL FIBRILLATION ABLATION;  Surgeon: Austin JONETTA KELSIE, MD;  [facility]: [facility] C[facility];  Service: Cardiovascular;  Laterality: N/A;   ATRIAL FIBRILLATION ABLATION N/A 03/27/2014   PVI Dr KELSIE SANES SURGERY     BIOPSY  02/28/2020   Procedure: BIOPSY;  Surgeon: [name], MD;  [facility]: [facility];  Service: Endoscopy;;  antral(pre-pyloric)    BIOPSY  04/16/2022   Procedure: BIOPSY;  Surgeon: [name], MD;  [facility];  Service: Gastroenterology;;   BIOPSY  07/16/2022   Procedure: BIOPSY;  Surgeon: [name], MD;  Location: [facility];  Service: Gastroenterology;;   BIOPSY  02/22/2023   Procedure: BIOPSY;  Surgeon: [name], MD;  Location: [facility];  Service: Gastroenterology;;   CARDIAC CATHETERIZATION N/A 02/03/2016   Procedure: Left Heart Cath and Coronary Angiography;  Surgeon: [name], MD;   Location: [facility] INVASIVE CV LAB;  Service: Cardiovascular;  Laterality: N/A;   CARDIAC CATHETERIZATION N/A 02/03/2016   Procedure: Intravascular Pressure Wire/FFR Study;  Surgeon: [name], MD;  Location: [facility] INVASIVE CV LAB;  Service: Cardiovascular;  Laterality: N/A;   CARDIAC CATHETERIZATION N/A 02/03/2016   Procedure: Coronary Stent Intervention;  Surgeon: [name], MD;  Location: [facility] INVASIVE CV LAB;  Service: Cardiovascular;  Laterality: N/A;   CARDIAC SURGERY     CARDIOVERSION N/A 08/25/2012   Procedure: CARDIOVERSION;  Surgeon: [name], MD;  Location: [facility] ENDOSCOPY;  Service: Cardiovascular;  Laterality: N/A;   CARDIOVERSION N/A 09/06/2012   Procedure: CARDIOVERSION/Bedside;  Surgeon: [name], MD;  Location: [facility] OR;  Service: Cardiovascular;  Laterality: N/A;   CARDIOVERSION N/A 01/12/2014   Procedure: CARDIOVERSION;  Surgeon: [name], MD;  Location: [facility] ENDOSCOPY;  Service: Cardiovascular;  Laterality: N/A;   CARDIOVERSION N/A 01/30/2016   Procedure: CARDIOVERSION;  Surgeon: [name], MD;  Location: [facility] ENDOSCOPY;  Service: Cardiovascular;  Laterality: N/A;   CARDIOVERSION N/A 04/14/2016   Procedure: CARDIOVERSION;  Surgeon: [name], MD;  Location: [facility] ENDOSCOPY;  Service: Cardiovascular;  Laterality: N/A;   COLONOSCOPY N/A 12/04/2015   Procedure: COLONOSCOPY;  Surgeon: Claudis RAYMOND Rivet, MD;  [facility];  Service: Endoscopy;  Laterality: N/A;  730   COLONOSCOPY WITH PROPOFOL  N/A 04/28/2019   Procedure: COLONOSCOPY WITH PROPOFOL ;  Surgeon: [name], MD;  [facility];  Service: Endoscopy;  Laterality: N/A;   CORONARY ANGIOPLASTY     CORONARY ANGIOPLASTY WITH STENT PLACEMENT     ELECTROPHYSIOLOGIC STUDY N/A 02/18/2016   Procedure: Atrial Fibrillation Ablation;  Surgeon: Austin Rakers, MD;  Location: [facility] INVASIVE CV LAB;  Service: Cardiovascular;  Laterality: N/A;   ENDOSCOPIC MUCOSAL RESECTION N/A 07/16/2022    Procedure: ENDOSCOPIC MUCOSAL RESECTION;  Surgeon: [name], MD;  Location: [facility] ENDOSCOPY;  Service: Gastroenterology;  Laterality: N/A;   ENDOSCOPIC MUCOSAL RESECTION  02/22/2023   Procedure: ENDOSCOPIC MUCOSAL RESECTION;  Surgeon: [name], MD;  Location: [facility] ENDOSCOPY;  Service: Gastroenterology;;   ESOPHAGOGASTRODUODENOSCOPY N/A 02/22/2023   Procedure: ESOPHAGOGASTRODUODENOSCOPY (EGD);  Surgeon: [name], MD;  Location: [facility];  Service: Gastroenterology;  Laterality: N/A;   ESOPHAGOGASTRODUODENOSCOPY (EGD) WITH PROPOFOL  N/A 04/28/2019   Procedure: ESOPHAGOGASTRODUODENOSCOPY (EGD) WITH PROPOFOL ;  Surgeon: [name], MD;  [facility];  Service: Endoscopy;  Laterality: N/A;  7:30   ESOPHAGOGASTRODUODENOSCOPY (EGD) WITH PROPOFOL  N/A 02/28/2020   Procedure: ESOPHAGOGASTRODUODENOSCOPY (EGD) WITH PROPOFOL ;  Surgeon: [name], MD;  [facility];  Service: Endoscopy;  Laterality: N/A;  1245   ESOPHAGOGASTRODUODENOSCOPY (EGD) WITH PROPOFOL  N/A 04/16/2022   Procedure: ESOPHAGOGASTRODUODENOSCOPY (EGD) WITH PROPOFOL ;  Surgeon: [name], MD;  [facility];  Service: Gastroenterology;  Laterality: N/A;  730am, asa 1-2   ESOPHAGOGASTRODUODENOSCOPY (EGD) WITH PROPOFOL  N/A 07/16/2022   Procedure: ESOPHAGOGASTRODUODENOSCOPY (EGD) WITH PROPOFOL ;  Surgeon:  Mansouraty, Aloha Raddle., MD;  Location: [facility];  Service: Gastroenterology;  Laterality: N/A;   EUS N/A 07/16/2022   Procedure: UPPER ENDOSCOPIC ULTRASOUND (EUS) RADIAL;  Surgeon: [name], MD;  Location: [facility] ENDOS[facility];  Service: Gastroenterology;  Laterality: N/A;   EUS N/A 02/22/2023   Procedure: UPPER ENDOSCOPIC ULTRASOUND (EUS) RADIAL;  Surgeon: [name], MD;  Location: [facility]OS[facility];  Service: Gastroenterology;  Laterality: N/A;   HEMOSTASIS CLIP PLACEMENT  07/16/2022   Procedure: HEMOSTASIS CLIP PLACEMENT;   Surgeon: [name], MD;  Location: [facility];  Service: Gastroenterology;;   HEMOSTASIS CLIP PLACEMENT  02/22/2023   Procedure: HEMOSTASIS CLIP PLACEMENT;  Surgeon: [name], MD;  Location: [facility];  Service: Gastroenterology;;   HERNIA REPAIR     10 years   implantable loop recorder removal  03/01/2019   MDT Reveal LINQ removed in office   INGUINAL HERNIA REPAIR Bilateral 06/04/2017   Procedure: OPEN BILATERAL INGUINAL HERNIA REPAIR;  Surgeon: Kimble Agent, MD;  Location: [facility] OR;  Service: General;  Laterality: Bilateral;   LOOP RECORDER IMPLANT N/A 06/29/2014   Procedure: LOOP RECORDER IMPLANT;  Surgeon: Agent Rakers, MD;  Location: [facility] CATH LAB;  Service: Cardiovascular;  Laterality: N/A;   NOSE SURGERY     sleep apnea surgery   PERIPHERAL VASCULAR CATHETERIZATION N/A 03/13/2016   Procedure: Thrombin  Injection;  Surgeon: Gaile LELON New, MD;  Location: [facility] INVASIVE CV LAB;  Service: Cardiovascular;  Laterality: N/A;   POLYPECTOMY  04/28/2019   Procedure: POLYPECTOMY;  Surgeon: [name], MD;  [facility];  Service: Endoscopy;;  duodenum;splenic flexure;   SPINE SURGERY     10 years agao   STOMACH SURGERY     reflux, fundoplication   SUBMUCOSAL LIFTING INJECTION  07/16/2022   Procedure: SUBMUCOSAL LIFTING INJECTION;  Surgeon: [name], MD;  Location: [facility];  Service: Gastroenterology;;   SUBMUCOSAL LIFTING INJECTION  02/22/2023   Procedure: SUBMUCOSAL LIFTING INJECTION;  Surgeon: [name], MD;  Location: [facility];  Service: Gastroenterology;;   TEE WITHOUT CARDIOVERSION N/A 08/25/2012   Procedure: TRANSESOPHAGEAL ECHOCARDIOGRAM (TEE);  Surgeon: [name], MD;  Location: [facility] ENDOSCOPY;  Service: Cardiovascular;  Laterality: N/A;   TEE WITHOUT CARDIOVERSION  03/23/2013   DR ROSS   TEE WITHOUT CARDIOVERSION N/A 03/23/2013   Procedure: TRANSESOPHAGEAL ECHOCARDIOGRAM (TEE);  Surgeon: Vina LULLA Gull, MD;  Location: [facility] ENDOSCOPY;  Service: Cardiovascular;  Laterality: N/A;   TEE WITHOUT CARDIOVERSION N/A 03/26/2014   Procedure: TRANSESOPHAGEAL ECHOCARDIOGRAM (TEE);  Surgeon: Maude JAYSON Emmer, MD;  Location: [facility] ENDOSCOPY;  Service: Cardiovascular;  Laterality: N/A;   TONSILLECTOMY      Allergies  No Known Allergies  Home Medications    Prior to Admission medications   Medication Sig Start Date End Date Taking? Authorizing Provider  albuterol  (VENTOLIN  HFA) 108 (90 Base) MCG/ACT inhaler Inhale 2 puffs into the lungs every 6 (six) hours as needed for wheezing or shortness of breath. 07/21/19   Alvia Corean CROME, FNP  amLODipine (NORVASC) 5 MG tablet Take 5 mg by mouth daily.    [provider]  atorvastatin  (LIPITOR) 40 MG tablet Take 1 tablet (40 mg total) by mouth daily. 04/24/22 11/24/23  Mallipeddi, Vishnu P, MD  azelastine  (ASTELIN ) 0.1 % nasal spray Place 1 spray into both nostrils 2 (two) times daily as needed for rhinitis or allergies.    [provider]  benzonatate  (TESSALON ) 100 MG capsule Take 100 mg by mouth.  [provider]  blood glucose meter kit and supplies Dispense based on patient and insurance preference. Use to check blood sugar once a day(FOR ICD-10 E10.9, E11.9). 02/16/18   Alphonsa Elsie RAMAN, MD  Blood Glucose Monitoring Suppl (CONTOUR NEXT MONITOR) w/Device KIT as directed per package instructions twice a day 05/27/20   [provider]  budesonide-formoterol  (SYMBICORT) 160-4.5 MCG/ACT inhaler 1 puff as needed Inhalation every 4 hrs 09/29/22   [provider]  cetirizine (ZYRTEC) 10 MG tablet Take 10 mg by mouth daily. 01/07/23   [provider]  Cholecalciferol  (VITAMIN D ) 2000 UNITS CAPS Take 4,000 Units by mouth daily.     [provider]  dexlansoprazole  (DEXILANT ) 60 MG capsule Take 1 capsule (60 mg total) by mouth daily. 11/22/23   Carlan, Chelsea L, NP  dofetilide  (TIKOSYN ) 250 MCG capsule Take  1 capsule (250 mcg total) by mouth 2 (two) times daily. 09/16/23   Mealor, Augustus E, MD  enalapril  (VASOTEC ) 10 MG tablet Take 1 tablet (10 mg total) by mouth 2 (two) times daily. 05/29/16   Allred, Lynwood, MD  fluticasone  [place]) 50 MCG/ACT nasal spray Place 1 spray into both nostrils daily as needed for allergies.     [provider]  Fluticasone -Salmeterol (ADVAIR DISKUS) 250-50 MCG/DOSE AEPB Inhale 1 puff into the lungs every 12 (twelve) hours. 07/29/17   Alphonsa Elsie RAMAN, MD  furosemide  (LASIX ) 40 MG tablet Take 1 tablet (40 mg total) by mouth as needed. 06/11/21   Dow Arland BROCKS, NP  glipiZIDE (GLUCOTROL XL) 5 MG 24 hr tablet Take 5 mg by mouth daily. 06/10/23   [provider]  glucose blood (CONTOUR NEXT TEST) test strip as directed per package instructions In Vitro twice a day 05/27/20   [provider]  hydrOXYzine  (VISTARIL ) 25 MG capsule Take 2 capsules (50 mg total) by mouth 3 (three) times daily as needed for anxiety. 07/11/16   Antoinette Doe, MD  loratadine (CLARITIN) 10 MG tablet Take 10 mg by mouth daily as needed for allergies.     [provider]  Magnesium  Oxide 250 MG TABS 1 tablet Orally Four times a day 02/02/23   [provider]  meloxicam  (MOBIC ) 15 MG tablet Take 15 mg by mouth daily as needed for pain. 02/01/22   [provider]  metFORMIN  (GLUCOPHAGE ) 1000 MG tablet TAKE 1 TABLET BY MOUTH TWICE  DAILY WITH MEALS 05/29/21   Cook, Jayce G, DO  metoprolol  tartrate (LOPRESSOR ) 50 MG tablet Take 50 mg by mouth daily. 06/03/19   [provider]  MOUNJARO 2.5 MG/0.5ML Pen Inject 2.5 mg into the skin once a week. 10/14/23   [provider]  nitroGLYCERIN  (NITROSTAT ) 0.4 MG SL tablet DISSOLVE 1 TABLET UNDER THE TONGUE EVERY 5 MINUTES AS  NEEDED FOR CHEST PAIN. MAX  OF 3 TABLETS IN 15 MINUTES. CALL 911 IF PAIN PERSISTS. 05/30/20   Allred, Lynwood, MD  ondansetron  (ZOFRAN ) 4 MG tablet TAKE 1 TABLET BY MOUTH EVERY 8 HOURS  AS NEEDED FOR NAUSEA AND VOMITING 06/24/23   Carlan, Chelsea L, NP  oxybutynin  (DITROPAN ) 5 MG tablet TAKE ONE-HALF TABLET (2.5mg ) BY  MOUTH TWO TIMES DAILY AS  NEEDED FOR BLADDER SPASMS 07/29/17   Alphonsa Elsie RAMAN, MD  rivaroxaban  (XARELTO ) 20 MG TABS tablet TAKE 1 TABLET BY MOUTH DAILY WITH SUPPER 08/04/23   Mealor, Augustus E, MD  Simethicone  80 MG TABS Take 1 tablet (80 mg total) by mouth every 6 (six) hours as needed. 06/22/22  Carlan, Chelsea L, NP  vitamin B-12 (CYANOCOBALAMIN ) 500 MCG tablet Take 500 mcg by mouth daily.    [provider]    Physical Exam    Vital Signs:  AMARIE TARTE does not have vital signs available for review today.  Given telephonic nature of communication, physical exam is limited. AAOx3. NAD. Normal affect.  Speech and respirations are unlabored.  Accessory Clinical Findings    None  Assessment & Plan    1.  Preoperative Cardiovascular Risk Assessment: - Patient's RCRI score is 0.9% The patient affirms he has been doing well without any new cardiac symptoms. They are able to achieve 7 METS without cardiac limitations. Therefore, based on ACC/AHA guidelines, the patient would be at acceptable risk for the planned procedure without further cardiovascular testing. The patient was advised that if he develops new symptoms prior to surgery to contact our office to arrange for a follow-up visit, and he verbalized understanding.   The patient was advised that if he develops new symptoms prior to surgery to contact our office to arrange for a follow-up visit, and he verbalized understanding.  Per office protocol, patient can hold Xarelto  for 2 days prior to procedure.    A copy of this note will be routed to requesting surgeon.  Time:   Today, I have spent 6 minutes with the patient with telehealth technology discussing medical history, symptoms, and management plan.     Wyn Raddle, Jackee Shove, NP  12/03/2023, 7:13 AM

## 2023-12-07 NOTE — Telephone Encounter (Signed)
Ok to proceed with scheduling.  

## 2023-12-07 NOTE — Telephone Encounter (Signed)
 Pt had telephone visit on 12/03/23. Please advise. Thank you

## 2023-12-08 ENCOUNTER — Encounter: Payer: Self-pay | Admitting: *Deleted

## 2023-12-08 ENCOUNTER — Encounter (HOSPITAL_COMMUNITY)
Admission: RE | Admit: 2023-12-08 | Discharge: 2023-12-08 | Disposition: A | Discharge status: Home / Self Care | Source: Ambulatory Visit | Attending: Gastroenterology | Admitting: Gastroenterology

## 2023-12-08 ENCOUNTER — Encounter (HOSPITAL_COMMUNITY): Payer: Self-pay

## 2023-12-08 ENCOUNTER — Other Ambulatory Visit: Payer: Self-pay

## 2023-12-08 NOTE — Telephone Encounter (Signed)
 Pt states he has not taking Mounjaro since 11/28/23 and took his Xarelto  last night. Message sent to [facility] about scheduling pt for 12/09/23 and per Dr.Castaneda: Oh hold on last dose was yesterday, yeah we can do him tomorrow, please ask him to hold off on the xarelto  for now. Pt has been scheduled for 12/09/23, instructions sent via mychart.

## 2023-12-08 NOTE — Anesthesia Preprocedure Evaluation (Signed)
 Anesthesia Evaluation  Patient identified by MRN, date of birth, ID band Patient awake    Reviewed: Allergy & Precautions, NPO status , Patient's Chart, lab work & pertinent test results, reviewed documented beta blocker date and time   Airway Mallampati: II  TM Distance: >3 FB Neck ROM: Full    Dental no notable dental hx. (+) Teeth Intact, Dental Advisory Given   Pulmonary asthma , sleep apnea , former smoker   Pulmonary exam normal breath sounds clear to auscultation       Cardiovascular hypertension, Pt. on home beta blockers and Pt. on medications + angina  + CAD, +CHF and + DVT  Normal cardiovascular exam+ dysrhythmias Atrial Fibrillation  Rhythm:Regular Rate:Normal  WPW syndrome   Neuro/Psych  Headaches    GI/Hepatic Neg liver ROS,GERD  ,,gastric neuroendocrine tumor   Endo/Other  diabetes, Type 2, Oral Hypoglycemic Agents  Class 3 obesity  Renal/GU negative Renal ROS  negative genitourinary   Musculoskeletal  (+) Arthritis ,    Abdominal  (+) + obese  Peds  Hematology  (+) Blood dyscrasia (Xarelto )   Anesthesia Other Findings Day of surgery medications reviewed with the patient.  Reproductive/Obstetrics                              Anesthesia Physical Anesthesia Plan  ASA: 3  Anesthesia Plan: General   Post-op Pain Management: Minimal or no pain anticipated   Induction: Intravenous  PONV Risk Score and Plan: Propofol  infusion  Airway Management Planned: Natural Airway and Nasal Cannula  Additional Equipment: None  Intra-op Plan:   Post-operative Plan:   Informed Consent: I have reviewed the patients History and Physical, chart, labs and discussed the procedure including the risks, benefits and alternatives for the proposed anesthesia with the patient or authorized representative who has indicated his/her understanding and acceptance.     Dental advisory  given  Plan Discussed with: CRNA  Anesthesia Plan Comments:         Anesthesia Quick Evaluation

## 2023-12-08 NOTE — Telephone Encounter (Signed)
 LMOVM to return call.

## 2023-12-08 NOTE — Telephone Encounter (Signed)
 Pt is aware that a pre-op telephone call will be done today.

## 2023-12-09 ENCOUNTER — Ambulatory Visit (HOSPITAL_COMMUNITY): Payer: Self-pay | Admitting: Anesthesiology

## 2023-12-09 ENCOUNTER — Encounter (HOSPITAL_COMMUNITY)
Admission: RE | Disposition: A | Discharge status: Home / Self Care | Payer: Self-pay | Source: Home / Self Care | Attending: Gastroenterology

## 2023-12-09 ENCOUNTER — Encounter (HOSPITAL_COMMUNITY): Payer: Self-pay | Admitting: Gastroenterology

## 2023-12-09 ENCOUNTER — Other Ambulatory Visit: Payer: Self-pay

## 2023-12-09 ENCOUNTER — Ambulatory Visit (HOSPITAL_COMMUNITY)
Admission: RE | Admit: 2023-12-09 | Discharge: 2023-12-09 | Disposition: A | Discharge status: Home / Self Care | Attending: Gastroenterology | Admitting: Gastroenterology

## 2023-12-09 DIAGNOSIS — K3189 Other diseases of stomach and duodenum: Secondary | ICD-10-CM | POA: Diagnosis not present

## 2023-12-09 DIAGNOSIS — Z7985 Long-term (current) use of injectable non-insulin antidiabetic drugs: Secondary | ICD-10-CM | POA: Insufficient documentation

## 2023-12-09 DIAGNOSIS — K259 Gastric ulcer, unspecified as acute or chronic, without hemorrhage or perforation: Secondary | ICD-10-CM | POA: Diagnosis not present

## 2023-12-09 DIAGNOSIS — D759 Disease of blood and blood-forming organs, unspecified: Secondary | ICD-10-CM | POA: Diagnosis not present

## 2023-12-09 DIAGNOSIS — E669 Obesity, unspecified: Secondary | ICD-10-CM | POA: Insufficient documentation

## 2023-12-09 DIAGNOSIS — I10 Essential (primary) hypertension: Secondary | ICD-10-CM | POA: Diagnosis not present

## 2023-12-09 DIAGNOSIS — Z8679 Personal history of other diseases of the circulatory system: Secondary | ICD-10-CM | POA: Insufficient documentation

## 2023-12-09 DIAGNOSIS — Z7901 Long term (current) use of anticoagulants: Secondary | ICD-10-CM | POA: Insufficient documentation

## 2023-12-09 DIAGNOSIS — Z9889 Other specified postprocedural states: Secondary | ICD-10-CM | POA: Insufficient documentation

## 2023-12-09 DIAGNOSIS — I4891 Unspecified atrial fibrillation: Secondary | ICD-10-CM | POA: Insufficient documentation

## 2023-12-09 DIAGNOSIS — Z79899 Other long term (current) drug therapy: Secondary | ICD-10-CM | POA: Diagnosis not present

## 2023-12-09 DIAGNOSIS — T182XXA Foreign body in stomach, initial encounter: Secondary | ICD-10-CM

## 2023-12-09 DIAGNOSIS — G4733 Obstructive sleep apnea (adult) (pediatric): Secondary | ICD-10-CM | POA: Diagnosis not present

## 2023-12-09 DIAGNOSIS — Z86718 Personal history of other venous thrombosis and embolism: Secondary | ICD-10-CM | POA: Insufficient documentation

## 2023-12-09 DIAGNOSIS — R519 Headache, unspecified: Secondary | ICD-10-CM | POA: Diagnosis not present

## 2023-12-09 DIAGNOSIS — K219 Gastro-esophageal reflux disease without esophagitis: Secondary | ICD-10-CM | POA: Insufficient documentation

## 2023-12-09 DIAGNOSIS — I25119 Atherosclerotic heart disease of native coronary artery with unspecified angina pectoris: Secondary | ICD-10-CM | POA: Insufficient documentation

## 2023-12-09 DIAGNOSIS — Z7951 Long term (current) use of inhaled steroids: Secondary | ICD-10-CM | POA: Diagnosis not present

## 2023-12-09 DIAGNOSIS — Z6830 Body mass index (BMI) 30.0-30.9, adult: Secondary | ICD-10-CM | POA: Insufficient documentation

## 2023-12-09 DIAGNOSIS — K2950 Unspecified chronic gastritis without bleeding: Secondary | ICD-10-CM | POA: Insufficient documentation

## 2023-12-09 DIAGNOSIS — Z8719 Personal history of other diseases of the digestive system: Secondary | ICD-10-CM | POA: Insufficient documentation

## 2023-12-09 DIAGNOSIS — I4819 Other persistent atrial fibrillation: Secondary | ICD-10-CM | POA: Insufficient documentation

## 2023-12-09 DIAGNOSIS — E119 Type 2 diabetes mellitus without complications: Secondary | ICD-10-CM | POA: Insufficient documentation

## 2023-12-09 DIAGNOSIS — Z955 Presence of coronary angioplasty implant and graft: Secondary | ICD-10-CM | POA: Insufficient documentation

## 2023-12-09 DIAGNOSIS — M1990 Unspecified osteoarthritis, unspecified site: Secondary | ICD-10-CM | POA: Diagnosis not present

## 2023-12-09 DIAGNOSIS — J45909 Unspecified asthma, uncomplicated: Secondary | ICD-10-CM | POA: Insufficient documentation

## 2023-12-09 DIAGNOSIS — D3A8 Other benign neuroendocrine tumors: Secondary | ICD-10-CM | POA: Diagnosis present

## 2023-12-09 DIAGNOSIS — Z87891 Personal history of nicotine dependence: Secondary | ICD-10-CM | POA: Diagnosis not present

## 2023-12-09 DIAGNOSIS — Z7984 Long term (current) use of oral hypoglycemic drugs: Secondary | ICD-10-CM | POA: Insufficient documentation

## 2023-12-09 DIAGNOSIS — I456 Pre-excitation syndrome: Secondary | ICD-10-CM | POA: Diagnosis not present

## 2023-12-09 HISTORY — PX: ESOPHAGOGASTRODUODENOSCOPY: SHX5428

## 2023-12-09 LAB — GLUCOSE, CAPILLARY: Glucose-Capillary: 123 mg/dL — ABNORMAL HIGH (ref 70–99)

## 2023-12-09 SURGERY — EGD (ESOPHAGOGASTRODUODENOSCOPY)
Anesthesia: General

## 2023-12-09 MED ORDER — LIDOCAINE 2% (20 MG/ML) 5 ML SYRINGE
INTRAMUSCULAR | Status: DC | PRN
  Administered 2023-12-09: 50 mg via INTRAVENOUS

## 2023-12-09 MED ORDER — LACTATED RINGERS IV SOLN: INTRAVENOUS | Status: DC

## 2023-12-09 MED ORDER — PROPOFOL 10 MG/ML IV BOLUS
INTRAVENOUS | Status: DC | PRN
  Administered 2023-12-09: 20 mg via INTRAVENOUS
  Administered 2023-12-09: 30 mg via INTRAVENOUS
  Administered 2023-12-09: 100 mg via INTRAVENOUS
  Administered 2023-12-09: 20 mg via INTRAVENOUS
  Administered 2023-12-09: 100 mg via INTRAVENOUS

## 2023-12-09 NOTE — Transfer of Care (Signed)
 Immediate Anesthesia Transfer of Care Note  Patient: Austin Woods  Procedure(s) Performed: EGD (ESOPHAGOGASTRODUODENOSCOPY)  Patient Location: Short Stay  Anesthesia Type:General  Level of Consciousness: awake  Airway & Oxygen  Therapy: Patient Spontanous Breathing  Post-op Assessment: Report given to RN  Post vital signs: Reviewed and stable  Last Vitals:  Vitals Value Taken Time  BP 101/45 12/09/23 10:00  Temp 36.6 C 12/09/23 10:00  Pulse 72 12/09/23 10:00  Resp 19 12/09/23 10:00  SpO2 100 % 12/09/23 10:00    Last Pain:  Vitals:   12/09/23 1000  TempSrc: Oral  PainSc: 0-No pain         Complications: No notable events documented.

## 2023-12-09 NOTE — Op Note (Signed)
 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 Providers:                Toribio Fortune, Harlene Lips, Rosina Sprague Referring MD:              Medicines:                Monitored Anesthesia Care Complications:            No immediate complications. Estimated Blood Loss:     Estimated blood loss: none. Procedure:                Pre-Anesthesia Assessment:                           - Prior to the procedure, a History and Physical                            was performed, and patient medications, allergies                            and sensitivities were reviewed. The patient's                            tolerance of previous anesthesia was reviewed.                           - The risks and benefits of the procedure and the                            sedation options and risks were discussed with the                            patient. All questions were answered and informed                            consent was obtained.                           - ASA Grade Assessment: III - A patient with severe                            systemic disease.                           After obtaining informed consent, the endoscope was                            passed under direct vision. Throughout the                            procedure,  the patient's blood pressure, pulse, and                            oxygen  saturations were monitored continuously. The                            [id number] ([id number]) Upper was introduced through                            the mouth, and advanced to the second part of                            duodenum. The upper GI endoscopy was accomplished                             without difficulty. The patient tolerated the                            procedure well. Scope In: 9:41:17 AM Scope Out: 9:53:12 AM Total Procedure Duration: 0 hours 11 minutes 55 seconds  Findings:      The Z-line was regular and was found 41 cm from the incisors.      Prior Nissen fundoplication was found at the gastroesophageal junction.      Five endoclips were found in the gastric body, at the sites of previous       EMR. Biopsies from the base of one of the EMR sites were taken with a       cold forceps for histology.      Three localized 2 mm erosions with no stigmata of recent bleeding were       found in the gastric antrum. Biopsies were taken with a cold forceps for       Helicobacter pylori testing.      The examined duodenum was normal. Impression:               - Z-line regular, 41 cm from the incisors.                           - Nissen fundoplication was found.                           - 6 endoclips were found in the stomach. Biopsied                            at base of previous EMR.                           - Erosive gastropathy with no stigmata of recent                            bleeding. Biopsied.                           - Normal examined duodenum. Moderate Sedation:      Per Anesthesia Care Recommendation:           - Discharge patient to home (ambulatory).                           -  Resume previous diet.                           - Await pathology results.                           - Continue present medications.                           - Restart Xarelto  tonight. Procedure Code(s):        --- Professional ---                           [phone], Esophagogastroduodenoscopy, flexible,                            transoral; with biopsy, single or multiple Diagnosis Code(s):        --- Professional ---                           [phone], Other specified postprocedural states                           T18.2XXA, Foreign body in stomach, initial encounter                            K31.89, Other diseases of stomach and duodenum                           D49.0, Neoplasm of unspecified behavior of                            digestive system CPT copyright 2022 [employer]. All rights reserved. The codes documented in this report are preliminary and upon coder review may  be revised to meet current compliance requirements. Toribio Fortune, MD Toribio Fortune,  12/09/2023 10:07:38 AM This report has been signed electronically. Number of Addenda: 0

## 2023-12-09 NOTE — Anesthesia Postprocedure Evaluation (Signed)
 Anesthesia Post Note  Patient: Austin Woods  Procedure(s) Performed: EGD (ESOPHAGOGASTRODUODENOSCOPY)  Patient location during evaluation: Short Stay Anesthesia Type: General Level of consciousness: awake and alert Pain management: pain level controlled Vital Signs Assessment: post-procedure vital signs reviewed and stable Respiratory status: spontaneous breathing Cardiovascular status: blood pressure returned to baseline and stable Postop Assessment: no apparent nausea or vomiting Anesthetic complications: no   No notable events documented.   Last Vitals:  Vitals:   12/09/23 0756 12/09/23 1000  BP: (!) 161/76 (!) 101/45  Pulse: (!) 57 72  Resp: 18 19  Temp: 36.8 C 36.6 C  SpO2: 98% 100%    Last Pain:  Vitals:   12/09/23 1000  TempSrc: Oral  PainSc: 0-No pain                 Ferris Fielden

## 2023-12-09 NOTE — Interval H&P Note (Signed)
 History and Physical Interval Note:  12/09/2023 7:37 AM  Austin Woods  has presented today for surgery, with the diagnosis of Gastric carcinoid follow-up.  The various methods of treatment have been discussed with the patient and family. After consideration of risks, benefits and other options for treatment, the patient has consented to  Procedure(s) with comments: EGD (ESOPHAGOGASTRODUODENOSCOPY) (N/A) - 9:15 AM, ASA 3 as a surgical intervention.  The patient's history has been reviewed, patient examined, no change in status, stable for surgery.  I have reviewed the patient's chart and labs.  Questions were answered to the patient's satisfaction.     [name]

## 2023-12-09 NOTE — Discharge Instructions (Signed)
 You are being discharged to home.  Resume your previous diet.  We are waiting for your pathology results.  Continue your present medications.  Restart Xarelto  tonight.

## 2023-12-13 LAB — SURGICAL PATHOLOGY

## 2023-12-14 ENCOUNTER — Encounter (HOSPITAL_COMMUNITY): Payer: Self-pay | Admitting: Gastroenterology

## 2023-12-15 ENCOUNTER — Ambulatory Visit (INDEPENDENT_AMBULATORY_CARE_PROVIDER_SITE_OTHER): Payer: Self-pay | Admitting: Gastroenterology

## 2023-12-16 NOTE — Progress Notes (Signed)
 1 yr EGD noted in recall

## 2024-01-19 ENCOUNTER — Other Ambulatory Visit: Payer: Self-pay | Admitting: Internal Medicine

## 2024-01-19 ENCOUNTER — Encounter (INDEPENDENT_AMBULATORY_CARE_PROVIDER_SITE_OTHER): Payer: Self-pay | Admitting: Gastroenterology

## 2024-03-06 ENCOUNTER — Ambulatory Visit (INDEPENDENT_AMBULATORY_CARE_PROVIDER_SITE_OTHER): Admitting: Gastroenterology

## 2024-03-07 ENCOUNTER — Encounter (HOSPITAL_COMMUNITY)
Admission: RE | Admit: 2024-03-07 | Discharge: 2024-03-07 | Disposition: A | Source: Ambulatory Visit | Attending: Hematology | Admitting: Hematology

## 2024-03-07 DIAGNOSIS — D3A092 Benign carcinoid tumor of the stomach: Secondary | ICD-10-CM | POA: Diagnosis present

## 2024-03-07 MED ORDER — COPPER CU 64 DOTATATE 1 MCI/ML IV SOLN
4.0000 | Freq: Once | INTRAVENOUS | Status: AC
  Administered 2024-03-07: 4.23 via INTRAVENOUS

## 2024-03-10 ENCOUNTER — Encounter: Payer: Self-pay | Admitting: Cardiovascular Disease

## 2024-03-10 ENCOUNTER — Ambulatory Visit: Attending: Cardiovascular Disease | Admitting: Cardiovascular Disease

## 2024-03-10 VITALS — BP 118/70 | HR 76 | Ht 73.0 in | Wt 236.2 lb

## 2024-03-10 DIAGNOSIS — I4819 Other persistent atrial fibrillation: Secondary | ICD-10-CM | POA: Diagnosis not present

## 2024-03-10 NOTE — Patient Instructions (Signed)
 Medication Instructions:  Continue all current medications.   Labwork: none  Testing/Procedures: none  Follow-Up: 6 months   Any Other Special Instructions Will Be Listed Below (If Applicable).   If you need a refill on your cardiac medications before your next appointment, please call your pharmacy.

## 2024-03-10 NOTE — Progress Notes (Signed)
  Electrophysiology Office Note:    Date:  03/10/2024   ID:  JERRIK HOUSHOLDER, DOB [birth year], MRN [id number]  PCP:  Vida Mardy DEL PA-C   [facility] Providers Cardiologist:  Diannah SHAUNNA Maywood, MD Electrophysiologist:  Eulas FORBES Furbish, MD     Referring MD: Vida Mardy DEL, PA-C   History of Present Illness:    Austin Woods is a 58 y.o. male with a medical history significant for persistent atrial fibrillation, obesity, obstructive sleep apnea, who presents for electrophysiology follow-up.     Patient has a history of atrial fibrillation and has undergone ablation on 2 occasions.  He has since been started on Tikosyn  and has been maintaining sinus rhythm.  Recalls that he was taken off Tikosyn  for brief period in the past but immediately had recurrence of atrial fibrillation.  He is symptomatic with rapid rates and palpitations when he does have atrial fibrillation.      Today, he reports that he is doing very well from a rhythm standpoint.  He has not had any recurrence of rapid rates or palpitations while on Tikosyn . No changes from his perspective since our last visit.  EKGs/Labs/Other Studies Reviewed Today:    Echocardiogram:  TTE June 05, 2020 LVEF 60 to 65%.  Left atrium normal in size   Monitors:  01/14/2023   Patch wear time was for 3 days and 14 hours.   Normal sinus rhythm predominantly ranging from 45 to 162 bpm with an average HR 70 bpm.   2 brief runs of SVT, fastest lasting 1.8 seconds and the longest lasting 4.3 seconds.  No ventricular arrhythmias.   No AV block or pauses.   4.8 % PAC burden and <1% PVC burden.   No patient triggered events.  Stress testing:  Reviewed in Epic  Advanced imaging:   Cardiac catherization   EKG:   EKG Interpretation Date/Time:  Friday March 10 2024 15:35:18 EST Ventricular Rate:  67 PR Interval:  160 QRS Duration:  98 QT Interval:  426 QTC Calculation: 450 R Axis:   2  Text  Interpretation: Sinus rhythm with Premature supraventricular complexes Possible Lateral infarct , age undetermined When compared with ECG of 20-Oct-2023 15:50, Premature supraventricular complexes are now Present Confirmed by Furbish Eulas [phone]) on 03/10/2024 3:53:09 PM     Physical Exam:    VS:  BP 118/70 (BP Location: Left Arm, Cuff Size: Normal)   Pulse 76   Ht 6' 1 (1.854 m)   Wt 236 lb 3.2 oz (107.1 kg)   SpO2 97%   BMI 31.16 kg/m     Wt Readings from Last 3 Encounters:  03/10/24 236 lb 3.2 oz (107.1 kg)  12/09/23 227 lb 12.8 oz (103.3 kg)  12/08/23 227 lb 12.8 oz (103.3 kg)     GEN: Well nourished, well developed in no acute distress CARDIAC: RRR, no murmurs, rubs, gallops RESPIRATORY:  Normal work of breathing MUSCULOSKELETAL: no edema    ASSESSMENT & PLAN:    Paroxysmal atrial fibrillation Status post AF ablation x2 Currently on Tikosyn , maintaining sinus rhythm   High risk medication-Tikosyn  On 500 mcg twice daily EKG reviewed today; QTc is within acceptable limits BMP 11/26/23 reviewed today -- Cr Normal Pt will follow up in 6 months for ongoing monitoring  Secondary hypercoagulable state Continue Xarelto  20 mg with evening meal      Signed, Eulas FORBES Furbish, MD  03/10/2024 3:54 PM     [facility]

## 2024-03-14 ENCOUNTER — Inpatient Hospital Stay: Attending: Oncology

## 2024-03-14 DIAGNOSIS — Z79899 Other long term (current) drug therapy: Secondary | ICD-10-CM | POA: Insufficient documentation

## 2024-03-14 DIAGNOSIS — R978 Other abnormal tumor markers: Secondary | ICD-10-CM | POA: Insufficient documentation

## 2024-03-14 DIAGNOSIS — D3A092 Benign carcinoid tumor of the stomach: Secondary | ICD-10-CM | POA: Insufficient documentation

## 2024-03-15 ENCOUNTER — Inpatient Hospital Stay

## 2024-03-15 DIAGNOSIS — D3A092 Benign carcinoid tumor of the stomach: Secondary | ICD-10-CM

## 2024-03-15 DIAGNOSIS — R978 Other abnormal tumor markers: Secondary | ICD-10-CM | POA: Diagnosis not present

## 2024-03-15 DIAGNOSIS — Z79899 Other long term (current) drug therapy: Secondary | ICD-10-CM | POA: Diagnosis not present

## 2024-03-15 LAB — HEPATIC FUNCTION PANEL
ALT: 17 U/L (ref 0–44)
AST: 22 U/L (ref 15–41)
Albumin: 4.3 g/dL (ref 3.5–5.0)
Alkaline Phosphatase: 44 U/L (ref 38–126)
Bilirubin, Direct: 0.5 mg/dL — ABNORMAL HIGH (ref 0.0–0.2)
Indirect Bilirubin: 0.4 mg/dL (ref 0.3–0.9)
Total Bilirubin: 0.8 mg/dL (ref 0.0–1.2)
Total Protein: 6.8 g/dL (ref 6.5–8.1)

## 2024-03-17 LAB — CHROMOGRANIN A: Chromogranin A (ng/mL): 293.6 ng/mL — ABNORMAL HIGH (ref 0.0–101.8)

## 2024-03-17 LAB — GASTRIN: Gastrin: 357 pg/mL — ABNORMAL HIGH (ref 0–115)

## 2024-03-21 ENCOUNTER — Inpatient Hospital Stay: Admitting: Oncology

## 2024-03-21 VITALS — BP 115/56 | HR 76 | Temp 97.6°F | Resp 16 | Wt 234.3 lb

## 2024-03-21 DIAGNOSIS — D3A092 Benign carcinoid tumor of the stomach: Secondary | ICD-10-CM | POA: Diagnosis not present

## 2024-03-21 DIAGNOSIS — D3A8 Other benign neuroendocrine tumors: Secondary | ICD-10-CM

## 2024-03-21 NOTE — Progress Notes (Signed)
 [facility] OFFICE PROGRESS NOTE  Vida Mardy DEL, PA-C  ASSESSMENT & PLAN:   Assessment & Plan Neuroendocrine tumor [facility]) - Last underwent EGD/EUS and biopsy on 02/22/2023.  Pathology did not show neuroendocrine tumor.  It showed hyperplastic polyp. - He denies any abdominal pain, flushing/wheezing/diarrhea. - He was started on dexlansoprazole  on 07/20/2023. - I reviewed labs from 03/15/2024 which showed chromogranin a level of 293.6 (1441) and improvement of gastrin levels 357 previously 1808. -Previously elevated chromogranin A and gastrin levels were thought to be due to dexlansoprazole . - Reviewed images of dotatate PET scan 03/07/2024 which did not reveal evidence of neuroendocrine tumor within the stomach and no evidence of metastatic well-differentiated neuroendocrine tumor. -Recommend follow-up annually with labs and dotatate PET.  Orders Placed This Encounter  Procedures   NM PET DOTATATE SKULL BASE TO MID THIGH    Standing Status:   Future    Expected Date:   03/21/2025    Expiration Date:   06/19/2025    If indicated for the ordered procedure, I authorize the administration of a radiopharmaceutical per Radiology protocol:   Yes    Preferred imaging location?:   [facility]    Release to patient:   Immediate   Hepatic function panel    Standing Status:   Future    Expected Date:   03/21/2025    Expiration Date:   06/19/2025   Gastrin    Standing Status:   Future    Expected Date:   03/21/2025    Expiration Date:   06/19/2025   Chromogranin A    Standing Status:   Future    Expected Date:   03/21/2025    Expiration Date:   06/19/2025    INTERVAL HISTORY: Patient returns for follow-up for neuroendocrine tumor.  In the interim, patient had EGD on 12/09/2023 which showed 6 endoclips from previous biopsies, erosive gastropathy with no stigmata of recent bleeding.  He underwent a PET scan in 09/24/2023 which showed some activity in the stomach.  He continues to  experience constant reflux.  He has been taking Dexilant  60 mg daily which does not relieve his symptoms.  He has a small hiatal hernia.  He has some intentional weight loss here recently greater than 10 pounds after starting Mounjaro.  He used to weigh 400 pounds but now weighs approximately 230.  Reports persistent abdominal distention.  He met with general surgery with [facility] to discuss additional gastric resection but they have opted to monitor for now.  Patient also has history of atrial fibrillation, obesity and sleep apnea and is followed by [facility].  He has had atrial fibrillation ablation x 2.  He is currently on Tikosyn  maintaining sinus rhythm.  Reports an appetite of 100% energy levels are 50%.  He has chronic acid reflux and says this is his biggest complaint right now.  He has cough due to allergies and shortness of breath at times.  Has palpitations secondary to atrial fibrillation and rotating constipation and diarrhea.  He has insomnia secondary to acid reflux and the inability to use his CPAP machine.  This is also contributing to his fatigue.  We reviewed gastrin levels, chromogranin A and hepatic function panel.  Patient recently had dotatate PET scan.  SUMMARY OF HEMATOLOGIC HISTORY: Oncology History   No problem history exists.   1.  Gastric carcinoid tumor: - Has history of GERD for 20+ years.  Underwent Nissen's fundoplication 20 years ago [facility]. - No B symptoms.  No flushing.  Has wheezing from asthma and uses Advair inhaler daily.  Reports soft stools for the past 1 year. - EGD and gastric nodule biopsy (04/16/2022): Well-differentiated neuroendocrine tumor.  Transitional type mucosa with mild PPI effect in the stomach.  Ki-67 3-10%.  Overall grade 1 well-differentiated neuroendocrine tumor. - EGD/EUS (07/16/2022): 3 5-7 mm submucosal nodules with no bleeding and no stigmata of recent bleeding were found in the anterior wall of the gastric body  (between 50-52 cm from incisors).  Status post endoscopic mucosal resection.  No malignant lymph nodes were visualized in the paracardial, left gastric and gastrohepatic ligament and celiac region by EUS. 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  Stomach nodule #3 biopsy shows fundic gland polyp with linear and focal nodular neuroendocrine cell hyperplasia.  No malignancy identified.  Stomach biopsy also showed gastric antral and oxyntic mucosa with features of reactive gastropathy, negative for H. pylori.  Ki-67 was less than 3%.  Tumor cells positive for synaptophysin and CD56. - Gastrin level: 880 (08/28/2022), 423 (06/09/2022) - Chromogranin A (06/09/2022): 1228, 1602 on 08/28/2022. - Dotatate PET scan (10/12/2022): Increased uptake within the wall of the proximal stomach just below the GE junction with SUV 8.9.  No abdominal pelvic lymphadenopathy.  Increased uptake in the prostate gland with SUV 8.7.  New indeterminate nodule within the right lung base without increased uptake measuring 1 cm.   2.  Social/family history: - Lives at home with his wife.  Works as a engineer, petroleum at [employer].  Quit smoking 12 years ago.  Smoked half pack per day for 3 years. - No family history of malignancy.  CBC    Component Value Date/Time   WBC 7.6 01/29/2020 0930   WBC 8.6 08/07/2019 1250   RBC 4.76 01/29/2020 0930   RBC 5.38 08/07/2019 1250   HGB 13.5 01/29/2020 0930   HCT 42.1 01/29/2020 0930   PLT 357 01/29/2020 0930   MCV 88 01/29/2020 0930   MCH 28.4 01/29/2020 0930   MCH 26.8 08/07/2019 1250   MCHC 32.1 01/29/2020 0930   MCHC 30.8 08/07/2019 1250   RDW 14.0 01/29/2020 0930   LYMPHSABS 1.0 08/07/2019 1250   LYMPHSABS 1.8 11/27/2016 1602   MONOABS 0.5 08/07/2019 1250   EOSABS 0.0 08/07/2019 1250   EOSABS 0.1 11/27/2016 1602   BASOSABS 0.0 08/07/2019  1250   BASOSABS 0.0 11/27/2016 1602       Latest Ref Rng & Units 03/15/2024    7:42 AM 11/26/2023   12:24 PM 09/15/2023    2:20 PM  CMP  Glucose 70 - 99 mg/dL  753    BUN 8 - 27 mg/dL  21    Creatinine 9.23 - 1.27 mg/dL  8.89    Sodium 865 - 855 mmol/L  143    Potassium 3.5 - 5.2 mmol/L  4.7    Chloride 96 - 106 mmol/L  106    CO2 20 - 29 mmol/L  19    Calcium  8.6 - 10.2 mg/dL  9.0    Total Protein 6.5 - 8.1 g/dL 6.8   6.6   Total Bilirubin 0.0 - 1.2 mg/dL 0.8   1.2   Alkaline Phos 38 - 126 U/L 44   41   AST 15 - 41 U/L 22   21   ALT 0 - 44 U/L 17  17      Lab Results  Component Value Date   FERRITIN 12 (L) 01/29/2020   VITAMINB12 265 02/13/2019    There were no vitals filed for this visit.  Review of System:  Review of Systems  Constitutional:  Positive for malaise/fatigue and weight loss.  Respiratory:  Positive for cough and shortness of breath.   Cardiovascular:  Positive for palpitations.  Gastrointestinal:  Positive for abdominal pain, constipation and diarrhea.  Neurological:  Positive for headaches.  Psychiatric/Behavioral:  The patient has insomnia.     Physical Exam: Physical Exam Constitutional:      Appearance: Normal appearance.  HENT:     Head: Normocephalic and atraumatic.  Eyes:     Pupils: Pupils are equal, round, and reactive to light.  Cardiovascular:     Rate and Rhythm: Normal rate and regular rhythm.     Heart sounds: Normal heart sounds. No murmur heard. Pulmonary:     Effort: Pulmonary effort is normal.     Breath sounds: Normal breath sounds. No wheezing.  Abdominal:     General: Bowel sounds are normal. There is no distension.     Palpations: Abdomen is soft.     Tenderness: There is no abdominal tenderness.  Musculoskeletal:        General: Normal range of motion.     Cervical back: Normal range of motion.  Skin:    General: Skin is warm and dry.     Findings: No rash.  Neurological:     Mental Status: He is alert and  oriented to person, place, and time.     Gait: Gait is intact.  Psychiatric:        Mood and Affect: Mood and affect normal.        Cognition and Memory: Memory normal.        Judgment: Judgment normal.      I spent 20 minutes dedicated to the care of this patient (face-to-face and non-face-to-face) on the date of the encounter to include what is described in the assessment and plan.,  Delon Hope, NP 03/21/2024 1:25 PM

## 2024-03-21 NOTE — Assessment & Plan Note (Addendum)
-   Last underwent EGD/EUS and biopsy on 02/22/2023.  Pathology did not show neuroendocrine tumor.  It showed hyperplastic polyp. - He denies any abdominal pain, flushing/wheezing/diarrhea. - He was started on dexlansoprazole  on 07/20/2023. - I reviewed labs from 03/15/2024 which showed chromogranin a level of 293.6 (1441) and improvement of gastrin levels 357 previously 1808. -Previously elevated chromogranin A and gastrin levels were thought to be due to dexlansoprazole . - Reviewed images of dotatate PET scan 03/07/2024 which did not reveal evidence of neuroendocrine tumor within the stomach and no evidence of metastatic well-differentiated neuroendocrine tumor. -Recommend follow-up annually with labs and dotatate PET.

## 2024-03-30 ENCOUNTER — Other Ambulatory Visit: Payer: Self-pay | Admitting: Cardiovascular Disease

## 2024-03-30 DIAGNOSIS — I4819 Other persistent atrial fibrillation: Secondary | ICD-10-CM

## 2024-03-31 NOTE — Telephone Encounter (Signed)
 Prescription refill request for Xarelto  received.  Indication: a fib Last office visit: 03/10/24 Weight: 234# Age: 58 Scr: 1.1 epic 11/26/23 CrCl: 107 ml/min

## 2024-04-03 ENCOUNTER — Encounter: Payer: Self-pay | Admitting: *Deleted

## 2024-04-12 ENCOUNTER — Other Ambulatory Visit: Payer: Self-pay | Admitting: Cardiovascular Disease

## 2025-03-07 ENCOUNTER — Inpatient Hospital Stay

## 2025-03-15 ENCOUNTER — Inpatient Hospital Stay: Admitting: Oncology
